# Patient Record
Sex: Male | Born: 1935 | Race: White | Hispanic: No | Smoking: Former smoker
Health system: Southern US, Community
[De-identification: ages and names within clinical notes are randomized; demographics above are authoritative.]

## PROBLEM LIST (undated history)

## (undated) DIAGNOSIS — R011 Cardiac murmur, unspecified: Secondary | ICD-10-CM

## (undated) DIAGNOSIS — I700 Atherosclerosis of aorta: Secondary | ICD-10-CM

## (undated) DIAGNOSIS — C4490 Unspecified malignant neoplasm of skin, unspecified: Secondary | ICD-10-CM

## (undated) DIAGNOSIS — I77810 Thoracic aortic ectasia: Secondary | ICD-10-CM

## (undated) DIAGNOSIS — H3412 Central retinal artery occlusion, left eye: Secondary | ICD-10-CM

## (undated) DIAGNOSIS — I480 Paroxysmal atrial fibrillation: Secondary | ICD-10-CM

## (undated) DIAGNOSIS — I4510 Unspecified right bundle-branch block: Secondary | ICD-10-CM

## (undated) DIAGNOSIS — I509 Heart failure, unspecified: Secondary | ICD-10-CM

## (undated) DIAGNOSIS — U071 COVID-19: Secondary | ICD-10-CM

## (undated) DIAGNOSIS — J690 Pneumonitis due to inhalation of food and vomit: Secondary | ICD-10-CM

## (undated) DIAGNOSIS — I517 Cardiomegaly: Secondary | ICD-10-CM

## (undated) DIAGNOSIS — Z7952 Long term (current) use of systemic steroids: Secondary | ICD-10-CM

## (undated) DIAGNOSIS — I739 Peripheral vascular disease, unspecified: Secondary | ICD-10-CM

## (undated) DIAGNOSIS — N1831 Chronic kidney disease, stage 3a: Secondary | ICD-10-CM

## (undated) DIAGNOSIS — I2510 Atherosclerotic heart disease of native coronary artery without angina pectoris: Secondary | ICD-10-CM

## (undated) DIAGNOSIS — M869 Osteomyelitis, unspecified: Secondary | ICD-10-CM

## (undated) DIAGNOSIS — T8859XA Other complications of anesthesia, initial encounter: Secondary | ICD-10-CM

## (undated) DIAGNOSIS — J449 Chronic obstructive pulmonary disease, unspecified: Secondary | ICD-10-CM

## (undated) DIAGNOSIS — I10 Essential (primary) hypertension: Secondary | ICD-10-CM

## (undated) DIAGNOSIS — E785 Hyperlipidemia, unspecified: Secondary | ICD-10-CM

## (undated) DIAGNOSIS — J1282 Pneumonia due to coronavirus disease 2019: Secondary | ICD-10-CM

## (undated) DIAGNOSIS — Z9981 Dependence on supplemental oxygen: Secondary | ICD-10-CM

## (undated) DIAGNOSIS — I6521 Occlusion and stenosis of right carotid artery: Secondary | ICD-10-CM

## (undated) DIAGNOSIS — A419 Sepsis, unspecified organism: Secondary | ICD-10-CM

## (undated) DIAGNOSIS — Z7901 Long term (current) use of anticoagulants: Secondary | ICD-10-CM

## (undated) DIAGNOSIS — I5030 Unspecified diastolic (congestive) heart failure: Secondary | ICD-10-CM

## (undated) DIAGNOSIS — I4891 Unspecified atrial fibrillation: Secondary | ICD-10-CM

## (undated) HISTORY — PX: OTHER SURGICAL HISTORY: SHX169

## (undated) HISTORY — DX: Heart failure, unspecified: I50.9

## (undated) HISTORY — DX: Chronic obstructive pulmonary disease, unspecified: J44.9

---

## 1998-01-28 HISTORY — PX: OTHER SURGICAL HISTORY: SHX169

## 1999-12-04 ENCOUNTER — Encounter: Payer: Self-pay | Admitting: *Deleted

## 1999-12-05 ENCOUNTER — Encounter: Payer: Self-pay | Admitting: *Deleted

## 1999-12-05 ENCOUNTER — Inpatient Hospital Stay (HOSPITAL_COMMUNITY): Admission: RE | Admit: 1999-12-05 | Discharge: 1999-12-11 | Payer: Self-pay | Admitting: *Deleted

## 1999-12-06 ENCOUNTER — Encounter: Payer: Self-pay | Admitting: *Deleted

## 1999-12-08 ENCOUNTER — Encounter: Payer: Self-pay | Admitting: *Deleted

## 2003-01-29 HISTORY — PX: EYE SURGERY: SHX253

## 2003-01-29 HISTORY — PX: CATARACT EXTRACTION, BILATERAL: SHX1313

## 2004-09-03 ENCOUNTER — Ambulatory Visit: Payer: Self-pay | Admitting: Ophthalmology

## 2004-09-10 ENCOUNTER — Ambulatory Visit: Payer: Self-pay | Admitting: Ophthalmology

## 2005-01-15 ENCOUNTER — Other Ambulatory Visit: Payer: Self-pay

## 2005-01-15 ENCOUNTER — Ambulatory Visit: Payer: Self-pay | Admitting: Ophthalmology

## 2005-01-23 ENCOUNTER — Ambulatory Visit: Payer: Self-pay | Admitting: Ophthalmology

## 2005-01-28 HISTORY — PX: COLONOSCOPY: SHX174

## 2007-09-24 ENCOUNTER — Ambulatory Visit: Payer: Self-pay | Admitting: Unknown Physician Specialty

## 2011-06-25 ENCOUNTER — Emergency Department: Payer: Self-pay | Admitting: Emergency Medicine

## 2011-06-25 LAB — CK TOTAL AND CKMB (NOT AT ARMC)
CK, Total: 138 U/L (ref 35–232)
CK-MB: 2.8 ng/mL (ref 0.5–3.6)

## 2011-06-25 LAB — COMPREHENSIVE METABOLIC PANEL
Albumin: 4.3 g/dL (ref 3.4–5.0)
Alkaline Phosphatase: 80 U/L (ref 50–136)
Anion Gap: 10 (ref 7–16)
BUN: 24 mg/dL — ABNORMAL HIGH (ref 7–18)
Bilirubin,Total: 0.5 mg/dL (ref 0.2–1.0)
Calcium, Total: 9.6 mg/dL (ref 8.5–10.1)
Chloride: 99 mmol/L (ref 98–107)
Co2: 26 mmol/L (ref 21–32)
Creatinine: 1.08 mg/dL (ref 0.60–1.30)
EGFR (African American): >60
EGFR (Non-African Amer.): >60
Glucose: 113 mg/dL — ABNORMAL HIGH (ref 65–99)
Osmolality: 275 (ref 275–301)
Potassium: 4 mmol/L (ref 3.5–5.1)
SGOT(AST): 32 U/L (ref 15–37)
SGPT (ALT): 23 U/L
Sodium: 135 mmol/L — ABNORMAL LOW (ref 136–145)
Total Protein: 7.8 g/dL (ref 6.4–8.2)

## 2011-06-25 LAB — CBC
HCT: 39.7 % — ABNORMAL LOW (ref 40.0–52.0)
HGB: 13.1 g/dL (ref 13.0–18.0)
MCH: 31.3 pg (ref 26.0–34.0)
MCHC: 33.1 g/dL (ref 32.0–36.0)
MCV: 95 fL (ref 80–100)
Platelet: 256 10*3/uL (ref 150–440)
RBC: 4.2 10*6/uL — ABNORMAL LOW (ref 4.40–5.90)
RDW: 13 % (ref 11.5–14.5)
WBC: 15.7 10*3/uL — ABNORMAL HIGH (ref 3.8–10.6)

## 2011-06-25 LAB — TROPONIN I: Troponin-I: <0.02 ng/mL

## 2014-01-29 ENCOUNTER — Inpatient Hospital Stay: Payer: Self-pay

## 2014-01-29 LAB — TROPONIN I
Troponin-I: 0.07 ng/mL — ABNORMAL HIGH
Troponin-I: 0.07 ng/mL — ABNORMAL HIGH
Troponin-I: 0.06 ng/mL — ABNORMAL HIGH

## 2014-01-29 LAB — CBC
HCT: 36 % — ABNORMAL LOW (ref 40.0–52.0)
HGB: 11.9 g/dL — ABNORMAL LOW (ref 13.0–18.0)
MCH: 31.7 pg (ref 26.0–34.0)
MCHC: 33.1 g/dL (ref 32.0–36.0)
MCV: 96 fL (ref 80–100)
Platelet: 218 10*3/uL (ref 150–440)
RBC: 3.76 10*6/uL — ABNORMAL LOW (ref 4.40–5.90)
RDW: 12.9 % (ref 11.5–14.5)
WBC: 13.2 10*3/uL — ABNORMAL HIGH (ref 3.8–10.6)

## 2014-01-29 LAB — COMPREHENSIVE METABOLIC PANEL
ALBUMIN: 3.1 g/dL — AB (ref 3.4–5.0)
ANION GAP: 11 (ref 7–16)
Alkaline Phosphatase: 79 U/L
BILIRUBIN TOTAL: 0.7 mg/dL (ref 0.2–1.0)
BUN: 36 mg/dL — AB (ref 7–18)
CHLORIDE: 102 mmol/L (ref 98–107)
CREATININE: 1.19 mg/dL (ref 0.60–1.30)
Calcium, Total: 8.6 mg/dL (ref 8.5–10.1)
Co2: 25 mmol/L (ref 21–32)
EGFR (Non-African Amer.): >60
Glucose: 149 mg/dL — ABNORMAL HIGH (ref 65–99)
Osmolality: 287 (ref 275–301)
POTASSIUM: 3.6 mmol/L (ref 3.5–5.1)
SGOT(AST): 39 U/L — ABNORMAL HIGH (ref 15–37)
SGPT (ALT): 27 U/L
SODIUM: 138 mmol/L (ref 136–145)
TOTAL PROTEIN: 7.5 g/dL (ref 6.4–8.2)

## 2014-01-29 LAB — CK TOTAL AND CKMB (NOT AT ARMC)
CK, Total: 305 U/L (ref 39–308)
CK-MB: 2.6 ng/mL (ref 0.5–3.6)

## 2014-01-29 LAB — PRO B NATRIURETIC PEPTIDE: B-Type Natriuretic Peptide: 8266 pg/mL — ABNORMAL HIGH (ref 0–450)

## 2014-01-29 LAB — PROTIME-INR
INR: 1.2
Prothrombin Time: 14.7 secs (ref 11.5–14.7)

## 2014-01-29 LAB — MAGNESIUM: MAGNESIUM: 2.1 mg/dL

## 2014-01-29 LAB — TSH: THYROID STIMULATING HORM: 2.67 u[IU]/mL

## 2014-01-29 LAB — APTT: Activated PTT: 30.2 secs (ref 23.6–35.9)

## 2014-01-29 LAB — HEPARIN LEVEL (UNFRACTIONATED): Anti-Xa(Unfractionated): 0.23 IU/mL — ABNORMAL LOW (ref 0.30–0.70)

## 2014-01-30 LAB — CBC WITH DIFFERENTIAL/PLATELET
BASOS ABS: 0 10*3/uL (ref 0.0–0.1)
BASOS PCT: 0.1 %
Eosinophil #: 0 10*3/uL (ref 0.0–0.7)
Eosinophil %: 0 %
HCT: 35.2 % — ABNORMAL LOW (ref 40.0–52.0)
HGB: 11.6 g/dL — ABNORMAL LOW (ref 13.0–18.0)
LYMPHS PCT: 9.1 %
Lymphocyte #: 1 10*3/uL (ref 1.0–3.6)
MCH: 31.6 pg (ref 26.0–34.0)
MCHC: 33.1 g/dL (ref 32.0–36.0)
MCV: 96 fL (ref 80–100)
MONOS PCT: 11.4 %
Monocyte #: 1.2 x10 3/mm — ABNORMAL HIGH (ref 0.2–1.0)
Neutrophil #: 8.3 10*3/uL — ABNORMAL HIGH (ref 1.4–6.5)
Neutrophil %: 79.4 %
Platelet: 242 10*3/uL (ref 150–440)
RBC: 3.68 10*6/uL — AB (ref 4.40–5.90)
RDW: 13 % (ref 11.5–14.5)
WBC: 10.5 10*3/uL (ref 3.8–10.6)

## 2014-01-30 LAB — BASIC METABOLIC PANEL
ANION GAP: 8 (ref 7–16)
BUN: 36 mg/dL — AB (ref 7–18)
CHLORIDE: 102 mmol/L (ref 98–107)
CREATININE: 1.07 mg/dL (ref 0.60–1.30)
Calcium, Total: 8.5 mg/dL (ref 8.5–10.1)
Co2: 28 mmol/L (ref 21–32)
GLUCOSE: 144 mg/dL — AB (ref 65–99)
OSMOLALITY: 287 (ref 275–301)
Potassium: 3.3 mmol/L — ABNORMAL LOW (ref 3.5–5.1)
Sodium: 138 mmol/L (ref 136–145)

## 2014-01-30 LAB — LIPID PANEL
CHOLESTEROL: 115 mg/dL (ref 0–200)
HDL: 29 mg/dL — AB (ref 40–60)
LDL CHOLESTEROL, CALC: 68 mg/dL (ref 0–100)
Triglycerides: 92 mg/dL (ref 0–200)
VLDL CHOLESTEROL, CALC: 18 mg/dL (ref 5–40)

## 2014-01-30 LAB — HEPARIN LEVEL (UNFRACTIONATED): ANTI-XA(UNFRACTIONATED): 0.33 [IU]/mL (ref 0.30–0.70)

## 2014-01-31 LAB — CBC WITH DIFFERENTIAL/PLATELET
BASOS PCT: 0.1 %
Basophil #: 0 10*3/uL (ref 0.0–0.1)
EOS ABS: 0 10*3/uL (ref 0.0–0.7)
Eosinophil %: 0 %
HCT: 34 % — AB (ref 40.0–52.0)
HGB: 11.2 g/dL — ABNORMAL LOW (ref 13.0–18.0)
LYMPHS ABS: 1.6 10*3/uL (ref 1.0–3.6)
LYMPHS PCT: 12.7 %
MCH: 31.1 pg (ref 26.0–34.0)
MCHC: 32.8 g/dL (ref 32.0–36.0)
MCV: 95 fL (ref 80–100)
Monocyte #: 1.5 x10 3/mm — ABNORMAL HIGH (ref 0.2–1.0)
Monocyte %: 12.2 %
NEUTROS ABS: 9.2 10*3/uL — AB (ref 1.4–6.5)
Neutrophil %: 75 %
Platelet: 283 10*3/uL (ref 150–440)
RBC: 3.59 10*6/uL — ABNORMAL LOW (ref 4.40–5.90)
RDW: 13.3 % (ref 11.5–14.5)
WBC: 12.3 10*3/uL — ABNORMAL HIGH (ref 3.8–10.6)

## 2014-01-31 LAB — BASIC METABOLIC PANEL
ANION GAP: 8 (ref 7–16)
BUN: 46 mg/dL — ABNORMAL HIGH (ref 7–18)
CO2: 30 mmol/L (ref 21–32)
CREATININE: 1.05 mg/dL (ref 0.60–1.30)
Calcium, Total: 8.4 mg/dL — ABNORMAL LOW (ref 8.5–10.1)
Chloride: 101 mmol/L (ref 98–107)
EGFR (African American): >60
EGFR (Non-African Amer.): >60
Glucose: 118 mg/dL — ABNORMAL HIGH (ref 65–99)
OSMOLALITY: 291 (ref 275–301)
Potassium: 3 mmol/L — ABNORMAL LOW (ref 3.5–5.1)
Sodium: 139 mmol/L (ref 136–145)

## 2014-01-31 LAB — HEPARIN LEVEL (UNFRACTIONATED): ANTI-XA(UNFRACTIONATED): 0.27 [IU]/mL — AB (ref 0.30–0.70)

## 2014-01-31 LAB — POTASSIUM: Potassium: 3.5 mmol/L (ref 3.5–5.1)

## 2014-01-31 LAB — MAGNESIUM: Magnesium: 2.2 mg/dL

## 2014-02-01 LAB — BASIC METABOLIC PANEL
ANION GAP: 6 — AB (ref 7–16)
BUN: 41 mg/dL — ABNORMAL HIGH (ref 7–18)
CHLORIDE: 102 mmol/L (ref 98–107)
CO2: 32 mmol/L (ref 21–32)
CREATININE: 1.05 mg/dL (ref 0.60–1.30)
Calcium, Total: 8.7 mg/dL (ref 8.5–10.1)
EGFR (Non-African Amer.): >60
Glucose: 108 mg/dL — ABNORMAL HIGH (ref 65–99)
OSMOLALITY: 290 (ref 275–301)
Potassium: 3.5 mmol/L (ref 3.5–5.1)
Sodium: 140 mmol/L (ref 136–145)

## 2014-02-01 LAB — EXPECTORATED SPUTUM ASSESSMENT W REFEX TO RESP CULTURE

## 2014-02-02 LAB — BASIC METABOLIC PANEL
Anion Gap: 7 (ref 7–16)
BUN: 33 mg/dL — ABNORMAL HIGH (ref 7–18)
CREATININE: 0.86 mg/dL (ref 0.60–1.30)
Calcium, Total: 8.7 mg/dL (ref 8.5–10.1)
Chloride: 104 mmol/L (ref 98–107)
Co2: 29 mmol/L (ref 21–32)
EGFR (African American): >60
Glucose: 160 mg/dL — ABNORMAL HIGH (ref 65–99)
Osmolality: 290 (ref 275–301)
Potassium: 3.7 mmol/L (ref 3.5–5.1)
Sodium: 140 mmol/L (ref 136–145)

## 2014-02-03 ENCOUNTER — Encounter: Payer: Self-pay | Admitting: Internal Medicine

## 2014-02-03 LAB — CULTURE, BLOOD (SINGLE)

## 2014-02-28 ENCOUNTER — Encounter: Payer: Self-pay | Admitting: Internal Medicine

## 2014-03-29 ENCOUNTER — Encounter
Admit: 2014-03-29 | Disposition: A | Discharge status: Home / Self Care | Payer: Self-pay | Attending: Internal Medicine | Admitting: Internal Medicine

## 2014-04-29 ENCOUNTER — Encounter
Admit: 2014-04-29 | Disposition: A | Discharge status: Home / Self Care | Payer: Self-pay | Attending: Internal Medicine | Admitting: Internal Medicine

## 2014-05-25 NOTE — H&P (Signed)
PATIENT NAME:  [name], [name] MR#:  [id number] DATE OF BIRTH:  [birth year]  DATE OF ADMISSION:  01/29/2014  PRIMARY CARE PHYSICIAN: John B. Sarina Ser, MD   CHIEF COMPLAINT: Palpitations.   HISTORY OF PRESENT ILLNESS: This is an 81 year old man who has been having a runny nose for a few days, been taking some allergy pills, loratadine. He noticed his heart rate becoming faster. He has been short of breath for lying down and walking for the last 2 days and 2 nights. He had a temperature of 100.3, decreased appetite, not drinking much. He had a slight episode of chest pain a few days ago, but did not last long, only seconds at a time. In the ER, he was found to be rapid atrial fibrillation at 150 beats per minute. He normally takes metoprolol at home but did not take it this morning. Chest x-ray looked like fluid in the lungs and an elevated BNP. Hospitalist services were contacted for further evaluation.   PAST MEDICAL HISTORY: Hypertension, peripheral vascular disease, hyperlipidemia.   PAST SURGICAL HISTORY: A vascular procedure in the abdomen, likely some sort of bypass surgery.   ALLERGIES: No known drug allergies.   MEDICATIONS: Include amlodipine 10 mg daily, aspirin 81 mg daily, metoprolol ER 50 mg daily, simvastatin 40 mg at bedtime.   SOCIAL HISTORY: Quit smoking in 1996, smoked all of his life prior to that. No alcohol. No drug use. Used to work at [employer], and [employer].   FAMILY HISTORY: Both parents died at an elderly age, father in his late 38s or 70s and mother at age 68, unknown cause for both of them. He thinks that they just died of old age.   REVIEW OF SYSTEMS: CONSTITUTIONAL: Positive for fever and sweating last night. No chills. No weight loss. No weight gain. Does have fatigue.  EARS, NOSE, MOUTH AND THROAT: Positive for runny nose. Positive for sore throat.  CARDIOVASCULAR: No chest pain. Positive for palpitations.  RESPIRATORY: Positive for shortness of  breath, coughing, yellow phlegm.  GASTROINTESTINAL: No nausea. No vomiting. No abdominal pain. No diarrhea. No constipation. No bright red blood per rectum. No melena.  GENITOURINARY: No burning on urination. No hematuria.  MUSCULOSKELETAL: No joint pain or muscle pain.  INTEGUMENT: No rashes. Occasional itching.  NEUROLOGIC: No fainting or blackouts.  PSYCHIATRIC: No anxiety or depression.  ENDOCRINE: No thyroid problems.  HEMATOLOGIC AND LYMPHATIC: No anemia. No easy bruising or bleeding.   PHYSICAL EXAMINATION: VITAL SIGNS: On presentation included a temperature of 100.3, pulse 148, respirations 28, blood pressure 116/100, pulse oximetry 83% on room air.  GENERAL: Slight respiratory distress, using accessory muscles.  EYES: Conjunctivae and lids normal. Pupils equal, round, and reactive to light. Extraocular muscles intact. No nystagmus.  EARS, NOSE, MOUTH AND THROAT: Tympanic membranes, no erythema. Nasal mucosa, no erythema. Throat no erythema. No exudate seen. Lips and gums, no lesions.  NECK: No JVD. No bruits. No lymphadenopathy. No thyromegaly. No thyroid nodules palpated.  RESPIRATORY: Decreased breath sounds bilaterally. Positive use of accessory muscles to breathe, poor air entry. Positive rales halfway up lung field.  CARDIOVASCULAR SYSTEM: S1, S2 irregularly irregular, tachycardic. No gallops or rubs heard. A 2/6 systolic ejection murmur. Carotid upstroke 2+ bilaterally. No bruits.  EXTREMITIES: Dorsalis pedis pulses 1+ bilaterally, 2+ edema bilateral lower extremities.  ABDOMEN: Soft, nontender. No organomegaly/splenomegaly. Normoactive bowel sounds. No masses felt.  LYMPHATIC: No lymph nodes in the neck.  MUSCULOSKELETAL: No clubbing, 2+ edema, no cyanosis, on  oxygen.  SKIN: No ulcers or lesions seen.  NEUROLOGICAL: Cranial nerves II through XII grossly intact. Deep tendon reflexes 2+ bilateral lower extremities.  PSYCHIATRIC: The patient is oriented to person, place, and  time.   LABORATORY AND RADIOLOGICAL DATA: Magnesium 2.1, BNP 8266, glucose 149, BUN 36, creatinine 1.19, sodium 138, potassium 3.6, chloride 102, CO2 of 25, calcium 8.6. Liver function tests: AST slightly elevated at 39. White blood cell count 13.2, hemoglobin 11.9 and hematocrit of 36.0, platelet count of 218,000. Troponin borderline at 0.07. Chest x-ray: New coarse bilateral edema versus infiltrates. PH 7.47. EKG: Rapid atrial fibrillation, 150 beats per minute, right bundle branch block, flipped T waves laterally.   ASSESSMENT AND PLAN: 1.  Rapid atrial fibrillation. The patient received two IV doses of Cardizem push and started on Cardizem drip 5 mg per hour. I will also give metoprolol 50 mg stat and increase to b.i.d. We will admit to the Critical Care Unit stepdown for further evaluation of heart rate. The  patient will also be started on heparin drip initially. Cardiology consultation by Dr. Ubaldo Glassing to be obtained and will also obtain an echocardiogram.  2.  Likely congestive heart failure from atrial fibrillation. Echocardiogram to determine structural issues of the heart and ejection fraction. The patient is on metoprolol already. Will give one dose of Lasix at this point since I think this is all heart rate related.  3.  Acute respiratory failure, likely a combination of congestive heart failure, possible infection or pneumonia. We will give oxygen supplementation, pulse oximetry 83% on room air.  4.  Possible pneumonia. We will give Levaquin IV and continue on a daily basis. Get a sputum culture. The patient is in the Intensive Care Unit only for heart rate control not for pneumonia, so I think Levaquin will be perfect coverage.  5.  Hyperlipidemia. Continue simvastatin.  6.  Elevated troponin. I think this is demand ischemia from rapid heart rate.   TIME SPENT ON ADMISSION: 55 minutes.   CODE STATUS: The patient is a full code.   DISPOSITION: The patient will be admitted to the CCU stepdown  secondary to a rapid heart rate.    ____________________________ Tana Conch. Leslye Peer, MD rjw:at D: 01/29/2014 11:49:30 ET T: 01/29/2014 12:01:03 ET JOB#: [id number]  cc: Tana Conch. Leslye Peer, MD, <Dictator> John B. Sarina Ser, MD Marisue Brooklyn MD ELECTRONICALLY SIGNED 02/06/2014 15:13

## 2014-05-29 NOTE — Discharge Summary (Signed)
PATIENT NAME:  [name], [name] MR#:  [id number] DATE OF BIRTH:  09-11-1928  DATE OF ADMISSION:  01/29/2014 DATE OF DISCHARGE:  02/02/2014  PRIMARY CARE PHYSICIAN: John B. Sarina Ser, MD  CONSULTING CARDIOLOGIST: Javier Docker. Ubaldo Glassing, MD  DISCHARGE DIAGNOSES:  1.  Atrial fibrillation with rapid ventricular response, new onset.  2.  Pneumonia.  3.  Hypokalemia.   HISTORY OF PRESENT ILLNESS: Please see initial history and physical for details. Briefly, this is an 81 year old gentleman, with no history of prior atrial fibrillation, who was in his usual state of health when he started to develop a runny nose and cough. He took some allergy pills and then developed a rapid heart rate. He also had a temperature to 100.3. The patient then presented to the ED, where he was found to be in atrial fibrillation with rapid ventricular response with heart rates to 150s. He takes metoprolol as an outpatient.   HOSPITAL COURSE BY ISSUE:  1.  Atrial fibrillation with rapid ventricular response. The patient was admitted to the intensive care unit. He was seen by Dr. Bartholome Bill. He was initially controlled with a diltiazem drip and then started on oral diltiazem 60 mg q. 6 hours, which controlled his heart rate. He remained on his metoprolol 50 mg twice a day. He got 1 dose of diltiazem IV, which helped to stabilize his rate. He will be discharged on oral diltiazem, but add a long-acting form at a dose of 300. He will continue on the metoprolol 50 twice a day. He was started on Eliquis twice a day for anticoagulation.  2.  Possible pneumonia. He had a temperature of 100.3, a white blood count of 13. He did have a sputum culture checked, which was only normal flora. He had a follow-up chest x-ray done on 01/31/2014 which showed airspace opacity in the right midlung concerning for pneumonia. He was treated with levofloxacin and defervesced. His white blood count came to 12.3 on 01/31/2014. He will finish a course of  levofloxacin as an outpatient. He was also placed on a short steroid taper. He does have a history of prior tobacco abuse, but quit several years ago. There may be some underlying COPD as well.   DISCHARGE MEDICATIONS: Please see [facility] physician discharge summary. Briefly, the patient will be in continued on his outpatient metoprolol 50 twice a day. Diltiazem long-acting 300 mg once a day was added for rate control. He is also new on Eliquis 5 mg twice a day. He received a short course of prednisone and levofloxacin. His other medications were unchanged, except we stopped the amlodipine since the diltiazem was added.   DISCHARGE CODE STATUS: Full Code.   DISCHARGE PLAN: The patient will be discharged to a skilled nursing facility. He was seen by rehab, who recommended this. He needs likely about a week of therapy to regain his full function. He does care for his wife at home who has Crohn disease. He is looking into getting her some help. Hopefully he will be able to return home in the next week. He will be discharge on oxygen, 2-3 L. This can be weaned as an outpatient.   HOSPITAL FOLLOWUP: The patient will follow up with Dr. Gilford Rile and Dr. Ubaldo Glassing within 2 weeks.   This discharge took 45 minutes.   ____________________________ Cheral Marker. Ola Spurr, MD dpf:MT D: 02/02/2014 10:13:12 ET T: 02/02/2014 10:29:37 ET JOB#: [id number]  cc: Cheral Marker. Ola Spurr, MD, <Dictator> Halyn Flaugher Ola Spurr MD ELECTRONICALLY SIGNED 02/09/2014 15:40

## 2014-05-29 NOTE — Consult Note (Signed)
Present Illness Patient is an 81 year old male with no prior history of atrial fibrillation who was admitted with irregular rapid heart rate noted to be in atrial fibrillation with rapid ventricular response.  Pre seeding this event, developed an upper respiratory infection for which she tried several over the counter nasal sprays and oral agents.  On arrival emergency room he was noted to be in atrial fibrillation with ventricular response of 140-150.  He was placed on a Cardizem drip with improvement in his rate.  The he is ruled out for myocardial infarction.  He denies chest pain but notes a fullness in his chest when his heart is beating rapidly.  He denies fever chills.  He denies syncope or presyncope.   Physical Exam:  GEN no acute distress   HEENT hearing intact to voice   NECK supple   RESP normal resp effort  no use of accessory muscles  rhonchi   CARD Irregular rate and rhythm  Tachycardic  Murmur   Murmur Systolic   Systolic Murmur axilla   ABD denies tenderness  no Adominal Mass   LYMPH negative neck   EXTR negative cyanosis/clubbing   SKIN normal to palpation   NEURO cranial nerves intact, motor/sensory function intact   PSYCH A+O to time, place, person   Review of Systems:  Subjective/Chief Complaint Shortness of breath with cough and congestion and related fatigue   General: No Complaints  Fatigue   Skin: No Complaints   ENT: No Complaints   Eyes: No Complaints   Neck: No Complaints   Respiratory: Frequent cough  Short of breath  Sputum   Cardiovascular: Tightness  Palpitations   Gastrointestinal: No Complaints   Genitourinary: No Complaints   Vascular: No Complaints   Musculoskeletal: No Complaints   Neurologic: No Complaints   Hematologic: No Complaints   Psychiatric: No Complaints   Review of Systems: All other systems were reviewed and found to be negative   Medications/Allergies Reviewed Medications/Allergies reviewed   Family  & Social History:  Family and Social History:  Family History Non-Contributory   EKG:  Interpretation atrial fibrillation with variable ventricular response    No Known Allergies:    Impression 81 year old male with hypertension but no other cardiovascular abnormalities to was admitted with an upper rest for infection and was noted to be in atrial fibrillation with rapid ventricular response.  He has never had this problem before.  His rate was controlled with IV  Cardizem.  He has ruled out for myocardial infarction.  Chest x-ray reveals no acute process.  Echocardiogram reveals preserved left ventricular function with no high-grade valvular abnormalities.  He etiology of his atrial fibrillation is likely multifactorial to include mild upper respiratory infection.  He is currently stable.  Will convert to oral Cardizem and attempt to transfer to telemetry.  The patient's chads score is 2 for hypertension and age.  Will need to discuss chronic anticoagulation prior to discharge.  Would recommend novel anticoagulants if possible.   Plan 1. Will convert from IV Cardizem to p.o. at 60 mg p.o. q.6 and continue his metoprolol 2. Continue to  treat his upper respiratory symptoms 3. Will add Eliquis at 5 mg twice daily 4. ambulate and discharge if rate remains stable on this regimen. 5. Will follow as outpatient for consideration further treatment of his atrial fibrillation the.   Electronic Signatures: Teodoro Spray (MD)  (Signed 04-Jan-16 06:28)  Authored: General Aspect/Present Illness, History and Physical Exam, Review of System, Family &  Social History, EKG , Allergies, Impression/Plan   Last Updated: 04-Jan-16 06:28 by Teodoro Spray (MD)

## 2014-09-19 ENCOUNTER — Other Ambulatory Visit: Payer: Self-pay

## 2014-09-19 ENCOUNTER — Emergency Department
Admission: EM | Admit: 2014-09-19 | Discharge: 2014-09-19 | Disposition: A | Discharge status: Home / Self Care | Payer: Medicare Other | Attending: Emergency Medicine | Admitting: Emergency Medicine

## 2014-09-19 DIAGNOSIS — Z4800 Encounter for change or removal of nonsurgical wound dressing: Secondary | ICD-10-CM | POA: Diagnosis present

## 2014-09-19 DIAGNOSIS — I83891 Varicose veins of right lower extremities with other complications: Secondary | ICD-10-CM | POA: Insufficient documentation

## 2014-09-19 LAB — CBC
HEMATOCRIT: 33.4 % — AB (ref 40.0–52.0)
HEMOGLOBIN: 10.9 g/dL — AB (ref 13.0–18.0)
MCH: 30.3 pg (ref 26.0–34.0)
MCHC: 32.7 g/dL (ref 32.0–36.0)
MCV: 92.6 fL (ref 80.0–100.0)
Platelets: 222 10*3/uL (ref 150–440)
RBC: 3.6 MIL/uL — ABNORMAL LOW (ref 4.40–5.90)
RDW: 15.5 % — AB (ref 11.5–14.5)
WBC: 12 10*3/uL — ABNORMAL HIGH (ref 3.8–10.6)

## 2014-09-19 LAB — PROTIME-INR
INR: 2.75
Prothrombin Time: 29.2 seconds — ABNORMAL HIGH (ref 11.4–15.0)

## 2014-09-19 MED ORDER — SILVER NITRATE-POT NITRATE 75-25 % EX MISC
CUTANEOUS | Status: AC
  Administered 2014-09-19: 17:00:00
  Filled 2014-09-19: qty 13

## 2014-09-19 NOTE — Discharge Instructions (Signed)
Bleeding Varicose Veins Varicose veins are veins that have become enlarged and twisted. Valves in the veins help return blood from the leg to the heart. If these valves are damaged, blood flows backwards and backs up into the veins in the leg near the skin. This causes the veins to become larger because of increased pressure within. Sometimes these veins bleed. CAUSES  Factors that can lead to bleeding varicose veins include:  Thinning of the skin that covers the veins. This skin is stretched as the veins enlarge.  Weak and thinning walls of the varicose veins. These thin walls are part of the reason why blood is not flowing normally to the heart.  Having high pressure in the veins. This high pressure occurs because the blood is not flowing freely back up to the heart.  Injury. Even a small injury to a varicose vein can cause bleeding.  Open wounds. A sore may develop near a varicose vein and not heal. This makes bleeding more likely.  Taking medicine that thins the blood. These medicines may include aspirin, anti-inflammatory medicine, and other blood thinners. SYMPTOMS  If bleeding is on the outside surface of the skin, blood can be seen. Sometimes, the bleeding stays under the skin. If this happens, the blue or purple area will spread beyond the vein. This discoloration may be visible. DIAGNOSIS  To decide if you have a bleeding varicose vein, your caregiver may:  Ask about your symptoms. This will include when you first saw bleeding.  Ask about how long you have had varicose veins and if they cause you problems.  Ask about your overall health.  Ask about possible causes, like recent cuts or if the area near the varicose veins was bumped or injured.  Examine the skin or leg that concerns you. Your caregiver will probably feel the veins.  Order imaging tests. These create detailed pictures of the veins. TREATMENT  The first goal of treating bleeding varicose veins is to stop the  bleeding. Then, the aim is to keep any bleeding from happening again. Treatment will depend on the cause of the bleeding and how bad it is. Ask your caregiver about what would be best for you. Options include:  Raising (elevating) your leg. Lie down with your leg propped up on a pillow or cushion. Your foot should be above your heart.  Applying pressure to the spot that is bleeding. The bleeding should stop in a short time.  Wearing elastic stocking that "compress" your legs (compression stockings). An elastic bandage may do the same thing.  Applying an antibiotic cream on sores that are not healing.  Surgically removing or closing off the bleeding varicose veins. HOME CARE INSTRUCTIONS   Apply any creams that your caregiver prescribed. Follow the directions carefully.  Wear compression stockings or any special wraps that were prescribed. Make sure you know:  If you should wear them every day.  How long you should wear them.  If veins were removed or closed, a bandage (dressing) will probably cover the area. Make sure you know:  How often the dressing should be changed.  Whether the area can get wet.  When you can leave the skin uncovered.  Check your skin every day. Look for new sores and signs of bleeding.  To prevent future bleeding:  Use extra care in situations where you could cut your legs. Shaving, for example, or working outside in the garden.  Try to keep your legs elevated as much as possible. Lie down  when you can. SEEK MEDICAL CARE IF:   You have any questions about how to wear compression stockings or elastic bandages.  Your veins continue to bleed.  Sores develop near your varicose veins.  You have a sore that does not heal or gets bigger.  Pain increases in your leg.  The area around a varicose vein becomes warm, red, or tender to the touch.  You notice a yellowish fluid that smells bad coming from a spot where there was bleeding.  You develop a  fever of more than 100.5 F (38.1 C). SEEK IMMEDIATE MEDICAL CARE IF:   You develop a fever of more than 102 F (38.9 C). Document Released: 06/02/2008 Document Revised: 04/08/2011 Document Reviewed: 05/18/2013 [facility] Patient Information 2015 [place], [place]. This information is not intended to replace advice given to you by your health care provider. Make sure you discuss any questions you have with your health care provider.

## 2014-09-19 NOTE — ED Provider Notes (Signed)
[facility] Emergency Department Provider Note  Time seen: 5:38 PM  I have reviewed the triage vital signs and the nursing notes.   HISTORY  Chief Complaint Wound Check    HPI [name] is a 81 y.o. male who presents the emergency department with right ankle bleeding. According to the patient he has had a varicose vein on the right ankle for years. Today he noted excessive bleeding from the area, he states it was "squirting" several inches from his skin. Patient is on Coumadin. Denies any lightheadedness, dizziness, chest pain. They applied pressure nor able to moderately controlled the bleeding. Upon arrival it is largely hemostatic with a slight ooze of bleeding on the dressing.     No past medical history on file.  There are no active problems to display for this patient.   No past surgical history on file.  No current outpatient prescriptions on file.  Allergies Review of patient's allergies indicates no known allergies.  No family history on file.  Social History Social History  Substance Use Topics  . Smoking status: Not on file  . Smokeless tobacco: Not on file  . Alcohol Use: Not on file    Review of Systems Constitutional: Negative for fever. Cardiovascular: Negative for chest pain. Respiratory: Negative for shortness of breath. Gastrointestinal: Negative for abdominal pain Skin: Bleeding to right ankle. Neurological: Negative for headache 10-point ROS otherwise negative.  ____________________________________________   PHYSICAL EXAM:  VITAL SIGNS: ED Triage Vitals  Enc Vitals Group     BP 09/19/14 1555 154/51 mmHg     Pulse Rate 09/19/14 1558 52     Resp 09/19/14 1558 14     Temp 09/19/14 1558 97.6 F (36.4 C)     Temp Source 09/19/14 1558 Oral     SpO2 09/19/14 1558 99 %     Weight 09/19/14 1557 163 lb (73.936 kg)     Height 09/19/14 1557 5\' 10"  (1.778 m)     Head Cir --      Peak Flow --      Pain Score --       Pain Loc --      Pain Edu? --      Excl. in [place]? --     Constitutional: Alert and oriented. Well appearing and in no distress. Eyes: Normal exam ENT   Head: Normocephalic and atraumatic Cardiovascular: Normal rate, regular rhythm. Respiratory: Normal respiratory effort without tachypnea nor retractions. Breath sounds are clear  Gastrointestinal: Soft and nontender. No distention.   Musculoskeletal: Nontender with normal range of motion in all extremities. Area consistent with a varicose vein to the medial aspect of the right ankle, currently hemostatic. Dried blood present over right foot. Neurologic:  Normal speech and language. No gross focal neurologic deficits  Skin:  Skin is warm, dry and intact.  Psychiatric: Mood and affect are normal. Speech and behavior are normal.   ____________________________________________    EKG  EKG reviewed and interpreted by myself shows sinus bradycardia at 50 bpm, widened QRS, otherwise normal intervals, nonspecific ST changes are present. No ST elevations noted.   INITIAL IMPRESSION / ASSESSMENT AND PLAN / ED COURSE  Pertinent labs & imaging results that were available during my care of the patient were reviewed by me and considered in my medical decision making (see chart for details).  Patient with varicose vein bleed to right lower extremity. I've applied silver nitrate and Dermabond. Wound remains hemostatic. We have dressed the wound. We  will check a PT/INR as the patient was due for recheck on Wednesday. Patient agreeable to plan.  CBC/INR largely at baseline. We'll discharge patient home with primary follow-up.  ____________________________________________   FINAL CLINICAL IMPRESSION(S) / ED DIAGNOSES  Varicose vein bleed   Harvest Dark, MD 09/19/14 1759

## 2014-09-19 NOTE — ED Notes (Signed)
Has blister on right inner ankle and it started bleeding otday.  Wrapped by ems and bleeding controlled at this time.  On coumadin.  Per ems pt hypotensive at scene.

## 2015-11-14 ENCOUNTER — Other Ambulatory Visit: Payer: Self-pay | Admitting: Internal Medicine

## 2015-11-14 DIAGNOSIS — J189 Pneumonia, unspecified organism: Secondary | ICD-10-CM

## 2015-11-29 ENCOUNTER — Ambulatory Visit
Admission: RE | Admit: 2015-11-29 | Discharge: 2015-11-29 | Disposition: A | Discharge status: Home / Self Care | Payer: Medicare Other | Source: Ambulatory Visit | Attending: Internal Medicine | Admitting: Internal Medicine

## 2015-11-29 DIAGNOSIS — J90 Pleural effusion, not elsewhere classified: Secondary | ICD-10-CM | POA: Diagnosis not present

## 2015-11-29 DIAGNOSIS — J439 Emphysema, unspecified: Secondary | ICD-10-CM | POA: Insufficient documentation

## 2015-11-29 DIAGNOSIS — J189 Pneumonia, unspecified organism: Secondary | ICD-10-CM | POA: Diagnosis present

## 2015-11-29 DIAGNOSIS — I700 Atherosclerosis of aorta: Secondary | ICD-10-CM | POA: Insufficient documentation

## 2015-11-29 DIAGNOSIS — I2510 Atherosclerotic heart disease of native coronary artery without angina pectoris: Secondary | ICD-10-CM | POA: Diagnosis not present

## 2015-12-04 ENCOUNTER — Other Ambulatory Visit: Payer: Self-pay | Admitting: Internal Medicine

## 2015-12-04 DIAGNOSIS — J984 Other disorders of lung: Secondary | ICD-10-CM

## 2017-06-24 ENCOUNTER — Encounter: Payer: Self-pay | Admitting: Emergency Medicine

## 2017-06-24 ENCOUNTER — Emergency Department
Admission: EM | Admit: 2017-06-24 | Discharge: 2017-06-24 | Disposition: A | Discharge status: Home / Self Care | Payer: Medicare Other | Attending: Student in an Organized Health Care Education/Training Program | Admitting: Student in an Organized Health Care Education/Training Program

## 2017-06-24 ENCOUNTER — Other Ambulatory Visit: Payer: Self-pay

## 2017-06-24 DIAGNOSIS — Y939 Activity, unspecified: Secondary | ICD-10-CM | POA: Insufficient documentation

## 2017-06-24 DIAGNOSIS — Y929 Unspecified place or not applicable: Secondary | ICD-10-CM | POA: Diagnosis not present

## 2017-06-24 DIAGNOSIS — I2510 Atherosclerotic heart disease of native coronary artery without angina pectoris: Secondary | ICD-10-CM | POA: Insufficient documentation

## 2017-06-24 DIAGNOSIS — Z79899 Other long term (current) drug therapy: Secondary | ICD-10-CM | POA: Diagnosis not present

## 2017-06-24 DIAGNOSIS — Z87891 Personal history of nicotine dependence: Secondary | ICD-10-CM | POA: Diagnosis not present

## 2017-06-24 DIAGNOSIS — I10 Essential (primary) hypertension: Secondary | ICD-10-CM | POA: Diagnosis not present

## 2017-06-24 DIAGNOSIS — Z7982 Long term (current) use of aspirin: Secondary | ICD-10-CM | POA: Insufficient documentation

## 2017-06-24 DIAGNOSIS — S51812A Laceration without foreign body of left forearm, initial encounter: Secondary | ICD-10-CM | POA: Insufficient documentation

## 2017-06-24 DIAGNOSIS — Z7901 Long term (current) use of anticoagulants: Secondary | ICD-10-CM | POA: Insufficient documentation

## 2017-06-24 DIAGNOSIS — W51XXXA Accidental striking against or bumped into by another person, initial encounter: Secondary | ICD-10-CM | POA: Insufficient documentation

## 2017-06-24 DIAGNOSIS — Y998 Other external cause status: Secondary | ICD-10-CM | POA: Insufficient documentation

## 2017-06-24 HISTORY — DX: Atherosclerotic heart disease of native coronary artery without angina pectoris: I25.10

## 2017-06-24 HISTORY — DX: Essential (primary) hypertension: I10

## 2017-06-24 NOTE — ED Provider Notes (Signed)
[facility] Emergency Department Provider Note   ____________________________________________   First MD Initiated Contact with Patient 06/24/17 1355     (approximate)  I have reviewed the triage vital signs and the nursing notes.   HISTORY  Chief Complaint Laceration    HPI [name] is a 84 y.o. male patient presents skin anterior to left forearm which occurred yesterday.  Patient states wife fell fell into him while using a walker.  Patient had bleeding was controlled with direct pressure.  Patient denies loss of sensation or loss of function of the left upper extremity.  Patient rates the pain as a 4/10.  Patient described the pain is "aching".  Past Medical History:  Diagnosis Date  . Coronary artery disease   . Hypertension     There are no active problems to display for this patient.   History reviewed. No pertinent surgical history.  Prior to Admission medications   Medication Sig Start Date End Date Taking? Authorizing Provider  aspirin EC 81 MG tablet Take 81 mg by mouth daily.   Yes [provider]  atorvastatin (LIPITOR) 20 MG tablet Take 20 mg by mouth daily.   Yes [provider]  diltiazem (CARDIZEM CD) 240 MG 24 hr capsule Take 240 mg by mouth daily.   Yes [provider]  furosemide (LASIX) 40 MG tablet Take 40 mg by mouth.   Yes [provider]  metoprolol tartrate (LOPRESSOR) 50 MG tablet Take 50 mg by mouth 2 (two) times daily.   Yes [provider]  warfarin (COUMADIN) 2 MG tablet Take 2 mg by mouth daily.   Yes [provider]    Allergies Patient has no known allergies.  No family history on file.  Social History Social History   Tobacco Use  . Smoking status: Former Research scientist (life sciences)  . Smokeless tobacco: Never Used  Substance Use Topics  . Alcohol use: Never    Frequency: Never  . Drug use: Never    Review of Systems Constitutional: No fever/chills Eyes:  No visual changes. ENT: No sore throat. Cardiovascular: Denies chest pain. Respiratory: Denies shortness of breath. Gastrointestinal: No abdominal pain.  No nausea, no vomiting.  No diarrhea.  No constipation. Genitourinary: Negative for dysuria. Musculoskeletal: Negative for back pain. Skin: Negative for rash. Neurological: Negative for headaches, focal weakness or numbness. Endocrine:Hypertension    ____________________________________________   PHYSICAL EXAM:  VITAL SIGNS: ED Triage Vitals  Enc Vitals Group     BP 06/24/17 1249 (!) 163/87     Pulse Rate 06/24/17 1249 67     Resp 06/24/17 1249 18     Temp 06/24/17 1249 98.7 F (37.1 C)     Temp Source 06/24/17 1249 Oral     SpO2 06/24/17 1249 97 %     Weight 06/24/17 1252 153 lb (69.4 kg)     Height 06/24/17 1252 5\' 9"  (1.753 m)     Head Circumference --      Peak Flow --      Pain Score 06/24/17 1252 4     Pain Loc --      Pain Edu? --      Excl. in [place]? --     Constitutional: Alert and oriented. Well appearing and in no acute distress. Cardiovascular: Normal rate, regular rhythm. Grossly normal heart sounds.  Good peripheral circulation.  Elevated blood pressure. Respiratory: Normal respiratory effort.  No retractions. Lungs CTAB. Gastrointestinal: Soft and nontender. No distention. No abdominal bruits.  No CVA tenderness. Musculoskeletal: No lower extremity tenderness nor edema.  No joint effusions. Neurologic:  Normal speech and language. No gross focal neurologic deficits are appreciated. No gait instability. Skin: Skin tear left forearm.   Psychiatric: Mood and affect are normal. Speech and behavior are normal.  ____________________________________________   LABS (all labs ordered are listed, but only abnormal results are displayed)  Labs Reviewed - No data to display ____________________________________________  EKG   ____________________________________________  RADIOLOGY  ED MD interpretation:      Official radiology report(s): No results found.  ____________________________________________   PROCEDURES  Procedure(s) performed: None  .Marland KitchenLaceration Repair Date/Time: 06/24/2017 2:18 PM Performed by: Sable Feil, PA-C Authorized by: Sable Feil, PA-C   Consent:    Consent obtained:  Verbal   Consent given by:  Patient   Risks discussed:  Infection, pain and poor cosmetic result Anesthesia (see MAR for exact dosages):    Anesthesia method:  None Laceration details:    Location:  Shoulder/arm   Shoulder/arm location:  L lower arm   Length (cm):  3 Repair type:    Repair type:  Simple Pre-procedure details:    Preparation:  Patient was prepped and draped in usual sterile fashion Exploration:    Hemostasis achieved with:  Direct pressure   Contaminated: no   Treatment:    Area cleansed with:  Betadine and saline   Amount of cleaning:  Standard   Visualized foreign bodies/material removed: no   Skin repair:    Repair method:  Tissue adhesive Approximation:    Approximation:  Close Post-procedure details:    Dressing:  Sterile dressing   Patient tolerance of procedure:  Tolerated well, no immediate complications    Critical Care performed: No  ____________________________________________   INITIAL IMPRESSION / ASSESSMENT AND PLAN / ED COURSE  As part of my medical decision making, I reviewed the following data within the electronic MEDICAL RECORD NUMBER    Skin tear left forearm.  Area was surgical clean and closed with Dermabond.  Patient given discharge care instruction.  Patient advised to return to ED if wound reopens for healing is complete.      ____________________________________________   FINAL CLINICAL IMPRESSION(S) / ED DIAGNOSES  Final diagnoses:  Forearm laceration, left, initial encounter     ED Discharge Orders    None       Note:  This document was prepared using Dragon voice recognition software and may include  unintentional dictation errors.    Sable Feil, PA-C 06/24/17 1420    Merlyn Lot, MD 06/24/17 [phone]

## 2017-06-24 NOTE — ED Triage Notes (Signed)
States his wife fell back onto him  Skin tear to left forearm

## 2017-06-24 NOTE — Discharge Instructions (Signed)
Follow discharge care instruction.  Do not place any ointments or cream on wound.  Do not let adhesive from Band-Aids touch the Dermabond site.

## 2017-06-24 NOTE — ED Notes (Addendum)
FIRST NURSE NOTE:  Pt reports he was helping his wife who was falling and the walker cut his left forearm. Pt has bandaids applied and bleeding controlled at this time.  Pt drove himself here and ambulates independently.

## 2017-06-26 ENCOUNTER — Encounter: Payer: Self-pay | Admitting: Emergency Medicine

## 2017-06-26 ENCOUNTER — Other Ambulatory Visit: Payer: Self-pay

## 2017-06-26 ENCOUNTER — Emergency Department
Admission: EM | Admit: 2017-06-26 | Discharge: 2017-06-26 | Disposition: A | Discharge status: Home / Self Care | Payer: Medicare Other | Attending: Emergency Medicine | Admitting: Emergency Medicine

## 2017-06-26 DIAGNOSIS — Z7901 Long term (current) use of anticoagulants: Secondary | ICD-10-CM | POA: Diagnosis not present

## 2017-06-26 DIAGNOSIS — Z79899 Other long term (current) drug therapy: Secondary | ICD-10-CM | POA: Diagnosis not present

## 2017-06-26 DIAGNOSIS — S41112D Laceration without foreign body of left upper arm, subsequent encounter: Secondary | ICD-10-CM | POA: Insufficient documentation

## 2017-06-26 DIAGNOSIS — X58XXXD Exposure to other specified factors, subsequent encounter: Secondary | ICD-10-CM | POA: Insufficient documentation

## 2017-06-26 DIAGNOSIS — Z87891 Personal history of nicotine dependence: Secondary | ICD-10-CM | POA: Insufficient documentation

## 2017-06-26 DIAGNOSIS — Z7982 Long term (current) use of aspirin: Secondary | ICD-10-CM | POA: Insufficient documentation

## 2017-06-26 DIAGNOSIS — I2510 Atherosclerotic heart disease of native coronary artery without angina pectoris: Secondary | ICD-10-CM | POA: Diagnosis not present

## 2017-06-26 DIAGNOSIS — I10 Essential (primary) hypertension: Secondary | ICD-10-CM | POA: Diagnosis not present

## 2017-06-26 DIAGNOSIS — Z5189 Encounter for other specified aftercare: Secondary | ICD-10-CM

## 2017-06-26 NOTE — ED Triage Notes (Signed)
Pt here yesterday for skin tear to LUE.  Started bleeding today so came for recheck. Is on coumadin. Most recent INR 3.0 mid march per care everywhere

## 2017-06-26 NOTE — Discharge Instructions (Signed)
Advised no adhesive dressings to come in contact with the skin.

## 2017-06-26 NOTE — ED Provider Notes (Signed)
[facility] Emergency Department Provider Note   ____________________________________________   First MD Initiated Contact with Patient 06/26/17 1207     (approximate)  I have reviewed the triage vital signs and the nursing notes.   HISTORY  Chief Complaint Wound Check    HPI [name] is a 84 y.o. male patient here today for follow-up secondary to a skin tear seen in his department yesterday.  Patient was Dermabond prior to departure.  Patient has been placing adhesive bandages on the skin.  Doing dressing changes he is "part of the Dermabond off of the skin.  Mild bleeding with dressing change.  Patient is on a blood thinner.  Patient has lost sensation loss of function of the left upper extremity.  No increased edema or erythema.   Past Medical History:  Diagnosis Date  . Coronary artery disease   . Hypertension     There are no active problems to display for this patient.   History reviewed. No pertinent surgical history.  Prior to Admission medications   Medication Sig Start Date End Date Taking? Authorizing Provider  aspirin EC 81 MG tablet Take 81 mg by mouth daily.    [provider]  atorvastatin (LIPITOR) 20 MG tablet Take 20 mg by mouth daily.    [provider]  diltiazem (CARDIZEM CD) 240 MG 24 hr capsule Take 240 mg by mouth daily.    [provider]  furosemide (LASIX) 40 MG tablet Take 40 mg by mouth.    [provider]  metoprolol tartrate (LOPRESSOR) 50 MG tablet Take 50 mg by mouth 2 (two) times daily.    [provider]  warfarin (COUMADIN) 2 MG tablet Take 2 mg by mouth daily.    [provider]    Allergies Patient has no known allergies.  History reviewed. No pertinent family history.  Social History Social History   Tobacco Use  . Smoking status: Former Research scientist (life sciences)  . Smokeless tobacco: Never Used  Substance Use Topics  . Alcohol use: Never    Frequency:  Never  . Drug use: Never    Review of Systems Constitutional: No fever/chills Eyes: No visual changes. ENT: No sore throat. Cardiovascular: Denies chest pain. Respiratory: Denies shortness of breath. Gastrointestinal: No abdominal pain.  No nausea, no vomiting.  No diarrhea.  No constipation. Genitourinary: Negative for dysuria. Musculoskeletal: Negative for back pain. Skin: Negative for rash. Neurological: Negative for headaches, focal weakness or numbness. Endocrine:Hypertension   ____________________________________________   PHYSICAL EXAM:  VITAL SIGNS: ED Triage Vitals  Enc Vitals Group     BP 06/26/17 1155 (!) 143/77     Pulse Rate 06/26/17 1155 76     Resp 06/26/17 1155 16     Temp 06/26/17 1155 98.3 F (36.8 C)     Temp Source 06/26/17 1155 Oral     SpO2 06/26/17 1155 99 %     Weight 06/26/17 1153 153 lb (69.4 kg)     Height 06/26/17 1153 5\' 9"  (1.753 m)     Head Circumference --      Peak Flow --      Pain Score 06/26/17 1153 5     Pain Loc --      Pain Edu? --      Excl. in [place]? --    Constitutional: Alert and oriented. Well appearing and in no acute distress. Cardiovascular: Normal rate, regular rhythm. Grossly normal heart sounds.  Good peripheral circulation. Respiratory: Normal respiratory effort.  No retractions. Lungs CTAB. Neurologic:  Normal speech and language. No gross focal neurologic deficits are appreciated. No gait instability. Skin: Healing skin tear to the left upper extremity.  Mild bleeding where adhesive bandage has contacted the Dermabond.  Psychiatric: Mood and affect are normal. Speech and behavior are normal.  ____________________________________________   LABS (all labs ordered are listed, but only abnormal results are displayed)  Labs Reviewed - No data to display ____________________________________________  EKG   ____________________________________________  RADIOLOGY  ED MD interpretation:    Official radiology  report(s): No results found.  ____________________________________________   PROCEDURES  Procedure(s) performed: None  Procedures  Critical Care performed: No  ____________________________________________   INITIAL IMPRESSION / ASSESSMENT AND PLAN / ED COURSE  As part of my medical decision making, I reviewed the following data within the electronic MEDICAL RECORD NUMBER    Mild bleeding with dressing change secondary to a skin tear to left upper extremity.  Area was re-clean and re-bandage.  Patient given discharge care instruction and emphasizing not to place any adhesive dressing to the Dermabond area.  Patient advised to follow-up PCP.      ____________________________________________   FINAL CLINICAL IMPRESSION(S) / ED DIAGNOSES  Final diagnoses:  Encounter for wound re-check     ED Discharge Orders    None       Note:  This document was prepared using Dragon voice recognition software and may include unintentional dictation errors.    Christopher Feil, PA-C 06/26/17 1230    Christopher Mew, MD 06/26/17 [phone]

## 2017-06-26 NOTE — ED Notes (Signed)
Wound dressed with telfa and kerlix. Showed pt how to dress wound at home and instructed not to use bandaids on his skin.

## 2018-05-11 ENCOUNTER — Other Ambulatory Visit: Payer: Self-pay | Admitting: Family Medicine

## 2018-05-11 ENCOUNTER — Ambulatory Visit: Payer: Medicare Other

## 2018-05-11 DIAGNOSIS — M79605 Pain in left leg: Secondary | ICD-10-CM

## 2018-05-12 ENCOUNTER — Ambulatory Visit
Admission: RE | Admit: 2018-05-12 | Discharge: 2018-05-12 | Disposition: A | Discharge status: Home / Self Care | Payer: Medicare Other | Source: Ambulatory Visit | Attending: Family Medicine | Admitting: Family Medicine

## 2018-05-12 ENCOUNTER — Other Ambulatory Visit: Payer: Self-pay

## 2018-05-12 DIAGNOSIS — M79605 Pain in left leg: Secondary | ICD-10-CM | POA: Diagnosis present

## 2019-01-04 ENCOUNTER — Emergency Department: Payer: Medicare Other

## 2019-01-04 ENCOUNTER — Encounter: Payer: Self-pay | Admitting: Emergency Medicine

## 2019-01-04 ENCOUNTER — Other Ambulatory Visit: Payer: Self-pay

## 2019-01-04 ENCOUNTER — Inpatient Hospital Stay
Admission: EM | Admit: 2019-01-04 | Discharge: 2019-01-08 | DRG: 177 | Disposition: A | Discharge status: Home / Self Care | Payer: Medicare Other | Attending: Internal Medicine | Admitting: Internal Medicine

## 2019-01-04 DIAGNOSIS — Z79899 Other long term (current) drug therapy: Secondary | ICD-10-CM | POA: Diagnosis not present

## 2019-01-04 DIAGNOSIS — Z7982 Long term (current) use of aspirin: Secondary | ICD-10-CM

## 2019-01-04 DIAGNOSIS — E876 Hypokalemia: Secondary | ICD-10-CM | POA: Diagnosis present

## 2019-01-04 DIAGNOSIS — J9601 Acute respiratory failure with hypoxia: Secondary | ICD-10-CM | POA: Diagnosis present

## 2019-01-04 DIAGNOSIS — I248 Other forms of acute ischemic heart disease: Secondary | ICD-10-CM | POA: Diagnosis present

## 2019-01-04 DIAGNOSIS — R778 Other specified abnormalities of plasma proteins: Secondary | ICD-10-CM | POA: Diagnosis not present

## 2019-01-04 DIAGNOSIS — I4891 Unspecified atrial fibrillation: Secondary | ICD-10-CM | POA: Diagnosis present

## 2019-01-04 DIAGNOSIS — K5904 Chronic idiopathic constipation: Secondary | ICD-10-CM | POA: Diagnosis not present

## 2019-01-04 DIAGNOSIS — Z515 Encounter for palliative care: Secondary | ICD-10-CM | POA: Diagnosis not present

## 2019-01-04 DIAGNOSIS — I10 Essential (primary) hypertension: Secondary | ICD-10-CM | POA: Diagnosis present

## 2019-01-04 DIAGNOSIS — Z7189 Other specified counseling: Secondary | ICD-10-CM | POA: Diagnosis not present

## 2019-01-04 DIAGNOSIS — E785 Hyperlipidemia, unspecified: Secondary | ICD-10-CM | POA: Diagnosis present

## 2019-01-04 DIAGNOSIS — Z87891 Personal history of nicotine dependence: Secondary | ICD-10-CM

## 2019-01-04 DIAGNOSIS — Z7901 Long term (current) use of anticoagulants: Secondary | ICD-10-CM | POA: Diagnosis not present

## 2019-01-04 DIAGNOSIS — I2510 Atherosclerotic heart disease of native coronary artery without angina pectoris: Secondary | ICD-10-CM | POA: Diagnosis present

## 2019-01-04 DIAGNOSIS — U071 COVID-19: Principal | ICD-10-CM

## 2019-01-04 DIAGNOSIS — K5900 Constipation, unspecified: Secondary | ICD-10-CM | POA: Diagnosis present

## 2019-01-04 DIAGNOSIS — R0602 Shortness of breath: Secondary | ICD-10-CM | POA: Diagnosis present

## 2019-01-04 DIAGNOSIS — J449 Chronic obstructive pulmonary disease, unspecified: Secondary | ICD-10-CM | POA: Diagnosis present

## 2019-01-04 DIAGNOSIS — R0902 Hypoxemia: Secondary | ICD-10-CM

## 2019-01-04 DIAGNOSIS — J069 Acute upper respiratory infection, unspecified: Secondary | ICD-10-CM | POA: Diagnosis not present

## 2019-01-04 DIAGNOSIS — Z8616 Personal history of COVID-19: Secondary | ICD-10-CM

## 2019-01-04 HISTORY — DX: Personal history of COVID-19: Z86.16

## 2019-01-04 LAB — CBC
HCT: 36.8 % — ABNORMAL LOW (ref 39.0–52.0)
Hemoglobin: 12.3 g/dL — ABNORMAL LOW (ref 13.0–17.0)
MCH: 30.1 pg (ref 26.0–34.0)
MCHC: 33.4 g/dL (ref 30.0–36.0)
MCV: 90.2 fL (ref 80.0–100.0)
Platelets: 208 10*3/uL (ref 150–400)
RBC: 4.08 MIL/uL — ABNORMAL LOW (ref 4.22–5.81)
RDW: 13.8 % (ref 11.5–15.5)
WBC: 6.5 10*3/uL (ref 4.0–10.5)
nRBC: 0 % (ref 0.0–0.2)

## 2019-01-04 LAB — URINALYSIS, COMPLETE (UACMP) WITH MICROSCOPIC
Bacteria, UA: NONE SEEN
Bilirubin Urine: NEGATIVE
Glucose, UA: NEGATIVE mg/dL
Hgb urine dipstick: NEGATIVE
Ketones, ur: 5 mg/dL — AB
Leukocytes,Ua: NEGATIVE
Nitrite: NEGATIVE
Protein, ur: 100 mg/dL — AB
Specific Gravity, Urine: 1.018 (ref 1.005–1.030)
Squamous Epithelial / LPF: NONE SEEN (ref 0–5)
pH: 6 (ref 5.0–8.0)

## 2019-01-04 LAB — COMPREHENSIVE METABOLIC PANEL
ALT: 17 U/L (ref 0–44)
AST: 33 U/L (ref 15–41)
Albumin: 3.7 g/dL (ref 3.5–5.0)
Alkaline Phosphatase: 78 U/L (ref 38–126)
Anion gap: 10 (ref 5–15)
BUN: 27 mg/dL — ABNORMAL HIGH (ref 8–23)
CO2: 27 mmol/L (ref 22–32)
Calcium: 8.5 mg/dL — ABNORMAL LOW (ref 8.9–10.3)
Chloride: 98 mmol/L (ref 98–111)
Creatinine, Ser: 0.84 mg/dL (ref 0.61–1.24)
GFR calc Af Amer: >60 mL/min (ref >60)
GFR calc non Af Amer: >60 mL/min (ref >60)
Glucose, Bld: 107 mg/dL — ABNORMAL HIGH (ref 70–99)
Potassium: 3.5 mmol/L (ref 3.5–5.1)
Sodium: 135 mmol/L (ref 135–145)
Total Bilirubin: 0.7 mg/dL (ref 0.3–1.2)
Total Protein: 7.5 g/dL (ref 6.5–8.1)

## 2019-01-04 LAB — BRAIN NATRIURETIC PEPTIDE: B Natriuretic Peptide: 1871 pg/mL — ABNORMAL HIGH (ref 0.0–100.0)

## 2019-01-04 LAB — TYPE AND SCREEN
ABO/RH(D): O POS
Antibody Screen: NEGATIVE

## 2019-01-04 LAB — TROPONIN I (HIGH SENSITIVITY)
Troponin I (High Sensitivity): 50 ng/L — ABNORMAL HIGH (ref <18)
Troponin I (High Sensitivity): 46 ng/L — ABNORMAL HIGH (ref <18)
Troponin I (High Sensitivity): 48 ng/L — ABNORMAL HIGH (ref <18)
Troponin I (High Sensitivity): 36 ng/L — ABNORMAL HIGH (ref <18)
Troponin I (High Sensitivity): 36 ng/L — ABNORMAL HIGH (ref <18)

## 2019-01-04 LAB — PROTIME-INR
INR: 1.7 — ABNORMAL HIGH (ref 0.8–1.2)
Prothrombin Time: 19.5 seconds — ABNORMAL HIGH (ref 11.4–15.2)

## 2019-01-04 LAB — FIBRIN DERIVATIVES D-DIMER (ARMC ONLY): Fibrin derivatives D-dimer (ARMC): 2389.32 ng/mL (FEU) — ABNORMAL HIGH (ref 0.00–499.00)

## 2019-01-04 LAB — PROCALCITONIN: Procalcitonin: 0.11 ng/mL

## 2019-01-04 LAB — POC SARS CORONAVIRUS 2 AG: SARS Coronavirus 2 Ag: POSITIVE — AB

## 2019-01-04 LAB — FIBRINOGEN: Fibrinogen: 454 mg/dL (ref 210–475)

## 2019-01-04 LAB — LIPASE, BLOOD: Lipase: 29 U/L (ref 11–51)

## 2019-01-04 LAB — LACTATE DEHYDROGENASE: LDH: 179 U/L (ref 98–192)

## 2019-01-04 LAB — FERRITIN: Ferritin: 159 ng/mL (ref 24–336)

## 2019-01-04 MED ORDER — SODIUM CHLORIDE 0.9% FLUSH
3.0000 mL | Freq: Once | INTRAVENOUS | Status: AC
  Administered 2019-01-06: 3 mL via INTRAVENOUS

## 2019-01-04 MED ORDER — SENNOSIDES-DOCUSATE SODIUM 8.6-50 MG PO TABS
1.0000 | ORAL_TABLET | Freq: Two times a day (BID) | ORAL | Status: DC
  Administered 2019-01-04 – 2019-01-08 (×5): 1 via ORAL
  Filled 2019-01-04 (×8): qty 1

## 2019-01-04 MED ORDER — VITAMIN C 500 MG PO TABS
500.0000 mg | ORAL_TABLET | Freq: Every day | ORAL | Status: DC
  Administered 2019-01-04 – 2019-01-08 (×5): 500 mg via ORAL
  Filled 2019-01-04 (×6): qty 1

## 2019-01-04 MED ORDER — GUAIFENESIN ER 600 MG PO TB12
600.0000 mg | ORAL_TABLET | Freq: Two times a day (BID) | ORAL | Status: DC
  Administered 2019-01-04 – 2019-01-08 (×9): 600 mg via ORAL
  Filled 2019-01-04 (×9): qty 1

## 2019-01-04 MED ORDER — DEXAMETHASONE SODIUM PHOSPHATE 10 MG/ML IJ SOLN
10.0000 mg | Freq: Once | INTRAMUSCULAR | Status: AC
  Administered 2019-01-04: 10 mg via INTRAVENOUS
  Filled 2019-01-04: qty 1

## 2019-01-04 MED ORDER — ALBUTEROL SULFATE HFA 108 (90 BASE) MCG/ACT IN AERS
2.0000 | INHALATION_SPRAY | RESPIRATORY_TRACT | Status: DC | PRN
  Administered 2019-01-04: 2 via RESPIRATORY_TRACT
  Filled 2019-01-04: qty 6.7

## 2019-01-04 MED ORDER — SODIUM CHLORIDE 0.9 % IV SOLN
200.0000 mg | Freq: Once | INTRAVENOUS | Status: AC
  Administered 2019-01-04: 17:00:00 200 mg via INTRAVENOUS
  Filled 2019-01-04: qty 200

## 2019-01-04 MED ORDER — METOPROLOL TARTRATE 50 MG PO TABS
50.0000 mg | ORAL_TABLET | Freq: Two times a day (BID) | ORAL | Status: DC
  Administered 2019-01-04 – 2019-01-08 (×8): 50 mg via ORAL
  Filled 2019-01-04 (×8): qty 1

## 2019-01-04 MED ORDER — WARFARIN - PHARMACIST DOSING INPATIENT
Freq: Every day | Status: DC
  Filled 2019-01-04: qty 1

## 2019-01-04 MED ORDER — ATORVASTATIN CALCIUM 20 MG PO TABS
20.0000 mg | ORAL_TABLET | Freq: Every day | ORAL | Status: DC
  Administered 2019-01-04 – 2019-01-08 (×5): 20 mg via ORAL
  Filled 2019-01-04 (×5): qty 1

## 2019-01-04 MED ORDER — DILTIAZEM HCL ER COATED BEADS 120 MG PO CP24
240.0000 mg | ORAL_CAPSULE | Freq: Every day | ORAL | Status: DC
  Administered 2019-01-04 – 2019-01-08 (×5): 240 mg via ORAL
  Filled 2019-01-04: qty 2
  Filled 2019-01-04: qty 1
  Filled 2019-01-04 (×2): qty 2
  Filled 2019-01-04: qty 1
  Filled 2019-01-04: qty 2

## 2019-01-04 MED ORDER — TRAZODONE HCL 50 MG PO TABS
50.0000 mg | ORAL_TABLET | Freq: Every day | ORAL | Status: DC
  Administered 2019-01-04 – 2019-01-07 (×4): 50 mg via ORAL
  Filled 2019-01-04 (×4): qty 1

## 2019-01-04 MED ORDER — DM-GUAIFENESIN ER 30-600 MG PO TB12: 1.0000 | ORAL_TABLET | Freq: Two times a day (BID) | ORAL | Status: DC

## 2019-01-04 MED ORDER — DEXTROMETHORPHAN POLISTIREX ER 30 MG/5ML PO SUER
30.0000 mg | Freq: Two times a day (BID) | ORAL | Status: DC
  Administered 2019-01-04 – 2019-01-08 (×9): 30 mg via ORAL
  Filled 2019-01-04 (×12): qty 5

## 2019-01-04 MED ORDER — SODIUM CHLORIDE 0.9 % IV SOLN
Freq: Once | INTRAVENOUS | Status: AC
  Administered 2019-01-04: 17:00:00 via INTRAVENOUS

## 2019-01-04 MED ORDER — WARFARIN SODIUM 7.5 MG PO TABS
7.5000 mg | ORAL_TABLET | Freq: Once | ORAL | Status: AC
  Administered 2019-01-04: 7.5 mg via ORAL
  Filled 2019-01-04: qty 1

## 2019-01-04 MED ORDER — SODIUM CHLORIDE 0.9 % IV SOLN
100.0000 mg | Freq: Every day | INTRAVENOUS | Status: AC
  Administered 2019-01-05 – 2019-01-08 (×4): 100 mg via INTRAVENOUS
  Filled 2019-01-04 (×4): qty 100

## 2019-01-04 MED ORDER — IPRATROPIUM BROMIDE HFA 17 MCG/ACT IN AERS
2.0000 | INHALATION_SPRAY | RESPIRATORY_TRACT | Status: DC
  Administered 2019-01-04 – 2019-01-08 (×17): 2 via RESPIRATORY_TRACT
  Filled 2019-01-04 (×2): qty 12.9

## 2019-01-04 NOTE — ED Notes (Signed)
Pt given dinner tray and drink 

## 2019-01-04 NOTE — ED Notes (Signed)
Antibiotic not yet finished running as pt kept bending arm.

## 2019-01-04 NOTE — ED Notes (Signed)
2nd blood culture collected at L upper arm.

## 2019-01-04 NOTE — ED Notes (Signed)
EDP Paduchowski at bedside. Pt history of COPD per pt. Resp: regular and unlabored. Cough.

## 2019-01-04 NOTE — ED Notes (Signed)
Pt given an apple juice and ice cream.

## 2019-01-04 NOTE — ED Notes (Signed)
Pt reports "coughing up chunks of coal." Denies night sweats. States has been "cold." Denies home oxygen; pt currently on 2L at 94%. Pt crrently tachy on heart monitor. RR 19.

## 2019-01-04 NOTE — ED Notes (Addendum)
Attending notified blood cultures collected but pt had already received an antibiotic IV. Verbal from [place] to go ahead and finish collecting 2nd set of cultures.

## 2019-01-04 NOTE — ED Notes (Signed)
Pt resting calmly in bed. Denies any current needs. Bed locked low. Rails up. Call bell within reach.

## 2019-01-04 NOTE — ED Notes (Signed)
Pt requested cup of water. Messaged EDP. Will give to pt if given verbal okay. Pt denies any other needs currently. Urinal remains attached to bed rail; pt agrees to try again for urine sample when possible. Will notify this RN using call bell. Bed locked low.

## 2019-01-04 NOTE — Progress Notes (Signed)
Remdesivir - Pharmacy Brief Note   O:  ALT: 17 CXR: patchy airspace disease in the left chest worrisome for pneumonia SpO2: 95% on 2L   A/P:  Remdesivir 200 mg IVPB once followed by 100 mg IVPB daily x 4 days.   Dorena Bodo, PharmD 01/04/2019 3:07 PM

## 2019-01-04 NOTE — ED Notes (Signed)
Called lab to add on troponin. Levada Dy in lab confirms will run now.

## 2019-01-04 NOTE — Progress Notes (Signed)
[place] for warfarin Indication: atrial fibrillation  No Known Allergies   Vital Signs: Temp: 98.6 F (37 C) (12/07 1105) Temp Source: Oral (12/07 1105) BP: 149/76 (12/07 1630) Pulse Rate: 110 (12/07 1645)  Labs: Recent Labs    01/04/19 1117 01/04/19 1206 01/04/19 1317 01/04/19 1521  HGB 12.3*  --   --   --   HCT 36.8*  --   --   --   PLT 208  --   --   --   LABPROT  --   --   --  19.5*  INR  --   --   --  1.7*  CREATININE 0.84  --   --   --   TROPONINIHS  --  50* 46* 48*    Estimated Creatinine Clearance: 50.7 mL/min (by C-G formula based on SCr of 0.84 mg/dL).   Medical History: Past Medical History:  Diagnosis Date  . Coronary artery disease   . Hypertension     Assessment: 85 year old male with h/o afib on warfarin PTA. Patient also COVID+ being treated with remdesivir. Appears patient is taking warfarin 5 mg daily PTA. Based on records in [facility], patient therapeutic on this regimen for the past several months. Pharmacy consulted to continue warfarin.  Date INR Dose 12/7 1.7  Goal of Therapy:  INR 2-3 Monitor platelets by anticoagulation protocol: Yes   Plan:  INR subtherapeutic. Patient has not taken his medications today. Will give one time dose of warfarin 7.5 mg to boost INR and likely continue home dose tomorrow.  Tawnya Crook, PharmD 01/04/2019,5:39 PM

## 2019-01-04 NOTE — ED Notes (Signed)
Verbal okay from [place] - pt given water.

## 2019-01-04 NOTE — ED Triage Notes (Signed)
Pt presents to ED via POV with c/o increasing SOB and cough. Pt states has noticed red streaks in his mucous and today noticed red chunks in his mucous.   Upon arrival pt is able to speak in complete sentences, noted to be tachypneic, and RA sats 88% on RA. Pt denies use of home O2.  Pt also c/o constipation at this time, pt also c/o abdominal pain that is new. Pt rates pain 4/10. Pt denies N/V/D at this time, however reports that he has been losing weight.

## 2019-01-04 NOTE — ED Notes (Addendum)
Pt denies having taken BP meds today. HOB adjusted for pt.

## 2019-01-04 NOTE — H&P (Signed)
History and Physical    [name] [id number] DOB: [birth year] DOA: 01/04/2019  Referring MD/NP/PA:   PCP: Baxter Hire, MD   Patient coming from:  The patient is coming from home.  At baseline, pt is independent for most of ADL.        Chief Complaint: Shortness of breath and constipation  HPI: [name] is a 85 y.o. male with medical history significant of hypertension, atrial fibrillation on Coumadin, CAD, who presents with shortness of breath and constipation.  Patient states that he has been having shortness breath in the past several days, which is progressively worsening.  He has dry cough, no chest pain, denies fever or chills.  No nausea, vomiting, diarrhea, abdominal pain, symptoms of UTI or unilateral weakness.  Patient states that he has been constipated which is causing some abdominal pain in the past 4 days.  ED Course: pt was found to have COVID-19 Ag positive, WBC 6.5, troponin 50, 64, electrolytes renal function okay, temperature normal, blood pressure 176/86, tachycardia, tachypnea, oxygen saturation 88% on room air which improved to 95% on 2 L nasal cannula oxygen. Chest x-ray with patchy left lung infiltration.  Patient is admitted to [facility] as inpatient.  Review of Systems:   General: no fevers, chills, no body weight gain, has fatigue HEENT: no blurry vision, hearing changes or sore throat Respiratory: has dyspnea, coughing, no wheezing CV: no chest pain, no palpitations GI: no nausea, vomiting,  diarrhea, has constipation and abdominal pain GU: no dysuria, burning on urination, increased urinary frequency, hematuria  Ext: no leg edema Neuro: no unilateral weakness, numbness, or tingling, no vision change or hearing loss Skin: no rash, no skin tear. MSK: No muscle spasm, no deformity, no limitation of range of movement in spin Heme: No easy bruising.  Travel history: No recent long distant travel.  Allergy: No Known Allergies  Past  Medical History:  Diagnosis Date  . Coronary artery disease   . Hypertension     Past Surgical History:  Procedure Laterality Date  . Renal Bypass Surgery      Social History:  reports that he has quit smoking. He has never used smokeless tobacco. He reports that he does not drink alcohol or use drugs.  Family History: No family history on file.  Reviewed with patient, he is not very sure about family history.  Prior to Admission medications   Medication Sig Start Date End Date Taking? Authorizing Provider  aspirin EC 81 MG tablet Take 81 mg by mouth daily.    [provider]  atorvastatin (LIPITOR) 20 MG tablet Take 20 mg by mouth daily.    [provider]  diltiazem (CARDIZEM CD) 240 MG 24 hr capsule Take 240 mg by mouth daily.    [provider]  furosemide (LASIX) 40 MG tablet Take 40 mg by mouth.    [provider]  metoprolol tartrate (LOPRESSOR) 50 MG tablet Take 50 mg by mouth 2 (two) times daily.    [provider]  warfarin (COUMADIN) 2 MG tablet Take 2 mg by mouth daily.    [provider]    Physical Exam: Vitals:   01/04/19 2104 01/04/19 2115 01/04/19 2130 01/04/19 2133  BP:   137/73 137/73  Pulse: (!) 111 (!) 115 (!) 59 (!) 110  Resp:   (!) 27   Temp:      TempSrc:      SpO2: 97% 96% 96%   Weight:  Height:       General: Not in acute distress HEENT:       Eyes: PERRL, EOMI, no scleral icterus.       ENT: No discharge from the ears and nose, no pharynx injection, no tonsillar enlargement.        Neck: No JVD, no bruit, no mass felt. Heme: No neck lymph node enlargement. Cardiac: S1/S2, RRR, No murmurs, No gallops or rubs. Respiratory: No rales, wheezing, rhonchi or rubs. GI: Soft, nondistended, mild tenderness in central abdomen, no rebound pain, no organomegaly, BS present. GU: No hematuria Ext: No pitting leg edema bilaterally. 2+DP/PT pulse bilaterally. Musculoskeletal: No joint deformities, No  joint redness or warmth, no limitation of ROM in spin. Skin: No rashes.  Neuro: Alert, oriented X3, cranial nerves II-XII grossly intact, moves all extremities normally.  Psych: Patient is not psychotic, no suicidal or hemocidal ideation.  Labs on Admission: I have personally reviewed following labs and imaging studies  CBC: Recent Labs  Lab 01/04/19 1117  WBC 6.5  HGB 12.3*  HCT 36.8*  MCV 90.2  PLT 123XX123   Basic Metabolic Panel: Recent Labs  Lab 01/04/19 1117  NA 135  K 3.5  CL 98  CO2 27  GLUCOSE 107*  BUN 27*  CREATININE 0.84  CALCIUM 8.5*   GFR: Estimated Creatinine Clearance: 50.7 mL/min (by C-G formula based on SCr of 0.84 mg/dL). Liver Function Tests: Recent Labs  Lab 01/04/19 1117  AST 33  ALT 17  ALKPHOS 78  BILITOT 0.7  PROT 7.5  ALBUMIN 3.7   Recent Labs  Lab 01/04/19 1117  LIPASE 29   No results for input(s): AMMONIA in the last 168 hours. Coagulation Profile: Recent Labs  Lab 01/04/19 1521  INR 1.7*   Cardiac Enzymes: No results for input(s): CKTOTAL, CKMB, CKMBINDEX, TROPONINI in the last 168 hours. BNP (last 3 results) No results for input(s): PROBNP in the last 8760 hours. HbA1C: No results for input(s): HGBA1C in the last 72 hours. CBG: No results for input(s): GLUCAP in the last 168 hours. Lipid Profile: No results for input(s): CHOL, HDL, LDLCALC, TRIG, CHOLHDL, LDLDIRECT in the last 72 hours. Thyroid Function Tests: No results for input(s): TSH, T4TOTAL, FREET4, T3FREE, THYROIDAB in the last 72 hours. Anemia Panel: Recent Labs    01/04/19 1927  FERRITIN 159   Urine analysis:    Component Value Date/Time   COLORURINE YELLOW (A) 01/04/2019 1206   APPEARANCEUR CLEAR (A) 01/04/2019 1206   LABSPEC 1.018 01/04/2019 1206   PHURINE 6.0 01/04/2019 1206   GLUCOSEU NEGATIVE 01/04/2019 1206   HGBUR NEGATIVE 01/04/2019 1206   BILIRUBINUR NEGATIVE 01/04/2019 1206   KETONESUR 5 (A) 01/04/2019 1206   PROTEINUR 100 (A) 01/04/2019  1206   NITRITE NEGATIVE 01/04/2019 1206   LEUKOCYTESUR NEGATIVE 01/04/2019 1206   Sepsis Labs: @LABRCNTIP (procalcitonin:4,lacticidven:4) )No results found for this or any previous visit (from the past 240 hour(s)).   Radiological Exams on Admission: Dg Chest 2 View  Result Date: 01/04/2019 CLINICAL DATA:  Increasing shortness of breath and cough. EXAM: CHEST - 2 VIEW COMPARISON:  CT chest 11/29/2015.  PA and lateral chest 02/01/2014. FINDINGS: The lungs are markedly emphysematous. There is patchy airspace disease in the left upper and lower lobes. Biapical scar is present. Small bilateral pleural effusions are seen. Heart size is normal. Aortic atherosclerosis. Remote L1 and L2 compression fractures are unchanged since the prior CT. The patient has a T7 compression fracture which is new since the prior CT.  IMPRESSION: Patchy airspace disease in the left chest worrisome for pneumonia. Emphysema. Small bilateral pleural effusions. Atherosclerosis. Remote L1 and L2 compression fractures. T7 compression fracture is new since 2017 and appears remote. Electronically Signed   By: Inge Rise M.D.   On: 01/04/2019 11:47     EKG: Independently reviewed.  Sinus rhythm, QTC 517, RAD, right bundle blockage, early R wave progression   Assessment/Plan Principal Problem:   Acute respiratory disease due to COVID-19 virus Active Problems:   Atrial fibrillation with RVR (HCC)   Hypertension   Coronary artery disease   Elevated troponin   Constipation   HLD (hyperlipidemia)   Acute respiratory disease due to COVID-19 virus: O2 sat 88% on RA -->95% on 2L oxygen  -will admit to tele as inpt -Remdesivir per pharm -Solumedrol 20 mg bid -vitamin C, zinc.  -bronchodilators -PRN Mucinex for cough -f/u Blood culture -Gentle IV fluid -D-dimer, BNP,Trop, LFT (check CMP), CRP, LDH, Procalcitonin, ferritin, fibinogen, TG, Hep B SAg -Daily CRP, Ferritin, D-dimer, -Will ask the patient to maintain an  awake prone position for 16+ hours a day, if possible, with a minimum of 2-3 hours at a time -IF patient deteriorates, will consult PCCM and ID  Atrial fibrillation with RVR ([place]): Hr 100-110s -continue coumadin -metoprolol, cardizem  HTN:  -Continue home medications: metoprolol and cardizem -IV hydralazine prn  Hx of coronary artery disease and elevated troponin: trop minimally elevated. No CP. Likely demand ischemia.  -ASA and metoprolol - lipitpr -check A1c and FLP -repeat EKG in AM  HLD: -lipitgor  Constipation: -miralax and senokot    Inpatient status:  # Patient requires inpatient status due to high intensity of service, high risk for further deterioration and high frequency of surveillance required.  I certify that at the point of admission it is my clinical judgment that the patient will require inpatient hospital care spanning beyond 2 midnights from the point of admission.  . This patient has multiple chronic comorbidities including hypertension, atrial fibrillation on Coumadin, CAD, who presents with shortness of breath and constipation. . Now patient has presenting with Acute respiratory disease due to COVID-19 virus and elevated trop . The initial radiographic and laboratory data are worrisome because of positive Covid19 Ag, patchy infiltration by CXR . Current medical needs: please see my assessment and plan . Predictability of an adverse outcome (risk): pt presents with acute respiratory disease due to COVID-19 virus and elevated trop. Given her his old age, he is at high risk of deteriorating. Will need to be treated in hospital for at least two days. .     DVT ppx: on Coumadin Code Status: Full code Family Communication: None at bed side.       Disposition Plan:  Anticipate discharge back to previous home environment Consults called:  none Admission status: med-surg as inpt  Date of Service 01/04/2019    Ivor Costa [facility]   If 7PM-7AM,  please contact night-coverage [email or web address] Password [facility] 01/04/2019, 10:51 PM

## 2019-01-04 NOTE — ED Provider Notes (Signed)
[facility] Emergency Department Provider Note  Time seen: 11:56 AM  I have reviewed the triage vital signs and the nursing notes.   HISTORY  Chief Complaint Shortness of Breath   HPI [name] is a 85 y.o. male with a past medical history of CAD, hypertension, COPD, presents to the emergency department for shortness of breath.  According to the patient for the past week or 2 is been feeling progressively more short of breath over the past several days has had a cough with occasional blood streaking in his sputum.  Patient denies any fever.  Denies any known sick contacts.  His wife recently passed away and he lives at home with his daughter.   Past Medical History:  Diagnosis Date  . Coronary artery disease   . Hypertension     There are no active problems to display for this patient.   Past Surgical History:  Procedure Laterality Date  . Renal Bypass Surgery      Prior to Admission medications   Medication Sig Start Date End Date Taking? Authorizing Provider  aspirin EC 81 MG tablet Take 81 mg by mouth daily.    [provider]  atorvastatin (LIPITOR) 20 MG tablet Take 20 mg by mouth daily.    [provider]  diltiazem (CARDIZEM CD) 240 MG 24 hr capsule Take 240 mg by mouth daily.    [provider]  furosemide (LASIX) 40 MG tablet Take 40 mg by mouth.    [provider]  metoprolol tartrate (LOPRESSOR) 50 MG tablet Take 50 mg by mouth 2 (two) times daily.    [provider]  warfarin (COUMADIN) 2 MG tablet Take 2 mg by mouth daily.    [provider]    No Known Allergies  No family history on file.  Social History Social History   Tobacco Use  . Smoking status: Former Research scientist (life sciences)  . Smokeless tobacco: Never Used  Substance Use Topics  . Alcohol use: Never    Frequency: Never  . Drug use: Never    Review of Systems Constitutional: Negative for fever. Cardiovascular: Negative  for chest pain. Respiratory: Positive for shortness of breath.  Positive for cough.  Occasional bloody streaked sputum. Gastrointestinal: Negative for abdominal pain Musculoskeletal: Negative for musculoskeletal complaints Neurological: Negative for headache All other ROS negative  ____________________________________________   PHYSICAL EXAM:  VITAL SIGNS: ED Triage Vitals  Enc Vitals Group     BP 01/04/19 1108 (!) 130/103     Pulse Rate 01/04/19 1105 (!) 108     Resp 01/04/19 1105 (!) 26     Temp 01/04/19 1105 98.6 F (37 C)     Temp Source 01/04/19 1105 Oral     SpO2 01/04/19 1105 (!) 88 %     Weight 01/04/19 1111 130 lb (59 kg)     Height 01/04/19 1111 5\' 9"  (1.753 m)     Head Circumference --      Peak Flow --      Pain Score 01/04/19 1110 4     Pain Loc --      Pain Edu? --      Excl. in [place]? --    Constitutional: Alert and oriented. Well appearing and in no distress. Eyes: Normal exam ENT      Head: Normocephalic and atraumatic.      Mouth/Throat: Mucous membranes are moist. Cardiovascular: Normal rate, regular rhythm.  Respiratory: Normal respiratory effort without tachypnea nor retractions. Breath sounds  are clear, without obvious wheeze rales or rhonchi. Gastrointestinal: Soft and nontender. No distention.   Musculoskeletal: Nontender with normal range of motion in all extremities.  Neurologic:  Normal speech and language. No gross focal neurologic deficits  Skin:  Skin is warm, dry and intact.  Psychiatric: Mood and affect are normal.   ____________________________________________    EKG  EKG viewed and interpreted by myself shows atrial fibrillation with rapid ventricular response at 116 bpm with a mildly widened QRS, largely normal axis with nonspecific ST changes  ____________________________________________    RADIOLOGY  Chest x-ray shows patchy airspace disease in the left chest concerning for  pneumonia  ____________________________________________   INITIAL IMPRESSION / ASSESSMENT AND PLAN / ED COURSE  Pertinent labs & imaging results that were available during my care of the patient were reviewed by me and considered in my medical decision making (see chart for details).   Patient presents to the emergency department for shortness of breath cough with blood-streaked sputum.  Patient currently satting well on 2 L of oxygen around 94 to 95%.  No home O2 requirement at baseline.  Patient appears to be in A. fib.  In reviewing the patient's records he was noted to be in A. fib in 2016 and was placed on Eliquis at that time.  Patient is currently taking warfarin for anticoagulation.  Diltiazem for rate control.  We will dose IV fluids and continue to closely monitor.  Heart rate currently around 100 bpm.  Given the patient's apparent pneumonia on his chest x-ray we will cover with antibiotics and send blood cultures.  We will also send a rapid as well as a PCR Covid test to rule out coronavirus infection.  [name] Covid test has resulted positive.  We will dose IV Decadron.  Patient will require admission either locally or to [place] was evaluated in Emergency Department on 01/04/2019 for the symptoms described in the history of present illness. He was evaluated in the context of the global COVID-19 pandemic, which necessitated consideration that the patient might be at risk for infection with the SARS-CoV-2 virus that causes COVID-19. Institutional protocols and algorithms that pertain to the evaluation of patients at risk for COVID-19 are in a state of rapid change based on information released by regulatory bodies including the CDC and federal and state organizations. These policies and algorithms were followed during the patient's care in the ED.  ____________________________________________   FINAL CLINICAL IMPRESSION(S) / ED  DIAGNOSES  COVID-19 Hypoxia   Harvest Dark, MD 01/04/19 1301

## 2019-01-05 ENCOUNTER — Other Ambulatory Visit: Payer: Self-pay

## 2019-01-05 DIAGNOSIS — K5904 Chronic idiopathic constipation: Secondary | ICD-10-CM

## 2019-01-05 DIAGNOSIS — I2510 Atherosclerotic heart disease of native coronary artery without angina pectoris: Secondary | ICD-10-CM

## 2019-01-05 LAB — COMPREHENSIVE METABOLIC PANEL
ALT: 14 U/L (ref 0–44)
AST: 28 U/L (ref 15–41)
Albumin: 3 g/dL — ABNORMAL LOW (ref 3.5–5.0)
Alkaline Phosphatase: 72 U/L (ref 38–126)
Anion gap: 12 (ref 5–15)
BUN: 29 mg/dL — ABNORMAL HIGH (ref 8–23)
CO2: 27 mmol/L (ref 22–32)
Calcium: 8.4 mg/dL — ABNORMAL LOW (ref 8.9–10.3)
Chloride: 99 mmol/L (ref 98–111)
Creatinine, Ser: 0.77 mg/dL (ref 0.61–1.24)
GFR calc Af Amer: >60 mL/min (ref >60)
GFR calc non Af Amer: >60 mL/min (ref >60)
Glucose, Bld: 111 mg/dL — ABNORMAL HIGH (ref 70–99)
Potassium: 3.1 mmol/L — ABNORMAL LOW (ref 3.5–5.1)
Sodium: 138 mmol/L (ref 135–145)
Total Bilirubin: 0.7 mg/dL (ref 0.3–1.2)
Total Protein: 6.7 g/dL (ref 6.5–8.1)

## 2019-01-05 LAB — CBC WITH DIFFERENTIAL/PLATELET
Abs Immature Granulocytes: 0.03 10*3/uL (ref 0.00–0.07)
Basophils Absolute: 0 10*3/uL (ref 0.0–0.1)
Basophils Relative: 0 %
Eosinophils Absolute: 0 10*3/uL (ref 0.0–0.5)
Eosinophils Relative: 0 %
HCT: 38.8 % — ABNORMAL LOW (ref 39.0–52.0)
Hemoglobin: 13.2 g/dL (ref 13.0–17.0)
Immature Granulocytes: 1 %
Lymphocytes Relative: 19 %
Lymphs Abs: 1 10*3/uL (ref 0.7–4.0)
MCH: 29.7 pg (ref 26.0–34.0)
MCHC: 34 g/dL (ref 30.0–36.0)
MCV: 87.2 fL (ref 80.0–100.0)
Monocytes Absolute: 0.8 10*3/uL (ref 0.1–1.0)
Monocytes Relative: 14 %
Neutro Abs: 3.8 10*3/uL (ref 1.7–7.7)
Neutrophils Relative %: 66 %
Platelets: 206 10*3/uL (ref 150–400)
RBC: 4.45 MIL/uL (ref 4.22–5.81)
RDW: 13.8 % (ref 11.5–15.5)
WBC: 5.6 10*3/uL (ref 4.0–10.5)
nRBC: 0 % (ref 0.0–0.2)

## 2019-01-05 LAB — LIPID PANEL
Cholesterol: 134 mg/dL (ref 0–200)
HDL: 36 mg/dL — ABNORMAL LOW (ref >40)
LDL Cholesterol: 86 mg/dL (ref 0–99)
Total CHOL/HDL Ratio: 3.7 RATIO
Triglycerides: 58 mg/dL (ref <150)
VLDL: 12 mg/dL (ref 0–40)

## 2019-01-05 LAB — HEPATITIS B SURFACE ANTIGEN: Hepatitis B Surface Ag: NONREACTIVE

## 2019-01-05 LAB — FIBRIN DERIVATIVES D-DIMER (ARMC ONLY): Fibrin derivatives D-dimer (ARMC): 2727.94 ng/mL (FEU) — ABNORMAL HIGH (ref 0.00–499.00)

## 2019-01-05 LAB — PROTIME-INR
INR: 2.1 — ABNORMAL HIGH (ref 0.8–1.2)
Prothrombin Time: 23.4 seconds — ABNORMAL HIGH (ref 11.4–15.2)

## 2019-01-05 LAB — ABO/RH: ABO/RH(D): O POS

## 2019-01-05 LAB — SARS CORONAVIRUS 2 (TAT 6-24 HRS): SARS Coronavirus 2: POSITIVE — AB

## 2019-01-05 LAB — MAGNESIUM: Magnesium: 2.1 mg/dL (ref 1.7–2.4)

## 2019-01-05 LAB — FERRITIN: Ferritin: 178 ng/mL (ref 24–336)

## 2019-01-05 LAB — C-REACTIVE PROTEIN
CRP: 10.2 mg/dL — ABNORMAL HIGH (ref <1.0)
CRP: 9.8 mg/dL — ABNORMAL HIGH (ref <1.0)

## 2019-01-05 LAB — HEMOGLOBIN A1C
Hgb A1c MFr Bld: 6.5 % — ABNORMAL HIGH (ref 4.8–5.6)
Mean Plasma Glucose: 139.85 mg/dL

## 2019-01-05 MED ORDER — DEXAMETHASONE SODIUM PHOSPHATE 10 MG/ML IJ SOLN
6.0000 mg | INTRAMUSCULAR | Status: DC
  Administered 2019-01-05 – 2019-01-08 (×4): 6 mg via INTRAVENOUS
  Filled 2019-01-05 (×4): qty 0.6

## 2019-01-05 MED ORDER — ADULT MULTIVITAMIN W/MINERALS CH
1.0000 | ORAL_TABLET | Freq: Every day | ORAL | Status: DC
  Administered 2019-01-06 – 2019-01-08 (×3): 1 via ORAL
  Filled 2019-01-05 (×3): qty 1

## 2019-01-05 MED ORDER — ACETAMINOPHEN 325 MG PO TABS: 650.0000 mg | ORAL_TABLET | Freq: Four times a day (QID) | ORAL | Status: DC | PRN

## 2019-01-05 MED ORDER — WARFARIN SODIUM 6 MG PO TABS
6.0000 mg | ORAL_TABLET | Freq: Once | ORAL | Status: AC
  Administered 2019-01-05: 22:00:00 6 mg via ORAL
  Filled 2019-01-05: qty 1

## 2019-01-05 MED ORDER — ENSURE ENLIVE PO LIQD
237.0000 mL | Freq: Three times a day (TID) | ORAL | Status: DC
  Administered 2019-01-05: 237 mL via ORAL

## 2019-01-05 NOTE — ED Notes (Signed)
ED TO INPATIENT HANDOFF REPORT  ED Nurse Name and Phone #: Joelene Millin [phone]  S Name/Age/Gender [name] 85 y.o. male Room/Bed: ED25A/ED25A  Code Status   Code Status: Full Code  Home/SNF/Other Home Patient oriented to: self, place, time and situation Is this baseline? Yes   Triage Complete: Triage complete  Chief Complaint Shortness of Breath  Triage Note Pt presents to ED via POV with c/o increasing SOB and cough. Pt states has noticed red streaks in his mucous and today noticed red chunks in his mucous.   Upon arrival pt is able to speak in complete sentences, noted to be tachypneic, and RA sats 88% on RA. Pt denies use of home O2.  Pt also c/o constipation at this time, pt also c/o abdominal pain that is new. Pt rates pain 4/10. Pt denies N/V/D at this time, however reports that he has been losing weight.    Allergies No Known Allergies  Level of Care/Admitting Diagnosis ED Disposition    ED Disposition Condition [facility] Area: [place] [[id number]]  Level of Care: Med-Surg [16]  Covid Evaluation: Confirmed COVID Positive  Diagnosis: Acute respiratory disease due to COVID-19 virus [id number]  Admitting Physician: Ivor Costa [[id number]]  Attending Physician: Ivor Costa [[id number]]  Estimated length of stay: past midnight tomorrow  Certification:: I certify this patient will need inpatient services for at least 2 midnights  PT Class (Do Not Modify): Inpatient [101]  PT Acc Code (Do Not Modify): Private [1]       B Medical/Surgery History Past Medical History:  Diagnosis Date  . Coronary artery disease   . Hypertension    Past Surgical History:  Procedure Laterality Date  . Renal Bypass Surgery       A IV Location/Drains/Wounds Patient Lines/Drains/Airways Status   Active Line/Drains/Airways    Name:   Placement date:   Placement time:   Site:   Days:   Peripheral IV 01/04/19 Right Forearm   01/04/19    1126     Forearm   1   Peripheral IV 01/04/19 Left Wrist   01/04/19    2036    Wrist   1          Intake/Output Last 24 hours  Intake/Output Summary (Last 24 hours) at 01/05/2019 0139 Last data filed at 01/04/2019 1913 Gross per 24 hour  Intake -  Output 275 ml  Net -275 ml    Labs/Imaging Results for orders placed or performed during the hospital encounter of 01/04/19 (from the past 48 hour(s))  Lipase, blood     Status: None   Collection Time: 01/04/19 11:17 AM  Result Value Ref Range   Lipase 29 11 - 51 U/L    Comment: Performed at [facility], [address] [place], [place] [address]  Comprehensive metabolic panel     Status: Abnormal   Collection Time: 01/04/19 11:17 AM  Result Value Ref Range   Sodium 135 135 - 145 mmol/L   Potassium 3.5 3.5 - 5.1 mmol/L   Chloride 98 98 - 111 mmol/L   CO2 27 22 - 32 mmol/L   Glucose, Bld 107 (H) 70 - 99 mg/dL   BUN 27 (H) 8 - 23 mg/dL   Creatinine, Ser 0.84 0.61 - 1.24 mg/dL   Calcium 8.5 (L) 8.9 - 10.3 mg/dL   Total Protein 7.5 6.5 - 8.1 g/dL   Albumin 3.7 3.5 - 5.0 g/dL   AST 33 15 -  41 U/L   ALT 17 0 - 44 U/L   Alkaline Phosphatase 78 38 - 126 U/L   Total Bilirubin 0.7 0.3 - 1.2 mg/dL   GFR calc non Af Amer >60 >60 mL/min   GFR calc Af Amer >60 >60 mL/min   Anion gap 10 5 - 15    Comment: Performed at [facility], [address] [place], [place] [address]  CBC     Status: Abnormal   Collection Time: 01/04/19 11:17 AM  Result Value Ref Range   WBC 6.5 4.0 - 10.5 K/uL   RBC 4.08 (L) 4.22 - 5.81 MIL/uL   Hemoglobin 12.3 (L) 13.0 - 17.0 g/dL   HCT 36.8 (L) 39.0 - 52.0 %   MCV 90.2 80.0 - 100.0 fL   MCH 30.1 26.0 - 34.0 pg   MCHC 33.4 30.0 - 36.0 g/dL   RDW 13.8 11.5 - 15.5 %   Platelets 208 150 - 400 K/uL   nRBC 0.0 0.0 - 0.2 %    Comment: Performed at [facility], [address] [place], [place] [address]  Urinalysis, Complete w Microscopic     Status: Abnormal   Collection Time: 01/04/19  12:06 PM  Result Value Ref Range   Color, Urine YELLOW (A) YELLOW   APPearance CLEAR (A) CLEAR   Specific Gravity, Urine 1.018 1.005 - 1.030   pH 6.0 5.0 - 8.0   Glucose, UA NEGATIVE NEGATIVE mg/dL   Hgb urine dipstick NEGATIVE NEGATIVE   Bilirubin Urine NEGATIVE NEGATIVE   Ketones, ur 5 (A) NEGATIVE mg/dL   Protein, ur 100 (A) NEGATIVE mg/dL   Nitrite NEGATIVE NEGATIVE   Leukocytes,Ua NEGATIVE NEGATIVE   RBC / HPF 0-5 0 - 5 RBC/hpf   WBC, UA 0-5 0 - 5 WBC/hpf   Bacteria, UA NONE SEEN NONE SEEN   Squamous Epithelial / LPF NONE SEEN 0 - 5   Mucus PRESENT     Comment: Performed at [facility], [address] [place], [place] [address]  Troponin I (High Sensitivity)     Status: Abnormal   Collection Time: 01/04/19 12:06 PM  Result Value Ref Range   Troponin I (High Sensitivity) 50 (H) <18 ng/L    Comment: (NOTE) Elevated high sensitivity troponin I (hsTnI) values and significant  changes across serial measurements may suggest ACS but many other  chronic and acute conditions are known to elevate hsTnI results.  Refer to the "Links" section for chest pain algorithms and additional  guidance. Performed at [facility], [place], [place] [address]   SARS CORONAVIRUS 2 (TAT 6-24 HRS) Nasopharyngeal Nasopharyngeal Swab     Status: Abnormal   Collection Time: 01/04/19 12:06 PM   Specimen: Nasopharyngeal Swab  Result Value Ref Range   SARS Coronavirus 2 POSITIVE (A) NEGATIVE    Comment: RESULT CALLED TO, READ BACK BY AND VERIFIED WITH: Duyen Beckom [email or web address]  ON 12.8.2020 BY TCALDWELL MT. (NOTE) SARS-CoV-2 target nucleic acids are DETECTED. The SARS-CoV-2 RNA is generally detectable in upper and lower respiratory specimens during the acute phase of infection. Positive results are indicative of the presence of SARS-CoV-2 RNA. Clinical correlation with patient history and other diagnostic information is  necessary to determine patient infection  status. Positive results do not rule out bacterial infection or co-infection with other viruses.  The expected result is Negative. Fact Sheet for Patients: SugarRoll.be Fact Sheet for Healthcare Providers: https://www.woods-mathews.com/ This test is not yet approved or cleared by the [place]  States FDA and  has been authorized for detection and/or diagnosis of SARS-CoV-2 by FDA under an Emergency Use Authorization (EUA). This EUA will remain  in effect (meaning this test  can be used) for the duration of the COVID-19 declaration under Section 564(b)(1) of the Act, 21 U.S.C. section 360bbb-3(b)(1), unless the authorization is terminated or revoked sooner. Performed at [facility], [place] [address] [place], [place] [address]   POC SARS Coronavirus 2 Ag     Status: Abnormal   Collection Time: 01/04/19 12:35 PM  Result Value Ref Range   SARS Coronavirus 2 Ag POSITIVE (A) NEGATIVE    Comment: (NOTE) SARS-CoV-2 antigen PRESENT. Positive results indicate the presence of viral antigens, but clinical correlation with patient history and other diagnostic information is necessary to determine patient infection status.  Positive results do not rule out bacterial infection or co-infection  with other viruses. False positive results are rare but can occur, and confirmatory RT-PCR testing may be appropriate in some circumstances. The expected result is Negative. Fact Sheet for Patients: PodPark.tn Fact Sheet for Providers: GiftContent.is  This test is not yet approved or cleared by the [place] FDA and  has been authorized for detection and/or diagnosis of SARS-CoV-2 by FDA under an Emergency Use Authorization (EUA).  This EUA will remain in effect (meaning this test can be used) for the duration of  the COVID-19 declaration under Section 564(b)(1) of the Act, 21 U.S.C. section  360bbb-3(b)(1), unless the a uthorization is terminated or revoked sooner.   Troponin I (High Sensitivity)     Status: Abnormal   Collection Time: 01/04/19  1:17 PM  Result Value Ref Range   Troponin I (High Sensitivity) 46 (H) <18 ng/L    Comment: (NOTE) Elevated high sensitivity troponin I (hsTnI) values and significant  changes across serial measurements may suggest ACS but many other  chronic and acute conditions are known to elevate hsTnI results.  Refer to the "Links" section for chest pain algorithms and additional  guidance. Performed at [facility], [address] [place], [place] [address]   Today - PT/INR     Status: Abnormal   Collection Time: 01/04/19  3:21 PM  Result Value Ref Range   Prothrombin Time 19.5 (H) 11.4 - 15.2 seconds   INR 1.7 (H) 0.8 - 1.2    Comment: (NOTE) INR goal varies based on device and disease states. Performed at [facility], [place], [place] [address]   Troponin I (High Sensitivity)     Status: Abnormal   Collection Time: 01/04/19  3:21 PM  Result Value Ref Range   Troponin I (High Sensitivity) 48 (H) <18 ng/L    Comment: (NOTE) Elevated high sensitivity troponin I (hsTnI) values and significant  changes across serial measurements may suggest ACS but many other  chronic and acute conditions are known to elevate hsTnI results.  Refer to the "Links" section for chest pain algorithms and additional  guidance. Performed at [facility], [place], [place] [address]   Troponin I (High Sensitivity)     Status: Abnormal   Collection Time: 01/04/19  7:23 PM  Result Value Ref Range   Troponin I (High Sensitivity) 36 (H) <18 ng/L    Comment: (NOTE) Elevated high sensitivity troponin I (hsTnI) values and significant  changes across serial measurements may suggest ACS but many other  chronic and acute conditions are known to elevate hsTnI results.  Refer to the "Links"  section for  chest pain algorithms and additional  guidance. Performed at [facility], [address] [place], [place] [address]   Ferritin     Status: None   Collection Time: 01/04/19  7:27 PM  Result Value Ref Range   Ferritin 159 24 - 336 ng/mL    Comment: Performed at [facility], [address] [place], [place] [address]  Lactate dehydrogenase     Status: None   Collection Time: 01/04/19  7:27 PM  Result Value Ref Range   LDH 179 98 - 192 U/L    Comment: Performed at [facility], [address] [place], [place] [address]  Procalcitonin     Status: None   Collection Time: 01/04/19  7:27 PM  Result Value Ref Range   Procalcitonin 0.11 ng/mL    Comment:        Interpretation: PCT (Procalcitonin) <= 0.5 ng/mL: Systemic infection (sepsis) is not likely. Local bacterial infection is possible. (NOTE)       Sepsis PCT Algorithm           Lower Respiratory Tract                                      Infection PCT Algorithm    ----------------------------     ----------------------------         PCT < 0.25 ng/mL                PCT < 0.10 ng/mL         Strongly encourage             Strongly discourage   discontinuation of antibiotics    initiation of antibiotics    ----------------------------     -----------------------------       PCT 0.25 - 0.50 ng/mL            PCT 0.10 - 0.25 ng/mL               OR       >80% decrease in PCT            Discourage initiation of                                            antibiotics      Encourage discontinuation           of antibiotics    ----------------------------     -----------------------------         PCT >= 0.50 ng/mL              PCT 0.26 - 0.50 ng/mL               AND        <80% decrease in PCT             Encourage initiation of                                             antibiotics       Encourage continuation           of antibiotics    ----------------------------     -----------------------------  PCT >= 0.50 ng/mL                  PCT > 0.50 ng/mL               AND         increase in PCT                  Strongly encourage                                      initiation of antibiotics    Strongly encourage escalation           of antibiotics                                     -----------------------------                                           PCT <= 0.25 ng/mL                                                 OR                                        > 80% decrease in PCT                                     Discontinue / Do not initiate                                             antibiotics Performed at [facility], [address] [place], [place] [address]   Type and screen [place]     Status: None   Collection Time: 01/04/19  8:23 PM  Result Value Ref Range   ABO/RH(D) O POS    Antibody Screen NEG    Sample Expiration      01/07/2019,2359 Performed at [facility], [address], [place], [place] [address]   Brain natriuretic peptide     Status: Abnormal   Collection Time: 01/04/19  8:23 PM  Result Value Ref Range   B Natriuretic Peptide 1,871.0 (H) 0.0 - 100.0 pg/mL    Comment: Performed at [facility], [address] [place], [place] [address]  Fibrin derivatives D-Dimer [facility] only)     Status: Abnormal   Collection Time: 01/04/19  8:23 PM  Result Value Ref Range   Fibrin derivatives D-dimer ([facility]) 2,389.32 (H) 0.00 - 499.00 ng/mL (FEU)    Comment: (NOTE) <> Exclusion of Venous Thromboembolism (VTE) - OUTPATIENT ONLY   (Emergency Department or [place])   0-499 ng/ml (FEU): With a low to intermediate pretest probability  for VTE this test result excludes the diagnosis                      of VTE.   >499 ng/ml (FEU) : VTE not excluded; additional work up for VTE is                      required. <> Testing on Inpatients and Evaluation of Disseminated Intravascular   Coagulation  (DIC) Reference Range:   0-499 ng/ml (FEU) Performed at [facility], [address] [place], [place] [address]   Fibrinogen     Status: None   Collection Time: 01/04/19  8:23 PM  Result Value Ref Range   Fibrinogen 454 210 - 475 mg/dL    Comment: Performed at [facility], [place], [place] [address]  Troponin I (High Sensitivity)     Status: Abnormal   Collection Time: 01/04/19  8:57 PM  Result Value Ref Range   Troponin I (High Sensitivity) 36 (H) <18 ng/L    Comment: (NOTE) Elevated high sensitivity troponin I (hsTnI) values and significant  changes across serial measurements may suggest ACS but many other  chronic and acute conditions are known to elevate hsTnI results.  Refer to the "Links" section for chest pain algorithms and additional  guidance. Performed at [facility], [address] [place], [place] [address]    Dg Chest 2 View  Result Date: 01/04/2019 CLINICAL DATA:  Increasing shortness of breath and cough. EXAM: CHEST - 2 VIEW COMPARISON:  CT chest 11/29/2015.  PA and lateral chest 02/01/2014. FINDINGS: The lungs are markedly emphysematous. There is patchy airspace disease in the left upper and lower lobes. Biapical scar is present. Small bilateral pleural effusions are seen. Heart size is normal. Aortic atherosclerosis. Remote L1 and L2 compression fractures are unchanged since the prior CT. The patient has a T7 compression fracture which is new since the prior CT. IMPRESSION: Patchy airspace disease in the left chest worrisome for pneumonia. Emphysema. Small bilateral pleural effusions. Atherosclerosis. Remote L1 and L2 compression fractures. T7 compression fracture is new since 2017 and appears remote. Electronically Signed   By: Inge Rise M.D.   On: 01/04/2019 11:47    Pending Labs Unresulted Labs (From admission, onward)    Start     Ordered   01/05/19 0500  AM - HgA1c  Tomorrow morning,   STAT     01/04/19  1927   01/05/19 0500  AM - FLP  Tomorrow morning,   STAT     01/04/19 1927   01/05/19 0500  CBC with Differential/Platelet  Daily,   STAT     01/04/19 1926   01/05/19 0500  Comprehensive metabolic panel  Daily,   STAT     01/04/19 1926   01/05/19 0500  C-reactive protein  Daily,   STAT     01/04/19 1926   01/05/19 0500  Fibrin derivatives D-Dimer ([place] only)  Daily,   STAT     01/04/19 1926   01/05/19 0500  Ferritin  Daily,   STAT     01/04/19 1926   01/05/19 0500  Magnesium  Daily,   STAT     01/04/19 1926   01/05/19 0500  Protime-INR  Tomorrow morning,   STAT     01/04/19 1742   01/04/19 1927  ABO/Rh  Once,   STAT     01/04/19 1926   01/04/19 1927  C-reactive protein  Once,   STAT     01/04/19 1926   01/04/19 1927  Hepatitis B surface antigen  Once,   STAT     01/04/19 1926   01/04/19 1927  Culture, blood (Routine X 2) w Reflex to ID Panel  BLOOD CULTURE X 2,   STAT     01/04/19 1926   01/04/19 1453  Zinc  Once,   STAT     01/04/19 1453          Vitals/Pain Today's Vitals   01/04/19 2230 01/04/19 2245 01/04/19 2300 01/04/19 2315  BP: 132/90  140/88   Pulse: (!) 107 86 (!) 105 73  Resp:  (!) 21 (!) 23 (!) 27  Temp:      TempSrc:      SpO2: 95% 96% 96% 97%  Weight:      Height:      PainSc:        Isolation Precautions Airborne and Contact precautions  Medications Medications  sodium chloride flush (NS) 0.9 % injection 3 mL (has no administration in time range)  vitamin C (ASCORBIC ACID) tablet 500 mg (500 mg Oral Given 01/04/19 1642)  ipratropium (ATROVENT HFA) inhaler 2 puff (2 puffs Inhalation Given 01/04/19 1915)  albuterol (VENTOLIN HFA) 108 (90 Base) MCG/ACT inhaler 2 puff (2 puffs Inhalation Given 01/04/19 1644)  guaiFENesin (MUCINEX) 12 hr tablet 600 mg (600 mg Oral Given 01/04/19 2133)    And  dextromethorphan (DELSYM) 30 MG/5ML liquid 30 mg (30 mg Oral Given 01/04/19 2134)  senna-docusate (Senokot-S) tablet 1 tablet (1 tablet Oral Given 01/04/19 2133)   remdesivir 200 mg in sodium chloride 0.9% 250 mL IVPB (0 mg Intravenous Stopped 01/04/19 2011)    Followed by  remdesivir 100 mg in sodium chloride 0.9 % 100 mL IVPB (has no administration in time range)  Warfarin - Pharmacist Dosing Inpatient (has no administration in time range)  atorvastatin (LIPITOR) tablet 20 mg (20 mg Oral Given 01/04/19 2133)  diltiazem (CARDIZEM CD) 24 hr capsule 240 mg (240 mg Oral Given 01/04/19 2134)  metoprolol tartrate (LOPRESSOR) tablet 50 mg (50 mg Oral Given 01/04/19 2133)  traZODone (DESYREL) tablet 50 mg (50 mg Oral Given 01/04/19 2133)  dexamethasone (DECADRON) injection 10 mg (10 mg Intravenous Given 01/04/19 1348)  0.9 %  sodium chloride infusion ( Intravenous New Bag/Given 01/04/19 1640)  warfarin (COUMADIN) tablet 7.5 mg (7.5 mg Oral Given 01/04/19 1924)    Mobility walks High fall risk   Focused Assessments Pulmonary Assessment Handoff:  Lung sounds: Bilateral Breath Sounds: Fine crackles(Clear in upper lobes; slight crackles in lower lobes) O2 Device: Nasal Cannula O2 Flow Rate (L/min): 2 L/min      R Recommendations: See Admitting Provider Note  Report given to:   Additional Notes: COVID positive

## 2019-01-05 NOTE — Progress Notes (Signed)
[facility] [place] NAME: [name]    MR#:  [id number]  DATE OF BIRTH:  07/22/1928  SUBJECTIVE:  CHIEF COMPLAINT:   Chief Complaint  Patient presents with  . Shortness of Breath  SOB + REVIEW OF SYSTEMS:  Review of Systems  Constitutional: Negative for diaphoresis, fever, malaise/fatigue and weight loss.  HENT: Negative for ear discharge, ear pain, hearing loss, nosebleeds, sore throat and tinnitus.   Eyes: Negative for blurred vision and pain.  Respiratory: Positive for shortness of breath. Negative for cough, hemoptysis and wheezing.   Cardiovascular: Negative for chest pain, palpitations, orthopnea and leg swelling.  Gastrointestinal: Positive for constipation. Negative for abdominal pain, blood in stool, diarrhea, heartburn, nausea and vomiting.  Genitourinary: Negative for dysuria, frequency and urgency.  Musculoskeletal: Negative for back pain and myalgias.  Skin: Negative for itching and rash.  Neurological: Negative for dizziness, tingling, tremors, focal weakness, seizures, weakness and headaches.  Psychiatric/Behavioral: Negative for depression. The patient is not nervous/anxious.     DRUG ALLERGIES:  No Known Allergies VITALS:  Blood pressure (!) 158/84, pulse 86, temperature 98.4 F (36.9 C), temperature source Oral, resp. rate 19, height 5\' 9"  (1.753 m), weight 59 kg, SpO2 98 %. PHYSICAL EXAMINATION:  Physical Exam HENT:     Head: Normocephalic and atraumatic.  Eyes:     Conjunctiva/sclera: Conjunctivae normal.     Pupils: Pupils are equal, round, and reactive to light.  Neck:     Musculoskeletal: Normal range of motion and neck supple.     Thyroid: No thyromegaly.     Trachea: No tracheal deviation.  Cardiovascular:     Rate and Rhythm: Normal rate and regular rhythm.     Heart sounds: Normal heart sounds.  Pulmonary:     Effort: Pulmonary effort is normal. No respiratory distress.     Breath sounds: Normal breath  sounds. No wheezing.  Chest:     Chest wall: No tenderness.  Abdominal:     General: Bowel sounds are normal. There is no distension.     Palpations: Abdomen is soft.     Tenderness: There is no abdominal tenderness.  Musculoskeletal: Normal range of motion.  Skin:    General: Skin is warm and dry.     Findings: No rash.  Neurological:     Mental Status: He is alert and oriented to person, place, and time.     Cranial Nerves: No cranial nerve deficit.    LABORATORY PANEL:  Male CBC Recent Labs  Lab 01/05/19 0625  WBC 5.6  HGB 13.2  HCT 38.8*  PLT 206   ------------------------------------------------------------------------------------------------------------------ Chemistries  Recent Labs  Lab 01/05/19 0625  NA 138  K 3.1*  CL 99  CO2 27  GLUCOSE 111*  BUN 29*  CREATININE 0.77  CALCIUM 8.4*  MG 2.1  AST 28  ALT 14  ALKPHOS 72  BILITOT 0.7   RADIOLOGY:  No results found. ASSESSMENT AND PLAN:   Acute respiratory disease due to COVID-19 virus: O2 sat 88% on RA -->95% on 2L oxygen  -will admit to[facility] inpt -Remdesivir day 2/5 -Solumedrol20 mg bid -vitamin C, zinc. -bronchodilators -PRN Mucinex for cough -neg Blood culture -Gentle IV fluid -Daily CRP, Ferritin, D-dimer, -Maintain an awake prone position for 16+ hours a day, if possible, with a minimum of 2-3 hours at a time  Atrial fibrillation with RVR ([place]): Hr 100-110s -continue coumadin, therapeutic INR @ 2.1 -metoprolol, cardizem  HTN:  -Continue home medications: metoprolol  and cardizem -IV hydralazine prn  Hx of coronary artery disease and elevated troponin: trop minimally elevated. Due to demand ischemia. No CP.  -ASA and metoprolol - lipitpr -A1c 6.5   HLD: -lipitgor  Constipation: -miralax and senokot      All the records are reviewed and case discussed with Care Management/Social Worker. Management plans discussed with the patient, nursing and they are in  agreement.  CODE STATUS: Full Code  TOTAL TIME TAKING CARE OF THIS PATIENT: 35 minutes.   More than 50% of the time was spent in counseling/coordination of care: YES  POSSIBLE D/C IN 3-4 DAYS, DEPENDING ON CLINICAL CONDITION.   Max Sane M.D on 01/05/2019 at 3:30 PM  Between 7am to 6pm - Pager - [phone]  After 6pm go to [email or web address] - password [email or web address]  [facility]   CC: Primary care physician; Baxter Hire, MD  Note: This dictation was prepared with Dragon dictation along with smaller phrase technology. Any transcriptional errors that result from this process are unintentional.

## 2019-01-05 NOTE — Plan of Care (Signed)

## 2019-01-05 NOTE — Progress Notes (Signed)
Initial Nutrition Assessment  DOCUMENTATION CODES:   Not applicable  INTERVENTION:   Ensure Enlive po TID, each supplement provides 350 kcal and 20 grams of protein  Magic cup TID with meals, each supplement provides 290 kcal and 9 grams of protein  MVI daily   Liberalize diet   Pt likely at refeed risk; recommend monitor K, Mg and P labs daily as oral intake improves.   NUTRITION DIAGNOSIS:   Increased nutrient needs related to acute illness(COVID 19) as evidenced by increased estimated needs.  GOAL:   Patient will meet greater than or equal to 90% of their needs  MONITOR:   PO intake, Supplement acceptance, Labs, Weight trends, Skin, I & O's  REASON FOR ASSESSMENT:   Malnutrition Screening Tool    ASSESSMENT:   85 y.o. male with a past medical history of CAD, hypertension, COPD admitted with COVID 19 and constipation   Pt with poor appetite and oral intake pta r/t COVID 19. Pt continues to have poor appetite and oral intake in hospital. RD will add supplements and MVI to help pt meet his estimated needs. RD will also liberalize pt's diet. Pt likely at refeed risk. Per chart, pt's UBW appears to be ~145-150lbs. Pt appears to have lost 20lbs(13%) since April; RD unsure how recently weight loss occurred.   Pt at high risk for malnutrition but unable to diagnose at this time as NFPE cannot be performed.   Medications reviewed and include: dexamethasone, senokot, vitamin C, wafarin  Labs reviewed: K 3.1(L), BUN 29(H), Mg 2.1 wnl  Unable to complete Nutrition-Focused physical exam at this time as pt with COVID 19.   Diet Order:   Diet Order            Diet Heart Room service appropriate? Yes; Fluid consistency: Thin  Diet effective now             EDUCATION NEEDS:   No education needs have been identified at this time  Skin:  Skin Assessment: Reviewed RN Assessment  Last BM:  12/4  Height:   Ht Readings from Last 1 Encounters:  01/04/19 5\' 9"  (1.753 m)     Weight:   Wt Readings from Last 1 Encounters:  01/04/19 59 kg    Ideal Body Weight:  72.7 kg  BMI:  Body mass index is 19.2 kg/m.  Estimated Nutritional Needs:   Kcal:  1700-2000kcal/day  Protein:  85-100g/day  Fluid:  >1.5L/day  Koleen Distance MS, RD, LDN Pager #- [phone] Office#- [phone] After Hours Pager: [phone]

## 2019-01-05 NOTE — Progress Notes (Addendum)
ANTICOAGULATION CONSULT NOTE  Pharmacy Consult for warfarin Indication: atrial fibrillation  No Known Allergies   Vital Signs: Temp: 97.5 F (36.4 C) (12/08 0237) Temp Source: Oral (12/08 0237) BP: 130/74 (12/08 0237) Pulse Rate: 70 (12/08 0237)  Labs: Recent Labs    01/04/19 1117  01/04/19 1521 01/04/19 1923 01/04/19 2057 01/05/19 0625  HGB 12.3*  --   --   --   --  13.2  HCT 36.8*  --   --   --   --  38.8*  PLT 208  --   --   --   --  206  LABPROT  --   --  19.5*  --   --   --   INR  --   --  1.7*  --   --   --   CREATININE 0.84  --   --   --   --   --   TROPONINIHS  --    < > 48* 36* 36*  --    < > = values in this interval not displayed.    Estimated Creatinine Clearance: 50.7 mL/min (by C-G formula based on SCr of 0.84 mg/dL).   Medical History: Past Medical History:  Diagnosis Date  . Coronary artery disease   . Hypertension     Assessment: 85 year old male with h/o afib on warfarin PTA. Patient also COVID+ being treated with remdesivir. Appears patient is taking warfarin 5 mg daily PTA. Based on records in [facility], patient therapeutic on this regimen for the past several months. Pharmacy consulted to continue warfarin.  Date INR Dose 12/7 1.7 7.5 mg 12/8 2.1   Goal of Therapy:  INR 2-3 Monitor platelets by anticoagulation protocol: Yes   Plan:  INR therapeutic.Will  Order warfarin 6 mg x 1 today and anticipate resume home dose tomorrow.(timed med for 2200 with other meds in COVID pt)  F/u INR in am  Smera Guyette A, PharmD 01/05/2019,7:47 AM

## 2019-01-05 NOTE — ED Notes (Signed)
Chat message sent to [facility] regarding blood type order that lab called about possibly being an incorrect order.

## 2019-01-06 ENCOUNTER — Inpatient Hospital Stay
Admit: 2019-01-06 | Discharge: 2019-01-06 | Disposition: A | Discharge status: Home / Self Care | Payer: Medicare Other | Attending: Internal Medicine | Admitting: Internal Medicine

## 2019-01-06 DIAGNOSIS — E785 Hyperlipidemia, unspecified: Secondary | ICD-10-CM

## 2019-01-06 LAB — PROTIME-INR
INR: 3.2 — ABNORMAL HIGH (ref 0.8–1.2)
Prothrombin Time: 32.3 seconds — ABNORMAL HIGH (ref 11.4–15.2)

## 2019-01-06 LAB — CBC WITH DIFFERENTIAL/PLATELET
Abs Immature Granulocytes: 0.03 10*3/uL (ref 0.00–0.07)
Basophils Absolute: 0 10*3/uL (ref 0.0–0.1)
Basophils Relative: 0 %
Eosinophils Absolute: 0 10*3/uL (ref 0.0–0.5)
Eosinophils Relative: 0 %
HCT: 35.3 % — ABNORMAL LOW (ref 39.0–52.0)
Hemoglobin: 12 g/dL — ABNORMAL LOW (ref 13.0–17.0)
Immature Granulocytes: 1 %
Lymphocytes Relative: 19 %
Lymphs Abs: 0.9 10*3/uL (ref 0.7–4.0)
MCH: 29.4 pg (ref 26.0–34.0)
MCHC: 34 g/dL (ref 30.0–36.0)
MCV: 86.5 fL (ref 80.0–100.0)
Monocytes Absolute: 0.6 10*3/uL (ref 0.1–1.0)
Monocytes Relative: 12 %
Neutro Abs: 3.3 10*3/uL (ref 1.7–7.7)
Neutrophils Relative %: 68 %
Platelets: 183 10*3/uL (ref 150–400)
RBC: 4.08 MIL/uL — ABNORMAL LOW (ref 4.22–5.81)
RDW: 13.8 % (ref 11.5–15.5)
WBC: 4.8 10*3/uL (ref 4.0–10.5)
nRBC: 0 % (ref 0.0–0.2)

## 2019-01-06 LAB — COMPREHENSIVE METABOLIC PANEL
ALT: 16 U/L (ref 0–44)
AST: 26 U/L (ref 15–41)
Albumin: 2.8 g/dL — ABNORMAL LOW (ref 3.5–5.0)
Alkaline Phosphatase: 63 U/L (ref 38–126)
Anion gap: 10 (ref 5–15)
BUN: 39 mg/dL — ABNORMAL HIGH (ref 8–23)
CO2: 29 mmol/L (ref 22–32)
Calcium: 8.1 mg/dL — ABNORMAL LOW (ref 8.9–10.3)
Chloride: 101 mmol/L (ref 98–111)
Creatinine, Ser: 0.74 mg/dL (ref 0.61–1.24)
GFR calc Af Amer: >60 mL/min (ref >60)
GFR calc non Af Amer: >60 mL/min (ref >60)
Glucose, Bld: 124 mg/dL — ABNORMAL HIGH (ref 70–99)
Potassium: 3.3 mmol/L — ABNORMAL LOW (ref 3.5–5.1)
Sodium: 140 mmol/L (ref 135–145)
Total Bilirubin: 0.7 mg/dL (ref 0.3–1.2)
Total Protein: 6.1 g/dL — ABNORMAL LOW (ref 6.5–8.1)

## 2019-01-06 LAB — C-REACTIVE PROTEIN: CRP: 6 mg/dL — ABNORMAL HIGH (ref <1.0)

## 2019-01-06 LAB — FERRITIN: Ferritin: 195 ng/mL (ref 24–336)

## 2019-01-06 LAB — MAGNESIUM: Magnesium: 2.2 mg/dL (ref 1.7–2.4)

## 2019-01-06 LAB — ZINC: Zinc: 53 ug/dL — ABNORMAL LOW (ref 56–134)

## 2019-01-06 LAB — FIBRIN DERIVATIVES D-DIMER (ARMC ONLY): Fibrin derivatives D-dimer (ARMC): 2697.25 ng/mL (FEU) — ABNORMAL HIGH (ref 0.00–499.00)

## 2019-01-06 MED ORDER — POTASSIUM CHLORIDE CRYS ER 20 MEQ PO TBCR
40.0000 meq | EXTENDED_RELEASE_TABLET | Freq: Once | ORAL | Status: AC
  Administered 2019-01-06: 40 meq via ORAL
  Filled 2019-01-06: qty 2

## 2019-01-06 MED ORDER — ZINC SULFATE 220 (50 ZN) MG PO CAPS
220.0000 mg | ORAL_CAPSULE | Freq: Every day | ORAL | Status: DC
  Administered 2019-01-07 – 2019-01-08 (×2): 220 mg via ORAL
  Filled 2019-01-06 (×2): qty 1

## 2019-01-06 NOTE — Progress Notes (Signed)
ANTICOAGULATION CONSULT NOTE  Pharmacy Consult for warfarin Indication: atrial fibrillation  No Known Allergies   Vital Signs: Temp: 98.2 F (36.8 C) (12/09 0514) Temp Source: Oral (12/09 0514) BP: 152/90 (12/09 0514) Pulse Rate: 95 (12/09 0514)  Labs: Recent Labs    01/04/19 1117  01/04/19 1521 01/04/19 1923 01/04/19 2057 01/05/19 0625 01/06/19 0544  HGB 12.3*  --   --   --   --  13.2 12.0*  HCT 36.8*  --   --   --   --  38.8* 35.3*  PLT 208  --   --   --   --  206 183  LABPROT  --   --  19.5*  --   --  23.4* 32.3*  INR  --   --  1.7*  --   --  2.1* 3.2*  CREATININE 0.84  --   --   --   --  0.77 0.74  TROPONINIHS  --    < > 48* 36* 36*  --   --    < > = values in this interval not displayed.    Estimated Creatinine Clearance: 53.3 mL/min (by C-G formula based on SCr of 0.74 mg/dL).   Medical History: Past Medical History:  Diagnosis Date  . Coronary artery disease   . Hypertension     Assessment: 85 year old male with h/o afib on warfarin PTA. Patient also COVID+ being treated with remdesivir. Appears patient is taking warfarin 5 mg daily PTA. Based on records in [facility], patient therapeutic on this regimen for the past several months. No major DDI. Pharmacy consulted to continue warfarin.  Date INR Dose 12/7 1.7 7.5 mg 12/8 2.1       6 mg 12/9     3.2       HOLD   Goal of Therapy:  INR 2-3 Monitor platelets by anticoagulation protocol: Yes   Plan:  INR supratherapeutic.Will hold warfarin today and anticipate resume home dose tomorrow. Daily INR order. Check CBC at least every 3 days.   Oswald Hillock, PharmD, BCPS 01/06/2019,8:29 AM

## 2019-01-06 NOTE — Plan of Care (Signed)
  Problem: Education: Goal: Emotional status will improve Outcome: Progressing Goal: Mental status will improve Outcome: Progressing   

## 2019-01-06 NOTE — Progress Notes (Addendum)
[place] [facility] [place] NAME: [name]    MR#:  [id number]  DATE OF BIRTH:  May 28, 1928  SUBJECTIVE:  CHIEF COMPLAINT:   Chief Complaint  Patient presents with  . Shortness of Breath  SOB improving.  No new complaints REVIEW OF SYSTEMS:  Review of Systems  Constitutional: Negative for diaphoresis, fever, malaise/fatigue and weight loss.  HENT: Negative for ear discharge, ear pain, hearing loss, nosebleeds, sore throat and tinnitus.   Eyes: Negative for blurred vision and pain.  Respiratory: Positive for shortness of breath. Negative for cough, hemoptysis and wheezing.   Cardiovascular: Negative for chest pain, palpitations, orthopnea and leg swelling.  Gastrointestinal: Positive for constipation. Negative for abdominal pain, blood in stool, diarrhea, heartburn, nausea and vomiting.  Genitourinary: Negative for dysuria, frequency and urgency.  Musculoskeletal: Negative for back pain and myalgias.  Skin: Negative for itching and rash.  Neurological: Negative for dizziness, tingling, tremors, focal weakness, seizures, weakness and headaches.  Psychiatric/Behavioral: Negative for depression. The patient is not nervous/anxious.     DRUG ALLERGIES:  No Known Allergies VITALS:  Blood pressure (!) 156/85, pulse 76, temperature 97.7 F (36.5 C), temperature source Oral, resp. rate 19, height 5\' 9"  (1.753 m), weight 59 kg, SpO2 95 %. PHYSICAL EXAMINATION:  Physical Exam HENT:     Head: Normocephalic and atraumatic.  Eyes:     Conjunctiva/sclera: Conjunctivae normal.     Pupils: Pupils are equal, round, and reactive to light.  Neck:     Musculoskeletal: Normal range of motion and neck supple.     Thyroid: No thyromegaly.     Trachea: No tracheal deviation.  Cardiovascular:     Rate and Rhythm: Normal rate and regular rhythm.     Heart sounds: Normal heart sounds.  Pulmonary:     Effort: Pulmonary effort is normal. No respiratory distress.     Breath  sounds: Normal breath sounds. No wheezing.  Chest:     Chest wall: No tenderness.  Abdominal:     General: Bowel sounds are normal. There is no distension.     Palpations: Abdomen is soft.     Tenderness: There is no abdominal tenderness.  Musculoskeletal: Normal range of motion.  Skin:    General: Skin is warm and dry.     Findings: No rash.  Neurological:     Mental Status: He is alert and oriented to person, place, and time.     Cranial Nerves: No cranial nerve deficit.    LABORATORY PANEL:  Male CBC Recent Labs  Lab 01/06/19 0544  WBC 4.8  HGB 12.0*  HCT 35.3*  PLT 183   ------------------------------------------------------------------------------------------------------------------ Chemistries  Recent Labs  Lab 01/06/19 0544  NA 140  K 3.3*  CL 101  CO2 29  GLUCOSE 124*  BUN 39*  CREATININE 0.74  CALCIUM 8.1*  MG 2.2  AST 26  ALT 16  ALKPHOS 63  BILITOT 0.7   RADIOLOGY:  No results found. ASSESSMENT AND PLAN:   Acute hypoxic respiratory failure due to COVID-19 virus: O2 sat 88% on RA -->95% on 2L oxygen -Remdesivir day 3/5 -Solumedrol20 mg bid day 3/10 -vitamin C, zinc. -bronchodilators -PRN Mucinex for cough -neg Blood culture -Gentle IV fluid -Daily CRP, Ferritin, D-dimer, -Maintain an awake prone position for 16+ hours a day, if possible, with a minimum of 2-3 hours at a time -Considering elevated BNP, will check echo as his last echo was in 2016  Atrial fibrillation with RVR ([place]): Hr 70  to 52s -continue coumadin, therapeutic INR  -Continue metoprolol, cardizem  HTN:  -Continue home medications: metoprolol and cardizem -IV hydralazine prn  Hx of coronary artery disease and elevated troponin: trop minimally elevated. Due to demand ischemia. No CP.  -ASA and metoprolol - lipitpr -A1c 6.5   HLD: -On Lipitor  Constipation: -miralax and senokot  Hypokalemia Replete and recheck    Palliative care consultation for  goals of care  All the records are reviewed and case discussed with Care Management/Social Worker. Management plans discussed with the patient, nursing and they are in agreement.  CODE STATUS: Full Code  TOTAL TIME TAKING CARE OF THIS PATIENT: 35 minutes.   More than 50% of the time was spent in counseling/coordination of care: YES  POSSIBLE D/C IN 2 DAYS, DEPENDING ON CLINICAL CONDITION.   Max Sane M.D on 01/06/2019 at 2:51 PM  Between 7am to 6pm - Pager - [phone]  After 6pm go to [email or web address] - password [email or web address]  [facility]   CC: Primary care physician; Baxter Hire, MD  Note: This dictation was prepared with Dragon dictation along with smaller phrase technology. Any transcriptional errors that result from this process are unintentional.

## 2019-01-07 DIAGNOSIS — Z7189 Other specified counseling: Secondary | ICD-10-CM

## 2019-01-07 DIAGNOSIS — Z515 Encounter for palliative care: Secondary | ICD-10-CM

## 2019-01-07 LAB — CBC WITH DIFFERENTIAL/PLATELET
Abs Immature Granulocytes: 0.04 10*3/uL (ref 0.00–0.07)
Basophils Absolute: 0 10*3/uL (ref 0.0–0.1)
Basophils Relative: 0 %
Eosinophils Absolute: 0 10*3/uL (ref 0.0–0.5)
Eosinophils Relative: 0 %
HCT: 37.3 % — ABNORMAL LOW (ref 39.0–52.0)
Hemoglobin: 12.3 g/dL — ABNORMAL LOW (ref 13.0–17.0)
Immature Granulocytes: 1 %
Lymphocytes Relative: 21 %
Lymphs Abs: 1.1 10*3/uL (ref 0.7–4.0)
MCH: 29.6 pg (ref 26.0–34.0)
MCHC: 33 g/dL (ref 30.0–36.0)
MCV: 89.7 fL (ref 80.0–100.0)
Monocytes Absolute: 0.7 10*3/uL (ref 0.1–1.0)
Monocytes Relative: 14 %
Neutro Abs: 3.2 10*3/uL (ref 1.7–7.7)
Neutrophils Relative %: 64 %
Platelets: 191 10*3/uL (ref 150–400)
RBC: 4.16 MIL/uL — ABNORMAL LOW (ref 4.22–5.81)
RDW: 13.8 % (ref 11.5–15.5)
WBC: 5 10*3/uL (ref 4.0–10.5)
nRBC: 0 % (ref 0.0–0.2)

## 2019-01-07 LAB — PROTIME-INR
INR: 3.5 — ABNORMAL HIGH (ref 0.8–1.2)
Prothrombin Time: 35.4 seconds — ABNORMAL HIGH (ref 11.4–15.2)

## 2019-01-07 LAB — COMPREHENSIVE METABOLIC PANEL
ALT: 16 U/L (ref 0–44)
AST: 27 U/L (ref 15–41)
Albumin: 2.7 g/dL — ABNORMAL LOW (ref 3.5–5.0)
Alkaline Phosphatase: 59 U/L (ref 38–126)
Anion gap: 9 (ref 5–15)
BUN: 39 mg/dL — ABNORMAL HIGH (ref 8–23)
CO2: 28 mmol/L (ref 22–32)
Calcium: 8.1 mg/dL — ABNORMAL LOW (ref 8.9–10.3)
Chloride: 102 mmol/L (ref 98–111)
Creatinine, Ser: 0.67 mg/dL (ref 0.61–1.24)
GFR calc Af Amer: >60 mL/min (ref >60)
GFR calc non Af Amer: >60 mL/min (ref >60)
Glucose, Bld: 104 mg/dL — ABNORMAL HIGH (ref 70–99)
Potassium: 3.5 mmol/L (ref 3.5–5.1)
Sodium: 139 mmol/L (ref 135–145)
Total Bilirubin: 0.8 mg/dL (ref 0.3–1.2)
Total Protein: 5.9 g/dL — ABNORMAL LOW (ref 6.5–8.1)

## 2019-01-07 LAB — FERRITIN: Ferritin: 149 ng/mL (ref 24–336)

## 2019-01-07 LAB — C-REACTIVE PROTEIN: CRP: 3.7 mg/dL — ABNORMAL HIGH (ref <1.0)

## 2019-01-07 LAB — MAGNESIUM: Magnesium: 2 mg/dL (ref 1.7–2.4)

## 2019-01-07 LAB — FIBRIN DERIVATIVES D-DIMER (ARMC ONLY): Fibrin derivatives D-dimer (ARMC): 2714.59 ng/mL (FEU) — ABNORMAL HIGH (ref 0.00–499.00)

## 2019-01-07 LAB — ECHOCARDIOGRAM COMPLETE
Height: 69 in
Weight: 2080 oz

## 2019-01-07 MED ORDER — POTASSIUM CHLORIDE CRYS ER 20 MEQ PO TBCR
40.0000 meq | EXTENDED_RELEASE_TABLET | Freq: Once | ORAL | Status: AC
  Administered 2019-01-07: 40 meq via ORAL
  Filled 2019-01-07: qty 2

## 2019-01-07 NOTE — Progress Notes (Signed)
ANTICOAGULATION CONSULT NOTE  Pharmacy Consult for warfarin Indication: atrial fibrillation  No Known Allergies   Vital Signs: Temp: 97.9 F (36.6 C) (12/10 0824) Temp Source: Oral (12/10 0824) BP: 166/75 (12/10 0824) Pulse Rate: 69 (12/10 0824)  Labs: Recent Labs    01/04/19 1521 01/04/19 1923 01/04/19 2057 01/05/19 0625 01/06/19 0544 01/07/19 0716  HGB  --   --   --  13.2 12.0* 12.3*  HCT  --   --   --  38.8* 35.3* 37.3*  PLT  --   --   --  206 183 191  LABPROT 19.5*  --   --  23.4* 32.3* 35.4*  INR 1.7*  --   --  2.1* 3.2* 3.5*  CREATININE  --   --   --  0.77 0.74 0.67  TROPONINIHS 48* 36* 36*  --   --   --     Estimated Creatinine Clearance: 53.3 mL/min (by C-G formula based on SCr of 0.67 mg/dL).   Medical History: Past Medical History:  Diagnosis Date  . Coronary artery disease   . Hypertension     Assessment: 85 year old male with h/o afib on warfarin PTA. Patient also COVID+ being treated with remdesivir. Appears patient is taking warfarin 5 mg daily PTA. Based on records in [facility], patient therapeutic on this regimen for the past several months. No major DDI. Pharmacy consulted to continue warfarin.  Date INR Dose 12/7 1.7 7.5 mg 12/8 2.1       6 mg 12/9     3.2       HOLD 12/10   3.5       HOLD   Goal of Therapy:  INR 2-3 Monitor platelets by anticoagulation protocol: Yes   Plan:  INR supratherapeutic and trending up.Will hold warfarin today and anticipate resume home dose tomorrow. Daily INR order. Check CBC at least every 3 days.   If discharging today: recommend hold warfarin dose for one more day and resume home dose of 5 mg and follow up with clinic/INR check within 3 days.   Oswald Hillock, PharmD, BCPS 01/07/2019,8:44 AM

## 2019-01-07 NOTE — Consult Note (Signed)
Consultation Note Date: 01/07/2019   Patient Name: [name]  DOB: 04-14-1928  MRN: [id number]  Age / Sex: 85 y.o., male  PCP: Baxter Hire, MD Referring Physician: Max Sane, MD  Reason for Consultation: Establishing goals of care  HPI/Patient Profile: [name] is a 85 y.o. male with medical history significant of hypertension, atrial fibrillation on Coumadin, CAD, who presents with shortness of breath and constipation.  Clinical Assessment and Goals of Care: Patient is on covid isolation. Spoke with him via phone. He states he is happy to be going home tomorrow. He states his wife died 2022/12/23 after fighting Chron's Disease for 50 years. His daughter has been staying with him during the day and going home at night.   At times he uses a cane. He is able to do things for himself, but takes sponge baths as he is afraid he will fall in the shower/bathtub. He states his appetite is good.    We discussed his diagnosis, prognosis, and GOC.  A detailed discussion was had today regarding advanced directives.  Concepts specific to code status, artifical feeding and hydration, IV antibiotics and rehospitalization were discussed. Values and goals of care important to patient and family were attempted to be elicited.  He states his daughter knows what to do for him as far as care if he is unable to make decisions. He states he feels like if it is an issue that can be quickly fixed, he would like to treat it. He states he would not want to live on a ventilator, but would want to be placed on one for a few days to see how he does. He would want to try CPR for a few minutes. He would not want a feeding tube.    SUMMARY OF RECOMMENDATIONS   He is amenable to palliative outpatient.  Prognosis:   Unable to determine  Discharge Planning: Home with Palliative Services      Primary  Diagnoses: Present on Admission: . Atrial fibrillation with RVR ([place]) . Hypertension . Coronary artery disease . Elevated troponin . Acute respiratory disease due to COVID-19 virus . Constipation . HLD (hyperlipidemia)   I have reviewed the medical record, interviewed the patient and family, and examined the patient. The following aspects are pertinent.  Past Medical History:  Diagnosis Date  . Coronary artery disease   . Hypertension    Social History   Socioeconomic History  . Marital status: Married    Spouse name: Not on file  . Number of children: Not on file  . Years of education: Not on file  . Highest education level: Not on file  Occupational History  . Not on file  Tobacco Use  . Smoking status: Former Research scientist (life sciences)  . Smokeless tobacco: Never Used  Substance and Sexual Activity  . Alcohol use: Never  . Drug use: Never  . Sexual activity: Not on file  Other Topics Concern  . Not on file  Social History Narrative  . Not on file  Social Determinants of Health   Financial Resource Strain:   . Difficulty of Paying Living Expenses: Not on file  Food Insecurity:   . Worried About Charity fundraiser in the Last Year: Not on file  . Ran Out of Food in the Last Year: Not on file  Transportation Needs:   . Lack of Transportation (Medical): Not on file  . Lack of Transportation (Non-Medical): Not on file  Physical Activity:   . Days of Exercise per Week: Not on file  . Minutes of Exercise per Session: Not on file  Stress:   . Feeling of Stress : Not on file  Social Connections:   . Frequency of Communication with Friends and Family: Not on file  . Frequency of Social Gatherings with Friends and Family: Not on file  . Attends Religious Services: Not on file  . Active Member of Clubs or Organizations: Not on file  . Attends Archivist Meetings: Not on file  . Marital Status: Not on file   No family history on file. Scheduled Meds: . atorvastatin  20  mg Oral Daily  . dexamethasone (DECADRON) injection  6 mg Intravenous Q24H  . guaiFENesin  600 mg Oral BID   And  . dextromethorphan  30 mg Oral BID  . diltiazem  240 mg Oral Daily  . feeding supplement (ENSURE ENLIVE)  237 mL Oral TID BM  . ipratropium  2 puff Inhalation Q4H  . metoprolol tartrate  50 mg Oral BID  . multivitamin with minerals  1 tablet Oral Daily  . senna-docusate  1 tablet Oral BID  . traZODone  50 mg Oral QHS  . vitamin C  500 mg Oral Daily  . Warfarin - Pharmacist Dosing Inpatient   Does not apply q1800  . zinc sulfate  220 mg Oral Daily   Continuous Infusions: . remdesivir 100 mg in NS 100 mL 100 mg (01/07/19 0944)   PRN Meds:.acetaminophen, albuterol Medications Prior to Admission:  Prior to Admission medications   Medication Sig Start Date End Date Taking? Authorizing Provider  atorvastatin (LIPITOR) 20 MG tablet Take 20 mg by mouth daily.   Yes [provider]  diltiazem (CARDIZEM CD) 240 MG 24 hr capsule Take 240 mg by mouth daily.   Yes [provider]  metoprolol tartrate (LOPRESSOR) 50 MG tablet Take 50 mg by mouth 2 (two) times daily.   Yes [provider]  traZODone (DESYREL) 50 MG tablet Take 50 mg by mouth at bedtime. 11/18/18 11/18/19 Yes [provider]  warfarin (COUMADIN) 2 MG tablet Take 2 mg by mouth daily.   Yes [provider]  warfarin (COUMADIN) 3 MG tablet Take 3 mg by mouth daily. 09/01/18  Yes [provider]   No Known Allergies Review of Systems  All other systems reviewed and are negative.    Vital Signs: BP (!) 166/75 (BP Location: Left Arm)   Pulse 69   Temp 97.9 F (36.6 C) (Oral)   Resp 19   Ht 5\' 9"  (1.753 m)   Wt 59 kg   SpO2 95%   BMI 19.20 kg/m  Pain Scale: 0-10 POSS *See Group Information*: 1-Acceptable,Awake and alert Pain Score: 0-No pain   SpO2: SpO2: 95 % O2 Device:SpO2: 95 % O2 Flow Rate: .O2 Flow Rate (L/min): 1 L/min  IO: Intake/output summary:    Intake/Output Summary (Last 24 hours) at 01/07/2019 1539 Last data filed at 01/06/2019 1630 Gross per 24 hour  Intake --  Output 0 ml  Net 0 ml    LBM: Last BM Date: 01/06/19 Baseline Weight: Weight: 59 kg Most recent weight: Weight: 59 kg     Palliative Assessment/Data:50%    COVID-19 DISASTER DECLARATION:    PHYSICAL EXAMINATION WAS NOT POSSIBLE DUE TO TREATMENT OF COVID-19  AND CONSERVATION OF PERSONAL PROTECTIVE EQUIPMENT.   Patient assessed or the symptoms described in the history of present illness.  In the context of the Global COVID-19 pandemic, which necessitated consideration that the patient might be at risk for infection with the SARS-CoV-2 virus that causes COVID-19, Institutional protocols and algorithms that pertain to the evaluation of patients at risk for COVID-19 are in a state of rapid change based on information released by regulatory bodies including the CDC and federal and state organizations. These policies and algorithms were followed during the patient's care while in hospital.   Time In: 3:30 Time Out: 4:08 Time Total: 38 min Greater than 50%  of this time was spent counseling and coordinating care related to the above assessment and plan.  Signed by: Asencion Gowda, NP   Please contact Palliative Medicine Team phone at [phone] for questions and concerns.  For individual provider: See Shea Evans

## 2019-01-07 NOTE — Progress Notes (Signed)
at [place] NAME: Gemari Kendal    MR#:  [id number]  DATE OF BIRTH:  12/28/1928  SUBJECTIVE:  CHIEF COMPLAINT:   Chief Complaint  Patient presents with  . Shortness of Breath  SOB improving.  No new complaints REVIEW OF SYSTEMS:  Review of Systems  Constitutional: Negative for diaphoresis, fever, malaise/fatigue and weight loss.  HENT: Negative for ear discharge, ear pain, hearing loss, nosebleeds, sore throat and tinnitus.   Eyes: Negative for blurred vision and pain.  Respiratory: Positive for shortness of breath. Negative for cough, hemoptysis and wheezing.   Cardiovascular: Negative for chest pain, palpitations, orthopnea and leg swelling.  Gastrointestinal: Positive for constipation. Negative for abdominal pain, blood in stool, diarrhea, heartburn, nausea and vomiting.  Genitourinary: Negative for dysuria, frequency and urgency.  Musculoskeletal: Negative for back pain and myalgias.  Skin: Negative for itching and rash.  Neurological: Negative for dizziness, tingling, tremors, focal weakness, seizures, weakness and headaches.  Psychiatric/Behavioral: Negative for depression. The patient is not nervous/anxious.     DRUG ALLERGIES:  No Known Allergies VITALS:  Blood pressure (!) 166/75, pulse 69, temperature 97.9 F (36.6 C), temperature source Oral, resp. rate 19, height 5\' 9"  (1.753 m), weight 59 kg, SpO2 95 %. PHYSICAL EXAMINATION:  Physical Exam HENT:     Head: Normocephalic and atraumatic.  Eyes:     Conjunctiva/sclera: Conjunctivae normal.     Pupils: Pupils are equal, round, and reactive to light.  Neck:     Thyroid: No thyromegaly.     Trachea: No tracheal deviation.  Cardiovascular:     Rate and Rhythm: Normal rate and regular rhythm.     Heart sounds: Normal heart sounds.  Pulmonary:     Effort: Pulmonary effort is normal. No respiratory distress.     Breath sounds: Normal breath sounds. No wheezing.  Chest:     Chest  wall: No tenderness.  Abdominal:     General: Bowel sounds are normal. There is no distension.     Palpations: Abdomen is soft.     Tenderness: There is no abdominal tenderness.  Musculoskeletal:        General: Normal range of motion.     Cervical back: Normal range of motion and neck supple.  Skin:    General: Skin is warm and dry.     Findings: No rash.  Neurological:     Mental Status: He is alert and oriented to person, place, and time.     Cranial Nerves: No cranial nerve deficit.    LABORATORY PANEL:  Male CBC Recent Labs  Lab 01/07/19 0716  WBC 5.0  HGB 12.3*  HCT 37.3*  PLT 191   ------------------------------------------------------------------------------------------------------------------ Chemistries  Recent Labs  Lab 01/07/19 0716  NA 139  K 3.5  CL 102  CO2 28  GLUCOSE 104*  BUN 39*  CREATININE 0.67  CALCIUM 8.1*  MG 2.0  AST 27  ALT 16  ALKPHOS 59  BILITOT 0.8   RADIOLOGY:  ECHOCARDIOGRAM COMPLETE  Result Date: 01/07/2019   ECHOCARDIOGRAM REPORT   Patient Name:   CORELL ORLOFF Date of Exam: 01/06/2019 Medical Rec #:  [id number]         Height:       69.0 in Accession #:    [id number]        Weight:       130.0 lb Date of Birth:  1928-11-25         BSA:  1.72 m Patient Age:    17 years          BP:           128/74 mmHg Patient Gender: M                 HR:           84 bpm. Exam Location:  [facility] Procedure: 2D Echo, Cardiac Doppler and Color Doppler Indications:     COVID-19                  R06.00 Dyspnea  History:         Patient has no prior history of Echocardiogram examinations.                  Risk Factors:Hypertension. Coronary artery disease.  Sonographer:     Wilford Sports Rodgers-Jones Referring Phys:  [place] Diagnosing Phys: Bartholome Bill MD IMPRESSIONS  1. Left ventricular ejection fraction, by visual estimation, is 55 to 60%. The left ventricle has normal function. Left ventricular septal wall thickness was mildly increased.  Mildly increased left ventricular posterior wall thickness. There is mildly increased left ventricular hypertrophy.  2. Left ventricular diastolic parameters are consistent with Grade I diastolic dysfunction (impaired relaxation).  3. Global right ventricle has normal systolic function.The right ventricular size is mildly enlarged. No increase in right ventricular wall thickness.  4. Left atrial size was mildly dilated.  5. Right atrial size was mildly dilated.  6. The mitral valve is grossly normal. Mild to moderate mitral valve regurgitation.  7. The tricuspid valve is grossly normal. Tricuspid valve regurgitation is trivial.  8. The aortic valve is abnormal. Aortic valve regurgitation is trivial. Mild to moderate aortic valve sclerosis/calcification without any evidence of aortic stenosis.  9. The pulmonic valve was not well visualized. Pulmonic valve regurgitation is trivial. 10. Moderately elevated pulmonary artery systolic pressure. 11. The atrial septum is grossly normal. FINDINGS  Left Ventricle: Left ventricular ejection fraction, by visual estimation, is 55 to 60%. The left ventricle has normal function. The left ventricle is not well visualized. Mildly increased left ventricular posterior wall thickness. There is mildly increased left ventricular hypertrophy. Left ventricular diastolic parameters are consistent with Grade I diastolic dysfunction (impaired relaxation). Right Ventricle: The right ventricular size is mildly enlarged. No increase in right ventricular wall thickness. Global RV systolic function is has normal systolic function. The tricuspid regurgitant velocity is 3.17 m/s, and with an assumed right atrial  pressure of 10 mmHg, the estimated right ventricular systolic pressure is moderately elevated at 50.3 mmHg. Left Atrium: Left atrial size was mildly dilated. Right Atrium: Right atrial size was mildly dilated Pericardium: There is no evidence of pericardial effusion. There is a small  pleural effusion. Mitral Valve: The mitral valve is grossly normal. Mild to moderate mitral valve regurgitation. Tricuspid Valve: The tricuspid valve is grossly normal. Tricuspid valve regurgitation is trivial. Aortic Valve: The aortic valve is abnormal. Aortic valve regurgitation is trivial. Aortic regurgitation PHT measures 333 msec. Mild to moderate aortic valve sclerosis/calcification is present, without any evidence of aortic stenosis. Pulmonic Valve: The pulmonic valve was not well visualized. Pulmonic valve regurgitation is trivial. Pulmonic regurgitation is trivial. Aorta: The aortic root is normal in size and structure. IAS/Shunts: The atrial septum is grossly normal.  LEFT VENTRICLE PLAX 2D LVIDd:         4.29 cm LVIDs:         3.09 cm LV PW:  1.01 cm LV IVS:        0.83 cm LVOT diam:     2.00 cm LV SV:         45 ml LV SV Index:   26.70 LVOT Area:     3.14 cm  RIGHT VENTRICLE RV Basal diam:  4.84 cm RV S prime:     9.37 cm/s TAPSE (M-mode): 1.2 cm LEFT ATRIUM             Index       RIGHT ATRIUM           Index LA diam:        5.50 cm 3.20 cm/m  RA Area:     24.00 cm LA Vol (A2C):   80.7 ml 46.91 ml/m RA Volume:   86.20 ml  50.11 ml/m LA Vol (A4C):   70.7 ml 41.10 ml/m LA Biplane Vol: 76.9 ml 44.70 ml/m  AORTIC VALVE LVOT Vmax:   97.40 cm/s LVOT Vmean:  64.300 cm/s LVOT VTI:    0.184 m AI PHT:      333 msec  AORTA Ao Root diam: 3.30 cm MV E velocity: 101.17 cm/s 103 cm/s TRICUSPID VALVE                                     TR Peak grad:   40.3 mmHg                                     TR Vmax:        354.00 cm/s                                      SHUNTS                                     Systemic VTI:  0.18 m                                     Systemic Diam: 2.00 cm  Bartholome Bill MD Electronically signed by Bartholome Bill MD Signature Date/Time: 01/07/2019/9:32:50 AM    Final    ASSESSMENT AND PLAN:   Acute hypoxic respiratory failure due to COVID-19 virus:on 1L oxygen -Remdesivir day  4/5 -Solumedrol20 mg bid day 4/10 -vitamin C, zinc. -bronchodilators -PRN Mucinex for cough -neg Blood culture -Gentle IV fluid -Daily CRP, Ferritin, D-dimer, -Maintain an awake prone position for 16+ hours a day, if possible, with a minimum of 2-3 hours at a time -Echo wnl  Atrial fibrillation with RVR ([place]): Hr 70 to 80s -Holding coumadin 12/9 & 12/10, INR 3.5 -Continue metoprolol, cardizem  HTN:  -Continue home medications: metoprolol and cardizem -IV hydralazine prn  Hx of coronary artery disease and elevated troponin: trop minimally elevated. Due to demand ischemia. No CP.  -ASA and metoprolol - lipitor -A1c 6.5   HLD: -On Lipitor  Constipation: -miralax and senokot  Hypokalemia Repleted and resolved   Palliative care consultation still pending to go over for goals of care   All the records are reviewed and case discussed with Care Management/Social  Worker. Management plans discussed with the patient, nursing and they are in agreement.  CODE STATUS: Full Code  TOTAL TIME TAKING CARE OF THIS PATIENT: 35 minutes.   More than 50% of the time was spent in counseling/coordination of care: YES  POSSIBLE D/C IN 1 DAYS, DEPENDING ON CLINICAL CONDITION.   Max Sane M.D on 01/07/2019 at 1:57 PM  Between 7am to 6pm - Pager - [phone]  After 6pm go to [email or web address] - password [email or web address]  [facility]   CC: Primary care physician; Baxter Hire, MD  Note: This dictation was prepared with Dragon dictation along with smaller phrase technology. Any transcriptional errors that result from this process are unintentional.

## 2019-01-07 NOTE — Plan of Care (Signed)
  Problem: Clinical Measurements: Goal: Respiratory complications will improve Outcome: Progressing   

## 2019-01-07 NOTE — Plan of Care (Signed)
  Problem: Education: Goal: Knowledge of risk factors and measures for prevention of condition will improve Outcome: Progressing   Problem: Coping: Goal: Psychosocial and spiritual needs will be supported Outcome: Progressing   Problem: Respiratory: Goal: Will maintain a patent airway Outcome: Progressing Goal: Complications related to the disease process, condition or treatment will be avoided or minimized Outcome: Progressing   

## 2019-01-08 LAB — PROTIME-INR
INR: 3.4 — ABNORMAL HIGH (ref 0.8–1.2)
Prothrombin Time: 34.1 seconds — ABNORMAL HIGH (ref 11.4–15.2)

## 2019-01-08 LAB — COMPREHENSIVE METABOLIC PANEL
ALT: 21 U/L (ref 0–44)
AST: 32 U/L (ref 15–41)
Albumin: 2.8 g/dL — ABNORMAL LOW (ref 3.5–5.0)
Alkaline Phosphatase: 64 U/L (ref 38–126)
Anion gap: 8 (ref 5–15)
BUN: 36 mg/dL — ABNORMAL HIGH (ref 8–23)
CO2: 29 mmol/L (ref 22–32)
Calcium: 8.3 mg/dL — ABNORMAL LOW (ref 8.9–10.3)
Chloride: 100 mmol/L (ref 98–111)
Creatinine, Ser: 0.63 mg/dL (ref 0.61–1.24)
GFR calc Af Amer: >60 mL/min (ref >60)
GFR calc non Af Amer: >60 mL/min (ref >60)
Glucose, Bld: 102 mg/dL — ABNORMAL HIGH (ref 70–99)
Potassium: 4 mmol/L (ref 3.5–5.1)
Sodium: 137 mmol/L (ref 135–145)
Total Bilirubin: 0.8 mg/dL (ref 0.3–1.2)
Total Protein: 6.2 g/dL — ABNORMAL LOW (ref 6.5–8.1)

## 2019-01-08 LAB — FERRITIN: Ferritin: 141 ng/mL (ref 24–336)

## 2019-01-08 LAB — CBC WITH DIFFERENTIAL/PLATELET
Abs Immature Granulocytes: 0.05 10*3/uL (ref 0.00–0.07)
Basophils Absolute: 0 10*3/uL (ref 0.0–0.1)
Basophils Relative: 0 %
Eosinophils Absolute: 0 10*3/uL (ref 0.0–0.5)
Eosinophils Relative: 0 %
HCT: 37.3 % — ABNORMAL LOW (ref 39.0–52.0)
Hemoglobin: 12.5 g/dL — ABNORMAL LOW (ref 13.0–17.0)
Immature Granulocytes: 1 %
Lymphocytes Relative: 16 %
Lymphs Abs: 1.1 10*3/uL (ref 0.7–4.0)
MCH: 29.3 pg (ref 26.0–34.0)
MCHC: 33.5 g/dL (ref 30.0–36.0)
MCV: 87.6 fL (ref 80.0–100.0)
Monocytes Absolute: 0.8 10*3/uL (ref 0.1–1.0)
Monocytes Relative: 11 %
Neutro Abs: 5.1 10*3/uL (ref 1.7–7.7)
Neutrophils Relative %: 72 %
Platelets: 243 10*3/uL (ref 150–400)
RBC: 4.26 MIL/uL (ref 4.22–5.81)
RDW: 13.9 % (ref 11.5–15.5)
WBC: 7 10*3/uL (ref 4.0–10.5)
nRBC: 0 % (ref 0.0–0.2)

## 2019-01-08 LAB — MAGNESIUM: Magnesium: 2 mg/dL (ref 1.7–2.4)

## 2019-01-08 LAB — C-REACTIVE PROTEIN: CRP: 2.6 mg/dL — ABNORMAL HIGH (ref <1.0)

## 2019-01-08 LAB — FIBRIN DERIVATIVES D-DIMER (ARMC ONLY): Fibrin derivatives D-dimer (ARMC): 2352.33 ng/mL (FEU) — ABNORMAL HIGH (ref 0.00–499.00)

## 2019-01-08 MED ORDER — PREDNISONE 10 MG (21) PO TBPK: ORAL_TABLET | ORAL | 0 refills | Status: DC

## 2019-01-08 MED ORDER — ASCORBIC ACID 500 MG PO TABS: 500.0000 mg | ORAL_TABLET | Freq: Every day | ORAL | 0 refills | Status: DC

## 2019-01-08 MED ORDER — ZINC SULFATE 220 (50 ZN) MG PO CAPS: 220.0000 mg | ORAL_CAPSULE | Freq: Every day | ORAL | 0 refills | Status: DC

## 2019-01-08 NOTE — Discharge Instructions (Signed)
COVID-19 COVID-19 is a respiratory infection that is caused by a virus called severe acute respiratory syndrome coronavirus 2 (SARS-CoV-2). The disease is also known as coronavirus disease or novel coronavirus. In some people, the virus may not cause any symptoms. In others, it may cause a serious infection. The infection can get worse quickly and can lead to complications, such as:  Pneumonia, or infection of the lungs.  Acute respiratory distress syndrome or ARDS. This is fluid build-up in the lungs.  Acute respiratory failure. This is a condition in which there is not enough oxygen passing from the lungs to the body.  Sepsis or septic shock. This is a serious bodily reaction to an infection.  Blood clotting problems.  Secondary infections due to bacteria or fungus. The virus that causes COVID-19 is contagious. This means that it can spread from person to person through droplets from coughs and sneezes (respiratory secretions). What are the causes? This illness is caused by a virus. You may catch the virus by:  Breathing in droplets from an infected person's cough or sneeze.  Touching something, like a table or a doorknob, that was exposed to the virus (contaminated) and then touching your mouth, nose, or eyes. What increases the risk? Risk for infection You are more likely to be infected with this virus if you:  Live in or travel to an area with a COVID-19 outbreak.  Come in contact with a sick person who recently traveled to an area with a COVID-19 outbreak.  Provide care for or live with a person who is infected with COVID-19. Risk for serious illness You are more likely to become seriously ill from the virus if you:  Are 65 years of age or older.  Have a long-term disease that lowers your body's ability to fight infection (immunocompromised).  Live in a nursing home or long-term care facility.  Have a long-term (chronic) disease such as: ? Chronic lung disease, including  chronic obstructive pulmonary disease or asthma ? Heart disease. ? Diabetes. ? Chronic kidney disease. ? Liver disease.  Are obese. What are the signs or symptoms? Symptoms of this condition can range from mild to severe. Symptoms may appear any time from 2 to 14 days after being exposed to the virus. They include:  A fever.  A cough.  Difficulty breathing.  Chills.  Muscle pains.  A sore throat.  Loss of taste or smell. Some people may also have stomach problems, such as nausea, vomiting, or diarrhea. Other people may not have any symptoms of COVID-19. How is this diagnosed? This condition may be diagnosed based on:  Your signs and symptoms, especially if: ? You live in an area with a COVID-19 outbreak. ? You recently traveled to or from an area where the virus is common. ? You provide care for or live with a person who was diagnosed with COVID-19.  A physical exam.  Lab tests, which may include: ? A nasal swab to take a sample of fluid from your nose. ? A throat swab to take a sample of fluid from your throat. ? A sample of mucus from your lungs (sputum). ? Blood tests.  Imaging tests, which may include, X-rays, CT scan, or ultrasound. How is this treated? At present, there is no medicine to treat COVID-19. Medicines that treat other diseases are being used on a trial basis to see if they are effective against COVID-19. Your health care provider will talk with you about ways to treat your symptoms. For most   people, the infection is mild and can be managed at home with rest, fluids, and over-the-counter medicines. Treatment for a serious infection usually takes places in a hospital intensive care unit (ICU). It may include one or more of the following treatments. These treatments are given until your symptoms improve.  Receiving fluids and medicines through an IV.  Supplemental oxygen. Extra oxygen is given through a tube in the nose, a face mask, or a  hood.  Positioning you to lie on your stomach (prone position). This makes it easier for oxygen to get into the lungs.  Continuous positive airway pressure (CPAP) or bi-level positive airway pressure (BPAP) machine. This treatment uses mild air pressure to keep the airways open. A tube that is connected to a motor delivers oxygen to the body.  Ventilator. This treatment moves air into and out of the lungs by using a tube that is placed in your windpipe.  Tracheostomy. This is a procedure to create a hole in the neck so that a breathing tube can be inserted.  Extracorporeal membrane oxygenation (ECMO). This procedure gives the lungs a chance to recover by taking over the functions of the heart and lungs. It supplies oxygen to the body and removes carbon dioxide. Follow these instructions at home: Lifestyle  If you are sick, stay home except to get medical care. Your health care provider will tell you how long to stay home. Call your health care provider before you go for medical care.  Rest at home as told by your health care provider.  Do not use any products that contain nicotine or tobacco, such as cigarettes, e-cigarettes, and chewing tobacco. If you need help quitting, ask your health care provider.  Return to your normal activities as told by your health care provider. Ask your health care provider what activities are safe for you. General instructions  Take over-the-counter and prescription medicines only as told by your health care provider.  Drink enough fluid to keep your urine pale yellow.  Keep all follow-up visits as told by your health care provider. This is important. How is this prevented?  There is no vaccine to help prevent COVID-19 infection. However, there are steps you can take to protect yourself and others from this virus. To protect yourself:   Do not travel to areas where COVID-19 is a risk. The areas where COVID-19 is reported change often. To identify  high-risk areas and travel restrictions, check the CDC travel website: wwwnc.cdc.gov/travel/notices  If you live in, or must travel to, an area where COVID-19 is a risk, take precautions to avoid infection. ? Stay away from people who are sick. ? Wash your hands often with soap and water for 20 seconds. If soap and water are not available, use an alcohol-based hand sanitizer. ? Avoid touching your mouth, face, eyes, or nose. ? Avoid going out in public, follow guidance from your state and local health authorities. ? If you must go out in public, wear a cloth face covering or face mask. ? Disinfect objects and surfaces that are frequently touched every day. This may include:  Counters and tables.  Doorknobs and light switches.  Sinks and faucets.  Electronics, such as phones, remote controls, keyboards, computers, and tablets. To protect others: If you have symptoms of COVID-19, take steps to prevent the virus from spreading to others.  If you think you have a COVID-19 infection, contact your health care provider right away. Tell your health care team that you think you may   have a COVID-19 infection.  Stay home. Leave your house only to seek medical care. Do not use public transport.  Do not travel while you are sick.  Wash your hands often with soap and water for 20 seconds. If soap and water are not available, use alcohol-based hand sanitizer.  Stay away from other members of your household. Let healthy household members care for children and pets, if possible. If you have to care for children or pets, wash your hands often and wear a mask. If possible, stay in your own room, separate from others. Use a different bathroom.  Make sure that all people in your household wash their hands well and often.  Cough or sneeze into a tissue or your sleeve or elbow. Do not cough or sneeze into your hand or into the air.  Wear a cloth face covering or face mask. Where to find more  information  Centers for Disease Control and Prevention: PurpleGadgets.be  World Health Organization: https://www.castaneda.info/ Contact a health care provider if:  You live in or have traveled to an area where COVID-19 is a risk and you have symptoms of the infection.  You have had contact with someone who has COVID-19 and you have symptoms of the infection. Get help right away if:  You have trouble breathing.  You have pain or pressure in your chest.  You have confusion.  You have bluish lips and fingernails.  You have difficulty waking from sleep.  You have symptoms that get worse. These symptoms may represent a serious problem that is an emergency. Do not wait to see if the symptoms will go away. Get medical help right away. Call your local emergency services (911 in the [place] Do not drive yourself to the hospital. Let the emergency medical personnel know if you think you have COVID-19. Summary  COVID-19 is a respiratory infection that is caused by a virus. It is also known as coronavirus disease or novel coronavirus. It can cause serious infections, such as pneumonia, acute respiratory distress syndrome, acute respiratory failure, or sepsis.  The virus that causes COVID-19 is contagious. This means that it can spread from person to person through droplets from coughs and sneezes.  You are more likely to develop a serious illness if you are 39 years of age or older, have a weak immunity, live in a nursing home, or have chronic disease.  There is no medicine to treat COVID-19. Your health care provider will talk with you about ways to treat your symptoms.  Take steps to protect yourself and others from infection. Wash your hands often and disinfect objects and surfaces that are frequently touched every day. Stay away from people who are sick and wear a mask if you are sick. This information is not intended to replace advice given to you by  your health care provider. Make sure you discuss any questions you have with your health care provider. Document Released: 02/19/2018 Document Revised: 06/11/2018 Document Reviewed: 02/19/2018 Elsevier Patient Education  2020 [place]: How to Protect Yourself and Others Know how it spreads  There is currently no vaccine to prevent coronavirus disease 2019 (COVID-19).  The best way to prevent illness is to avoid being exposed to this virus.  The virus is thought to spread mainly from person-to-person. ? Between people who are in close contact with one another (within about 6 feet). ? Through respiratory droplets produced when an infected person coughs, sneezes or talks. ? These droplets can land  in the mouths or noses of people who are nearby or possibly be inhaled into the lungs. ? Some recent studies have suggested that COVID-19 may be spread by people who are not showing symptoms. Everyone should Clean your hands often  Wash your hands often with soap and water for at least 20 seconds especially after you have been in a public place, or after blowing your nose, coughing, or sneezing.  If soap and water are not readily available, use a hand sanitizer that contains at least 60% alcohol. Cover all surfaces of your hands and rub them together until they feel dry.  Avoid touching your eyes, nose, and mouth with unwashed hands. Avoid close contact  Stay home if you are sick.  Avoid close contact with people who are sick.  Put distance between yourself and other people. ? Remember that some people without symptoms may be able to spread virus. ? This is especially important for people who are at higher risk of getting very GainPain.com.cy Cover your mouth and nose with a cloth face cover when around others  You could spread COVID-19 to others even if you do not feel sick.  Everyone should wear a  cloth face cover when they have to go out in public, for example to the grocery store or to pick up other necessities. ? Cloth face coverings should not be placed on young children under age 89, anyone who has trouble breathing, or is unconscious, incapacitated or otherwise unable to remove the mask without assistance.  The cloth face cover is meant to protect other people in case you are infected.  Do NOT use a facemask meant for a Dietitian.  Continue to keep about 6 feet between yourself and others. The cloth face cover is not a substitute for social distancing. Cover coughs and sneezes  If you are in a private setting and do not have on your cloth face covering, remember to always cover your mouth and nose with a tissue when you cough or sneeze or use the inside of your elbow.  Throw used tissues in the trash.  Immediately wash your hands with soap and water for at least 20 seconds. If soap and water are not readily available, clean your hands with a hand sanitizer that contains at least 60% alcohol. Clean and disinfect  Clean AND disinfect frequently touched surfaces daily. This includes tables, doorknobs, light switches, countertops, handles, desks, phones, keyboards, toilets, faucets, and sinks. [email or web address]  If surfaces are dirty, clean them: Use detergent or soap and water prior to disinfection.  Then, use a household disinfectant. You can see a list of EPA-registered household disinfectants here. [email or web address] 06/02/2018 This information is not intended to replace advice given to you by your health care provider. Make sure you discuss any questions you have with your health care provider. Document Released: 05/12/2018 Document Revised: 06/10/2018 Document Reviewed: 05/12/2018 Elsevier Patient Education  [place] if [place] If you are sick with  COVID-19 or think you might have COVID-19, follow the steps below to help protect other people in your home and community. Stay home except to get medical care.  Stay home. Most people with COVID-19 have mild illness and are able to recover at home without medical care. Do not leave your home, except to get medical care. Do not visit public areas.  Take care of yourself. Get rest and stay hydrated.  Get medical care when needed. Call your doctor  before you go to their office for care. But, if you have trouble breathing or other concerning symptoms, call 911 for immediate help.  Avoid public transportation, ride-sharing, or taxis. Separate yourself from other people and pets in your home.  As much as possible, stay in a specific room and away from other people and pets in your home. Also, you should use a separate bathroom, if available. If you need to be around other people or animals in or outside of the home, wear a cloth face covering. ? See COVID-19 and Animals if you have questions about pets: https://www.thomas.biz/ Monitor your symptoms.  Common symptoms of COVID-19 include fever and cough. Trouble breathing is a more serious symptom that means you should get medical attention.  Follow care instructions from your healthcare provider and local health department. Your local health authorities will give instructions on checking your symptoms and reporting information. If you develop emergency warning signs for COVID-19 get medical attention immediately.  Emergency warning signs include*:  Trouble breathing  Persistent pain or pressure in the chest  New confusion or not able to be woken  Bluish lips or face *This list is not all inclusive. Please consult your medical provider for any other symptoms that are severe or concerning to you. Call 911 if you have a medical emergency. If you have a medical emergency and need to call 911, notify the  operator that you have or think you might have, COVID-19. If possible, put on a facemask before medical help arrives. Call ahead before visiting your doctor.  Call ahead. Many medical visits for routine care are being postponed or done by phone or telemedicine.  If you have a medical appointment that cannot be postponed, call your doctor's office. This will help the office protect themselves and other patients. If you are sick, wear a cloth covering over your nose and mouth.  You should wear a cloth face covering over your nose and mouth if you must be around other people or animals, including pets (even at home).  You don't need to wear the cloth face covering if you are alone. If you can't put on a cloth face covering (because of trouble breathing for example), cover your coughs and sneezes in some other way. Try to stay at least 6 feet away from other people. This will help protect the people around you. Note: During the COVID-19 pandemic, medical grade facemasks are reserved for healthcare workers and some first responders. You may need to make a cloth face covering using a scarf or bandana. Cover your coughs and sneezes.  Cover your mouth and nose with a tissue when you cough or sneeze.  Throw used tissues in a lined trash can.  Immediately wash your hands with soap and water for at least 20 seconds. If soap and water are not available, clean your hands with an alcohol-based hand sanitizer that contains at least 60% alcohol. Clean your hands often.  Wash your hands often with soap and water for at least 20 seconds. This is especially important after blowing your nose, coughing, or sneezing; going to the bathroom; and before eating or preparing food.  Use hand sanitizer if soap and water are not available. Use an alcohol-based hand sanitizer with at least 60% alcohol, covering all surfaces of your hands and rubbing them together until they feel dry.  Soap and water are the best option,  especially if your hands are visibly dirty.  Avoid touching your eyes, nose, and mouth with unwashed  hands. Avoid sharing personal household items.  Do not share dishes, drinking glasses, cups, eating utensils, towels, or bedding with other people in your home.  Wash these items thoroughly after using them with soap and water or put them in the dishwasher. Clean all "high-touch" surfaces everyday.  Clean and disinfect high-touch surfaces in your "sick room" and bathroom. Let someone else clean and disinfect surfaces in common areas, but not your bedroom and bathroom.  If a caregiver or other person needs to clean and disinfect a sick person's bedroom or bathroom, they should do so on an as-needed basis. The caregiver/other person should wear a mask and wait as long as possible after the sick person has used the bathroom. High-touch surfaces include phones, remote controls, counters, tabletops, doorknobs, bathroom fixtures, toilets, keyboards, tablets, and bedside tables.  Clean and disinfect areas that may have blood, stool, or body fluids on them.  Use household cleaners and disinfectants. Clean the area or item with soap and water or another detergent if it is dirty. Then use a household disinfectant. ? Be sure to follow the instructions on the label to ensure safe and effective use of the product. Many products recommend keeping the surface wet for several minutes to ensure germs are killed. Many also recommend precautions such as wearing gloves and making sure you have good ventilation during use of the product. ? Most EPA-registered household disinfectants should be effective. How to discontinue home isolation  People with COVID-19 who have stayed home (home isolated) can stop home isolation under the following conditions: ? If you will not have a test to determine if you are still contagious, you can leave home after these three things have happened:  You have had no fever for at least  72 hours (that is three full days of no fever without the use of medicine that reduces fevers) AND  other symptoms have improved (for example, when your cough or shortness of breath has improved) AND  at least 10 days have passed since your symptoms first appeared. ? If you will be tested to determine if you are still contagious, you can leave home after these three things have happened:  You no longer have a fever (without the use of medicine that reduces fevers) AND  other symptoms have improved (for example, when your cough or shortness of breath has improved) AND  you received two negative tests in a row, 24 hours apart. Your doctor will follow CDC guidelines. In all cases, follow the guidance of your healthcare provider and local health department. The decision to stop home isolation should be made in consultation with your healthcare provider and state and local health departments. Local decisions depend on local circumstances. [email or web address] 05/31/2018 This information is not intended to replace advice given to you by your health care provider. Make sure you discuss any questions you have with your health care provider. Document Released: 05/12/2018 Document Revised: 06/10/2018 Document Reviewed: 05/12/2018 Elsevier Patient Education  2020 [employer].    COVID-19 Frequently Asked Questions COVID-19 (coronavirus disease) is an infection that is caused by a large family of viruses. Some viruses cause illness in people and others cause illness in animals like camels, cats, and bats. In some cases, the viruses that cause illness in animals can spread to humans. Where did the coronavirus come from? In December 2019, [place] told the [facility]) of several cases of lung disease (human respiratory illness). These cases were linked to an open seafood and livestock  market in the city of [place]. The link to the seafood and livestock market suggests that the virus may  have spread from animals to humans. However, since that first outbreak in December, the virus has also been shown to spread from person to person. What is the name of the disease and the virus? Disease name Early on, this disease was called novel coronavirus. This is because scientists determined that the disease was caused by a new (novel) respiratory virus. The World Health Organization [facility]) has now named the disease COVID-19, or coronavirus disease. Virus name The virus that causes the disease is called severe acute respiratory syndrome coronavirus 2 (SARS-CoV-2). More information on disease and virus naming World Health Organization [facility]): www.who.int/emergencies/diseases/novel-coronavirus-2019/technical-guidance/naming-the-coronavirus-disease-(covid-2019)-and-the-virus-that-causes-it Who is at risk for complications from coronavirus disease? Some people may be at higher risk for complications from coronavirus disease. This includes older adults and people who have chronic diseases, such as heart disease, diabetes, and lung disease. If you are at higher risk for complications, take these extra precautions:  Avoid close contact with people who are sick or have a fever or cough. Stay at least 3-6 ft (1-2 m) away from them, if possible.  Wash your hands often with soap and water for at least 20 seconds.  Avoid touching your face, mouth, nose, or eyes.  Keep supplies on hand at home, such as food, medicine, and cleaning supplies.  Stay home as much as possible.  Avoid social gatherings and travel. How does coronavirus disease spread? The virus that causes coronavirus disease spreads easily from person to person (is contagious). There are also cases of community-spread disease. This means the disease has spread to:  People who have no known contact with other infected people.  People who have not traveled to areas where there are known cases. It appears to spread from one person to  another through droplets from coughing or sneezing. Can I get the virus from touching surfaces or objects? There is still a lot that we do not know about the virus that causes coronavirus disease. Scientists are basing a lot of information on what they know about similar viruses, such as:  Viruses cannot generally survive on surfaces for long. They need a human body (host) to survive.  It is more likely that the virus is spread by close contact with people who are sick (direct contact), such as through: ? Shaking hands or hugging. ? Breathing in respiratory droplets that travel through the air. This can happen when an infected person coughs or sneezes on or near other people.  It is less likely that the virus is spread when a person touches a surface or object that has the virus on it (indirect contact). The virus may be able to enter the body if the person touches a surface or object and then touches his or her face, eyes, nose, or mouth. Can a person spread the virus without having symptoms of the disease? It may be possible for the virus to spread before a person has symptoms of the disease, but this is most likely not the main way the virus is spreading. It is more likely for the virus to spread by being in close contact with people who are sick and breathing in the respiratory droplets of a sick person's cough or sneeze. What are the symptoms of coronavirus disease? Symptoms vary from person to person and can range from mild to severe. Symptoms may include:  Fever.  Cough.  Tiredness, weakness, or fatigue.  Fast  breathing or feeling short of breath. These symptoms can appear anywhere from 2 to 14 days after you have been exposed to the virus. If you develop symptoms, call your health care provider. People with severe symptoms may need hospital care. If I am exposed to the virus, how long does it take before symptoms start? Symptoms of coronavirus disease may appear anywhere from 2 to 14  days after a person has been exposed to the virus. If you develop symptoms, call your health care provider. Should I be tested for this virus? Your health care provider will decide whether to test you based on your symptoms, history of exposure, and your risk factors. How does a health care provider test for this virus? Health care providers will collect samples to send for testing. Samples may include:  Taking a swab of fluid from the nose.  Taking fluid from the lungs by having you cough up mucus (sputum) into a sterile cup.  Taking a blood sample.  Taking a stool or urine sample. Is there a treatment or vaccine for this virus? Currently, there is no vaccine to prevent coronavirus disease. Also, there are no medicines like antibiotics or antivirals to treat the virus. A person who becomes sick is given supportive care, which means rest and fluids. A person may also relieve his or her symptoms by using over-the-counter medicines that treat sneezing, coughing, and runny nose. These are the same medicines that a person takes for the common cold. If you develop symptoms, call your health care provider. People with severe symptoms may need hospital care. What can I do to protect myself and my family from this virus?     You can protect yourself and your family by taking the same actions that you would take to prevent the spread of other viruses. Take the following actions:  Wash your hands often with soap and water for at least 20 seconds. If soap and water are not available, use alcohol-based hand sanitizer.  Avoid touching your face, mouth, nose, or eyes.  Cough or sneeze into a tissue, sleeve, or elbow. Do not cough or sneeze into your hand or the air. ? If you cough or sneeze into a tissue, throw it away immediately and wash your hands.  Disinfect objects and surfaces that you frequently touch every day.  Avoid close contact with people who are sick or have a fever or cough. Stay at  least 3-6 ft (1-2 m) away from them, if possible.  Stay home if you are sick, except to get medical care. Call your health care provider before you get medical care.  Make sure your vaccines are up to date. Ask your health care provider what vaccines you need. What should I do if I need to travel? Follow travel recommendations from your local health authority, the CDC, and WHO. Travel information and advice  Centers for Disease Control and Prevention (CDC): BodyEditor.hu  World Health Organization [facility]): ThirdIncome.ca Know the risks and take action to protect your health  You are at higher risk of getting coronavirus disease if you are traveling to areas with an outbreak or if you are exposed to travelers from areas with an outbreak.  Wash your hands often and practice good hygiene to lower the risk of catching or spreading the virus. What should I do if I am sick? General instructions to stop the spread of infection  Wash your hands often with soap and water for at least 20 seconds. If soap and water  are not available, use alcohol-based hand sanitizer.  Cough or sneeze into a tissue, sleeve, or elbow. Do not cough or sneeze into your hand or the air.  If you cough or sneeze into a tissue, throw it away immediately and wash your hands.  Stay home unless you must get medical care. Call your health care provider or local health authority before you get medical care.  Avoid public areas. Do not take public transportation, if possible.  If you can, wear a mask if you must go out of the house or if you are in close contact with someone who is not sick. Keep your home clean  Disinfect objects and surfaces that are frequently touched every day. This may include: ? Counters and tables. ? Doorknobs and light switches. ? Sinks and faucets. ? Electronics such as phones, remote controls,  keyboards, computers, and tablets.  Wash dishes in hot, soapy water or use a dishwasher. Air-dry your dishes.  Wash laundry in hot water. Prevent infecting other household members  Let healthy household members care for children and pets, if possible. If you have to care for children or pets, wash your hands often and wear a mask.  Sleep in a different bedroom or bed, if possible.  Do not share personal items, such as razors, toothbrushes, deodorant, combs, brushes, towels, and washcloths. Where to find more information Centers for Disease Control and Prevention (CDC)  Information and news updates: https://www.butler-gonzalez.com/ World Health Organization [facility])  Information and news updates: MissExecutive.com.ee  Coronavirus health topic: https://www.castaneda.info/  Questions and answers on COVID-19: OpportunityDebt.at  Global tracker: who.sprinklr.com American Academy of Pediatrics (AAP)  Information for families: www.healthychildren.org/English/health-issues/conditions/chest-lungs/Pages/2019-Novel-Coronavirus.aspx The coronavirus situation is changing rapidly. Check your local health authority website or the CDC and [facility] websites for updates and news. When should I contact a health care provider?  Contact your health care provider if you have symptoms of an infection, such as fever or cough, and you: ? Have been near anyone who is known to have coronavirus disease. ? Have come into contact with a person who is suspected to have coronavirus disease. ? Have traveled outside of the country. When should I get emergency medical care?  Get help right away by calling your local emergency services (911 in the [place] if you have: ? Trouble breathing. ? Pain or pressure in your chest. ? Confusion. ? Blue-tinged lips and fingernails. ? Difficulty waking from sleep. ? Symptoms that get worse. Let the  emergency medical personnel know if you think you have coronavirus disease. Summary  A new respiratory virus is spreading from person to person and causing COVID-19 (coronavirus disease).  The virus that causes COVID-19 appears to spread easily. It spreads from one person to another through droplets from coughing or sneezing.  Older adults and those with chronic diseases are at higher risk of disease. If you are at higher risk for complications, take extra precautions.  There is currently no vaccine to prevent coronavirus disease. There are no medicines, such as antibiotics or antivirals, to treat the virus.  You can protect yourself and your family by washing your hands often, avoiding touching your face, and covering your coughs and sneezes. This information is not intended to replace advice given to you by your health care provider. Make sure you discuss any questions you have with your health care provider. Document Released: 05/12/2018 Document Revised: 05/12/2018 Document Reviewed: 05/12/2018 Elsevier Patient Education  [place].

## 2019-01-08 NOTE — Discharge Summary (Signed)
[name] NAME: [name]    MR#:  [id number]  DATE OF BIRTH:  [birth year]  DATE OF ADMISSION:  01/04/2019   ADMITTING PHYSICIAN: Ivor Costa, MD  DATE OF DISCHARGE: 01/08/2019  1:01 PM  PRIMARY CARE PHYSICIAN: Baxter Hire, MD   ADMISSION DIAGNOSIS:  Hypoxia [R09.02] COVID-19 [U07.1] DISCHARGE DIAGNOSIS:  Principal Problem:   COVID-19 Active Problems:   Atrial fibrillation with RVR (HCC)   Hypertension   Coronary artery disease   Elevated troponin   Constipation   HLD (hyperlipidemia)  SECONDARY DIAGNOSIS:   Past Medical History:  Diagnosis Date  . Coronary artery disease   . Hypertension    HOSPITAL COURSE:  [name] is a 85 y.o. male with medical history significant of hypertension, atrial fibrillation on Coumadin, CAD, admitted with progressive worsening shortness of breath and constipation. He has dry cough. He has been constipated which was causing some abdominal pain.  ED Course: pt was found to have COVID-19 Ag positive, WBC 6.5, troponin 50, 64, electrolytes renal function okay, temperature normal, blood pressure 176/86, tachycardia, tachypnea, oxygen saturation 88% on room air which improved to 95% on 2 L nasal cannula oxygen. Chest x-ray with patchy left lung infiltration.   Acute hypoxic respiratory failure due to COVID-19 virus:on 1L oxygen -Completed course of remdesivir, also received IV steroids while in the hospital.  Going home on steroid taper -vitamin C, zinc at discharge. -Echo wnl (this was obtained as patient's BNP was elevated and had symptoms of dyspnea on exertion with no recent echo)  Atrial fibrillation with RVR ([place]):Hr well controlled on metoprolol and Cardizem -Have requested to hold his Coumadin for next 2 days as INR was still 3.4 in spite of holding Coumadin for last 3 days in the hospital.  I have requested patient/his daughter to get INR rechecked with his PCP office  next week to decide Coumadin dosing  HTN:  -Continue metoprolol and cardizem  Hx of coronary artery diseaseand elevated troponin: trop minimally elevated. Due to demand ischemia. No CP.  -ASA and metoprolol - lipitor -A1c 6.5   HLD: -On Lipitor  Constipation: Present on admission and resolved at discharge -Given miralax and senokot while in the hospital  Hypokalemia Repleted and resolved DISCHARGE CONDITIONS:  Stable CONSULTS OBTAINED:   DRUG ALLERGIES:  No Known Allergies DISCHARGE MEDICATIONS:   Allergies as of 01/08/2019   No Known Allergies     Medication List    STOP taking these medications   warfarin 2 MG tablet Commonly known as: COUMADIN   warfarin 3 MG tablet Commonly known as: COUMADIN     TAKE these medications   ascorbic acid 500 MG tablet Commonly known as: VITAMIN C Take 1 tablet (500 mg total) by mouth daily. Start taking on: January 09, 2019 Notes to patient: Tomorrow at 9A.    atorvastatin 20 MG tablet Commonly known as: LIPITOR Take 20 mg by mouth daily. Notes to patient: Tomorrow at 9A.    diltiazem 240 MG 24 hr capsule Commonly known as: CARDIZEM CD Take 240 mg by mouth daily. Notes to patient: Tomorrow at 9A.    metoprolol tartrate 50 MG tablet Commonly known as: LOPRESSOR Take 50 mg by mouth 2 (two) times daily. Notes to patient: Tonight at 9P.    predniSONE 10 MG (21) Tbpk tablet Commonly known as: STERAPRED UNI-PAK 21 TAB Start 60 mg p.o. daily, taper 10 mg daily until  finished Notes to patient: Today when home.    traZODone 50 MG tablet Commonly known as: DESYREL Take 50 mg by mouth at bedtime. Notes to patient: Tonight at bedtime.    zinc sulfate 220 (50 Zn) MG capsule Take 1 capsule (220 mg total) by mouth daily. Start taking on: January 09, 2019 Notes to patient: Tomorrow at [place].       DISCHARGE INSTRUCTIONS:   DIET:  Cardiac diet DISCHARGE CONDITION:  Stable ACTIVITY:  Activity as  tolerated OXYGEN:  Home Oxygen: No.  Oxygen Delivery: room air DISCHARGE LOCATION:  home   If you experience worsening of your admission symptoms, develop shortness of breath, life threatening emergency, suicidal or homicidal thoughts you must seek medical attention immediately by calling 911 or calling your MD immediately  if symptoms less severe.  You Must read complete instructions/literature along with all the possible adverse reactions/side effects for all the Medicines you take and that have been prescribed to you. Take any new Medicines after you have completely understood and accpet all the possible adverse reactions/side effects.   Please note  You were cared for by a hospitalist during your hospital stay. If you have any questions about your discharge medications or the care you received while you were in the hospital after you are discharged, you can call the unit and asked to speak with the hospitalist on call if the hospitalist that took care of you is not available. Once you are discharged, your primary care physician will handle any further medical issues. Please note that NO REFILLS for any discharge medications will be authorized once you are discharged, as it is imperative that you return to your primary care physician (or establish a relationship with a primary care physician if you do not have one) for your aftercare needs so that they can reassess your need for medications and monitor your lab values.    On the day of Discharge:  VITAL SIGNS:  Blood pressure (!) 161/82, pulse 98, temperature 97.7 F (36.5 C), temperature source Oral, resp. rate 19, height 5\' 9"  (1.753 m), weight 56.3 kg, SpO2 90 %. PHYSICAL EXAMINATION:  GENERAL:  85 y.o.-year-old patient lying in the bed with no acute distress.  EYES: Pupils equal, round, reactive to light and accommodation. No scleral icterus. Extraocular muscles intact.  HEENT: Head atraumatic, normocephalic. Oropharynx and nasopharynx  clear.  NECK:  Supple, no jugular venous distention. No thyroid enlargement, no tenderness.  LUNGS: Normal breath sounds bilaterally, no wheezing, rales,rhonchi or crepitation. No use of accessory muscles of respiration.  CARDIOVASCULAR: S1, S2 normal. No murmurs, rubs, or gallops.  ABDOMEN: Soft, non-tender, non-distended. Bowel sounds present. No organomegaly or mass.  EXTREMITIES: No pedal edema, cyanosis, or clubbing.  NEUROLOGIC: Cranial nerves II through XII are intact. Muscle strength 5/5 in all extremities. Sensation intact. Gait not checked.  PSYCHIATRIC: The patient is alert and oriented x 3.  SKIN: No obvious rash, lesion, or ulcer.  DATA REVIEW:   CBC Recent Labs  Lab 01/08/19 0640  WBC 7.0  HGB 12.5*  HCT 37.3*  PLT 243    Chemistries  Recent Labs  Lab 01/08/19 0640  NA 137  K 4.0  CL 100  CO2 29  GLUCOSE 102*  BUN 36*  CREATININE 0.63  CALCIUM 8.3*  MG 2.0  AST 32  ALT 21  ALKPHOS 64  BILITOT 0.8     Follow-up Information    Baxter Hire, MD. Go on 01/12/2019.   Specialty: Internal Medicine  Why: Appointment Time:8:30am Contact information: [place] [address] [phone]            Management plans discussed with the patient, family (discussed with daughter over phone) and they are in agreement.  CODE STATUS: Full Code   TOTAL TIME TAKING CARE OF THIS PATIENT: 45 minutes.    Max Sane M.D on 01/08/2019 at 1:28 PM  Between 7am to 6pm - Pager - [phone]  After 6pm go to [email or web address] - password [email or web address]  [facility]   CC: Primary care physician; Baxter Hire, MD   Note: This dictation was prepared with Dragon dictation along with smaller phrase technology. Any transcriptional errors that result from this process are unintentional.

## 2019-01-08 NOTE — Care Management Important Message (Signed)
Important Message  Patient Details  Name: PATTERSON PLOTT MRN: [id number] Date of Birth: 1928-10-16   Medicare Important Message Given:  No  Patient discharged prior to attempt at reviewing verbally over room phone.  Dannette Barbara 01/08/2019, 2:44 PM

## 2019-01-08 NOTE — Progress Notes (Signed)
ANTICOAGULATION CONSULT NOTE  Pharmacy Consult for warfarin Indication: atrial fibrillation  No Known Allergies   Vital Signs: Temp: 97.7 F (36.5 C) (12/11 0814) Temp Source: Oral (12/11 0814) BP: 161/82 (12/11 0814) Pulse Rate: 98 (12/11 0814)  Labs: Recent Labs    01/06/19 0544 01/07/19 0716 01/08/19 0640  HGB 12.0* 12.3* 12.5*  HCT 35.3* 37.3* 37.3*  PLT 183 191 243  LABPROT 32.3* 35.4* 34.1*  INR 3.2* 3.5* 3.4*  CREATININE 0.74 0.67 0.63    Estimated Creatinine Clearance: 50.8 mL/min (by C-G formula based on SCr of 0.63 mg/dL).   Medical History: Past Medical History:  Diagnosis Date  . Coronary artery disease   . Hypertension     Assessment: 85 year old male with h/o afib on warfarin PTA. Patient also COVID+ being treated with remdesivir. Appears patient is taking warfarin 5 mg daily PTA. Based [facility] in [facility], patient therapeutic on this regimen for the past several months. No major DDI. Pharmacy consulted to continue warfarin.  Date INR Dose 12/7 1.7 7.5 mg 12/8 2.1       6 mg 12/9     3.2       HOLD 12/10   3.5       HOLD 12/11   3.4       HOLD   Goal of Therapy:  INR 2-3 Monitor platelets by anticoagulation protocol: Yes   Plan:  INR supratherapeutic and trending up.Will hold warfarin today and anticipate resume home dose tomorrow. Daily INR order. Check CBC at least every 3 days.   If discharging today: recommend hold warfarin dose for one more day and resume home dose of 5 mg and follow up with clinic/INR check within 3 days.   Oswald Hillock, PharmD, BCPS 01/08/2019,8:51 AM

## 2019-01-08 NOTE — TOC Transition Note (Signed)
Transition of Care [facility]) - CM/SW Discharge Note   Patient Details  Name: [name] MRN: [id number] Date of Birth: Jul 09, 1928  Transition of Care [facility] CM/SW Contact:  Candie Chroman, LCSW Phone Number: 01/08/2019, 9:07 AM   Clinical Narrative:  Patient has orders to discharge home today. He did not have any home health services prior to admission. He has a cane and walker at home to use if needed. He declined outpatient palliative services at this time. [name] daughter will pick him up today. No further concerns. CSW signing off.   Final next level of care: Home/Self Care Barriers to Discharge: Barriers Resolved   Patient Goals and CMS Choice        Discharge Placement                       Discharge Plan and Services                                     Social Determinants of Health (SDOH) Interventions     Readmission Risk Interventions No flowsheet data found.

## 2019-01-09 LAB — CULTURE, BLOOD (ROUTINE X 2)
Culture: NO GROWTH
Culture: NO GROWTH
Special Requests: ADEQUATE
Special Requests: ADEQUATE

## 2019-01-13 ENCOUNTER — Telehealth: Payer: Self-pay | Admitting: Nurse Practitioner

## 2019-01-13 NOTE — Telephone Encounter (Signed)
Called paient's daughter [name] to schedule a Palliative Consult, no answer - left message with reason for call along with my contact information.

## 2019-01-14 ENCOUNTER — Telehealth: Payer: Self-pay | Admitting: Nurse Practitioner

## 2019-01-14 NOTE — Telephone Encounter (Signed)
Rec'd message that daughter returned my call on 01/13/19 @ 7:39 PM.  Called daughter back and after discussing Palliative services with her and all questions were answered she was in agreement with Palliative services.  I have scheduled a Telehealth Zoom Consult for 02/02/19 @ 4 PM.

## 2019-01-18 ENCOUNTER — Other Ambulatory Visit: Payer: Self-pay | Admitting: Internal Medicine

## 2019-01-18 DIAGNOSIS — U071 COVID-19: Secondary | ICD-10-CM

## 2019-01-24 ENCOUNTER — Other Ambulatory Visit: Payer: Self-pay

## 2019-01-24 ENCOUNTER — Inpatient Hospital Stay
Admission: EM | Admit: 2019-01-24 | Discharge: 2019-01-31 | DRG: 190 | Disposition: A | Discharge status: Home Health | Payer: Medicare Other | Attending: Family Medicine | Admitting: Family Medicine

## 2019-01-24 ENCOUNTER — Emergency Department: Payer: Medicare Other

## 2019-01-24 DIAGNOSIS — I482 Chronic atrial fibrillation, unspecified: Secondary | ICD-10-CM | POA: Diagnosis not present

## 2019-01-24 DIAGNOSIS — I10 Essential (primary) hypertension: Secondary | ICD-10-CM | POA: Diagnosis present

## 2019-01-24 DIAGNOSIS — I110 Hypertensive heart disease with heart failure: Secondary | ICD-10-CM | POA: Diagnosis present

## 2019-01-24 DIAGNOSIS — Z7901 Long term (current) use of anticoagulants: Secondary | ICD-10-CM

## 2019-01-24 DIAGNOSIS — I5033 Acute on chronic diastolic (congestive) heart failure: Secondary | ICD-10-CM | POA: Diagnosis present

## 2019-01-24 DIAGNOSIS — U071 COVID-19: Secondary | ICD-10-CM | POA: Diagnosis not present

## 2019-01-24 DIAGNOSIS — E876 Hypokalemia: Secondary | ICD-10-CM | POA: Diagnosis present

## 2019-01-24 DIAGNOSIS — I480 Paroxysmal atrial fibrillation: Secondary | ICD-10-CM | POA: Diagnosis present

## 2019-01-24 DIAGNOSIS — J1289 Other viral pneumonia: Secondary | ICD-10-CM | POA: Diagnosis not present

## 2019-01-24 DIAGNOSIS — R791 Abnormal coagulation profile: Secondary | ICD-10-CM | POA: Diagnosis present

## 2019-01-24 DIAGNOSIS — Z79899 Other long term (current) drug therapy: Secondary | ICD-10-CM

## 2019-01-24 DIAGNOSIS — J9601 Acute respiratory failure with hypoxia: Secondary | ICD-10-CM | POA: Diagnosis present

## 2019-01-24 DIAGNOSIS — J1282 Pneumonia due to coronavirus disease 2019: Secondary | ICD-10-CM

## 2019-01-24 DIAGNOSIS — Z87891 Personal history of nicotine dependence: Secondary | ICD-10-CM | POA: Diagnosis not present

## 2019-01-24 DIAGNOSIS — Z66 Do not resuscitate: Secondary | ICD-10-CM | POA: Diagnosis present

## 2019-01-24 DIAGNOSIS — R0902 Hypoxemia: Secondary | ICD-10-CM

## 2019-01-24 DIAGNOSIS — D649 Anemia, unspecified: Secondary | ICD-10-CM | POA: Diagnosis present

## 2019-01-24 DIAGNOSIS — Z8616 Personal history of COVID-19: Secondary | ICD-10-CM | POA: Diagnosis present

## 2019-01-24 DIAGNOSIS — I2510 Atherosclerotic heart disease of native coronary artery without angina pectoris: Secondary | ICD-10-CM | POA: Diagnosis present

## 2019-01-24 DIAGNOSIS — J441 Chronic obstructive pulmonary disease with (acute) exacerbation: Principal | ICD-10-CM | POA: Diagnosis present

## 2019-01-24 LAB — BASIC METABOLIC PANEL
Anion gap: 10 (ref 5–15)
BUN: 23 mg/dL (ref 8–23)
CO2: 27 mmol/L (ref 22–32)
Calcium: 8.1 mg/dL — ABNORMAL LOW (ref 8.9–10.3)
Chloride: 103 mmol/L (ref 98–111)
Creatinine, Ser: 0.79 mg/dL (ref 0.61–1.24)
GFR calc Af Amer: >60 mL/min (ref >60)
GFR calc non Af Amer: >60 mL/min (ref >60)
Glucose, Bld: 110 mg/dL — ABNORMAL HIGH (ref 70–99)
Potassium: 3.7 mmol/L (ref 3.5–5.1)
Sodium: 140 mmol/L (ref 135–145)

## 2019-01-24 LAB — CBC WITH DIFFERENTIAL/PLATELET
Abs Immature Granulocytes: 0.04 10*3/uL (ref 0.00–0.07)
Basophils Absolute: 0 10*3/uL (ref 0.0–0.1)
Basophils Relative: 1 %
Eosinophils Absolute: 0.1 10*3/uL (ref 0.0–0.5)
Eosinophils Relative: 1 %
HCT: 31.8 % — ABNORMAL LOW (ref 39.0–52.0)
Hemoglobin: 10.2 g/dL — ABNORMAL LOW (ref 13.0–17.0)
Immature Granulocytes: 1 %
Lymphocytes Relative: 15 %
Lymphs Abs: 1.3 10*3/uL (ref 0.7–4.0)
MCH: 29.2 pg (ref 26.0–34.0)
MCHC: 32.1 g/dL (ref 30.0–36.0)
MCV: 91.1 fL (ref 80.0–100.0)
Monocytes Absolute: 0.9 10*3/uL (ref 0.1–1.0)
Monocytes Relative: 10 %
Neutro Abs: 6.3 10*3/uL (ref 1.7–7.7)
Neutrophils Relative %: 72 %
Platelets: 209 10*3/uL (ref 150–400)
RBC: 3.49 MIL/uL — ABNORMAL LOW (ref 4.22–5.81)
RDW: 14.8 % (ref 11.5–15.5)
WBC: 8.7 10*3/uL (ref 4.0–10.5)
nRBC: 0 % (ref 0.0–0.2)

## 2019-01-24 LAB — FIBRIN DERIVATIVES D-DIMER (ARMC ONLY): Fibrin derivatives D-dimer (ARMC): 1836.65 ng/mL (FEU) — ABNORMAL HIGH (ref 0.00–499.00)

## 2019-01-24 LAB — PROTIME-INR
INR: 3.4 — ABNORMAL HIGH (ref 0.8–1.2)
Prothrombin Time: 34 seconds — ABNORMAL HIGH (ref 11.4–15.2)

## 2019-01-24 LAB — PROCALCITONIN: Procalcitonin: <0.1 ng/mL

## 2019-01-24 LAB — TROPONIN I (HIGH SENSITIVITY): Troponin I (High Sensitivity): 9 ng/L (ref <18)

## 2019-01-24 MED ORDER — GUAIFENESIN-DM 100-10 MG/5ML PO SYRP
10.0000 mL | ORAL_SOLUTION | ORAL | Status: DC | PRN
  Filled 2019-01-24: qty 10

## 2019-01-24 MED ORDER — ADULT MULTIVITAMIN W/MINERALS CH
1.0000 | ORAL_TABLET | Freq: Every day | ORAL | Status: DC
  Administered 2019-01-24 – 2019-01-31 (×8): 1 via ORAL
  Filled 2019-01-24 (×10): qty 1

## 2019-01-24 MED ORDER — SODIUM CHLORIDE 0.9 % IV SOLN: 200.0000 mg | Freq: Once | INTRAVENOUS | Status: DC

## 2019-01-24 MED ORDER — SODIUM CHLORIDE 0.9 % IV SOLN
2.0000 g | INTRAVENOUS | Status: AC
  Administered 2019-01-24 – 2019-01-26 (×3): 2 g via INTRAVENOUS
  Filled 2019-01-24 (×3): qty 20

## 2019-01-24 MED ORDER — METHYLPREDNISOLONE SODIUM SUCC 40 MG IJ SOLR
0.2500 mg/kg | Freq: Two times a day (BID) | INTRAMUSCULAR | Status: DC
  Administered 2019-01-24 – 2019-01-26 (×5): 14.4 mg via INTRAVENOUS
  Filled 2019-01-24 (×5): qty 1

## 2019-01-24 MED ORDER — DEXAMETHASONE SODIUM PHOSPHATE 10 MG/ML IJ SOLN
6.0000 mg | Freq: Once | INTRAMUSCULAR | Status: AC
  Administered 2019-01-24: 6 mg via INTRAVENOUS
  Filled 2019-01-24: qty 1

## 2019-01-24 MED ORDER — ASCORBIC ACID 500 MG PO TABS
500.0000 mg | ORAL_TABLET | Freq: Every day | ORAL | Status: DC
  Administered 2019-01-25 – 2019-01-27 (×3): 500 mg via ORAL
  Filled 2019-01-24 (×3): qty 1

## 2019-01-24 MED ORDER — SODIUM CHLORIDE 0.9 % IV SOLN
500.0000 mg | INTRAVENOUS | Status: AC
  Administered 2019-01-25 – 2019-01-26 (×3): 500 mg via INTRAVENOUS
  Filled 2019-01-24 (×4): qty 500

## 2019-01-24 MED ORDER — ZINC SULFATE 220 (50 ZN) MG PO CAPS
220.0000 mg | ORAL_CAPSULE | Freq: Every day | ORAL | Status: DC
  Administered 2019-01-25 – 2019-01-27 (×3): 220 mg via ORAL
  Filled 2019-01-24 (×3): qty 1

## 2019-01-24 MED ORDER — ALBUTEROL SULFATE HFA 108 (90 BASE) MCG/ACT IN AERS
2.0000 | INHALATION_SPRAY | Freq: Four times a day (QID) | RESPIRATORY_TRACT | Status: DC
  Administered 2019-01-24 – 2019-01-28 (×13): 2 via RESPIRATORY_TRACT
  Filled 2019-01-24 (×2): qty 6.7

## 2019-01-24 MED ORDER — SODIUM CHLORIDE 0.9 % IV SOLN: 100.0000 mg | Freq: Every day | INTRAVENOUS | Status: DC

## 2019-01-24 NOTE — H&P (Signed)
History and Physical    [name] [id number] DOB: 24-May-1928 DOA: 01/24/2019  PCP: Baxter Hire, MD  Patient coming from: home. Daughter stays with him  I have personally briefly reviewed patient's old medical records in [place]  Chief Complaint: shortness of breath  HPI: [name] is a 85 y.o. male with medical history significant for hypertension, atrial fibrillation on Coumadin, CAD, hospitalized from 01/04/2019 to 01/08/2019 with COVID-19 pneumonia returns to the emergency room with worsening shortness of breath and cough.  He denies fever or chest pain.  Denies nausea vomiting and diarrhea or abdominal pain.  During his recent hospitalization he was treated with remdesivir and steroids.  Patient had an elevated BNP on last hospitalized and had an echocardiogram that was within normal limits ED Course: On arrival to the emergency room he was afebrile with a temperature of 98.1 blood pressure 145/80 he was tachycardic at 102 and tachypneic at 22 with oxygen saturation of 79% on room air.  On his blood work he had an elevated fibrin derivatives at 1836 but this was down from 2000 352 at his discharge 2 weeks prior.  Procalcitonin was less than 0.1.  INR 3.4.  Troponin was 9 chest x-ray showed multifocal pneumonia versus edema.  Patient required 4 L O2 via nasal cannula to improve sats to the mid 90s  Review of Systems: As per HPI otherwise 10 point review of systems negative.   Past Medical History:  Diagnosis Date  . Coronary artery disease   . Hypertension     Past Surgical History:  Procedure Laterality Date  . Renal Bypass Surgery       reports that he has quit smoking. He has never used smokeless tobacco. He reports that he does not drink alcohol or use drugs.  No Known Allergies  No family history on file.   Prior to Admission medications   Medication Sig Start Date End Date Taking? Authorizing Provider  atorvastatin (LIPITOR) 20 MG tablet  Take 20 mg by mouth daily.    [provider]  diltiazem (CARDIZEM CD) 240 MG 24 hr capsule Take 240 mg by mouth daily.    [provider]  metoprolol tartrate (LOPRESSOR) 50 MG tablet Take 50 mg by mouth 2 (two) times daily.    [provider]  predniSONE (STERAPRED UNI-PAK 21 TAB) 10 MG (21) TBPK tablet Start 60 mg p.o. daily, taper 10 mg daily until finished 01/08/19   Max Sane, MD  traZODone (DESYREL) 50 MG tablet Take 50 mg by mouth at bedtime. 11/18/18 11/18/19  [provider]  vitamin C (VITAMIN C) 500 MG tablet Take 1 tablet (500 mg total) by mouth daily. 01/09/19   Max Sane, MD  zinc sulfate 220 (50 Zn) MG capsule Take 1 capsule (220 mg total) by mouth daily. 01/09/19   Max Sane, MD    Physical Exam: Vitals:   01/24/19 1757 01/24/19 1803 01/24/19 1817 01/24/19 1830  BP: (!) 145/80  (!) 157/75 (!) 158/88  Pulse: (!) 102  94 94  Resp: (!) 22  20 20   Temp: 98.1 F (36.7 C)     TempSrc: Oral     SpO2: (!) 79%  91% 97%  Weight:  58.1 kg    Height:  5\' 10"  (1.778 m)       Vitals:   01/24/19 1757 01/24/19 1803 01/24/19 1817 01/24/19 1830  BP: (!) 145/80  (!) 157/75 (!) 158/88  Pulse: (!) 102  94 94  Resp: (!) 22  20 20   Temp: 98.1 F (36.7 C)     TempSrc: Oral     SpO2: (!) 79%  91% 97%  Weight:  58.1 kg    Height:  5\' 10"  (1.778 m)      Constitutional: NAD, alert and oriented x 3 Eyes: PERRL, lids and conjunctivae normal ENMT: Mucous membranes are moist. No pharyngeal injection Neck: normal, supple, no masses, no thyromegaly Respiratory: increased respiratory effort, wheezes and crackles bilateral lung fields.  Cardiovascular: Regular rate and rhythm, no murmurs / rubs / gallops. No extremity edema. 2+ pedal pulses. No carotid bruits.  Abdomen: no tenderness, no masses palpated. No hepatosplenomegaly. Bowel sounds positive.  Musculoskeletal: no clubbing / cyanosis. No joint deformity upper and lower extremities.  Skin: no  rashes, lesions, ulcers.  Neurologic: No gross focal neurologic deficit. Psychiatric: Normal mood and affect.   Labs on Admission: I have personally reviewed following labs and imaging studies  CBC: Recent Labs  Lab 01/24/19 2027  WBC 8.7  NEUTROABS 6.3  HGB 10.2*  HCT 31.8*  MCV 91.1  PLT [id number]   Basic Metabolic Panel: Recent Labs  Lab 01/24/19 2027  NA 140  K 3.7  CL 103  CO2 27  GLUCOSE 110*  BUN 23  CREATININE 0.79  CALCIUM 8.1*   GFR: Estimated Creatinine Clearance: 52.5 mL/min (by C-G formula based on SCr of 0.79 mg/dL). Liver Function Tests: No results for input(s): AST, ALT, ALKPHOS, BILITOT, PROT, ALBUMIN in the last 168 hours. No results for input(s): LIPASE, AMYLASE in the last 168 hours. No results for input(s): AMMONIA in the last 168 hours. Coagulation Profile: Recent Labs  Lab 01/24/19 2027  INR 3.4*   Cardiac Enzymes: No results for input(s): CKTOTAL, CKMB, CKMBINDEX, TROPONINI in the last 168 hours. BNP (last 3 results) No results for input(s): PROBNP in the last 8760 hours. HbA1C: No results for input(s): HGBA1C in the last 72 hours. CBG: No results for input(s): GLUCAP in the last 168 hours. Lipid Profile: No results for input(s): CHOL, HDL, LDLCALC, TRIG, CHOLHDL, LDLDIRECT in the last 72 hours. Thyroid Function Tests: No results for input(s): TSH, T4TOTAL, FREET4, T3FREE, THYROIDAB in the last 72 hours. Anemia Panel: No results for input(s): VITAMINB12, FOLATE, FERRITIN, TIBC, IRON, RETICCTPCT in the last 72 hours. Urine analysis:    Component Value Date/Time   COLORURINE YELLOW (A) 01/04/2019 1206   APPEARANCEUR CLEAR (A) 01/04/2019 1206   LABSPEC 1.018 01/04/2019 1206   PHURINE 6.0 01/04/2019 1206   GLUCOSEU NEGATIVE 01/04/2019 1206   HGBUR NEGATIVE 01/04/2019 1206   BILIRUBINUR NEGATIVE 01/04/2019 1206   KETONESUR 5 (A) 01/04/2019 1206   PROTEINUR 100 (A) 01/04/2019 1206   NITRITE NEGATIVE 01/04/2019 1206   LEUKOCYTESUR  NEGATIVE 01/04/2019 1206    Radiological Exams on Admission: DG Chest Portable 1 View  Result Date: 01/24/2019 CLINICAL DATA:  Shortness of breath, diagnosed with COVID-19 approximately 2 weeks ago, treated and released from hospital, now very short of breath, hypoxic, tripod Ng in triage with retractions, history coronary artery disease, atrial fibrillation, hypertension, former smoker EXAM: PORTABLE CHEST 1 VIEW COMPARISON:  Portable exam 1818 hours compared to 01/04/2019 FINDINGS: Rotated to the LEFT. Normal heart size and mediastinal contours. Atherosclerotic calcification aorta. Emphysematous changes with significantly increased BILATERAL pulmonary infiltrates greatest at RIGHT base. Bibasilar effusions. Findings could represent multifocal pneumonia or pulmonary edema. No pneumothorax. Bones demineralized. IMPRESSION: Significantly increased BILATERAL pulmonary infiltrates question multifocal pneumonia or pulmonary edema. BILATERAL pleural effusions and  underlying COPD changes. Aortic Atherosclerosis (ICD10-I70.0). Electronically Signed   By: Lavonia Dana M.D.   On: 01/24/2019 18:57    EKG: Independently reviewed.   Assessment/Plan Principal Problem:   Acute respiratory failure with hypoxia (HCC) related to viral pneumonia/COVID-19 --Patient was recently hospitalized with COVID-19 pneumonia but has persistent symptoms and worsening shortness of breath. --Supplemental oxygen to keep sats over 90% --Repeat remdesivir not indicated at this time --Dexamethasone IV and vitamins --Procalcitonin was less than 0.10 but patient will be treated with empiric antibiotic therapy --Rocephin and azithromycin IV  Supratherapeutic INR --Hold Coumadin for now --Daily PT/INR    Atrial fibrillation, chronic (HCC) --Rate controlled on metoprolol and diltiazem --In Coumadin due to INR of 3.4 to resume when appropriate  Essential hypertension --Continue metoprolol and diltiazem    Coronary artery  disease --No complaints of chest pain.  EKG showed A. fib but no acute ST-T wave changes, troponin negative --Continue statin, aspirin and beta-blocker      DVT prophylaxis:on coumadin with INR of 3.4  Code Status: DNR  Family Communication: Daughter Nicasio Yarger [phone] who confirms fathers wishes for DNR  Disposition Plan: Back to previous home environment Consults called: none     Athena Masse MD [facility]     01/24/2019, 10:06 PM

## 2019-01-24 NOTE — ED Provider Notes (Signed)
[facility] Emergency Department Provider Note  ____________________________________________   I have reviewed the triage vital signs and the nursing notes.   HISTORY  Chief Complaint Shortness of Breath   History limited by: Not Limited   HPI [name] is a 85 y.o. male who presents to the emergency department today because of concerns for worsening shortness of breath.  Patient was placed in the hospital with a diagnosis of Covid and discharged roughly 2 weeks ago.  He states that since then he does state some shortness of breath with ambulation although today he noticed that shortness of breath was significantly worse.  He has had a cough.  He denies any chest pain.  Does not have any fevers although feels like he has some chills.  Records reviewed. Per medical record review patient has a history of recent admission for COVID  Past Medical History:  Diagnosis Date  . Coronary artery disease   . Hypertension     Patient Active Problem List   Diagnosis Date Noted  . Atrial fibrillation with RVR ([place]) 01/04/2019  . Constipation 01/04/2019  . HLD (hyperlipidemia) 01/04/2019  . Hypertension   . Coronary artery disease   . Elevated troponin   . COVID-19     Past Surgical History:  Procedure Laterality Date  . Renal Bypass Surgery      Prior to Admission medications   Medication Sig Start Date End Date Taking? Authorizing Provider  atorvastatin (LIPITOR) 20 MG tablet Take 20 mg by mouth daily.    [provider]  diltiazem (CARDIZEM CD) 240 MG 24 hr capsule Take 240 mg by mouth daily.    [provider]  metoprolol tartrate (LOPRESSOR) 50 MG tablet Take 50 mg by mouth 2 (two) times daily.    [provider]  predniSONE (STERAPRED UNI-PAK 21 TAB) 10 MG (21) TBPK tablet Start 60 mg p.o. daily, taper 10 mg daily until finished 01/08/19   Max Sane, MD  traZODone (DESYREL) 50 MG tablet Take 50 mg by mouth at  bedtime. 11/18/18 11/18/19  [provider]  vitamin C (VITAMIN C) 500 MG tablet Take 1 tablet (500 mg total) by mouth daily. 01/09/19   Max Sane, MD  zinc sulfate 220 (50 Zn) MG capsule Take 1 capsule (220 mg total) by mouth daily. 01/09/19   Max Sane, MD    Allergies Patient has no known allergies.  No family history on file.  Social History Social History   Tobacco Use  . Smoking status: Former Research scientist (life sciences)  . Smokeless tobacco: Never Used  Substance Use Topics  . Alcohol use: Never  . Drug use: Never    Review of Systems Constitutional: No fever Eyes: No visual changes. ENT: No sore throat. Cardiovascular: Denies chest pain. Respiratory: Positive for shortness of breath and cough. Gastrointestinal: No abdominal pain.  No nausea, no vomiting.  No diarrhea.   Genitourinary: Negative for dysuria. Musculoskeletal: Negative for back pain. Skin: Negative for rash. Neurological: Negative for headaches, focal weakness or numbness.  ____________________________________________   PHYSICAL EXAM:  VITAL SIGNS: ED Triage Vitals  Enc Vitals Group     BP 01/24/19 1757 (!) 145/80     Pulse Rate 01/24/19 1757 (!) 102     Resp 01/24/19 1757 (!) 22     Temp 01/24/19 1757 98.1 F (36.7 C)     Temp Source 01/24/19 1757 Oral     SpO2 01/24/19 1757 (!) 79 %     Weight 01/24/19 1803  128 lb (58.1 kg)     Height 01/24/19 1803 5\' 10"  (1.778 m)   Constitutional: Alert and oriented.  Eyes: Conjunctivae are normal.  ENT      Head: Normocephalic and atraumatic.      Nose: No congestion/rhinnorhea.      Mouth/Throat: Mucous membranes are moist.      Neck: No stridor. Hematological/Lymphatic/Immunilogical: No cervical lymphadenopathy. Cardiovascular: Tachycardic, irregular rhythm.  No murmurs, rubs, or gallops.  Respiratory: Increased respiratory effort. Diffuse rhonchi.  Gastrointestinal: Soft and non tender. No rebound. No guarding.  Genitourinary:  Deferred Musculoskeletal: Normal range of motion in all extremities. No lower extremity edema. Neurologic:  Normal speech and language. No gross focal neurologic deficits are appreciated.  Skin:  Skin is warm, dry and intact. No rash noted. Psychiatric: Mood and affect are normal. Speech and behavior are normal. Patient exhibits appropriate insight and judgment.  ____________________________________________    LABS (pertinent positives/negatives)  Trop hs 9 CBC wbc 8.7, hgb 10.2, plt 209 BMP wnl except glu 110, ca 8.1  ____________________________________________   EKG  I, Nance Pear, attending physician, personally viewed and interpreted this EKG  EKG Time: 1816 Rate: 101 Rhythm: atrial fibrillation Axis: normal Intervals: qtc 501 QRS: RBBB ST changes: no st elevation Impression: abnormal ekg ____________________________________________    RADIOLOGY  CXR Increased bilateral opacities  ____________________________________________   PROCEDURES  Procedures  ____________________________________________   INITIAL IMPRESSION / ASSESSMENT AND PLAN / ED COURSE  Pertinent labs & imaging results that were available during my care of the patient were reviewed by me and considered in my medical decision making (see chart for details).   Patient presented to the emergency department today because of concerns for shortness of breath in the setting of known Covid.  Patient was hospitalized and discharged roughly 2 weeks ago for Covid.  Initially patient was hypoxic on room air although it did respond well to 4 L nasal cannula.  He stated the oxygen make him feel better.  Chest x-ray does show increased opacities.  Will plan on readmission for hypoxia and Covid. ____________________________________________   FINAL CLINICAL IMPRESSION(S) / ED DIAGNOSES  Final diagnoses:  COVID-19  Hypoxia     Note: This dictation was prepared with Dragon dictation. Any  transcriptional errors that result from this process are unintentional     Nance Pear, MD 01/24/19 2116

## 2019-01-24 NOTE — ED Notes (Signed)
Report given to Dawn, RN.  

## 2019-01-24 NOTE — ED Notes (Signed)
X-ray at bedside

## 2019-01-24 NOTE — ED Triage Notes (Signed)
Patient presents to the ED for shortness of breath.  Patient was diagnosed with covid19 approx. 2 weeks ago, treated and released from the hospital.  Patient is now very short of breath, hypoxic and tripoding in triage with retractions.  Patient states, "I've gotten short of breath again."

## 2019-01-25 LAB — BRAIN NATRIURETIC PEPTIDE: B Natriuretic Peptide: 1362 pg/mL — ABNORMAL HIGH (ref 0.0–100.0)

## 2019-01-25 LAB — COMPREHENSIVE METABOLIC PANEL
ALT: 17 U/L (ref 0–44)
AST: 23 U/L (ref 15–41)
Albumin: 2.6 g/dL — ABNORMAL LOW (ref 3.5–5.0)
Alkaline Phosphatase: 76 U/L (ref 38–126)
Anion gap: 12 (ref 5–15)
BUN: 20 mg/dL (ref 8–23)
CO2: 25 mmol/L (ref 22–32)
Calcium: 7.9 mg/dL — ABNORMAL LOW (ref 8.9–10.3)
Chloride: 102 mmol/L (ref 98–111)
Creatinine, Ser: 0.6 mg/dL — ABNORMAL LOW (ref 0.61–1.24)
GFR calc Af Amer: >60 mL/min (ref >60)
GFR calc non Af Amer: >60 mL/min (ref >60)
Glucose, Bld: 123 mg/dL — ABNORMAL HIGH (ref 70–99)
Potassium: 3.2 mmol/L — ABNORMAL LOW (ref 3.5–5.1)
Sodium: 139 mmol/L (ref 135–145)
Total Bilirubin: 0.7 mg/dL (ref 0.3–1.2)
Total Protein: 6.4 g/dL — ABNORMAL LOW (ref 6.5–8.1)

## 2019-01-25 LAB — CBC WITH DIFFERENTIAL/PLATELET
Abs Immature Granulocytes: 0.05 10*3/uL (ref 0.00–0.07)
Basophils Absolute: 0 10*3/uL (ref 0.0–0.1)
Basophils Relative: 0 %
Eosinophils Absolute: 0 10*3/uL (ref 0.0–0.5)
Eosinophils Relative: 0 %
HCT: 33.4 % — ABNORMAL LOW (ref 39.0–52.0)
Hemoglobin: 11.2 g/dL — ABNORMAL LOW (ref 13.0–17.0)
Immature Granulocytes: 1 %
Lymphocytes Relative: 8 %
Lymphs Abs: 0.7 10*3/uL (ref 0.7–4.0)
MCH: 29 pg (ref 26.0–34.0)
MCHC: 33.5 g/dL (ref 30.0–36.0)
MCV: 86.5 fL (ref 80.0–100.0)
Monocytes Absolute: 0.1 10*3/uL (ref 0.1–1.0)
Monocytes Relative: 2 %
Neutro Abs: 7.1 10*3/uL (ref 1.7–7.7)
Neutrophils Relative %: 89 %
Platelets: 210 10*3/uL (ref 150–400)
RBC: 3.86 MIL/uL — ABNORMAL LOW (ref 4.22–5.81)
RDW: 14.9 % (ref 11.5–15.5)
WBC: 7.9 10*3/uL (ref 4.0–10.5)
nRBC: 0 % (ref 0.0–0.2)

## 2019-01-25 LAB — EXPECTORATED SPUTUM ASSESSMENT W GRAM STAIN, RFLX TO RESP C

## 2019-01-25 LAB — MAGNESIUM: Magnesium: 1.9 mg/dL (ref 1.7–2.4)

## 2019-01-25 LAB — RESPIRATORY PANEL BY PCR

## 2019-01-25 LAB — ABO/RH: ABO/RH(D): O POS

## 2019-01-25 LAB — PROTIME-INR
INR: 2.6 — ABNORMAL HIGH (ref 0.8–1.2)
Prothrombin Time: 28.1 seconds — ABNORMAL HIGH (ref 11.4–15.2)

## 2019-01-25 LAB — TROPONIN I (HIGH SENSITIVITY): Troponin I (High Sensitivity): 8 ng/L (ref <18)

## 2019-01-25 LAB — C-REACTIVE PROTEIN: CRP: 4 mg/dL — ABNORMAL HIGH (ref <1.0)

## 2019-01-25 LAB — FERRITIN: Ferritin: 105 ng/mL (ref 24–336)

## 2019-01-25 LAB — PHOSPHORUS: Phosphorus: 3.3 mg/dL (ref 2.5–4.6)

## 2019-01-25 LAB — STREP PNEUMONIAE URINARY ANTIGEN: Strep Pneumo Urinary Antigen: NEGATIVE

## 2019-01-25 MED ORDER — POTASSIUM CHLORIDE 10 MEQ/100ML IV SOLN
10.0000 meq | INTRAVENOUS | Status: AC
  Administered 2019-01-25: 10 meq via INTRAVENOUS

## 2019-01-25 MED ORDER — WARFARIN SODIUM 1 MG PO TABS
1.5000 mg | ORAL_TABLET | Freq: Once | ORAL | Status: AC
  Administered 2019-01-25: 19:00:00 1.5 mg via ORAL
  Filled 2019-01-25: qty 1

## 2019-01-25 MED ORDER — MAGNESIUM SULFATE 2 GM/50ML IV SOLN
2.0000 g | Freq: Once | INTRAVENOUS | Status: AC
  Administered 2019-01-25: 2 g via INTRAVENOUS
  Filled 2019-01-25 (×2): qty 50

## 2019-01-25 MED ORDER — POTASSIUM CHLORIDE 10 MEQ/100ML IV SOLN
10.0000 meq | INTRAVENOUS | Status: AC
  Administered 2019-01-25 (×3): 10 meq via INTRAVENOUS
  Filled 2019-01-25 (×4): qty 100

## 2019-01-25 MED ORDER — WARFARIN - PHARMACIST DOSING INPATIENT
Freq: Every day | Status: DC
  Filled 2019-01-25: qty 1

## 2019-01-25 NOTE — ED Notes (Signed)
DNR bracelet placed on left wrist. 

## 2019-01-25 NOTE — ED Notes (Signed)
Pt is resting in no acute distress.  Both lines flushed and locked with NS.  Toileting offered and declined.  Bed locked in lowest position, call bell provided.  Pt requested ice water which was provided.  No other needs expressed at this time.

## 2019-01-25 NOTE — Consult Note (Signed)
[place] for Warfarin  Indication: atrial fibrillation  No Known Allergies  Patient Measurements: Height: 5\' 10"  (177.8 cm) Weight: 128 lb (58.1 kg) IBW/kg (Calculated) : 73   Vital Signs: Temp: 97.7 F (36.5 C) (12/28 0756) Temp Source: Oral (12/28 0756) BP: 146/68 (12/28 0502) Pulse Rate: 119 (12/28 0502)  Labs: Recent Labs    01/24/19 2027 01/25/19 0326  HGB 10.2* 11.2*  HCT 31.8* 33.4*  PLT 209 210  LABPROT 34.0* 28.1*  INR 3.4* 2.6*  CREATININE 0.79 0.60*  TROPONINIHS 9 8    Estimated Creatinine Clearance: 52.5 mL/min (A) (by C-G formula based on SCr of 0.6 mg/dL (L)).   Medications:  Per chart review:  Warfarin 2 mg Sat & Sun  Warfarin 3 mg Mon through Fri  Per patient, last warfarin dose was 2 mg on 12/24 or 12/25 as a result of being told to hold warfarin d/t INR being elevated. He was told to resume 2 mg and was scheduled to have his INR rechecked today.   Assessment: Pharmacy has been consulted for Warfarin dosing in patient presenting with COVID-19 who has a PMH significant for atrial fibrillation. Patient was recently discharged on 12/14 with an elevated INR and discharge instructions indicated for the patient to HOLD warfarin for 2 days and f/u with his PCP. Per chart Care Everywhere, on 12/22 INR was 4.8; patient was instructed to HOLD warfarin 12/23 and 12/24 then resume 2 mg daily and f/u INR on 12/28.  Drug interactions: azithromycin - may increase INR  Today's INR: Therapeutic.   Date INR  Dose  12/27 3.4 ----- 12/28 2.6   Goal of Therapy:  INR 2-3 Monitor platelets by anticoagulation protocol: Yes   Plan:  1. Given patient has self-held warfarin, per his report, 2 mg over the weekend and INR is now therapetutic. Additionally, patient was started on azithromycin which could increase his INR. Will resume warfarin 1.5 mg x1 dose this evening (home dose 3 mg). Will check INR daily and CBC every 3 days.     Rowland Lathe 01/25/2019,8:19 AM

## 2019-01-25 NOTE — Progress Notes (Addendum)
Brief Note:  HPI: [name] is a 85 y.o. male with medical history significant for hypertension, atrial fibrillation on Coumadin, CAD, hospitalized from 01/04/2019 to 01/08/2019 with COVID-19 pneumonia returns to the emergency room with worsening shortness of breath and cough.  He denies fever or chest pain.  Denies nausea vomiting and diarrhea or abdominal pain.  During his recent hospitalization he was treated with remdesivir and steroids.  Patient had an elevated BNP on last hospitalized and had an echocardiogram that was within normal limits ED Course: On arrival to the emergency room he was afebrile with a temperature of 98.1 blood pressure 145/80 he was tachycardic at 102 and tachypneic at 22 with oxygen saturation of 79% on room air.  On his blood work he had an elevated fibrin derivatives at 1836 but this was down from 2000 352 at his discharge 2 weeks prior.  Procalcitonin was less than 0.1.  INR 3.4.  Troponin was 9 chest x-ray showed multifocal pneumonia versus edema.  Patient required 4 L O2 via nasal cannula to improve sats to the mid 90s  Subjective: Patient is stable on 2 L of oxygen via nasal cannula.  Denies any fever or worsening shortness of breath.  Objective: Vital signs in last 24 hours: Temp:  [97.7 F (36.5 C)-98.1 F (36.7 C)] 97.7 F (36.5 C) (12/28 0756) Pulse Rate:  [94-119] 95 (12/28 1258) Resp:  [16-22] 16 (12/28 1258) BP: (145-166)/(68-95) 147/95 (12/28 1258) SpO2:  [79 %-97 %] 90 % (12/28 1258) Weight:  [58.1 kg] 58.1 kg (12/27 1803)  Intake/Output from previous day: 12/27 0701 - 12/28 0700 In: 250  Out: 200 [Urine:200] Intake/Output this shift: No intake/output data recorded.  Constitutional: NAD, alert and oriented x 3 Eyes: PERRL, lids and conjunctivae normal ENMT: Mucous membranes are moist. No pharyngeal injection Neck: normal, supple, no masses, no thyromegaly Respiratory: increased respiratory effort, wheezes and crackles bilateral lung  fields.  Cardiovascular: Regular rate and rhythm, no murmurs / rubs / gallops. No extremity edema. 2+ pedal pulses. No carotid bruits.  Abdomen: no tenderness, no masses palpated. No hepatosplenomegaly. Bowel sounds positive.  Musculoskeletal: no clubbing / cyanosis. No joint deformity upper and lower extremities.  Skin: no rashes, lesions, ulcers.  Neurologic: No gross focal neurologic deficit. Psychiatric: Normal mood and affect.  Results for orders placed or performed during the hospital encounter of 01/24/19 (from the past 24 hour(s))  CBC with Differential     Status: Abnormal   Collection Time: 01/24/19  8:27 PM  Result Value Ref Range   WBC 8.7 4.0 - 10.5 K/uL   RBC 3.49 (L) 4.22 - 5.81 MIL/uL   Hemoglobin 10.2 (L) 13.0 - 17.0 g/dL   HCT 31.8 (L) 39.0 - 52.0 %   MCV 91.1 80.0 - 100.0 fL   MCH 29.2 26.0 - 34.0 pg   MCHC 32.1 30.0 - 36.0 g/dL   RDW 14.8 11.5 - 15.5 %   Platelets 209 150 - 400 K/uL   nRBC 0.0 0.0 - 0.2 %   Neutrophils Relative % 72 %   Neutro Abs 6.3 1.7 - 7.7 K/uL   Lymphocytes Relative 15 %   Lymphs Abs 1.3 0.7 - 4.0 K/uL   Monocytes Relative 10 %   Monocytes Absolute 0.9 0.1 - 1.0 K/uL   Eosinophils Relative 1 %   Eosinophils Absolute 0.1 0.0 - 0.5 K/uL   Basophils Relative 1 %   Basophils Absolute 0.0 0.0 - 0.1 K/uL   Immature Granulocytes 1 %  Abs Immature Granulocytes 0.04 0.00 - 0.07 K/uL  Basic metabolic panel     Status: Abnormal   Collection Time: 01/24/19  8:27 PM  Result Value Ref Range   Sodium 140 135 - 145 mmol/L   Potassium 3.7 3.5 - 5.1 mmol/L   Chloride 103 98 - 111 mmol/L   CO2 27 22 - 32 mmol/L   Glucose, Bld 110 (H) 70 - 99 mg/dL   BUN 23 8 - 23 mg/dL   Creatinine, Ser 0.79 0.61 - 1.24 mg/dL   Calcium 8.1 (L) 8.9 - 10.3 mg/dL   GFR calc non Af Amer >60 >60 mL/min   GFR calc Af Amer >60 >60 mL/min   Anion gap 10 5 - 15  Fibrin derivatives D-Dimer ([facility] only)     Status: Abnormal   Collection Time: 01/24/19  8:27 PM  Result  Value Ref Range   Fibrin derivatives D-dimer ([facility]) 1,836.65 (H) 0.00 - 499.00 ng/mL (FEU)  Troponin I (High Sensitivity)     Status: None   Collection Time: 01/24/19  8:27 PM  Result Value Ref Range   Troponin I (High Sensitivity) 9 <18 ng/L  Procalcitonin - Baseline     Status: None   Collection Time: 01/24/19  8:27 PM  Result Value Ref Range   Procalcitonin <0.10 ng/mL  Protime-INR     Status: Abnormal   Collection Time: 01/24/19  8:27 PM  Result Value Ref Range   Prothrombin Time 34.0 (H) 11.4 - 15.2 seconds   INR 3.4 (H) 0.8 - 1.2  ABO/Rh     Status: None   Collection Time: 01/24/19  8:27 PM  Result Value Ref Range   ABO/RH(D)      O POS Performed at [facility], [address] [place], [place] [address]   Culture, blood (Routine X 2) w Reflex to ID Panel     Status: None (Preliminary result)   Collection Time: 01/24/19 10:07 PM   Specimen: BLOOD  Result Value Ref Range   Specimen Description BLOOD RIGHT FOREARM    Special Requests      BOTTLES DRAWN AEROBIC AND ANAEROBIC Blood Culture adequate volume   Culture      NO GROWTH < 12 HOURS Performed at [facility], [address] [place], [place] [address]    Report Status PENDING   Culture, blood (Routine X 2) w Reflex to ID Panel     Status: None (Preliminary result)   Collection Time: 01/24/19 10:07 PM   Specimen: BLOOD  Result Value Ref Range   Specimen Description BLOOD RIGHT ANTECUBITAL    Special Requests      BOTTLES DRAWN AEROBIC AND ANAEROBIC Blood Culture adequate volume   Culture      NO GROWTH < 12 HOURS Performed at [facility], [address], [place], [place] [address]    Report Status PENDING   Protime-INR     Status: Abnormal   Collection Time: 01/25/19  3:26 AM  Result Value Ref Range   Prothrombin Time 28.1 (H) 11.4 - 15.2 seconds   INR 2.6 (H) 0.8 - 1.2  Strep pneumoniae urinary antigen     Status: None   Collection Time: 01/25/19  3:26 AM  Result Value Ref  Range   Strep Pneumo Urinary Antigen NEGATIVE NEGATIVE  Respiratory Panel by PCR     Status: None   Collection Time: 01/25/19  3:26 AM   Specimen: Nasopharyngeal Swab; Respiratory  Result Value Ref Range   Adenovirus  NOT DETECTED NOT DETECTED   Coronavirus 229E NOT DETECTED NOT DETECTED   Coronavirus HKU1 NOT DETECTED NOT DETECTED   Coronavirus NL63 NOT DETECTED NOT DETECTED   Coronavirus OC43 NOT DETECTED NOT DETECTED   Metapneumovirus NOT DETECTED NOT DETECTED   Rhinovirus / Enterovirus NOT DETECTED NOT DETECTED   Influenza A NOT DETECTED NOT DETECTED   Influenza B NOT DETECTED NOT DETECTED   Parainfluenza Virus 1 NOT DETECTED NOT DETECTED   Parainfluenza Virus 2 NOT DETECTED NOT DETECTED   Parainfluenza Virus 3 NOT DETECTED NOT DETECTED   Parainfluenza Virus 4 NOT DETECTED NOT DETECTED   Respiratory Syncytial Virus NOT DETECTED NOT DETECTED   Bordetella pertussis NOT DETECTED NOT DETECTED   Chlamydophila pneumoniae NOT DETECTED NOT DETECTED   Mycoplasma pneumoniae NOT DETECTED NOT DETECTED  Brain natriuretic peptide     Status: Abnormal   Collection Time: 01/25/19  3:26 AM  Result Value Ref Range   B Natriuretic Peptide 1,362.0 (H) 0.0 - 100.0 pg/mL  C-reactive protein     Status: Abnormal   Collection Time: 01/25/19  3:26 AM  Result Value Ref Range   CRP 4.0 (H) <1.0 mg/dL  CBC with Differential/Platelet     Status: Abnormal   Collection Time: 01/25/19  3:26 AM  Result Value Ref Range   WBC 7.9 4.0 - 10.5 K/uL   RBC 3.86 (L) 4.22 - 5.81 MIL/uL   Hemoglobin 11.2 (L) 13.0 - 17.0 g/dL   HCT 33.4 (L) 39.0 - 52.0 %   MCV 86.5 80.0 - 100.0 fL   MCH 29.0 26.0 - 34.0 pg   MCHC 33.5 30.0 - 36.0 g/dL   RDW 14.9 11.5 - 15.5 %   Platelets 210 150 - 400 K/uL   nRBC 0.0 0.0 - 0.2 %   Neutrophils Relative % 89 %   Neutro Abs 7.1 1.7 - 7.7 K/uL   Lymphocytes Relative 8 %   Lymphs Abs 0.7 0.7 - 4.0 K/uL   Monocytes Relative 2 %   Monocytes Absolute 0.1 0.1 - 1.0 K/uL    Eosinophils Relative 0 %   Eosinophils Absolute 0.0 0.0 - 0.5 K/uL   Basophils Relative 0 %   Basophils Absolute 0.0 0.0 - 0.1 K/uL   Immature Granulocytes 1 %   Abs Immature Granulocytes 0.05 0.00 - 0.07 K/uL  Comprehensive metabolic panel     Status: Abnormal   Collection Time: 01/25/19  3:26 AM  Result Value Ref Range   Sodium 139 135 - 145 mmol/L   Potassium 3.2 (L) 3.5 - 5.1 mmol/L   Chloride 102 98 - 111 mmol/L   CO2 25 22 - 32 mmol/L   Glucose, Bld 123 (H) 70 - 99 mg/dL   BUN 20 8 - 23 mg/dL   Creatinine, Ser 0.60 (L) 0.61 - 1.24 mg/dL   Calcium 7.9 (L) 8.9 - 10.3 mg/dL   Total Protein 6.4 (L) 6.5 - 8.1 g/dL   Albumin 2.6 (L) 3.5 - 5.0 g/dL   AST 23 15 - 41 U/L   ALT 17 0 - 44 U/L   Alkaline Phosphatase 76 38 - 126 U/L   Total Bilirubin 0.7 0.3 - 1.2 mg/dL   GFR calc non Af Amer >60 >60 mL/min   GFR calc Af Amer >60 >60 mL/min   Anion gap 12 5 - 15  Magnesium     Status: None   Collection Time: 01/25/19  3:26 AM  Result Value Ref Range   Magnesium 1.9 1.7 - 2.4  mg/dL  Phosphorus     Status: None   Collection Time: 01/25/19  3:26 AM  Result Value Ref Range   Phosphorus 3.3 2.5 - 4.6 mg/dL  Ferritin     Status: None   Collection Time: 01/25/19  3:26 AM  Result Value Ref Range   Ferritin 105 24 - 336 ng/mL  Troponin I (High Sensitivity)     Status: None   Collection Time: 01/25/19  3:26 AM  Result Value Ref Range   Troponin I (High Sensitivity) 8 <18 ng/L    Studies/Results: DG Chest 2 View  Result Date: 01/04/2019 CLINICAL DATA:  Increasing shortness of breath and cough. EXAM: CHEST - 2 VIEW COMPARISON:  CT chest 11/29/2015.  PA and lateral chest 02/01/2014. FINDINGS: The lungs are markedly emphysematous. There is patchy airspace disease in the left upper and lower lobes. Biapical scar is present. Small bilateral pleural effusions are seen. Heart size is normal. Aortic atherosclerosis. Remote L1 and L2 compression fractures are unchanged since the prior CT. The  patient has a T7 compression fracture which is new since the prior CT. IMPRESSION: Patchy airspace disease in the left chest worrisome for pneumonia. Emphysema. Small bilateral pleural effusions. Atherosclerosis. Remote L1 and L2 compression fractures. T7 compression fracture is new since 2017 and appears remote. Electronically Signed   By: Inge Rise M.D.   On: 01/04/2019 11:47   DG Chest Portable 1 View  Result Date: 01/24/2019 CLINICAL DATA:  Shortness of breath, diagnosed with COVID-19 approximately 2 weeks ago, treated and released from hospital, now very short of breath, hypoxic, tripod Ng in triage with retractions, history coronary artery disease, atrial fibrillation, hypertension, former smoker EXAM: PORTABLE CHEST 1 VIEW COMPARISON:  Portable exam 1818 hours compared to 01/04/2019 FINDINGS: Rotated to the LEFT. Normal heart size and mediastinal contours. Atherosclerotic calcification aorta. Emphysematous changes with significantly increased BILATERAL pulmonary infiltrates greatest at RIGHT base. Bibasilar effusions. Findings could represent multifocal pneumonia or pulmonary edema. No pneumothorax. Bones demineralized. IMPRESSION: Significantly increased BILATERAL pulmonary infiltrates question multifocal pneumonia or pulmonary edema. BILATERAL pleural effusions and underlying COPD changes. Aortic Atherosclerosis (ICD10-I70.0). Electronically Signed   By: Lavonia Dana M.D.   On: 01/24/2019 18:57   ECHOCARDIOGRAM COMPLETE  Result Date: 01/07/2019   ECHOCARDIOGRAM REPORT   Patient Name:   [name] Date of Exam: 01/06/2019 Medical Rec #:  [id number]         Height:       69.0 in Accession #:    [id number]        Weight:       130.0 lb Date of Birth:  03-12-1928         BSA:          1.72 m Patient Age:    57 years          BP:           128/74 mmHg Patient Gender: M                 HR:           84 bpm. Exam Location:  [facility] Procedure: 2D Echo, Cardiac Doppler and Color Doppler  Indications:     COVID-19                  R06.00 Dyspnea  History:         Patient has no prior history of Echocardiogram examinations.  Risk Factors:Hypertension. Coronary artery disease.  Sonographer:     Wilford Sports Rodgers-Jones Referring Phys:  [place] Diagnosing Phys: Bartholome Bill MD IMPRESSIONS  1. Left ventricular ejection fraction, by visual estimation, is 55 to 60%. The left ventricle has normal function. Left ventricular septal wall thickness was mildly increased. Mildly increased left ventricular posterior wall thickness. There is mildly increased left ventricular hypertrophy.  2. Left ventricular diastolic parameters are consistent with Grade I diastolic dysfunction (impaired relaxation).  3. Global right ventricle has normal systolic function.The right ventricular size is mildly enlarged. No increase in right ventricular wall thickness.  4. Left atrial size was mildly dilated.  5. Right atrial size was mildly dilated.  6. The mitral valve is grossly normal. Mild to moderate mitral valve regurgitation.  7. The tricuspid valve is grossly normal. Tricuspid valve regurgitation is trivial.  8. The aortic valve is abnormal. Aortic valve regurgitation is trivial. Mild to moderate aortic valve sclerosis/calcification without any evidence of aortic stenosis.  9. The pulmonic valve was not well visualized. Pulmonic valve regurgitation is trivial. 10. Moderately elevated pulmonary artery systolic pressure. 11. The atrial septum is grossly normal. FINDINGS  Left Ventricle: Left ventricular ejection fraction, by visual estimation, is 55 to 60%. The left ventricle has normal function. The left ventricle is not well visualized. Mildly increased left ventricular posterior wall thickness. There is mildly increased left ventricular hypertrophy. Left ventricular diastolic parameters are consistent with Grade I diastolic dysfunction (impaired relaxation). Right Ventricle: The right ventricular size  is mildly enlarged. No increase in right ventricular wall thickness. Global RV systolic function is has normal systolic function. The tricuspid regurgitant velocity is 3.17 m/s, and with an assumed right atrial  pressure of 10 mmHg, the estimated right ventricular systolic pressure is moderately elevated at 50.3 mmHg. Left Atrium: Left atrial size was mildly dilated. Right Atrium: Right atrial size was mildly dilated Pericardium: There is no evidence of pericardial effusion. There is a small pleural effusion. Mitral Valve: The mitral valve is grossly normal. Mild to moderate mitral valve regurgitation. Tricuspid Valve: The tricuspid valve is grossly normal. Tricuspid valve regurgitation is trivial. Aortic Valve: The aortic valve is abnormal. Aortic valve regurgitation is trivial. Aortic regurgitation PHT measures 333 msec. Mild to moderate aortic valve sclerosis/calcification is present, without any evidence of aortic stenosis. Pulmonic Valve: The pulmonic valve was not well visualized. Pulmonic valve regurgitation is trivial. Pulmonic regurgitation is trivial. Aorta: The aortic root is normal in size and structure. IAS/Shunts: The atrial septum is grossly normal.  LEFT VENTRICLE PLAX 2D LVIDd:         4.29 cm LVIDs:         3.09 cm LV PW:         1.01 cm LV IVS:        0.83 cm LVOT diam:     2.00 cm LV SV:         45 ml LV SV Index:   26.70 LVOT Area:     3.14 cm  RIGHT VENTRICLE RV Basal diam:  4.84 cm RV S prime:     9.37 cm/s TAPSE (M-mode): 1.2 cm LEFT ATRIUM             Index       RIGHT ATRIUM           Index LA diam:        5.50 cm 3.20 cm/m  RA Area:     24.00 cm LA Vol (A2C):  80.7 ml 46.91 ml/m RA Volume:   86.20 ml  50.11 ml/m LA Vol (A4C):   70.7 ml 41.10 ml/m LA Biplane Vol: 76.9 ml 44.70 ml/m  AORTIC VALVE LVOT Vmax:   97.40 cm/s LVOT Vmean:  64.300 cm/s LVOT VTI:    0.184 m AI PHT:      333 msec  AORTA Ao Root diam: 3.30 cm MV E velocity: 101.17 cm/s 103 cm/s TRICUSPID VALVE                                      TR Peak grad:   40.3 mmHg                                     TR Vmax:        354.00 cm/s                                      SHUNTS                                     Systemic VTI:  0.18 m                                     Systemic Diam: 2.00 cm  Bartholome Bill MD Electronically signed by Bartholome Bill MD Signature Date/Time: 01/07/2019/9:32:50 AM    Final     Scheduled Meds: . albuterol  2 puff Inhalation Q6H  . vitamin C  500 mg Oral Daily  . methylPREDNISolone (SOLU-MEDROL) injection  0.25 mg/kg Intravenous Q12H  . multivitamin with minerals  1 tablet Oral Daily  . warfarin  1.5 mg Oral ONCE-1800  . Warfarin - Pharmacist Dosing Inpatient   Does not apply q1800  . zinc sulfate  220 mg Oral Daily   Continuous Infusions: . azithromycin Stopped (01/25/19 0110)  . cefTRIAXone (ROCEPHIN)  IV Stopped (01/25/19 0010)  . potassium chloride 10 mEq (01/25/19 1256)   PRN Meds:guaiFENesin-dextromethorphan  Assessment/Plan: Acute respiratory failure with hypoxia (HCC) related to viral pneumonia/COVID-19 -Currently on 2-3 L of supplemental oxygen via nasal cannula --Patient was recently hospitalized with COVID-19 pneumonia but has persistent symptoms and worsening shortness of breath. --Continue supplemental oxygen to keep sats over 90% -Wean down as possible to room air -Bronchodilator --Repeat remdesivir not indicated at this time --Dexamethasone IV x1; now on Solu-Medrol; continue and vitamins --Procalcitonin was less than 0.10 but patient was started on  empiric antibiotic therapy from admission -We will continue Rocephin and azithromycin IV -Monitor inflammatory markers  Supratherapeutic INR --Hold Coumadin for now --Daily PT/INR -Pharmacy consulted for Coumadin management    Atrial fibrillation, chronic (HCC) --Rate controlled on metoprolol and diltiazem --Holding Coumadin due to INR of 3.4 -Consulted pharmacy for management; appreciated  Essential  hypertension: Stable --Continue metoprolol and diltiazem    Coronary artery disease --No complaints of chest pain.  EKG showed A. fib but no acute ST-T wave changes, troponin negative --Continue statin, aspirin and beta-blocker  Electrolyte imbalance: Found to be hypokalemic and hypomagnesemic -Status post replacement -We will check daily   LOS: 1 day  [name]

## 2019-01-25 NOTE — ED Notes (Signed)
Sleeping.  In nad.  Oxygen at 6 liters canula.

## 2019-01-26 LAB — CBC WITH DIFFERENTIAL/PLATELET
Abs Immature Granulocytes: 0.04 10*3/uL (ref 0.00–0.07)
Basophils Absolute: 0 10*3/uL (ref 0.0–0.1)
Basophils Relative: 0 %
Eosinophils Absolute: 0 10*3/uL (ref 0.0–0.5)
Eosinophils Relative: 0 %
HCT: 29.2 % — ABNORMAL LOW (ref 39.0–52.0)
Hemoglobin: 9.8 g/dL — ABNORMAL LOW (ref 13.0–17.0)
Immature Granulocytes: 1 %
Lymphocytes Relative: 11 %
Lymphs Abs: 0.9 10*3/uL (ref 0.7–4.0)
MCH: 29.4 pg (ref 26.0–34.0)
MCHC: 33.6 g/dL (ref 30.0–36.0)
MCV: 87.7 fL (ref 80.0–100.0)
Monocytes Absolute: 0.6 10*3/uL (ref 0.1–1.0)
Monocytes Relative: 8 %
Neutro Abs: 6.4 10*3/uL (ref 1.7–7.7)
Neutrophils Relative %: 80 %
Platelets: 188 10*3/uL (ref 150–400)
RBC: 3.33 MIL/uL — ABNORMAL LOW (ref 4.22–5.81)
RDW: 15.1 % (ref 11.5–15.5)
WBC: 7.9 10*3/uL (ref 4.0–10.5)
nRBC: 0 % (ref 0.0–0.2)

## 2019-01-26 LAB — COMPREHENSIVE METABOLIC PANEL
ALT: 16 U/L (ref 0–44)
AST: 19 U/L (ref 15–41)
Albumin: 2.3 g/dL — ABNORMAL LOW (ref 3.5–5.0)
Alkaline Phosphatase: 70 U/L (ref 38–126)
Anion gap: 8 (ref 5–15)
BUN: 27 mg/dL — ABNORMAL HIGH (ref 8–23)
CO2: 25 mmol/L (ref 22–32)
Calcium: 8 mg/dL — ABNORMAL LOW (ref 8.9–10.3)
Chloride: 106 mmol/L (ref 98–111)
Creatinine, Ser: 0.7 mg/dL (ref 0.61–1.24)
GFR calc Af Amer: >60 mL/min (ref >60)
GFR calc non Af Amer: >60 mL/min (ref >60)
Glucose, Bld: 120 mg/dL — ABNORMAL HIGH (ref 70–99)
Potassium: 4.5 mmol/L (ref 3.5–5.1)
Sodium: 139 mmol/L (ref 135–145)
Total Bilirubin: 0.5 mg/dL (ref 0.3–1.2)
Total Protein: 5.9 g/dL — ABNORMAL LOW (ref 6.5–8.1)

## 2019-01-26 LAB — PROTIME-INR
INR: 4.5 (ref 0.8–1.2)
INR: 4.5 (ref 0.8–1.2)
Prothrombin Time: 43.1 seconds — ABNORMAL HIGH (ref 11.4–15.2)
Prothrombin Time: 42.7 seconds — ABNORMAL HIGH (ref 11.4–15.2)

## 2019-01-26 LAB — PHOSPHORUS: Phosphorus: 2.9 mg/dL (ref 2.5–4.6)

## 2019-01-26 LAB — C-REACTIVE PROTEIN: CRP: 2.1 mg/dL — ABNORMAL HIGH (ref <1.0)

## 2019-01-26 LAB — MAGNESIUM: Magnesium: 2.2 mg/dL (ref 1.7–2.4)

## 2019-01-26 LAB — FERRITIN: Ferritin: 98 ng/mL (ref 24–336)

## 2019-01-26 MED ORDER — TRAZODONE HCL 50 MG PO TABS: 50.0000 mg | ORAL_TABLET | Freq: Every evening | ORAL | Status: DC | PRN

## 2019-01-26 MED ORDER — CEFDINIR 300 MG PO CAPS
300.0000 mg | ORAL_CAPSULE | Freq: Two times a day (BID) | ORAL | Status: DC
  Administered 2019-01-27: 11:00:00 300 mg via ORAL
  Filled 2019-01-26 (×2): qty 1

## 2019-01-26 MED ORDER — DILTIAZEM HCL ER COATED BEADS 300 MG PO CP24
300.0000 mg | ORAL_CAPSULE | Freq: Every day | ORAL | Status: DC
  Administered 2019-01-26 – 2019-01-31 (×6): 300 mg via ORAL
  Filled 2019-01-26 (×7): qty 1

## 2019-01-26 MED ORDER — LORATADINE 10 MG PO TABS
10.0000 mg | ORAL_TABLET | Freq: Every day | ORAL | Status: DC
  Administered 2019-01-26 – 2019-01-31 (×6): 10 mg via ORAL
  Filled 2019-01-26 (×7): qty 1

## 2019-01-26 MED ORDER — METOPROLOL TARTRATE 50 MG PO TABS
50.0000 mg | ORAL_TABLET | Freq: Two times a day (BID) | ORAL | Status: DC
  Administered 2019-01-26 – 2019-01-31 (×11): 50 mg via ORAL
  Filled 2019-01-26 (×11): qty 1

## 2019-01-26 MED ORDER — TIOTROPIUM BROMIDE MONOHYDRATE 18 MCG IN CAPS
18.0000 ug | ORAL_CAPSULE | Freq: Every day | RESPIRATORY_TRACT | Status: DC
  Administered 2019-01-27 – 2019-01-31 (×5): 18 ug via RESPIRATORY_TRACT
  Filled 2019-01-26: qty 5

## 2019-01-26 MED ORDER — ATORVASTATIN CALCIUM 20 MG PO TABS
20.0000 mg | ORAL_TABLET | Freq: Every day | ORAL | Status: DC
  Administered 2019-01-26 – 2019-01-31 (×6): 20 mg via ORAL
  Filled 2019-01-26 (×6): qty 1

## 2019-01-26 MED ORDER — BISACODYL 5 MG PO TBEC
5.0000 mg | DELAYED_RELEASE_TABLET | Freq: Every day | ORAL | Status: DC | PRN
  Filled 2019-01-26: qty 1

## 2019-01-26 NOTE — Consult Note (Addendum)
[name] for Warfarin  Indication: atrial fibrillation  No Known Allergies  Patient Measurements: Height: 5\' 10"  (177.8 cm) Weight: 128 lb (58.1 kg) IBW/kg (Calculated) : 73   Vital Signs: BP: 166/104 (12/29 0635) Pulse Rate: 93 (12/29 0635)  Labs: Recent Labs    01/24/19 2027 01/25/19 0326 01/26/19 0410 01/26/19 0636  HGB 10.2* 11.2* 9.8*  --   HCT 31.8* 33.4* 29.2*  --   PLT 209 210 188  --   LABPROT 34.0* 28.1*  --  43.1*  INR 3.4* 2.6*  --  4.5*  CREATININE 0.79 0.60* 0.70  --   TROPONINIHS 9 8  --   --     Estimated Creatinine Clearance: 52.5 mL/min (by C-G formula based on SCr of 0.7 mg/dL).   Medications:  Per chart review:  Warfarin 2 mg Sat & Sun  Warfarin 3 mg Mon through Fri  Per patient, last warfarin dose was 2 mg on 12/24 or 12/25 as a result of being told to hold warfarin d/t INR being elevated. He was told to resume 2 mg and was scheduled to have his INR rechecked today.   Assessment: Pharmacy has been consulted for Warfarin dosing in patient presenting with COVID-19 who has a PMH significant for atrial fibrillation. Patient was recently discharged on 12/14 with an elevated INR and discharge instructions indicated for the patient to HOLD warfarin for 2 days and f/u with his PCP. Per chart Care Everywhere, on 12/22 INR was 4.8; patient was instructed to HOLD warfarin 12/23 and 12/24 then resume 2 mg daily and f/u INR on 12/28.  Drug interactions: azithromycin - may increase INR  Today's INR: Supratherapeutic. S/p held doses (at home) and reduce dose yesterday. Per ED nurse, there are no bleeding concerns. INR is likely elevated d/t infection and azithromycin.    Hgb 11.2 > 9.8  Date INR  Dose  12/27 3.4 ----- 12/28 2.6 1.5 mg  12/29 4.5    Goal of Therapy:  INR 2-3 Monitor platelets by anticoagulation protocol: Yes   Plan:  1. Will HOLD warfarin for today. Will recheck INR today for confirmatory INR.  MD made aware of INR and plan. Will check INR and CBC with AM labs.   Update @ 1139: repeat INR 4.5  Arlyne Brandes R Deyja Sochacki 01/26/2019,7:30 AM

## 2019-01-26 NOTE — ED Notes (Signed)
Date and time results received: 01/26/19 8:45 AM   Test: INR Critical Value: 4.5  Name of Provider Notified: Thornell Mule

## 2019-01-26 NOTE — Progress Notes (Signed)
Brief Note:  HPI: [name] is a 85 y.o. male with medical history significant for hypertension, atrial fibrillation on Coumadin, CAD, hospitalized from 01/04/2019 to 01/08/2019 with COVID-19 pneumonia returns to the emergency room with worsening shortness of breath and cough.  He denies fever or chest pain.  Denies nausea vomiting and diarrhea or abdominal pain.  During his recent hospitalization he was treated with remdesivir and steroids.  Patient had an elevated BNP on last hospitalized and had an echocardiogram that was within normal limits ED Course: On arrival to the emergency room he was afebrile with a temperature of 98.1 blood pressure 145/80 he was tachycardic at 102 and tachypneic at 22 with oxygen saturation of 79% on room air.  On his blood work he had an elevated fibrin derivatives at 1836 but this was down from 2000 352 at his discharge 2 weeks prior.  Procalcitonin was less than 0.1.  INR 3.4.  Troponin was 9 chest x-ray showed multifocal pneumonia versus edema.  Patient required 4 L O2 via nasal cannula to improve sats to the mid 90s  Subjective: Patient is currently requiring 6 L of oxygen via nasal cannula.  Denies any fever.  Was lying on bed.  Objective: Vital signs in last 24 hours: Temp:  [98.1 F (36.7 C)] 98.1 F (36.7 C) (12/28 1918) Pulse Rate:  [93-130] 115 (12/29 1324) Resp:  [15-22] 19 (12/29 1324) BP: (133-171)/(66-104) 163/97 (12/29 1324) SpO2:  [96 %-100 %] 96 % (12/29 1324)  Intake/Output from previous day: 12/28 0701 - 12/29 0700 In: 250  Out: -  Intake/Output this shift: No intake/output data recorded.  Constitutional: NAD, alert and oriented x 3 Eyes: PERRL, lids and conjunctivae normal ENMT: Mucous membranes are moist. No pharyngeal injection Neck: normal, supple, no masses, no thyromegaly Respiratory: increased respiratory effort, wheezes and crackles bilateral lung fields.  Cardiovascular: Regular rate and rhythm, no murmurs / rubs /  gallops. No extremity edema. 2+ pedal pulses. No carotid bruits.  Abdomen: no tenderness, no masses palpated. No hepatosplenomegaly. Bowel sounds positive.  Musculoskeletal: no clubbing / cyanosis. No joint deformity upper and lower extremities.  Skin: no rashes, lesions, ulcers.  Neurologic: No gross focal neurologic deficit. Psychiatric: Normal mood and affect.  Results for orders placed or performed during the hospital encounter of 01/24/19 (from the past 24 hour(s))  C-reactive protein     Status: Abnormal   Collection Time: 01/26/19  4:10 AM  Result Value Ref Range   CRP 2.1 (H) <1.0 mg/dL  CBC with Differential/Platelet     Status: Abnormal   Collection Time: 01/26/19  4:10 AM  Result Value Ref Range   WBC 7.9 4.0 - 10.5 K/uL   RBC 3.33 (L) 4.22 - 5.81 MIL/uL   Hemoglobin 9.8 (L) 13.0 - 17.0 g/dL   HCT 29.2 (L) 39.0 - 52.0 %   MCV 87.7 80.0 - 100.0 fL   MCH 29.4 26.0 - 34.0 pg   MCHC 33.6 30.0 - 36.0 g/dL   RDW 15.1 11.5 - 15.5 %   Platelets 188 150 - 400 K/uL   nRBC 0.0 0.0 - 0.2 %   Neutrophils Relative % 80 %   Neutro Abs 6.4 1.7 - 7.7 K/uL   Lymphocytes Relative 11 %   Lymphs Abs 0.9 0.7 - 4.0 K/uL   Monocytes Relative 8 %   Monocytes Absolute 0.6 0.1 - 1.0 K/uL   Eosinophils Relative 0 %   Eosinophils Absolute 0.0 0.0 - 0.5 K/uL   Basophils  Relative 0 %   Basophils Absolute 0.0 0.0 - 0.1 K/uL   Immature Granulocytes 1 %   Abs Immature Granulocytes 0.04 0.00 - 0.07 K/uL  Comprehensive metabolic panel     Status: Abnormal   Collection Time: 01/26/19  4:10 AM  Result Value Ref Range   Sodium 139 135 - 145 mmol/L   Potassium 4.5 3.5 - 5.1 mmol/L   Chloride 106 98 - 111 mmol/L   CO2 25 22 - 32 mmol/L   Glucose, Bld 120 (H) 70 - 99 mg/dL   BUN 27 (H) 8 - 23 mg/dL   Creatinine, Ser 0.70 0.61 - 1.24 mg/dL   Calcium 8.0 (L) 8.9 - 10.3 mg/dL   Total Protein 5.9 (L) 6.5 - 8.1 g/dL   Albumin 2.3 (L) 3.5 - 5.0 g/dL   AST 19 15 - 41 U/L   ALT 16 0 - 44 U/L   Alkaline  Phosphatase 70 38 - 126 U/L   Total Bilirubin 0.5 0.3 - 1.2 mg/dL   GFR calc non Af Amer >60 >60 mL/min   GFR calc Af Amer >60 >60 mL/min   Anion gap 8 5 - 15  Magnesium     Status: None   Collection Time: 01/26/19  4:10 AM  Result Value Ref Range   Magnesium 2.2 1.7 - 2.4 mg/dL  Phosphorus     Status: None   Collection Time: 01/26/19  4:10 AM  Result Value Ref Range   Phosphorus 2.9 2.5 - 4.6 mg/dL  Ferritin     Status: None   Collection Time: 01/26/19  4:10 AM  Result Value Ref Range   Ferritin 98 24 - 336 ng/mL  Protime-INR     Status: Abnormal   Collection Time: 01/26/19  6:36 AM  Result Value Ref Range   Prothrombin Time 43.1 (H) 11.4 - 15.2 seconds   INR 4.5 (HH) 0.8 - 1.2  Protime-INR     Status: Abnormal   Collection Time: 01/26/19  9:56 AM  Result Value Ref Range   Prothrombin Time 42.7 (H) 11.4 - 15.2 seconds   INR 4.5 (HH) 0.8 - 1.2    Studies/Results: DG Chest 2 View  Result Date: 01/04/2019 CLINICAL DATA:  Increasing shortness of breath and cough. EXAM: CHEST - 2 VIEW COMPARISON:  CT chest 11/29/2015.  PA and lateral chest 02/01/2014. FINDINGS: The lungs are markedly emphysematous. There is patchy airspace disease in the left upper and lower lobes. Biapical scar is present. Small bilateral pleural effusions are seen. Heart size is normal. Aortic atherosclerosis. Remote L1 and L2 compression fractures are unchanged since the prior CT. The patient has a T7 compression fracture which is new since the prior CT. IMPRESSION: Patchy airspace disease in the left chest worrisome for pneumonia. Emphysema. Small bilateral pleural effusions. Atherosclerosis. Remote L1 and L2 compression fractures. T7 compression fracture is new since 2017 and appears remote. Electronically Signed   By: Inge Rise M.D.   On: 01/04/2019 11:47   DG Chest Portable 1 View  Result Date: 01/24/2019 CLINICAL DATA:  Shortness of breath, diagnosed with COVID-19 approximately 2 weeks ago, treated and  released from hospital, now very short of breath, hypoxic, tripod Ng in triage with retractions, history coronary artery disease, atrial fibrillation, hypertension, former smoker EXAM: PORTABLE CHEST 1 VIEW COMPARISON:  Portable exam 1818 hours compared to 01/04/2019 FINDINGS: Rotated to the LEFT. Normal heart size and mediastinal contours. Atherosclerotic calcification aorta. Emphysematous changes with significantly increased BILATERAL pulmonary infiltrates greatest at RIGHT  base. Bibasilar effusions. Findings could represent multifocal pneumonia or pulmonary edema. No pneumothorax. Bones demineralized. IMPRESSION: Significantly increased BILATERAL pulmonary infiltrates question multifocal pneumonia or pulmonary edema. BILATERAL pleural effusions and underlying COPD changes. Aortic Atherosclerosis (ICD10-I70.0). Electronically Signed   By: Lavonia Dana M.D.   On: 01/24/2019 18:57   ECHOCARDIOGRAM COMPLETE  Result Date: 01/07/2019   ECHOCARDIOGRAM REPORT   Patient Name:   GARNETT GOFF Date of Exam: 01/06/2019 Medical Rec #:  [id number]         Height:       69.0 in Accession #:    [id number]        Weight:       130.0 lb Date of Birth:  1928-08-10         BSA:          1.72 m Patient Age:    11 years          BP:           128/74 mmHg Patient Gender: M                 HR:           84 bpm. Exam Location:  [facility] Procedure: 2D Echo, Cardiac Doppler and Color Doppler Indications:     COVID-19                  R06.00 Dyspnea  History:         Patient has no prior history of Echocardiogram examinations.                  Risk Factors:Hypertension. Coronary artery disease.  Sonographer:     Wilford Sports Rodgers-Jones Referring Phys:  [place] Diagnosing Phys: Bartholome Bill MD IMPRESSIONS  1. Left ventricular ejection fraction, by visual estimation, is 55 to 60%. The left ventricle has normal function. Left ventricular septal wall thickness was mildly increased. Mildly increased left ventricular posterior wall  thickness. There is mildly increased left ventricular hypertrophy.  2. Left ventricular diastolic parameters are consistent with Grade I diastolic dysfunction (impaired relaxation).  3. Global right ventricle has normal systolic function.The right ventricular size is mildly enlarged. No increase in right ventricular wall thickness.  4. Left atrial size was mildly dilated.  5. Right atrial size was mildly dilated.  6. The mitral valve is grossly normal. Mild to moderate mitral valve regurgitation.  7. The tricuspid valve is grossly normal. Tricuspid valve regurgitation is trivial.  8. The aortic valve is abnormal. Aortic valve regurgitation is trivial. Mild to moderate aortic valve sclerosis/calcification without any evidence of aortic stenosis.  9. The pulmonic valve was not well visualized. Pulmonic valve regurgitation is trivial. 10. Moderately elevated pulmonary artery systolic pressure. 11. The atrial septum is grossly normal. FINDINGS  Left Ventricle: Left ventricular ejection fraction, by visual estimation, is 55 to 60%. The left ventricle has normal function. The left ventricle is not well visualized. Mildly increased left ventricular posterior wall thickness. There is mildly increased left ventricular hypertrophy. Left ventricular diastolic parameters are consistent with Grade I diastolic dysfunction (impaired relaxation). Right Ventricle: The right ventricular size is mildly enlarged. No increase in right ventricular wall thickness. Global RV systolic function is has normal systolic function. The tricuspid regurgitant velocity is 3.17 m/s, and with an assumed right atrial  pressure of 10 mmHg, the estimated right ventricular systolic pressure is moderately elevated at 50.3 mmHg. Left Atrium: Left atrial size was mildly dilated. Right Atrium: Right atrial size  was mildly dilated Pericardium: There is no evidence of pericardial effusion. There is a small pleural effusion. Mitral Valve: The mitral valve is  grossly normal. Mild to moderate mitral valve regurgitation. Tricuspid Valve: The tricuspid valve is grossly normal. Tricuspid valve regurgitation is trivial. Aortic Valve: The aortic valve is abnormal. Aortic valve regurgitation is trivial. Aortic regurgitation PHT measures 333 msec. Mild to moderate aortic valve sclerosis/calcification is present, without any evidence of aortic stenosis. Pulmonic Valve: The pulmonic valve was not well visualized. Pulmonic valve regurgitation is trivial. Pulmonic regurgitation is trivial. Aorta: The aortic root is normal in size and structure. IAS/Shunts: The atrial septum is grossly normal.  LEFT VENTRICLE PLAX 2D LVIDd:         4.29 cm LVIDs:         3.09 cm LV PW:         1.01 cm LV IVS:        0.83 cm LVOT diam:     2.00 cm LV SV:         45 ml LV SV Index:   26.70 LVOT Area:     3.14 cm  RIGHT VENTRICLE RV Basal diam:  4.84 cm RV S prime:     9.37 cm/s TAPSE (M-mode): 1.2 cm LEFT ATRIUM             Index       RIGHT ATRIUM           Index LA diam:        5.50 cm 3.20 cm/m  RA Area:     24.00 cm LA Vol (A2C):   80.7 ml 46.91 ml/m RA Volume:   86.20 ml  50.11 ml/m LA Vol (A4C):   70.7 ml 41.10 ml/m LA Biplane Vol: 76.9 ml 44.70 ml/m  AORTIC VALVE LVOT Vmax:   97.40 cm/s LVOT Vmean:  64.300 cm/s LVOT VTI:    0.184 m AI PHT:      333 msec  AORTA Ao Root diam: 3.30 cm MV E velocity: 101.17 cm/s 103 cm/s TRICUSPID VALVE                                     TR Peak grad:   40.3 mmHg                                     TR Vmax:        354.00 cm/s                                      SHUNTS                                     Systemic VTI:  0.18 m                                     Systemic Diam: 2.00 cm  Bartholome Bill MD Electronically signed by Bartholome Bill MD Signature Date/Time: 01/07/2019/9:32:50 AM    Final     Scheduled Meds: . albuterol  2 puff Inhalation Q6H  . vitamin C  500  mg Oral Daily  . atorvastatin  20 mg Oral Daily  . [START ON 01/27/2019] cefdinir  300 mg  Oral Q12H  . diltiazem  300 mg Oral Daily  . loratadine  10 mg Oral Daily  . methylPREDNISolone (SOLU-MEDROL) injection  0.25 mg/kg Intravenous Q12H  . metoprolol tartrate  50 mg Oral BID  . multivitamin with minerals  1 tablet Oral Daily  . Tiotropium Bromide Monohydrate  2 spray Inhalation Daily  . Warfarin - Pharmacist Dosing Inpatient   Does not apply q1800  . zinc sulfate  220 mg Oral Daily   Continuous Infusions: . azithromycin Stopped (01/25/19 2259)  . cefTRIAXone (ROCEPHIN)  IV Stopped (01/25/19 2140)   PRN Meds:bisacodyl, guaiFENesin-dextromethorphan, traZODone  Assessment/Plan: Acute respiratory failure with hypoxia (HCC) related to viral pneumonia/COVID-19: -Currently on 6 L L of supplemental oxygen via nasal cannula --Patient was recently hospitalized with COVID-19 pneumonia but has persistent symptoms and worsening shortness of breath. --Continue supplemental oxygen to keep sats over 90% -Wean down as possible to room air -Bronchodilator --Repeat remdesivir not indicated at this time --Dexamethasone IV x1; now on Solu-Medrol; continue and vitamins --Procalcitonin was less than 0.10 but patient was started on  empiric antibiotic therapy from admission -We will continue Rocephin and azithromycin IV -Monitor inflammatory markers  Supratherapeutic INR --Hold Coumadin for now --Daily PT/INR -Pharmacy consulted for Coumadin management    Atrial fibrillation, chronic ([place]): Currently having RVR in 110s to 120s --Slowly decreasing rate  -Was holding metoprolol and diltiazem admission to low blood pressure; resumed both home dose of metoprolol and diltiazem --Holding Coumadin due to INR of 3.4 -Consulted pharmacy for management; appreciated  Essential hypertension: Stable --Continue metoprolol and diltiazem;     Coronary artery disease --No complaints of chest pain.  EKG showed A. fib but no acute ST-T wave changes, troponin negative --Continue statin, aspirin  and beta-blocker  Electrolyte imbalance: Found to be hypokalemic and hypomagnesemic -Status post replacement -We will check daily   LOS: 2 days   Jackson Fetters Izetta Dakin

## 2019-01-27 ENCOUNTER — Encounter: Payer: Self-pay | Admitting: Family Medicine

## 2019-01-27 DIAGNOSIS — J1289 Other viral pneumonia: Secondary | ICD-10-CM

## 2019-01-27 DIAGNOSIS — U071 COVID-19: Secondary | ICD-10-CM

## 2019-01-27 DIAGNOSIS — I482 Chronic atrial fibrillation, unspecified: Secondary | ICD-10-CM

## 2019-01-27 DIAGNOSIS — I5032 Chronic diastolic (congestive) heart failure: Secondary | ICD-10-CM | POA: Insufficient documentation

## 2019-01-27 DIAGNOSIS — I2510 Atherosclerotic heart disease of native coronary artery without angina pectoris: Secondary | ICD-10-CM

## 2019-01-27 DIAGNOSIS — I5033 Acute on chronic diastolic (congestive) heart failure: Secondary | ICD-10-CM | POA: Diagnosis present

## 2019-01-27 DIAGNOSIS — I10 Essential (primary) hypertension: Secondary | ICD-10-CM

## 2019-01-27 LAB — CBC WITH DIFFERENTIAL/PLATELET
Abs Immature Granulocytes: 0.06 10*3/uL (ref 0.00–0.07)
Basophils Absolute: 0 10*3/uL (ref 0.0–0.1)
Basophils Relative: 0 %
Eosinophils Absolute: 0 10*3/uL (ref 0.0–0.5)
Eosinophils Relative: 0 %
HCT: 31.7 % — ABNORMAL LOW (ref 39.0–52.0)
Hemoglobin: 10.3 g/dL — ABNORMAL LOW (ref 13.0–17.0)
Immature Granulocytes: 1 %
Lymphocytes Relative: 11 %
Lymphs Abs: 1.1 10*3/uL (ref 0.7–4.0)
MCH: 29.3 pg (ref 26.0–34.0)
MCHC: 32.5 g/dL (ref 30.0–36.0)
MCV: 90.3 fL (ref 80.0–100.0)
Monocytes Absolute: 0.7 10*3/uL (ref 0.1–1.0)
Monocytes Relative: 7 %
Neutro Abs: 8.5 10*3/uL — ABNORMAL HIGH (ref 1.7–7.7)
Neutrophils Relative %: 81 %
Platelets: 213 10*3/uL (ref 150–400)
RBC: 3.51 MIL/uL — ABNORMAL LOW (ref 4.22–5.81)
RDW: 15.4 % (ref 11.5–15.5)
WBC: 10.4 10*3/uL (ref 4.0–10.5)
nRBC: 0 % (ref 0.0–0.2)

## 2019-01-27 LAB — COMPREHENSIVE METABOLIC PANEL
ALT: 20 U/L (ref 0–44)
AST: 23 U/L (ref 15–41)
Albumin: 2.4 g/dL — ABNORMAL LOW (ref 3.5–5.0)
Alkaline Phosphatase: 70 U/L (ref 38–126)
Anion gap: 7 (ref 5–15)
BUN: 28 mg/dL — ABNORMAL HIGH (ref 8–23)
CO2: 30 mmol/L (ref 22–32)
Calcium: 8.3 mg/dL — ABNORMAL LOW (ref 8.9–10.3)
Chloride: 102 mmol/L (ref 98–111)
Creatinine, Ser: 0.67 mg/dL (ref 0.61–1.24)
GFR calc Af Amer: >60 mL/min (ref >60)
GFR calc non Af Amer: >60 mL/min (ref >60)
Glucose, Bld: 111 mg/dL — ABNORMAL HIGH (ref 70–99)
Potassium: 4.9 mmol/L (ref 3.5–5.1)
Sodium: 139 mmol/L (ref 135–145)
Total Bilirubin: 0.5 mg/dL (ref 0.3–1.2)
Total Protein: 6.3 g/dL — ABNORMAL LOW (ref 6.5–8.1)

## 2019-01-27 LAB — C-REACTIVE PROTEIN: CRP: 1.1 mg/dL — ABNORMAL HIGH (ref <1.0)

## 2019-01-27 LAB — PHOSPHORUS: Phosphorus: 2.9 mg/dL (ref 2.5–4.6)

## 2019-01-27 LAB — FERRITIN: Ferritin: 93 ng/mL (ref 24–336)

## 2019-01-27 LAB — MAGNESIUM: Magnesium: 2.2 mg/dL (ref 1.7–2.4)

## 2019-01-27 LAB — LEGIONELLA PNEUMOPHILA SEROGP 1 UR AG: L. pneumophila Serogp 1 Ur Ag: NEGATIVE

## 2019-01-27 LAB — PROTIME-INR
INR: 4.8 (ref 0.8–1.2)
Prothrombin Time: 45.1 seconds — ABNORMAL HIGH (ref 11.4–15.2)

## 2019-01-27 MED ORDER — POTASSIUM CHLORIDE CRYS ER 20 MEQ PO TBCR
20.0000 meq | EXTENDED_RELEASE_TABLET | Freq: Two times a day (BID) | ORAL | Status: DC
  Administered 2019-01-27 – 2019-01-28 (×4): 20 meq via ORAL
  Filled 2019-01-27: qty 2
  Filled 2019-01-27 (×3): qty 1

## 2019-01-27 MED ORDER — FUROSEMIDE 10 MG/ML IJ SOLN
40.0000 mg | Freq: Two times a day (BID) | INTRAMUSCULAR | Status: DC
  Administered 2019-01-27 (×2): 40 mg via INTRAVENOUS
  Filled 2019-01-27 (×2): qty 4

## 2019-01-27 NOTE — Progress Notes (Signed)
PROGRESS NOTE    RORKE PAGLIARULO  [id number] DOB: 1928/05/21 DOA: 01/24/2019 PCP: Baxter Hire, MD      Brief Narrative:  Mr. [name] is a 85 y.o. M with CAD, HTN, A. fib on warfarin, and recent COVID-19 admission 12/7-12/11 who presents with shortness of breath and cough for 1 day.  Patient had been home from the hospital, on new supplemental oxygen after COVID-19 hospitalization.  He is been doing fairly well, some shortness of breath with ambulation and cough although managing until the day of admission when he became severely short of breath, tripoding, having difficulty to breathe.  In the ER, patient was tachypneic and tachycardic.  Afebrile, SPO2 75% on room air.  Chest x-ray showed bilateral opacities.  Empiric antibiotics.      Assessment & Plan:  Acute on chronic diastolic CHF -Furosemide 40 mg IV twice a day  -K supplement -Strict I/Os, daily weights, telemetry  -Daily monitoring renal function      Atrial fibrillation, paroxysmal Rate controlled -Continue metoprolol -Restart warfarin when INR improves  Supratherapeutic INR Clinical bleeding -Hold warfarin -Daily INR  Coronary disease, secondary prevention Hypertension Blood pressure elevated -Continue atorvastatin  Pneumonia ruled out Pro calcitonin nondetectable, no leukocytosis. No improvement with antibiotics -Stop antibiotics  Anemia No clinical bleeding   COPD -Continue bronchodilators      MDM and disposition: The below labs and imaging reports were reviewed and summarized above.  Medication management as above.  The patient was admitted with acute hypoxic respiratory failure, he has not responded to antibiotics, and is procalcitonin is low and BNP is very elevated.  He is not particularly fluid overloaded on exam, but I suspect that he has CHF flare.    We will start diuresis, attempt to wean oxygen, monitor his renal function very closely.       Barriers to  discharge: Clinical improvement PT evaluation, skilled nursing placement       DVT prophylaxis: Not applicable, on warfarin Code Status: DNR Family Communication:     Consultants:     Procedures:     Antimicrobials:       Subjective: Patient is still feeling dyspneic, but somewhat improved since Lasix this morning.  No fever, sputum, confusion.  Objective: Vitals:   01/27/19 0751 01/27/19 1253 01/27/19 1628 01/27/19 1638  BP: (!) 142/82 139/77 (!) 96/46 118/69  Pulse: 76 68 84   Resp: 19 18 18    Temp:  98.4 F (36.9 C) (!) 97.3 F (36.3 C)   TempSrc:   Oral   SpO2: 96% 100% 90%   Weight:      Height:        Intake/Output Summary (Last 24 hours) at 01/27/2019 1812 Last data filed at 01/27/2019 1757 Gross per 24 hour  Intake 240 ml  Output 1700 ml  Net -1460 ml   Filed Weights   01/24/19 1803  Weight: 58.1 kg    Examination: General appearance: Elderly adult male, alert and in no acute distress.   HEENT: Anicteric, conjunctiva pink, lids and lashes normal. No nasal deformity, discharge, epistaxis.  Lips moist, dentition normal, no oral lesions, hearing somewhat diminished.   Skin: Warm and dry.  No jaundice.  No suspicious rashes or lesions. Cardiac: RRR, nl S1-S2, no murmurs appreciated.  Capillary refill is brisk.  JVP not visible.  Trace LE edema.  Radial  pulses 2+ and symmetric. Respiratory: Normal respiratory rate and rhythm.  CTAB without rales or wheezes.  Diminished, respirations shallow, kyphosis. Abdomen:  Abdomen soft.  No TTP or guarding. No ascites, distension, hepatosplenomegaly.   MSK: No deformities or effusions. Neuro: Awake and alert.  EOMI, moves all extremities. Speech fluent.    Psych: Sensorium intact and responding to questions, attention normal. Affect normal.  Judgment and insight appear normal.    Data Reviewed: I have personally reviewed following labs and imaging studies:  CBC: Recent Labs  Lab 01/24/19 2027  01/25/19 0326 01/26/19 0410 01/27/19 0600  WBC 8.7 7.9 7.9 10.4  NEUTROABS 6.3 7.1 6.4 8.5*  HGB 10.2* 11.2* 9.8* 10.3*  HCT 31.8* 33.4* 29.2* 31.7*  MCV 91.1 86.5 87.7 90.3  PLT 209 210 188 [id number]   Basic Metabolic Panel: Recent Labs  Lab 01/24/19 2027 01/25/19 0326 01/26/19 0410 01/27/19 0600  NA 140 139 139 139  K 3.7 3.2* 4.5 4.9  CL 103 102 106 102  CO2 27 25 25 30   GLUCOSE 110* 123* 120* 111*  BUN 23 20 27* 28*  CREATININE 0.79 0.60* 0.70 0.67  CALCIUM 8.1* 7.9* 8.0* 8.3*  MG  --  1.9 2.2 2.2  PHOS  --  3.3 2.9 2.9   GFR: Estimated Creatinine Clearance: 52.5 mL/min (by C-G formula based on SCr of 0.67 mg/dL). Liver Function Tests: Recent Labs  Lab 01/25/19 0326 01/26/19 0410 01/27/19 0600  AST 23 19 23   ALT 17 16 20   ALKPHOS 76 70 70  BILITOT 0.7 0.5 0.5  PROT 6.4* 5.9* 6.3*  ALBUMIN 2.6* 2.3* 2.4*   No results for input(s): LIPASE, AMYLASE in the last 168 hours. No results for input(s): AMMONIA in the last 168 hours. Coagulation Profile: Recent Labs  Lab 01/24/19 2027 01/25/19 0326 01/26/19 0636 01/26/19 0956 01/27/19 0600  INR 3.4* 2.6* 4.5* 4.5* 4.8*   Cardiac Enzymes: No results for input(s): CKTOTAL, CKMB, CKMBINDEX, TROPONINI in the last 168 hours. BNP (last 3 results) No results for input(s): PROBNP in the last 8760 hours. HbA1C: No results for input(s): HGBA1C in the last 72 hours. CBG: No results for input(s): GLUCAP in the last 168 hours. Lipid Profile: No results for input(s): CHOL, HDL, LDLCALC, TRIG, CHOLHDL, LDLDIRECT in the last 72 hours. Thyroid Function Tests: No results for input(s): TSH, T4TOTAL, FREET4, T3FREE, THYROIDAB in the last 72 hours. Anemia Panel: Recent Labs    01/26/19 0410 01/27/19 0600  FERRITIN 98 93   Urine analysis:    Component Value Date/Time   COLORURINE YELLOW (A) 01/04/2019 1206   APPEARANCEUR CLEAR (A) 01/04/2019 1206   LABSPEC 1.018 01/04/2019 1206   PHURINE 6.0 01/04/2019 1206   GLUCOSEU  NEGATIVE 01/04/2019 1206   HGBUR NEGATIVE 01/04/2019 1206   BILIRUBINUR NEGATIVE 01/04/2019 1206   KETONESUR 5 (A) 01/04/2019 1206   PROTEINUR 100 (A) 01/04/2019 1206   NITRITE NEGATIVE 01/04/2019 1206   LEUKOCYTESUR NEGATIVE 01/04/2019 1206   Sepsis Labs: @LABRCNTIP (procalcitonin:4,lacticacidven:4)  ) Recent Results (from the past 240 hour(s))  Culture, blood (Routine X 2) w Reflex to ID Panel     Status: None (Preliminary result)   Collection Time: 01/24/19 10:07 PM   Specimen: BLOOD  Result Value Ref Range Status   Specimen Description BLOOD RIGHT FOREARM  Final   Special Requests   Final    BOTTLES DRAWN AEROBIC AND ANAEROBIC Blood Culture adequate volume   Culture   Final    NO GROWTH 2 DAYS Performed at [facility], [address], [place], [place] [address]    Report Status PENDING  Incomplete  Culture, blood (Routine X  2) w Reflex to ID Panel     Status: None (Preliminary result)   Collection Time: 01/24/19 10:07 PM   Specimen: BLOOD  Result Value Ref Range Status   Specimen Description BLOOD RIGHT ANTECUBITAL  Final   Special Requests   Final    BOTTLES DRAWN AEROBIC AND ANAEROBIC Blood Culture adequate volume   Culture   Final    NO GROWTH 2 DAYS Performed at [facility], [address] [place], [place] [address]    Report Status PENDING  Incomplete  Respiratory Panel by PCR     Status: None   Collection Time: 01/25/19  3:26 AM   Specimen: Nasopharyngeal Swab; Respiratory  Result Value Ref Range Status   Adenovirus NOT DETECTED NOT DETECTED Final   Coronavirus 229E NOT DETECTED NOT DETECTED Final    Comment: (NOTE) The Coronavirus on the Respiratory Panel, DOES NOT test for the novel  Coronavirus (2019 nCoV)    Coronavirus HKU1 NOT DETECTED NOT DETECTED Final   Coronavirus NL63 NOT DETECTED NOT DETECTED Final   Coronavirus OC43 NOT DETECTED NOT DETECTED Final   Metapneumovirus NOT DETECTED NOT DETECTED Final   Rhinovirus /  Enterovirus NOT DETECTED NOT DETECTED Final   Influenza A NOT DETECTED NOT DETECTED Final   Influenza B NOT DETECTED NOT DETECTED Final   Parainfluenza Virus 1 NOT DETECTED NOT DETECTED Final   Parainfluenza Virus 2 NOT DETECTED NOT DETECTED Final   Parainfluenza Virus 3 NOT DETECTED NOT DETECTED Final   Parainfluenza Virus 4 NOT DETECTED NOT DETECTED Final   Respiratory Syncytial Virus NOT DETECTED NOT DETECTED Final   Bordetella pertussis NOT DETECTED NOT DETECTED Final   Chlamydophila pneumoniae NOT DETECTED NOT DETECTED Final   Mycoplasma pneumoniae NOT DETECTED NOT DETECTED Final    Comment: Performed at [facility], [place] [address], [place], [place] [address]  Culture, sputum-assessment     Status: None   Collection Time: 01/25/19  9:57 AM   Specimen: Expectorated Sputum  Result Value Ref Range Status   Specimen Description EXPECTORATED SPUTUM  Final   Special Requests NONE  Final   Sputum evaluation   Final    Sputum specimen not acceptable for testing.  Please recollect.   Performed at [facility], [address] [place], [place] [address]    Report Status 01/25/2019 FINAL  Final         Radiology Studies: No results found.      Scheduled Meds: . albuterol  2 puff Inhalation Q6H  . vitamin C  500 mg Oral Daily  . atorvastatin  20 mg Oral Daily  . cefdinir  300 mg Oral Q12H  . diltiazem  300 mg Oral Daily  . furosemide  40 mg Intravenous BID  . loratadine  10 mg Oral Daily  . metoprolol tartrate  50 mg Oral BID  . multivitamin with minerals  1 tablet Oral Daily  . potassium chloride  20 mEq Oral BID  . tiotropium  18 mcg Inhalation Daily  . Warfarin - Pharmacist Dosing Inpatient   Does not apply q1800  . zinc sulfate  220 mg Oral Daily   Continuous Infusions:   LOS: 3 days    Time spent: 25 minutes    Edwin Dada, MD [facility] 01/27/2019, 6:12 PM     Please page though [place] or Epic secure chat:  For [employer], Adult nurse

## 2019-01-27 NOTE — ED Notes (Signed)
Breakfast order placed for pt. No further needs at this time.

## 2019-01-27 NOTE — ED Notes (Signed)
Pt given breakfast tray

## 2019-01-27 NOTE — Plan of Care (Signed)
  Problem: Education: Goal: Knowledge of General Education information will improve Description Including pain rating scale, medication(s)/side effects and non-pharmacologic comfort measures Outcome: Progressing   

## 2019-01-27 NOTE — Consult Note (Signed)
[place] for Warfarin  Indication: atrial fibrillation  No Known Allergies  Patient Measurements: Height: 5\' 10"  (177.8 cm) Weight: 128 lb (58.1 kg) IBW/kg (Calculated) : 73   Vital Signs: BP: 142/82 (12/30 0751) Pulse Rate: 76 (12/30 0751)  Labs: Recent Labs    01/24/19 2027 01/25/19 0326 01/26/19 0410 01/26/19 0636 01/26/19 0956 01/27/19 0600  HGB 10.2* 11.2* 9.8*  --   --  10.3*  HCT 31.8* 33.4* 29.2*  --   --  31.7*  PLT 209 210 188  --   --  213  LABPROT 34.0* 28.1*  --  43.1* 42.7* 45.1*  INR 3.4* 2.6*  --  4.5* 4.5* 4.8*  CREATININE 0.79 0.60* 0.70  --   --  0.67  TROPONINIHS 9 8  --   --   --   --     Estimated Creatinine Clearance: 52.5 mL/min (by C-G formula based on SCr of 0.67 mg/dL).   Medications:  Per chart review:  Warfarin 2 mg Sat & Sun  Warfarin 3 mg Mon through Fri  Per patient, last warfarin dose was 2 mg on 12/24 or 12/25 as a result of being told to hold warfarin d/t INR being elevated. He was told to resume 2 mg and was scheduled to have his INR rechecked today.   Assessment: Pharmacy has been consulted for Warfarin dosing in patient presenting with COVID-19 who has a PMH significant for atrial fibrillation. Patient was recently discharged on 12/14 with an elevated INR and discharge instructions indicated for the patient to HOLD warfarin for 2 days and f/u with his PCP. Per chart Care Everywhere, on 12/22 INR was 4.8; patient was instructed to HOLD warfarin 12/23 and 12/24 then resume 2 mg daily and f/u INR on 12/28.  Drug interactions: azithromycin - may increase INR. Azithromycin stopped 01/26/19 Patient is now on Cefdinir (may increase INR).  Today's INR: Supratherapeutic. Per nurse, there are no bleeding concerns today.   Hgb 11.2 > 9.8  Date INR  Dose  12/27 3.4 ----- 12/28 2.6 1.5 mg  12/29 4.5 HELD   12/30 4.8    Goal of Therapy:  INR 2-3 Monitor platelets by anticoagulation protocol:  Yes   Plan:  1. Will HOLD warfarin for today. Will recheck INR today for confirmatory INR. MD made aware of INR and plan. Will check INR with AM labs.    Rowland Lathe 01/27/2019,10:36 AM

## 2019-01-27 NOTE — Progress Notes (Signed)
Patient has a pending referral with [place] [facility] from last admission. Appointment with the NP is on 1/5. CSW Evette Cristal made aware. Flo Shanks BSN, RN, [place] [phone]

## 2019-01-28 LAB — CBC
HCT: 33.2 % — ABNORMAL LOW (ref 39.0–52.0)
Hemoglobin: 11.1 g/dL — ABNORMAL LOW (ref 13.0–17.0)
MCH: 29.8 pg (ref 26.0–34.0)
MCHC: 33.4 g/dL (ref 30.0–36.0)
MCV: 89.2 fL (ref 80.0–100.0)
Platelets: 238 10*3/uL (ref 150–400)
RBC: 3.72 MIL/uL — ABNORMAL LOW (ref 4.22–5.81)
RDW: 15.1 % (ref 11.5–15.5)
WBC: 10.2 10*3/uL (ref 4.0–10.5)
nRBC: 0 % (ref 0.0–0.2)

## 2019-01-28 LAB — COMPREHENSIVE METABOLIC PANEL
ALT: 24 U/L (ref 0–44)
AST: 25 U/L (ref 15–41)
Albumin: 2.7 g/dL — ABNORMAL LOW (ref 3.5–5.0)
Alkaline Phosphatase: 82 U/L (ref 38–126)
Anion gap: 10 (ref 5–15)
BUN: 29 mg/dL — ABNORMAL HIGH (ref 8–23)
CO2: 33 mmol/L — ABNORMAL HIGH (ref 22–32)
Calcium: 8.6 mg/dL — ABNORMAL LOW (ref 8.9–10.3)
Chloride: 95 mmol/L — ABNORMAL LOW (ref 98–111)
Creatinine, Ser: 0.76 mg/dL (ref 0.61–1.24)
GFR calc Af Amer: >60 mL/min (ref >60)
GFR calc non Af Amer: >60 mL/min (ref >60)
Glucose, Bld: 85 mg/dL (ref 70–99)
Potassium: 4.3 mmol/L (ref 3.5–5.1)
Sodium: 138 mmol/L (ref 135–145)
Total Bilirubin: 0.7 mg/dL (ref 0.3–1.2)
Total Protein: 6.4 g/dL — ABNORMAL LOW (ref 6.5–8.1)

## 2019-01-28 LAB — PROTIME-INR
INR: 3.4 — ABNORMAL HIGH (ref 0.8–1.2)
Prothrombin Time: 34 seconds — ABNORMAL HIGH (ref 11.4–15.2)

## 2019-01-28 MED ORDER — IPRATROPIUM-ALBUTEROL 0.5-2.5 (3) MG/3ML IN SOLN
3.0000 mL | Freq: Four times a day (QID) | RESPIRATORY_TRACT | Status: DC
  Administered 2019-01-28 (×2): 3 mL via RESPIRATORY_TRACT
  Filled 2019-01-28 (×2): qty 3

## 2019-01-28 MED ORDER — WARFARIN 0.5 MG HALF TABLET
0.5000 mg | ORAL_TABLET | Freq: Once | ORAL | Status: AC
  Administered 2019-01-28: 18:00:00 0.5 mg via ORAL
  Filled 2019-01-28: qty 1

## 2019-01-28 MED ORDER — METHYLPREDNISOLONE SODIUM SUCC 125 MG IJ SOLR
60.0000 mg | INTRAMUSCULAR | Status: DC
  Administered 2019-01-28 – 2019-01-31 (×4): 60 mg via INTRAVENOUS
  Filled 2019-01-28 (×4): qty 2

## 2019-01-28 MED ORDER — ALBUTEROL SULFATE (2.5 MG/3ML) 0.083% IN NEBU: 2.5000 mg | INHALATION_SOLUTION | RESPIRATORY_TRACT | Status: DC | PRN

## 2019-01-28 MED ORDER — ALBUTEROL SULFATE (2.5 MG/3ML) 0.083% IN NEBU
2.5000 mg | INHALATION_SOLUTION | Freq: Four times a day (QID) | RESPIRATORY_TRACT | Status: DC
  Administered 2019-01-28: 11:00:00 2.5 mg via RESPIRATORY_TRACT
  Filled 2019-01-28: qty 3

## 2019-01-28 NOTE — Evaluation (Signed)
Physical Therapy Evaluation Patient Details Name: [name] MRN: [id number] DOB: 08-07-1928 Today's Date: 01/28/2019   History of Present Illness  85 y.o. M with CAD, HTN, A. fib on warfarin, and recent COVID-19 admission 12/7-12/11 who presents with shortness of breath and cough for 1 day.  He had improved with prednisone taper and did not need O2 initially but is now returning with SOB and fatigue.   Clinical Impression  Pt did well with PT exam and overall is feeling better than he did on arrival  However, he is still requiring 4L O2 (trail of dropping it down to 2L resulted in quick drop in sats to 80s) and his resting HR was over 100 with increase during activity to as high as 130s though generally staying 110s with ambulation.  He was safe and showed good confidence with ambulation into the hallway with [facility], reports he does not use it all the time but has been recently since recent d/c.  Pt would benefit from HHPT on d/c though he feels that if he improves and is able to ambulate further and with less O2 while here he may not need it.     Follow Up Recommendations Home health PT;Supervision - Intermittent    Equipment Recommendations  None recommended by PT    Recommendations for Other Services       Precautions / Restrictions Precautions Precautions: Fall Restrictions Weight Bearing Restrictions: No      Mobility  Bed Mobility Overal bed mobility: Independent             General bed mobility comments: Pt able to easily get to EOB w/o assist  Transfers Overall transfer level: Modified independent Equipment used: Straight cane             General transfer comment: Pt able to safely and confidently getting to standing with single [facility]  Ambulation/Gait Ambulation/Gait assistance: Supervision Gait Distance (Feet): 100 Feet Assistive device: Straight cane       General Gait Details: Pt did well with ambulation and showed good confidence and strength.   Pt showed good effort and tolerance with O2 staying in the 90s on 4L, HR fluctuating from 110s to as high as briefly in the 130s.   Stairs            Wheelchair Mobility    Modified Rankin (Stroke Patients Only)       Balance Overall balance assessment: Independent                                           Pertinent Vitals/Pain Pain Assessment: No/denies pain    Home Living Family/patient expects to be discharged to:: Private residence Living Arrangements: Alone Available Help at Discharge: Available PRN/intermittently(daughter works from his home, alone during night )   Home Access: [place]: One [place]: Environmental consultant - 2 wheels;Cane - single point      Prior Function Level of Independence: Independent         Comments: Pt reports he uses SPC sometimes, typically nothing in the home.       Hand Dominance        Extremity/Trunk Assessment   Upper Extremity Assessment Upper Extremity Assessment: Overall WFL for tasks assessed    Lower Extremity Assessment Lower Extremity Assessment: Overall WFL for tasks assessed  Communication   Communication: No difficulties  Cognition Arousal/Alertness: Awake/alert Behavior During Therapy: WFL for tasks assessed/performed Overall Cognitive Status: Within Functional Limits for tasks assessed                                        General Comments      Exercises     Assessment/Plan    PT Assessment Patient needs continued PT services  PT Problem List Decreased activity tolerance;Decreased balance;Decreased mobility;Decreased knowledge of use of DME;Decreased safety awareness;Cardiopulmonary status limiting activity       PT Treatment Interventions DME instruction;Stair training;Gait training;Functional mobility training;Therapeutic activities;Therapeutic exercise;Balance training;Cognitive remediation;Patient/family education    PT  Goals (Current goals can be found in the Care Plan section)  Acute Rehab PT Goals Patient Stated Goal: Go home PT Goal Formulation: With patient Time For Goal Achievement: 02/11/19 Potential to Achieve Goals: Good    Frequency Min 2X/week   Barriers to discharge        Co-evaluation               AM-PAC PT "6 Clicks" Mobility  Outcome Measure Help needed turning from your back to your side while in a flat bed without using bedrails?: None Help needed moving from lying on your back to sitting on the side of a flat bed without using bedrails?: None Help needed moving to and from a bed to a chair (including a wheelchair)?: None Help needed standing up from a chair using your arms (e.g., wheelchair or bedside chair)?: None Help needed to walk in hospital room?: None Help needed climbing 3-5 steps with a railing? : A Little 6 Click Score: 23    End of Session Equipment Utilized During Treatment: Gait belt;Oxygen(4L O2, trial dropping down to 2 L with sats dropping to 80s) Activity Tolerance: Patient tolerated treatment well;Patient limited by fatigue Patient left: with call bell/phone within reach;in chair;with nursing/sitter in room Nurse Communication: Mobility status(vitals with ambulation) PT Visit Diagnosis: Muscle weakness (generalized) (M62.81);Difficulty in walking, not elsewhere classified (R26.2)    Time: [id number] PT Time Calculation (min) (ACUTE ONLY): 29 min   Charges:   PT Evaluation $PT Eval Low Complexity: 1 Low PT Treatments $Gait Training: 8-22 mins        Kreg Shropshire, DPT 01/28/2019, 11:44 AM

## 2019-01-28 NOTE — Progress Notes (Signed)
Patient resting in bed.  O2 sat checked on RA at rest and found to be 74%.  Placed back on 4L and oxygen level up to 94%.

## 2019-01-28 NOTE — Consult Note (Addendum)
[place] for Warfarin  Indication: atrial fibrillation  No Known Allergies  Patient Measurements: Height: 5\' 10"  (177.8 cm) Weight: 132 lb (59.9 kg) IBW/kg (Calculated) : 73   Vital Signs: Temp: 97.8 F (36.6 C) (12/31 0545) Temp Source: Oral (12/31 0545) BP: 93/47 (12/31 0547) Pulse Rate: 73 (12/31 0547)  Labs: Recent Labs    01/26/19 0410 01/26/19 0956 01/27/19 0600 01/28/19 0424  HGB 9.8*  --  10.3* 11.1*  HCT 29.2*  --  31.7* 33.2*  PLT 188  --  213 238  LABPROT  --  42.7* 45.1* 34.0*  INR  --  4.5* 4.8* 3.4*  CREATININE 0.70  --  0.67 0.76    Estimated Creatinine Clearance: 54.1 mL/min (by C-G formula based on SCr of 0.76 mg/dL).   Medications:  Per chart review:  Warfarin 2 mg Sat & Sun  Warfarin 3 mg Mon through Fri  Per patient, last warfarin dose was 2 mg on 12/24 or 12/25 as a result of being told to hold warfarin d/t INR being elevated. He was told to resume 2 mg and was scheduled to have his INR rechecked today.   Assessment: Pharmacy has been consulted for Warfarin dosing in patient presenting with COVID-19 who has a PMH significant for atrial fibrillation. Patient was recently discharged on 12/14 with an elevated INR and discharge instructions indicated for the patient to HOLD warfarin for 2 days and f/u with his PCP.   Per chart Care Everywhere, on 12/22 INR was 4.8; patient was instructed to HOLD warfarin 12/23 and 12/24 then resume 2 mg daily and f/u INR on 12/28.  Previous drug interactions:   12/29 - azithromycin (may have increased INR) 12/30 - cefdinir (also may increase INR)  HGB/PLT stable  Today's INR: Supratherapeutic  Date INR  Dose  12/27 3.4 held 12/28 2.6       1.5mg  12/29   4.5       held 12/30   4.8       held 12/31   3.4  Goal of Therapy:  INR 2-3 Monitor platelets by anticoagulation protocol: Yes   Plan:  INR still supra-therapeutic - will dose 0.5mg  this evening and possibly  resume 1mg  qd thereafter while on antibiotics.   Will check INR daily and CBC every 3 days.   Lu Duffel, PharmD, BCPS Clinical Pharmacist 01/28/2019 7:52 AM

## 2019-01-28 NOTE — Progress Notes (Signed)
PROGRESS NOTE    RODDY LAIDIG  [id number] DOB: December 23, 1928 DOA: 01/24/2019 PCP: Baxter Hire, MD      Brief Narrative:  Mr. Schuth is a 85 y.o. M with CAD, HTN, A. fib on warfarin, and recent COVID-19 admission 12/7-12/11 who presents with shortness of breath and cough for 1 day.  Patient was weaned from O2 before discharge, had been home from the hospital, with prednisone taper, SpO2 in 90s for about a week, until finished prednisone taper, been doing fairly well, some shortness of breath with ambulation and cough although managing until a few days prior to admission when O2 sats were lower, then the day of admission when he became severely short of breath, tripoding, having difficulty to breathe.  In the ER, patient was tachypneic and tachycardic.  Afebrile, SPO2 75% on room air.  Chest x-ray showed bilateral opacities. Started on empiric antibiotics.         Assessment & Plan:  Post-COVID acute hypoxic respiratory failure Patient several weeks from initial SARS infection, was weaned to room air in hospital, discharged off O2, but worsened with prednisone taper.  On return here, was started empirically on antibiotics and didn't improve. Given diuresis and didn't wean O2.    Given his improvement with steroids at hospital discharge, I will start Solu-medrol and attempt to wean O2 and maximize pulmonary toilet. -Start Solu-medrol 60 daily -Turn/cough/IS/flutter   Acute on chronic diastolic CHF Mild, treated with Lasix x1 day, now no swelling on exam. -Hold Lasix    Atrial fibrillation, paroxysmal Rate controlled -Continue metoprolol -Restart warfarin    Supratherapeutic INR Without clinical bleeding, INR trending down -Resume warfarin -Daily INR  Coronary disease, secondary prevention Hypertension Blood pressure labile -Continue metoprolol -Continue atorvastatin  Pneumonia ruled out  Pro calcitonin nondetectable, no leukocytosis. No improvement  with antibiotics -Stop antibiotics  Anemia Stable, no clinical bleeding   COPD -Continue bronchodilators      MDM and disposition: The below labs and imaging reports reviewed and summarized above.  Medication management as above.   The patient was admitted with acute hypoxic respiratory failure, he has not responded to antibiotics or diuresis.    He still remains on 4L O2, which is a new development since discharge from the [facility].   Given his prior response to steroids, I will order solumedrol and monitor closely, attempt to wean O2.       Barriers to discharge: Need to see response to steroid Likely to home, will need HH      DVT prophylaxis: Not applicable, on warfarin Code Status: DNR Family Communication: Daughter on the phone    Consultants:     Procedures:     Antimicrobials:   Azithromycin 12/27>>12/29  Ceftriaxone 12/27>> 12/29 and cefdinir 12/30   Subjective: Patient is dyspneic with exertion, but feels comfortable at rest.  Still very weak.  No confusion, vomiting.  No sputum, fever.  Objective: Vitals:   01/27/19 1941 01/28/19 0545 01/28/19 0547 01/28/19 0804  BP: (!) 112/56 (!) 133/100 (!) 93/47 (!) 144/78  Pulse: 71 (!) 107 73 89  Resp: 19 18  19   Temp: 98.3 F (36.8 C) 97.8 F (36.6 C)  (!) 97.5 F (36.4 C)  TempSrc: Oral Oral  Oral  SpO2: 96% 95%  98%  Weight:   59.9 kg   Height:        Intake/Output Summary (Last 24 hours) at 01/28/2019 0846 Last data filed at 01/28/2019 0725 Gross per 24 hour  Intake  240 ml  Output 3150 ml  Net -2910 ml   Filed Weights   01/24/19 1803 01/28/19 0547  Weight: 58.1 kg 59.9 kg    Examination: General appearance: Thin elderly adult male, alert and in no acute distress.   HEENT: Anicteric, conjunctiva pink, lids and lashes normal. No nasal deformity, discharge, epistaxis.  Lips moist, teeth normal. OP tacky dry, no oral lesions.  Hearing normal Skin: Warm and dry.  No  suspicious rashes or lesions. Cardiac: RRR, no murmurs appreciated.  Chronic venous stasis changes, but no LE edema.    Respiratory: Normal respiratory rate and rhythm at rest, appears dyspneic with talking or exertion.  CTAB without rales or wheezes. Abdomen: Abdomen soft.  No tenderness to palpation or guarding. No ascites, distension, hepatosplenomegaly.   MSK: No deformities or effusions of the large joints of the upper or lower extremities bilaterally.  Diffuse loss of subcutaneous muscle mass and fat.  He has had infiltrations of IVs in both ACs, with some erythema on the right.  No warmth. Neuro: Awake and alert. Naming is grossly intact, and the patient's recall, recent and remote, as well as general fund of knowledge seem within normal limits.  Muscle tone-, without fasciculations.  Moves all extremities equally, but severe generalized weakness and with normal coordination.  Marland Kitchen Speech fluent.    Psych: Sensorium intact and responding to questions, attention normal. Affect normal.  Judgment and insight appear normal.      Data Reviewed: I have personally reviewed following labs and imaging studies:  CBC: Recent Labs  Lab 01/24/19 2027 01/25/19 0326 01/26/19 0410 01/27/19 0600 01/28/19 0424  WBC 8.7 7.9 7.9 10.4 10.2  NEUTROABS 6.3 7.1 6.4 8.5*  --   HGB 10.2* 11.2* 9.8* 10.3* 11.1*  HCT 31.8* 33.4* 29.2* 31.7* 33.2*  MCV 91.1 86.5 87.7 90.3 89.2  PLT 209 210 188 213 [id number]   Basic Metabolic Panel: Recent Labs  Lab 01/24/19 2027 01/25/19 0326 01/26/19 0410 01/27/19 0600 01/28/19 0424  NA 140 139 139 139 138  K 3.7 3.2* 4.5 4.9 4.3  CL 103 102 106 102 95*  CO2 27 25 25 30  33*  GLUCOSE 110* 123* 120* 111* 85  BUN 23 20 27* 28* 29*  CREATININE 0.79 0.60* 0.70 0.67 0.76  CALCIUM 8.1* 7.9* 8.0* 8.3* 8.6*  MG  --  1.9 2.2 2.2  --   PHOS  --  3.3 2.9 2.9  --    GFR: Estimated Creatinine Clearance: 54.1 mL/min (by C-G formula based on SCr of 0.76 mg/dL). Liver Function  Tests: Recent Labs  Lab 01/25/19 0326 01/26/19 0410 01/27/19 0600 01/28/19 0424  AST 23 19 23 25   ALT 17 16 20 24   ALKPHOS 76 70 70 82  BILITOT 0.7 0.5 0.5 0.7  PROT 6.4* 5.9* 6.3* 6.4*  ALBUMIN 2.6* 2.3* 2.4* 2.7*   No results for input(s): LIPASE, AMYLASE in the last 168 hours. No results for input(s): AMMONIA in the last 168 hours. Coagulation Profile: Recent Labs  Lab 01/25/19 0326 01/26/19 0636 01/26/19 0956 01/27/19 0600 01/28/19 0424  INR 2.6* 4.5* 4.5* 4.8* 3.4*   Cardiac Enzymes: No results for input(s): CKTOTAL, CKMB, CKMBINDEX, TROPONINI in the last 168 hours. BNP (last 3 results) No results for input(s): PROBNP in the last 8760 hours. HbA1C: No results for input(s): HGBA1C in the last 72 hours. CBG: No results for input(s): GLUCAP in the last 168 hours. Lipid Profile: No results for input(s): CHOL, HDL, LDLCALC, TRIG, CHOLHDL, LDLDIRECT  in the last 72 hours. Thyroid Function Tests: No results for input(s): TSH, T4TOTAL, FREET4, T3FREE, THYROIDAB in the last 72 hours. Anemia Panel: Recent Labs    01/26/19 0410 01/27/19 0600  FERRITIN 98 93   Urine analysis:    Component Value Date/Time   COLORURINE YELLOW (A) 01/04/2019 1206   APPEARANCEUR CLEAR (A) 01/04/2019 1206   LABSPEC 1.018 01/04/2019 1206   PHURINE 6.0 01/04/2019 1206   GLUCOSEU NEGATIVE 01/04/2019 1206   HGBUR NEGATIVE 01/04/2019 1206   BILIRUBINUR NEGATIVE 01/04/2019 1206   KETONESUR 5 (A) 01/04/2019 1206   PROTEINUR 100 (A) 01/04/2019 1206   NITRITE NEGATIVE 01/04/2019 1206   LEUKOCYTESUR NEGATIVE 01/04/2019 1206   Sepsis Labs: @LABRCNTIP (procalcitonin:4,lacticacidven:4)  ) Recent Results (from the past 240 hour(s))  Culture, blood (Routine X 2) w Reflex to ID Panel     Status: None (Preliminary result)   Collection Time: 01/24/19 10:07 PM   Specimen: BLOOD  Result Value Ref Range Status   Specimen Description BLOOD RIGHT FOREARM  Final   Special Requests   Final    BOTTLES  DRAWN AEROBIC AND ANAEROBIC Blood Culture adequate volume   Culture   Final    NO GROWTH 4 DAYS Performed at [facility], [address] [place], [place] [address]    Report Status PENDING  Incomplete  Culture, blood (Routine X 2) w Reflex to ID Panel     Status: None (Preliminary result)   Collection Time: 01/24/19 10:07 PM   Specimen: BLOOD  Result Value Ref Range Status   Specimen Description BLOOD RIGHT ANTECUBITAL  Final   Special Requests   Final    BOTTLES DRAWN AEROBIC AND ANAEROBIC Blood Culture adequate volume   Culture   Final    NO GROWTH 4 DAYS Performed at [facility], [address] [place], [place] [address]    Report Status PENDING  Incomplete  Respiratory Panel by PCR     Status: None   Collection Time: 01/25/19  3:26 AM   Specimen: Nasopharyngeal Swab; Respiratory  Result Value Ref Range Status   Adenovirus NOT DETECTED NOT DETECTED Final   Coronavirus 229E NOT DETECTED NOT DETECTED Final    Comment: (NOTE) The Coronavirus on the Respiratory Panel, DOES NOT test for the novel  Coronavirus (2019 nCoV)    Coronavirus HKU1 NOT DETECTED NOT DETECTED Final   Coronavirus NL63 NOT DETECTED NOT DETECTED Final   Coronavirus OC43 NOT DETECTED NOT DETECTED Final   Metapneumovirus NOT DETECTED NOT DETECTED Final   Rhinovirus / Enterovirus NOT DETECTED NOT DETECTED Final   Influenza A NOT DETECTED NOT DETECTED Final   Influenza B NOT DETECTED NOT DETECTED Final   Parainfluenza Virus 1 NOT DETECTED NOT DETECTED Final   Parainfluenza Virus 2 NOT DETECTED NOT DETECTED Final   Parainfluenza Virus 3 NOT DETECTED NOT DETECTED Final   Parainfluenza Virus 4 NOT DETECTED NOT DETECTED Final   Respiratory Syncytial Virus NOT DETECTED NOT DETECTED Final   Bordetella pertussis NOT DETECTED NOT DETECTED Final   Chlamydophila pneumoniae NOT DETECTED NOT DETECTED Final   Mycoplasma pneumoniae NOT DETECTED NOT DETECTED Final    Comment: Performed at [facility], [address], [place], [place] [address]  Culture, sputum-assessment     Status: None   Collection Time: 01/25/19  9:57 AM   Specimen: Expectorated Sputum  Result Value Ref Range Status   Specimen Description EXPECTORATED SPUTUM  Final   Special Requests NONE  Final   Sputum  evaluation   Final    Sputum specimen not acceptable for testing.  Please recollect.   Performed at [facility], [address], [place], [place] [address]    Report Status 01/25/2019 FINAL  Final         Radiology Studies: No results found.      Scheduled Meds: . albuterol  2.5 mg Nebulization Q6H  . atorvastatin  20 mg Oral Daily  . diltiazem  300 mg Oral Daily  . loratadine  10 mg Oral Daily  . methylPREDNISolone (SOLU-MEDROL) injection  60 mg Intravenous Q24H  . metoprolol tartrate  50 mg Oral BID  . multivitamin with minerals  1 tablet Oral Daily  . potassium chloride  20 mEq Oral BID  . tiotropium  18 mcg Inhalation Daily  . warfarin  0.5 mg Oral ONCE-1800  . Warfarin - Pharmacist Dosing Inpatient   Does not apply q1800   Continuous Infusions:   LOS: 4 days    Time spent: 25 minutes    Edwin Dada, MD [facility] 01/28/2019, 8:46 AM     Please page though [place] or Epic secure chat:  For [employer], Adult nurse

## 2019-01-28 NOTE — Care Management Important Message (Signed)
Important Message  Patient Details  Name: [name] MRN: [id number] Date of Birth: 1928-08-28   Medicare Important Message Given:  Yes     Dannette Barbara 01/28/2019, 11:23 AM

## 2019-01-29 LAB — BASIC METABOLIC PANEL
Anion gap: 6 (ref 5–15)
BUN: 31 mg/dL — ABNORMAL HIGH (ref 8–23)
CO2: 31 mmol/L (ref 22–32)
Calcium: 8.4 mg/dL — ABNORMAL LOW (ref 8.9–10.3)
Chloride: 96 mmol/L — ABNORMAL LOW (ref 98–111)
Creatinine, Ser: 0.86 mg/dL (ref 0.61–1.24)
GFR calc Af Amer: >60 mL/min (ref >60)
GFR calc non Af Amer: >60 mL/min (ref >60)
Glucose, Bld: 123 mg/dL — ABNORMAL HIGH (ref 70–99)
Potassium: 5.1 mmol/L (ref 3.5–5.1)
Sodium: 133 mmol/L — ABNORMAL LOW (ref 135–145)

## 2019-01-29 LAB — CULTURE, BLOOD (ROUTINE X 2)
Culture: NO GROWTH
Culture: NO GROWTH
Special Requests: ADEQUATE
Special Requests: ADEQUATE

## 2019-01-29 LAB — CBC
HCT: 29.8 % — ABNORMAL LOW (ref 39.0–52.0)
Hemoglobin: 10.1 g/dL — ABNORMAL LOW (ref 13.0–17.0)
MCH: 29.5 pg (ref 26.0–34.0)
MCHC: 33.9 g/dL (ref 30.0–36.0)
MCV: 87.1 fL (ref 80.0–100.0)
Platelets: 244 10*3/uL (ref 150–400)
RBC: 3.42 MIL/uL — ABNORMAL LOW (ref 4.22–5.81)
RDW: 15 % (ref 11.5–15.5)
WBC: 7.7 10*3/uL (ref 4.0–10.5)
nRBC: 0 % (ref 0.0–0.2)

## 2019-01-29 LAB — PROTIME-INR
INR: 2.3 — ABNORMAL HIGH (ref 0.8–1.2)
Prothrombin Time: 25.4 seconds — ABNORMAL HIGH (ref 11.4–15.2)

## 2019-01-29 MED ORDER — IPRATROPIUM-ALBUTEROL 0.5-2.5 (3) MG/3ML IN SOLN
3.0000 mL | Freq: Three times a day (TID) | RESPIRATORY_TRACT | Status: DC
  Administered 2019-01-29 – 2019-01-31 (×6): 3 mL via RESPIRATORY_TRACT
  Filled 2019-01-29 (×5): qty 3

## 2019-01-29 MED ORDER — IPRATROPIUM-ALBUTEROL 0.5-2.5 (3) MG/3ML IN SOLN
3.0000 mL | Freq: Four times a day (QID) | RESPIRATORY_TRACT | Status: DC
  Administered 2019-01-29: 3 mL via RESPIRATORY_TRACT
  Filled 2019-01-29: qty 3

## 2019-01-29 MED ORDER — WARFARIN SODIUM 1 MG PO TABS
1.0000 mg | ORAL_TABLET | Freq: Once | ORAL | Status: AC
  Administered 2019-01-29: 1 mg via ORAL
  Filled 2019-01-29: qty 1

## 2019-01-29 NOTE — Plan of Care (Signed)
  Problem: Clinical Measurements: Goal: Ability to maintain clinical measurements within normal limits will improve Outcome: Progressing Goal: Respiratory complications will improve Outcome: Progressing Goal: Cardiovascular complication will be avoided Outcome: Progressing   Problem: Elimination: Goal: Will not experience complications related to urinary retention Outcome: Progressing   Problem: Cardiac: Goal: Ability to achieve and maintain adequate cardiopulmonary perfusion will improve Outcome: Progressing

## 2019-01-29 NOTE — Plan of Care (Signed)
  Problem: Clinical Measurements: Goal: Respiratory complications will improve Outcome: Progressing Goal: Cardiovascular complication will be avoided Outcome: Progressing   Problem: Activity: Goal: Risk for activity intolerance will decrease Outcome: Progressing   Problem: Activity: Goal: Capacity to carry out activities will improve Outcome: Progressing   Problem: Cardiac: Goal: Ability to achieve and maintain adequate cardiopulmonary perfusion will improve Outcome: Progressing   

## 2019-01-29 NOTE — Progress Notes (Signed)
PROGRESS NOTE    ELRIDGE REASNER  [id number] DOB: 10-26-1928 DOA: 01/24/2019 PCP: Baxter Hire, MD      Brief Narrative:  Mr. Lanoux is a 86 y.o. M with CAD, HTN, A. fib on warfarin, and recent COVID-19 admission 12/7-12/11 who presents with shortness of breath and cough for 1 day.  Patient was weaned from O2 before discharge, had been home from the hospital, with prednisone taper, SpO2 in 90s for about a week, until finished prednisone taper, been doing fairly well, some shortness of breath with ambulation and cough although managing until a few days prior to admission when O2 sats were lower, then the day of admission when he became severely short of breath, tripoding, having difficulty to breathe.  In the ER, patient was tachypneic and tachycardic.  Afebrile, SPO2 75% on room air.  Chest x-ray showed bilateral opacities. Started on empiric antibiotics.         Assessment & Plan:  Post-COVID acute hypoxic respiratory failure Patient several weeks from initial SARS infection, was weaned to room air in hospital, discharged off O2, but worsened after prednisone taper was finished.  On return here, was started empirically on antibiotics and didn't improve. Given diuresis and didn't improve.    Given his improvement with steroids previously at his last hospital discharge, Solu-medrol was started 12/31. -Continue Solu-Medrol -Turn/cough/IS/flutter   Acute on chronic diastolic CHF Mild, treated with Lasix x1 day, now no swelling on exam.  No clinical fluid overload -Hold Lasix    Atrial fibrillation, paroxysmal Heart rate controlled -Continue metoprolol -Continue warfarin    Supratherapeutic INR Warfarin held, no clinical bleeding, INR normalized. -Continue warfarin -Daily INR   Coronary disease, secondary prevention Hypertension Blood pressure controlled -Continue metoprolol -Continue atorvastatin  Pneumonia ruled out  Pro calcitonin nondetectable, no  leukocytosis. No improvement with antibiotics -Stop antibiotics  Anemia Hemoglobin stable, no clinical bleeding   COPD No bronchospasm on exam -Continue home bronchodilators      MDM and disposition: The below labs and imaging reports reviewed and summarized above.  Medication management as above.    The patient was admitted with acute hypoxic respiratory failure, he did not respond to antibiotics or diuresis.    Steroids now been started given his prior response, and will attempt to wean oxygen.   However he still on 4 L, which is a new oxygen requirement, and significantly short of breath with ambulating just a short distance.       Barriers to discharge: Need to see response to steroid Likely to home, will need HH      DVT prophylaxis: Not applicable, on warfarin Code Status: DNR Family Communication:      Consultants:     Procedures:     Antimicrobials:   Azithromycin 12/27>>12/29  Ceftriaxone 12/27>> 12/29 and cefdinir 12/30   Subjective: Patient feeling somewhat better.  No fever, sputum, confusion, vomiting, headache.  He still somewhat weak, but improving, good appetite.  Objective: Vitals:   01/28/19 1505 01/28/19 1932 01/29/19 0541 01/29/19 0816  BP: (!) 114/55 131/64 (!) 148/76 (!) 148/80  Pulse: 65 84 92 69  Resp: 16 19 18 14   Temp: 98 F (36.7 C) 97.6 F (36.4 C) 97.8 F (36.6 C) 97.8 F (36.6 C)  TempSrc: Oral Oral Oral Oral  SpO2: 97% 100% 99% 99%  Weight:   60.3 kg   Height:        Intake/Output Summary (Last 24 hours) at 01/29/2019 0947 Last data filed at 01/29/2019  [id number] Gross per 24 hour  Intake -  Output 1225 ml  Net -1225 ml   Filed Weights   01/24/19 1803 01/28/19 0547 01/29/19 0541  Weight: 58.1 kg 59.9 kg 60.3 kg    Examination: General appearance: Thin elderly adult male, alert and in no acute distress.  Sitting on the edge of the bed. HEENT: Anicteric, conjunctiva pink, lids and lashes normal. No nasal  deformity, discharge, epistaxis.  Lips moist, partially edentulous, oropharynx tacky dry, no oral lesions, hearing normal   Skin: Warm and dry.  No suspicious rashes or lesions. Cardiac: RRR, no murmurs appreciated.  Chronic venous stasis changes, but no LE edema.    Respiratory: Tachypneic with minimal exertion, lungs now have faint expiratory wheezes.    Abdomen: Abdomen soft.  Tenderness palpation or guarding. No ascites, distension, hepatosplenomegaly.   MSK: No deformities or effusions of the large joints of the upper or lower extremities bilaterally.  Diffuse loss of subcutaneous muscle mass intact. Neuro: Awake and alert. Naming is grossly intact, and the patient's recall, recent and remote, as well as general fund of knowledge seem within normal limits.  Muscle tone normal, without fasciculations.  Moves all extremities equally but with generalized weakness and with normal coordination.  Marland Kitchen Speech fluent.    Psych: Sensorium intact and responding to questions, attention normal. Affect normal, pleasant.  Judgment and insight appear normal.        Data Reviewed: I have personally reviewed following labs and imaging studies:  CBC: Recent Labs  Lab 01/24/19 2027 01/25/19 0326 01/26/19 0410 01/27/19 0600 01/28/19 0424 01/29/19 0508  WBC 8.7 7.9 7.9 10.4 10.2 7.7  NEUTROABS 6.3 7.1 6.4 8.5*  --   --   HGB 10.2* 11.2* 9.8* 10.3* 11.1* 10.1*  HCT 31.8* 33.4* 29.2* 31.7* 33.2* 29.8*  MCV 91.1 86.5 87.7 90.3 89.2 87.1  PLT 209 210 188 213 238 [id number]   Basic Metabolic Panel: Recent Labs  Lab 01/25/19 0326 01/26/19 0410 01/27/19 0600 01/28/19 0424 01/29/19 0508  NA 139 139 139 138 133*  K 3.2* 4.5 4.9 4.3 5.1  CL 102 106 102 95* 96*  CO2 25 25 30  33* 31  GLUCOSE 123* 120* 111* 85 123*  BUN 20 27* 28* 29* 31*  CREATININE 0.60* 0.70 0.67 0.76 0.86  CALCIUM 7.9* 8.0* 8.3* 8.6* 8.4*  MG 1.9 2.2 2.2  --   --   PHOS 3.3 2.9 2.9  --   --    GFR: Estimated Creatinine Clearance:  50.6 mL/min (by C-G formula based on SCr of 0.86 mg/dL). Liver Function Tests: Recent Labs  Lab 01/25/19 0326 01/26/19 0410 01/27/19 0600 01/28/19 0424  AST 23 19 23 25   ALT 17 16 20 24   ALKPHOS 76 70 70 82  BILITOT 0.7 0.5 0.5 0.7  PROT 6.4* 5.9* 6.3* 6.4*  ALBUMIN 2.6* 2.3* 2.4* 2.7*   No results for input(s): LIPASE, AMYLASE in the last 168 hours. No results for input(s): AMMONIA in the last 168 hours. Coagulation Profile: Recent Labs  Lab 01/26/19 0636 01/26/19 0956 01/27/19 0600 01/28/19 0424 01/29/19 0508  INR 4.5* 4.5* 4.8* 3.4* 2.3*   Cardiac Enzymes: No results for input(s): CKTOTAL, CKMB, CKMBINDEX, TROPONINI in the last 168 hours. BNP (last 3 results) No results for input(s): PROBNP in the last 8760 hours. HbA1C: No results for input(s): HGBA1C in the last 72 hours. CBG: No results for input(s): GLUCAP in the last 168 hours. Lipid Profile: No results for input(s): CHOL, HDL, LDLCALC,  TRIG, CHOLHDL, LDLDIRECT in the last 72 hours. Thyroid Function Tests: No results for input(s): TSH, T4TOTAL, FREET4, T3FREE, THYROIDAB in the last 72 hours. Anemia Panel: Recent Labs    01/27/19 0600  FERRITIN 93   Urine analysis:    Component Value Date/Time   COLORURINE YELLOW (A) 01/04/2019 1206   APPEARANCEUR CLEAR (A) 01/04/2019 1206   LABSPEC 1.018 01/04/2019 1206   PHURINE 6.0 01/04/2019 1206   GLUCOSEU NEGATIVE 01/04/2019 1206   HGBUR NEGATIVE 01/04/2019 1206   BILIRUBINUR NEGATIVE 01/04/2019 1206   KETONESUR 5 (A) 01/04/2019 1206   PROTEINUR 100 (A) 01/04/2019 1206   NITRITE NEGATIVE 01/04/2019 1206   LEUKOCYTESUR NEGATIVE 01/04/2019 1206   Sepsis Labs: @LABRCNTIP (procalcitonin:4,lacticacidven:4)  ) Recent Results (from the past 240 hour(s))  Culture, blood (Routine X 2) w Reflex to ID Panel     Status: None   Collection Time: 01/24/19 10:07 PM   Specimen: BLOOD  Result Value Ref Range Status   Specimen Description BLOOD RIGHT FOREARM  Final    Special Requests   Final    BOTTLES DRAWN AEROBIC AND ANAEROBIC Blood Culture adequate volume   Culture   Final    NO GROWTH 5 DAYS Performed at [facility], [address] [place], [place] [address]    Report Status 01/29/2019 FINAL  Final  Culture, blood (Routine X 2) w Reflex to ID Panel     Status: None   Collection Time: 01/24/19 10:07 PM   Specimen: BLOOD  Result Value Ref Range Status   Specimen Description BLOOD RIGHT ANTECUBITAL  Final   Special Requests   Final    BOTTLES DRAWN AEROBIC AND ANAEROBIC Blood Culture adequate volume   Culture   Final    NO GROWTH 5 DAYS Performed at [facility], [address] [place], [place] [address]    Report Status 01/29/2019 FINAL  Final  Respiratory Panel by PCR     Status: None   Collection Time: 01/25/19  3:26 AM   Specimen: Nasopharyngeal Swab; Respiratory  Result Value Ref Range Status   Adenovirus NOT DETECTED NOT DETECTED Final   Coronavirus 229E NOT DETECTED NOT DETECTED Final    Comment: (NOTE) The Coronavirus on the Respiratory Panel, DOES NOT test for the novel  Coronavirus (2019 nCoV)    Coronavirus HKU1 NOT DETECTED NOT DETECTED Final   Coronavirus NL63 NOT DETECTED NOT DETECTED Final   Coronavirus OC43 NOT DETECTED NOT DETECTED Final   Metapneumovirus NOT DETECTED NOT DETECTED Final   Rhinovirus / Enterovirus NOT DETECTED NOT DETECTED Final   Influenza A NOT DETECTED NOT DETECTED Final   Influenza B NOT DETECTED NOT DETECTED Final   Parainfluenza Virus 1 NOT DETECTED NOT DETECTED Final   Parainfluenza Virus 2 NOT DETECTED NOT DETECTED Final   Parainfluenza Virus 3 NOT DETECTED NOT DETECTED Final   Parainfluenza Virus 4 NOT DETECTED NOT DETECTED Final   Respiratory Syncytial Virus NOT DETECTED NOT DETECTED Final   Bordetella pertussis NOT DETECTED NOT DETECTED Final   Chlamydophila pneumoniae NOT DETECTED NOT DETECTED Final   Mycoplasma pneumoniae NOT DETECTED NOT DETECTED Final    Comment:  Performed at [facility], [address], [place], [place] [address]  Culture, sputum-assessment     Status: None   Collection Time: 01/25/19  9:57 AM   Specimen: Expectorated Sputum  Result Value Ref Range Status   Specimen Description EXPECTORATED SPUTUM  Final   Special Requests NONE  Final   Sputum evaluation  Final    Sputum specimen not acceptable for testing.  Please recollect.   Performed at [facility], [address], [place], [place] [address]    Report Status 01/25/2019 FINAL  Final         Radiology Studies: No results found.      Scheduled Meds: . atorvastatin  20 mg Oral Daily  . diltiazem  300 mg Oral Daily  . ipratropium-albuterol  3 mL Nebulization QID  . loratadine  10 mg Oral Daily  . methylPREDNISolone (SOLU-MEDROL) injection  60 mg Intravenous Q24H  . metoprolol tartrate  50 mg Oral BID  . multivitamin with minerals  1 tablet Oral Daily  . tiotropium  18 mcg Inhalation Daily  . warfarin  1 mg Oral ONCE-1800  . Warfarin - Pharmacist Dosing Inpatient   Does not apply q1800   Continuous Infusions:   LOS: 5 days    Time spent: 25 minutes    Edwin Dada, MD [facility] 01/29/2019, 9:47 AM     Please page though [place] or Epic secure chat:  For [employer], Adult nurse

## 2019-01-29 NOTE — Consult Note (Signed)
[facility] for Warfarin  Indication: atrial fibrillation  No Known Allergies  Patient Measurements: Height: 5\' 10"  (177.8 cm) Weight: 133 lb (60.3 kg) IBW/kg (Calculated) : 73   Vital Signs: Temp: 97.8 F (36.6 C) (01/01 0816) Temp Source: Oral (01/01 0816) BP: 148/80 (01/01 0816) Pulse Rate: 69 (01/01 0816)  Labs: Recent Labs    01/27/19 0600 01/28/19 0424 01/29/19 0508  HGB 10.3* 11.1* 10.1*  HCT 31.7* 33.2* 29.8*  PLT 213 238 244  LABPROT 45.1* 34.0* 25.4*  INR 4.8* 3.4* 2.3*  CREATININE 0.67 0.76 0.86    Estimated Creatinine Clearance: 50.6 mL/min (by C-G formula based on SCr of 0.86 mg/dL).   Medications:  Per chart review:  Warfarin 2 mg Sat & Sun  Warfarin 3 mg Mon through Fri  Per patient, last warfarin dose was 2 mg on 12/24 or 12/25 as a result of being told to hold warfarin d/t INR being elevated. He was told to resume 2 mg and was scheduled to have his INR rechecked today.   Assessment: Pharmacy has been consulted for Warfarin dosing in patient presenting with COVID-19 who has a PMH significant for atrial fibrillation. Patient was recently discharged on 12/14 with an elevated INR and discharge instructions indicated for the patient to HOLD warfarin for 2 days and f/u with his PCP.   Per chart Care Everywhere, on 12/22 INR was 4.8; patient was instructed to HOLD warfarin 12/23 and 12/24 then resume 2 mg daily and f/u INR on 12/28.  Previous drug interactions:   12/29 - azithromycin (may have increased INR) 12/30 - cefdinir (also may increase INR)  HGB/PLT stable  Today's INR: Supratherapeutic  Date INR  Dose  12/27 3.4 held 12/28 2.6       1.5mg  12/29   4.5       held 12/30   4.8       held 12/31   3.4 0.5 mg 1/1 2.3  Goal of Therapy:  INR 2-3 Monitor platelets by anticoagulation protocol: Yes   Plan:  INR therapeutic - will dose Warfarin 1mg  x 1 dose tonight  Will check INR daily and CBC every 3  days.   Noralee Space, PharmD Clinical Pharmacist 01/29/2019 8:45 AM

## 2019-01-30 LAB — PROTIME-INR
INR: 1.7 — ABNORMAL HIGH (ref 0.8–1.2)
Prothrombin Time: 19.5 seconds — ABNORMAL HIGH (ref 11.4–15.2)

## 2019-01-30 MED ORDER — SODIUM CHLORIDE 0.9% FLUSH
10.0000 mL | Freq: Two times a day (BID) | INTRAVENOUS | Status: DC
  Administered 2019-01-30 – 2019-01-31 (×2): 10 mL via INTRAVENOUS

## 2019-01-30 MED ORDER — WARFARIN SODIUM 2 MG PO TABS
2.0000 mg | ORAL_TABLET | Freq: Once | ORAL | Status: AC
  Administered 2019-01-30: 2 mg via ORAL
  Filled 2019-01-30: qty 1

## 2019-01-30 MED ORDER — ALBUTEROL SULFATE (2.5 MG/3ML) 0.083% IN NEBU: 2.5000 mg | INHALATION_SOLUTION | RESPIRATORY_TRACT | Status: DC | PRN

## 2019-01-30 NOTE — Plan of Care (Signed)
  Problem: Clinical Measurements: Goal: Respiratory complications will improve Outcome: Progressing   Problem: Cardiac: Goal: Ability to achieve and maintain adequate cardiopulmonary perfusion will improve Outcome: Progressing   

## 2019-01-30 NOTE — Progress Notes (Signed)
PROGRESS NOTE    [name]  [id number] DOB: 1928/02/10 DOA: 01/24/2019 PCP: Baxter Hire, MD      Brief Narrative:  [name] is a 86 y.o. M with CAD, HTN, A. fib on warfarin, and recent COVID-19 admission 12/7-12/11 who presents with shortness of breath and cough for 1 day.  Patient was weaned from O2 before discharge, had been home from the hospital, with prednisone taper, SpO2 in 90s for about a week, until finished prednisone taper, been doing fairly well, some shortness of breath with ambulation and cough although managing until a few days prior to admission when O2 sats were lower, then the day of admission when he became severely short of breath, tripoding, having difficulty to breathe.  In the ER, patient was tachypneic and tachycardic.  Afebrile, SPO2 75% on room air.  Chest x-ray showed bilateral opacities. Started on empiric antibiotics.            Assessment & Plan:  COPD exacerbation Patient several weeks from initial SARS infection, was weaned to room air in hospital, discharged off O2, but worsened after prednisone taper was finished.  On return here, was started empirically on antibiotics and didn't improve. Given diuresis and didn't improve.    Given his improvement with steroids previously at his last hospital discharge, Solu-medrol was started 12/31.  Subsequently opened up, and wheezes were noted.  I suspect this is all COPD flare.  Now weaned down to 2 L, clinically improving. -Continue Solu-Medrol -Continue scheduled bronchodilators    Acute on chronic diastolic CHF Mild, treated with Lasix x1 day, now no swelling on exam.  Appears euvolemic   Atrial fibrillation, paroxysmal Rate controlled -Continue metoprolol -Continue warfarin    Supratherapeutic INR Warfarin held, no clinical bleeding, INR now subnormal -Continue warfarin -Daily INR   Coronary disease, secondary prevention Hypertension Blood pressure  controlled -Continue metoprolol -Continue atorvastatin  Pneumonia ruled out  Pro calcitonin nondetectable, no leukocytosis. No improvement with antibiotics  Anemia Hemoglobin stable, no clinical bleeding          MDM and disposition: The below labs and imaging reports reviewed and summarized above.  Medication management as above.    The patient was admitted with acute hypoxic respiratory failure, he did not respond to antibiotics or diuresis.    He is now improving quite well on steroids, but still requiring 2 L oxygen and with significant shortness of breath.  We will continue steroids and frequent bronchodilators, hopefully will be ready for discharge within 24 hours         DVT prophylaxis: Not applicable, on warfarin Code Status: DNR Family Communication: Daughter by phone    Consultants:     Procedures:     Antimicrobials:   Azithromycin 12/27>>12/29  Ceftriaxone 12/27>> 12/29 and cefdinir 12/30   Subjective: Patient feeling a little weak this morning, but no new fever, sputum production, confusion, nausea, chest pain, swelling, orthopnea, or aches.         Objective: Vitals:   01/29/19 2051 01/30/19 0442 01/30/19 0651 01/30/19 0739  BP:  (!) 144/75  138/85  Pulse:  (!) 108 89 98  Resp:    16  Temp:  97.6 F (36.4 C)  97.8 F (36.6 C)  TempSrc:  Oral  Oral  SpO2: 96% 97%  99%  Weight:      Height:        Intake/Output Summary (Last 24 hours) at 01/30/2019 0839 Last data filed at 01/30/2019 0742 Gross per 24  hour  Intake -  Output 500 ml  Net -500 ml   Filed Weights   01/24/19 1803 01/28/19 0547 01/29/19 0541  Weight: 58.1 kg 59.9 kg 60.3 kg    Examination: General appearance: Thin elderly adult male, alert and in no acute distress.  Sitting on the edge of the bed, eating breakfast HEENT: Anicteric, conjunctiva pink, lids and lashes normal. No nasal deformity, discharge, epistaxis.  Lips moist, teeth normal. OP moist, no oral  lesions.   Skin: Warm and dry.  No suspicious rashes or lesions.  Chronic venous stasis changes to bilateral legs. Cardiac: RRR, no murmurs appreciated.  No LE edema.    Respiratory: Somewhat labored with exertion.  Out of breath with talking.  Lung sounds diminished bilaterally, worse at the bases, airflow improved in the apices, and wheezing is now much more prominent bilaterally.   Abdomen: Abdomen soft.  No tenderness palpation or guarding. No ascites, distension, hepatosplenomegaly.   MSK: No deformities or effusions of the large joints of the upper or lower extremities bilaterally.  Diffuse loss of subcutaneous muscle mass and fat. Neuro: Awake and alert. Naming is grossly intact, and the patient's recall, recent and remote, as well as general fund of knowledge seem within normal limits.  Muscle tone diminished, without fasciculations.  Moves all extremities equally with generalized weakness and with normal coordination.  Marland Kitchen Speech fluent.    Psych: Sensorium intact and responding to questions, attention normal. Affect normal, pleasant.  Judgment and insight appear normal.          Data Reviewed: I have personally reviewed following labs and imaging studies:  CBC: Recent Labs  Lab 01/24/19 2027 01/25/19 0326 01/26/19 0410 01/27/19 0600 01/28/19 0424 01/29/19 0508  WBC 8.7 7.9 7.9 10.4 10.2 7.7  NEUTROABS 6.3 7.1 6.4 8.5*  --   --   HGB 10.2* 11.2* 9.8* 10.3* 11.1* 10.1*  HCT 31.8* 33.4* 29.2* 31.7* 33.2* 29.8*  MCV 91.1 86.5 87.7 90.3 89.2 87.1  PLT 209 210 188 213 238 [id number]   Basic Metabolic Panel: Recent Labs  Lab 01/25/19 0326 01/26/19 0410 01/27/19 0600 01/28/19 0424 01/29/19 0508  NA 139 139 139 138 133*  K 3.2* 4.5 4.9 4.3 5.1  CL 102 106 102 95* 96*  CO2 25 25 30  33* 31  GLUCOSE 123* 120* 111* 85 123*  BUN 20 27* 28* 29* 31*  CREATININE 0.60* 0.70 0.67 0.76 0.86  CALCIUM 7.9* 8.0* 8.3* 8.6* 8.4*  MG 1.9 2.2 2.2  --   --   PHOS 3.3 2.9 2.9  --   --     GFR: Estimated Creatinine Clearance: 50.6 mL/min (by C-G formula based on SCr of 0.86 mg/dL). Liver Function Tests: Recent Labs  Lab 01/25/19 0326 01/26/19 0410 01/27/19 0600 01/28/19 0424  AST 23 19 23 25   ALT 17 16 20 24   ALKPHOS 76 70 70 82  BILITOT 0.7 0.5 0.5 0.7  PROT 6.4* 5.9* 6.3* 6.4*  ALBUMIN 2.6* 2.3* 2.4* 2.7*   No results for input(s): LIPASE, AMYLASE in the last 168 hours. No results for input(s): AMMONIA in the last 168 hours. Coagulation Profile: Recent Labs  Lab 01/26/19 0956 01/27/19 0600 01/28/19 0424 01/29/19 0508 01/30/19 0404  INR 4.5* 4.8* 3.4* 2.3* 1.7*   Cardiac Enzymes: No results for input(s): CKTOTAL, CKMB, CKMBINDEX, TROPONINI in the last 168 hours. BNP (last 3 results) No results for input(s): PROBNP in the last 8760 hours. HbA1C: No results for input(s): HGBA1C in the last  72 hours. CBG: No results for input(s): GLUCAP in the last 168 hours. Lipid Profile: No results for input(s): CHOL, HDL, LDLCALC, TRIG, CHOLHDL, LDLDIRECT in the last 72 hours. Thyroid Function Tests: No results for input(s): TSH, T4TOTAL, FREET4, T3FREE, THYROIDAB in the last 72 hours. Anemia Panel: No results for input(s): VITAMINB12, FOLATE, FERRITIN, TIBC, IRON, RETICCTPCT in the last 72 hours. Urine analysis:    Component Value Date/Time   COLORURINE YELLOW (A) 01/04/2019 1206   APPEARANCEUR CLEAR (A) 01/04/2019 1206   LABSPEC 1.018 01/04/2019 1206   PHURINE 6.0 01/04/2019 1206   GLUCOSEU NEGATIVE 01/04/2019 1206   HGBUR NEGATIVE 01/04/2019 1206   BILIRUBINUR NEGATIVE 01/04/2019 1206   KETONESUR 5 (A) 01/04/2019 1206   PROTEINUR 100 (A) 01/04/2019 1206   NITRITE NEGATIVE 01/04/2019 1206   LEUKOCYTESUR NEGATIVE 01/04/2019 1206   Sepsis Labs: @LABRCNTIP (procalcitonin:4,lacticacidven:4)  ) Recent Results (from the past 240 hour(s))  Culture, blood (Routine X 2) w Reflex to ID Panel     Status: None   Collection Time: 01/24/19 10:07 PM   Specimen:  BLOOD  Result Value Ref Range Status   Specimen Description BLOOD RIGHT FOREARM  Final   Special Requests   Final    BOTTLES DRAWN AEROBIC AND ANAEROBIC Blood Culture adequate volume   Culture   Final    NO GROWTH 5 DAYS Performed at [facility], [address] [place], [place] [address]    Report Status 01/29/2019 FINAL  Final  Culture, blood (Routine X 2) w Reflex to ID Panel     Status: None   Collection Time: 01/24/19 10:07 PM   Specimen: BLOOD  Result Value Ref Range Status   Specimen Description BLOOD RIGHT ANTECUBITAL  Final   Special Requests   Final    BOTTLES DRAWN AEROBIC AND ANAEROBIC Blood Culture adequate volume   Culture   Final    NO GROWTH 5 DAYS Performed at [facility], [address] [place], [place] [address]    Report Status 01/29/2019 FINAL  Final  Respiratory Panel by PCR     Status: None   Collection Time: 01/25/19  3:26 AM   Specimen: Nasopharyngeal Swab; Respiratory  Result Value Ref Range Status   Adenovirus NOT DETECTED NOT DETECTED Final   Coronavirus 229E NOT DETECTED NOT DETECTED Final    Comment: (NOTE) The Coronavirus on the Respiratory Panel, DOES NOT test for the novel  Coronavirus (2019 nCoV)    Coronavirus HKU1 NOT DETECTED NOT DETECTED Final   Coronavirus NL63 NOT DETECTED NOT DETECTED Final   Coronavirus OC43 NOT DETECTED NOT DETECTED Final   Metapneumovirus NOT DETECTED NOT DETECTED Final   Rhinovirus / Enterovirus NOT DETECTED NOT DETECTED Final   Influenza A NOT DETECTED NOT DETECTED Final   Influenza B NOT DETECTED NOT DETECTED Final   Parainfluenza Virus 1 NOT DETECTED NOT DETECTED Final   Parainfluenza Virus 2 NOT DETECTED NOT DETECTED Final   Parainfluenza Virus 3 NOT DETECTED NOT DETECTED Final   Parainfluenza Virus 4 NOT DETECTED NOT DETECTED Final   Respiratory Syncytial Virus NOT DETECTED NOT DETECTED Final   Bordetella pertussis NOT DETECTED NOT DETECTED Final   Chlamydophila pneumoniae NOT  DETECTED NOT DETECTED Final   Mycoplasma pneumoniae NOT DETECTED NOT DETECTED Final    Comment: Performed at [facility], [address] [place], [place] [address]  Culture, sputum-assessment     Status: None   Collection Time: 01/25/19  9:57 AM   Specimen: Expectorated Sputum  Result Value Ref Range Status   Specimen Description EXPECTORATED SPUTUM  Final   Special Requests NONE  Final   Sputum evaluation   Final    Sputum specimen not acceptable for testing.  Please recollect.   Performed at [facility], [address], [place], [place] [address]    Report Status 01/25/2019 FINAL  Final         Radiology Studies: No results found.      Scheduled Meds: . atorvastatin  20 mg Oral Daily  . diltiazem  300 mg Oral Daily  . ipratropium-albuterol  3 mL Nebulization TID  . loratadine  10 mg Oral Daily  . methylPREDNISolone (SOLU-MEDROL) injection  60 mg Intravenous Q24H  . metoprolol tartrate  50 mg Oral BID  . multivitamin with minerals  1 tablet Oral Daily  . tiotropium  18 mcg Inhalation Daily  . Warfarin - Pharmacist Dosing Inpatient   Does not apply q1800   Continuous Infusions:   LOS: 6 days    Time spent: 25 minutes    Edwin Dada, MD [facility] 01/30/2019, 8:39 AM     Please page though [place] or Epic secure chat:  For [employer], Adult nurse

## 2019-01-30 NOTE — Consult Note (Signed)
[facility] [place] for Warfarin  Indication: atrial fibrillation  No Known Allergies  Patient Measurements: Height: 5\' 10"  (177.8 cm) Weight: 133 lb (60.3 kg) IBW/kg (Calculated) : 73   Vital Signs: Temp: 97.8 F (36.6 C) (01/02 0739) Temp Source: Oral (01/02 0739) BP: 138/85 (01/02 0739) Pulse Rate: 98 (01/02 0739)  Labs: Recent Labs    01/28/19 0424 01/29/19 0508 01/30/19 0404  HGB 11.1* 10.1*  --   HCT 33.2* 29.8*  --   PLT 238 244  --   LABPROT 34.0* 25.4* 19.5*  INR 3.4* 2.3* 1.7*  CREATININE 0.76 0.86  --     Estimated Creatinine Clearance: 50.6 mL/min (by C-G formula based on SCr of 0.86 mg/dL).   Medications:  Per chart review:  Warfarin 2 mg Sat & Sun  Warfarin 3 mg Mon through Fri  Per patient, last warfarin dose was 2 mg on 12/24 or 12/25 as a result of being told to hold warfarin d/t INR being elevated. He was told to resume 2 mg and was scheduled to have his INR rechecked today.   Assessment: Pharmacy has been consulted for Warfarin dosing in patient presenting with COVID-19 who has a PMH significant for atrial fibrillation. Patient was recently discharged on 12/14 with an elevated INR and discharge instructions indicated for the patient to HOLD warfarin for 2 days and f/u with his PCP.   Per chart Care Everywhere, on 12/22 INR was 4.8; patient was instructed to HOLD warfarin 12/23 and 12/24 then resume 2 mg daily and f/u INR on 12/28.  Previous drug interactions:   12/29 - azithromycin (may have increased INR) 12/30 - cefdinir (also may increase INR)  HGB/PLT stable  Today's INR: Supratherapeutic  Date INR  Dose  12/27 3.4 held 12/28 2.6       1.5mg  12/29   4.5       held 12/30   4.8       held 12/31   3.4 0.5 mg 1/1 2.3 1 mg 1/2 1.7   Goal of Therapy:  INR 2-3 Monitor platelets by anticoagulation protocol: Yes   Plan:  INR subtherapeutic - will dose Warfarin 2 mg x 1 dose tonight  Will check INR daily  and CBC every 3 days.   Rocky Morel, PharmD, BCPS Clinical Pharmacist 01/30/2019 11:41 AM

## 2019-01-30 NOTE — TOC Initial Note (Signed)
Transition of Care [facility]) - Initial/Assessment Note    Patient Details  Name: [name] MRN: [id number] Date of Birth: 1928/02/27  Transition of Care [facility]) CM/SW Contact:    Ross Ludwig, LCSW Phone Number: 01/30/2019, 8:09 PM  Clinical Narrative:                  Patient is an 86 year old male who is alert and oriented x4.  Patient had some confusion assessment completed by speaking to patients daughter.  Patient lives by himself, and has not had home health before.  Patient is currently followed by palliative outpatient.  Patient is new oxygen, and will also need new nebulizer.  [name] oxygen machine was set up on Thursday, patient's concentrator will have to be delivered to his home.  [name] daughter is the main contact and would like to be called for arrangements for oxygen delivery.  Expected Discharge Plan: [place] Barriers to Discharge: Continued Medical Work up   Patient Goals and CMS Choice Patient states their goals for this hospitalization and ongoing recovery are:: To return back home with home health. CMS Medicare.gov Compare Post Acute Care list provided to:: Patient Represenative (must comment) Choice offered to / list presented to : Adult Children  Expected Discharge Plan and Services Expected Discharge Plan: [place] Choice: [place] arrangements for the past 2 months: Single Family Home                 DME Arranged: Oxygen, Nebulizer machine DME Agency: [employer] Date DME Agency Contacted: 01/30/19 Time DME Agency Contacted: 24 Representative spoke with at DME Agency: [place]: PT, RN, Nurse's Aide [place] Agency: [facility] Date [place]: 01/30/19 Time HH Agency Contacted: 1500 Representative spoke with at [place] Arrangements/Services Living arrangements for the past 2 months: [place] with::  Self Patient language and need for interpreter reviewed:: Yes Do you feel safe going back to the place where you live?: Yes      Need for Family Participation in Patient Care: Yes (Comment) Care giver support system in place?: Yes (comment)   Criminal Activity/Legal Involvement Pertinent to Current Situation/Hospitalization: No - Comment as needed  Activities of Daily Living Home Assistive Devices/Equipment: Cane (specify quad or straight), Dentures (specify type) ADL Screening (condition at time of admission) [name] cognitive ability adequate to safely complete daily activities?: Yes Is the patient deaf or have difficulty hearing?: No Does the patient have difficulty seeing, even when wearing glasses/contacts?: No Does the patient have difficulty concentrating, remembering, or making decisions?: No Patient able to express need for assistance with ADLs?: Yes Does the patient have difficulty dressing or bathing?: No Independently performs ADLs?: Yes (appropriate for developmental age) Does the patient have difficulty walking or climbing stairs?: No Weakness of Legs: Both Weakness of Arms/Hands: None  Permission Sought/Granted Permission sought to share information with : Family Supports Permission granted to share information with : Yes, Verbal Permission Granted  Share Information with NAME: Dunte, [place] Daughter   [phone]           Emotional Assessment Appearance:: Appears stated age   Affect (typically observed): Appropriate, Accepting Orientation: : Oriented to Self, Oriented to Place, Oriented to  Time, Oriented to Situation Alcohol / Substance Use: Not Applicable Psych Involvement: No (comment)  Admission diagnosis:  Hypoxia [R09.02] Acute on chronic diastolic CHF (congestive  heart failure) ([place]) [I50.33] Pneumonia due to COVID-19 virus [U07.1, J12.82] COVID-19 [U07.1] Patient Active Problem List   Diagnosis Date Noted  . Acute on chronic diastolic CHF  (congestive heart failure) ([place]) 01/27/2019  . Acute respiratory failure with hypoxia ([place]) 01/24/2019  . Pneumonia due to COVID-19 virus 01/24/2019  . Atrial fibrillation, chronic ([place]) 01/24/2019  . Atrial fibrillation with RVR ([place]) 01/04/2019  . Constipation 01/04/2019  . HLD (hyperlipidemia) 01/04/2019  . Hypertension   . Coronary artery disease   . Elevated troponin   . COVID-19    PCP:  Baxter Hire, MD Pharmacy:   [place], [place] - [address] [place] [address] Phone: [phone] Fax: [phone]     Social Determinants of Health (SDOH) Interventions    Readmission Risk Interventions No flowsheet data found.

## 2019-01-31 LAB — PROTIME-INR
INR: 1.3 — ABNORMAL HIGH (ref 0.8–1.2)
Prothrombin Time: 16.4 seconds — ABNORMAL HIGH (ref 11.4–15.2)

## 2019-01-31 MED ORDER — WARFARIN SODIUM 3 MG PO TABS
3.0000 mg | ORAL_TABLET | Freq: Once | ORAL | Status: DC
  Filled 2019-01-31: qty 1

## 2019-01-31 MED ORDER — ALBUTEROL SULFATE (2.5 MG/3ML) 0.083% IN NEBU: 2.5000 mg | INHALATION_SOLUTION | Freq: Four times a day (QID) | RESPIRATORY_TRACT | 0 refills | Status: DC | PRN

## 2019-01-31 MED ORDER — PREDNISONE 10 MG PO TABS: ORAL_TABLET | ORAL | 0 refills | Status: DC

## 2019-01-31 MED ORDER — ALBUTEROL SULFATE (2.5 MG/3ML) 0.083% IN NEBU: 2.5000 mg | INHALATION_SOLUTION | RESPIRATORY_TRACT | 12 refills | Status: DC | PRN

## 2019-01-31 NOTE — Discharge Summary (Signed)
Physician Discharge Summary  [name] [id number] DOB: 02/23/1928 DOA: 01/24/2019  PCP: Christopher Hire, MD  Admit date: 01/24/2019 Discharge date: 01/31/2019  Admitted From: Home  Disposition:  Home   Recommendations for Outpatient Follow-up:  1. Follow up with PCP Dr. Edwina Good in 1-2 weeks 2. Dr. Edwina Good: Please check INR Thursday this week 3. Dr. Edwina Good: Please evaluate patient for need for ongoing Lasix      Home Health: PT/OT due to shortness of breath with exertion  Equipment/Devices: Nebulizer, home O2  Discharge Condition: Fair  CODE STATUS: DO NOT RESUSCITATE Diet recommendation: Cardiac  Brief/Interim Summary: Mr. [name] is a 86 y.o. M with CAD, HTN, A. fib on warfarin, and recent COVID-19 admission 12/7-12/11 who presents with shortness of breath and cough for 1 day.  Patient was weaned from O2 before discharge, had been home from the hospital, with prednisone taper, SpO2 in 90s for about a week, until finished prednisone taper, been doing fairly well, some shortness of breath with ambulation and cough although managing until a few days prior to admission when O2 sats were lower, then the day of admission when he became severely short of breath, tripoding, having difficulty to breathe.  In the ER, patient was tachypneic and tachycardic.  Afebrile, SPO2 75% on room air.  Chest x-ray showed bilateral opacities. Started on empiric antibiotics.       PRINCIPAL HOSPITAL DIAGNOSIS: COPD exacerbation    Discharge Diagnoses:   COPD exacerbation with acute hypoxic respiratory failure Patient presented with severe worsening dyspnea/tachypnea and hypoxia, several weeks from initial SARS infection  At time of COVID discharge, he had been weaned to room air (which is his baseline), discharged off O2, discharged with prednisone taper but then worsened after prednisone taper was finished.  On return here, was started empirically on antibiotics and  didn't improve. Given diuresis and didn't improve.    Given his improvement with steroids previously at his last hospital discharge, Solu-medrol was started 12/31.  Subsequently opened up, and wheezes were noted.  I suspect this is all COPD flare.  Now weaned down to 2 L, clinically improving.  Continue 12 day prednisone taper and frequent bronchodilators and follow up with PCP to wean O2, optimize maintenance inhalers.      Acute on chronic diastolic CHF Mild, treated with Lasix x1 day, no further swelling on exam.  Discharged off Lasix.   Atrial fibrillation, paroxysmal Continue metoprolol and warfarin   Supratherapeutic INR Check INR in 4 days  Coronary disease, secondary prevention Hypertension Continue metoprolol and atorvastatin  Pneumonia ruled out  Pro calcitonin nondetectable, no leukocytosis. No improvement with antibiotics  Anemia Hemoglobin stable, no clinical bleeding          Discharge Instructions  Discharge Instructions    Discharge instructions   Complete by: As directed    From Dr. Loleta Good: You were admitted with trouble breathing.   It turned out this was from a COPD exacerbation.  Continue to use the oxygen at all times. Use the oxygen all the time until you see your primary care doctor. The purpose of the  oxygen is to keep your oxygen level at 88% or above Use a pulse oximeter to measure your oxygen level.  If the home health agency doesn't bring you one of these, you can find them at any drug store.   ALSO: Take prednisone according to the following taper:  Take prednisone once daily in the morning Take prednisone 40 mg (4 tabs) for 3  days, then Take prednisone 30 mg (3 tabs) for 3 days, then Take prednisone 20 mg (2 tabs) for 3 days, then  Take prednisone 10 mg (1 tab) for 3 days, then stop   Use the albuterol in the nebulizer three times daily for the next week You can use it as needed as well If you need it more  than once an hour, go to the ER  Resume taking your Spiriva or tiotropium as well  Go see your primary care doctor as soon as you can  Have them check your INR on Thursday or Friday this week  Do not take Lasix until you see your primary care doctor   For home use only DME Nebulizer machine   Complete by: As directed    Patient needs a nebulizer to treat with the following condition: COPD (chronic obstructive pulmonary disease) ([place])   Length of Need: Lifetime   Increase activity slowly   Complete by: As directed      Allergies as of 01/31/2019   No Known Allergies     Medication List    STOP taking these medications   ascorbic acid 500 MG tablet Commonly known as: VITAMIN C   predniSONE 10 MG (21) Tbpk tablet Commonly known as: STERAPRED UNI-PAK 21 TAB Replaced by: predniSONE 10 MG tablet   zinc sulfate 220 (50 Zn) MG capsule     TAKE these medications   albuterol (2.5 MG/3ML) 0.083% nebulizer solution Commonly known as: PROVENTIL Take 3 mLs (2.5 mg total) by nebulization every 2 (two) hours as needed for wheezing or shortness of breath.   albuterol (2.5 MG/3ML) 0.083% nebulizer solution Commonly known as: PROVENTIL Take 3 mLs (2.5 mg total) by nebulization every 6 (six) hours as needed for wheezing or shortness of breath.   atorvastatin 20 MG tablet Commonly known as: LIPITOR Take 20 mg by mouth daily. Notes to patient: Tomorrow at 9A.    bisacodyl 5 MG EC tablet Commonly known as: DULCOLAX Take 5-15 mg by mouth daily as needed for moderate constipation.   cetirizine 10 MG tablet Commonly known as: ZYRTEC Take 10 mg by mouth daily. Notes to patient: Today when home.    diltiazem 300 MG 24 hr capsule Commonly known as: TIAZAC Take 300 mg by mouth daily. Notes to patient: Tomorrow at 9A.    metoprolol tartrate 50 MG tablet Commonly known as: LOPRESSOR Take 50 mg by mouth 2 (two) times daily. Notes to patient: Tonight at 10P.    mometasone 50 MCG/ACT  nasal spray Commonly known as: NASONEX Place 1 spray into the nose daily. Each nostril Notes to patient: Today when home.    predniSONE 10 MG tablet Commonly known as: DELTASONE Take 40 mg for 3 days then 30 mg for 3 days then 20 mg for 3 days then 10 mg for 3 days then stop Replaces: predniSONE 10 MG (21) Tbpk tablet Notes to patient: Today when home.    Spiriva Respimat 1.25 MCG/ACT Aers Generic drug: Tiotropium Bromide Monohydrate Inhale 2 sprays into the lungs daily. Notes to patient: Tomorrow at 9A.    traZODone 50 MG tablet Commonly known as: DESYREL Take 50 mg by mouth at bedtime as needed for sleep.   warfarin 2 MG tablet Commonly known as: COUMADIN Take 2 mg by mouth daily. Notes to patient: Tonight at [place].             Durable Medical Equipment  (From admission, onward)  Start     Ordered   01/30/19 0000  For home use only DME Nebulizer machine    Question Answer Comment  Patient needs a nebulizer to treat with the following condition COPD (chronic obstructive pulmonary disease) ([place])   Length of Need Lifetime      01/30/19 1334   01/28/19 1430  DME Oxygen  Once    Question Answer Comment  Length of Need 6 Months   Mode or (Route) Nasal cannula   Liters per Minute 4   Frequency Continuous (stationary and portable oxygen unit needed)   Oxygen conserving device Yes   Oxygen delivery system Gas      01/28/19 1430         Follow-up Information    Christopher Hire, MD. Schedule an appointment as soon as possible for a visit in 1 week(s).   Specialty: Internal Medicine Contact information: [place] [address] [phone]          No Known Allergies  Consultations:     Procedures/Studies: DG Chest 2 View  Result Date: 01/04/2019 CLINICAL DATA:  Increasing shortness of breath and cough. EXAM: CHEST - 2 VIEW COMPARISON:  CT chest 11/29/2015.  PA and lateral chest 02/01/2014. FINDINGS: The lungs are markedly  emphysematous. There is patchy airspace disease in the left upper and lower lobes. Biapical scar is present. Small bilateral pleural effusions are seen. Heart size is normal. Aortic atherosclerosis. Remote L1 and L2 compression fractures are unchanged since the prior CT. The patient has a T7 compression fracture which is new since the prior CT. IMPRESSION: Patchy airspace disease in the left chest worrisome for pneumonia. Emphysema. Small bilateral pleural effusions. Atherosclerosis. Remote L1 and L2 compression fractures. T7 compression fracture is new since 2017 and appears remote. Electronically Signed   By: Inge Rise M.D.   On: 01/04/2019 11:47   DG Chest Portable 1 View  Result Date: 01/24/2019 CLINICAL DATA:  Shortness of breath, diagnosed with COVID-19 approximately 2 weeks ago, treated and released from hospital, now very short of breath, hypoxic, tripod Ng in triage with retractions, history coronary artery disease, atrial fibrillation, hypertension, former smoker EXAM: PORTABLE CHEST 1 VIEW COMPARISON:  Portable exam 1818 hours compared to 01/04/2019 FINDINGS: Rotated to the LEFT. Normal heart size and mediastinal contours. Atherosclerotic calcification aorta. Emphysematous changes with significantly increased BILATERAL pulmonary infiltrates greatest at RIGHT base. Bibasilar effusions. Findings could represent multifocal pneumonia or pulmonary edema. No pneumothorax. Bones demineralized. IMPRESSION: Significantly increased BILATERAL pulmonary infiltrates question multifocal pneumonia or pulmonary edema. BILATERAL pleural effusions and underlying COPD changes. Aortic Atherosclerosis (ICD10-I70.0). Electronically Signed   By: Lavonia Dana M.D.   On: 01/24/2019 18:57   ECHOCARDIOGRAM COMPLETE  Result Date: 01/07/2019   ECHOCARDIOGRAM REPORT   Patient Name:   RUDDY PACHON Date of Exam: 01/06/2019 Medical Rec #:  [id number]         Height:       69.0 in Accession #:    [id number]         Weight:       130.0 lb Date of Birth:  May 09, 1928         BSA:          1.72 m Patient Age:    52 years          BP:           128/74 mmHg Patient Gender: M  HR:           84 bpm. Exam Location:  [facility] Procedure: 2D Echo, Cardiac Doppler and Color Doppler Indications:     COVID-19                  R06.00 Dyspnea  History:         Patient has no prior history of Echocardiogram examinations.                  Risk Factors:Hypertension. Coronary artery disease.  Sonographer:     Wilford Sports Rodgers-Jones Referring Phys:  [place] Diagnosing Phys: Bartholome Bill MD IMPRESSIONS  1. Left ventricular ejection fraction, by visual estimation, is 55 to 60%. The left ventricle has normal function. Left ventricular septal wall thickness was mildly increased. Mildly increased left ventricular posterior wall thickness. There is mildly increased left ventricular hypertrophy.  2. Left ventricular diastolic parameters are consistent with Grade I diastolic dysfunction (impaired relaxation).  3. Global right ventricle has normal systolic function.The right ventricular size is mildly enlarged. No increase in right ventricular wall thickness.  4. Left atrial size was mildly dilated.  5. Right atrial size was mildly dilated.  6. The mitral valve is grossly normal. Mild to moderate mitral valve regurgitation.  7. The tricuspid valve is grossly normal. Tricuspid valve regurgitation is trivial.  8. The aortic valve is abnormal. Aortic valve regurgitation is trivial. Mild to moderate aortic valve sclerosis/calcification without any evidence of aortic stenosis.  9. The pulmonic valve was not well visualized. Pulmonic valve regurgitation is trivial. 10. Moderately elevated pulmonary artery systolic pressure. 11. The atrial septum is grossly normal. FINDINGS  Left Ventricle: Left ventricular ejection fraction, by visual estimation, is 55 to 60%. The left ventricle has normal function. The left ventricle is not well visualized.  Mildly increased left ventricular posterior wall thickness. There is mildly increased left ventricular hypertrophy. Left ventricular diastolic parameters are consistent with Grade I diastolic dysfunction (impaired relaxation). Right Ventricle: The right ventricular size is mildly enlarged. No increase in right ventricular wall thickness. Global RV systolic function is has normal systolic function. The tricuspid regurgitant velocity is 3.17 m/s, and with an assumed right atrial  pressure of 10 mmHg, the estimated right ventricular systolic pressure is moderately elevated at 50.3 mmHg. Left Atrium: Left atrial size was mildly dilated. Right Atrium: Right atrial size was mildly dilated Pericardium: There is no evidence of pericardial effusion. There is a small pleural effusion. Mitral Valve: The mitral valve is grossly normal. Mild to moderate mitral valve regurgitation. Tricuspid Valve: The tricuspid valve is grossly normal. Tricuspid valve regurgitation is trivial. Aortic Valve: The aortic valve is abnormal. Aortic valve regurgitation is trivial. Aortic regurgitation PHT measures 333 msec. Mild to moderate aortic valve sclerosis/calcification is present, without any evidence of aortic stenosis. Pulmonic Valve: The pulmonic valve was not well visualized. Pulmonic valve regurgitation is trivial. Pulmonic regurgitation is trivial. Aorta: The aortic root is normal in size and structure. IAS/Shunts: The atrial septum is grossly normal.  LEFT VENTRICLE PLAX 2D LVIDd:         4.29 cm LVIDs:         3.09 cm LV PW:         1.01 cm LV IVS:        0.83 cm LVOT diam:     2.00 cm LV SV:         45 ml LV SV Index:   26.70 LVOT Area:  3.14 cm  RIGHT VENTRICLE RV Basal diam:  4.84 cm RV S prime:     9.37 cm/s TAPSE (M-mode): 1.2 cm LEFT ATRIUM             Index       RIGHT ATRIUM           Index LA diam:        5.50 cm 3.20 cm/m  RA Area:     24.00 cm LA Vol (A2C):   80.7 ml 46.91 ml/m RA Volume:   86.20 ml  50.11 ml/m LA  Vol (A4C):   70.7 ml 41.10 ml/m LA Biplane Vol: 76.9 ml 44.70 ml/m  AORTIC VALVE LVOT Vmax:   97.40 cm/s LVOT Vmean:  64.300 cm/s LVOT VTI:    0.184 m AI PHT:      333 msec  AORTA Ao Root diam: 3.30 cm MV E velocity: 101.17 cm/s 103 cm/s TRICUSPID VALVE                                     TR Peak grad:   40.3 mmHg                                     TR Vmax:        354.00 cm/s                                      SHUNTS                                     Systemic VTI:  0.18 m                                     Systemic Diam: 2.00 cm  Bartholome Bill MD Electronically signed by Bartholome Bill MD Signature Date/Time: 01/07/2019/9:32:50 AM    Final        Subjective: Still somewhat weak, some of dyspnea, but improving.  His energy level is better.  No cough, sputum, hemoptysis, confusion.  Discharge Exam: Vitals:   01/31/19 0343 01/31/19 0736  BP: (!) 125/57 (!) 147/104  Pulse: 65 87  Resp: 17 19  Temp: 98.7 F (37.1 C) 98.1 F (36.7 C)  SpO2: 90% 93%   Vitals:   01/30/19 1545 01/30/19 2035 01/31/19 0343 01/31/19 0736  BP: 115/65 134/65 (!) 125/57 (!) 147/104  Pulse: 73 78 65 87  Resp:  20 17 19   Temp: 98.2 F (36.8 C) (!) 97.4 F (36.3 C) 98.7 F (37.1 C) 98.1 F (36.7 C)  TempSrc: Oral Oral Oral Oral  SpO2: 96% 92% 90% 93%  Weight:      Height:        General: Pt is alert, awake, not in acute distress Cardiovascular: RRR, nl S1-S2, no murmurs appreciated.   No LE edema.   Respiratory: Normal respiratory rate and rhythm.  Distant and insertion, mild wheezing with inspiration and expiration. Abdominal: Abdomen soft and non-tender.  No distension or HSM.   Neuro/Psych: Strength symmetric in upper and lower extremities.  Judgment and insight appear normal.  The results of significant diagnostics from this hospitalization (including imaging, microbiology, ancillary and laboratory) are listed below for reference.     Microbiology: Recent Results (from the past 240 hour(s))   Culture, blood (Routine X 2) w Reflex to ID Panel     Status: None   Collection Time: 01/24/19 10:07 PM   Specimen: BLOOD  Result Value Ref Range Status   Specimen Description BLOOD RIGHT FOREARM  Final   Special Requests   Final    BOTTLES DRAWN AEROBIC AND ANAEROBIC Blood Culture adequate volume   Culture   Final    NO GROWTH 5 DAYS Performed at [facility], [address] [place], [place] [address]    Report Status 01/29/2019 FINAL  Final  Culture, blood (Routine X 2) w Reflex to ID Panel     Status: None   Collection Time: 01/24/19 10:07 PM   Specimen: BLOOD  Result Value Ref Range Status   Specimen Description BLOOD RIGHT ANTECUBITAL  Final   Special Requests   Final    BOTTLES DRAWN AEROBIC AND ANAEROBIC Blood Culture adequate volume   Culture   Final    NO GROWTH 5 DAYS Performed at [facility], [address] [place], [place] [address]    Report Status 01/29/2019 FINAL  Final  Respiratory Panel by PCR     Status: None   Collection Time: 01/25/19  3:26 AM   Specimen: Nasopharyngeal Swab; Respiratory  Result Value Ref Range Status   Adenovirus NOT DETECTED NOT DETECTED Final   Coronavirus 229E NOT DETECTED NOT DETECTED Final    Comment: (NOTE) The Coronavirus on the Respiratory Panel, DOES NOT test for the novel  Coronavirus (2019 nCoV)    Coronavirus HKU1 NOT DETECTED NOT DETECTED Final   Coronavirus NL63 NOT DETECTED NOT DETECTED Final   Coronavirus OC43 NOT DETECTED NOT DETECTED Final   Metapneumovirus NOT DETECTED NOT DETECTED Final   Rhinovirus / Enterovirus NOT DETECTED NOT DETECTED Final   Influenza A NOT DETECTED NOT DETECTED Final   Influenza B NOT DETECTED NOT DETECTED Final   Parainfluenza Virus 1 NOT DETECTED NOT DETECTED Final   Parainfluenza Virus 2 NOT DETECTED NOT DETECTED Final   Parainfluenza Virus 3 NOT DETECTED NOT DETECTED Final   Parainfluenza Virus 4 NOT DETECTED NOT DETECTED Final   Respiratory Syncytial Virus NOT  DETECTED NOT DETECTED Final   Bordetella pertussis NOT DETECTED NOT DETECTED Final   Chlamydophila pneumoniae NOT DETECTED NOT DETECTED Final   Mycoplasma pneumoniae NOT DETECTED NOT DETECTED Final    Comment: Performed at [facility], [place] [address], [place], [place] [address]  Culture, sputum-assessment     Status: None   Collection Time: 01/25/19  9:57 AM   Specimen: Expectorated Sputum  Result Value Ref Range Status   Specimen Description EXPECTORATED SPUTUM  Final   Special Requests NONE  Final   Sputum evaluation   Final    Sputum specimen not acceptable for testing.  Please recollect.   Performed at [facility], [address] [place], [place] [address]    Report Status 01/25/2019 FINAL  Final     Labs: BNP (last 3 results) Recent Labs    01/04/19 2023 01/25/19 0326  BNP 1,871.0* [id number]*   Basic Metabolic Panel: Recent Labs  Lab 01/25/19 0326 01/26/19 0410 01/27/19 0600 01/28/19 0424 01/29/19 0508  NA 139 139 139 138 133*  K 3.2* 4.5 4.9 4.3 5.1  CL 102 106 102 95* 96*  CO2 25 25 30  33* 31  GLUCOSE 123* 120* 111* 85 123*  BUN 20 27* 28* 29* 31*  CREATININE 0.60* 0.70 0.67 0.76 0.86  CALCIUM 7.9* 8.0* 8.3* 8.6* 8.4*  MG 1.9 2.2 2.2  --   --   PHOS 3.3 2.9 2.9  --   --    Liver Function Tests: Recent Labs  Lab 01/25/19 0326 01/26/19 0410 01/27/19 0600 01/28/19 0424  AST 23 19 23 25   ALT 17 16 20 24   ALKPHOS 76 70 70 82  BILITOT 0.7 0.5 0.5 0.7  PROT 6.4* 5.9* 6.3* 6.4*  ALBUMIN 2.6* 2.3* 2.4* 2.7*   No results for input(s): LIPASE, AMYLASE in the last 168 hours. No results for input(s): AMMONIA in the last 168 hours. CBC: Recent Labs  Lab 01/24/19 2027 01/25/19 0326 01/26/19 0410 01/27/19 0600 01/28/19 0424 01/29/19 0508  WBC 8.7 7.9 7.9 10.4 10.2 7.7  NEUTROABS 6.3 7.1 6.4 8.5*  --   --   HGB 10.2* 11.2* 9.8* 10.3* 11.1* 10.1*  HCT 31.8* 33.4* 29.2* 31.7* 33.2* 29.8*  MCV 91.1 86.5 87.7 90.3 89.2 87.1  PLT 209 210  188 213 238 244   Cardiac Enzymes: No results for input(s): CKTOTAL, CKMB, CKMBINDEX, TROPONINI in the last 168 hours. BNP: Invalid input(s): POCBNP CBG: No results for input(s): GLUCAP in the last 168 hours. D-Dimer No results for input(s): DDIMER in the last 72 hours. Hgb A1c No results for input(s): HGBA1C in the last 72 hours. Lipid Profile No results for input(s): CHOL, HDL, LDLCALC, TRIG, CHOLHDL, LDLDIRECT in the last 72 hours. Thyroid function studies No results for input(s): TSH, T4TOTAL, T3FREE, THYROIDAB in the last 72 hours.  Invalid input(s): FREET3 Anemia work up No results for input(s): VITAMINB12, FOLATE, FERRITIN, TIBC, IRON, RETICCTPCT in the last 72 hours. Urinalysis    Component Value Date/Time   COLORURINE YELLOW (A) 01/04/2019 1206   APPEARANCEUR CLEAR (A) 01/04/2019 1206   LABSPEC 1.018 01/04/2019 1206   PHURINE 6.0 01/04/2019 1206   GLUCOSEU NEGATIVE 01/04/2019 1206   HGBUR NEGATIVE 01/04/2019 1206   BILIRUBINUR NEGATIVE 01/04/2019 1206   KETONESUR 5 (A) 01/04/2019 1206   PROTEINUR 100 (A) 01/04/2019 1206   NITRITE NEGATIVE 01/04/2019 1206   LEUKOCYTESUR NEGATIVE 01/04/2019 1206   Sepsis Labs Invalid input(s): PROCALCITONIN,  WBC,  LACTICIDVEN Microbiology Recent Results (from the past 240 hour(s))  Culture, blood (Routine X 2) w Reflex to ID Panel     Status: None   Collection Time: 01/24/19 10:07 PM   Specimen: BLOOD  Result Value Ref Range Status   Specimen Description BLOOD RIGHT FOREARM  Final   Special Requests   Final    BOTTLES DRAWN AEROBIC AND ANAEROBIC Blood Culture adequate volume   Culture   Final    NO GROWTH 5 DAYS Performed at [facility], [address] [place], [place] [address]    Report Status 01/29/2019 FINAL  Final  Culture, blood (Routine X 2) w Reflex to ID Panel     Status: None   Collection Time: 01/24/19 10:07 PM   Specimen: BLOOD  Result Value Ref Range Status   Specimen Description BLOOD RIGHT  ANTECUBITAL  Final   Special Requests   Final    BOTTLES DRAWN AEROBIC AND ANAEROBIC Blood Culture adequate volume   Culture   Final    NO GROWTH 5 DAYS Performed at [facility], [address], [place], [place] [address]    Report Status 01/29/2019 FINAL  Final  Respiratory Panel by PCR     Status: None   Collection Time: 01/25/19  3:26 AM   Specimen: Nasopharyngeal Swab; Respiratory  Result Value Ref Range Status   Adenovirus NOT DETECTED NOT DETECTED Final   Coronavirus 229E NOT DETECTED NOT DETECTED Final    Comment: (NOTE) The Coronavirus on the Respiratory Panel, DOES NOT test for the novel  Coronavirus (2019 nCoV)    Coronavirus HKU1 NOT DETECTED NOT DETECTED Final   Coronavirus NL63 NOT DETECTED NOT DETECTED Final   Coronavirus OC43 NOT DETECTED NOT DETECTED Final   Metapneumovirus NOT DETECTED NOT DETECTED Final   Rhinovirus / Enterovirus NOT DETECTED NOT DETECTED Final   Influenza A NOT DETECTED NOT DETECTED Final   Influenza B NOT DETECTED NOT DETECTED Final   Parainfluenza Virus 1 NOT DETECTED NOT DETECTED Final   Parainfluenza Virus 2 NOT DETECTED NOT DETECTED Final   Parainfluenza Virus 3 NOT DETECTED NOT DETECTED Final   Parainfluenza Virus 4 NOT DETECTED NOT DETECTED Final   Respiratory Syncytial Virus NOT DETECTED NOT DETECTED Final   Bordetella pertussis NOT DETECTED NOT DETECTED Final   Chlamydophila pneumoniae NOT DETECTED NOT DETECTED Final   Mycoplasma pneumoniae NOT DETECTED NOT DETECTED Final    Comment: Performed at [facility], [place]. [address], [place], [place] [address]  Culture, sputum-assessment     Status: None   Collection Time: 01/25/19  9:57 AM   Specimen: Expectorated Sputum  Result Value Ref Range Status   Specimen Description EXPECTORATED SPUTUM  Final   Special Requests NONE  Final   Sputum evaluation   Final    Sputum specimen not acceptable for testing.  Please recollect.   Performed at [facility], [address], [place], [place] [address]    Report Status 01/25/2019 FINAL  Final     Time coordinating discharge: 50 minutes      SIGNED:   Edwin Dada, MD  [facility] 01/31/2019, 5:43 PM

## 2019-01-31 NOTE — TOC Transition Note (Signed)
Transition of Care [facility]) - CM/SW Discharge Note   Patient Details  Name: [name] MRN: [id number] Date of Birth: 12-05-1928  Transition of Care [facility]) CM/SW Contact:  Trecia Rogers, LCSW Phone Number: 01/31/2019, 11:55 AM   Clinical Narrative:     Patient will be discharging home with home health through [facility] and will DME ([employer]). Patient will be picked up by his daughter,[name], who will be taking him home.  No other needs.   Final next level of care: [place] Barriers to Discharge: No Barriers Identified   Patient Goals and CMS Choice Patient states their goals for this hospitalization and ongoing recovery are:: To return back home with home health. CMS Medicare.gov Compare Post Acute Care list provided to:: Patient Represenative (must comment) Choice offered to / list presented to : Adult Children  Discharge Placement                       Discharge Plan and Services     Post Acute Care Choice: Home Health          DME Arranged: Oxygen, Nebulizer machine DME Agency: [facility] Date DME Agency Contacted: 01/30/19 Time DME Agency Contacted: 63 Representative spoke with at DME Agency: [place]: PT, RN, Nurse's Aide [place] Agency: [facility] [place] Date [place]: 01/30/19 Time [place]: 1500 Representative spoke with at [place]: Cassie  Social Determinants of Health ([place]) Interventions     Readmission Risk Interventions Readmission Risk Prevention Plan 01/31/2019  Transportation Screening Complete  Some recent data might be hidden

## 2019-01-31 NOTE — Discharge Instructions (Signed)
Shortness of Breath, Adult Shortness of breath is when a person has trouble breathing enough air or when a person feels like she or he is having trouble breathing in enough air. Shortness of breath could be a sign of a medical problem. Follow these instructions at home:   Pay attention to any changes in your symptoms.  Do not use any products that contain nicotine or tobacco, such as cigarettes, e-cigarettes, and chewing tobacco.  Do not smoke. Smoking is a common cause of shortness of breath. If you need help quitting, ask your health care provider.  Avoid things that can irritate your airways, such as: ? Mold. ? Dust. ? Air pollution. ? Chemical fumes. ? Things that can cause allergy symptoms (allergens), if you have allergies.  Keep your living space clean and free of mold and dust.  Rest as needed. Slowly return to your usual activities.  Take over-the-counter and prescription medicines only as told by your health care provider. This includes oxygen therapy and inhaled medicines.  Keep all follow-up visits as told by your health care provider. This is important. Contact a health care provider if:  Your condition does not improve as soon as expected.  You have a hard time doing your normal activities, even after you rest.  You have new symptoms. Get help right away if:  Your shortness of breath gets worse.  You have shortness of breath when you are resting.  You feel light-headed or you faint.  You have a cough that is not controlled with medicines.  You cough up blood.  You have pain with breathing.  You have pain in your chest, arms, shoulders, or abdomen.  You have a fever.  You cannot walk up stairs or exercise the way that you normally do. These symptoms may represent a serious problem that is an emergency. Do not wait to see if the symptoms will go away. Get medical help right away. Call your local emergency services (911 in the [place] Do not drive yourself  to the hospital. Summary  Shortness of breath is when a person has trouble breathing enough air. It can be a sign of a medical problem.  Avoid things that irritate your lungs, such as smoking, pollution, mold, and dust.  Pay attention to changes in your symptoms and contact your health care provider if you have a hard time completing daily activities because of shortness of breath. This information is not intended to replace advice given to you by your health care provider. Make sure you discuss any questions you have with your health care provider. Document Revised: 06/16/2017 Document Reviewed: 06/16/2017 Elsevier Patient Education  2020 Elsevier Inc.  

## 2019-01-31 NOTE — Consult Note (Signed)
[place] for Warfarin  Indication: atrial fibrillation  No Known Allergies  Patient Measurements: Height: 5\' 10"  (177.8 cm) Weight: 133 lb (60.3 kg) IBW/kg (Calculated) : 73   Vital Signs: Temp: 98.1 F (36.7 C) (01/03 0736) Temp Source: Oral (01/03 0736) BP: 147/104 (01/03 0736) Pulse Rate: 87 (01/03 0736)  Labs: Recent Labs    01/29/19 0508 01/30/19 0404 01/31/19 0633  HGB 10.1*  --   --   HCT 29.8*  --   --   PLT 244  --   --   LABPROT 25.4* 19.5* 16.4*  INR 2.3* 1.7* 1.3*  CREATININE 0.86  --   --     Estimated Creatinine Clearance: 50.6 mL/min (by C-G formula based on SCr of 0.86 mg/dL).   Medications:  Per chart review:  Warfarin 2 mg Sat & Sun  Warfarin 3 mg Mon through Fri  Per patient, last warfarin dose was 2 mg on 12/24 or 12/25 as a result of being told to hold warfarin d/t INR being elevated. He was told to resume 2 mg and was scheduled to have his INR rechecked today.   Assessment: Pharmacy has been consulted for Warfarin dosing in patient presenting with COVID-19 who has a PMH significant for atrial fibrillation. Patient was recently discharged on 12/14 with an elevated INR and discharge instructions indicated for the patient to HOLD warfarin for 2 days and f/u with his PCP.   Per chart Care Everywhere, on 12/22 INR was 4.8; patient was instructed to HOLD warfarin 12/23 and 12/24 then resume 2 mg daily and f/u INR on 12/28.  Previous drug interactions:   12/29 - azithromycin (may have increased INR) 12/30 - cefdinir (also may increase INR)  Date INR  Dose  12/27 3.4 held 12/28 2.6       1.5mg  12/29   4.5       held 12/30   4.8       held 12/31   3.4 0.5 mg 1/1 2.3 1 mg 1/2 1.7 2 mg 1/2       1.3       3 mg  Goal of Therapy:  INR 2-3 Monitor platelets by anticoagulation protocol: Yes   Plan:  INR subtherapeutic - will dose Warfarin 3 mg x 1 dose tonight. CBC stable.   Will check INR daily and CBC  every 3 days.   Oswald Hillock, PharmD, BCPS Clinical Pharmacist 01/31/2019 8:50 AM

## 2019-02-02 ENCOUNTER — Other Ambulatory Visit: Payer: Self-pay

## 2019-02-02 ENCOUNTER — Other Ambulatory Visit: Payer: Medicare Other | Admitting: Nurse Practitioner

## 2019-02-02 ENCOUNTER — Encounter: Payer: Self-pay | Admitting: Nurse Practitioner

## 2019-02-02 DIAGNOSIS — J449 Chronic obstructive pulmonary disease, unspecified: Secondary | ICD-10-CM

## 2019-02-02 DIAGNOSIS — Z515 Encounter for palliative care: Secondary | ICD-10-CM

## 2019-02-02 NOTE — Progress Notes (Signed)
[place] Consult Note Telephone: [phone]  Fax: [phone]  PATIENT NAME: [name] DOB: 09-21-1928 MRN: [id number]  PRIMARY CARE PROVIDER:   Baxter Hire, MD  REFERRING PROVIDER:  Baxter Hire, MD [place],  [place] [address]   Due to the COVID-19 crisis, this visit was done via telemedicine from my office and it was initiated and consent by this patient and or family.  I was asked by Dr Edwina Barth to see [name] for Palliative care consult for goals of care  RESPONSIBLE PARTY:   Self; Daughter, [name] [phone] or [phone]   RECOMMENDATIONS and PLAN:  1. ACP: wishes for DNR; goldenrod completed, placed in Vynca/Epic and mail with blank MOST form and Hard Choice book for review and will revisit next PC visit  2. Weakness, improving, has scheduling in process for home PT/OT. Encourage to rest, sleep hygiene  3.  Dyspnea, continue to monitor respiratory status; O2, nebulizers, pulmonary toileting  4. Palliative care encounter Palliative medicine team will continue to support patient, patient's family, and medical team. Visit consisted of counseling and education dealing with the complex and emotionally intense issues of symptom management and palliative care in the setting of serious and potentially life-threatening illness  I spent 65 minutes providing this consultation,  from 3:45 pm to 5:50pm. More than 50% of the time in this consultation was spent coordinating communication.   HISTORY OF PRESENT ILLNESS:  COLLIE BAMBRICK is a 86 y.o. 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 Hospitalize 12 / 7 / 2020 to 12 / 11 / 2020 Covid positive with acute hypoxic respiratory failure, atrial  fibrillation. He did require oxygen and steroids during hospitalization. Echo was within normal limits. He was controlled with his heart rate on metoprolol and cardizem. He was saying by palliative care during hospitalization. His wife died 12-04-22 after fighting Crohn's disease for 50 years. His daughter does stay with him during the day. He does use a cane. He does remain a full code at this visit but would not want a feeding tube. He was rehospitalized 12 / 58 / 2020 to 1/3 / 2021 for shortness of breath and cough for one day. Workout significant for COPD exacerbation with acute hypoxic respiratory failure with worsening dyspnea, tachypnea several weeks from initial SARS infection, Covid. It appears that he was discharged on home oxygen and prednisone taper, frequent bronchodilators. Previous visit he was a full code but this discharge summary reports do not resuscitate. He was discharged home. I called [name] daughter [name] first schedule telemedicine initial palliative care visit. Video not available with lack of connectivity with internet difficulty. [name] in agreement. [name] and [name] were on speaker. We talked about the last time he was independent which was prior to hospitalization. We talked about past medical history in the setting of chronic disease of COPD. We talked about recent hospitalization, rehospitalization. We talked about [name] now being on oxygen. We talked about symptoms of shortness of breath which he does experience with exertion. [name] endorses the shortness of breath improves with rest. [name] endorse is physical therapy and occupational has not started yet though they are in the process of scheduling the initial appointments. We talked about his appetite which is improving. He has not had any difficulty swallowing or  weight loss. We talked about covid-19. We talked about medical goals of care including aggressive versus conservative versus  comfort care. We talked about MOST form and [name] and agreements have blank most forms sent and hard Choice book for review. We talked about do not resuscitate for which he remains a DNR although they do not have the Goldenrod form at home. [name] in agreement to complete DNR form, play something Cussler epic and mail with blank most form, her choice book. We talked about Life review. We talked about role of palliative care and plan of care. Discuss that will follow up in 4 weeks if needed or Center should he declined. [name] and [name] both in agreement. Appointment scheduled. Therapeutic listening and emotional support provided. Questions answered the satisfaction. Contact information provided. Palliative Care was asked to help address goals of care.   CODE STATUS: DNR  PPS: 50% HOSPICE ELIGIBILITY/DIAGNOSIS: TBD  PAST MEDICAL HISTORY:  Past Medical History:  Diagnosis Date  . Coronary artery disease   . Hypertension     SOCIAL HX:  Social History   Tobacco Use  . Smoking status: Former Research scientist (life sciences)  . Smokeless tobacco: Never Used  Substance Use Topics  . Alcohol use: Never    ALLERGIES: No Known Allergies   PERTINENT MEDICATIONS:  Outpatient Encounter Medications as of 02/02/2019  Medication Sig  . albuterol (PROVENTIL) (2.5 MG/3ML) 0.083% nebulizer solution Take 3 mLs (2.5 mg total) by nebulization every 2 (two) hours as needed for wheezing or shortness of breath.  Marland Kitchen albuterol (PROVENTIL) (2.5 MG/3ML) 0.083% nebulizer solution Take 3 mLs (2.5 mg total) by nebulization every 6 (six) hours as needed for wheezing or shortness of breath.  Marland Kitchen atorvastatin (LIPITOR) 20 MG tablet Take 20 mg by mouth daily.  . bisacodyl (DULCOLAX) 5 MG EC tablet Take 5-15 mg by mouth daily as needed for moderate constipation.  . cetirizine (ZYRTEC) 10 MG tablet Take 10 mg by mouth daily.  Marland Kitchen diltiazem (TIAZAC) 300 MG 24 hr capsule Take 300 mg by mouth daily.  . metoprolol tartrate (LOPRESSOR) 50 MG tablet  Take 50 mg by mouth 2 (two) times daily.  . mometasone (NASONEX) 50 MCG/ACT nasal spray Place 1 spray into the nose daily. Each nostril  . predniSONE (DELTASONE) 10 MG tablet Take 40 mg for 3 days then 30 mg for 3 days then 20 mg for 3 days then 10 mg for 3 days then stop  . Tiotropium Bromide Monohydrate (SPIRIVA RESPIMAT) 1.25 MCG/ACT AERS Inhale 2 sprays into the lungs daily.   . traZODone (DESYREL) 50 MG tablet Take 50 mg by mouth at bedtime as needed for sleep.   Marland Kitchen warfarin (COUMADIN) 2 MG tablet Take 2 mg by mouth daily.    No facility-administered encounter medications on file as of 02/02/2019.    PHYSICAL EXAM:   Deferred  Derrion Tritz Z Ayrton Mcvay, NP

## 2019-02-03 ENCOUNTER — Telehealth: Payer: Self-pay | Admitting: Nurse Practitioner

## 2019-02-03 NOTE — Telephone Encounter (Signed)
I received a message from Freddi Che, Mr. Mcclurg daughter. [name] endorses she and her father further discuss code status. [name] endorses they would like to revisit the code status and get more information about full code vs. DNR. I called Freddi Che and Mr. Shean. We talked on the conference call. We review the palliative care visit, medical goals of care. We talked about code status. We talked about the difference between full code and DNR. We talked about different scenarios. We talked about aggressive interventions versus Comfort Care. Mr Axelson endorses he wishes to remain a do not resuscitate. Will proceed with mailing the Goldenrod form. Therapeutic listening and emotional support provided. Questions answer to satisfaction. Contact information  Total time 30 minutes  Documentation 10 minutes  Phone discussion 20 minutes

## 2019-02-04 ENCOUNTER — Other Ambulatory Visit: Payer: Self-pay | Admitting: Internal Medicine

## 2019-02-08 DIAGNOSIS — D692 Other nonthrombocytopenic purpura: Secondary | ICD-10-CM | POA: Insufficient documentation

## 2019-02-09 ENCOUNTER — Telehealth: Payer: Self-pay | Admitting: Nurse Practitioner

## 2019-02-09 NOTE — Telephone Encounter (Signed)
I called [name], Mr. Marquez daughter to return call. [name] talked about Mr. Woodman with Dr Edwina Barth. We talked about how Mr. Arcos is feeling, slowly improving daily. Mr. Lycett continues to have home health, therapy. We talked about medical goals of care. We talked about option of pulmonary referral for intermit dyspnea. Also talked about intermit edema to BLE, upper extremity. [name] endorses the physical therapist to talk about positioning with body in recliner/bed. We talked about PC. Will keep same f/u PC visit. Therapeutic listening and emotional support provided. Questions answered to satisfaction. Contact information.

## 2019-02-17 ENCOUNTER — Telehealth: Payer: Self-pay | Admitting: Nurse Practitioner

## 2019-02-17 NOTE — Telephone Encounter (Signed)
[name], [name] daughter called for update. [name] endorses [name] has been more tired lately, appears more week. [name] does not feel like getting up in doing anything. He had a recent ball where he hit the back of his head and was checked out by EMT then elected not to seek treatment. [name] endorses that she has had to increase his oxygen as well as nebulizers. The physical therapist will be coming this afternoon and recommended to see how he does with O2 saturation with activity. Discuss with [name] at length best plan to notify Dr Durenda Age office of changes with possible need for a repeat X-ray and CBC. [name] asked about the ability to do mobile chest x-ray, deferred to Dr Edwina Barth. [name] and I talked about endurance, quality of life and importance of early intervention prayers than waiting until later and [name] become very sick. [name] popped about her concern about bringing him into an office and possible covid exposures. Recommended telemedicine visit with Dr. Edwina Barth. We talked about called Dr Tillman Sers office with update Marcello Moores supposed to actually his nurse. [name] an agreement. Therapeutic listening and emotional support provided. Contact information provided. Questions answered to satisfaction.  Total time spent 45 minutes Documentation 10 minutes Phone discussion 35 minutes

## 2019-03-01 ENCOUNTER — Other Ambulatory Visit: Payer: Medicare Other | Admitting: Nurse Practitioner

## 2019-03-01 ENCOUNTER — Encounter: Payer: Self-pay | Admitting: Nurse Practitioner

## 2019-03-01 ENCOUNTER — Other Ambulatory Visit: Payer: Self-pay

## 2019-03-01 DIAGNOSIS — Z515 Encounter for palliative care: Secondary | ICD-10-CM

## 2019-03-01 DIAGNOSIS — J449 Chronic obstructive pulmonary disease, unspecified: Secondary | ICD-10-CM

## 2019-03-01 DIAGNOSIS — I509 Heart failure, unspecified: Secondary | ICD-10-CM

## 2019-03-01 NOTE — Progress Notes (Signed)
[place] Consult Note Telephone: [phone]  Fax: [phone]  PATIENT NAME: [name] DOB: Mar 06, 1928 MRN: [id number]  PRIMARY CARE PROVIDER:   Baxter Hire, MD  REFERRING PROVIDER:  Baxter Hire, MD [place],  [place] [address]   Due to the COVID-19 crisis, this visit was done via telemedicine from my office and it was initiated and consent by this patient and or family.  RESPONSIBLE PARTY:   Self; Daughter, [name] [phone] or [id number]   RECOMMENDATIONS and PLAN:  1. ACP: wishes for DNR; goldenrod completed, placed in Vynca/Epic and mail with blank MOST form and Hard Choice book for review and will revisit next PC visit  2.  Dyspnea, continue to monitor respiratory status; O2, nebulizers, pulmonary toileting  4. Palliative care encounter Palliative medicine team will continue to support patient, patient's family, and medical team. Visit consisted of counseling and education dealing with the complex and emotionally intense issues of symptom management and palliative care in the setting of serious and potentially life-threatening illness  I spent 45 minutes providing this consultation,  from 3:00pm to 3:45pm. More than 50% of the time in this consultation was spent coordinating communication.   HISTORY OF PRESENT ILLNESS:  RYO GARR is a 86 y.o. 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 Hospitalize 12 / 7 / 2020 to 12 / 11 / 2020 Covid positive with acute hypoxic respiratory failure, atrial fibrillation. He did require oxygen and steroids during hospitalization. Echo was within normal limits. He was controlled with his heart rate on metoprolol and cardizem. He was saying by palliative care  during hospitalization. His wife died 12/06/2022 after fighting Crohn's disease for 50 years. His daughter does stay with him during the day. He does use a cane. He does remain a full code at this visit but would not want a feeding tube. He was rehospitalized 12 / 60 / 2020 to 1/3 / 2021 for shortness of breath and cough for one day. Workout significant for COPD exacerbation with acute hypoxic respiratory failure with worsening dyspnea, tachypnea several weeks from initial SARS infection, Covid. It appears that he was discharged on home oxygen and prednisone taper, frequent bronchodilators. Previous visit he was a full code but this discharge summary reports do not resuscitate. He was discharged home. I called [name], Mr. Bage daughter and Mr. Guterrez for palliative care telemedicine visit is video not available. We talked about purpose of palliative care visit and [name] in agreement. We talked about how Mr Bonomi has been feeling. We talked about Mr Calahan visit with Dr. Edwina Barth. We talked about the mobile chest x-ray and chest x-ray in the office. We talked about results. We talked about the recommended plan of increasing Lasix for 7 days. We talked about chronic disease progression. We talked about past medical history with recent events of covid-19, rehospitalization and ongoing symptoms of dyspnea, edema lower extremities. We talked about management. We talked about oxygen. We talked about medications. We talked about Advanced age with progression. We talked about his appetite. We talked about the fluid balance, weights, sodium restriction and rehydration, fluids. We talked about medical goals of care. We talked about role of palliative care and plan of care. We talked about the community Paramedic program. [name] was open to having his chart review to see if  that would be a service he would be eligible for. Discuss with [name] and Mr. Kienitz will have chart reviewed and recontact tomorrow with  further plans and discussion for goals of care. [name] in agreement. Therapeutic listening and emotional support provided. Questions answered to satisfaction. Contact information  Palliative Care was asked to help to continue to address goals of care.   CODE STATUS: DNR  PPS: 50% HOSPICE ELIGIBILITY/DIAGNOSIS: TBD  PAST MEDICAL HISTORY:  Past Medical History:  Diagnosis Date  . Coronary artery disease   . Hypertension     SOCIAL HX:  Social History   Tobacco Use  . Smoking status: Former Research scientist (life sciences)  . Smokeless tobacco: Never Used  Substance Use Topics  . Alcohol use: Never    ALLERGIES: No Known Allergies   PERTINENT MEDICATIONS:  Outpatient Encounter Medications as of 03/01/2019  Medication Sig  . albuterol (PROVENTIL) (2.5 MG/3ML) 0.083% nebulizer solution Take 3 mLs (2.5 mg total) by nebulization every 2 (two) hours as needed for wheezing or shortness of breath.  Marland Kitchen albuterol (PROVENTIL) (2.5 MG/3ML) 0.083% nebulizer solution Take 3 mLs (2.5 mg total) by nebulization every 6 (six) hours as needed for wheezing or shortness of breath.  Marland Kitchen atorvastatin (LIPITOR) 20 MG tablet Take 20 mg by mouth daily.  . bisacodyl (DULCOLAX) 5 MG EC tablet Take 5-15 mg by mouth daily as needed for moderate constipation.  . cetirizine (ZYRTEC) 10 MG tablet Take 10 mg by mouth daily.  Marland Kitchen diltiazem (TIAZAC) 300 MG 24 hr capsule Take 300 mg by mouth daily.  . metoprolol tartrate (LOPRESSOR) 50 MG tablet Take 50 mg by mouth 2 (two) times daily.  . mometasone (NASONEX) 50 MCG/ACT nasal spray Place 1 spray into the nose daily. Each nostril  . predniSONE (DELTASONE) 10 MG tablet Take 40 mg for 3 days then 30 mg for 3 days then 20 mg for 3 days then 10 mg for 3 days then stop  . Tiotropium Bromide Monohydrate (SPIRIVA RESPIMAT) 1.25 MCG/ACT AERS Inhale 2 sprays into the lungs daily.   . traZODone (DESYREL) 50 MG tablet Take 50 mg by mouth at bedtime as needed for sleep.   Marland Kitchen warfarin (COUMADIN) 2 MG tablet Take  2 mg by mouth daily.    No facility-administered encounter medications on file as of 03/01/2019.    PHYSICAL EXAM:   Deferred  Elorah Dewing Z Tamila Gaulin, NP

## 2019-03-02 ENCOUNTER — Telehealth: Payer: Self-pay | Admitting: Family

## 2019-03-02 NOTE — Telephone Encounter (Signed)
Called patient to schedule a New Patient CHF clinic appt after we received referral from pallative care but was unable to reach patient or leave message.      Alyse Low, [place]

## 2019-03-04 NOTE — Progress Notes (Signed)
Patient ID: [name], male    DOB: 12-Dec-1928, 86 y.o.   MRN: [id number]  HPI  [name] is a 86 y/o male with a history of CAD, HTN, previous tobacco use and chronic heart failure.   Echo report from 01/06/2019 reviewed and showed an EF of 55-60% along with mild/ moderate Christopher and moderately elevated PA pressure.   Admitted 01/24/2019 due to COPD exacerbation along with acute on chronic HF. Given solumedrol along with bronchodilators with oral prednisone at discharge. Gave IV lasix for 1 day.  Discharged after 7 days.   He presents today for his initial visit with a chief complaint of minimal shortness of breath upon moderate exertion. He describes this as chronic in nature having been present for several years. He has associated fatigue, cough and pedal edema along with this. He denies any difficulty sleeping, abdominal distention, palpitations, chest pain, wheezing, light-headedness or weight gain.   Past Medical History:  Diagnosis Date  . CHF (congestive heart failure) ([place])   . COPD (chronic obstructive pulmonary disease) ([place])   . Coronary artery disease   . Hypertension    Past Surgical History:  Procedure Laterality Date  . Renal Bypass Surgery     History reviewed. No pertinent family history. Social History   Tobacco Use  . Smoking status: Former Research scientist (life sciences)  . Smokeless tobacco: Never Used  Substance Use Topics  . Alcohol use: Never   No Known Allergies Prior to Admission medications   Medication Sig Start Date End Date Taking? Authorizing Provider  albuterol (PROVENTIL) (2.5 MG/3ML) 0.083% nebulizer solution Take 3 mLs (2.5 mg total) by nebulization every 2 (two) hours as needed for wheezing or shortness of breath. 01/31/19  Yes Danford, Suann Larry, MD  albuterol (PROVENTIL) (2.5 MG/3ML) 0.083% nebulizer solution Take 3 mLs (2.5 mg total) by nebulization every 6 (six) hours as needed for wheezing or shortness of breath. 01/31/19  Yes Danford, Suann Larry, MD   atorvastatin (LIPITOR) 20 MG tablet Take 20 mg by mouth daily.   Yes [provider]  bisacodyl (DULCOLAX) 5 MG EC tablet Take 5-15 mg by mouth daily as needed for moderate constipation.   Yes [provider]  cetirizine (ZYRTEC) 10 MG tablet Take 10 mg by mouth daily. 01/19/19 01/19/20 Yes [provider]  diltiazem (TIAZAC) 300 MG 24 hr capsule Take 300 mg by mouth daily. 09/01/18  Yes [provider]  furosemide (LASIX) 20 MG tablet Take 20 mg by mouth daily.   Yes [provider]  metoprolol tartrate (LOPRESSOR) 50 MG tablet Take 50 mg by mouth 2 (two) times daily.   Yes [provider]  mometasone (NASONEX) 50 MCG/ACT nasal spray Place 1 spray into the nose daily. Each nostril 01/19/19 01/19/20 Yes [provider]  predniSONE (DELTASONE) 10 MG tablet Take 40 mg for 3 days then 30 mg for 3 days then 20 mg for 3 days then 10 mg for 3 days then stop 01/31/19  Yes Danford, Suann Larry, MD  Tiotropium Bromide Monohydrate (SPIRIVA RESPIMAT) 1.25 MCG/ACT AERS Inhale 2 sprays into the lungs daily.  01/14/19  Yes [provider]  traZODone (DESYREL) 50 MG tablet Take 50 mg by mouth at bedtime as needed for sleep.  11/18/18 11/18/19 Yes [provider]  warfarin (COUMADIN) 2 MG tablet Take 2 mg by mouth daily.    Yes [provider]    Review of Systems  Constitutional: Positive for fatigue. Negative for appetite change.  HENT:  Negative for congestion, postnasal drip and sore throat.   Eyes: Negative.   Respiratory: Positive for cough (after using nebulizer) and shortness of breath. Negative for wheezing.   Cardiovascular: Positive for leg swelling. Negative for chest pain.  Gastrointestinal: Negative for abdominal distention and abdominal pain.  Endocrine: Negative.   Genitourinary: Negative.   Musculoskeletal: Negative for back pain and neck pain.  Skin: Negative.   Allergic/Immunologic: Negative.    Neurological: Negative for dizziness and light-headedness.  Hematological: Negative for adenopathy. Does not bruise/bleed easily.  Psychiatric/Behavioral: Negative for dysphoric mood and sleep disturbance. The patient is not nervous/anxious.    Vitals:   03/08/19 1204  BP: (!) 145/78  Pulse: 68  Resp: 16  SpO2: 91%  Weight: 142 lb 6.4 oz (64.6 kg)  Height: 5\' 9"  (1.753 m)   Wt Readings from Last 3 Encounters:  03/08/19 142 lb 6.4 oz (64.6 kg)  01/29/19 133 lb (60.3 kg)  01/08/19 124 lb 3.2 oz (56.3 kg)   Lab Results  Component Value Date   CREATININE 0.86 01/29/2019   CREATININE 0.76 01/28/2019   CREATININE 0.67 01/27/2019    Physical Exam Vitals and nursing note reviewed.  Constitutional:      Appearance: He is well-developed.  HENT:     Head: Normocephalic and atraumatic.  Neck:     Vascular: No JVD.  Cardiovascular:     Rate and Rhythm: Normal rate and regular rhythm.  Pulmonary:     Effort: Pulmonary effort is normal. No respiratory distress.     Breath sounds: No wheezing or rales.  Abdominal:     Palpations: Abdomen is soft.     Tenderness: There is no abdominal tenderness.  Musculoskeletal:     Cervical back: Normal range of motion and neck supple.     Right lower leg: No tenderness. Edema (1+ pitting) present.     Left lower leg: No tenderness. Edema (1+ pitting) present.  Skin:    General: Skin is warm and dry.  Neurological:     General: No focal deficit present.     Mental Status: He is alert and oriented to person, place, and time.  Psychiatric:        Mood and Affect: Mood normal.        Behavior: Behavior normal.     Assessment & Plan:  1: Chronic heart failure with preserved ejection fraction- - NYHA class II - euvolemic today - weighing daily and he says that his weight has been stable; instructed to call for an overnight weight gain of >2 pounds or a weekly weight gain of >5 pounds - not adding salt but does like jr bacon cheeseburger  from [employer] or curly fries from [employer]; reviewed the importance of looking at food labels so that he can keep daily sodium intake to 2000mg  / day; written dietary information and a low sodium cookbook were given to the patient - has PT coming for a couple of weeks - supposed to see cardiology Ubaldo Glassing) April 2021 - palliative care consult done 03/01/19 - BNP 01/25/2019 was 1362.0 - received flu vaccine for this season  2: HTN- - BP looks good today - saw PCP Edwina Barth) 02/25/19 - BMP 01/29/19 reviewed and showed sodium 133, potassium 5.1, creatinine 0.86 and GFR >60  3: COPD- - wearing oxygen at 4L around the clock; does remove to go to the bathroom  4: Lymphedema- - stage 2 - does elevate his legs but admits that he needs to keep them elevated more  often - limited in his ability to exercise due to recent fall - doesn't have compression socks and he was encouraged to get a pair and put them on first thing in the morning with removal at bedtime - consider lymphapress compression boots if edema persists  Patient did not bring his medications nor a list. Each medication was verbally reviewed with the patient and he was encouraged to bring the bottles to every visit to confirm accuracy of list.  Return in 1 month or sooner for any questions/problems before then.

## 2019-03-08 ENCOUNTER — Ambulatory Visit: Payer: Medicare Other | Attending: Family | Admitting: Family

## 2019-03-08 ENCOUNTER — Encounter: Payer: Self-pay | Admitting: Family

## 2019-03-08 ENCOUNTER — Other Ambulatory Visit: Payer: Self-pay

## 2019-03-08 VITALS — BP 145/78 | HR 68 | Resp 16 | Ht 69.0 in | Wt 142.4 lb

## 2019-03-08 DIAGNOSIS — I2510 Atherosclerotic heart disease of native coronary artery without angina pectoris: Secondary | ICD-10-CM | POA: Diagnosis not present

## 2019-03-08 DIAGNOSIS — I10 Essential (primary) hypertension: Secondary | ICD-10-CM

## 2019-03-08 DIAGNOSIS — I5032 Chronic diastolic (congestive) heart failure: Secondary | ICD-10-CM | POA: Diagnosis not present

## 2019-03-08 DIAGNOSIS — R5383 Other fatigue: Secondary | ICD-10-CM | POA: Diagnosis not present

## 2019-03-08 DIAGNOSIS — Z7901 Long term (current) use of anticoagulants: Secondary | ICD-10-CM | POA: Insufficient documentation

## 2019-03-08 DIAGNOSIS — Z87891 Personal history of nicotine dependence: Secondary | ICD-10-CM | POA: Diagnosis not present

## 2019-03-08 DIAGNOSIS — I110 Hypertensive heart disease with heart failure: Secondary | ICD-10-CM | POA: Insufficient documentation

## 2019-03-08 DIAGNOSIS — R0602 Shortness of breath: Secondary | ICD-10-CM | POA: Diagnosis present

## 2019-03-08 DIAGNOSIS — R05 Cough: Secondary | ICD-10-CM | POA: Insufficient documentation

## 2019-03-08 DIAGNOSIS — J42 Unspecified chronic bronchitis: Secondary | ICD-10-CM

## 2019-03-08 DIAGNOSIS — J441 Chronic obstructive pulmonary disease with (acute) exacerbation: Secondary | ICD-10-CM | POA: Diagnosis not present

## 2019-03-08 DIAGNOSIS — Z9981 Dependence on supplemental oxygen: Secondary | ICD-10-CM | POA: Insufficient documentation

## 2019-03-08 DIAGNOSIS — I890 Lymphedema, not elsewhere classified: Secondary | ICD-10-CM

## 2019-03-08 DIAGNOSIS — Z79899 Other long term (current) drug therapy: Secondary | ICD-10-CM | POA: Insufficient documentation

## 2019-03-08 NOTE — Patient Instructions (Addendum)
Continue weighing daily and call for an overnight weight gain of > 2 pounds or a weekly weight gain of >5 pounds.    Begin wearing compression socks daily with removal at bedtime.  

## 2019-03-09 ENCOUNTER — Telehealth (HOSPITAL_COMMUNITY): Payer: Self-pay

## 2019-03-09 NOTE — Telephone Encounter (Signed)
Contacted [place] daughter and discussed the [employer] program with her.  Set up home visit for next week.  Will visit for heart Failure.   [place] [phone]

## 2019-03-16 ENCOUNTER — Other Ambulatory Visit: Payer: Self-pay | Admitting: Family

## 2019-03-16 ENCOUNTER — Other Ambulatory Visit (HOSPITAL_COMMUNITY): Payer: Self-pay

## 2019-03-16 ENCOUNTER — Encounter (HOSPITAL_COMMUNITY): Payer: Self-pay

## 2019-03-16 MED ORDER — FUROSEMIDE 20 MG PO TABS: 20.0000 mg | ORAL_TABLET | Freq: Every day | ORAL | 5 refills | Status: DC
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
## 2019-04-05 ENCOUNTER — Ambulatory Visit: Payer: Medicare Other | Admitting: Family

## 2019-04-05 ENCOUNTER — Encounter: Payer: Self-pay | Admitting: Family

## 2019-04-05 ENCOUNTER — Ambulatory Visit
Admission: RE | Admit: 2019-04-05 | Discharge: 2019-04-05 | Disposition: A | Discharge status: Home / Self Care | Payer: Medicare Other | Source: Ambulatory Visit | Attending: Family | Admitting: Family

## 2019-04-05 ENCOUNTER — Other Ambulatory Visit: Payer: Self-pay

## 2019-04-05 VITALS — BP 139/66 | HR 69 | Resp 16 | Ht 69.0 in | Wt 145.6 lb

## 2019-04-05 DIAGNOSIS — I5032 Chronic diastolic (congestive) heart failure: Secondary | ICD-10-CM | POA: Insufficient documentation

## 2019-04-05 DIAGNOSIS — J1282 Pneumonia due to coronavirus disease 2019: Secondary | ICD-10-CM

## 2019-04-05 DIAGNOSIS — J42 Unspecified chronic bronchitis: Secondary | ICD-10-CM

## 2019-04-05 DIAGNOSIS — I890 Lymphedema, not elsewhere classified: Secondary | ICD-10-CM

## 2019-04-05 DIAGNOSIS — I10 Essential (primary) hypertension: Secondary | ICD-10-CM

## 2019-04-05 LAB — BASIC METABOLIC PANEL
Anion gap: 7 (ref 5–15)
BUN: 26 mg/dL — ABNORMAL HIGH (ref 8–23)
CO2: 32 mmol/L (ref 22–32)
Calcium: 8.8 mg/dL — ABNORMAL LOW (ref 8.9–10.3)
Chloride: 99 mmol/L (ref 98–111)
Creatinine, Ser: 0.95 mg/dL (ref 0.61–1.24)
GFR calc Af Amer: >60 mL/min (ref >60)
GFR calc non Af Amer: >60 mL/min (ref >60)
Glucose, Bld: 107 mg/dL — ABNORMAL HIGH (ref 70–99)
Potassium: 3.8 mmol/L (ref 3.5–5.1)
Sodium: 138 mmol/L (ref 135–145)

## 2019-04-05 NOTE — Patient Instructions (Signed)
Continue weighing daily and call for an overnight weight gain of > 2 pounds or a weekly weight gain of >5 pounds. 

## 2019-04-05 NOTE — Progress Notes (Signed)
Patient ID: [name], male    DOB: 10/17/1928, 86 y.o.   MRN: [id number]  HPI  [name] is a 86 y/o male with a history of CAD, HTN, previous tobacco use and chronic heart failure.   Echo report from 01/06/2019 reviewed and showed an EF of 55-60% along with mild/ moderate Christopher and moderately elevated PA pressure.   Admitted 01/24/2019 due to COPD exacerbation along with acute on chronic HF. Given solumedrol along with bronchodilators with oral prednisone at discharge. Gave IV lasix for 1 day.  Discharged after 7 days.   He presents today for a follow-up visit with a chief complaint of moderate shortness of breath upon minimal exertion. He describes this as chronic in nature having been present for several years. He has associated fatigue, cough and pedal edema along with this. He denies any difficulty sleeping, abdominal distention, palpitations, chest pain, wheezing, dizziness or weight gain.   He says that he "hates" carrying the oxygen tank around but gets anxious if he doesn't have it on. Says that he's wondering if he needs to increase his oxygen because of his shortness of breath.   Past Medical History:  Diagnosis Date  . CHF (congestive heart failure) ([place])   . COPD (chronic obstructive pulmonary disease) ([place])   . Coronary artery disease   . Hypertension    Past Surgical History:  Procedure Laterality Date  . Renal Bypass Surgery     No family history on file. Social History   Tobacco Use  . Smoking status: Former Research scientist (life sciences)  . Smokeless tobacco: Never Used  Substance Use Topics  . Alcohol use: Never   No Known Allergies  Prior to Admission medications   Medication Sig Start Date End Date Taking? Authorizing Provider  albuterol (PROVENTIL) (2.5 MG/3ML) 0.083% nebulizer solution Take 3 mLs (2.5 mg total) by nebulization every 2 (two) hours as needed for wheezing or shortness of breath. 01/31/19  Yes Danford, Suann Larry, MD  albuterol (PROVENTIL) (2.5 MG/3ML)  0.083% nebulizer solution Take 3 mLs (2.5 mg total) by nebulization every 6 (six) hours as needed for wheezing or shortness of breath. 01/31/19  Yes Danford, Suann Larry, MD  atorvastatin (LIPITOR) 20 MG tablet Take 20 mg by mouth daily.   Yes [provider]  bisacodyl (DULCOLAX) 5 MG EC tablet Take 5-15 mg by mouth daily as needed for moderate constipation.   Yes [provider]  cetirizine (ZYRTEC) 10 MG tablet Take 10 mg by mouth daily. 01/19/19 01/19/20 Yes [provider]  diltiazem (TIAZAC) 300 MG 24 hr capsule Take 300 mg by mouth daily. 09/01/18  Yes [provider]  furosemide (LASIX) 20 MG tablet Take 1 tablet (20 mg total) by mouth daily. 03/16/19  Yes [name] A, FNP  metoprolol tartrate (LOPRESSOR) 50 MG tablet Take 50 mg by mouth 2 (two) times daily.   Yes [provider]  mometasone (NASONEX) 50 MCG/ACT nasal spray Place 1 spray into the nose daily. Each nostril 01/19/19 01/19/20 Yes [provider]  potassium chloride (KLOR-CON) 10 MEQ tablet Take 10 mEq by mouth daily.   Yes [provider]  Tiotropium Bromide Monohydrate (SPIRIVA RESPIMAT) 1.25 MCG/ACT AERS Inhale 2 sprays into the lungs daily.  01/14/19  Yes [provider]  traZODone (DESYREL) 50 MG tablet Take 50 mg by mouth at bedtime as needed for sleep.  11/18/18 11/18/19 Yes [provider]  warfarin (COUMADIN) 2 MG tablet Take 2 mg by mouth daily.  Yes [provider]    Review of Systems  Constitutional: Positive for fatigue. Negative for appetite change.  HENT: Negative for congestion, postnasal drip and sore throat.   Eyes: Negative.   Respiratory: Positive for cough (after using nebulizer) and shortness of breath (easily). Negative for wheezing.   Cardiovascular: Positive for leg swelling. Negative for chest pain.  Gastrointestinal: Negative for abdominal distention and abdominal pain.  Endocrine: Negative.    Genitourinary: Negative.   Musculoskeletal: Negative for back pain and neck pain.  Skin: Negative.   Allergic/Immunologic: Negative.   Neurological: Negative for dizziness and light-headedness.  Hematological: Negative for adenopathy. Does not bruise/bleed easily.  Psychiatric/Behavioral: Negative for dysphoric mood and sleep disturbance. The patient is not nervous/anxious.    Vitals:   04/05/19 1229  BP: 139/66  Pulse: 69  Resp: 16  SpO2: 100%  Weight: 145 lb 9.6 oz (66 kg)  Height: 5\' 9"  (1.753 m)   Wt Readings from Last 3 Encounters:  04/05/19 145 lb 9.6 oz (66 kg)  03/16/19 140 lb (63.5 kg)  03/08/19 142 lb 6.4 oz (64.6 kg)     Lab Results  Component Value Date   CREATININE 0.86 01/29/2019   CREATININE 0.76 01/28/2019   CREATININE 0.67 01/27/2019     Physical Exam Vitals and nursing note reviewed.  Constitutional:      Appearance: He is well-developed.  HENT:     Head: Normocephalic and atraumatic.  Neck:     Vascular: No JVD.  Cardiovascular:     Rate and Rhythm: Normal rate and regular rhythm.  Pulmonary:     Effort: Pulmonary effort is normal. No respiratory distress.     Breath sounds: No wheezing or rales.  Abdominal:     Palpations: Abdomen is soft.     Tenderness: There is no abdominal tenderness.  Musculoskeletal:     Cervical back: Normal range of motion and neck supple.     Right lower leg: No tenderness. Edema (1+ pitting) present.     Left lower leg: No tenderness. Edema (1+ pitting) present.  Skin:    General: Skin is warm and dry.  Neurological:     General: No focal deficit present.     Mental Status: He is alert and oriented to person, place, and time.  Psychiatric:        Mood and Affect: Mood normal.        Behavior: Behavior normal.     Assessment & Plan:  1: Chronic heart failure with preserved ejection fraction- - NYHA class III - euvolemic today - weighing daily and he says that his weight has been stable; reminded to  call for an overnight weight gain of >2 pounds or a weekly weight gain of >5 pounds - weight up 3 pounds from last visit from 1 month ago - not adding salt but does like jr bacon cheeseburger from [employer] or curly fries from [employer]; reviewed the importance of looking at food labels so that he can keep daily sodium intake to 2000mg  / day - supposed to see cardiology Ubaldo Glassing) April 2021 - palliative care consult done 03/01/19 - participating in paramedicine program - BNP 01/25/2019 was 1362.0 - received flu vaccine for this season  2: HTN- - BP looks good today - saw PCP Edwina Barth) 03/15/19 - BMP 03/15/19 reviewed and showed sodium 138, potassium 3.4, creatinine 0.8 and GFR 91 - will recheck BMP today as he's recently taken antibiotics and prednisone  3: COPD- - wearing oxygen at 4L  around the clock; does remove to go to the bathroom - will make pulmonology referral; patient prefers [facility] since his PCP and cardiologist are both there; referral to [name] was made for 04/07/19  4: Lymphedema- - stage 2 - does elevate his legs but admits that he needs to keep them elevated more often - limited in his ability to exercise due to recent fall - has compression socks but says that they are very difficult to get them on; daughter that is present with him says that she can put them on when she gets there but it may be late morming - consider lymphapress compression boots if edema persists  5: Pneumonia related to [place]- - had CXR done 03/15/19 @ PCP visit which showed persistent bilateral infiltrates and PCP wanted a repeat done in 2 weeks - CXR done today; if infiltrates persist, may benefit from going to [place] [facility]  Patient did not bring his medications nor a list. Each medication was verbally reviewed with the patient and he was encouraged to bring the bottles to every visit to confirm accuracy of list.  Return in 2 months or sooner for any questions/problems before then.

## 2019-04-06 ENCOUNTER — Other Ambulatory Visit: Payer: Medicare Other | Admitting: Nurse Practitioner

## 2019-04-06 ENCOUNTER — Encounter: Payer: Self-pay | Admitting: Nurse Practitioner

## 2019-04-06 ENCOUNTER — Telehealth (HOSPITAL_COMMUNITY): Payer: Self-pay

## 2019-04-06 ENCOUNTER — Other Ambulatory Visit: Payer: Self-pay

## 2019-04-06 DIAGNOSIS — J449 Chronic obstructive pulmonary disease, unspecified: Secondary | ICD-10-CM

## 2019-04-06 DIAGNOSIS — Z515 Encounter for palliative care: Secondary | ICD-10-CM

## 2019-04-06 NOTE — Progress Notes (Signed)
[facility] Consult Note Telephone: [phone]  Fax: [phone]  PATIENT NAME: [name] DOB: 13-Sep-1928 MRN: [id number]  PRIMARY CARE PROVIDER:   Baxter Hire, MD  REFERRING PROVIDER:  Baxter Hire, MD [place],  [place] [address]   Due to the COVID-19 crisis, this visit was done via telemedicine from my office and it was initiated and consent by this patient and or family.  RESPONSIBLE PARTY:Self; [name] [phone] or [id number]  RECOMMENDATIONS and PLAN: 1.ACP: wishes for DNR; goldenrod completed, placed in Vynca/Epic and mail with blank MOST form and Hard Choice book for review received, will attempt to revisit next PC visit  2. Dyspnea, continue to monitor respiratory status; O2, nebulizers, pulmonary toileting  4.Palliative care encounter Palliative medicine team will continue to support patient, patient's family, and medical team. Visit consisted of counseling and education dealing with the complex and emotionally intense issues of symptom management and palliative care in the setting of serious and potentially life-threatening illness  I spent 45 minutes providing this consultation,  from 1:00pm to 1:45pm. More than 50% of the time in this consultation was spent coordinating communication.   HISTORY OF PRESENT ILLNESS:  [name] is a 86 y.o. 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 Hospitalize 12 / 7 / 2020 to 12 / 11 / 2020 Covid positive with acute hypoxic respiratory failure, atrial fibrillation. He did require oxygen and steroids during hospitalization. Echo was within normal limits. He was controlled with his heart rate on metoprolol and cardizem. He was saying by  palliative care during hospitalization. His wife died December 07, 2022 after fighting Crohn's disease for 50 years. His daughter does stay with him during the day. He does use a cane. He does remain a full code at this visit but would not want a feeding tube. He was rehospitalized 12 / 98 / 2020 to 1/3 / 2021 for shortness of breath and cough for one day. Workout significant for COPD exacerbation with acute hypoxic respiratory failure with worsening dyspnea, tachypnea several weeks from initial SARS infection, Covid. It appears that he was discharged on home oxygen and prednisone taper, frequent bronchodilators. I called Freddi Che, Mr Cotte daughter first scheduled telemedicine is video not available telephonic follow-up palliative care visit. We talked about Mr Cyphert has been doing. [name] endorses Mr Malotte has been very tired, get short of breath easily. Mr. Raga does wake up in the morning but it does take him time to get his breath. Mr. Spiegel continues to be ambulatory. Mr. Forshey had a recent fall. Home Health continues to follow an extended for another 6 weeks due to the following wounds. We talked about Clinical biochemist program in congestive heart failure Clinic. [name] was asking how often Community paramedic does visit. Will have Nile Dear the community paramedic call to review the schedule. Talked about upcoming appointment with pulmonologist Dr Raul Del tomorrow. We talked about questions to ask that [name] has been wondering about. We talked about pending chest x-ray and labs. We talked about covid, hospitalization and lingering symptoms of shortness of breath as well as continuing O2 at 4L. We talked about challenges that prior to hospitalization Mr. Tousant did not require oxygen and now he does. [name] endorses she continues to have difficulty with the company that is managing his oxygen for tank  replacement. [name] endorses Home Health has arranged for case management to  make a visit on Thursday. We talked about purpose of visit and [name] wishes to have some in home help for bathing okay. We talked about option of palliative care social worker. [name] discuss that they've been overwhelmed with all the medical involvement, therapies coming to the home including nursing. We talked about waiting to stay until after the case managers appointment, all her questions may be answered and resources received. We talked about medical goals of care. Talked about expectations. [name] and I talked about making a list of questions for Dr Vella Kohler for tomorrow. We talked about role of palliative care and plan of care. Palliative care to follow up in 1 week after lab, chest x-ray resulted and visit with case management, [name], Heart Failure Clinic appointments completed. Then at that time can regroup medical goals of care with more information received. Mr. Wadlington and [name] both in agreement. Therapeutic listening and emotional support provided. Contact information provided. Questions answered to satisfaction.  Palliative Care was asked to help to continue to address goals of care.   CODE STATUS: DNR PPS: 40% HOSPICE ELIGIBILITY/DIAGNOSIS: TBD  PAST MEDICAL HISTORY:  Past Medical History:  Diagnosis Date  . CHF (congestive heart failure) ([facility])   . COPD (chronic obstructive pulmonary disease) ([facility])   . Coronary artery disease   . Hypertension     SOCIAL HX:  Social History   Tobacco Use  . Smoking status: Former Research scientist (life sciences)  . Smokeless tobacco: Never Used  Substance Use Topics  . Alcohol use: Never    ALLERGIES: No Known Allergies   PERTINENT MEDICATIONS:  Outpatient Encounter Medications as of 04/06/2019  Medication Sig  . albuterol (PROVENTIL) (2.5 MG/3ML) 0.083% nebulizer solution Take 3 mLs (2.5 mg total) by nebulization every 2 (two) hours as needed for wheezing or shortness of breath.  Marland Kitchen albuterol (PROVENTIL) (2.5 MG/3ML) 0.083% nebulizer solution Take 3 mLs (2.5  mg total) by nebulization every 6 (six) hours as needed for wheezing or shortness of breath.  Marland Kitchen atorvastatin (LIPITOR) 20 MG tablet Take 20 mg by mouth daily.  . bisacodyl (DULCOLAX) 5 MG EC tablet Take 5-15 mg by mouth daily as needed for moderate constipation.  . cetirizine (ZYRTEC) 10 MG tablet Take 10 mg by mouth daily.  Marland Kitchen diltiazem (TIAZAC) 300 MG 24 hr capsule Take 300 mg by mouth daily.  . furosemide (LASIX) 20 MG tablet Take 1 tablet (20 mg total) by mouth daily.  . metoprolol tartrate (LOPRESSOR) 50 MG tablet Take 50 mg by mouth 2 (two) times daily.  . mometasone (NASONEX) 50 MCG/ACT nasal spray Place 1 spray into the nose daily. Each nostril  . potassium chloride (KLOR-CON) 10 MEQ tablet Take 10 mEq by mouth daily.  . Tiotropium Bromide Monohydrate (SPIRIVA RESPIMAT) 1.25 MCG/ACT AERS Inhale 2 sprays into the lungs daily.   . traZODone (DESYREL) 50 MG tablet Take 50 mg by mouth at bedtime as needed for sleep.   Marland Kitchen warfarin (COUMADIN) 2 MG tablet Take 2 mg by mouth daily.    No facility-administered encounter medications on file as of 04/06/2019.    PHYSICAL EXAM:   Deferred  Natale Thoma Z [place]ola Holian, NP

## 2019-04-07 NOTE — Telephone Encounter (Signed)
Spoke with [name] today and discussed her Dad [name].  He has been doing ok for standpoint of HF.  She is concerned with his lungs and [name] with HF clinic has referred him to a pulmonologist.  He is still on 4 lpm.  She is still having with oxygen distribution from her current supplier, discussed her switching to [place] where they may furnish her a portable concentrator for going out to make it easier.  They ran out of oxygen with portable bottle going to appt, they used 2 bottles.  Ran out as they were pulling in to the driveway.  He becomes very anxious when oxygen is off.  She states he has not had any chest pain, or fluid problems.  He has been eating ok.  He lives alone and she visits frequently.  She wishes to get more help at his home for him.  Palliative care has been talking to her about possible Hospice if his lungs are not getting any better.  Palliative care also mentioned to her about a covid clinic, we discussed that and gave her the information on location, she was worried about if was at [facility] of the walk they may have to do.  He has all his medications and able to take them as directed.  She does his medication in boxes for him.  Will continue to visit for heart failure.    [place] [phone]

## 2019-04-12 ENCOUNTER — Encounter: Payer: Self-pay | Admitting: Nurse Practitioner

## 2019-04-12 ENCOUNTER — Other Ambulatory Visit: Payer: Self-pay

## 2019-04-12 ENCOUNTER — Other Ambulatory Visit: Payer: Medicare Other | Admitting: Nurse Practitioner

## 2019-04-12 DIAGNOSIS — J449 Chronic obstructive pulmonary disease, unspecified: Secondary | ICD-10-CM

## 2019-04-12 DIAGNOSIS — Z515 Encounter for palliative care: Secondary | ICD-10-CM

## 2019-04-12 NOTE — Progress Notes (Signed)
[facility] Consult Note Telephone: [phone]  Fax: [phone]  PATIENT NAME: [name] DOB: 09-Jun-1928 MRN: [id number]  PRIMARY CARE PROVIDER:   Baxter Hire, MD  REFERRING PROVIDER:  Baxter Hire, MD [place],  [place] [address] Due to the COVID-19 crisis, this visit was done via telemedicine from my office and it was initiated and consent by this patient and or family.  RESPONSIBLE PARTY:Self; Recardo Evangelist [phone] or [id number]  RECOMMENDATIONS and PLAN: 1.ACP: wishes for DNR; goldenrod completed, placed in Vynca/Epic and mail with blank MOST form and Hard Choice book for review and will revisit next PC visit  2. Dyspnea, continue to monitor respiratory status; O2, nebulizers, pulmonary toileting  4.Palliative care encounter Palliative medicine team will continue to support patient, patient's family, and medical team. Visit consisted of counseling and education dealing with the complex and emotionally intense issues of symptom management and palliative care in the setting of serious and potentially life-threatening illness  I spent 45 minutes providing this consultation,  from 12:00pm to 12:45pm. More than 50% of the time in this consultation was spent coordinating communication.   HISTORY OF PRESENT ILLNESS:  [name] is a 86 y.o. 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 Hospitalize 12 / 7 / 2020 to 12 / 11 / 2020 Covid positive with acute hypoxic respiratory failure, atrial fibrillation. He did require oxygen and steroids during hospitalization. Echo was within normal limits. He was controlled with his heart rate on metoprolol and cardizem. He was saying by palliative care  during hospitalization. His wife died 17-Dec-2022 after fighting Crohn's disease for 50 years. His daughter does stay with him during the day. He does use a cane. He does remain a full code at this visit but would not want a feeding tube. He was rehospitalized 12 / 15 / 2020 to 1/3 / 2021 for shortness of breath and cough. Workout significant for COPD exacerbation with acute hypoxic respiratory failure with worsening dyspnea, tachypnea several weeks from initial SARS infection, Covid. I called [name] and his daughter [name] for schedule palliative care follow-up visit. We talked about purpose of visit and [name] in agreement. We talked about how [name] has been feeling. [name] endorses he is feeling some better. [name] had an appointment with [name] the initial pulmonary visit. Dr Raul Del started him on prednisone 10 milligram daily and Trilogy inhaler. We talked about chronic disease progression of COPD with  lung function test resulting in severe COPD. We talked about O2 dependence. [name] endorses he feels like this has helped him. [name] endorse is that he continues to be ambulatory slow with walking, does become short of breath. Shortness of breath does improve with rest. [name] talks about his appetite being good. We talked about medical goals of care. We talked about Clinical biochemist program and Congestive Heart Failure Clinic continues to follow Christopher. Good. We talked about continuing to take one day at a time. [name] talked about her difficulties with [employer] for his oxygen though Dr Raul Del was able to straighten that out. We talked about follow up palliative care visit in 4 weeks if needed or sooner if decline. [name] and [name] in agreement. Appointment scheduled. Questions answered to satisfaction. Therapeutic listening and emotional support provided. Palliative Care  was asked to help to continue to address goals of care.   CODE  STATUS: DNR  PPS: 40% HOSPICE ELIGIBILITY/DIAGNOSIS: TBD  PAST MEDICAL HISTORY:  Past Medical History:  Diagnosis Date  . CHF (congestive heart failure) ([employer])   . COPD (chronic obstructive pulmonary disease) ([facility])   . Coronary artery disease   . Hypertension     SOCIAL HX:  Social History   Tobacco Use  . Smoking status: Former Research scientist (life sciences)  . Smokeless tobacco: Never Used  Substance Use Topics  . Alcohol use: Never    ALLERGIES: No Known Allergies   PERTINENT MEDICATIONS:  Outpatient Encounter Medications as of 04/12/2019  Medication Sig  . albuterol (PROVENTIL) (2.5 MG/3ML) 0.083% nebulizer solution Take 3 mLs (2.5 mg total) by nebulization every 2 (two) hours as needed for wheezing or shortness of breath.  Marland Kitchen albuterol (PROVENTIL) (2.5 MG/3ML) 0.083% nebulizer solution Take 3 mLs (2.5 mg total) by nebulization every 6 (six) hours as needed for wheezing or shortness of breath.  Marland Kitchen atorvastatin (LIPITOR) 20 MG tablet Take 20 mg by mouth daily.  . bisacodyl (DULCOLAX) 5 MG EC tablet Take 5-15 mg by mouth daily as needed for moderate constipation.  . cetirizine (ZYRTEC) 10 MG tablet Take 10 mg by mouth daily.  Marland Kitchen diltiazem (TIAZAC) 300 MG 24 hr capsule Take 300 mg by mouth daily.  . furosemide (LASIX) 20 MG tablet Take 1 tablet (20 mg total) by mouth daily.  . metoprolol tartrate (LOPRESSOR) 50 MG tablet Take 50 mg by mouth 2 (two) times daily.  . mometasone (NASONEX) 50 MCG/ACT nasal spray Place 1 spray into the nose daily. Each nostril  . potassium chloride (KLOR-CON) 10 MEQ tablet Take 10 mEq by mouth daily.  . Tiotropium Bromide Monohydrate (SPIRIVA RESPIMAT) 1.25 MCG/ACT AERS Inhale 2 sprays into the lungs daily.   . traZODone (DESYREL) 50 MG tablet Take 50 mg by mouth at bedtime as needed for sleep.   Marland Kitchen warfarin (COUMADIN) 2 MG tablet Take 2 mg by mouth daily.    No facility-administered encounter medications on file as of 04/12/2019.    PHYSICAL EXAM:   Deferred  Ainsleigh Kakos Z  Safir Michalec, NP

## 2019-04-13 ENCOUNTER — Other Ambulatory Visit (HOSPITAL_COMMUNITY): Payer: Self-pay

## 2019-04-13 ENCOUNTER — Encounter (HOSPITAL_COMMUNITY): Payer: Self-pay

## 2019-04-13 NOTE — Progress Notes (Signed)
Had a home visit with Edric today.  He appears to be better than last visit.  He states able to cough more freely.  Still on oxygen and doctor advised him to bump it up when moving around.  He had a good visit with pulmonology Pt now taking trelegy and appears to be having improvement with it.  He has all his medications and daughter takes care of them for him.  He is aware of how to take them once she places them in a med box.  He lives alone with his daughter checking on him often.  He weighs daily.  His weight has been staying the same.  He has some edema in lower legs and abdomen is soft.  He denies adding salt to anything, he watches fluids he drinks during the day.  He has everything for everyday living.  His daughter has someone coming in to give him baths every week.  His oxygen company has provided him with a portable oxygen now.  He was having trouble with nebulizer holding it so brought him a mask for it and advised him how to use it.  Will continue to visit for heart failure.   [place] [phone]

## 2019-05-06 ENCOUNTER — Other Ambulatory Visit: Payer: Medicare Other | Admitting: Nurse Practitioner

## 2019-05-06 ENCOUNTER — Other Ambulatory Visit: Payer: Self-pay

## 2019-05-06 ENCOUNTER — Encounter: Payer: Self-pay | Admitting: Nurse Practitioner

## 2019-05-06 DIAGNOSIS — J449 Chronic obstructive pulmonary disease, unspecified: Secondary | ICD-10-CM

## 2019-05-06 DIAGNOSIS — Z515 Encounter for palliative care: Secondary | ICD-10-CM

## 2019-05-06 NOTE — Progress Notes (Signed)
[facility] Consult Note Telephone: [phone]  Fax: [phone]  PATIENT NAME: [name] DOB: 07/29/1928 MRN: [id number]  PRIMARY CARE PROVIDER:   Baxter Hire, MD  REFERRING PROVIDER:  Baxter Hire, MD [place],  [place] [address]  Due to the COVID-19 crisis, this visit was done via telemedicine from my office and it was initiated and consent by this patient and or family.  RESPONSIBLE PARTY:Self; [name] [phone] or [id number]  RECOMMENDATIONS and PLAN: 1.ACP: wishes for DNR; goldenrod completed, placed in Vynca/Epic and mail with blank MOST form and Hard Choice book for review and will revisit next PC visit  2. Dyspnea, continue to monitor respiratory status; O2, nebulizers, pulmonary toileting  3.Palliative care encounter Palliative medicine team will continue to support patient, patient's family, and medical team. Visit consisted of counseling and education dealing with the complex and emotionally intense issues of symptom management and palliative care in the setting of serious and potentially life-threatening illness  I spent 55 minutes providing this consultation,  from 12:00pm to 12:55pm. More than 50% of the time in this consultation was spent coordinating communication.   HISTORY OF PRESENT ILLNESS:  [name] is a 86 y.o. 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 Hospitalize 12 / 7 / 2020 to 12 / 11 / 2020 Covid positive with acute hypoxic respiratory failure, atrial fibrillation. He did require oxygen and steroids during hospitalization. Echo was within normal limits. He was controlled with his heart rate on metoprolol and cardizem. He was saying by palliative care  during hospitalization. His wife died Dec 15, 2022 after fighting Crohn's disease for 50 years. His daughter does stay with him during the day. He does use a cane. He does remain a full code at this visit but would not want a feeding tube. He was rehospitalized 12 / 22 / 2020 to 1/3 / 2021 for shortness of breath and cough. Workout significant for COPD exacerbation with acute hypoxic respiratory failure with worsening dyspnea, tachypnea several weeks from initial SARS infection, Covid. I called [name], [name] daughter and [name] on speaker for follow-up palliative care telemedicine is video not available visit. We talked about purpose of palliative care visit. We talked about how [name] has been feeling. [name] endorsed [name] has been ambulating within the home short distances and remaining on 3 liters of oxygen. Mr. Roblyer endorses he is doing okay and breathing with Trelogy inhaler. We talked about upcoming appointment with Dr Raul Del Pulmonologist tomorrow. [name] talked at length about the challenges with the portable oxygen and working with the [place]. [name] endorses she will follow up with Dr Raul Del tomorrow about oxygen tanks. We talked Mr. Chausse functional ability. Mr. Purdum endorses he does not go out of the house he stays at home. We talked about shortness of breath what she does experience but is improved. We talked about his appetite which is good. [name] endorses that he did lose two pounds though more so edema as his appetite has been better. We talked about recent visit with [employer]. We talked about medical goals of care. We talked about role of palliative care and plan of care. Discuss follow-up palliative care visit in 6 weeks if needed or sooner should [name] look fine. [name] and [name] both in  agreement. Therapeutic listening and emotional support provided. Contact information. Questions answered to  satisfaction.   Palliative Care was asked to help to continue to address goals of care.   CODE STATUS: DNR  PPS: 40% HOSPICE ELIGIBILITY/DIAGNOSIS: TBD  PAST MEDICAL HISTORY:  Past Medical History:  Diagnosis Date  . CHF (congestive heart failure) ([facility])   . COPD (chronic obstructive pulmonary disease) ([facility])   . Coronary artery disease   . Hypertension     SOCIAL HX:  Social History   Tobacco Use  . Smoking status: Former Research scientist (life sciences)  . Smokeless tobacco: Never Used  Substance Use Topics  . Alcohol use: Never    ALLERGIES: No Known Allergies   PERTINENT MEDICATIONS:  Outpatient Encounter Medications as of 05/06/2019  Medication Sig  . albuterol (PROVENTIL) (2.5 MG/3ML) 0.083% nebulizer solution Take 3 mLs (2.5 mg total) by nebulization every 2 (two) hours as needed for wheezing or shortness of breath.  Marland Kitchen albuterol (PROVENTIL) (2.5 MG/3ML) 0.083% nebulizer solution Take 3 mLs (2.5 mg total) by nebulization every 6 (six) hours as needed for wheezing or shortness of breath.  Marland Kitchen atorvastatin (LIPITOR) 20 MG tablet Take 20 mg by mouth daily.  . bisacodyl (DULCOLAX) 5 MG EC tablet Take 5-15 mg by mouth daily as needed for moderate constipation.  . cetirizine (ZYRTEC) 10 MG tablet Take 10 mg by mouth daily.  Marland Kitchen diltiazem (TIAZAC) 300 MG 24 hr capsule Take 300 mg by mouth daily.  . furosemide (LASIX) 20 MG tablet Take 1 tablet (20 mg total) by mouth daily.  . metoprolol tartrate (LOPRESSOR) 50 MG tablet Take 50 mg by mouth 2 (two) times daily.  . mometasone (NASONEX) 50 MCG/ACT nasal spray Place 1 spray into the nose daily. Each nostril  . potassium chloride (KLOR-CON) 10 MEQ tablet Take 10 mEq by mouth daily.  . Tiotropium Bromide Monohydrate (SPIRIVA RESPIMAT) 1.25 MCG/ACT AERS Inhale 2 sprays into the lungs daily.   . traZODone (DESYREL) 50 MG tablet Take 50 mg by mouth at bedtime as needed for sleep.   Marland Kitchen warfarin (COUMADIN) 2 MG tablet Take 2 mg by mouth daily.    No  facility-administered encounter medications on file as of 05/06/2019.    PHYSICAL EXAM:  deferred Tejasvi Brissett Ihor Gully, NP

## 2019-05-12 ENCOUNTER — Other Ambulatory Visit (HOSPITAL_COMMUNITY): Payer: Self-pay

## 2019-05-12 ENCOUNTER — Encounter (HOSPITAL_COMMUNITY): Payer: Self-pay

## 2019-05-12 NOTE — Progress Notes (Signed)
Today had a home visit with Von.  He states he is getting better.  His oxygen was decreased to 2 lpm now all the time.  Sats are 97%, he states has been up to 100%.  He walked around without his oxygen and sats dropped to 91%.  Advised him to keep using oxygen at this time.  He has all his medications and he is taking Trelegy, prednisone 5 mg.  After 30 days he is suppose to stop the prednisone.  He still has some pneumonia and taking antibiotic that will finish in 2 days.  Lungs sounds clear.  He states after neb treatments he will cough up globs of junk.  He states was hurting at his ribs on his left side during the night, he states strained trying to cough before bed last night.  He put some muscle cream on it this morning and now is better.  Advised him he could have pulled a muscle straining, non tender to palpation and ribs feel intact and non tender.  He has no swelling in lower extremities. Abdomen does not feel tight. He states does not sit more than 2 hours at a time, he stays busy in the house.  He still having trouble sleeping.  He states the sleeping pill does not work so he does not take it.  He watches what he eats and how much fluid he drinks.  Very stable walking around the house without walker.  He weighs daily.  He mostly stays at home right now.  He would like to get back driving.  Daughter checks on him daily.  Home health has 2 appts left and he is with [facility].  Will continue to visit for Heart Failure.  [place] [phone]

## 2019-05-26 NOTE — Progress Notes (Signed)
Patient ID: DOHN WEIDERT, male    DOB: 09-Sep-1928, 86 y.o.   MRN: [id number]  HPI  Mr Wiles is a 86 y/o male with a history of CAD, HTN, previous tobacco use and chronic heart failure.   Echo report from 01/06/2019 reviewed and showed an EF of 55-60% along with mild/ moderate MR and moderately elevated PA pressure.   Admitted 01/24/2019 due to COPD exacerbation along with acute on chronic HF. Given solumedrol along with bronchodilators with oral prednisone at discharge. Gave IV lasix for 1 day.  Discharged after 7 days.   He presents today for a follow-up visit with a chief complaint of minimal shortness of breath upon moderate exertion. He describes this as chronic in nature having been present for several years. He has associated fatigue, productive cough and weight loss (due to recent GI illness) along with this. He denies any difficulty sleeping, dizziness, abdominal distention, palpitations, pedal edema, chest pain, wheezing or weight gain.   Oxygen is being weaned and is now down to 2L . He does take it off and walk around the house for a short period of time and says that it'll drop down to upper 80's but then quickly rises once he puts the oxygen back on.   Past Medical History:  Diagnosis Date  . CHF (congestive heart failure) ([place])   . COPD (chronic obstructive pulmonary disease) ([place])   . Coronary artery disease   . Hypertension    Past Surgical History:  Procedure Laterality Date  . Renal Bypass Surgery     No family history on file. Social History   Tobacco Use  . Smoking status: Former Research scientist (life sciences)  . Smokeless tobacco: Never Used  Substance Use Topics  . Alcohol use: Never   No Known Allergies  Prior to Admission medications   Medication Sig Start Date End Date Taking? Authorizing Provider  albuterol (PROVENTIL) (2.5 MG/3ML) 0.083% nebulizer solution Take 3 mLs (2.5 mg total) by nebulization every 2 (two) hours as needed for wheezing or shortness of breath.  01/31/19  Yes Danford, Suann Larry, MD  albuterol (PROVENTIL) (2.5 MG/3ML) 0.083% nebulizer solution Take 3 mLs (2.5 mg total) by nebulization every 6 (six) hours as needed for wheezing or shortness of breath. 01/31/19  Yes Danford, Suann Larry, MD  atorvastatin (LIPITOR) 20 MG tablet Take 20 mg by mouth daily.   Yes [provider]  bisacodyl (DULCOLAX) 5 MG EC tablet Take 5-15 mg by mouth daily as needed for moderate constipation.   Yes [provider]  cetirizine (ZYRTEC) 10 MG tablet Take 10 mg by mouth daily. 01/19/19 01/19/20 Yes [provider]  diltiazem (TIAZAC) 300 MG 24 hr capsule Take 300 mg by mouth daily. 09/01/18  Yes [provider]  Fluticasone-Umeclidin-Vilant (TRELEGY ELLIPTA) 100-62.5-25 MCG/INH AEPB Inhale 1 puff into the lungs daily.    Yes [provider]  furosemide (LASIX) 20 MG tablet Take 1 tablet (20 mg total) by mouth daily. 03/16/19  Yes [name] A, FNP  metoprolol tartrate (LOPRESSOR) 50 MG tablet Take 50 mg by mouth 2 (two) times daily.   Yes [provider]  mometasone (NASONEX) 50 MCG/ACT nasal spray Place 1 spray into the nose daily. Each nostril 01/19/19 01/19/20 Yes [provider]  potassium chloride (KLOR-CON) 10 MEQ tablet Take 10 mEq by mouth daily.   Yes [provider]  predniSONE (DELTASONE) 10 MG tablet Take 5 mg by mouth daily with breakfast.   Yes [provider]  Tiotropium Bromide Monohydrate (SPIRIVA RESPIMAT) 1.25 MCG/ACT AERS Inhale 2 sprays into the lungs as needed.  01/14/19  Yes [provider]  traZODone (DESYREL) 50 MG tablet Take 50 mg by mouth at bedtime as needed for sleep.  11/18/18 11/18/19 Yes [provider]  warfarin (COUMADIN) 2 MG tablet Take 2 mg by mouth daily.    Yes [provider]     Review of Systems  Constitutional: Positive for fatigue. Negative for appetite change.  HENT: Negative for congestion, postnasal drip  and sore throat.   Eyes: Negative.   Respiratory: Positive for cough (after using nebulizer) and shortness of breath (with moderate exertion). Negative for wheezing.   Cardiovascular: Negative for chest pain, palpitations and leg swelling.  Gastrointestinal: Negative for abdominal distention and abdominal pain.  Endocrine: Negative.   Genitourinary: Negative.   Musculoskeletal: Negative for back pain and neck pain.  Skin: Negative.   Allergic/Immunologic: Negative.   Neurological: Negative for dizziness and light-headedness.  Hematological: Negative for adenopathy. Does not bruise/bleed easily.  Psychiatric/Behavioral: Negative for dysphoric mood and sleep disturbance (sleeping on 2 pillows). The patient is not nervous/anxious.    Vitals:   05/31/19 1255  BP: (!) 126/52  Pulse: (!) 48  Resp: 16  SpO2: 100%  Weight: 136 lb 2 oz (61.7 kg)  Height: 5\' 10"  (1.778 m)   Wt Readings from Last 3 Encounters:  05/31/19 136 lb 2 oz (61.7 kg)  05/12/19 143 lb (64.9 kg)  04/13/19 145 lb (65.8 kg)   Lab Results  Component Value Date   CREATININE 0.95 04/05/2019   CREATININE 0.86 01/29/2019   CREATININE 0.76 01/28/2019    Physical Exam Vitals and nursing note reviewed.  Constitutional:      Appearance: He is well-developed.  HENT:     Head: Normocephalic and atraumatic.  Neck:     Vascular: No JVD.  Cardiovascular:     Rate and Rhythm: Normal rate and regular rhythm.  Pulmonary:     Effort: Pulmonary effort is normal. No respiratory distress.     Breath sounds: No wheezing or rales.  Abdominal:     Palpations: Abdomen is soft.     Tenderness: There is no abdominal tenderness.  Musculoskeletal:     Cervical back: Normal range of motion and neck supple.     Right lower leg: No tenderness. No edema.     Left lower leg: No tenderness. No edema.  Skin:    General: Skin is warm and dry.  Neurological:     General: No focal deficit present.     Mental Status: He is alert and  oriented to person, place, and time.  Psychiatric:        Mood and Affect: Mood normal.        Behavior: Behavior normal.     Assessment & Plan:  1: Chronic heart failure with preserved ejection fraction- - NYHA class II - euvolemic today - weighing daily; reminded to call for an overnight weight gain of >2 pounds or a weekly weight gain of >5 pounds - weight down 9 pounds from last visit here 2 months ago (had recent GI illness/ possible food poisoning) - not adding salt but does like jr bacon cheeseburger from [employer] or curly fries from [employer]; reviewed the importance of looking at food labels so that he can keep daily sodium intake to 2000mg  / day - saw cardiology Minette Brine) 05/04/19 - saw palliative care 05/06/19 - participating in paramedicine program - BNP 01/25/2019 was  1362.0  2: HTN- - BP looks good today - saw PCP Edwina Barth) 03/15/19 - BMP 04/05/19 reviewed and showed sodium 138, potassium 3.8, creatinine 0.95 and GFR >60  3: COPD- - wearing oxygen at 2L around the clock; does remove to go to the bathroom - saw pulmonology Raul Del) 05/07/19  4: Lymphedema- - stage 2 - resolved   Medication bottles reviewed.  Return in 4 months or sooner for any questions/problems before then.

## 2019-05-31 ENCOUNTER — Encounter: Payer: Self-pay | Admitting: Family

## 2019-05-31 ENCOUNTER — Other Ambulatory Visit: Payer: Self-pay

## 2019-05-31 ENCOUNTER — Ambulatory Visit: Payer: Medicare Other | Attending: Family | Admitting: Family

## 2019-05-31 VITALS — BP 126/52 | HR 48 | Resp 16 | Ht 70.0 in | Wt 136.1 lb

## 2019-05-31 DIAGNOSIS — J449 Chronic obstructive pulmonary disease, unspecified: Secondary | ICD-10-CM | POA: Diagnosis not present

## 2019-05-31 DIAGNOSIS — Z87891 Personal history of nicotine dependence: Secondary | ICD-10-CM | POA: Diagnosis not present

## 2019-05-31 DIAGNOSIS — I2510 Atherosclerotic heart disease of native coronary artery without angina pectoris: Secondary | ICD-10-CM | POA: Diagnosis not present

## 2019-05-31 DIAGNOSIS — Z7901 Long term (current) use of anticoagulants: Secondary | ICD-10-CM | POA: Insufficient documentation

## 2019-05-31 DIAGNOSIS — Z7952 Long term (current) use of systemic steroids: Secondary | ICD-10-CM | POA: Diagnosis not present

## 2019-05-31 DIAGNOSIS — Z7951 Long term (current) use of inhaled steroids: Secondary | ICD-10-CM | POA: Insufficient documentation

## 2019-05-31 DIAGNOSIS — I890 Lymphedema, not elsewhere classified: Secondary | ICD-10-CM

## 2019-05-31 DIAGNOSIS — I5032 Chronic diastolic (congestive) heart failure: Secondary | ICD-10-CM | POA: Diagnosis present

## 2019-05-31 DIAGNOSIS — I10 Essential (primary) hypertension: Secondary | ICD-10-CM

## 2019-05-31 DIAGNOSIS — J42 Unspecified chronic bronchitis: Secondary | ICD-10-CM

## 2019-05-31 DIAGNOSIS — I110 Hypertensive heart disease with heart failure: Secondary | ICD-10-CM | POA: Diagnosis not present

## 2019-05-31 DIAGNOSIS — Z79899 Other long term (current) drug therapy: Secondary | ICD-10-CM | POA: Diagnosis not present

## 2019-05-31 NOTE — Patient Instructions (Signed)
Continue weighing daily and call for an overnight weight gain of > 2 pounds or a weekly weight gain of >5 pounds. 

## 2019-06-10 ENCOUNTER — Telehealth (HOSPITAL_COMMUNITY): Payer: Self-pay

## 2019-06-10 ENCOUNTER — Other Ambulatory Visit: Payer: Medicare Other | Admitting: Nurse Practitioner

## 2019-06-10 ENCOUNTER — Encounter: Payer: Self-pay | Admitting: Nurse Practitioner

## 2019-06-10 ENCOUNTER — Other Ambulatory Visit: Payer: Self-pay

## 2019-06-10 DIAGNOSIS — Z515 Encounter for palliative care: Secondary | ICD-10-CM

## 2019-06-10 DIAGNOSIS — J449 Chronic obstructive pulmonary disease, unspecified: Secondary | ICD-10-CM

## 2019-06-10 NOTE — Telephone Encounter (Signed)
Today had a telephone appt.  Spoke with his daughter and she states he has been doing good.  Been working with PT and they have started walking outside without oxygen.  His breathing has been improving, he still wears his oxygen but takes it off when he goes walking.  He stays active in the house, he makes laps around the house inside.  She places his meds in his box for him.  He is aware of how to take his medications.  He lives alone but his daughter visits almost everyday.  He is part of Palliative Care and has home visit today.  He has everything for everyday living.  He watches what he eats and drinks.  Weighs daily and weight has been staying same since last visit.  He is wanting to come off his oxygen, he was started on it when he had covid.  They are both aware of up coming appts.  Will make a home visit next appt. Will visit for heart failure.   [place] [phone]

## 2019-06-10 NOTE — Progress Notes (Signed)
[facility] Consult Note Telephone: [phone]  Fax: [phone]  PATIENT NAME: [name] DOB: 06-22-1928 MRN: [id number]  PRIMARY CARE PROVIDER:   Baxter Hire, MD  REFERRING PROVIDER:  Baxter Hire, MD [place],  [place] [id number]  RESPONSIBLE PARTY:Self; Daughter, [name] [phone] or [phone]  RECOMMENDATIONS and PLAN: 1.ACP: wishes for DNR; in vynca; will revisit MOST form next visit  2. Dyspnea, continue to monitor respiratory status; O2, nebulizers, pulmonary toileting  3.Palliative care encounter Palliative medicine team will continue to support patient, patient's family, and medical team. Visit consisted of counseling and education dealing with the complex and emotionally intense issues of symptom management and palliative care in the setting of serious and potentially life-threatening illness  I spent 90 minutes providing this consultation,  from 3:30pm to 5:00pm. More than 50% of the time in this consultation was spent coordinating communication.   HISTORY OF PRESENT ILLNESS:  [name] is a 86 y.o. 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 Hospitalize 12 / 7 / 2020 to 12 / 11 / 2020 Covid positive with acute hypoxic respiratory failure, atrial fibrillation. He did require oxygen and steroids during hospitalization. Echo was within normal limits. He was controlled with his heart rate on metoprolol and cardizem. He was saying by palliative care during hospitalization. His wife died 12/15/22 after fighting Crohn's disease for 50 years. His daughter does stay with him during the day. He does use a cane. He does remain a full code at this visit but would not want a feeding tube.  He was rehospitalized 12 / 48 / 2020 to 1/3 / 2021 for shortness of breath and cough. Workout significant for COPD exacerbation with acute hypoxic respiratory failure with worsening dyspnea, tachypnea several weeks from initial SARS infection, Covid.In-person palliative care follow-up visit with [name] and daughter [name]. We talked about palliative care visit. [name] and [name] and agreement. We talked about how [name] has been doing. [name] has been feeling much better. We talked about symptoms of shortness of breath has a remains on 2 liters of oxygen. We talked about importance of rest. We talked about napping. We talked about appetite. We talked about weight. We talked about Trelogy as he has ran out three days ago. We talked about the importance of relaying information when he's getting ready to run out of medication. Stressed the importance of his Trilogy. [name] endorse is that he does not like to take the Trelogy because it leaves a bad taste in his mouth. Oatmeal cookie does help get that taste out of his mouth. We talked about importance of Trelogy, the significance of improvement that it did for him. We talked about upcoming appointment with [name], pulmonology. We talked about the trouble that they had with continuous oxygen portable tank. We talked about medical goals of care. [name] is a DNR. We talked about his functional ability walking with a walker, no recent Falls. [name] has been working with therapy and he has few more sessions of therapy before being discharged. We talked about importance of strengthening, continuing to walk exercise, endurance. We talked about using the incentive spirometer and flutter valve, exercising his lungs. We talked about chronic disease progression in the setting of advanced aging of 86 years old. We talked about  realistic expectations. We talked about taking one day at a time. We talked about role of palliative care  and plan of care. Schedule follow-up palliative care visit and 2 months if needed or sooner should he declined. [name] and Christopher. Thimm in agreement. Therapeutic listening and emotional support provided. Questions answer to satisfaction. Contact information. [name] and I talked separately from [name]. We talked about concern for chronic disease progression, [name] being 86 years old and preparing for goals of care, which is how they want the end part of his life to go. We talked about quality of life versus quantity of days. We talked about having a conversation with [name] about having to return to the hospital if he needs to versus home with Hospice. [name] talked at length about [name] wife her mother and her chronic illness. [name] and daughter says her mother was chronically ill since the 1970s, ended up in [facility] and discharged home with hospice only to live a few days [name] talked at length about [name] wife her mother and her chronic illness. [name] and daughter says her mother was chronically ill since the 1970s, ended up in [facility] and discharged home with hospice only to live a few days. [name] endorses that was this past October and then short after [name] had his back-to-back hospitalizations, covid and continuous oxygen, slow progression. We talked about stressors that were involved in the passing of [name] wife. We talked about events happening one right after the other within this last year. We talked about ongoing discussions of medical goals of care. We talked about Christopher. Good living independently and What that would look like should he decline in his disease processes. [name] and I talked about ongoing discussions with Christopher. Good about said he did fine how he wants that to look like. [name] in agreement and will continue discussions with Christopher. Limmie Good. We talked about role of palliative care. Questions answered.  Palliative Care  was asked to help to continue to address goals of care.   CODE STATUS: DNR  PPS: 40% HOSPICE ELIGIBILITY/DIAGNOSIS: TBD  PAST MEDICAL HISTORY:  Past Medical History:  Diagnosis Date  . CHF (congestive heart failure) ([place])   . COPD (chronic obstructive pulmonary disease) ([place])   . Coronary artery disease   . Hypertension     SOCIAL HX:  Social History   Tobacco Use  . Smoking status: Former Research scientist (life sciences)  . Smokeless tobacco: Never Used  Substance Use Topics  . Alcohol use: Never    ALLERGIES: No Known Allergies   PERTINENT MEDICATIONS:  Outpatient Encounter Medications as of 06/10/2019  Medication Sig  . albuterol (PROVENTIL) (2.5 MG/3ML) 0.083% nebulizer solution Take 3 mLs (2.5 mg total) by nebulization every 2 (two) hours as needed for wheezing or shortness of breath.  Marland Kitchen albuterol (PROVENTIL) (2.5 MG/3ML) 0.083% nebulizer solution Take 3 mLs (2.5 mg total) by nebulization every 6 (six) hours as needed for wheezing or shortness of breath.  Marland Kitchen atorvastatin (LIPITOR) 20 MG tablet Take 20 mg by mouth daily.  . bisacodyl (DULCOLAX) 5 MG EC tablet Take 5-15 mg by mouth daily as needed for moderate constipation.  . cetirizine (ZYRTEC) 10 MG tablet Take 10 mg by mouth daily.  Marland Kitchen diltiazem (TIAZAC) 300 MG 24 hr capsule Take 300 mg by mouth daily.  . Fluticasone-Umeclidin-Vilant (TRELEGY ELLIPTA) 100-62.5-25 MCG/INH AEPB Inhale 1 puff into the lungs daily.   . furosemide (LASIX) 20 MG tablet Take 1 tablet (20  mg total) by mouth daily.  . metoprolol tartrate (LOPRESSOR) 50 MG tablet Take 50 mg by mouth 2 (two) times daily.  . mometasone (NASONEX) 50 MCG/ACT nasal spray Place 1 spray into the nose daily. Each nostril  . potassium chloride (KLOR-CON) 10 MEQ tablet Take 10 mEq by mouth daily.  . predniSONE (DELTASONE) 10 MG tablet Take 5 mg by mouth daily with breakfast.  . Tiotropium Bromide Monohydrate (SPIRIVA RESPIMAT) 1.25 MCG/ACT AERS Inhale 2 sprays into the lungs as needed.   . traZODone  (DESYREL) 50 MG tablet Take 50 mg by mouth at bedtime as needed for sleep.   Marland Kitchen warfarin (COUMADIN) 2 MG tablet Take 2 mg by mouth daily.    No facility-administered encounter medications on file as of 06/10/2019.    PHYSICAL EXAM:   General: NAD, frail appearing, thin Cardiovascular: regular rate and rhythm Pulmonary: wheezing with decreased left base Neurological: Walks with walker  Hakan Nudelman Ihor Gully, NP

## 2019-06-14 ENCOUNTER — Telehealth (HOSPITAL_COMMUNITY): Payer: Self-pay

## 2019-06-14 NOTE — Telephone Encounter (Signed)
Contacted [name] daughter to make sure he did get his Trelegy.  She said yes she refilled it and gave it to him.  He has been taking it since.  She states he did not say he was out and will watch it more closely.  Will continue to visit for heart failure.   [place] [phone]

## 2019-06-24 ENCOUNTER — Encounter (HOSPITAL_COMMUNITY): Payer: Self-pay

## 2019-06-24 ENCOUNTER — Other Ambulatory Visit (HOSPITAL_COMMUNITY): Payer: Self-pay

## 2019-06-24 NOTE — Progress Notes (Signed)
Today had a home visit with [name].  He is doing so much better.  Lungs are clear.  On 2 lpm oxygen he is 98%.  He denies any problems today.  Has no edema.  Weighs daily.  Has all his medications and aware of how to take them.  His daughter has cut back from visiting everyday and coming a few times a week.  He able to fix his food for meals.  PT is almost to stop, he went walking to the mail box and back with PT yesterday with no oxygen and sats stayed at 93%.  He states has been going without it for a couple of hours a couple times a day and sats have stayed above 93%.  His goal is to stop his oxygen and start driving when he wants to go somewhere.  He worries about making things hard on his daughter.  Appetite is good.  He eats small meals.  He is sleeping better at night.  Will continue to visit for Heart Failure.   [name] [phone]

## 2019-07-22 ENCOUNTER — Telehealth (HOSPITAL_COMMUNITY): Payer: Self-pay

## 2019-07-22 NOTE — Telephone Encounter (Signed)
Today had a phone appt with Muhsin's daughter.  She states he is getting better every week.  He is taking his oxygen off for several hours during the day.  He is walking back and forth to mail box without his oxygen and not getting short of breath.  His weight has been stable at 135 lbs.  He has been feeling good lately.  Appetite is good.  He is wanting to get off oxygen for good so he can start driving short distances.  They are aware of up coming appts.  He has all his medications and aware of how to take them.  His daughter places his meds in a box for him.  He uses his nebulizer daily and uses his inhaler.  Cardiology is drawing blood watching his INR and changing his warfarin medications.  They are both aware of the change.  He has no cough, shortness of breath, headaches or dizziness.  He watches what he eats.  His sleep is getting better at night.  Will continue to visit for heart failure.   [place] [phone]

## 2019-07-26 ENCOUNTER — Other Ambulatory Visit: Payer: Medicare Other | Admitting: Nurse Practitioner

## 2019-07-26 ENCOUNTER — Other Ambulatory Visit: Payer: Self-pay

## 2019-07-26 ENCOUNTER — Encounter: Payer: Self-pay | Admitting: Nurse Practitioner

## 2019-07-26 DIAGNOSIS — J449 Chronic obstructive pulmonary disease, unspecified: Secondary | ICD-10-CM

## 2019-07-26 DIAGNOSIS — Z515 Encounter for palliative care: Secondary | ICD-10-CM

## 2019-07-26 NOTE — Progress Notes (Signed)
[place] [facility] Consult Note Telephone: [phone]  Fax: [phone]  PATIENT NAME: [name] DOB: October 16, 1928 MRN: [id number]  PRIMARY CARE PROVIDER:   Baxter Hire, MD  REFERRING PROVIDER:  Baxter Hire, MD [place],  [place] [address]  RESPONSIBLE PARTY:Self; Daughter, [name] [phone] or [phone]  RECOMMENDATIONS and PLAN: 1.ACP: wishes for DNR; in vynca; will revisit MOST form next visit  2. Dyspnea, continue to monitor respiratory status; O2, nebulizers, pulmonary toileting  3.Palliative care encounter Palliative medicine team will continue to support patient, patient's family, and medical team. Visit consisted of counseling and education dealing with the complex and emotionally intense issues of symptom management and palliative care in the setting of serious and potentially life-threatening illness  I spent 60 minutes providing this consultation,  from 2:30pm to 3:30pm. More than 50% of the time in this consultation was spent coordinating communication.   HISTORY OF PRESENT ILLNESS:  [name] is a 86 y.o. 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 Follow up visit palliative care in person for [name]. I met in person with [name]. We talked about purpose of palliative care visit. Mr Maybury in agreement. We talked about how he was feeling. Mr Nodarse endorses that he is doing well. [name] does continue to have a cough where he is coughing up white productive thick speedom. He continues to do is nebulizers, trelegy inhaler, incentive spirometer. We talked about symptoms of shortness of breath. We talked about his oxygen. Mr Chittum endorse is when he is sitting  down he will decrease his oxygen and then increase that with exertion. We talked about his last appointment with [name], Pulmonology. Chest x-ray was improved. We talked about chronic disease progression. We talked about quality of life with chronic disease. We talked about last hospitalization and covid positive with pneumonia. [name] talked at length about been taking some time for him to heal. We talked about overall exhaustion after catastrophic events meaning hospitalizations and progression of chronic disease, the impact that it has one's body. We talked about weakness which has improved considerably. Mr Lemmerman does continue to be ambulatory, unable to do is adl's though it does take him some time. Mr Cwynar endorses that has been improving with endurance. We talked about symptoms of pain which he denies. We talked about shortness of breath. We talked about his appetite. [name] has appeared to have gained a little bit of weight. We talked about diet in food he likes to eat. We talked about nutrition. We talked about medical goals of care. We talked about when his wife was living and the last days of her life where she passed at home with hospice. We talked about her debilitating disease progression of Crohn's, was hospitalized, placed in short-term rehab and then discharged home. Mr. Weekes endorses she was ready to pass when she returned home. We talked about quality of life. We talked about the end part of one's life and what that would look like for [name]. Mr. Kops endorses he has talked to [name] his daughter health care power-of-attorney about what his wishes are for wanting to stay at home if possible. We talked about role of palliative care and plan of care. We talked about follow-up visit as she keeps up with the appointments. Therapeutic listening and  emotional support provided. Mr Hutsell was grateful for palliative care visit. Questions answered to  satisfaction.  Palliative Care was asked to help to continue to address goals of care.   CODE STATUS: DNR  PPS: 50% HOSPICE ELIGIBILITY/DIAGNOSIS: TBD  PAST MEDICAL HISTORY:  Past Medical History:  Diagnosis Date  . CHF (congestive heart failure) ([place])   . COPD (chronic obstructive pulmonary disease) ([place])   . Coronary artery disease   . Hypertension     SOCIAL HX:  Social History   Tobacco Use  . Smoking status: Former Research scientist (life sciences)  . Smokeless tobacco: Never Used  Substance Use Topics  . Alcohol use: Never    ALLERGIES: No Known Allergies   PERTINENT MEDICATIONS:  Outpatient Encounter Medications as of 07/26/2019  Medication Sig  . albuterol (PROVENTIL) (2.5 MG/3ML) 0.083% nebulizer solution Take 3 mLs (2.5 mg total) by nebulization every 2 (two) hours as needed for wheezing or shortness of breath.  Marland Kitchen albuterol (PROVENTIL) (2.5 MG/3ML) 0.083% nebulizer solution Take 3 mLs (2.5 mg total) by nebulization every 6 (six) hours as needed for wheezing or shortness of breath.  Marland Kitchen atorvastatin (LIPITOR) 20 MG tablet Take 20 mg by mouth daily.  . bisacodyl (DULCOLAX) 5 MG EC tablet Take 5-15 mg by mouth daily as needed for moderate constipation.  . cetirizine (ZYRTEC) 10 MG tablet Take 10 mg by mouth daily.  Marland Kitchen diltiazem (TIAZAC) 300 MG 24 hr capsule Take 300 mg by mouth daily.  . Fluticasone-Umeclidin-Vilant (TRELEGY ELLIPTA) 100-62.5-25 MCG/INH AEPB Inhale 1 puff into the lungs daily.   . furosemide (LASIX) 20 MG tablet Take 1 tablet (20 mg total) by mouth daily.  . metoprolol tartrate (LOPRESSOR) 50 MG tablet Take 50 mg by mouth 2 (two) times daily.  . mometasone (NASONEX) 50 MCG/ACT nasal spray Place 1 spray into the nose daily. Each nostril  . potassium chloride (KLOR-CON) 10 MEQ tablet Take 10 mEq by mouth daily.  . predniSONE (DELTASONE) 10 MG tablet Take 5 mg by mouth daily with breakfast.  . Tiotropium Bromide Monohydrate (SPIRIVA RESPIMAT) 1.25 MCG/ACT AERS Inhale 2 sprays into  the lungs as needed.   . traZODone (DESYREL) 50 MG tablet Take 50 mg by mouth at bedtime as needed for sleep.   Marland Kitchen warfarin (COUMADIN) 2 MG tablet Take 2 mg by mouth daily.    No facility-administered encounter medications on file as of 07/26/2019.    PHYSICAL EXAM:   General: NAD, frail appearing, thin, pleasant male Cardiovascular: regular rate and rhythm Pulmonary: clear ant fields; decrease bases, fine crackles right base Neurological: generalized weakness  Fortunato Nordin Ihor Gully, NP

## 2019-08-03 ENCOUNTER — Ambulatory Visit: Payer: Medicare Other | Admitting: Family

## 2019-08-10 ENCOUNTER — Encounter (HOSPITAL_COMMUNITY): Payer: Self-pay

## 2019-08-10 ENCOUNTER — Other Ambulatory Visit (HOSPITAL_COMMUNITY): Payer: Self-pay

## 2019-08-10 NOTE — Progress Notes (Signed)
Today had a home visit with Naasir.  He states doing good.  He states his legs gets more tired than they were when walking through the house and outside.  Asked if he was still doing exercises that PT had showed him, he states some of them.  He sates when his legs are weak he puts back on his oxygen and sits down. He states when walking his breathing does not get short, stays same.   He has oxygen set at 2 lpm and takes off in house a few hours a day, always sleep with it.  He wants to come off of it.  He has all his medications and aware of how to take it.  He does neb treatments 4 times a day.  He denies any chest pain, shortness of breath, headaches or dizziness.  He watches what he eats, low sodium choices.  He does not eat a lot at meals but enough.  Weight staying same.  His daughter helps him with food, shopping and doctor appts.  He lives alone but she visits mostly every day.  He has everything for everyday living.  Will continue to visit for heart failure.   [place] [phone]

## 2019-09-15 ENCOUNTER — Other Ambulatory Visit: Payer: Self-pay | Admitting: Family

## 2019-09-15 ENCOUNTER — Telehealth (HOSPITAL_COMMUNITY): Payer: Self-pay

## 2019-09-16 ENCOUNTER — Encounter: Payer: Self-pay | Admitting: Nurse Practitioner

## 2019-09-16 ENCOUNTER — Other Ambulatory Visit: Payer: Self-pay | Admitting: Family

## 2019-09-16 ENCOUNTER — Other Ambulatory Visit: Payer: Self-pay

## 2019-09-16 ENCOUNTER — Other Ambulatory Visit: Payer: Medicare Other | Admitting: Nurse Practitioner

## 2019-09-16 DIAGNOSIS — J449 Chronic obstructive pulmonary disease, unspecified: Secondary | ICD-10-CM

## 2019-09-16 DIAGNOSIS — Z515 Encounter for palliative care: Secondary | ICD-10-CM

## 2019-09-16 NOTE — Progress Notes (Signed)
[facility] Consult Note Telephone: [phone]  Fax: [phone]  PATIENT NAME: [name] DOB: 08-30-1928 MRN: [id number]  PRIMARY CARE PROVIDER:   Baxter Hire, MD  REFERRING PROVIDER:  Baxter Hire, MD [place],  [place] [address]  RESPONSIBLE PARTY:Self; Daughter, [name] [phone] or [phone]  Due to the COVID-19 crisis, this visit was done via telemedicine from my office and it was initiated and consent by this patient and or family.  RECOMMENDATIONS and PLAN: 1.ACP: wishes for DNR;in vynca;   2. Dyspnea, continue to monitor respiratory status; O2, nebulizers, pulmonary toileting  3.Palliative care encounter Palliative medicine team will continue to support patient, patient's family, and medical team. Visit consisted of counseling and education dealing with the complex and emotionally intense issues of symptom management and palliative care in the setting of serious and potentially life-threatening illness  4. F/u 4 weeks due to slow decline, chronically ill, elderly, symptomatic fatigue with intermit dyspnea, ongoing goc  I spent 50 minutes providing this consultation,  from 12:00pm to 12:50pm. More than 50% of the time in this consultation was spent coordinating communication.   HISTORY OF PRESENT ILLNESS:  [name] is a 86 y.o. 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 Follow up palliative care in person visiting to telemedicine telephonic is video not available for [facility] Mr. Smola daughter's request due to increasing Rising numbers supposed and wants to limit exposure. I called [name], spoke on speaker with Mr. Rann and [name] at the same time. We talked about  palliative care visit. We talked about how Mr Termini has been feeling. Mr. Hodkinson continues to have good days where he can keep his oxygen off while he is sitting but with any type of exertion he has to put it back on. We talked about last pulmonology visit with [name]. [name] endorses Mr. Mergenthaler pulmonary is doing better but cardiac wise is worse. Asked [name] to be more specific and she showed that the fatigue and tiredness is more frequent. Asked Mr. Lennox if he has noticed when he does wear his oxygen if that does help with the fatigue. Mr Tsuchiya endorses that it does. We talked about the option of using continuous oxygen rather than taking a break but recommended further discussion with [name] NP at [facility] for further discussion. Mr. Deutschman and [name] both in agreement. [name] asked if there was a way to get a 50 foot oxygen tubing as the one they have has become woren. [name] endorses Dr [employer] office has sent in an order but they have never received it. Discussed with [name] and Mr. Micale will talk with clinical navigator for palliative care to see if there is any other options. Oxygen company used is [employer]. We talked about importance of rest and energy recovery with worsening symptoms of fatigue. We talked about sleep hygiene. Mr Luczak endorses that typically he does get up during the night to go to the bathroom but his able to go right back to sleep. Mr. Detloff endorses there is some nights where he is unable to sleep. Mr Trejos endorses that typically he tries not to nap. Mr. Hargadon endorses he does try to walk within his home for exercise and on occasion when the weather is cooler he is able to walk to his mailbox and  back. We talked about his appetite which has continue to improve. we talked about his functional level as he remains ambulatory with no recent falls, hospitalizations, infections, wounds. Medical goals of care reviewed. We  talked about role of palliative care and plan of care. We talked about follow up palliative care visit in 4 weeks as with symptoms of fatigue, shortness of breath with slow decline in the setting of natural aging and chronic disease progression. Mr Teichert and [name] both in agreement but wishes for telemedicine visit rather than in person. Appointment scheduled. Discussed with [name] will contact tomorrow once able to gain more information about how to obtain a 50-foot oxygen tubing. [name] and agreement. Therapeutic listening and emotional support provided. Contact information. Questions answered to satisfaction.  Palliative Care was asked to help to continue to address goals of care.   CODE STATUS: DNR  PPS: 50% HOSPICE ELIGIBILITY/DIAGNOSIS: TBD  PAST MEDICAL HISTORY:  Past Medical History:  Diagnosis Date  . CHF (congestive heart failure) ([place])   . COPD (chronic obstructive pulmonary disease) ([place])   . Coronary artery disease   . Hypertension     SOCIAL HX:  Social History   Tobacco Use  . Smoking status: Former Research scientist (life sciences)  . Smokeless tobacco: Never Used  Substance Use Topics  . Alcohol use: Never    ALLERGIES: No Known Allergies   PERTINENT MEDICATIONS:  Outpatient Encounter Medications as of 09/16/2019  Medication Sig  . albuterol (PROVENTIL) (2.5 MG/3ML) 0.083% nebulizer solution Take 3 mLs (2.5 mg total) by nebulization every 2 (two) hours as needed for wheezing or shortness of breath.  Marland Kitchen albuterol (PROVENTIL) (2.5 MG/3ML) 0.083% nebulizer solution Take 3 mLs (2.5 mg total) by nebulization every 6 (six) hours as needed for wheezing or shortness of breath.  Marland Kitchen atorvastatin (LIPITOR) 20 MG tablet Take 20 mg by mouth daily.  . bisacodyl (DULCOLAX) 5 MG EC tablet Take 5-15 mg by mouth daily as needed for moderate constipation.  . cetirizine (ZYRTEC) 10 MG tablet Take 10 mg by mouth daily.  Marland Kitchen diltiazem (TIAZAC) 300 MG 24 hr capsule Take 300 mg by mouth daily.  .  Fluticasone-Umeclidin-Vilant (TRELEGY ELLIPTA) 100-62.5-25 MCG/INH AEPB Inhale 1 puff into the lungs daily.   . furosemide (LASIX) 20 MG tablet TAKE 1 TABLET BY MOUTH DAILY  . metoprolol tartrate (LOPRESSOR) 50 MG tablet Take 50 mg by mouth 2 (two) times daily.  . mometasone (NASONEX) 50 MCG/ACT nasal spray Place 1 spray into the nose daily. Each nostril  . potassium chloride (KLOR-CON) 10 MEQ tablet Take 10 mEq by mouth daily.  . predniSONE (DELTASONE) 10 MG tablet Take 5 mg by mouth daily with breakfast.  . Tiotropium Bromide Monohydrate (SPIRIVA RESPIMAT) 1.25 MCG/ACT AERS Inhale 2 sprays into the lungs as needed.   . traZODone (DESYREL) 50 MG tablet Take 50 mg by mouth at bedtime as needed for sleep.   Marland Kitchen warfarin (COUMADIN) 2 MG tablet Take 2 mg by mouth daily.    No facility-administered encounter medications on file as of 09/16/2019.    PHYSICAL EXAM:   deferred  Jordin Vicencio Ihor Gully, NP

## 2019-09-28 NOTE — Telephone Encounter (Signed)
Today had a phone visit with Benn Moulder daughter.  She states he is doing well.  He wants off his oxygen but Dr Raul Del says he is not ready yet.  She has caught him going out to his truck and moving it some in the driveway.  He is wanting to go driving.  Discussed with both of them for him to go with someone not by his self first time.  He is not wanting to go with his oxygen, he is wanting to go places by his self and maybe start eating at his restaurant he use to go to.  He has no weight gain, they watch high sodium foods.  He watches how much fluids he drinks.  Denies any chest pain, headaches, dizziness or more shortness of breath.  He goes without his oxygen a lot at home inside.  He can walk his driveway to mail box and back without it some days.  When extremely hot he is staying inside.  They are concerned about people coming in right now with spike in Covid, chose to do a telephone visit vs home visit.  They both has not had vaccine shot.  He has all his medications and aware of how to take them.  Will continue to visit for heart failure.   [place] [phone]

## 2019-10-04 NOTE — Progress Notes (Signed)
Patient ID: [name], male    DOB: 12-30-1928, 87 y.o.   MRN: [id number]  HPI  [name] is a 86 y/o male with a history of CAD, HTN, previous tobacco use and chronic heart failure.   Echo report from 01/06/2019 reviewed and showed an EF of 55-60% along with mild/ moderate Christopher and moderately elevated PA pressure.   Has not been admitted or been in the ED in the last 6 months.   He presents today for a follow-up visit with a chief complaint of minimal shortness of breath upon moderate exertion. He describes this as chronic in nature having been present for several years. He has associated fatigue and cough along with this. He denies any difficulty sleeping, dizziness, abdominal distention, palpitations, pedal edema, chest pain, wheezing or weight gain.   Does say that he feels a little more short of breath today than yesterday because he feels like he pulled a muscle in his chest. Says that he bent over to pull a straight back chair towards him and when he did, he felt a pulling sensation in his chest wall. Unable to reproduce the pain when touching it and he says that he's been using his incentive spirometry and can still get it as high as previously.   Past Medical History:  Diagnosis Date  . CHF (congestive heart failure) ([employer])   . COPD (chronic obstructive pulmonary disease) ([place])   . Coronary artery disease   . Hypertension    Past Surgical History:  Procedure Laterality Date  . Renal Bypass Surgery     No family history on file. Social History   Tobacco Use  . Smoking status: Former Research scientist (life sciences)  . Smokeless tobacco: Never Used  Substance Use Topics  . Alcohol use: Never   No Known Allergies  Prior to Admission medications   Medication Sig Start Date End Date Taking? Authorizing Provider  albuterol (PROVENTIL) (2.5 MG/3ML) 0.083% nebulizer solution Take 3 mLs (2.5 mg total) by nebulization every 2 (two) hours as needed for wheezing or shortness of breath. 01/31/19  Yes  Danford, Suann Larry, MD  albuterol (PROVENTIL) (2.5 MG/3ML) 0.083% nebulizer solution Take 3 mLs (2.5 mg total) by nebulization every 6 (six) hours as needed for wheezing or shortness of breath. 01/31/19  Yes Danford, Suann Larry, MD  atorvastatin (LIPITOR) 20 MG tablet Take 20 mg by mouth daily.   Yes [provider]  bisacodyl (DULCOLAX) 5 MG EC tablet Take 5-15 mg by mouth daily as needed for moderate constipation.   Yes [provider]  cetirizine (ZYRTEC) 10 MG tablet Take 10 mg by mouth daily. 01/19/19 01/19/20 Yes [provider]  diltiazem (TIAZAC) 300 MG 24 hr capsule Take 300 mg by mouth daily. 09/01/18  Yes [provider]  Fluticasone-Umeclidin-Vilant (TRELEGY ELLIPTA) 100-62.5-25 MCG/INH AEPB Inhale 1 puff into the lungs daily.    Yes [provider]  furosemide (LASIX) 20 MG tablet TAKE 1 TABLET BY MOUTH DAILY 09/16/19  Yes [name] A, FNP  metoprolol tartrate (LOPRESSOR) 50 MG tablet Take 50 mg by mouth 2 (two) times daily.   Yes [provider]  mometasone (NASONEX) 50 MCG/ACT nasal spray Place 1 spray into the nose daily. Each nostril 01/19/19 01/19/20 Yes [provider]  potassium chloride (KLOR-CON) 10 MEQ tablet Take 10 mEq by mouth daily.   Yes [provider]  predniSONE (DELTASONE) 10 MG tablet Take 5 mg by mouth daily with breakfast.   Yes [provider]  Tiotropium Bromide Monohydrate (SPIRIVA RESPIMAT) 1.25 MCG/ACT AERS Inhale 2 sprays into the lungs as needed.  01/14/19  Yes [provider]  traZODone (DESYREL) 50 MG tablet Take 50 mg by mouth at bedtime as needed for sleep.  11/18/18 11/18/19 Yes [provider]  warfarin (COUMADIN) 2 MG tablet Take 2 mg by mouth daily.    Yes [provider]    Review of Systems  Constitutional: Positive for fatigue. Negative for appetite change.  HENT: Negative for congestion, postnasal drip and sore throat.   Eyes:  Negative.   Respiratory: Positive for cough (after using nebulizer) and shortness of breath (with moderate exertion). Negative for wheezing.   Cardiovascular: Negative for chest pain, palpitations and leg swelling.  Gastrointestinal: Negative for abdominal distention and abdominal pain.  Endocrine: Negative.   Genitourinary: Negative.   Musculoskeletal: Negative for back pain and neck pain.  Skin: Negative.   Allergic/Immunologic: Negative.   Neurological: Negative for dizziness and light-headedness.  Hematological: Negative for adenopathy. Does not bruise/bleed easily.  Psychiatric/Behavioral: Negative for dysphoric mood and sleep disturbance (sleeping on 2 pillows). The patient is not nervous/anxious.    Vitals:   10/05/19 1224  BP: (!) 143/67  Pulse: 83  Resp: 16  SpO2: 94%  Weight: 146 lb 4 oz (66.3 kg)  Height: 5\' 10"  (1.778 m)   Wt Readings from Last 3 Encounters:  10/05/19 146 lb 4 oz (66.3 kg)  08/10/19 137 lb (62.1 kg)  06/24/19 135 lb (61.2 kg)   Lab Results  Component Value Date   CREATININE 0.95 04/05/2019   CREATININE 0.86 01/29/2019   CREATININE 0.76 01/28/2019    Physical Exam Vitals and nursing note reviewed.  Constitutional:      Appearance: He is well-developed.  HENT:     Head: Normocephalic and atraumatic.  Neck:     Vascular: No JVD.  Cardiovascular:     Rate and Rhythm: Normal rate and regular rhythm.  Pulmonary:     Effort: Pulmonary effort is normal. No respiratory distress.     Breath sounds: No wheezing or rales.  Abdominal:     Palpations: Abdomen is soft.     Tenderness: There is no abdominal tenderness.  Musculoskeletal:     Cervical back: Normal range of motion and neck supple.     Right lower leg: No tenderness. Edema (trace around ankle) present.     Left lower leg: No tenderness. Edema (trace around ankle) present.     Comments: Unable to palpate any tenderness  Skin:    General: Skin is warm and dry.  Neurological:      General: No focal deficit present.     Mental Status: He is alert and oriented to person, place, and time.  Psychiatric:        Mood and Affect: Mood normal.        Behavior: Behavior normal.    Assessment & Plan:  1: Chronic heart failure with preserved ejection fraction- - NYHA class II - euvolemic today - weighing daily; reminded to call for an overnight weight gain of >2 pounds or a weekly weight gain of >5 pounds - weight up 10 pounds from last visit here 3 months ago; says his appetite is "great" - not adding salt but does like jr bacon cheeseburger from [employer] or curly fries from [employer]; reviewed the importance of looking at food labels so that he can keep daily sodium intake to 2000mg  / day - saw cardiology Minette Brine) 05/04/19 & returns 11/16/19 -  saw palliative care 09/16/19 - participating in paramedicine program - BNP 01/25/2019 was 1362.0  2: HTN- - BP looks good today - saw PCP Edwina Barth) 09/20/19 - BMP 09/20/19 reviewed and showed sodium 136, potassium 4.5, creatinine 1.3 and GFR 52  3: COPD- - wearing oxygen at 1.5-2:L upon exertion and at bedtime; does remove it when sitting for long periods of time - saw pulmonology Raul Del) 09/03/19 & returns 12/31/19 - chest muscle should continue to improve; if shortness of breath worsens or he finds that he can't use the incentive spirometry as well he needs to call Dr. Gust Brooms office   Patient did not bring his medications nor a list. Each medication was verbally reviewed with the patient and he was encouraged to bring the bottles to every visit to confirm accuracy of list.  Return in 3 months or sooner for any questions/problems before then.

## 2019-10-05 ENCOUNTER — Ambulatory Visit: Payer: Medicare Other | Admitting: Family

## 2019-10-05 ENCOUNTER — Other Ambulatory Visit: Payer: Self-pay

## 2019-10-05 ENCOUNTER — Encounter: Payer: Self-pay | Admitting: Family

## 2019-10-05 VITALS — BP 143/67 | HR 83 | Resp 16 | Ht 70.0 in | Wt 146.2 lb

## 2019-10-05 DIAGNOSIS — I110 Hypertensive heart disease with heart failure: Secondary | ICD-10-CM | POA: Insufficient documentation

## 2019-10-05 DIAGNOSIS — Z7901 Long term (current) use of anticoagulants: Secondary | ICD-10-CM | POA: Insufficient documentation

## 2019-10-05 DIAGNOSIS — J449 Chronic obstructive pulmonary disease, unspecified: Secondary | ICD-10-CM | POA: Insufficient documentation

## 2019-10-05 DIAGNOSIS — I5032 Chronic diastolic (congestive) heart failure: Secondary | ICD-10-CM | POA: Insufficient documentation

## 2019-10-05 DIAGNOSIS — I2510 Atherosclerotic heart disease of native coronary artery without angina pectoris: Secondary | ICD-10-CM | POA: Insufficient documentation

## 2019-10-05 DIAGNOSIS — A419 Sepsis, unspecified organism: Secondary | ICD-10-CM | POA: Diagnosis not present

## 2019-10-05 DIAGNOSIS — J42 Unspecified chronic bronchitis: Secondary | ICD-10-CM

## 2019-10-05 DIAGNOSIS — I10 Essential (primary) hypertension: Secondary | ICD-10-CM

## 2019-10-05 DIAGNOSIS — R0602 Shortness of breath: Secondary | ICD-10-CM | POA: Insufficient documentation

## 2019-10-05 DIAGNOSIS — Z79899 Other long term (current) drug therapy: Secondary | ICD-10-CM | POA: Insufficient documentation

## 2019-10-05 DIAGNOSIS — Z87891 Personal history of nicotine dependence: Secondary | ICD-10-CM | POA: Insufficient documentation

## 2019-10-05 NOTE — Patient Instructions (Signed)
Continue weighing daily and call for an overnight weight gain of > 2 pounds or a weekly weight gain of >5 pounds. 

## 2019-10-08 ENCOUNTER — Inpatient Hospital Stay
Admission: EM | Admit: 2019-10-08 | Discharge: 2019-10-15 | DRG: 871 | Disposition: A | Discharge status: Home Health | Payer: Medicare Other | Attending: Obstetrics and Gynecology | Admitting: Obstetrics and Gynecology

## 2019-10-08 ENCOUNTER — Emergency Department: Payer: Medicare Other

## 2019-10-08 ENCOUNTER — Other Ambulatory Visit: Payer: Self-pay

## 2019-10-08 DIAGNOSIS — N179 Acute kidney failure, unspecified: Secondary | ICD-10-CM | POA: Diagnosis not present

## 2019-10-08 DIAGNOSIS — I5033 Acute on chronic diastolic (congestive) heart failure: Secondary | ICD-10-CM | POA: Diagnosis present

## 2019-10-08 DIAGNOSIS — Z66 Do not resuscitate: Secondary | ICD-10-CM | POA: Diagnosis present

## 2019-10-08 DIAGNOSIS — Z7951 Long term (current) use of inhaled steroids: Secondary | ICD-10-CM

## 2019-10-08 DIAGNOSIS — Z7901 Long term (current) use of anticoagulants: Secondary | ICD-10-CM | POA: Diagnosis not present

## 2019-10-08 DIAGNOSIS — R6520 Severe sepsis without septic shock: Secondary | ICD-10-CM | POA: Diagnosis present

## 2019-10-08 DIAGNOSIS — Z9981 Dependence on supplemental oxygen: Secondary | ICD-10-CM

## 2019-10-08 DIAGNOSIS — M8088XA Other osteoporosis with current pathological fracture, vertebra(e), initial encounter for fracture: Secondary | ICD-10-CM | POA: Diagnosis present

## 2019-10-08 DIAGNOSIS — I2510 Atherosclerotic heart disease of native coronary artery without angina pectoris: Secondary | ICD-10-CM | POA: Diagnosis present

## 2019-10-08 DIAGNOSIS — J432 Centrilobular emphysema: Secondary | ICD-10-CM | POA: Diagnosis present

## 2019-10-08 DIAGNOSIS — T502X5A Adverse effect of carbonic-anhydrase inhibitors, benzothiadiazides and other diuretics, initial encounter: Secondary | ICD-10-CM | POA: Diagnosis not present

## 2019-10-08 DIAGNOSIS — Z87891 Personal history of nicotine dependence: Secondary | ICD-10-CM

## 2019-10-08 DIAGNOSIS — I083 Combined rheumatic disorders of mitral, aortic and tricuspid valves: Secondary | ICD-10-CM | POA: Diagnosis present

## 2019-10-08 DIAGNOSIS — Z20822 Contact with and (suspected) exposure to covid-19: Secondary | ICD-10-CM | POA: Diagnosis present

## 2019-10-08 DIAGNOSIS — R791 Abnormal coagulation profile: Secondary | ICD-10-CM | POA: Diagnosis present

## 2019-10-08 DIAGNOSIS — S22000A Wedge compression fracture of unspecified thoracic vertebra, initial encounter for closed fracture: Secondary | ICD-10-CM | POA: Diagnosis not present

## 2019-10-08 DIAGNOSIS — R0902 Hypoxemia: Secondary | ICD-10-CM

## 2019-10-08 DIAGNOSIS — R0602 Shortness of breath: Secondary | ICD-10-CM | POA: Diagnosis present

## 2019-10-08 DIAGNOSIS — M800AXA Age-related osteoporosis with current pathological fracture, other site, initial encounter for fracture: Secondary | ICD-10-CM | POA: Diagnosis present

## 2019-10-08 DIAGNOSIS — J690 Pneumonitis due to inhalation of food and vomit: Secondary | ICD-10-CM | POA: Diagnosis present

## 2019-10-08 DIAGNOSIS — M4856XA Collapsed vertebra, not elsewhere classified, lumbar region, initial encounter for fracture: Secondary | ICD-10-CM

## 2019-10-08 DIAGNOSIS — I482 Chronic atrial fibrillation, unspecified: Secondary | ICD-10-CM | POA: Diagnosis present

## 2019-10-08 DIAGNOSIS — E785 Hyperlipidemia, unspecified: Secondary | ICD-10-CM | POA: Diagnosis present

## 2019-10-08 DIAGNOSIS — J9621 Acute and chronic respiratory failure with hypoxia: Secondary | ICD-10-CM | POA: Diagnosis present

## 2019-10-08 DIAGNOSIS — I5032 Chronic diastolic (congestive) heart failure: Secondary | ICD-10-CM | POA: Diagnosis present

## 2019-10-08 DIAGNOSIS — I110 Hypertensive heart disease with heart failure: Secondary | ICD-10-CM | POA: Diagnosis present

## 2019-10-08 DIAGNOSIS — I10 Essential (primary) hypertension: Secondary | ICD-10-CM

## 2019-10-08 DIAGNOSIS — S2220XA Unspecified fracture of sternum, initial encounter for closed fracture: Secondary | ICD-10-CM

## 2019-10-08 DIAGNOSIS — A419 Sepsis, unspecified organism: Secondary | ICD-10-CM | POA: Diagnosis present

## 2019-10-08 DIAGNOSIS — J438 Other emphysema: Secondary | ICD-10-CM | POA: Diagnosis present

## 2019-10-08 DIAGNOSIS — R0600 Dyspnea, unspecified: Secondary | ICD-10-CM

## 2019-10-08 DIAGNOSIS — J441 Chronic obstructive pulmonary disease with (acute) exacerbation: Secondary | ICD-10-CM | POA: Diagnosis not present

## 2019-10-08 DIAGNOSIS — Z7952 Long term (current) use of systemic steroids: Secondary | ICD-10-CM | POA: Diagnosis not present

## 2019-10-08 DIAGNOSIS — J449 Chronic obstructive pulmonary disease, unspecified: Secondary | ICD-10-CM | POA: Diagnosis not present

## 2019-10-08 DIAGNOSIS — R7989 Other specified abnormal findings of blood chemistry: Secondary | ICD-10-CM

## 2019-10-08 DIAGNOSIS — Z79899 Other long term (current) drug therapy: Secondary | ICD-10-CM

## 2019-10-08 LAB — COMPREHENSIVE METABOLIC PANEL
ALT: 14 U/L (ref 0–44)
AST: 19 U/L (ref 15–41)
Albumin: 4 g/dL (ref 3.5–5.0)
Alkaline Phosphatase: 66 U/L (ref 38–126)
Anion gap: 8 (ref 5–15)
BUN: 31 mg/dL — ABNORMAL HIGH (ref 8–23)
CO2: 28 mmol/L (ref 22–32)
Calcium: 9.1 mg/dL (ref 8.9–10.3)
Chloride: 99 mmol/L (ref 98–111)
Creatinine, Ser: 1.04 mg/dL (ref 0.61–1.24)
GFR calc Af Amer: >60 mL/min (ref >60)
GFR calc non Af Amer: >60 mL/min (ref >60)
Glucose, Bld: 108 mg/dL — ABNORMAL HIGH (ref 70–99)
Potassium: 4.4 mmol/L (ref 3.5–5.1)
Sodium: 135 mmol/L (ref 135–145)
Total Bilirubin: 1.1 mg/dL (ref 0.3–1.2)
Total Protein: 7.6 g/dL (ref 6.5–8.1)

## 2019-10-08 LAB — CBC WITH DIFFERENTIAL/PLATELET
Abs Immature Granulocytes: 0.07 10*3/uL (ref 0.00–0.07)
Basophils Absolute: 0.1 10*3/uL (ref 0.0–0.1)
Basophils Relative: 1 %
Eosinophils Absolute: 0.2 10*3/uL (ref 0.0–0.5)
Eosinophils Relative: 1 %
HCT: 35 % — ABNORMAL LOW (ref 39.0–52.0)
Hemoglobin: 12 g/dL — ABNORMAL LOW (ref 13.0–17.0)
Immature Granulocytes: 1 %
Lymphocytes Relative: 18 %
Lymphs Abs: 2.3 10*3/uL (ref 0.7–4.0)
MCH: 29.5 pg (ref 26.0–34.0)
MCHC: 34.3 g/dL (ref 30.0–36.0)
MCV: 86 fL (ref 80.0–100.0)
Monocytes Absolute: 1.6 10*3/uL — ABNORMAL HIGH (ref 0.1–1.0)
Monocytes Relative: 12 %
Neutro Abs: 8.6 10*3/uL — ABNORMAL HIGH (ref 1.7–7.7)
Neutrophils Relative %: 67 %
Platelets: 209 10*3/uL (ref 150–400)
RBC: 4.07 MIL/uL — ABNORMAL LOW (ref 4.22–5.81)
RDW: 15.3 % (ref 11.5–15.5)
WBC: 12.8 10*3/uL — ABNORMAL HIGH (ref 4.0–10.5)
nRBC: 0 % (ref 0.0–0.2)

## 2019-10-08 LAB — PROTIME-INR
INR: 2.4 — ABNORMAL HIGH (ref 0.8–1.2)
Prothrombin Time: 25.4 seconds — ABNORMAL HIGH (ref 11.4–15.2)

## 2019-10-08 LAB — TROPONIN I (HIGH SENSITIVITY)
Troponin I (High Sensitivity): 12 ng/L (ref <18)
Troponin I (High Sensitivity): 16 ng/L (ref <18)

## 2019-10-08 LAB — LACTIC ACID, PLASMA
Lactic Acid, Venous: 1.1 mmol/L (ref 0.5–1.9)
Lactic Acid, Venous: 0.9 mmol/L (ref 0.5–1.9)

## 2019-10-08 LAB — PROCALCITONIN: Procalcitonin: <0.1 ng/mL

## 2019-10-08 LAB — SARS CORONAVIRUS 2 BY RT PCR (HOSPITAL ORDER, PERFORMED IN ~~LOC~~ HOSPITAL LAB): SARS Coronavirus 2: NEGATIVE

## 2019-10-08 LAB — BRAIN NATRIURETIC PEPTIDE: B Natriuretic Peptide: 1004.8 pg/mL — ABNORMAL HIGH (ref 0.0–100.0)

## 2019-10-08 MED ORDER — ATORVASTATIN CALCIUM 20 MG PO TABS
20.0000 mg | ORAL_TABLET | Freq: Every day | ORAL | Status: DC
  Administered 2019-10-08 – 2019-10-14 (×7): 20 mg via ORAL
  Filled 2019-10-08 (×7): qty 1

## 2019-10-08 MED ORDER — DILTIAZEM HCL ER COATED BEADS 180 MG PO CP24
300.0000 mg | ORAL_CAPSULE | Freq: Every day | ORAL | Status: DC
  Administered 2019-10-08 – 2019-10-14 (×7): 300 mg via ORAL
  Filled 2019-10-08 (×7): qty 1

## 2019-10-08 MED ORDER — METOPROLOL TARTRATE 50 MG PO TABS
50.0000 mg | ORAL_TABLET | Freq: Two times a day (BID) | ORAL | Status: DC
  Administered 2019-10-08 – 2019-10-14 (×13): 50 mg via ORAL
  Filled 2019-10-08 (×13): qty 1

## 2019-10-08 MED ORDER — CLINDAMYCIN PHOSPHATE 600 MG/50ML IV SOLN
600.0000 mg | Freq: Three times a day (TID) | INTRAVENOUS | Status: DC
  Filled 2019-10-08 (×2): qty 50

## 2019-10-08 MED ORDER — SODIUM CHLORIDE 0.9 % IV SOLN
3.0000 g | Freq: Four times a day (QID) | INTRAVENOUS | Status: DC
  Administered 2019-10-08 – 2019-10-13 (×20): 3 g via INTRAVENOUS
  Filled 2019-10-08 (×2): qty 8
  Filled 2019-10-08: qty 3
  Filled 2019-10-08 (×3): qty 8
  Filled 2019-10-08: qty 3
  Filled 2019-10-08 (×3): qty 8
  Filled 2019-10-08: qty 3
  Filled 2019-10-08: qty 8
  Filled 2019-10-08 (×7): qty 3
  Filled 2019-10-08 (×3): qty 8

## 2019-10-08 MED ORDER — METOPROLOL TARTRATE 50 MG PO TABS
50.0000 mg | ORAL_TABLET | Freq: Once | ORAL | Status: AC
  Administered 2019-10-08: 50 mg via ORAL
  Filled 2019-10-08: qty 1

## 2019-10-08 MED ORDER — LISINOPRIL 5 MG PO TABS
2.5000 mg | ORAL_TABLET | Freq: Every day | ORAL | Status: DC
  Administered 2019-10-08 – 2019-10-14 (×7): 2.5 mg via ORAL
  Filled 2019-10-08 (×7): qty 1

## 2019-10-08 MED ORDER — SODIUM CHLORIDE 0.9 % IV SOLN
250.0000 mL | INTRAVENOUS | Status: DC | PRN
  Administered 2019-10-08: 250 mL via INTRAVENOUS

## 2019-10-08 MED ORDER — METHYLPREDNISOLONE SODIUM SUCC 40 MG IJ SOLR
40.0000 mg | Freq: Two times a day (BID) | INTRAMUSCULAR | Status: AC
  Administered 2019-10-08 – 2019-10-10 (×5): 40 mg via INTRAVENOUS
  Filled 2019-10-08 (×5): qty 1

## 2019-10-08 MED ORDER — ORAL CARE MOUTH RINSE
15.0000 mL | Freq: Two times a day (BID) | OROMUCOSAL | Status: DC
  Administered 2019-10-08 – 2019-10-14 (×11): 15 mL via OROMUCOSAL

## 2019-10-08 MED ORDER — ACETAMINOPHEN 325 MG PO TABS: 650.0000 mg | ORAL_TABLET | Freq: Four times a day (QID) | ORAL | Status: DC | PRN

## 2019-10-08 MED ORDER — ALBUTEROL SULFATE (2.5 MG/3ML) 0.083% IN NEBU: 2.5000 mg | INHALATION_SOLUTION | RESPIRATORY_TRACT | Status: DC | PRN

## 2019-10-08 MED ORDER — FUROSEMIDE 10 MG/ML IJ SOLN
20.0000 mg | Freq: Two times a day (BID) | INTRAMUSCULAR | Status: DC
  Administered 2019-10-08 – 2019-10-09 (×2): 20 mg via INTRAVENOUS
  Filled 2019-10-08 (×2): qty 2

## 2019-10-08 MED ORDER — WARFARIN - PHARMACIST DOSING INPATIENT: Freq: Every day | Status: DC

## 2019-10-08 MED ORDER — ONDANSETRON HCL 4 MG/2ML IJ SOLN: 4.0000 mg | Freq: Three times a day (TID) | INTRAMUSCULAR | Status: DC | PRN

## 2019-10-08 MED ORDER — SODIUM CHLORIDE 0.9% FLUSH
3.0000 mL | Freq: Two times a day (BID) | INTRAVENOUS | Status: DC
  Administered 2019-10-08 – 2019-10-14 (×14): 3 mL via INTRAVENOUS

## 2019-10-08 MED ORDER — WARFARIN SODIUM 2 MG PO TABS
2.0000 mg | ORAL_TABLET | Freq: Once | ORAL | Status: AC
  Administered 2019-10-08: 2 mg via ORAL
  Filled 2019-10-08: qty 1

## 2019-10-08 MED ORDER — DM-GUAIFENESIN ER 30-600 MG PO TB12
1.0000 | ORAL_TABLET | Freq: Two times a day (BID) | ORAL | Status: DC | PRN
  Administered 2019-10-08: 1 via ORAL
  Filled 2019-10-08: qty 1

## 2019-10-08 MED ORDER — LORATADINE 10 MG PO TABS
10.0000 mg | ORAL_TABLET | Freq: Every day | ORAL | Status: DC
  Administered 2019-10-09 – 2019-10-14 (×6): 10 mg via ORAL
  Filled 2019-10-08 (×6): qty 1

## 2019-10-08 MED ORDER — HYDRALAZINE HCL 20 MG/ML IJ SOLN: 5.0000 mg | INTRAMUSCULAR | Status: DC | PRN

## 2019-10-08 MED ORDER — IPRATROPIUM-ALBUTEROL 0.5-2.5 (3) MG/3ML IN SOLN
3.0000 mL | RESPIRATORY_TRACT | Status: DC
  Administered 2019-10-08 – 2019-10-09 (×4): 3 mL via RESPIRATORY_TRACT
  Filled 2019-10-08 (×4): qty 3

## 2019-10-08 MED ORDER — SODIUM CHLORIDE 0.9% FLUSH: 3.0000 mL | INTRAVENOUS | Status: DC | PRN

## 2019-10-08 MED ORDER — OXYCODONE-ACETAMINOPHEN 5-325 MG PO TABS
1.0000 | ORAL_TABLET | ORAL | Status: DC | PRN
  Administered 2019-10-08 – 2019-10-13 (×6): 1 via ORAL
  Filled 2019-10-08 (×6): qty 1

## 2019-10-08 MED ORDER — TRAZODONE HCL 50 MG PO TABS
50.0000 mg | ORAL_TABLET | Freq: Every evening | ORAL | Status: DC | PRN
  Administered 2019-10-11: 50 mg via ORAL
  Filled 2019-10-08 (×2): qty 1

## 2019-10-08 NOTE — ED Provider Notes (Signed)
[facility] Emergency Department Provider Note   ____________________________________________   First MD Initiated Contact with Patient 10/08/19 1144     (approximate)  I have reviewed the triage vital signs and the nursing notes.   HISTORY  Chief Complaint Shortness of Breath   HPI [name] is a 86 y.o. male who reports he was moving something in his house about a week ago and felt something pull in his tummy up to his chest.  Now he is having a deep wet cough and increased shortness of breath and pain in his chest that runs from the open abdominal protruded chest.  This is worse with movement or deep breathing or coughing.  He is not coughing up any phlegm now.  He is not running a fever.  EMS reports he was in the 90 range on 2 L and they put him up to 3 L he went up to 94.  [name] blood pressures up but he has not had his medication today.      Past Medical History:  Diagnosis Date  . CHF (congestive heart failure) ([place])   . COPD (chronic obstructive pulmonary disease) ([place])   . Coronary artery disease   . Hypertension     Patient Active Problem List   Diagnosis Date Noted  . Acute on chronic diastolic CHF (congestive heart failure) ([place]) 01/27/2019  . Acute respiratory failure with hypoxia ([place]) 01/24/2019  . Pneumonia due to COVID-19 virus 01/24/2019  . Atrial fibrillation, chronic ([place]) 01/24/2019  . Atrial fibrillation with RVR ([place]) 01/04/2019  . Constipation 01/04/2019  . HLD (hyperlipidemia) 01/04/2019  . Hypertension   . Coronary artery disease   . Elevated troponin   . COVID-19     Past Surgical History:  Procedure Laterality Date  . Renal Bypass Surgery      Prior to Admission medications   Medication Sig Start Date End Date Taking? Authorizing Provider  albuterol (PROVENTIL) (2.5 MG/3ML) 0.083% nebulizer solution Take 3 mLs (2.5 mg total) by nebulization every 2 (two) hours as needed for wheezing or shortness of  breath. 01/31/19   Danford, Suann Larry, MD  albuterol (PROVENTIL) (2.5 MG/3ML) 0.083% nebulizer solution Take 3 mLs (2.5 mg total) by nebulization every 6 (six) hours as needed for wheezing or shortness of breath. 01/31/19   Danford, Suann Larry, MD  atorvastatin (LIPITOR) 20 MG tablet Take 20 mg by mouth daily.    [provider]  bisacodyl (DULCOLAX) 5 MG EC tablet Take 5-15 mg by mouth daily as needed for moderate constipation.    [provider]  CARTIA XT 300 MG 24 hr capsule Take 300 mg by mouth daily. 09/16/19   [provider]  cetirizine (ZYRTEC) 10 MG tablet Take 10 mg by mouth daily. 01/19/19 01/19/20  [provider]  diltiazem (TIAZAC) 300 MG 24 hr capsule Take 300 mg by mouth daily. 09/01/18   [provider]  Fluticasone-Umeclidin-Vilant (TRELEGY ELLIPTA) 100-62.5-25 MCG/INH AEPB Inhale 1 puff into the lungs daily.     [provider]  furosemide (LASIX) 20 MG tablet TAKE 1 TABLET BY MOUTH DAILY 09/16/19   [name] A, FNP  metoprolol tartrate (LOPRESSOR) 50 MG tablet Take 50 mg by mouth 2 (two) times daily.    [provider]  mometasone (NASONEX) 50 MCG/ACT nasal spray Place 1 spray into the nose daily. Each nostril 01/19/19 01/19/20  [provider]  potassium chloride (KLOR-CON) 10 MEQ tablet Take 10 mEq by mouth daily.  [provider]  predniSONE (DELTASONE) 10 MG tablet Take 5 mg by mouth daily with breakfast.    [provider]  Tiotropium Bromide Monohydrate (SPIRIVA RESPIMAT) 1.25 MCG/ACT AERS Inhale 2 sprays into the lungs as needed.  01/14/19   [provider]  traZODone (DESYREL) 50 MG tablet Take 50 mg by mouth at bedtime as needed for sleep.  11/18/18 11/18/19  [provider]  warfarin (COUMADIN) 2 MG tablet Take 2 mg by mouth daily.     [provider]    Allergies Patient has no known allergies.  No family history on file.  Social  History Social History   Tobacco Use  . Smoking status: Former Research scientist (life sciences)  . Smokeless tobacco: Never Used  Vaping Use  . Vaping Use: Never used  Substance Use Topics  . Alcohol use: Never  . Drug use: Never    Review of Systems  Constitutional: No fever/chills Eyes: No visual changes. ENT: No sore throat. Cardiovascular: chest pain. Respiratory: Denies shortness of breath. Gastrointestinal: No abdominal pain.  No nausea, no vomiting.  No diarrhea.  No constipation. Genitourinary: Negative for dysuria. Musculoskeletal: Negative for back pain. Skin: Negative for rash. Neurological: Negative for headaches, focal weakness   ____________________________________________   PHYSICAL EXAM:  VITAL SIGNS: ED Triage Vitals  Enc Vitals Group     BP 10/08/19 1149 (!) 202/66     Pulse Rate 10/08/19 1149 95     Resp 10/08/19 1149 (!) 28     Temp 10/08/19 1149 98.4 F (36.9 C)     Temp Source 10/08/19 1149 Oral     SpO2 10/08/19 1149 99 %     Weight 10/08/19 1150 142 lb (64.4 kg)     Height 10/08/19 1150 5\' 10"  (1.778 m)     Head Circumference --      Peak Flow --      Pain Score 10/08/19 1150 4     Pain Loc --      Pain Edu? --      Excl. in [place]? --     Constitutional: Alert and oriented. Well appearing and in no acute distress. Eyes: Conjunctivae are normal.  Head: Atraumatic. Nose: No congestion/rhinnorhea. Mouth/Throat: Mucous membranes are moist.  Oropharynx non-erythematous. Neck: No stridor.  Cardiovascular: Normal rate, regular rhythm. Grossly normal heart sounds.  Good peripheral circulation. Respiratory: Normal respiratory effort.  No retractions. Lungs CTAB.  Patient has had a very deep wet cough Gastrointestinal: Soft and nontender. No distention. No abdominal bruits. Musculoskeletal: No lower extremity tenderness trace bilateral edema.   Neurologic:  Normal speech and language. No gross focal neurologic deficits are appreciated. No gait instability. Skin:  Skin  is warm, dry and intact. No rash noted.  ____________________________________________   LABS (all labs ordered are listed, but only abnormal results are displayed)  Labs Reviewed  COMPREHENSIVE METABOLIC PANEL - Abnormal; Notable for the following components:      Result Value   Glucose, Bld 108 (*)    BUN 31 (*)    All other components within normal limits  BRAIN NATRIURETIC PEPTIDE - Abnormal; Notable for the following components:   B Natriuretic Peptide 1,004.8 (*)    All other components within normal limits  CBC WITH DIFFERENTIAL/PLATELET - Abnormal; Notable for the following components:   WBC 12.8 (*)    RBC 4.07 (*)    Hemoglobin 12.0 (*)    HCT 35.0 (*)    Neutro Abs 8.6 (*)    Monocytes Absolute  1.6 (*)    All other components within normal limits  SARS CORONAVIRUS 2 BY RT PCR (HOSPITAL ORDER, [facility])  LACTIC ACID, PLASMA  LACTIC ACID, PLASMA  TROPONIN I (HIGH SENSITIVITY)  TROPONIN I (HIGH SENSITIVITY)   ____________________________________________  EKG  EKG read interpreted by me shows A. fib at 91 normal axis occasional PVCs right bundle branch block no acute ST-T wave changes EKG looks similar to 1 from December 2020 ____________________________________________  RADIOLOGY  ED MD interpretation:    Official radiology report(s): CT Chest Wo Contrast  Result Date: 10/08/2019 CLINICAL DATA:  Abnormal chest radiograph, possible left upper lobe mass EXAM: CT CHEST WITHOUT CONTRAST TECHNIQUE: Multidetector CT imaging of the chest was performed following the standard protocol without IV contrast. COMPARISON:  Same day chest radiograph, CT chest, 11/29/2015 FINDINGS: Cardiovascular: Aortic atherosclerosis. Aortic valve calcifications. Enlargement of the proximal descending thoracic aorta, measuring up to 3.6 x 3.3 cm, slightly enlarged compared to prior examination at which time it measured 3.2 x 3.2 cm. Normal heart size. Extensive 3  vessel coronary artery calcifications and/or stents. No pericardial effusion. Mediastinum/Nodes: No enlarged mediastinal, hilar, or axillary lymph nodes. Thyroid gland, trachea, and esophagus demonstrate no significant findings. Lungs/Pleura: Moderate to severe centrilobular and paraseptal emphysema. There is redemonstrated partially calcified biapical pleuroparenchymal scarring, which is not significantly changed compared to prior examination dated 11/29/2015. There is bronchial plugging in the dependent left lower lobe (series 3, image 78). There is minimal dependent bibasilar atelectasis and/or consolidation and trace pleural effusions. Upper Abdomen: No acute abnormality. Musculoskeletal: Osteopenia. There is a buckled fracture deformity of the mid sternal body with overlying soft tissue edema (series 6, image 88). There are multiple vertebral body wedge and endplate deformities, several which are new compared to prior examination dated 2017, particularly a high-grade wedge deformity of the T7 vertebral body, a more subtle superior endplate deformity of the T5 vertebral body, and an increased, now high-grade wedge deformity of the L2 vertebral body. IMPRESSION: 1. There is redemonstrated partially calcified biapical pleuroparenchymal scarring, which is not significantly changed compared to prior examination dated 2017 and accounts for radiographic abnormality. No evidence of mass. 2. Bronchial plugging in the dependent left lower lobe. Minimal dependent bibasilar atelectasis and/or consolidation and trace pleural effusions. Findings suggest aspiration. 3. There is a buckled fracture deformity of the mid sternal body with overlying soft tissue edema, new compared to prior examination. Correlate for recent injury an acute point tenderness. 4. There are multiple vertebral body wedge and endplate deformities, several which are new compared to prior examination dated 2017, particularly a high-grade wedge deformity of  the T7 vertebral body, a more subtle superior endplate deformity of the T5 vertebral body, and an increased, now high-grade wedge deformity of the L2 vertebral body. These are of uncertain acuity. Correlate for acute point tenderness. MRI may be used to assess for marrow edema and fracture acuity if desired. 5. Moderate to severe emphysema. Emphysema (ICD10-J43.9). 6. Coronary artery disease. 7. Aortic Atherosclerosis (ICD10-I70.0). Aortic valve calcifications. Enlargement of the proximal descending thoracic aorta, measuring up to 3.6 x 3.3 cm, slightly enlarged compared to prior examination at which time it measured 3.2 x 3.2 cm. Recommend annual imaging followup by CTA or MRA if clinically appropriate. This recommendation follows 2010 ACCF/AHA/AATS/ACR/ASA/SCA/SCAI/SIR/STS/SVM Guidelines for the Diagnosis and Management of Patients with Thoracic Aortic Disease. Circulation.2010; 121: X726-O035. Aortic aneurysm NOS (ICD10-I71.9) Electronically Signed   By: Eddie Candle M.D.   On: 10/08/2019 13:20  DG Chest Portable 1 View  Result Date: 10/08/2019 CLINICAL DATA:  Increasing shortness of breath and cough with left-sided chest pain EXAM: PORTABLE CHEST 1 VIEW COMPARISON:  04/05/2019 FINDINGS: Cardiac shadow is stable. Aortic calcifications are again seen. Biapical pleuroparenchymal scarring is noted. Some increasing density is noted in the left apex likely related to increasing scarring although the possibility of evolving mass or infiltrate could not be totally excluded. No sizable effusion is noted. No acute bony abnormality is seen. IMPRESSION: Increasing rounded density in the left apex when compared with the prior exam. The possibility of evolving infiltrate/mass could not be totally excluded. CT of the chest is recommended for further evaluation. Electronically Signed   By: Inez Catalina M.D.   On: 10/08/2019 12:17    ____________________________________________   PROCEDURES  Procedure(s) performed  (including Critical Care):  Procedures   ____________________________________________   INITIAL IMPRESSION / ASSESSMENT AND PLAN / ED COURSE  1 patient was moving any furniture in his living area seems like he sustained these fractures to his spine and sternum.  These are undoubtedly interfering with his ability to get enough oxygen.  Additionally he has some mucus plugging which appears to be consistent with aspiration on CT.  His white count is elevated.  He is requiring more oxygen than usual to maintain his saturations.  He is in a good deal of pain as well.  We will get him in the hospital and have some antibiotics going for him.  We will see what we can do to control his pain as well.            ____________________________________________   FINAL CLINICAL IMPRESSION(S) / ED DIAGNOSES  Final diagnoses:  Hypoxia  Dyspnea, unspecified type  Closed fracture of sternum, unspecified portion of sternum, initial encounter  Compression fracture of thoracic vertebra, initial encounter, unspecified thoracic vertebral level (HCC)  Compression fracture of lumbar vertebrae, non-traumatic, initial encounter (HCC)  Aspiration pneumonia, unspecified aspiration pneumonia type, unspecified laterality, unspecified part of lung (HCC)  Elevated brain natriuretic peptide (BNP) level     ED Discharge Orders    None      *Please note:  [name] was evaluated in Emergency Department on 10/08/2019 for the symptoms described in the history of present illness. He was evaluated in the context of the global COVID-19 pandemic, which necessitated consideration that the patient might be at risk for infection with the SARS-CoV-2 virus that causes COVID-19. Institutional protocols and algorithms that pertain to the evaluation of patients at risk for COVID-19 are in a state of rapid change based on information released by regulatory bodies including the CDC and federal and state organizations.  These policies and algorithms were followed during the patient's care in the ED.  Some ED evaluations and interventions may be delayed as a result of limited staffing during and the pandemic.*   Note:  This document was prepared using Dragon voice recognition software and may include unintentional dictation errors.    Nena Polio, MD 10/08/19 [phone]

## 2019-10-08 NOTE — H&P (Signed)
History and Physical    XADRIAN Good FUX:[id number] DOB: 1928/08/22 DOA: 10/08/2019  Referring MD/NP/PA:   PCP: Baxter Hire, MD   Patient coming from:  The patient is coming from home.  At baseline, pt is partially dependent for most of ADL.        Chief Complaint: SOB  HPI: [name] is a 86 y.o. male with medical history significant of hypertension, hyperlipidemia, COPD, on 2 L nasal cannula oxygen at home, CHF, CAD, atrial fibrillation on Coumadin, COVID-19 infection, who presents with shortness of breath.  Patient states that he has been having shortness breath and dry cough in the past several days, which has been progressively worsening.  Patient has some mild lower chest wall pain.  No fever or chills.  Patient denies nausea, vomiting, diarrhea, abdominal pain, symptoms of UTI or unilateral weakness.  No leg numbness.  Denies back pain. Patient has oxygen desaturation to 82% on home 2 L oxygen, which improved to 96% on 3 L oxygen in ED.    ED Course: pt was found to have BNP 1004, troponin 12, lactic acid 1.1, 0.9, WBC 12.8, negative Covid PCR, electrolytes renal function okay, temperature normal, blood pressure 202/66, 176/84, heart rate 99, RR 28. Pt is admitted to progressive bed as inpatient.  CXR: Increasing rounded density in the left apex when compared with the prior exam. The possibility of evolving infiltrate/mass could not be totally excluded.   CT-chest: 1. There is redemonstrated partially calcified biapical pleuroparenchymal scarring, which is not significantly changed compared to prior examination dated 2017 and accounts for radiographic abnormality. No evidence of mass.  2. Bronchial plugging in the dependent left lower lobe. Minimal dependent bibasilar atelectasis and/or consolidation and trace pleural effusions. Findings suggest aspiration.  3. There is a buckled fracture deformity of the mid sternal body with overlying soft tissue edema,  new compared to prior examination. Correlate for recent injury an acute point tenderness.  4. There are multiple vertebral body wedge and endplate deformities, several which are new compared to prior examination dated 2017, particularly a high-grade wedge deformity of the T7 vertebral body, a more subtle superior endplate deformity of the T5 vertebral body, and an increased, now high-grade wedge deformity of the L2 vertebral body. These are of uncertain acuity. Correlate for acute point tenderness. MRI may be used to assess for marrow edema and fracture acuity if desired. 5. Moderate to severe emphysema. Emphysema (ICD10-J43.9). 6. Coronary artery disease. 7. Aortic Atherosclerosis (ICD10-I70.0). Aortic valve calcifications. Enlargement of the proximal descending thoracic aorta, measuring up to 3.6 x 3.3 cm, slightly enlarged compared to prior examination at which time it measured 3.2 x 3.2 cm. Recommend annual imaging followup by CTA or MRA if clinically appropriate. This recommendation follows 2010      Review of Systems:   General: no fevers, chills, no body weight gain, has fatigue HEENT: no blurry vision, hearing changes or sore throat Respiratory: has dyspnea, coughing, wheezing CV: has lower chest wall pain, no palpitations GI: no nausea, vomiting, abdominal pain, diarrhea, constipation GU: no dysuria, burning on urination, increased urinary frequency, hematuria  Ext: has leg edema Neuro: no unilateral weakness, numbness, or tingling, no vision change or hearing loss Skin: no rash, no skin tear. MSK: No muscle spasm, no deformity, no limitation of range of movement in spin Heme: No easy bruising.  Travel history: No recent long distant travel.  Allergy: No Known Allergies  Past Medical History:  Diagnosis Date  . CHF (  congestive heart failure) ([place])   . COPD (chronic obstructive pulmonary disease) ([place])   . Coronary artery disease   . Hypertension     Past  Surgical History:  Procedure Laterality Date  . Renal Bypass Surgery      Social History:  reports that he has quit smoking. He has never used smokeless tobacco. He reports that he does not drink alcohol and does not use drugs.  Family History: No family history on file.   Prior to Admission medications   Medication Sig Start Date End Date Taking? Authorizing Provider  albuterol (PROVENTIL) (2.5 MG/3ML) 0.083% nebulizer solution Take 3 mLs (2.5 mg total) by nebulization every 6 (six) hours as needed for wheezing or shortness of breath. 01/31/19  Yes Danford, Suann Larry, MD  albuterol (VENTOLIN HFA) 108 (90 Base) MCG/ACT inhaler Inhale into the lungs every 6 (six) hours as needed for wheezing or shortness of breath.   Yes [provider]  atorvastatin (LIPITOR) 20 MG tablet Take 20 mg by mouth daily.   Yes [provider]  bisacodyl (DULCOLAX) 5 MG EC tablet Take 5-15 mg by mouth daily as needed for moderate constipation.   Yes [provider]  CARTIA XT 300 MG 24 hr capsule Take 300 mg by mouth daily. 09/16/19  Yes [provider]  fexofenadine (ALLEGRA) 180 MG tablet Take 180 mg by mouth daily.   Yes [provider]  Fluticasone-Umeclidin-Vilant (TRELEGY ELLIPTA) 100-62.5-25 MCG/INH AEPB Inhale 1 puff into the lungs daily.    Yes [provider]  furosemide (LASIX) 20 MG tablet TAKE 1 TABLET BY MOUTH DAILY 09/16/19  Yes [name] A, FNP  metoprolol tartrate (LOPRESSOR) 50 MG tablet Take 50 mg by mouth 2 (two) times daily.   Yes [provider]  mometasone (NASONEX) 50 MCG/ACT nasal spray Place 1 spray into the nose daily. Each nostril 01/19/19 01/19/20 Yes [provider]  potassium chloride (KLOR-CON) 10 MEQ tablet Take 10 mEq by mouth daily.   Yes [provider]  predniSONE (DELTASONE) 10 MG tablet Take 5 mg by mouth daily with breakfast.   Yes [provider]  traZODone (DESYREL) 50 MG tablet Take  50 mg by mouth at bedtime as needed for sleep.  11/18/18 11/18/19 Yes [provider]  warfarin (COUMADIN) 1 MG tablet Take 1 mg by mouth daily.   Yes [provider]  warfarin (COUMADIN) 2 MG tablet Take 2 mg by mouth daily.    Yes [provider]    Physical Exam: Vitals:   10/08/19 1545 10/08/19 1643 10/08/19 1654 10/08/19 1657  BP: 104/77 (!) 157/93    Pulse: 96 84    Resp: 17 (!) 21    Temp:  98.5 F (36.9 C)    TempSrc:  Oral    SpO2: 91% 91% 92%   Weight:    65.6 kg  Height:    5\' 10"  (1.778 m)   General: Not in acute distress HEENT:       Eyes: PERRL, EOMI, no scleral icterus.       ENT: No discharge from the ears and nose, no pharynx injection, no tonsillar enlargement.        Neck: postive JVD, no bruit, no mass felt. Heme: No neck lymph node enlargement. Cardiac: S1/S2, RRR, No murmurs, No gallops or rubs. Respiratory: Has mild wheezing on the left side GI: Soft, nondistended, nontender, no rebound pain, no organomegaly, BS present. GU: No hematuria Ext: has 1+ pitting leg edema  bilaterally. 1+DP/PT pulse bilaterally. Musculoskeletal: No joint deformities, No joint redness or warmth, no limitation of ROM in spin. Skin: No rashes.  Neuro: Alert, oriented X3, cranial nerves II-XII grossly intact, moves all extremities normally. Psych: Patient is not psychotic, no suicidal or hemocidal ideation.  Labs on Admission: I have personally reviewed following labs and imaging studies  CBC: Recent Labs  Lab 10/08/19 1150  WBC 12.8*  NEUTROABS 8.6*  HGB 12.0*  HCT 35.0*  MCV 86.0  PLT 301   Basic Metabolic Panel: Recent Labs  Lab 10/08/19 1150  NA 135  K 4.4  CL 99  CO2 28  GLUCOSE 108*  BUN 31*  CREATININE 1.04  CALCIUM 9.1   GFR: Estimated Creatinine Clearance: 44.7 mL/min (by C-G formula based on SCr of 1.04 mg/dL). Liver Function Tests: Recent Labs  Lab 10/08/19 1150  AST 19  ALT 14  ALKPHOS 66  BILITOT 1.1  PROT 7.6   ALBUMIN 4.0   No results for input(s): LIPASE, AMYLASE in the last 168 hours. No results for input(s): AMMONIA in the last 168 hours. Coagulation Profile: No results for input(s): INR, PROTIME in the last 168 hours. Cardiac Enzymes: No results for input(s): CKTOTAL, CKMB, CKMBINDEX, TROPONINI in the last 168 hours. BNP (last 3 results) No results for input(s): PROBNP in the last 8760 hours. HbA1C: No results for input(s): HGBA1C in the last 72 hours. CBG: No results for input(s): GLUCAP in the last 168 hours. Lipid Profile: No results for input(s): CHOL, HDL, LDLCALC, TRIG, CHOLHDL, LDLDIRECT in the last 72 hours. Thyroid Function Tests: No results for input(s): TSH, T4TOTAL, FREET4, T3FREE, THYROIDAB in the last 72 hours. Anemia Panel: No results for input(s): VITAMINB12, FOLATE, FERRITIN, TIBC, IRON, RETICCTPCT in the last 72 hours. Urine analysis:    Component Value Date/Time   COLORURINE YELLOW (A) 01/04/2019 1206   APPEARANCEUR CLEAR (A) 01/04/2019 1206   LABSPEC 1.018 01/04/2019 1206   PHURINE 6.0 01/04/2019 1206   GLUCOSEU NEGATIVE 01/04/2019 1206   HGBUR NEGATIVE 01/04/2019 1206   BILIRUBINUR NEGATIVE 01/04/2019 1206   KETONESUR 5 (A) 01/04/2019 1206   PROTEINUR 100 (A) 01/04/2019 1206   NITRITE NEGATIVE 01/04/2019 1206   LEUKOCYTESUR NEGATIVE 01/04/2019 1206   Sepsis Labs: @LABRCNTIP (procalcitonin:4,lacticidven:4) ) Recent Results (from the past 240 hour(s))  SARS Coronavirus 2 by RT PCR (hospital order, performed in [facility] hospital lab) Nasopharyngeal Nasopharyngeal Swab     Status: None   Collection Time: 10/08/19 11:50 AM   Specimen: Nasopharyngeal Swab  Result Value Ref Range Status   SARS Coronavirus 2 NEGATIVE NEGATIVE Final    Comment: (NOTE) SARS-CoV-2 target nucleic acids are NOT DETECTED.  The SARS-CoV-2 RNA is generally detectable in upper and lower respiratory specimens during the acute phase of infection. The lowest concentration of  SARS-CoV-2 viral copies this assay can detect is 250 copies / mL. A negative result does not preclude SARS-CoV-2 infection and should not be used as the sole basis for treatment or other patient management decisions.  A negative result may occur with improper specimen collection / handling, submission of specimen other than nasopharyngeal swab, presence of viral mutation(s) within the areas targeted by this assay, and inadequate number of viral copies (<250 copies / mL). A negative result must be combined with clinical observations, patient history, and epidemiological information.  Fact Sheet for Patients:   StrictlyIdeas.no  Fact Sheet for Healthcare Providers: BankingDealers.co.za  This test is not yet approved or  cleared by the [place] FDA  and has been authorized for detection and/or diagnosis of SARS-CoV-2 by FDA under an Emergency Use Authorization (EUA).  This EUA will remain in effect (meaning this test can be used) for the duration of the COVID-19 declaration under Section 564(b)(1) of the Act, 21 U.S.C. section 360bbb-3(b)(1), unless the authorization is terminated or revoked sooner.  Performed at [facility], [address] [place], [place] [address]      Radiological Exams on Admission: CT Chest Wo Contrast  Result Date: 10/08/2019 CLINICAL DATA:  Abnormal chest radiograph, possible left upper lobe mass EXAM: CT CHEST WITHOUT CONTRAST TECHNIQUE: Multidetector CT imaging of the chest was performed following the standard protocol without IV contrast. COMPARISON:  Same day chest radiograph, CT chest, 11/29/2015 FINDINGS: Cardiovascular: Aortic atherosclerosis. Aortic valve calcifications. Enlargement of the proximal descending thoracic aorta, measuring up to 3.6 x 3.3 cm, slightly enlarged compared to prior examination at which time it measured 3.2 x 3.2 cm. Normal heart size. Extensive 3 vessel coronary artery  calcifications and/or stents. No pericardial effusion. Mediastinum/Nodes: No enlarged mediastinal, hilar, or axillary lymph nodes. Thyroid gland, trachea, and esophagus demonstrate no significant findings. Lungs/Pleura: Moderate to severe centrilobular and paraseptal emphysema. There is redemonstrated partially calcified biapical pleuroparenchymal scarring, which is not significantly changed compared to prior examination dated 11/29/2015. There is bronchial plugging in the dependent left lower lobe (series 3, image 78). There is minimal dependent bibasilar atelectasis and/or consolidation and trace pleural effusions. Upper Abdomen: No acute abnormality. Musculoskeletal: Osteopenia. There is a buckled fracture deformity of the mid sternal body with overlying soft tissue edema (series 6, image 88). There are multiple vertebral body wedge and endplate deformities, several which are new compared to prior examination dated 2017, particularly a high-grade wedge deformity of the T7 vertebral body, a more subtle superior endplate deformity of the T5 vertebral body, and an increased, now high-grade wedge deformity of the L2 vertebral body. IMPRESSION: 1. There is redemonstrated partially calcified biapical pleuroparenchymal scarring, which is not significantly changed compared to prior examination dated 2017 and accounts for radiographic abnormality. No evidence of mass. 2. Bronchial plugging in the dependent left lower lobe. Minimal dependent bibasilar atelectasis and/or consolidation and trace pleural effusions. Findings suggest aspiration. 3. There is a buckled fracture deformity of the mid sternal body with overlying soft tissue edema, new compared to prior examination. Correlate for recent injury an acute point tenderness. 4. There are multiple vertebral body wedge and endplate deformities, several which are new compared to prior examination dated 2017, particularly a high-grade wedge deformity of the T7 vertebral body,  a more subtle superior endplate deformity of the T5 vertebral body, and an increased, now high-grade wedge deformity of the L2 vertebral body. These are of uncertain acuity. Correlate for acute point tenderness. MRI may be used to assess for marrow edema and fracture acuity if desired. 5. Moderate to severe emphysema. Emphysema (ICD10-J43.9). 6. Coronary artery disease. 7. Aortic Atherosclerosis (ICD10-I70.0). Aortic valve calcifications. Enlargement of the proximal descending thoracic aorta, measuring up to 3.6 x 3.3 cm, slightly enlarged compared to prior examination at which time it measured 3.2 x 3.2 cm. Recommend annual imaging followup by CTA or MRA if clinically appropriate. This recommendation follows 2010 ACCF/AHA/AATS/ACR/ASA/SCA/SCAI/SIR/STS/SVM Guidelines for the Diagnosis and Management of Patients with Thoracic Aortic Disease. Circulation.2010; 121: F027-X412. Aortic aneurysm NOS (ICD10-I71.9) Electronically Signed   By: Eddie Candle M.D.   On: 10/08/2019 13:20   DG Chest Portable 1 View  Result Date: 10/08/2019 CLINICAL DATA:  Increasing shortness  of breath and cough with left-sided chest pain EXAM: PORTABLE CHEST 1 VIEW COMPARISON:  04/05/2019 FINDINGS: Cardiac shadow is stable. Aortic calcifications are again seen. Biapical pleuroparenchymal scarring is noted. Some increasing density is noted in the left apex likely related to increasing scarring although the possibility of evolving mass or infiltrate could not be totally excluded. No sizable effusion is noted. No acute bony abnormality is seen. IMPRESSION: Increasing rounded density in the left apex when compared with the prior exam. The possibility of evolving infiltrate/mass could not be totally excluded. CT of the chest is recommended for further evaluation. Electronically Signed   By: Inez Catalina M.D.   On: 10/08/2019 12:17     EKG: Independently reviewed.  Atrial fibrillation, PVC, right bundle blockade, early R wave  progression  Assessment/Plan Principal Problem:   Acute on chronic respiratory failure with hypoxia (HCC) Active Problems:   Hypertension   Coronary artery disease   HLD (hyperlipidemia)   Atrial fibrillation, chronic (HCC)   Acute on chronic diastolic CHF (congestive heart failure) (HCC)   COPD exacerbation (HCC)   Aspiration pneumonia (HCC)   Sepsis (HCC)   Acute on chronic diastolic (congestive) heart failure (HCC)   Acute on chronic respiratory failure with hypoxia ([place]): Likely due to multifactorial etiology, including acute on chronic dCHF exacerbation, possible aspiration pneumonia and a COPD exacerbation.  Patient has leg edema, positive JVD, elevated BNP 1004, consistent with CHF exacerbation.  Patient has mild wheezing indicating COPD exacerbation.  CT scan showed possible aspiration pneumonia.  -Admitted to progressive bed as inpatient -Bronchodilators, -Unasyn for aspiration pneumonia -IV Lasix for CHF exacerbation -Nasal cannula oxygen to maintain oxygen saturation above 93%  Aspiration pneumonia and sepsis [facility]): Patient admits critical for sepsis with WBC 12.8, heart rate 99, RR 26.  Lactic acid are normal.  Currently hemodynamically stable. -Unasyn -Follow-up sputum culture, blood culture.  COPD exacerbation: -Bronchodilators -As needed Mucinex for cough -Patient is on Unasyn -Solu-Medrol 40 mg twice daily  Acute on chronic diastolic CHF: 2D echo on 22/0/2542 showed EF of 55-60% -IV Lasix 20 mg twice daily -Low-salt -Fluid restriction -2D echo  Hypertension -Patient is on Lasix -IV hydralazine as needed -Cardia -Metoprolol -Newly added lisinopril 2.5 mg daily for CHF  Coronary artery disease -Continue Lipitor  HLD (hyperlipidemia) -Lipitor  Atrial fibrillation, chronic (HCC) -Metoprolol -Coumadin per pharm  Buckled fracture deformity of the mid sternal body: as shown by CT-chest -prn percocet  Deformity of T- and L-vertebral: CT of chest  showed multiple vertebral body wedge and endplate deformities, several which are new compared to prior examination dated 2017, particularly a high-grade wedge deformity of the T7 vertebral body, a more subtle superior endplate deformity of the T5 vertebral body, and an increased, now high-grade wedge deformity of the L2 vertebral body.  Patient denies back pain to me. -will get MRI of T- and L spin for further evaluation.      DVT ppx: on coumadin Code Status: DNR per patient and her daughter Family Communication: es, patient's  daughter by phone  Disposition Plan:  Anticipate discharge back to previous environment Consults called:  none Admission status: progressive unit as inpt       Status is: Inpatient  Remains inpatient appropriate because:Inpatient level of care appropriate due to severity of illness.  Patient has multiple comorbidities, now presents with acute on chronic respiratory failure due to CHF exacerbation, possible aspiration pneumonia and COPD exacerbation.  Patient also meets criteria for sepsis.  His presentation is highly  complicated.  Patient is at high risk of deteriorating given his old age.  Will need to be treated in hospital for at least 2 days.   Dispo: The patient is from: Home              Anticipated d/c is to: Home              Anticipated d/c date is: 2 days              Patient currently is not medically stable to d/c.           Date of Service 10/08/2019    Ivor Costa [facility]   If 7PM-7AM, please contact night-coverage [email or web address] 10/08/2019, 6:02 PM

## 2019-10-08 NOTE — Consult Note (Signed)
ANTICOAGULATION CONSULT NOTE - Initial Consult  Pharmacy Consult for warfarin Indication: atrial fibrillation  No Known Allergies  Patient Measurements: Height: 5\' 10"  (177.8 cm) Weight: 65.6 kg (144 lb 11.2 oz) IBW/kg (Calculated) : 73  Vital Signs: Temp: 98.5 F (36.9 C) (09/10 1643) Temp Source: Oral (09/10 1643) BP: 157/93 (09/10 1643) Pulse Rate: 84 (09/10 1643)  Labs: Recent Labs    10/08/19 1150 10/08/19 1416 10/08/19 1831  HGB 12.0*  --   --   HCT 35.0*  --   --   PLT 209  --   --   LABPROT  --   --  25.4*  INR  --   --  2.4*  CREATININE 1.04  --   --   TROPONINIHS 12 16  --     Estimated Creatinine Clearance: 44.7 mL/min (by C-G formula based on SCr of 1.04 mg/dL).   Medical History: Past Medical History:  Diagnosis Date  . CHF (congestive heart failure) ([place])   . COPD (chronic obstructive pulmonary disease) ([place])   . Coronary artery disease   . Hypertension     Medications:  Medications Prior to Admission  Medication Sig Dispense Refill Last Dose  . albuterol (PROVENTIL) (2.5 MG/3ML) 0.083% nebulizer solution Take 3 mLs (2.5 mg total) by nebulization every 6 (six) hours as needed for wheezing or shortness of breath. 75 mL 0 PRN at PRN  . albuterol (VENTOLIN HFA) 108 (90 Base) MCG/ACT inhaler Inhale into the lungs every 6 (six) hours as needed for wheezing or shortness of breath.   PRN at PRN  . atorvastatin (LIPITOR) 20 MG tablet Take 20 mg by mouth daily.   10/07/2019 at Unknown time  . CARTIA XT 300 MG 24 hr capsule Take 300 mg by mouth daily.   10/07/2019 at Unknown time  . fexofenadine (ALLEGRA) 180 MG tablet Take 180 mg by mouth daily.   PRN at PRN  . Fluticasone-Umeclidin-Vilant (TRELEGY ELLIPTA) 100-62.5-25 MCG/INH AEPB Inhale 1 puff into the lungs daily.    10/07/2019 at Unknown time  . furosemide (LASIX) 20 MG tablet TAKE 1 TABLET BY MOUTH DAILY 30 tablet 5 10/07/2019 at Unknown time  . metoprolol tartrate (LOPRESSOR) 50 MG tablet Take 50 mg by mouth  2 (two) times daily.   10/07/2019 at Unknown time  . mometasone (NASONEX) 50 MCG/ACT nasal spray Place 1 spray into the nose daily. Each nostril   Past Week at PRN  . potassium chloride (KLOR-CON) 10 MEQ tablet Take 10 mEq by mouth daily.   10/07/2019 at Unknown time  . predniSONE (DELTASONE) 10 MG tablet Take 5 mg by mouth daily with breakfast.   10/07/2019 at Unknown time  . traZODone (DESYREL) 50 MG tablet Take 50 mg by mouth at bedtime as needed for sleep.    Past Month at PRN  . warfarin (COUMADIN) 2 MG tablet Take 2 mg by mouth daily.    10/07/2019 at 1800  . warfarin (COUMADIN) 1 MG tablet Take 1 mg by mouth daily.   10/04/2019    Assessment: 86 year old male presenting with cough, chest pain, and shortness of breath. Pt admitted for acute on chronic respiratory failure. CT showing possible aspiration pneumonia. PMH includes atrial fibrillation, CHF, COPD, CAD, and HTN. Pharmacy has been consulted to restart home warfarin. Pt was taking 2 mg daily prior to admission.   Goal of Therapy:  INR 2-3 Monitor platelets by anticoagulation protocol: Yes   Date INR Dose  9/10 2.4 2 mg  Plan:  Warfarin 2 mg  Monitor for drug interactions with warfarin Re-check INR and CBC daily with AM labs  Benn Moulder, PharmD Pharmacy Resident  10/08/2019 7:16 PM

## 2019-10-08 NOTE — Progress Notes (Signed)
Pharmacy Antibiotic Note  IVEN EARNHART is a 86 y.o. male admitted on 10/08/2019. Pharmacy has been consulted for Unasyn dosing for aspiration pneumonia.  Plan: Unasyn 3 g IV q6h  Height: 5\' 10"  (177.8 cm) Weight: 64.4 kg (142 lb) IBW/kg (Calculated) : 73  Temp (24hrs), Avg:98.4 F (36.9 C), Min:98.4 F (36.9 C), Max:98.4 F (36.9 C)  Recent Labs  Lab 10/08/19 1150 10/08/19 1416  WBC 12.8*  --   CREATININE 1.04  --   LATICACIDVEN 1.1 0.9    Estimated Creatinine Clearance: 43.9 mL/min (by C-G formula based on SCr of 1.04 mg/dL).    No Known Allergies  Antimicrobials this admission: Unasyn 9/10 >>   Microbiology results: 9/10 BCx: pending   Thank you for allowing pharmacy to be a part of this patient's care.  Tawnya Crook, PharmD 10/08/2019 3:00 PM

## 2019-10-08 NOTE — ED Triage Notes (Signed)
Pt here via [facility] from home.  Per EMS, pt has been experiencing increasing shortness of breath with a productive cough x 6 days. Pt also has left sided chest pressure that his cardiologist believes is a muscle injury. EKG shows afib w/ pvcs.  RA sats 82%. Pt usually wears 2L chronic [place], ems places pt on 3L with sats of 94%. RR 20-26, CO2 29, HR 90, BP 176/76.

## 2019-10-08 NOTE — Progress Notes (Signed)
Update given to daughter [name].

## 2019-10-09 ENCOUNTER — Inpatient Hospital Stay: Payer: Medicare Other

## 2019-10-09 ENCOUNTER — Inpatient Hospital Stay: Admit: 2019-10-09 | Payer: Medicare Other

## 2019-10-09 LAB — BLOOD CULTURE ID PANEL (REFLEXED) - BCID2

## 2019-10-09 LAB — BASIC METABOLIC PANEL
Anion gap: 10 (ref 5–15)
BUN: 33 mg/dL — ABNORMAL HIGH (ref 8–23)
CO2: 26 mmol/L (ref 22–32)
Calcium: 8.8 mg/dL — ABNORMAL LOW (ref 8.9–10.3)
Chloride: 98 mmol/L (ref 98–111)
Creatinine, Ser: 1.37 mg/dL — ABNORMAL HIGH (ref 0.61–1.24)
GFR calc Af Amer: 53 mL/min — ABNORMAL LOW (ref >60)
GFR calc non Af Amer: 45 mL/min — ABNORMAL LOW (ref >60)
Glucose, Bld: 152 mg/dL — ABNORMAL HIGH (ref 70–99)
Potassium: 4.6 mmol/L (ref 3.5–5.1)
Sodium: 134 mmol/L — ABNORMAL LOW (ref 135–145)

## 2019-10-09 LAB — CBC
HCT: 34.7 % — ABNORMAL LOW (ref 39.0–52.0)
Hemoglobin: 11.6 g/dL — ABNORMAL LOW (ref 13.0–17.0)
MCH: 29.6 pg (ref 26.0–34.0)
MCHC: 33.4 g/dL (ref 30.0–36.0)
MCV: 88.5 fL (ref 80.0–100.0)
Platelets: 195 10*3/uL (ref 150–400)
RBC: 3.92 MIL/uL — ABNORMAL LOW (ref 4.22–5.81)
RDW: 15.1 % (ref 11.5–15.5)
WBC: 13 10*3/uL — ABNORMAL HIGH (ref 4.0–10.5)
nRBC: 0 % (ref 0.0–0.2)

## 2019-10-09 LAB — PROTIME-INR
INR: 2.5 — ABNORMAL HIGH (ref 0.8–1.2)
Prothrombin Time: 26.5 seconds — ABNORMAL HIGH (ref 11.4–15.2)

## 2019-10-09 LAB — MAGNESIUM: Magnesium: 2.2 mg/dL (ref 1.7–2.4)

## 2019-10-09 MED ORDER — IPRATROPIUM-ALBUTEROL 0.5-2.5 (3) MG/3ML IN SOLN
3.0000 mL | Freq: Four times a day (QID) | RESPIRATORY_TRACT | Status: DC
  Administered 2019-10-09 (×2): 3 mL via RESPIRATORY_TRACT
  Filled 2019-10-09 (×2): qty 3

## 2019-10-09 MED ORDER — LIDOCAINE 5 % EX PTCH
1.0000 | MEDICATED_PATCH | CUTANEOUS | Status: DC
  Administered 2019-10-09 – 2019-10-14 (×6): 1 via TRANSDERMAL
  Filled 2019-10-09 (×8): qty 1

## 2019-10-09 MED ORDER — WARFARIN SODIUM 2 MG PO TABS
2.0000 mg | ORAL_TABLET | Freq: Once | ORAL | Status: AC
  Administered 2019-10-09: 2 mg via ORAL
  Filled 2019-10-09: qty 1

## 2019-10-09 MED ORDER — DM-GUAIFENESIN ER 30-600 MG PO TB12
1.0000 | ORAL_TABLET | Freq: Two times a day (BID) | ORAL | Status: DC
  Administered 2019-10-09 – 2019-10-14 (×11): 1 via ORAL
  Filled 2019-10-09 (×11): qty 1

## 2019-10-09 NOTE — Progress Notes (Addendum)
PHARMACY - PHYSICIAN COMMUNICATION CRITICAL VALUE ALERT - BLOOD CULTURE IDENTIFICATION (BCID)  [name] is an 86 y.o. male who presented to [facility] on 10/08/2019 with a chief complaint of SOB  Assessment: 4/4 bottles staph species (methicillin resistance testing pending)  Name of physician (or Provider) Contacted: Dr. Arbutus Ped  Current antibiotics: Unasyn  Changes to prescribed antibiotics recommended: recommend vanc, patient not ill-appearing at this time so monitor on current therapy   Results for orders placed or performed during the hospital encounter of 10/08/19  Blood Culture ID Panel (Reflexed) (Collected: 10/08/2019  3:45 PM)  Result Value Ref Range   Enterococcus faecalis NOT DETECTED NOT DETECTED   Enterococcus Faecium NOT DETECTED NOT DETECTED   Listeria monocytogenes NOT DETECTED NOT DETECTED   Staphylococcus species DETECTED (A) NOT DETECTED   Staphylococcus aureus (BCID) NOT DETECTED NOT DETECTED   Staphylococcus epidermidis NOT DETECTED NOT DETECTED   Staphylococcus lugdunensis NOT DETECTED NOT DETECTED   Streptococcus species NOT DETECTED NOT DETECTED   Streptococcus agalactiae NOT DETECTED NOT DETECTED   Streptococcus pneumoniae NOT DETECTED NOT DETECTED   Streptococcus pyogenes NOT DETECTED NOT DETECTED   A.calcoaceticus-baumannii NOT DETECTED NOT DETECTED   Bacteroides fragilis NOT DETECTED NOT DETECTED   Enterobacterales NOT DETECTED NOT DETECTED   Enterobacter cloacae complex NOT DETECTED NOT DETECTED   Escherichia coli NOT DETECTED NOT DETECTED   Klebsiella aerogenes NOT DETECTED NOT DETECTED   Klebsiella oxytoca NOT DETECTED NOT DETECTED   Klebsiella pneumoniae NOT DETECTED NOT DETECTED   Proteus species NOT DETECTED NOT DETECTED   Salmonella species NOT DETECTED NOT DETECTED   Serratia marcescens NOT DETECTED NOT DETECTED   Haemophilus influenzae NOT DETECTED NOT DETECTED   Neisseria meningitidis NOT DETECTED NOT DETECTED   Pseudomonas  aeruginosa NOT DETECTED NOT DETECTED   Stenotrophomonas maltophilia NOT DETECTED NOT DETECTED   Candida albicans NOT DETECTED NOT DETECTED   Candida auris NOT DETECTED NOT DETECTED   Candida glabrata NOT DETECTED NOT DETECTED   Candida krusei NOT DETECTED NOT DETECTED   Candida parapsilosis NOT DETECTED NOT DETECTED   Candida tropicalis NOT DETECTED NOT DETECTED   Cryptococcus neoformans/gattii NOT DETECTED NOT [place], PharmD 10/09/2019  10:46 AM

## 2019-10-09 NOTE — Hospital Course (Addendum)
[name] is a 86 y.o. male with medical history of hypertension, hyperlipidemia, COPD, on 2 L/min oxygen at home, CHF, CAD, atrial fibrillation on Coumadin, COVID-19 infection, who presented to ED on 10/08/19 with progressive shortness of breath, wet sounding cough but without sputum production, mid-anterior chest wall pain, and O2 desats to low 80's on his usual 2 L/min.    No fever or chills.  Onset of his pain occurred about a week ago, acutely when he got up from seated to pull a chair towards himself to put his legs up, as he always does.  He reported feeling "something pulling from my tummy up to my chest".  Evaluation in the ED - BNP elevated 1004, troponin and lactate normal, leukocytosis WBC 12.8k, negative Covid-19 PCR.  BP uncontrolled 202/66 >> 176/84, afebrile, HR 99, tachypneic RR 28.   Imaging -  CXR Increasing rounded density in the left apex when compared with the prior exam, possibly evolving infiltrate/mass.  CT Chest -  1. ...partially calcified biapical pleuroparenchymal scarring, which is not significantly changed compared to prior examination dated 2017 and accounts for radiographic abnormality. No evidence of mass. 2. Bronchial plugging in the dependent left lower lobe. Minimal dependent bibasilar atelectasis and/or consolidation and trace pleural effusions. Findings suggest aspiration. 3. There is a buckled fracture deformity of the mid sternal body with overlying soft tissue edema, new compared to prior examination.  Correlate for recent injury an acute point tenderness.  4. There are multiple vertebral body wedge and endplate deformities, several which are new compared to prior examination dated 2017, particularly a high-grade wedge deformity of the T7 vertebral body, a more subtle superior endplate deformity of the T5 vertebral body, and an increased, now high-grade wedge deformity of the L2 vertebral body. These are of uncertain acuity. Correlate for acute  point tenderness. MRI may be used to assess for marrow edema and fracture acuity if desired.  5. Moderate to severe emphysema. 6. Coronary artery disease. 7. Aortic Atherosclerosis (ICD10-I70.0). Aortic valve calcifications. Enlargement of the proximal descending thoracic aorta, measuring up to 3.6 x 3.3 cm, slightly enlarged compared to prior examination at which time it measured 3.2 x 3.2 cm. Recommend annual imaging followup by CTA or MRA if clinically appropriate.

## 2019-10-09 NOTE — TOC Initial Note (Signed)
Transition of Care [facility]) - Initial/Assessment Note    Patient Details  Name: [name] MRN: [id number] Date of Birth: 1929/01/13  Transition of Care [facility]) CM/SW Contact:    Christopher Masson, RN Phone Number: [phone] 10/09/2019, 4:48 PM  Clinical Narrative:                 Pt evaluate by PT/OT with recommendations for [facility] to service this pt. RN spoke with daughter and pt concerning agency of choice. Chosen initially was [facility] however agency unable to accomodate pt's home health needs. Discussed other agencies as daughter okay with any agency at this time. Other agency called [facility]), [name] ([name]) left voice messages. Also spoke with [name] at [facility] who will follow up on tomorrow with final arrangement if agency is able to accommodate pt's care. Will confirm agency and alert team with update once pending [facility]Health is set up.   TOC RN also at bedside and initiated Heart Failure screening and education as requested. Will request bedside RN  to include HF information in discharge packet.    Expected Discharge Plan: [place] Barriers to Discharge: Continued Medical Work up   Patient Goals and CMS Choice     Choice offered to / list presented to : Patient, Sibling  Expected Discharge Plan and Services Expected Discharge Plan: [place] Acute Care Choice: [place] arrangements for the past 2 months: Single Family Home                           HH Arranged: OT, PT Central Agency: [facility] Date [place]: 10/09/19 Time [place]: [place]    Prior Living Arrangements/Services Living arrangements for the past 2 months: [place] Lives with:: Self Patient language and need for interpreter reviewed:: Yes Do you feel safe going back to the place where you live?: Yes      Need for Family Participation in Patient Care: Yes (Comment) Care giver  support system in place?: Yes (comment)   Criminal Activity/Legal Involvement Pertinent to Current Situation/Hospitalization: No - Comment as needed  Activities of Daily Living Home Assistive Devices/Equipment: Cane (specify quad or straight) ADL Screening (condition at time of admission) [name] cognitive ability adequate to safely complete daily activities?: Yes Is the patient deaf or have difficulty hearing?: No Does the patient have difficulty seeing, even when wearing glasses/contacts?: No Does the patient have difficulty concentrating, remembering, or making decisions?: No Patient able to express need for assistance with ADLs?: Yes Does the patient have difficulty dressing or bathing?: No Independently performs ADLs?: Yes (appropriate for developmental age) Does the patient have difficulty walking or climbing stairs?: No Weakness of Legs: None Weakness of Arms/Hands: None  Permission Sought/Granted Permission sought to share information with : Facility Art therapist granted to share information with : Yes, Verbal Permission Granted  Share Information with NAME: [name] (Daughter at bedside)           Emotional Assessment Appearance:: Appears stated age Attitude/Demeanor/Rapport: Engaged Affect (typically observed): Accepting Orientation: : Oriented to Self, Oriented to Place, Oriented to Situation Alcohol / Substance Use: Not Applicable Psych Involvement: No (comment)  Admission diagnosis:  Hypoxia [R09.02] Elevated brain natriuretic peptide (BNP) level [R79.89] Compression fracture of lumbar vertebrae, non-traumatic, initial encounter (HCC) [M48.56XA] Acute on chronic respiratory failure with hypoxia (HCC) [J96.21] Acute on chronic diastolic (  congestive) heart failure (HCC) [I50.33] Dyspnea, unspecified type [R06.00] Closed fracture of sternum, unspecified portion of sternum, initial encounter [S22.20XA] Aspiration pneumonia, unspecified  aspiration pneumonia type, unspecified laterality, unspecified part of lung ([place]) [J69.0] Compression fracture of thoracic vertebra, initial encounter, unspecified thoracic vertebral level ([place]) [S22.000A] Patient Active Problem List   Diagnosis Date Noted  . Acute on chronic respiratory failure with hypoxia ([place]) 10/08/2019  . Acute on chronic diastolic (congestive) heart failure ([place]) 10/08/2019  . COPD exacerbation ([place])   . Aspiration pneumonia ([place])   . Sepsis ([place])   . Closed fracture of sternum   . Compression fracture of thoracic vertebra (HCC)   . Acute on chronic diastolic CHF (congestive heart failure) ([place]) 01/27/2019  . Acute respiratory failure with hypoxia ([place]) 01/24/2019  . Pneumonia due to COVID-19 virus 01/24/2019  . Atrial fibrillation, chronic ([place]) 01/24/2019  . Atrial fibrillation with RVR ([place]) 01/04/2019  . Constipation 01/04/2019  . HLD (hyperlipidemia) 01/04/2019  . Hypertension   . Coronary artery disease   . Elevated troponin   . COVID-19    PCP:  Baxter Hire, MD Pharmacy:   [facility] [address] [place] [address] Phone: [phone] Fax: [phone]  [id number] - [place], [address] [place] [address] Phone: [phone] Fax: [phone]     Social Determinants of Health (SDOH) Interventions    Readmission Risk Interventions Readmission Risk Prevention Plan 01/31/2019  Transportation Screening Complete  Some recent data might be hidden

## 2019-10-09 NOTE — Consult Note (Signed)
ANTICOAGULATION CONSULT NOTE - Initial Consult  Pharmacy Consult for warfarin Indication: atrial fibrillation  No Known Allergies  Patient Measurements: Height: 5\' 10"  (177.8 cm) Weight: 62.5 kg (137 lb 12.6 oz) IBW/kg (Calculated) : 73  Vital Signs: Temp: 98.7 F (37.1 C) (09/11 0732) Temp Source: Oral (09/11 0732) BP: 133/77 (09/11 0732) Pulse Rate: 76 (09/11 0732)  Labs: Recent Labs    10/08/19 1150 10/08/19 1416 10/08/19 1831 10/09/19 0358  HGB 12.0*  --   --  11.6*  HCT 35.0*  --   --  34.7*  PLT 209  --   --  195  LABPROT  --   --  25.4* 26.5*  INR  --   --  2.4* 2.5*  CREATININE 1.04  --   --  1.37*  TROPONINIHS 12 16  --   --     Estimated Creatinine Clearance: 32.3 mL/min (A) (by C-G formula based on SCr of 1.37 mg/dL (H)).   Medical History: Past Medical History:  Diagnosis Date  . CHF (congestive heart failure) ([place])   . COPD (chronic obstructive pulmonary disease) ([place])   . Coronary artery disease   . Hypertension     Medications:  Medications Prior to Admission  Medication Sig Dispense Refill Last Dose  . albuterol (PROVENTIL) (2.5 MG/3ML) 0.083% nebulizer solution Take 3 mLs (2.5 mg total) by nebulization every 6 (six) hours as needed for wheezing or shortness of breath. 75 mL 0 PRN at PRN  . albuterol (VENTOLIN HFA) 108 (90 Base) MCG/ACT inhaler Inhale into the lungs every 6 (six) hours as needed for wheezing or shortness of breath.   PRN at PRN  . atorvastatin (LIPITOR) 20 MG tablet Take 20 mg by mouth daily.   10/07/2019 at Unknown time  . CARTIA XT 300 MG 24 hr capsule Take 300 mg by mouth daily.   10/07/2019 at Unknown time  . fexofenadine (ALLEGRA) 180 MG tablet Take 180 mg by mouth daily.   PRN at PRN  . Fluticasone-Umeclidin-Vilant (TRELEGY ELLIPTA) 100-62.5-25 MCG/INH AEPB Inhale 1 puff into the lungs daily.    10/07/2019 at Unknown time  . furosemide (LASIX) 20 MG tablet TAKE 1 TABLET BY MOUTH DAILY 30 tablet 5 10/07/2019 at Unknown time  .  metoprolol tartrate (LOPRESSOR) 50 MG tablet Take 50 mg by mouth 2 (two) times daily.   10/07/2019 at Unknown time  . mometasone (NASONEX) 50 MCG/ACT nasal spray Place 1 spray into the nose daily. Each nostril   Past Week at PRN  . potassium chloride (KLOR-CON) 10 MEQ tablet Take 10 mEq by mouth daily.   10/07/2019 at Unknown time  . predniSONE (DELTASONE) 10 MG tablet Take 5 mg by mouth daily with breakfast.   10/07/2019 at Unknown time  . traZODone (DESYREL) 50 MG tablet Take 50 mg by mouth at bedtime as needed for sleep.    Past Month at PRN  . warfarin (COUMADIN) 2 MG tablet Take 2 mg by mouth daily.    10/07/2019 at 1800  . warfarin (COUMADIN) 1 MG tablet Take 1 mg by mouth daily.   10/04/2019    Assessment: 86 year old male presenting with cough, chest pain, and shortness of breath. Pt admitted for acute on chronic respiratory failure. CT showing possible aspiration pneumonia. PMH includes atrial fibrillation, CHF, COPD, CAD, and HTN. Pharmacy has been consulted to restart home warfarin. Pt was taking 2 mg daily prior to admission.   Goal of Therapy:  INR 2-3 Monitor platelets by anticoagulation  protocol: Yes   Date INR Dose  9/10 2.4 2 mg  9/11 2.5 2 mg                   Plan:  Warfarin 2 mg  Monitor for drug interactions with warfarin Re-check INR and CBC daily with AM labs  Lu Duffel, PharmD, BCPS Clinical Pharmacist 10/09/2019 10:49 AM

## 2019-10-09 NOTE — Evaluation (Signed)
Physical Therapy Evaluation Patient Details Name: [name] MRN: [id number] DOB: 09/30/1928 Today's Date: 10/09/2019   History of Present Illness  [name] is an 86 y/o male who was admitted for acute on chronic respiratory failure with hypoxia. Pt arrived via EMS with complaints of progressive SOB and dry cough. PMH includes HTN, HLD, COPD, on 2L O2 at baseline at home, CHF, CAD, A-Fib on coumadin, and Covid-19 infection.  Clinical Impression  Pt lying in bed upon arrival to room and agreeable to participate in PT evaluation. Pt performed bed mobility with mod I for increased time and heavy UE use of bedrails to come into sitting. Pt stood to RW with CGA for steadying and boosting as pt able to initiate however required some assistance to come to full upright stance. Pt ambulated 90 feet using RW with CGA for steadying. Pt then attempted ambulation without AD x 10 feet and required min A for balance with pt reaching for objects to steady. Pt on 3L O2 in room and denies reports of SOB of difficulty breathing throughout session. Pt performed therex with verbal cues with good carryover. Pt presents with deficits in strength, balance, functional mobility, and endurance. Pt would benefit from skilled PT during acute stay to address aforementioned deficits. Recommend HHPT at discharge to optimize return to PLOF and maximize functional mobility.       Follow Up Recommendations Home health PT    Equipment Recommendations  None recommended by PT;Other (comment) (pt has equipment already)    Recommendations for Other Services       Precautions / Restrictions Precautions Precautions: Fall Restrictions Weight Bearing Restrictions: No      Mobility  Bed Mobility Overal bed mobility: Modified Independent             General bed mobility comments: Required increased time and heavy UE use on bedrail to come into sitting edge of bed  Transfers Overall transfer level: Needs  assistance Equipment used: Rolling walker (2 wheeled) Transfers: Sit to/from Stand Sit to Stand: Min guard         General transfer comment: CGA for sit <> stand for steadying and slight boosting to come into full upright standing  Ambulation/Gait Ambulation/Gait assistance: Min guard Gait Distance (Feet): 100 Feet Assistive device: Rolling walker (2 wheeled) Gait Pattern/deviations: Step-through pattern Gait velocity: 10' in 11"   General Gait Details: Pt with steady gait speed throughout ambulation distance and with equal step lengths bilaterally; CGA for steadying and safety; attempted 10 feet of ambulation without AD and pt noted to reach for objects for balance and needed min A for balance  Stairs            Wheelchair Mobility    Modified Rankin (Stroke Patients Only)       Balance Overall balance assessment: Needs assistance Sitting-balance support: Feet supported Sitting balance-Leahy Scale: Good Sitting balance - Comments: pt with good static balance while reaching within BOS   Standing balance support: Bilateral upper extremity supported Standing balance-Leahy Scale: Fair Standing balance comment: BUE support on RW with good balance; without AD pt with fair balance                             Pertinent Vitals/Pain Pain Assessment: No/denies pain    Home Living Family/patient expects to be discharged to:: Private residence Living Arrangements: Alone Available Help at Discharge: Family;Available PRN/intermittently Type of Home: House Home Access: Ramped entrance  Home Layout: Multi-level;Other (Comment) (has basement that he has not utililized recently) Home Equipment: Walker - 2 wheels;Cane - single point;Grab bars - toilet Additional Comments: Pt states that his wife had a lift chair that he may use from time to time.    Prior Function Level of Independence: Independent with assistive device(s)         Comments: Pt reports  ambulating with use of SPC for most of the time and if not, he keeps it close for usage if needed. Pt denies 6 month fall history. States that he utilizes his UE and SPC when standing from a low chair.     Hand Dominance        Extremity/Trunk Assessment   Upper Extremity Assessment Upper Extremity Assessment: Generalized weakness (grossly 3+ to 4-/5 bilaterally)    Lower Extremity Assessment Lower Extremity Assessment: Generalized weakness (grossly 3+ to 4-/5 bilaterally)       Communication   Communication: No difficulties  Cognition Arousal/Alertness: Awake/alert Behavior During Therapy: WFL for tasks assessed/performed Overall Cognitive Status: Within Functional Limits for tasks assessed                                 General Comments: Pt A&O x 4.      General Comments      Exercises Other Exercises Other Exercises: semi-supine: heel slides, hip ab/add, and APs x 10 bilat.; in sitting: alternating marches x 10 bilat.   Assessment/Plan    PT Assessment Patient needs continued PT services  PT Problem List Decreased strength;Decreased activity tolerance;Decreased balance;Decreased mobility;Cardiopulmonary status limiting activity       PT Treatment Interventions DME instruction;Gait training;Functional mobility training;Therapeutic activities;Therapeutic exercise;Balance training;Patient/family education    PT Goals (Current goals can be found in the Care Plan section)  Acute Rehab PT Goals Patient Stated Goal: to go home PT Goal Formulation: With patient Time For Goal Achievement: 10/23/19 Potential to Achieve Goals: Good    Frequency Min 2X/week   Barriers to discharge        Co-evaluation               AM-PAC PT "6 Clicks" Mobility  Outcome Measure Help needed turning from your back to your side while in a flat bed without using bedrails?: None Help needed moving from lying on your back to sitting on the side of a flat bed without  using bedrails?: A Little Help needed moving to and from a bed to a chair (including a wheelchair)?: A Little Help needed standing up from a chair using your arms (e.g., wheelchair or bedside chair)?: A Little Help needed to walk in hospital room?: A Little Help needed climbing 3-5 steps with a railing? : A Little 6 Click Score: 19    End of Session Equipment Utilized During Treatment: Gait belt;Oxygen;Other (comment) (3L O2 via nasal cannula) Activity Tolerance: Patient limited by fatigue;Patient tolerated treatment well Patient left: in chair;with call bell/phone within reach;with chair alarm set Nurse Communication: Mobility status PT Visit Diagnosis: Unsteadiness on feet (R26.81);Muscle weakness (generalized) (M62.81)    Time: 2979-8921 PT Time Calculation (min) (ACUTE ONLY): 42 min   Charges:             Vale Haven, SPT  Vale Haven 10/09/2019, 12:46 PM

## 2019-10-09 NOTE — Progress Notes (Signed)
Mobility Specialist - Progress Note   10/09/19 1459  Mobility  Activity Refused mobility  Mobility performed by Mobility specialist    Pt refused mobility session this afternoon. Pt states he is feeling "worked out" at the moment. Will re-attempt session at a later date/time when appropriate.    Mansour Balboa Mobility Specialist  10/09/19, 3:00 PM

## 2019-10-09 NOTE — Evaluation (Signed)
Occupational Therapy Evaluation Patient Details Name: [name] MRN: [id number] DOB: Jun 04, 1928 Today's Date: 10/09/2019    History of Present Illness [name] is an 86 y/o male who was admitted for acute on chronic respiratory failure with hypoxia. Pt arrived via EMS with complaints of progressive SOB and dry cough. PMH includes HTN, HLD, COPD, on 2L O2 at baseline at home, CHF, CAD, A-Fib on coumadin, and Covid-19 infection.   Clinical Impression   [name] was seen for OT evaluation this date. Prior to hospital admission, pt was MOD I for mobility and required assist c bathing. Pt lives alone c daughter available PRN. Pt presents to acute OT demonstrating impaired ADL performance and functional mobility 2/2 decreased activity tolerance and functional strength/balance deficts. Pt currently requires SBA standing grooming tasks reaching inside BOS. MOD I seated ADLs. SBA for ADL t/f. Pt would benefit from skilled OT to address noted impairments and functional limitations (see below for any additional details) in order to maximize safety and independence while minimizing falls risk and caregiver burden. Upon hospital discharge, recommend HHOT to maximize pt safety and return to functional independence during meaningful occupations of daily life.     Follow Up Recommendations  Home health OT    Equipment Recommendations  None recommended by OT    Recommendations for Other Services       Precautions / Restrictions Precautions Precautions: Fall Restrictions Weight Bearing Restrictions: No      Mobility Bed Mobility    General bed mobility comments: Pt received and left seated EOB   Transfers Overall transfer level: Needs assistance Equipment used: None Transfers: Sit to/from Stand Sit to Stand: Min guard         General transfer comment: SBA sit<>stand at EOB     Balance Overall balance assessment: Needs assistance Sitting-balance support: Feet  supported Sitting balance-[name] Scale: Good   Standing balance support: No upper extremity supported;During functional activity Standing balance-[name] Scale: Fair Standing balance comment: Fair balance reaching inside BOS          ADL either performed or assessed with clinical judgement   ADL Overall ADL's : Needs assistance/impaired    General ADL Comments: SBA standing grooming tasks reaching inside BOS. MOD I seated ADLs. SBA for ADL t/f                   Pertinent Vitals/Pain Pain Assessment: No/denies pain     Hand Dominance     Extremity/Trunk Assessment Upper Extremity Assessment Upper Extremity Assessment: Generalized weakness   Lower Extremity Assessment Lower Extremity Assessment: Generalized weakness       Communication Communication Communication: No difficulties   Cognition Arousal/Alertness: Awake/alert Behavior During Therapy: WFL for tasks assessed/performed Overall Cognitive Status: Within Functional Limits for tasks assessed     General Comments: Pt A&O x 4.   General Comments       Exercises Exercises: Other exercises Other Exercises Other Exercises: Pt educated re: OT role, d/c recs, falls prvention, ECS Other Exercises: Sit<>stand, sitting/standing balance/tolerance, functional reach   Shoulder Instructions      Home Living Family/patient expects to be discharged to:: Private residence Living Arrangements: Alone Available Help at Discharge: Family;Available PRN/intermittently Type of Home: House Home Access: Ramped entrance     Home Layout: Multi-level;Other (Comment) (has basement that he has not utililized recently)     Bathroom Shower/Tub: Occupational psychologist: Handicapped height     Home Equipment: Environmental consultant - 2 wheels;Cane - single point;Grab  bars - toilet   Additional Comments: Lift chair.       Prior Functioning/Environment Level of Independence: Independent with assistive device(s)         Comments: Pt reports ambulating with use of SPC for most of the time. DTR calls him 2x/day and can work from his house a few hours per day. Reports a friend sponge bathes him 1x/week        OT Problem List: Decreased strength;Decreased activity tolerance;Impaired balance (sitting and/or standing)      OT Treatment/Interventions: Self-care/ADL training;Therapeutic exercise;Energy conservation;DME and/or AE instruction;Therapeutic activities;Patient/family education;Balance training    OT Goals(Current goals can be found in the care plan section) Acute Rehab OT Goals Patient Stated Goal: to go home OT Goal Formulation: With patient Time For Goal Achievement: 10/23/19 Potential to Achieve Goals: Good ADL Goals Pt Will Perform Grooming: with modified independence;standing (c LRAD PRN >10 mins) Pt Will Perform Lower Body Dressing: with modified independence;sit to/from stand (c LRAD PRN) Pt Will Transfer to Toilet: with modified independence;ambulating;regular height toilet (c LRAD PRN)  OT Frequency: Min 1X/week   Barriers to D/C: Decreased caregiver support             AM-PAC OT "6 Clicks" Daily Activity     Outcome Measure Help from another person eating meals?: None Help from another person taking care of personal grooming?: A Little Help from another person toileting, which includes using toliet, bedpan, or urinal?: A Little Help from another person bathing (including washing, rinsing, drying)?: A Little Help from another person to put on and taking off regular upper body clothing?: None Help from another person to put on and taking off regular lower body clothing?: A Little 6 Click Score: 20   End of Session Equipment Utilized During Treatment: Oxygen  Activity Tolerance: Patient tolerated treatment well Patient left: in bed;with call bell/phone within reach  OT Visit Diagnosis: Other abnormalities of gait and mobility (R26.89)                Time: 1001-1010 OT Time  Calculation (min): 9 min Charges:  OT General Charges $OT Visit: 1 Visit OT Evaluation $OT Eval Low Complexity: 1 Low  Dessie Coma, M.S. OTR/L  10/09/19, 1:28 PM  ascom [phone]

## 2019-10-09 NOTE — Progress Notes (Signed)
PROGRESS NOTE    [name]   KYH:[id number]  DOB: 1928-02-09  PCP: Baxter Hire, MD    DOA: 10/08/2019 LOS: 1   Brief Narrative   [name] is a 86 y.o. male with medical history of hypertension, hyperlipidemia, COPD, on 2 L/min oxygen at home, CHF, CAD, atrial fibrillation on Coumadin, COVID-19 infection, who presented to ED on 10/08/19 with progressive shortness of breath, wet sounding cough but without sputum production, mid-anterior chest wall pain, and O2 desats to low 80's on his usual 2 L/min.    No fever or chills.  Onset of his pain occurred about a week ago, acutely when he got up from seated to pull a chair towards himself to put his legs up, as he always does.  He reported feeling "something pulling from my tummy up to my chest".  Evaluation in the ED - BNP elevated 1004, troponin and lactate normal, leukocytosis WBC 12.8k, negative Covid-19 PCR.  BP uncontrolled 202/66 >> 176/84, afebrile, HR 99, tachypneic RR 28.   Imaging -  CXR Increasing rounded density in the left apex when compared with the prior exam, possibly evolving infiltrate/mass.  CT Chest -  1. ...partially calcified biapical pleuroparenchymal scarring, which is not significantly changed compared to prior examination dated 2017 and accounts for radiographic abnormality. No evidence of mass. 2. Bronchial plugging in the dependent left lower lobe. Minimal dependent bibasilar atelectasis and/or consolidation and trace pleural effusions. Findings suggest aspiration. 3. There is a buckled fracture deformity of the mid sternal body with overlying soft tissue edema, new compared to prior examination.  Correlate for recent injury an acute point tenderness.  4. There are multiple vertebral body wedge and endplate deformities, several which are new compared to prior examination dated 2017, particularly a high-grade wedge deformity of the T7 vertebral body, a more subtle superior endplate deformity of  the T5 vertebral body, and an increased, now high-grade wedge deformity of the L2 vertebral body. These are of uncertain acuity. Correlate for acute point tenderness. MRI may be used to assess for marrow edema and fracture acuity if desired.  5. Moderate to severe emphysema. 6. Coronary artery disease. 7. Aortic Atherosclerosis (ICD10-I70.0). Aortic valve calcifications. Enlargement of the proximal descending thoracic aorta, measuring up to 3.6 x 3.3 cm, slightly enlarged compared to prior examination at which time it measured 3.2 x 3.2 cm. Recommend annual imaging followup by CTA or MRA if clinically appropriate.      Assessment & Plan   Principal Problem:   Acute on chronic respiratory failure with hypoxia (HCC) Active Problems:   Hypertension   Coronary artery disease   HLD (hyperlipidemia)   Atrial fibrillation, chronic (HCC)   Acute on chronic diastolic CHF (congestive heart failure) (HCC)   COPD exacerbation (HCC)   Aspiration pneumonia (HCC)   Sepsis (HCC)   Acute on chronic diastolic (congestive) heart failure (HCC)   Acute on chronic respiratory failure with hypoxia - POA with progressive shortness of breath and increased oxygen requirement of 3-4 l/min from baseline 2 l/min.   Multifactorial likely aspiration pneumonia/pneumonitis, and some elements of both dCHF (edema, positive JVD, BNP 1004) and mild COPD exacerbation with wheezing. --supplemental O2, maintain O2 sat > 90% --further mgmt as below  Severe sepsis secondary to Aspiration pneumonia - sepsis present on admission as evidenced by tachycardia, tachypnea, leukocytosis, worsened hypoxia reflective of organ dysfunction.  Lactic acid normal.   --Continue unasyn --Follow blood & sputum cultures --Prelim blood culture growing staph  spp.  Will await methicillin resistance result as patient clinically improving with current management.  Start Vancomycin if MRSA positive or clinical deterioration.  Acute on chronic  diastolic CHF -  Echo in 75/6433 showed EF of 29-51%, grade 1 diastolic dysfunction, mild LVH.  Presented with worsened hypoxia, elevated BNP, JVD, edema, pleural effusions, shortness of breath, all consistent with CHF.   Net IO Since Admission: 43.76 mL [10/09/19 2055] --Hold further Lasix for now, Cr rising --strict I/O's, daily weights --monitor renal function and electrolytes --Low sodium diet, fluid restriction  Acute Kidney Injury - on day 2 Cr bumped 1.04>>1.37, likely due to diuresis.     COPD with mild acute exacerbation - presented with wheezing, worsened hypoxia, improved with bronchodilators, all clinically consistent with COPD. --Scheduled Duonebs q6h, PRN albuterol nebs --Schedule Mucinex --antibiotics as above --Continue Solu-medrol at 40 mg BID, likely reduce tomorrow   Sternal body fracture - as shown by CT-chest on admission.  Given history provided of acute onset pain in chest/abdomen, tenderness on palpation, and several compression fractures, suspect fragility fracture. --Tylenol, Percocet PRN --Lidocaine patch  Multiple Thoracic and Lumbar Compression Fractures - see imaging reports for details.  Involving T5, T7 (high grade), L2 (high grade).  Patient denies back pain and has no palpable midline tenderness along the entirety of his back.   MRI of T and L spine - T5 and T7 are acute to subacute, T3 and T4 subacute to chronic and more mild, L1 through L5 are chronic. --check vitamin D, calcium, PTH --likely fragility fractures, which would be diagnostic of osteoporosis --daughter did report pt had a bad fall directly onto his mid back, striking his back on a goose statue, several months ago earlier this year.    Hypertension - chonric, uncontrolled on admission.  Continue Cardia, metoprolol, metoprolol, recently started on low dose lisinopril for CHF.    Coronary artery disease Hyperlipidemia --Continue Lipitor   Atrial fibrillation, chronic - currently  rate controlled.  Continue metoprolol.  Warfarin per pharmacy.  Monitor INR.      Patient BMI: Body mass index is 19.77 kg/m.   DVT prophylaxis: on warfarin   Diet:  Diet Orders (From admission, onward)    Start     Ordered   10/08/19 1529  Diet Heart Room service appropriate? Yes; Fluid consistency: Thin; Fluid restriction: 1500 mL Fluid  Diet effective now       Question Answer Comment  Room service appropriate? Yes   Fluid consistency: Thin   Fluid restriction: 1500 mL Fluid      10/08/19 1530            Code Status: DNR    Subjective 10/09/19    Patient seen up in chair after working with therapy.  Reports feeling somewhat better.  His main complaint is the pain right in the middle of his chest.  He describes pulling his chair he always uses to put his feet up, and sudden pain.  Chair not very heavy, he does this all the time.  No other injury or trauma.  Daughter on the phone provided history that patient had a very hard fall onto his back earlier this year several months ago, but struck his mid/upper back hard on a goose figure/statue that was behind him.  No other recent trauma or injuries.  He denies any back pain at home or here.     Disposition Plan & Communication   Status is: Inpatient  Inpatient status remains appropriate because of  severity of illness, requiring IV therapies and further evaluation as above.  Dispo: The patient is from: Home              Anticipated d/c is to: Home with [facility]              Anticipated d/c date is: 2 days              Patient currently is not medically stable for d/c.     Family Communication: spoke with daughter by phone later this afternoon   Consults, Procedures, Significant Events   Consultants:   None  Procedures:   None  Antimicrobials:   Unasyn    Objective   Vitals:   10/09/19 1147 10/09/19 1337 10/09/19 1620 10/09/19 1952  BP: 104/67  (!) 109/59 (!) 99/55  Pulse: 83  63 87  Resp:   19 19  Temp:  97.8 F (36.6 C)  97.8 F (36.6 C) 98 F (36.7 C)  TempSrc: Oral  Oral Oral  SpO2: 97% 94% 100% 99%  Weight:      Height:        Intake/Output Summary (Last 24 hours) at 10/09/2019 2038 Last data filed at 10/09/2019 1854 Gross per 24 hour  Intake 1320 ml  Output 1375 ml  Net -55 ml   Filed Weights   10/08/19 1150 10/08/19 1657 10/09/19 0454  Weight: 64.4 kg 65.6 kg 62.5 kg    Physical Exam:  General exam: awake, alert, no acute distress, frail, pleasant Respiratory system: CTAB with dimished bases left>right, no wheezes, normal respiratory effort. Cardiovascular system: normal S1/S2, RRR, trace pedal edema.   Gastrointestinal system: soft, NT, ND, no HSM felt, +bowel sounds. Central nervous system: A&O x3. no gross focal neurologic deficits, normal speech Extremities / Musculoskeletal: moves all extremities, no cyanosis, normal tone, anterior chest wall tenderness on palpation over mid/lower sternum Psychiatry: normal mood, congruent affect, judgement and insight appear normal  Labs   Data Reviewed: I have personally reviewed following labs and imaging studies  CBC: Recent Labs  Lab 10/08/19 1150 10/09/19 0358  WBC 12.8* 13.0*  NEUTROABS 8.6*  --   HGB 12.0* 11.6*  HCT 35.0* 34.7*  MCV 86.0 88.5  PLT 209 417   Basic Metabolic Panel: Recent Labs  Lab 10/08/19 1150 10/09/19 0358  NA 135 134*  K 4.4 4.6  CL 99 98  CO2 28 26  GLUCOSE 108* 152*  BUN 31* 33*  CREATININE 1.04 1.37*  CALCIUM 9.1 8.8*  MG  --  2.2   GFR: Estimated Creatinine Clearance: 32.3 mL/min (A) (by C-G formula based on SCr of 1.37 mg/dL (H)). Liver Function Tests: Recent Labs  Lab 10/08/19 1150  AST 19  ALT 14  ALKPHOS 66  BILITOT 1.1  PROT 7.6  ALBUMIN 4.0   No results for input(s): LIPASE, AMYLASE in the last 168 hours. No results for input(s): AMMONIA in the last 168 hours. Coagulation Profile: Recent Labs  Lab 10/08/19 1831 10/09/19 0358  INR 2.4* 2.5*   Cardiac  Enzymes: No results for input(s): CKTOTAL, CKMB, CKMBINDEX, TROPONINI in the last 168 hours. BNP (last 3 results) No results for input(s): PROBNP in the last 8760 hours. HbA1C: No results for input(s): HGBA1C in the last 72 hours. CBG: No results for input(s): GLUCAP in the last 168 hours. Lipid Profile: No results for input(s): CHOL, HDL, LDLCALC, TRIG, CHOLHDL, LDLDIRECT in the last 72 hours. Thyroid Function Tests: No results for input(s): TSH, T4TOTAL, FREET4, T3FREE, THYROIDAB  in the last 72 hours. Anemia Panel: No results for input(s): VITAMINB12, FOLATE, FERRITIN, TIBC, IRON, RETICCTPCT in the last 72 hours. Sepsis Labs: Recent Labs  Lab 10/08/19 1150 10/08/19 1416  PROCALCITON  --  <0.10  LATICACIDVEN 1.1 0.9    Recent Results (from the past 240 hour(s))  SARS Coronavirus 2 by RT PCR (hospital order, performed in [facility] hospital lab) Nasopharyngeal Nasopharyngeal Swab     Status: None   Collection Time: 10/08/19 11:50 AM   Specimen: Nasopharyngeal Swab  Result Value Ref Range Status   SARS Coronavirus 2 NEGATIVE NEGATIVE Final    Comment: (NOTE) SARS-CoV-2 target nucleic acids are NOT DETECTED.  The SARS-CoV-2 RNA is generally detectable in upper and lower respiratory specimens during the acute phase of infection. The lowest concentration of SARS-CoV-2 viral copies this assay can detect is 250 copies / mL. A negative result does not preclude SARS-CoV-2 infection and should not be used as the sole basis for treatment or other patient management decisions.  A negative result may occur with improper specimen collection / handling, submission of specimen other than nasopharyngeal swab, presence of viral mutation(s) within the areas targeted by this assay, and inadequate number of viral copies (<250 copies / mL). A negative result must be combined with clinical observations, patient history, and epidemiological information.  Fact Sheet for Patients:    StrictlyIdeas.no  Fact Sheet for Healthcare Providers: BankingDealers.co.za  This test is not yet approved or  cleared by the [place] FDA and has been authorized for detection and/or diagnosis of SARS-CoV-2 by FDA under an Emergency Use Authorization (EUA).  This EUA will remain in effect (meaning this test can be used) for the duration of the COVID-19 declaration under Section 564(b)(1) of the Act, 21 U.S.C. section 360bbb-3(b)(1), unless the authorization is terminated or revoked sooner.  Performed at [facility], [address] [place], [place] [address]   CULTURE, BLOOD (ROUTINE X 2) w Reflex to ID Panel     Status: None (Preliminary result)   Collection Time: 10/08/19  3:45 PM   Specimen: BLOOD  Result Value Ref Range Status   Specimen Description BLOOD RIGHT ANTECUBITAL  Final   Special Requests   Final    BOTTLES DRAWN AEROBIC AND ANAEROBIC Blood Culture adequate volume   Culture  Setup Time   Final    Organism ID to follow GRAM POSITIVE COCCI IN BOTH AEROBIC AND ANAEROBIC BOTTLES CRITICAL RESULT CALLED TO, READ BACK BY AND VERIFIED WITH: Dorena Bodo AT 7106 10/09/19 [place] Performed at [facility], [address], [place], [place] [address]    Culture GRAM POSITIVE COCCI  Final   Report Status PENDING  Incomplete  CULTURE, BLOOD (ROUTINE X 2) w Reflex to ID Panel     Status: None (Preliminary result)   Collection Time: 10/08/19  3:45 PM   Specimen: BLOOD  Result Value Ref Range Status   Specimen Description BLOOD LEFT ANTECUBITAL  Final   Special Requests   Final    BOTTLES DRAWN AEROBIC AND ANAEROBIC Blood Culture adequate volume   Culture  Setup Time   Final    GRAM POSITIVE COCCI IN BOTH AEROBIC AND ANAEROBIC BOTTLES CRITICAL VALUE NOTED.  VALUE IS CONSISTENT WITH PREVIOUSLY REPORTED AND CALLED VALUE. Performed at [facility], [address] [place], [place] [address]     Culture [facility] POSITIVE COCCI  Final   Report Status PENDING  Incomplete  Blood Culture ID Panel (Reflexed)  Status: Abnormal   Collection Time: 10/08/19  3:45 PM  Result Value Ref Range Status   Enterococcus faecalis NOT DETECTED NOT DETECTED Final   Enterococcus Faecium NOT DETECTED NOT DETECTED Final   Listeria monocytogenes NOT DETECTED NOT DETECTED Final   Staphylococcus species DETECTED (A) NOT DETECTED Final    Comment: CRITICAL RESULT CALLED TO, READ BACK BY AND VERIFIED WITH:  ABBY ELLINGTON AT 0907 10/09/19 SDR    Staphylococcus aureus (BCID) NOT DETECTED NOT DETECTED Final   Staphylococcus epidermidis NOT DETECTED NOT DETECTED Final   Staphylococcus lugdunensis NOT DETECTED NOT DETECTED Final   Streptococcus species NOT DETECTED NOT DETECTED Final   Streptococcus agalactiae NOT DETECTED NOT DETECTED Final   Streptococcus pneumoniae NOT DETECTED NOT DETECTED Final   Streptococcus pyogenes NOT DETECTED NOT DETECTED Final   A.calcoaceticus-baumannii NOT DETECTED NOT DETECTED Final   Bacteroides fragilis NOT DETECTED NOT DETECTED Final   Enterobacterales NOT DETECTED NOT DETECTED Final   Enterobacter cloacae complex NOT DETECTED NOT DETECTED Final   Escherichia coli NOT DETECTED NOT DETECTED Final   Klebsiella aerogenes NOT DETECTED NOT DETECTED Final   Klebsiella oxytoca NOT DETECTED NOT DETECTED Final   Klebsiella pneumoniae NOT DETECTED NOT DETECTED Final   Proteus species NOT DETECTED NOT DETECTED Final   Salmonella species NOT DETECTED NOT DETECTED Final   Serratia marcescens NOT DETECTED NOT DETECTED Final   Haemophilus influenzae NOT DETECTED NOT DETECTED Final   Neisseria meningitidis NOT DETECTED NOT DETECTED Final   Pseudomonas aeruginosa NOT DETECTED NOT DETECTED Final   Stenotrophomonas maltophilia NOT DETECTED NOT DETECTED Final   Candida albicans NOT DETECTED NOT DETECTED Final   Candida auris NOT DETECTED NOT DETECTED Final   Candida glabrata NOT DETECTED NOT  DETECTED Final   Candida krusei NOT DETECTED NOT DETECTED Final   Candida parapsilosis NOT DETECTED NOT DETECTED Final   Candida tropicalis NOT DETECTED NOT DETECTED Final   Cryptococcus neoformans/gattii NOT DETECTED NOT DETECTED Final    Comment: Performed at [facility], [address] [place], [place] [address]      Imaging Studies   CT Chest Wo Contrast  Result Date: 10/08/2019 CLINICAL DATA:  Abnormal chest radiograph, possible left upper lobe mass EXAM: CT CHEST WITHOUT CONTRAST TECHNIQUE: Multidetector CT imaging of the chest was performed following the standard protocol without IV contrast. COMPARISON:  Same day chest radiograph, CT chest, 11/29/2015 FINDINGS: Cardiovascular: Aortic atherosclerosis. Aortic valve calcifications. Enlargement of the proximal descending thoracic aorta, measuring up to 3.6 x 3.3 cm, slightly enlarged compared to prior examination at which time it measured 3.2 x 3.2 cm. Normal heart size. Extensive 3 vessel coronary artery calcifications and/or stents. No pericardial effusion. Mediastinum/Nodes: No enlarged mediastinal, hilar, or axillary lymph nodes. Thyroid gland, trachea, and esophagus demonstrate no significant findings. Lungs/Pleura: Moderate to severe centrilobular and paraseptal emphysema. There is redemonstrated partially calcified biapical pleuroparenchymal scarring, which is not significantly changed compared to prior examination dated 11/29/2015. There is bronchial plugging in the dependent left lower lobe (series 3, image 78). There is minimal dependent bibasilar atelectasis and/or consolidation and trace pleural effusions. Upper Abdomen: No acute abnormality. Musculoskeletal: Osteopenia. There is a buckled fracture deformity of the mid sternal body with overlying soft tissue edema (series 6, image 88). There are multiple vertebral body wedge and endplate deformities, several which are new compared to prior examination dated 2017, particularly  a high-grade wedge deformity of the T7 vertebral body, a more subtle superior endplate deformity of the T5 vertebral body, and  an increased, now high-grade wedge deformity of the L2 vertebral body. IMPRESSION: 1. There is redemonstrated partially calcified biapical pleuroparenchymal scarring, which is not significantly changed compared to prior examination dated 2017 and accounts for radiographic abnormality. No evidence of mass. 2. Bronchial plugging in the dependent left lower lobe. Minimal dependent bibasilar atelectasis and/or consolidation and trace pleural effusions. Findings suggest aspiration. 3. There is a buckled fracture deformity of the mid sternal body with overlying soft tissue edema, new compared to prior examination. Correlate for recent injury an acute point tenderness. 4. There are multiple vertebral body wedge and endplate deformities, several which are new compared to prior examination dated 2017, particularly a high-grade wedge deformity of the T7 vertebral body, a more subtle superior endplate deformity of the T5 vertebral body, and an increased, now high-grade wedge deformity of the L2 vertebral body. These are of uncertain acuity. Correlate for acute point tenderness. MRI may be used to assess for marrow edema and fracture acuity if desired. 5. Moderate to severe emphysema. Emphysema (ICD10-J43.9). 6. Coronary artery disease. 7. Aortic Atherosclerosis (ICD10-I70.0). Aortic valve calcifications. Enlargement of the proximal descending thoracic aorta, measuring up to 3.6 x 3.3 cm, slightly enlarged compared to prior examination at which time it measured 3.2 x 3.2 cm. Recommend annual imaging followup by CTA or MRA if clinically appropriate. This recommendation follows 2010 ACCF/AHA/AATS/ACR/ASA/SCA/SCAI/SIR/STS/SVM Guidelines for the Diagnosis and Management of Patients with Thoracic Aortic Disease. Circulation.2010; 121: I696-E952. Aortic aneurysm NOS (ICD10-I71.9) Electronically Signed   By:  Eddie Candle M.D.   On: 10/08/2019 13:20   MR THORACIC SPINE WO CONTRAST  Result Date: 10/09/2019 CLINICAL DATA:  Initial evaluation for compression fractures. EXAM: MRI THORACIC AND LUMBAR SPINE WITHOUT CONTRAST TECHNIQUE: Multiplanar and multiecho pulse sequences of the thoracic and lumbar spine were obtained without intravenous contrast. COMPARISON:  Prior CT from 10/08/2019. FINDINGS: MRI THORACIC SPINE FINDINGS Alignment: Exaggeration of the normal thoracic kyphosis with focal kyphotic angulation about a T7 compression fracture. Underlying trace scoliosis. No listhesis. Vertebrae: Compression deformities about the superior endplates of T3 and T4 with mild 20% height loss without significant bony retropulsion. Trace residual marrow edema seen about these fractures, suggesting that these are late subacute to chronic in nature. Additional compression fracture of T5 with up to 35% height loss without significant bony retropulsion. Residual linear marrow edema consistent with an acute to subacute fracture (series 21, image 12). Severe compression deformity with near complete anterior height loss of T7 with associated 3 mm bony retropulsion also demonstrates some residual marrow edema, suggesting an acute to subacute component. Otherwise, vertebral body height fairly well maintained, with no other acute or chronic compression fracture. Underlying bone marrow signal intensity within normal limits. No worrisome osseous lesions. No other abnormal marrow edema. Cord:  Normal signal and morphology. Paraspinal and other soft tissues: Paraspinous soft tissues demonstrate no acute finding. Layering bilateral pleural effusions. Irregular emphysematous changes partially visualize, better evaluated on prior chest CT. Few scattered simple cysts noted about the visualized kidneys. Aortic atherosclerosis noted. Disc levels: T7-8: 3 mm bony retropulsion related to the T7 compression fracture. Secondary flattening of the ventral  thecal sac with mild flattening of the ventral spinal cord, but no more than mild spinal stenosis. Otherwise, normal expected for age degenerative disc disease elsewhere within the thoracic spine. Mild scattered multilevel facet hypertrophy. No other significant canal or foraminal stenosis. MRI LUMBAR SPINE FINDINGS Segmentation:  Standard. Alignment: Trace anterolisthesis of L1 on L2, with trace retrolisthesis of L2 on L3. Trace  anterolisthesis of L4 on L5. Findings chronic and facet mediated. Vertebrae: Compression fracture of L1 with up to 60% height loss and 3 mm bony retropulsion. Compression fracture of L2 with severe 80% height loss and 3 mm bony retropulsion. Biconcave compression fracture of L3 with mild 25% central height loss without bony retropulsion. Compression fracture of the superior endplate of L4 with mild 35% height loss without bony retropulsion. Compression fracture of the superior endplate of L5 with associated 35% height loss with trace 2 mm bony retropulsion. These fractures are chronic in appearance, with no residual marrow edema. Underlying bone marrow signal intensity within normal limits. No worrisome osseous lesions. Conus medullaris and cauda equina: Conus extends to the L1 level. Conus and cauda equina appear normal. Paraspinal and other soft tissues: Paraspinous soft tissues demonstrate no acute finding. Few scattered cysts noted about the kidneys bilaterally. Aortic atherosclerosis. Disc levels: L1-2: Diffuse disc bulge with disc desiccation, with superimposed 3 mm bony retropulsion related to the L2 compression fracture. Mild facet hypertrophy. No significant spinal stenosis. Mild bilateral L1 foraminal narrowing. No impingement. L2-3: Mild diffuse disc bulge with disc desiccation. Mild facet hypertrophy. No significant spinal stenosis. Mild right L2 foraminal narrowing. No significant left foraminal encroachment. No impingement. L3-4: Mild diffuse disc bulge with disc desiccation  and intervertebral disc space narrowing. Mild facet hypertrophy. No significant spinal stenosis. Foramina remain patent. L4-5: Mild diffuse disc bulge with disc desiccation and intervertebral disc space narrowing. Superimposed broad-based central to left foraminal disc protrusion flattens the left ventral thecal sac (series 4, image 25). Mild facet hypertrophy. Resultant mild to moderate left lateral recess stenosis. Central canal remains patent. Mild left L4 foraminal narrowing. No significant right foraminal stenosis. L5-S1: Disc desiccation with minimal disc bulge. Mild facet hypertrophy. No significant canal or foraminal stenosis. IMPRESSION: MR THORACIC SPINE IMPRESSION 1. Severe compression deformity of T7 with associated 3 mm bony retropulsion and mild spinal stenosis. Associated residual marrow edema suggests an acute to subacute component. 2. Acute to subacute compression fracture of T5 with up to 35% height loss without bony retropulsion. 3. Mild compression fractures of T3 and T4 with up to 20% height loss. No more than trace residual marrow edema, suggesting at these are late subacute to chronic. 4. Layering bilateral pleural effusions, increased from prior CT. MR LUMBAR SPINE IMPRESSION 1. Multiple compression fractures involving the L1 through L5 vertebral bodies as above, all of which are chronic in nature without residual marrow edema. No acute fracture within the lumbar spine. 2. Underlying multilevel lumbar spondylosis, most pronounced at L4-5 were there is resultant mild to moderate left lateral recess stenosis, descending left L5 nerve root level. No other significant stenosis or neural impingement. Electronically Signed   By: Jeannine Boga M.D.   On: 10/09/2019 03:08   MR LUMBAR SPINE WO CONTRAST  Result Date: 10/09/2019 CLINICAL DATA:  Initial evaluation for compression fractures. EXAM: MRI THORACIC AND LUMBAR SPINE WITHOUT CONTRAST TECHNIQUE: Multiplanar and multiecho pulse  sequences of the thoracic and lumbar spine were obtained without intravenous contrast. COMPARISON:  Prior CT from 10/08/2019. FINDINGS: MRI THORACIC SPINE FINDINGS Alignment: Exaggeration of the normal thoracic kyphosis with focal kyphotic angulation about a T7 compression fracture. Underlying trace scoliosis. No listhesis. Vertebrae: Compression deformities about the superior endplates of T3 and T4 with mild 20% height loss without significant bony retropulsion. Trace residual marrow edema seen about these fractures, suggesting that these are late subacute to chronic in nature. Additional compression fracture of T5 with up to  35% height loss without significant bony retropulsion. Residual linear marrow edema consistent with an acute to subacute fracture (series 21, image 12). Severe compression deformity with near complete anterior height loss of T7 with associated 3 mm bony retropulsion also demonstrates some residual marrow edema, suggesting an acute to subacute component. Otherwise, vertebral body height fairly well maintained, with no other acute or chronic compression fracture. Underlying bone marrow signal intensity within normal limits. No worrisome osseous lesions. No other abnormal marrow edema. Cord:  Normal signal and morphology. Paraspinal and other soft tissues: Paraspinous soft tissues demonstrate no acute finding. Layering bilateral pleural effusions. Irregular emphysematous changes partially visualize, better evaluated on prior chest CT. Few scattered simple cysts noted about the visualized kidneys. Aortic atherosclerosis noted. Disc levels: T7-8: 3 mm bony retropulsion related to the T7 compression fracture. Secondary flattening of the ventral thecal sac with mild flattening of the ventral spinal cord, but no more than mild spinal stenosis. Otherwise, normal expected for age degenerative disc disease elsewhere within the thoracic spine. Mild scattered multilevel facet hypertrophy. No other  significant canal or foraminal stenosis. MRI LUMBAR SPINE FINDINGS Segmentation:  Standard. Alignment: Trace anterolisthesis of L1 on L2, with trace retrolisthesis of L2 on L3. Trace anterolisthesis of L4 on L5. Findings chronic and facet mediated. Vertebrae: Compression fracture of L1 with up to 60% height loss and 3 mm bony retropulsion. Compression fracture of L2 with severe 80% height loss and 3 mm bony retropulsion. Biconcave compression fracture of L3 with mild 25% central height loss without bony retropulsion. Compression fracture of the superior endplate of L4 with mild 35% height loss without bony retropulsion. Compression fracture of the superior endplate of L5 with associated 35% height loss with trace 2 mm bony retropulsion. These fractures are chronic in appearance, with no residual marrow edema. Underlying bone marrow signal intensity within normal limits. No worrisome osseous lesions. Conus medullaris and cauda equina: Conus extends to the L1 level. Conus and cauda equina appear normal. Paraspinal and other soft tissues: Paraspinous soft tissues demonstrate no acute finding. Few scattered cysts noted about the kidneys bilaterally. Aortic atherosclerosis. Disc levels: L1-2: Diffuse disc bulge with disc desiccation, with superimposed 3 mm bony retropulsion related to the L2 compression fracture. Mild facet hypertrophy. No significant spinal stenosis. Mild bilateral L1 foraminal narrowing. No impingement. L2-3: Mild diffuse disc bulge with disc desiccation. Mild facet hypertrophy. No significant spinal stenosis. Mild right L2 foraminal narrowing. No significant left foraminal encroachment. No impingement. L3-4: Mild diffuse disc bulge with disc desiccation and intervertebral disc space narrowing. Mild facet hypertrophy. No significant spinal stenosis. Foramina remain patent. L4-5: Mild diffuse disc bulge with disc desiccation and intervertebral disc space narrowing. Superimposed broad-based central to  left foraminal disc protrusion flattens the left ventral thecal sac (series 4, image 25). Mild facet hypertrophy. Resultant mild to moderate left lateral recess stenosis. Central canal remains patent. Mild left L4 foraminal narrowing. No significant right foraminal stenosis. L5-S1: Disc desiccation with minimal disc bulge. Mild facet hypertrophy. No significant canal or foraminal stenosis. IMPRESSION: MR THORACIC SPINE IMPRESSION 1. Severe compression deformity of T7 with associated 3 mm bony retropulsion and mild spinal stenosis. Associated residual marrow edema suggests an acute to subacute component. 2. Acute to subacute compression fracture of T5 with up to 35% height loss without bony retropulsion. 3. Mild compression fractures of T3 and T4 with up to 20% height loss. No more than trace residual marrow edema, suggesting at these are late subacute to chronic. 4. Layering bilateral  pleural effusions, increased from prior CT. MR LUMBAR SPINE IMPRESSION 1. Multiple compression fractures involving the L1 through L5 vertebral bodies as above, all of which are chronic in nature without residual marrow edema. No acute fracture within the lumbar spine. 2. Underlying multilevel lumbar spondylosis, most pronounced at L4-5 were there is resultant mild to moderate left lateral recess stenosis, descending left L5 nerve root level. No other significant stenosis or neural impingement. Electronically Signed   By: Jeannine Boga M.D.   On: 10/09/2019 03:08   DG Chest Portable 1 View  Result Date: 10/08/2019 CLINICAL DATA:  Increasing shortness of breath and cough with left-sided chest pain EXAM: PORTABLE CHEST 1 VIEW COMPARISON:  04/05/2019 FINDINGS: Cardiac shadow is stable. Aortic calcifications are again seen. Biapical pleuroparenchymal scarring is noted. Some increasing density is noted in the left apex likely related to increasing scarring although the possibility of evolving mass or infiltrate could not be  totally excluded. No sizable effusion is noted. No acute bony abnormality is seen. IMPRESSION: Increasing rounded density in the left apex when compared with the prior exam. The possibility of evolving infiltrate/mass could not be totally excluded. CT of the chest is recommended for further evaluation. Electronically Signed   By: Inez Catalina M.D.   On: 10/08/2019 12:17     Medications   Scheduled Meds: . atorvastatin  20 mg Oral Daily  . diltiazem  300 mg Oral Daily  . ipratropium-albuterol  3 mL Nebulization Q6H  . lisinopril  2.5 mg Oral Daily  . loratadine  10 mg Oral Daily  . mouth rinse  15 mL Mouth Rinse BID  . methylPREDNISolone (SOLU-MEDROL) injection  40 mg Intravenous Q12H  . metoprolol tartrate  50 mg Oral BID  . sodium chloride flush  3 mL Intravenous Q12H  . Warfarin - Pharmacist Dosing Inpatient   Does not apply q1600   Continuous Infusions: . sodium chloride 250 mL (10/08/19 2237)  . ampicillin-sulbactam (UNASYN) IV 3 g (10/09/19 1517)       LOS: 1 day    Time spent: 50 minutes with > 50% spent in coordinating care and direct patient contact    Ezekiel Slocumb, DO [facility]  10/09/2019, 8:38 PM    If 7PM-7AM, please contact night-coverage. How to contact the [facility] Attending or Consulting provider [place] or covering provider during after hours [place], for this patient?    1. Check the care team in [facility] and look for a) attending/consulting [facility] provider listed and b) the [facility] team listed 2. Log into [email or web address] and use [place]'s universal password to access. If you do not have the password, please contact the hospital operator. 3. Locate the [facility] provider you are looking for under [facility] and page to a number that you can be directly reached. 4. If you still have difficulty reaching the provider, please page the [facility] (Director on Call) for the Hospitalists listed on amion for assistance.

## 2019-10-09 NOTE — Plan of Care (Signed)
  Problem: Activity: ?Goal: Ability to tolerate increased activity will improve ?Outcome: Progressing ?  ?Problem: Clinical Measurements: ?Goal: Ability to maintain a body temperature in the normal range will improve ?Outcome: Progressing ?  ?Problem: Respiratory: ?Goal: Ability to maintain adequate ventilation will improve ?Outcome: Progressing ?Goal: Ability to maintain a clear airway will improve ?Outcome: Progressing ?  ?Problem: Education: ?Goal: Knowledge of General Education information will improve ?Description: Including pain rating scale, medication(s)/side effects and non-pharmacologic comfort measures ?Outcome: Progressing ?  ?Problem: Health Behavior/Discharge Planning: ?Goal: Ability to manage health-related needs will improve ?Outcome: Progressing ?  ?Problem: Clinical Measurements: ?Goal: Ability to maintain clinical measurements within normal limits will improve ?Outcome: Progressing ?Goal: Will remain free from infection ?Outcome: Progressing ?Goal: Diagnostic test results will improve ?Outcome: Progressing ?Goal: Respiratory complications will improve ?Outcome: Progressing ?Goal: Cardiovascular complication will be avoided ?Outcome: Progressing ?  ?Problem: Activity: ?Goal: Risk for activity intolerance will decrease ?Outcome: Progressing ?  ?Problem: Nutrition: ?Goal: Adequate nutrition will be maintained ?Outcome: Progressing ?  ?Problem: Coping: ?Goal: Level of anxiety will decrease ?Outcome: Progressing ?  ?Problem: Elimination: ?Goal: Will not experience complications related to bowel motility ?Outcome: Progressing ?Goal: Will not experience complications related to urinary retention ?Outcome: Progressing ?  ?Problem: Pain Managment: ?Goal: General experience of comfort will improve ?Outcome: Progressing ?  ?Problem: Safety: ?Goal: Ability to remain free from injury will improve ?Outcome: Progressing ?  ?Problem: Skin Integrity: ?Goal: Risk for impaired skin integrity will decrease ?Outcome:  Progressing ?  ?Problem: Education: ?Goal: Ability to demonstrate management of disease process will improve ?Outcome: Progressing ?Goal: Ability to verbalize understanding of medication therapies will improve ?Outcome: Progressing ?Goal: Individualized Educational Video(s) ?Outcome: Progressing ?  ?Problem: Activity: ?Goal: Capacity to carry out activities will improve ?Outcome: Progressing ?  ?Problem: Cardiac: ?Goal: Ability to achieve and maintain adequate cardiopulmonary perfusion will improve ?Outcome: Progressing ?  ?

## 2019-10-10 ENCOUNTER — Inpatient Hospital Stay
Admit: 2019-10-10 | Discharge: 2019-10-10 | Disposition: A | Discharge status: Home / Self Care | Payer: Medicare Other | Attending: Internal Medicine | Admitting: Internal Medicine

## 2019-10-10 DIAGNOSIS — J441 Chronic obstructive pulmonary disease with (acute) exacerbation: Secondary | ICD-10-CM

## 2019-10-10 LAB — ECHOCARDIOGRAM COMPLETE
AR max vel: 1.26 cm2
AV Area VTI: 1.44 cm2
AV Area mean vel: 1.25 cm2
AV Mean grad: 7 mmHg
AV Peak grad: 15.5 mmHg
Ao pk vel: 1.97 m/s
Area-P 1/2: 4.68 cm2
Height: 70 in
P 1/2 time: 540 msec
S' Lateral: 3.34 cm
Weight: 2232.82 oz

## 2019-10-10 LAB — VITAMIN D 25 HYDROXY (VIT D DEFICIENCY, FRACTURES): Vit D, 25-Hydroxy: 16.8 ng/mL — ABNORMAL LOW (ref 30–100)

## 2019-10-10 LAB — CBC
HCT: 33.4 % — ABNORMAL LOW (ref 39.0–52.0)
Hemoglobin: 10.9 g/dL — ABNORMAL LOW (ref 13.0–17.0)
MCH: 28.8 pg (ref 26.0–34.0)
MCHC: 32.6 g/dL (ref 30.0–36.0)
MCV: 88.4 fL (ref 80.0–100.0)
Platelets: 188 10*3/uL (ref 150–400)
RBC: 3.78 MIL/uL — ABNORMAL LOW (ref 4.22–5.81)
RDW: 15.1 % (ref 11.5–15.5)
WBC: 16.2 10*3/uL — ABNORMAL HIGH (ref 4.0–10.5)
nRBC: 0 % (ref 0.0–0.2)

## 2019-10-10 LAB — BASIC METABOLIC PANEL
Anion gap: 13 (ref 5–15)
BUN: 52 mg/dL — ABNORMAL HIGH (ref 8–23)
CO2: 29 mmol/L (ref 22–32)
Calcium: 8.8 mg/dL — ABNORMAL LOW (ref 8.9–10.3)
Chloride: 97 mmol/L — ABNORMAL LOW (ref 98–111)
Creatinine, Ser: 1.25 mg/dL — ABNORMAL HIGH (ref 0.61–1.24)
GFR calc Af Amer: 59 mL/min — ABNORMAL LOW (ref >60)
GFR calc non Af Amer: 51 mL/min — ABNORMAL LOW (ref >60)
Glucose, Bld: 198 mg/dL — ABNORMAL HIGH (ref 70–99)
Potassium: 3.9 mmol/L (ref 3.5–5.1)
Sodium: 139 mmol/L (ref 135–145)

## 2019-10-10 LAB — PROTIME-INR
INR: 3.5 — ABNORMAL HIGH (ref 0.8–1.2)
Prothrombin Time: 33.8 seconds — ABNORMAL HIGH (ref 11.4–15.2)

## 2019-10-10 MED ORDER — VITAMIN D (ERGOCALCIFEROL) 1.25 MG (50000 UNIT) PO CAPS
50000.0000 [IU] | ORAL_CAPSULE | ORAL | Status: DC
  Administered 2019-10-10: 50000 [IU] via ORAL
  Filled 2019-10-10: qty 1

## 2019-10-10 MED ORDER — PREDNISONE 20 MG PO TABS
40.0000 mg | ORAL_TABLET | Freq: Every day | ORAL | Status: DC
  Administered 2019-10-11 – 2019-10-12 (×2): 40 mg via ORAL
  Filled 2019-10-10 (×2): qty 2

## 2019-10-10 MED ORDER — VANCOMYCIN HCL 1250 MG/250ML IV SOLN
1250.0000 mg | Freq: Once | INTRAVENOUS | Status: AC
  Administered 2019-10-10: 1250 mg via INTRAVENOUS
  Filled 2019-10-10: qty 250

## 2019-10-10 MED ORDER — IPRATROPIUM-ALBUTEROL 0.5-2.5 (3) MG/3ML IN SOLN
3.0000 mL | Freq: Three times a day (TID) | RESPIRATORY_TRACT | Status: DC
  Administered 2019-10-10 – 2019-10-15 (×15): 3 mL via RESPIRATORY_TRACT
  Filled 2019-10-10 (×15): qty 3

## 2019-10-10 MED ORDER — VANCOMYCIN HCL IN DEXTROSE 1-5 GM/200ML-% IV SOLN
1000.0000 mg | INTRAVENOUS | Status: DC
  Administered 2019-10-11: 1000 mg via INTRAVENOUS
  Filled 2019-10-10 (×2): qty 200

## 2019-10-10 NOTE — Progress Notes (Signed)
PROGRESS NOTE    [name]   MWU:[id number]  DOB: 1928-06-10  PCP: Baxter Hire, MD    DOA: 10/08/2019 LOS: 2   Brief Narrative   [name] is a 86 y.o. male with medical history of hypertension, hyperlipidemia, COPD, on 2 L/min oxygen at home, CHF, CAD, atrial fibrillation on Coumadin, COVID-19 infection, who presented to ED on 10/08/19 with progressive shortness of breath, wet sounding cough but without sputum production, mid-anterior chest wall pain, and O2 desats to low 80's on his usual 2 L/min.    No fever or chills.  Onset of his pain occurred about a week ago, acutely when he got up from seated to pull a chair towards himself to put his legs up, as he always does.  He reported feeling "something pulling from my tummy up to my chest".  Evaluation in the ED - BNP elevated 1004, troponin and lactate normal, leukocytosis WBC 12.8k, negative Covid-19 PCR.  BP uncontrolled 202/66 >> 176/84, afebrile, HR 99, tachypneic RR 28.   Imaging -  CXR Increasing rounded density in the left apex when compared with the prior exam, possibly evolving infiltrate/mass.  CT Chest -  1. ...partially calcified biapical pleuroparenchymal scarring, which is not significantly changed compared to prior examination dated 2017 and accounts for radiographic abnormality. No evidence of mass. 2. Bronchial plugging in the dependent left lower lobe. Minimal dependent bibasilar atelectasis and/or consolidation and trace pleural effusions. Findings suggest aspiration. 3. There is a buckled fracture deformity of the mid sternal body with overlying soft tissue edema, new compared to prior examination.  Correlate for recent injury an acute point tenderness.  4. There are multiple vertebral body wedge and endplate deformities, several which are new compared to prior examination dated 2017, particularly a high-grade wedge deformity of the T7 vertebral body, a more subtle superior endplate deformity of  the T5 vertebral body, and an increased, now high-grade wedge deformity of the L2 vertebral body. These are of uncertain acuity. Correlate for acute point tenderness. MRI may be used to assess for marrow edema and fracture acuity if desired.  5. Moderate to severe emphysema. 6. Coronary artery disease. 7. Aortic Atherosclerosis (ICD10-I70.0). Aortic valve calcifications. Enlargement of the proximal descending thoracic aorta, measuring up to 3.6 x 3.3 cm, slightly enlarged compared to prior examination at which time it measured 3.2 x 3.2 cm. Recommend annual imaging followup by CTA or MRA if clinically appropriate.      Assessment & Plan   Principal Problem:   Acute on chronic respiratory failure with hypoxia (HCC) Active Problems:   Hypertension   Coronary artery disease   HLD (hyperlipidemia)   Atrial fibrillation, chronic (HCC)   Acute on chronic diastolic CHF (congestive heart failure) (HCC)   COPD exacerbation (HCC)   Aspiration pneumonia (HCC)   Sepsis (HCC)   Acute on chronic diastolic (congestive) heart failure (HCC)   Acute on chronic respiratory failure with hypoxia - POA with progressive shortness of breath and increased oxygen requirement of 3-4 l/min from baseline 2 l/min.   Multifactorial likely aspiration pneumonia/pneumonitis, and some elements of both dCHF (edema, positive JVD, BNP 1004) and mild COPD exacerbation with wheezing. --supplemental O2, maintain O2 sat > 90% --further mgmt as below  Severe sepsis secondary to Aspiration pneumonia - sepsis present on admission as evidenced by tachycardia, tachypnea, leukocytosis, worsened hypoxia reflective of organ dysfunction.  Lactic acid normal.   --Continue Unasyn --Follow blood & sputum cultures --Prelim blood culture growing staph  spp.  Will await methicillin resistance result as patient clinically improving with current management.  Follow. --Started Vancomycin today due to rising WBC   Acute on chronic  diastolic CHF / Valvular heart disease -  Presented with worsened hypoxia, elevated BNP, JVD, edema, pleural effusions, shortness of breath, all consistent with CHF.   Echo in 12/2018 showed EF of 06-30%, grade 1 diastolic dysfunction, mild LVH.   Echo on 9/12: EF 55-60%, grade 1 dd, signs of RV volume overload, moderate PASP elevation, severely dilated RA, mod-severe TR, mild MR. Net IO Since Admission: -408.24 mL [10/10/19 1657] --Hold further Lasix for now, Cr rising --strict I/O's, daily weights --monitor renal function and electrolytes --Low sodium diet, fluid restriction  Acute Kidney Injury - on day 2 Cr bumped 1.04>>1.37>>1.25, likely due to diuresis.   Hold Lasix as above.  Monitor BMP daily.   COPD with mild acute exacerbation - presented with wheezing, worsened hypoxia, improved with bronchodilators, all clinically consistent with COPD.  Clinically improving. --Scheduled Duonebs q6h, PRN albuterol nebs --Schedule Mucinex --antibiotics as above --Continue Solu-medrol at 40 mg BID today --Start Prednisone 40 mg PO tomorrow AM   Sternal body fracture - as shown by CT-chest on admission.  Given history provided of acute onset pain in chest/abdomen, tenderness on palpation, and several compression fractures, suspect fragility fracture. --Tylenol, Percocet PRN --Lidocaine patch  Multiple Thoracic and Lumbar Compression Fractures - see imaging reports for details.  Involving T5, T7 (high grade), L2 (high grade).  Patient denies back pain and has no palpable midline tenderness along the entirety of his back.   MRI of T and L spine - T5 and T7 are acute to subacute, T3 and T4 subacute to chronic and more mild, L1 through L5 are chronic. --check vitamin D, calcium, PTH --likely fragility fractures, which would be diagnostic of osteoporosis --daughter did report pt had a bad fall directly onto his mid back, striking his back on a goose statue, several months ago earlier this year.     Vitamin D Deficiency - vitamin D level 16.8 on 9/12.   --Start vitamin D 50k units weekly.  Follow up outpatient.   Hypertension - chonric, uncontrolled on admission.  Continue Cardia, metoprolol, metoprolol, recently started on low dose lisinopril for CHF.    Coronary artery disease Hyperlipidemia --Continue Lipitor   Atrial fibrillation, chronic - currently rate controlled.  Continue metoprolol.  Warfarin per pharmacy.  Monitor INR.      Patient BMI: Body mass index is 20.02 kg/m.   DVT prophylaxis: on warfarin   Diet:  Diet Orders (From admission, onward)    Start     Ordered   10/08/19 1529  Diet Heart Room service appropriate? Yes; Fluid consistency: Thin; Fluid restriction: 1500 mL Fluid  Diet effective now       Question Answer Comment  Room service appropriate? Yes   Fluid consistency: Thin   Fluid restriction: 1500 mL Fluid      10/08/19 1530            Code Status: DNR    Subjective 10/10/19    Patient seen at bedside this AM.  He is in good spirits, says feeling a little better.  Is coughing up phlegm at times, but feels like plenty not coming up yet.  No fever/chills or other acute complaints.  Lidocaine patch helping sternum pain.   Disposition Plan & Communication   Status is: Inpatient  Inpatient status remains appropriate because of severity of illness, requiring  IV therapies and further evaluation as above.  Dispo: The patient is from: Home              Anticipated d/c is to: Home with [facility]              Anticipated d/c date is: 1-2 days              Patient currently is not medically stable for d/c.     Family Communication: spoke with daughter by phone later this afternoon   Consults, Procedures, Significant Events   Consultants:   None  Procedures:   None  Antimicrobials:   Unasyn   Vanco 9/12 >>   Objective   Vitals:   10/10/19 0800 10/10/19 0844 10/10/19 1208 10/10/19 1406  BP:  112/80 (!) 114/55   Pulse:   86 67   Resp: (!) 21 16 16    Temp:  97.9 F (36.6 C) 98.9 F (37.2 C)   TempSrc:  Oral Oral   SpO2:  97% 97% 96%  Weight:      Height:        Intake/Output Summary (Last 24 hours) at 10/10/2019 1657 Last data filed at 10/10/2019 1435 Gross per 24 hour  Intake 838 ml  Output 1050 ml  Net -212 ml   Filed Weights   10/08/19 1657 10/09/19 0454 10/10/19 0529  Weight: 65.6 kg 62.5 kg 63.3 kg    Physical Exam:  General exam: awake, alert, no acute distress, frail, pleasant Respiratory system: coarse rhonchi, no wheezes, normal respiratory effort, on 4 L/min oxygen. Cardiovascular system: normal S1/S2, RRR, no peripheral edema.   Gastrointestinal system: soft, NT, ND Central nervous system: A&O x3. no gross focal neurologic deficits, normal speech    Labs   Data Reviewed: I have personally reviewed following labs and imaging studies  CBC: Recent Labs  Lab 10/08/19 1150 10/09/19 0358 10/10/19 0305  WBC 12.8* 13.0* 16.2*  NEUTROABS 8.6*  --   --   HGB 12.0* 11.6* 10.9*  HCT 35.0* 34.7* 33.4*  MCV 86.0 88.5 88.4  PLT 209 195 175   Basic Metabolic Panel: Recent Labs  Lab 10/08/19 1150 10/09/19 0358 10/10/19 1028  NA 135 134* 139  K 4.4 4.6 3.9  CL 99 98 97*  CO2 28 26 29   GLUCOSE 108* 152* 198*  BUN 31* 33* 52*  CREATININE 1.04 1.37* 1.25*  CALCIUM 9.1 8.8* 8.8*  MG  --  2.2  --    GFR: Estimated Creatinine Clearance: 35.9 mL/min (A) (by C-G formula based on SCr of 1.25 mg/dL (H)). Liver Function Tests: Recent Labs  Lab 10/08/19 1150  AST 19  ALT 14  ALKPHOS 66  BILITOT 1.1  PROT 7.6  ALBUMIN 4.0   No results for input(s): LIPASE, AMYLASE in the last 168 hours. No results for input(s): AMMONIA in the last 168 hours. Coagulation Profile: Recent Labs  Lab 10/08/19 1831 10/09/19 0358 10/10/19 0305  INR 2.4* 2.5* 3.5*   Cardiac Enzymes: No results for input(s): CKTOTAL, CKMB, CKMBINDEX, TROPONINI in the last 168 hours. BNP (last 3 results) No  results for input(s): PROBNP in the last 8760 hours. HbA1C: No results for input(s): HGBA1C in the last 72 hours. CBG: No results for input(s): GLUCAP in the last 168 hours. Lipid Profile: No results for input(s): CHOL, HDL, LDLCALC, TRIG, CHOLHDL, LDLDIRECT in the last 72 hours. Thyroid Function Tests: No results for input(s): TSH, T4TOTAL, FREET4, T3FREE, THYROIDAB in the last 72 hours. Anemia Panel: No  results for input(s): VITAMINB12, FOLATE, FERRITIN, TIBC, IRON, RETICCTPCT in the last 72 hours. Sepsis Labs: Recent Labs  Lab 10/08/19 1150 10/08/19 1416  PROCALCITON  --  <0.10  LATICACIDVEN 1.1 0.9    Recent Results (from the past 240 hour(s))  SARS Coronavirus 2 by RT PCR (hospital order, performed in [facility] hospital lab) Nasopharyngeal Nasopharyngeal Swab     Status: None   Collection Time: 10/08/19 11:50 AM   Specimen: Nasopharyngeal Swab  Result Value Ref Range Status   SARS Coronavirus 2 NEGATIVE NEGATIVE Final    Comment: (NOTE) SARS-CoV-2 target nucleic acids are NOT DETECTED.  The SARS-CoV-2 RNA is generally detectable in upper and lower respiratory specimens during the acute phase of infection. The lowest concentration of SARS-CoV-2 viral copies this assay can detect is 250 copies / mL. A negative result does not preclude SARS-CoV-2 infection and should not be used as the sole basis for treatment or other patient management decisions.  A negative result may occur with improper specimen collection / handling, submission of specimen other than nasopharyngeal swab, presence of viral mutation(s) within the areas targeted by this assay, and inadequate number of viral copies (<250 copies / mL). A negative result must be combined with clinical observations, patient history, and epidemiological information.  Fact Sheet for Patients:   StrictlyIdeas.no  Fact Sheet for Healthcare Providers: BankingDealers.co.za  This  test is not yet approved or  cleared by the [place] FDA and has been authorized for detection and/or diagnosis of SARS-CoV-2 by FDA under an Emergency Use Authorization (EUA).  This EUA will remain in effect (meaning this test can be used) for the duration of the COVID-19 declaration under Section 564(b)(1) of the Act, 21 U.S.C. section 360bbb-3(b)(1), unless the authorization is terminated or revoked sooner.  Performed at [facility], [address] [place], [place] [address]   CULTURE, BLOOD (ROUTINE X 2) w Reflex to ID Panel     Status: Abnormal (Preliminary result)   Collection Time: 10/08/19  3:45 PM   Specimen: BLOOD  Result Value Ref Range Status   Specimen Description   Final    BLOOD RIGHT ANTECUBITAL Performed at [facility], [address] [place], [place] [address]    Special Requests   Final    BOTTLES DRAWN AEROBIC AND ANAEROBIC Blood Culture adequate volume Performed at [facility], [address] [place], [place] [address]    Culture  Setup Time   Final    Organism ID to follow [place] AND ANAEROBIC BOTTLES CRITICAL RESULT CALLED TO, READ BACK BY AND VERIFIED WITH: Dorena Bodo AT 6269 10/09/19 SDR Performed at [facility], [address] [place], [place] [address]    Culture (A)  Final    STAPHYLOCOCCUS CAPITIS THE SIGNIFICANCE OF ISOLATING THIS ORGANISM FROM A SINGLE SET OF BLOOD CULTURES WHEN MULTIPLE SETS ARE DRAWN IS UNCERTAIN. PLEASE NOTIFY THE MICROBIOLOGY DEPARTMENT WITHIN ONE WEEK IF SPECIATION AND SENSITIVITIES ARE REQUIRED. Performed at [facility], [place] [address], [place], [place] [address]    Report Status PENDING  Incomplete  CULTURE, BLOOD (ROUTINE X 2) w Reflex to ID Panel     Status: Abnormal (Preliminary result)   Collection Time: 10/08/19  3:45 PM   Specimen: BLOOD  Result Value Ref Range Status   Specimen Description   Final    BLOOD LEFT  ANTECUBITAL Performed at [facility], [address], [place], [place] [address]  Special Requests   Final    BOTTLES DRAWN AEROBIC AND ANAEROBIC Blood Culture adequate volume Performed at [facility], [address] [place], [place] [address]    Culture  Setup Time   Final    GRAM POSITIVE COCCI IN BOTH AEROBIC AND ANAEROBIC BOTTLES CRITICAL VALUE NOTED.  VALUE IS CONSISTENT WITH PREVIOUSLY REPORTED AND CALLED VALUE. Performed at [facility], [address] [place], [place] [address]    Culture (A)  Final    STAPHYLOCOCCUS CAPITIS THE SIGNIFICANCE OF ISOLATING THIS ORGANISM FROM A SINGLE SET OF BLOOD CULTURES WHEN MULTIPLE SETS ARE DRAWN IS UNCERTAIN. PLEASE NOTIFY THE MICROBIOLOGY DEPARTMENT WITHIN ONE WEEK IF SPECIATION AND SENSITIVITIES ARE REQUIRED. Performed at [facility], [place] [address], [place], [place] [address]    Report Status PENDING  Incomplete  Blood Culture ID Panel (Reflexed)     Status: Abnormal   Collection Time: 10/08/19  3:45 PM  Result Value Ref Range Status   Enterococcus faecalis NOT DETECTED NOT DETECTED Final   Enterococcus Faecium NOT DETECTED NOT DETECTED Final   Listeria monocytogenes NOT DETECTED NOT DETECTED Final   Staphylococcus species DETECTED (A) NOT DETECTED Final    Comment: CRITICAL RESULT CALLED TO, READ BACK BY AND VERIFIED WITH:  ABBY ELLINGTON AT 0907 10/09/19 SDR    Staphylococcus aureus (BCID) NOT DETECTED NOT DETECTED Final   Staphylococcus epidermidis NOT DETECTED NOT DETECTED Final   Staphylococcus lugdunensis NOT DETECTED NOT DETECTED Final   Streptococcus species NOT DETECTED NOT DETECTED Final   Streptococcus agalactiae NOT DETECTED NOT DETECTED Final   Streptococcus pneumoniae NOT DETECTED NOT DETECTED Final   Streptococcus pyogenes NOT DETECTED NOT DETECTED Final   A.calcoaceticus-baumannii NOT DETECTED NOT DETECTED Final   Bacteroides fragilis NOT DETECTED NOT DETECTED Final    Enterobacterales NOT DETECTED NOT DETECTED Final   Enterobacter cloacae complex NOT DETECTED NOT DETECTED Final   Escherichia coli NOT DETECTED NOT DETECTED Final   Klebsiella aerogenes NOT DETECTED NOT DETECTED Final   Klebsiella oxytoca NOT DETECTED NOT DETECTED Final   Klebsiella pneumoniae NOT DETECTED NOT DETECTED Final   Proteus species NOT DETECTED NOT DETECTED Final   Salmonella species NOT DETECTED NOT DETECTED Final   Serratia marcescens NOT DETECTED NOT DETECTED Final   Haemophilus influenzae NOT DETECTED NOT DETECTED Final   Neisseria meningitidis NOT DETECTED NOT DETECTED Final   Pseudomonas aeruginosa NOT DETECTED NOT DETECTED Final   Stenotrophomonas maltophilia NOT DETECTED NOT DETECTED Final   Candida albicans NOT DETECTED NOT DETECTED Final   Candida auris NOT DETECTED NOT DETECTED Final   Candida glabrata NOT DETECTED NOT DETECTED Final   Candida krusei NOT DETECTED NOT DETECTED Final   Candida parapsilosis NOT DETECTED NOT DETECTED Final   Candida tropicalis NOT DETECTED NOT DETECTED Final   Cryptococcus neoformans/gattii NOT DETECTED NOT DETECTED Final    Comment: Performed at [facility], [address], [place], [place] [address]      Imaging Studies   MR THORACIC SPINE WO CONTRAST  Result Date: 10/09/2019 CLINICAL DATA:  Initial evaluation for compression fractures. EXAM: MRI THORACIC AND LUMBAR SPINE WITHOUT CONTRAST TECHNIQUE: Multiplanar and multiecho pulse sequences of the thoracic and lumbar spine were obtained without intravenous contrast. COMPARISON:  Prior CT from 10/08/2019. FINDINGS: MRI THORACIC SPINE FINDINGS Alignment: Exaggeration of the normal thoracic kyphosis with focal kyphotic angulation about a T7 compression fracture. Underlying trace scoliosis. No listhesis. Vertebrae: Compression deformities about the superior endplates of T3 and T4 with  mild 20% height loss without significant bony retropulsion. Trace residual marrow edema seen  about these fractures, suggesting that these are late subacute to chronic in nature. Additional compression fracture of T5 with up to 35% height loss without significant bony retropulsion. Residual linear marrow edema consistent with an acute to subacute fracture (series 21, image 12). Severe compression deformity with near complete anterior height loss of T7 with associated 3 mm bony retropulsion also demonstrates some residual marrow edema, suggesting an acute to subacute component. Otherwise, vertebral body height fairly well maintained, with no other acute or chronic compression fracture. Underlying bone marrow signal intensity within normal limits. No worrisome osseous lesions. No other abnormal marrow edema. Cord:  Normal signal and morphology. Paraspinal and other soft tissues: Paraspinous soft tissues demonstrate no acute finding. Layering bilateral pleural effusions. Irregular emphysematous changes partially visualize, better evaluated on prior chest CT. Few scattered simple cysts noted about the visualized kidneys. Aortic atherosclerosis noted. Disc levels: T7-8: 3 mm bony retropulsion related to the T7 compression fracture. Secondary flattening of the ventral thecal sac with mild flattening of the ventral spinal cord, but no more than mild spinal stenosis. Otherwise, normal expected for age degenerative disc disease elsewhere within the thoracic spine. Mild scattered multilevel facet hypertrophy. No other significant canal or foraminal stenosis. MRI LUMBAR SPINE FINDINGS Segmentation:  Standard. Alignment: Trace anterolisthesis of L1 on L2, with trace retrolisthesis of L2 on L3. Trace anterolisthesis of L4 on L5. Findings chronic and facet mediated. Vertebrae: Compression fracture of L1 with up to 60% height loss and 3 mm bony retropulsion. Compression fracture of L2 with severe 80% height loss and 3 mm bony retropulsion. Biconcave compression fracture of L3 with mild 25% central height loss without bony  retropulsion. Compression fracture of the superior endplate of L4 with mild 35% height loss without bony retropulsion. Compression fracture of the superior endplate of L5 with associated 35% height loss with trace 2 mm bony retropulsion. These fractures are chronic in appearance, with no residual marrow edema. Underlying bone marrow signal intensity within normal limits. No worrisome osseous lesions. Conus medullaris and cauda equina: Conus extends to the L1 level. Conus and cauda equina appear normal. Paraspinal and other soft tissues: Paraspinous soft tissues demonstrate no acute finding. Few scattered cysts noted about the kidneys bilaterally. Aortic atherosclerosis. Disc levels: L1-2: Diffuse disc bulge with disc desiccation, with superimposed 3 mm bony retropulsion related to the L2 compression fracture. Mild facet hypertrophy. No significant spinal stenosis. Mild bilateral L1 foraminal narrowing. No impingement. L2-3: Mild diffuse disc bulge with disc desiccation. Mild facet hypertrophy. No significant spinal stenosis. Mild right L2 foraminal narrowing. No significant left foraminal encroachment. No impingement. L3-4: Mild diffuse disc bulge with disc desiccation and intervertebral disc space narrowing. Mild facet hypertrophy. No significant spinal stenosis. Foramina remain patent. L4-5: Mild diffuse disc bulge with disc desiccation and intervertebral disc space narrowing. Superimposed broad-based central to left foraminal disc protrusion flattens the left ventral thecal sac (series 4, image 25). Mild facet hypertrophy. Resultant mild to moderate left lateral recess stenosis. Central canal remains patent. Mild left L4 foraminal narrowing. No significant right foraminal stenosis. L5-S1: Disc desiccation with minimal disc bulge. Mild facet hypertrophy. No significant canal or foraminal stenosis. IMPRESSION: MR THORACIC SPINE IMPRESSION 1. Severe compression deformity of T7 with associated 3 mm bony retropulsion  and mild spinal stenosis. Associated residual marrow edema suggests an acute to subacute component. 2. Acute to subacute compression fracture of T5 with up to 35% height loss  without bony retropulsion. 3. Mild compression fractures of T3 and T4 with up to 20% height loss. No more than trace residual marrow edema, suggesting at these are late subacute to chronic. 4. Layering bilateral pleural effusions, increased from prior CT. MR LUMBAR SPINE IMPRESSION 1. Multiple compression fractures involving the L1 through L5 vertebral bodies as above, all of which are chronic in nature without residual marrow edema. No acute fracture within the lumbar spine. 2. Underlying multilevel lumbar spondylosis, most pronounced at L4-5 were there is resultant mild to moderate left lateral recess stenosis, descending left L5 nerve root level. No other significant stenosis or neural impingement. Electronically Signed   By: Jeannine Boga M.D.   On: 10/09/2019 03:08   MR LUMBAR SPINE WO CONTRAST  Result Date: 10/09/2019 CLINICAL DATA:  Initial evaluation for compression fractures. EXAM: MRI THORACIC AND LUMBAR SPINE WITHOUT CONTRAST TECHNIQUE: Multiplanar and multiecho pulse sequences of the thoracic and lumbar spine were obtained without intravenous contrast. COMPARISON:  Prior CT from 10/08/2019. FINDINGS: MRI THORACIC SPINE FINDINGS Alignment: Exaggeration of the normal thoracic kyphosis with focal kyphotic angulation about a T7 compression fracture. Underlying trace scoliosis. No listhesis. Vertebrae: Compression deformities about the superior endplates of T3 and T4 with mild 20% height loss without significant bony retropulsion. Trace residual marrow edema seen about these fractures, suggesting that these are late subacute to chronic in nature. Additional compression fracture of T5 with up to 35% height loss without significant bony retropulsion. Residual linear marrow edema consistent with an acute to subacute fracture  (series 21, image 12). Severe compression deformity with near complete anterior height loss of T7 with associated 3 mm bony retropulsion also demonstrates some residual marrow edema, suggesting an acute to subacute component. Otherwise, vertebral body height fairly well maintained, with no other acute or chronic compression fracture. Underlying bone marrow signal intensity within normal limits. No worrisome osseous lesions. No other abnormal marrow edema. Cord:  Normal signal and morphology. Paraspinal and other soft tissues: Paraspinous soft tissues demonstrate no acute finding. Layering bilateral pleural effusions. Irregular emphysematous changes partially visualize, better evaluated on prior chest CT. Few scattered simple cysts noted about the visualized kidneys. Aortic atherosclerosis noted. Disc levels: T7-8: 3 mm bony retropulsion related to the T7 compression fracture. Secondary flattening of the ventral thecal sac with mild flattening of the ventral spinal cord, but no more than mild spinal stenosis. Otherwise, normal expected for age degenerative disc disease elsewhere within the thoracic spine. Mild scattered multilevel facet hypertrophy. No other significant canal or foraminal stenosis. MRI LUMBAR SPINE FINDINGS Segmentation:  Standard. Alignment: Trace anterolisthesis of L1 on L2, with trace retrolisthesis of L2 on L3. Trace anterolisthesis of L4 on L5. Findings chronic and facet mediated. Vertebrae: Compression fracture of L1 with up to 60% height loss and 3 mm bony retropulsion. Compression fracture of L2 with severe 80% height loss and 3 mm bony retropulsion. Biconcave compression fracture of L3 with mild 25% central height loss without bony retropulsion. Compression fracture of the superior endplate of L4 with mild 35% height loss without bony retropulsion. Compression fracture of the superior endplate of L5 with associated 35% height loss with trace 2 mm bony retropulsion. These fractures are  chronic in appearance, with no residual marrow edema. Underlying bone marrow signal intensity within normal limits. No worrisome osseous lesions. Conus medullaris and cauda equina: Conus extends to the L1 level. Conus and cauda equina appear normal. Paraspinal and other soft tissues: Paraspinous soft tissues demonstrate no acute finding. Few scattered  cysts noted about the kidneys bilaterally. Aortic atherosclerosis. Disc levels: L1-2: Diffuse disc bulge with disc desiccation, with superimposed 3 mm bony retropulsion related to the L2 compression fracture. Mild facet hypertrophy. No significant spinal stenosis. Mild bilateral L1 foraminal narrowing. No impingement. L2-3: Mild diffuse disc bulge with disc desiccation. Mild facet hypertrophy. No significant spinal stenosis. Mild right L2 foraminal narrowing. No significant left foraminal encroachment. No impingement. L3-4: Mild diffuse disc bulge with disc desiccation and intervertebral disc space narrowing. Mild facet hypertrophy. No significant spinal stenosis. Foramina remain patent. L4-5: Mild diffuse disc bulge with disc desiccation and intervertebral disc space narrowing. Superimposed broad-based central to left foraminal disc protrusion flattens the left ventral thecal sac (series 4, image 25). Mild facet hypertrophy. Resultant mild to moderate left lateral recess stenosis. Central canal remains patent. Mild left L4 foraminal narrowing. No significant right foraminal stenosis. L5-S1: Disc desiccation with minimal disc bulge. Mild facet hypertrophy. No significant canal or foraminal stenosis. IMPRESSION: MR THORACIC SPINE IMPRESSION 1. Severe compression deformity of T7 with associated 3 mm bony retropulsion and mild spinal stenosis. Associated residual marrow edema suggests an acute to subacute component. 2. Acute to subacute compression fracture of T5 with up to 35% height loss without bony retropulsion. 3. Mild compression fractures of T3 and T4 with up to  20% height loss. No more than trace residual marrow edema, suggesting at these are late subacute to chronic. 4. Layering bilateral pleural effusions, increased from prior CT. MR LUMBAR SPINE IMPRESSION 1. Multiple compression fractures involving the L1 through L5 vertebral bodies as above, all of which are chronic in nature without residual marrow edema. No acute fracture within the lumbar spine. 2. Underlying multilevel lumbar spondylosis, most pronounced at L4-5 were there is resultant mild to moderate left lateral recess stenosis, descending left L5 nerve root level. No other significant stenosis or neural impingement. Electronically Signed   By: Jeannine Boga M.D.   On: 10/09/2019 03:08   ECHOCARDIOGRAM COMPLETE  Result Date: 10/10/2019    ECHOCARDIOGRAM REPORT   Patient Name:   [name] Date of Exam: 10/10/2019 Medical Rec #:  [id number]         Height:       70.0 in Accession #:    [id number]        Weight:       139.6 lb Date of Birth:  December 14, 1928         BSA:          1.791 m Patient Age:    70 years          BP:           126/57 mmHg Patient Gender: M                 HR:           74 bpm. Exam Location:  [facility] Procedure: 2D Echo Indications:     CHF I50.31  History:         Patient has prior history of Echocardiogram examinations, most                  recent 01/06/2019.  Sonographer:     Arville Go RDCS Referring Phys:  Unknown Foley NIU Diagnosing Phys: Yolonda Kida MD IMPRESSIONS  1. Left ventricular ejection fraction, by estimation, is 55 to 60%. The left ventricle has normal function. The left ventricle has no regional wall motion abnormalities. Left ventricular diastolic parameters are consistent with Grade I diastolic  dysfunction (impaired relaxation). There is the interventricular septum is flattened in diastole ('D' shaped left ventricle), consistent with right ventricular volume overload.  2. Right ventricular systolic function is moderately reduced. The right ventricular  size is moderately enlarged. There is moderately elevated pulmonary artery systolic pressure.  3. Left atrial size was mildly dilated.  4. Right atrial size was severely dilated.  5. The mitral valve is abnormal. Mild mitral valve regurgitation.  6. Tricuspid valve regurgitation is moderate to severe.  7. The aortic valve is calcified. Aortic valve regurgitation is mild. Mild to moderate aortic valve sclerosis/calcification is present, without any evidence of aortic stenosis. FINDINGS  Left Ventricle: Left ventricular ejection fraction, by estimation, is 55 to 60%. The left ventricle has normal function. The left ventricle has no regional wall motion abnormalities. The left ventricular internal cavity size was normal in size. There is  no left ventricular hypertrophy. The interventricular septum is flattened in diastole ('D' shaped left ventricle), consistent with right ventricular volume overload. Left ventricular diastolic parameters are consistent with Grade I diastolic dysfunction  (impaired relaxation). Right Ventricle: The right ventricular size is moderately enlarged. No increase in right ventricular wall thickness. Right ventricular systolic function is moderately reduced. There is moderately elevated pulmonary artery systolic pressure. Left Atrium: Left atrial size was mildly dilated. Right Atrium: Right atrial size was severely dilated. Pericardium: There is no evidence of pericardial effusion. Mitral Valve: The mitral valve is abnormal. There is mild thickening of the anterior and posterior mitral valve leaflet(s). There is moderate calcification of the anterior and posterior mitral valve leaflet(s). Mild mitral annular calcification. Mild mitral valve regurgitation. MV peak gradient, 7.6 mmHg. The mean mitral valve gradient is 3.0 mmHg. Tricuspid Valve: The tricuspid valve is normal in structure. Tricuspid valve regurgitation is moderate to severe. Aortic Valve: The aortic valve is calcified. Aortic  valve regurgitation is mild. Aortic regurgitation PHT measures 540 msec. Mild to moderate aortic valve sclerosis/calcification is present, without any evidence of aortic stenosis. Aortic valve mean gradient measures 7.0 mmHg. Aortic valve peak gradient measures 15.5 mmHg. Aortic valve area, by VTI measures 1.44 cm. Pulmonic Valve: The pulmonic valve was normal in structure. Pulmonic valve regurgitation is not visualized. Aorta: The aortic root was not well visualized. IAS/Shunts: No atrial level shunt detected by color flow Doppler.  LEFT VENTRICLE PLAX 2D LVIDd:         4.82 cm  Diastology LVIDs:         3.34 cm  LV e' medial:    6.53 cm/s LV PW:         1.13 cm  LV E/e' medial:  13.2 LV IVS:        1.15 cm  LV e' lateral:   11.10 cm/s LVOT diam:     1.90 cm  LV E/e' lateral: 7.8 LV SV:         54 LV SV Index:   30 LVOT Area:     2.84 cm  RIGHT VENTRICLE RV Basal diam:  3.81 cm RV S prime:     18.00 cm/s TAPSE (M-mode): 2.4 cm LEFT ATRIUM             Index       RIGHT ATRIUM           Index LA diam:        4.50 cm 2.51 cm/m  RA Area:     28.70 cm LA Vol (A2C):   99.2 ml 55.38 ml/m RA Volume:  110.00 ml 61.41 ml/m LA Vol (A4C):   59.5 ml 33.22 ml/m LA Biplane Vol: 81.0 ml 45.22 ml/m  AORTIC VALVE                    PULMONIC VALVE AV Area (Vmax):    1.26 cm     PV Vmax:       0.80 m/s AV Area (Vmean):   1.25 cm     PV Peak grad:  2.6 mmHg AV Area (VTI):     1.44 cm AV Vmax:           197.00 cm/s AV Vmean:          125.000 cm/s AV VTI:            0.379 m AV Peak Grad:      15.5 mmHg AV Mean Grad:      7.0 mmHg LVOT Vmax:         87.50 cm/s LVOT Vmean:        55.200 cm/s LVOT VTI:          0.192 m LVOT/AV VTI ratio: 0.51 AI PHT:            540 msec  AORTA Ao Root diam: 3.00 cm Ao Asc diam:  3.20 cm MITRAL VALVE               TRICUSPID VALVE MV Area (PHT): 4.68 cm    TV Peak grad:   46.6 mmHg MV Peak grad:  7.6 mmHg    TV Vmax:        3.42 m/s MV Mean grad:  3.0 mmHg MV Vmax:       1.38 m/s    SHUNTS MV  Vmean:      73.1 cm/s   Systemic VTI:  0.19 m MV Decel Time: 162 msec    Systemic Diam: 1.90 cm MV E velocity: 86.50 cm/s Dwayne Prince Rome MD Electronically signed by Yolonda Kida MD Signature Date/Time: 10/10/2019/12:05:53 PM    Final      Medications   Scheduled Meds: . atorvastatin  20 mg Oral Daily  . dextromethorphan-guaiFENesin  1 tablet Oral BID  . diltiazem  300 mg Oral Daily  . ipratropium-albuterol  3 mL Nebulization TID  . lidocaine  1 patch Transdermal Q24H  . lisinopril  2.5 mg Oral Daily  . loratadine  10 mg Oral Daily  . mouth rinse  15 mL Mouth Rinse BID  . methylPREDNISolone (SOLU-MEDROL) injection  40 mg Intravenous Q12H  . metoprolol tartrate  50 mg Oral BID  . sodium chloride flush  3 mL Intravenous Q12H  . Warfarin - Pharmacist Dosing Inpatient   Does not apply q1600   Continuous Infusions: . sodium chloride 250 mL (10/08/19 2237)  . ampicillin-sulbactam (UNASYN) IV 3 g (10/10/19 0846)  . [START ON 10/11/2019] vancomycin         LOS: 2 days    Time spent: 25 minutes with > 50% spent in coordinating care and direct patient contact    Ezekiel Slocumb, DO [facility]  10/10/2019, 4:57 PM    If 7PM-7AM, please contact night-coverage. How to contact the [facility] Attending or Consulting provider Home or covering provider during after hours [place], for this patient?    1. Check the care team in [facility] and look for a) attending/consulting [facility] provider listed and b) the [facility] team listed 2. Log into [email or web address] and use [place]'s universal password to access.  If you do not have the password, please contact the hospital operator. 3. Locate the [facility] provider you are looking for under [facility] and page to a number that you can be directly reached. 4. If you still have difficulty reaching the provider, please page the [facility] (Director on Call) for the Hospitalists listed on amion for assistance.

## 2019-10-10 NOTE — Consult Note (Signed)
ANTICOAGULATION CONSULT NOTE - Initial Consult  Pharmacy Consult for warfarin Indication: atrial fibrillation  No Known Allergies  Patient Measurements: Height: 5\' 10"  (177.8 cm) Weight: 63.3 kg (139 lb 8.8 oz) IBW/kg (Calculated) : 73  Vital Signs: Temp: 97.9 F (36.6 C) (09/12 0844) Temp Source: Oral (09/12 0844) BP: 112/80 (09/12 0844) Pulse Rate: 86 (09/12 0844)  Labs: Recent Labs    10/08/19 1150 10/08/19 1150 10/08/19 1416 10/08/19 1831 10/09/19 0358 10/10/19 0305  HGB 12.0*   < >  --   --  11.6* 10.9*  HCT 35.0*  --   --   --  34.7* 33.4*  PLT 209  --   --   --  195 188  LABPROT  --   --   --  25.4* 26.5* 33.8*  INR  --   --   --  2.4* 2.5* 3.5*  CREATININE 1.04  --   --   --  1.37*  --   TROPONINIHS 12  --  16  --   --   --    < > = values in this interval not displayed.    Estimated Creatinine Clearance: 32.7 mL/min (A) (by C-G formula based on SCr of 1.37 mg/dL (H)).   Medical History: Past Medical History:  Diagnosis Date  . CHF (congestive heart failure) ([place])   . COPD (chronic obstructive pulmonary disease) ([place])   . Coronary artery disease   . Hypertension     Medications:  Medications Prior to Admission  Medication Sig Dispense Refill Last Dose  . albuterol (PROVENTIL) (2.5 MG/3ML) 0.083% nebulizer solution Take 3 mLs (2.5 mg total) by nebulization every 6 (six) hours as needed for wheezing or shortness of breath. 75 mL 0 PRN at PRN  . albuterol (VENTOLIN HFA) 108 (90 Base) MCG/ACT inhaler Inhale into the lungs every 6 (six) hours as needed for wheezing or shortness of breath.   PRN at PRN  . atorvastatin (LIPITOR) 20 MG tablet Take 20 mg by mouth daily.   10/07/2019 at Unknown time  . CARTIA XT 300 MG 24 hr capsule Take 300 mg by mouth daily.   10/07/2019 at Unknown time  . fexofenadine (ALLEGRA) 180 MG tablet Take 180 mg by mouth daily.   PRN at PRN  . Fluticasone-Umeclidin-Vilant (TRELEGY ELLIPTA) 100-62.5-25 MCG/INH AEPB Inhale 1 puff into the  lungs daily.    10/07/2019 at Unknown time  . furosemide (LASIX) 20 MG tablet TAKE 1 TABLET BY MOUTH DAILY 30 tablet 5 10/07/2019 at Unknown time  . metoprolol tartrate (LOPRESSOR) 50 MG tablet Take 50 mg by mouth 2 (two) times daily.   10/07/2019 at Unknown time  . mometasone (NASONEX) 50 MCG/ACT nasal spray Place 1 spray into the nose daily. Each nostril   Past Week at PRN  . potassium chloride (KLOR-CON) 10 MEQ tablet Take 10 mEq by mouth daily.   10/07/2019 at Unknown time  . predniSONE (DELTASONE) 10 MG tablet Take 5 mg by mouth daily with breakfast.   10/07/2019 at Unknown time  . traZODone (DESYREL) 50 MG tablet Take 50 mg by mouth at bedtime as needed for sleep.    Past Month at PRN  . warfarin (COUMADIN) 2 MG tablet Take 2 mg by mouth daily.    10/07/2019 at 1800  . warfarin (COUMADIN) 1 MG tablet Take 1 mg by mouth daily.   10/04/2019    Assessment: 86 year old male presenting with cough, chest pain, and shortness of breath. Pt admitted for  acute on chronic respiratory failure. CT showing possible aspiration pneumonia. PMH includes atrial fibrillation, CHF, COPD, CAD, and HTN. Pharmacy has been consulted to restart home warfarin. Pt was taking 2 mg daily prior to admission.   Goal of Therapy:  INR 2-3 Monitor platelets by anticoagulation protocol: Yes   Date INR Dose  9/10 2.4 2 mg  9/11 2.5 2 mg  9/12 3.5                Plan:  INR 3.5 supratherapeutic - likely elevated per interaction with Unasyn  Will hold warfarin dose today  Will continue to monitor for drug interactions with warfarin Re-check INR and CBC daily with AM labs  Lu Duffel, PharmD, BCPS Clinical Pharmacist 10/10/2019 10:35 AM

## 2019-10-10 NOTE — Progress Notes (Signed)
*  PRELIMINARY RESULTS* Echocardiogram 2D Echocardiogram has been performed.  [name] [name] 10/10/2019, 9:29 AM

## 2019-10-10 NOTE — Progress Notes (Signed)
Pharmacy Antibiotic Note  [name] is a 86 y.o. male admitted on 10/08/2019. Pharmacy consulted for Unasyn dosing for aspiration pneumonia. Blood cultures with 4/4 bottles staph SPECIES (methicillin resistance testing pending). Patient initially without signs of infection, so patient monitored on Unasyn. Today patient with increasing WBC. Consult for vancomycin.  Plan: Continue Unasyn 3 g IV q6h  Vancomycin 1250 mg IV x 1 followed by 1000 mg IV q24h. Renal function improved overnight. Will continue to follow closely.  Height: 5\' 10"  (177.8 cm) Weight: 63.3 kg (139 lb 8.8 oz) IBW/kg (Calculated) : 73  Temp (24hrs), Avg:97.9 F (36.6 C), Min:97.8 F (36.6 C), Max:98.1 F (36.7 C)  Recent Labs  Lab 10/08/19 1150 10/08/19 1416 10/09/19 0358 10/10/19 0305 10/10/19 1028  WBC 12.8*  --  13.0* 16.2*  --   CREATININE 1.04  --  1.37*  --  1.25*  LATICACIDVEN 1.1 0.9  --   --   --     Estimated Creatinine Clearance: 35.9 mL/min (A) (by C-G formula based on SCr of 1.25 mg/dL (H)).    No Known Allergies  Antimicrobials this admission: Unasyn 9/10 >> Vancomycin 9/12 >>   Microbiology results: 9/10 BCx: 4/4 staph SPECIES   Thank you for allowing pharmacy to be a part of this patient's care.  Tawnya Crook, PharmD 10/10/2019 11:24 AM

## 2019-10-11 LAB — BASIC METABOLIC PANEL
Anion gap: 8 (ref 5–15)
BUN: 55 mg/dL — ABNORMAL HIGH (ref 8–23)
CO2: 30 mmol/L (ref 22–32)
Calcium: 8.5 mg/dL — ABNORMAL LOW (ref 8.9–10.3)
Chloride: 100 mmol/L (ref 98–111)
Creatinine, Ser: 1.12 mg/dL (ref 0.61–1.24)
GFR calc Af Amer: >60 mL/min (ref >60)
GFR calc non Af Amer: 58 mL/min — ABNORMAL LOW (ref >60)
Glucose, Bld: 156 mg/dL — ABNORMAL HIGH (ref 70–99)
Potassium: 3.8 mmol/L (ref 3.5–5.1)
Sodium: 138 mmol/L (ref 135–145)

## 2019-10-11 LAB — PROTIME-INR
INR: 4 — ABNORMAL HIGH (ref 0.8–1.2)
Prothrombin Time: 38.1 seconds — ABNORMAL HIGH (ref 11.4–15.2)

## 2019-10-11 LAB — CBC
HCT: 31.9 % — ABNORMAL LOW (ref 39.0–52.0)
Hemoglobin: 10.7 g/dL — ABNORMAL LOW (ref 13.0–17.0)
MCH: 29.6 pg (ref 26.0–34.0)
MCHC: 33.5 g/dL (ref 30.0–36.0)
MCV: 88.1 fL (ref 80.0–100.0)
Platelets: 173 10*3/uL (ref 150–400)
RBC: 3.62 MIL/uL — ABNORMAL LOW (ref 4.22–5.81)
RDW: 15.3 % (ref 11.5–15.5)
WBC: 12.8 10*3/uL — ABNORMAL HIGH (ref 4.0–10.5)
nRBC: 0 % (ref 0.0–0.2)

## 2019-10-11 MED ORDER — AZITHROMYCIN 250 MG PO TABS
250.0000 mg | ORAL_TABLET | Freq: Every day | ORAL | Status: DC
  Administered 2019-10-12 – 2019-10-14 (×3): 250 mg via ORAL
  Filled 2019-10-11 (×3): qty 1

## 2019-10-11 MED ORDER — ACETAMINOPHEN 325 MG PO TABS: 650.0000 mg | ORAL_TABLET | Freq: Four times a day (QID) | ORAL | Status: DC | PRN

## 2019-10-11 MED ORDER — POLYETHYLENE GLYCOL 3350 17 G PO PACK
17.0000 g | PACK | Freq: Every day | ORAL | Status: DC | PRN
  Administered 2019-10-13: 17 g via ORAL
  Filled 2019-10-11: qty 1

## 2019-10-11 MED ORDER — FUROSEMIDE 20 MG PO TABS
20.0000 mg | ORAL_TABLET | Freq: Every day | ORAL | Status: DC
  Administered 2019-10-11 – 2019-10-14 (×4): 20 mg via ORAL
  Filled 2019-10-11 (×4): qty 1

## 2019-10-11 MED ORDER — POLYETHYLENE GLYCOL 3350 17 G PO PACK
17.0000 g | PACK | Freq: Once | ORAL | Status: AC
  Administered 2019-10-11: 17 g via ORAL
  Filled 2019-10-11: qty 1

## 2019-10-11 MED ORDER — AZITHROMYCIN 250 MG PO TABS
500.0000 mg | ORAL_TABLET | Freq: Every day | ORAL | Status: AC
  Administered 2019-10-11: 500 mg via ORAL
  Filled 2019-10-11: qty 2

## 2019-10-11 NOTE — Consult Note (Signed)
ANTICOAGULATION CONSULT NOTE - Initial Consult  Pharmacy Consult for warfarin Indication: atrial fibrillation  No Known Allergies  Patient Measurements: Height: 5\' 10"  (177.8 cm) Weight: 63.3 kg (139 lb 8.8 oz) IBW/kg (Calculated) : 73  Vital Signs: Temp: 98.4 F (36.9 C) (09/13 0235) Temp Source: Oral (09/12 2326) BP: 137/70 (09/13 0235) Pulse Rate: 62 (09/13 0235)  Labs: Recent Labs     0000 10/08/19 1150 10/08/19 1416 10/08/19 1831 10/09/19 0358 10/09/19 0358 10/10/19 0305 10/10/19 1028 10/11/19 0411  HGB   < > 12.0*  --   --  11.6*   < > 10.9*  --  10.7*  HCT   < > 35.0*  --   --  34.7*  --  33.4*  --  31.9*  PLT   < > 209  --   --  195  --  188  --  173  LABPROT  --   --   --    < > 26.5*  --  33.8*  --  38.1*  INR  --   --   --    < > 2.5*  --  3.5*  --  4.0*  CREATININE   < > 1.04  --   --  1.37*  --   --  1.25* 1.12  TROPONINIHS  --  12 16  --   --   --   --   --   --    < > = values in this interval not displayed.    Estimated Creatinine Clearance: 40 mL/min (by C-G formula based on SCr of 1.12 mg/dL).   Medical History: Past Medical History:  Diagnosis Date  . CHF (congestive heart failure) ([place])   . COPD (chronic obstructive pulmonary disease) ([place])   . Coronary artery disease   . Hypertension     Medications:  Medications Prior to Admission  Medication Sig Dispense Refill Last Dose  . albuterol (PROVENTIL) (2.5 MG/3ML) 0.083% nebulizer solution Take 3 mLs (2.5 mg total) by nebulization every 6 (six) hours as needed for wheezing or shortness of breath. 75 mL 0 PRN at PRN  . albuterol (VENTOLIN HFA) 108 (90 Base) MCG/ACT inhaler Inhale into the lungs every 6 (six) hours as needed for wheezing or shortness of breath.   PRN at PRN  . atorvastatin (LIPITOR) 20 MG tablet Take 20 mg by mouth daily.   10/07/2019 at Unknown time  . CARTIA XT 300 MG 24 hr capsule Take 300 mg by mouth daily.   10/07/2019 at Unknown time  . fexofenadine (ALLEGRA) 180 MG tablet  Take 180 mg by mouth daily.   PRN at PRN  . Fluticasone-Umeclidin-Vilant (TRELEGY ELLIPTA) 100-62.5-25 MCG/INH AEPB Inhale 1 puff into the lungs daily.    10/07/2019 at Unknown time  . furosemide (LASIX) 20 MG tablet TAKE 1 TABLET BY MOUTH DAILY 30 tablet 5 10/07/2019 at Unknown time  . metoprolol tartrate (LOPRESSOR) 50 MG tablet Take 50 mg by mouth 2 (two) times daily.   10/07/2019 at Unknown time  . mometasone (NASONEX) 50 MCG/ACT nasal spray Place 1 spray into the nose daily. Each nostril   Past Week at PRN  . potassium chloride (KLOR-CON) 10 MEQ tablet Take 10 mEq by mouth daily.   10/07/2019 at Unknown time  . predniSONE (DELTASONE) 10 MG tablet Take 5 mg by mouth daily with breakfast.   10/07/2019 at Unknown time  . traZODone (DESYREL) 50 MG tablet Take 50 mg by mouth at bedtime as needed for  sleep.    Past Month at PRN  . warfarin (COUMADIN) 2 MG tablet Take 2 mg by mouth daily.    10/07/2019 at 1800  . warfarin (COUMADIN) 1 MG tablet Take 1 mg by mouth daily.   10/04/2019    Assessment: 86 year old male presenting with cough, chest pain, and shortness of breath. Pt admitted for acute on chronic respiratory failure. CT showing possible aspiration pneumonia. PMH includes atrial fibrillation, CHF, COPD, CAD, and HTN. Pharmacy has been consulted to restart home warfarin. Pt was taking warfarin 2 mg daily prior to admission.   Hgb stable between 10-11 Plt wnl, however trending down  Goal of Therapy:  INR 2-3 Monitor platelets by anticoagulation protocol: Yes   Date INR Dose  9/10 2.4 2 mg  9/11 2.5 2 mg  9/12 3.5 Held  9/13 4.0            Plan:  INR 4.0 supratherapeutic - likely elevated d/t interaction with Unasyn (started 9/10)  Will hold warfarin dose again tonight  Continue to monitor for drug interactions with warfarin Re-check INR and CBC daily with AM labs  Sherilyn Banker, PharmD Pharmacy Resident  10/11/2019 7:46 AM

## 2019-10-11 NOTE — Progress Notes (Signed)
Mobility Specialist - Progress Note   10/11/19 1536  Mobility  Activity Ambulated in hall  Level of Assistance Modified independent, requires aide device or extra time  Assistive Device Front wheel walker  Distance Ambulated (ft) 305 ft  Mobility Response Tolerated well  Mobility performed by Mobility specialist  $Mobility charge 1 Mobility    Pre-mobility: 77 HR, 141/70 BP, 100% SpO2 During mobility: 94 HR, 86% SpO2 Post-mobility: 79 HR, 152/69 BP, 100% SpO2   Pt was lying in bed upon arrival utilizing 3L [place] O2. Pt agreed to session. Pt was SBA getting to EOB and modI while ambulating with RW. Pt c/o pain in shoulder/neck/chest area, but stated that "it is something he's been dealing with since his injury and that it goes away when he sits down." Pt ambulated a total of 305' with no LOB. Heavy breathing was noted, O2 desat to 86%. With 1 standing rest break, oxygen levels increased quickly. Mobility wanted to attempt ambulation without AD, but d/t pt c/o feeling weak in LE during ambulation, attempt was postponed. Overall, pt tolerated session well. Pt was left in recliner with all needs in reach, and no further c/o of pain. Nurse was notified.    Kathee [name] Mobility Specialist 10/11/19, 3:45 PM

## 2019-10-11 NOTE — Plan of Care (Signed)
  Problem: Activity: ?Goal: Ability to tolerate increased activity will improve ?Outcome: Progressing ?  ?Problem: Clinical Measurements: ?Goal: Ability to maintain a body temperature in the normal range will improve ?Outcome: Progressing ?  ?Problem: Respiratory: ?Goal: Ability to maintain adequate ventilation will improve ?Outcome: Progressing ?Goal: Ability to maintain a clear airway will improve ?Outcome: Progressing ?  ?Problem: Education: ?Goal: Knowledge of General Education information will improve ?Description: Including pain rating scale, medication(s)/side effects and non-pharmacologic comfort measures ?Outcome: Progressing ?  ?Problem: Health Behavior/Discharge Planning: ?Goal: Ability to manage health-related needs will improve ?Outcome: Progressing ?  ?Problem: Clinical Measurements: ?Goal: Ability to maintain clinical measurements within normal limits will improve ?Outcome: Progressing ?Goal: Will remain free from infection ?Outcome: Progressing ?Goal: Diagnostic test results will improve ?Outcome: Progressing ?Goal: Respiratory complications will improve ?Outcome: Progressing ?Goal: Cardiovascular complication will be avoided ?Outcome: Progressing ?  ?Problem: Activity: ?Goal: Risk for activity intolerance will decrease ?Outcome: Progressing ?  ?Problem: Nutrition: ?Goal: Adequate nutrition will be maintained ?Outcome: Progressing ?  ?Problem: Coping: ?Goal: Level of anxiety will decrease ?Outcome: Progressing ?  ?Problem: Elimination: ?Goal: Will not experience complications related to bowel motility ?Outcome: Progressing ?Goal: Will not experience complications related to urinary retention ?Outcome: Progressing ?  ?Problem: Pain Managment: ?Goal: General experience of comfort will improve ?Outcome: Progressing ?  ?Problem: Safety: ?Goal: Ability to remain free from injury will improve ?Outcome: Progressing ?  ?Problem: Skin Integrity: ?Goal: Risk for impaired skin integrity will decrease ?Outcome:  Progressing ?  ?Problem: Education: ?Goal: Ability to demonstrate management of disease process will improve ?Outcome: Progressing ?Goal: Ability to verbalize understanding of medication therapies will improve ?Outcome: Progressing ?Goal: Individualized Educational Video(s) ?Outcome: Progressing ?  ?Problem: Activity: ?Goal: Capacity to carry out activities will improve ?Outcome: Progressing ?  ?Problem: Cardiac: ?Goal: Ability to achieve and maintain adequate cardiopulmonary perfusion will improve ?Outcome: Progressing ?  ?

## 2019-10-11 NOTE — Care Management Important Message (Signed)
Important Message  Patient Details  Name: [name] MRN: [id number] Date of Birth: [birth year]   Medicare Important Message Given:  Yes     Dannette Barbara 10/11/2019, 2:25 PM

## 2019-10-11 NOTE — Progress Notes (Addendum)
Pharmacy Antibiotic Note  [name] is a 86 y.o. male admitted on 10/08/2019. Pharmacy consulted for Unasyn dosing for aspiration pneumonia. Blood cultures with 4/4 bottles staph capitis (methicillin resistance testing pending). Patient initially without signs of infection, so patient monitored on Unasyn. Today patient with increasing WBC. Pharmacy has been consulted for vancomycin dosing.  9/10 CT chest: Bronchial plugging in the dependent left lower lobe. Minimal dependent bibasilar atelectasis and/or consolidation and trace pleural effusions. Findings suggest aspiration. Day 4 total antibiotics, WBC trending down, afebrile. Scr   Plan: Continue Unasyn 3 g IV q6h  Vancomycin 1250 mg IV x 1 followed by 1000 mg IV q24h. Renal function improved overnight. Will order vanc levels prior to 4th or 5th dose --Pt Crcl on border for dose change; continue to monitor, may warrant dose change tomorrow  Height: 5\' 10"  (177.8 cm) Weight: 63.3 kg (139 lb 8.8 oz) IBW/kg (Calculated) : 73  Temp (24hrs), Avg:98.4 F (36.9 C), Min:97.9 F (36.6 C), Max:98.9 F (37.2 C)  Recent Labs  Lab 10/08/19 1150 10/08/19 1416 10/09/19 0358 10/10/19 0305 10/10/19 1028 10/11/19 0411  WBC 12.8*  --  13.0* 16.2*  --  12.8*  CREATININE 1.04  --  1.37*  --  1.25* 1.12  LATICACIDVEN 1.1 0.9  --   --   --   --     Estimated Creatinine Clearance: 40 mL/min (by C-G formula based on SCr of 1.12 mg/dL).    No Known Allergies  Antimicrobials this admission: Unasyn 9/10 >> Vancomycin 9/12 >>   Microbiology results: 9/10 BCID staph species 9/10 BCx: 4/4 staph SPECIES; staph capitis  9/10 Sputum: sent  Thank you for allowing pharmacy to be a part of this patients care.  Sherilyn Banker, PharmD Pharmacy Resident  10/11/2019 7:57 AM

## 2019-10-11 NOTE — Progress Notes (Signed)
   Heart Failure Nurse Navigator Note:  HFpEF- 55-60 %, Grade I Diastolic Dysfunction  Initial visit   He presented to the hospital with worsening SOB, lower extremity edema Found  to have JVD, bilateral pleural  effusions and BNP 1004.  Comorbidities:  Persistent A fib Coronary artery disease Hyperlipidemia Hypertension COPD  Medications:  Lipitor 20 mg daily Diltiazem 300 mg daily Lisinopril 2.5 mg daily  Metoprolol tartrate 50 mg BID   Currently holding lasix.    Labs:  Sodium 138, potassium 3.8,BUN 55, creatinine 1.12, albumin 4.0.   Weight:  63.3 kg  Intake-881 ml Output-1200 +   Spoke with patient today, by using teach back method asked the patient how he is going to take care of his heart failure, he repeated back by weighing myself daily and if I gain 2 or 3 pounds over night or 5 pounds in a week I am going to call my doctor. Also he states he does not use salt at the table or cook with it  He states he lives alone and he and his daughter do the cooking.  He rarely eats at [employer].  He was given heart .failure booklet and magnet. Will follow up with him tomorrow and review booklet.       Pricilla Riffle RN, CHFN

## 2019-10-11 NOTE — Progress Notes (Signed)
Christopher NOTE    GREYSEN DEVINO   OIT:[id number]  DOB: 09-Nov-1928  PCP: Baxter Hire, MD    DOA: 10/08/2019 LOS: 3   Brief Narrative   SADIEL Good is a 86 y.o. male with medical history of hypertension, hyperlipidemia, COPD, on 2 L/min oxygen at home, CHF, CAD, atrial fibrillation on Coumadin, COVID-19 infection, who presented to ED on 10/08/19 with progressive shortness of breath, wet sounding cough but without sputum production, mid-anterior chest wall pain, and O2 desats to low 80's on his usual 2 L/min.    No fever or chills.  Onset of his pain occurred about a week ago, acutely when he got up from seated to pull a chair towards himself to put his legs up, as he always does.  He reported feeling "something pulling from my tummy up to my chest".  Evaluation in the ED - BNP elevated 1004, troponin and lactate normal, leukocytosis WBC 12.8k, negative Covid-19 PCR.  BP uncontrolled 202/66 >> 176/84, afebrile, HR 99, tachypneic RR 28.   Imaging -  CXR Increasing rounded density in the left apex when compared with the prior exam, possibly evolving infiltrate/mass.  CT Chest -  1. ...partially calcified biapical pleuroparenchymal scarring, which is not significantly changed compared to prior examination dated 2017 and accounts for radiographic abnormality. No evidence of mass. 2. Bronchial plugging in the dependent left lower lobe. Minimal dependent bibasilar atelectasis and/or consolidation and trace pleural effusions. Findings suggest aspiration. 3. There is a buckled fracture deformity of the mid sternal body with overlying soft tissue edema, new compared to prior examination.  Correlate for recent injury an acute point tenderness.  4. There are multiple vertebral body wedge and endplate deformities, several which are new compared to prior examination dated 2017, particularly a high-grade wedge deformity of the T7 vertebral body, a more subtle superior endplate deformity of  the T5 vertebral body, and an increased, now high-grade wedge deformity of the L2 vertebral body. These are of uncertain acuity. Correlate for acute point tenderness. MRI may be used to assess for marrow edema and fracture acuity if desired.  5. Moderate to severe emphysema. 6. Coronary artery disease. 7. Aortic Atherosclerosis (ICD10-I70.0). Aortic valve calcifications. Enlargement of the proximal descending thoracic aorta, measuring up to 3.6 x 3.3 cm, slightly enlarged compared to prior examination at which time it measured 3.2 x 3.2 cm. Recommend annual imaging followup by CTA or MRA if clinically appropriate.      Assessment & Plan   Principal Problem:   Acute on chronic respiratory failure with hypoxia (HCC) Active Problems:   Hypertension   Coronary artery disease   HLD (hyperlipidemia)   Atrial fibrillation, chronic (HCC)   Acute on chronic diastolic CHF (congestive heart failure) (HCC)   COPD exacerbation (HCC)   Aspiration pneumonia (HCC)   Sepsis (HCC)   Acute on chronic diastolic (congestive) heart failure (HCC)   Acute on chronic respiratory failure with hypoxia - POA with progressive shortness of breath and increased oxygen requirement of 3-4 l/min from baseline 2 l/min.   Multifactorial likely aspiration pneumonia/pneumonitis, and some elements of both dCHF (edema, positive JVD, BNP 1004) and mild COPD exacerbation with wheezing. --supplemental O2, maintain O2 sat > 90% --further mgmt as below   Severe sepsis secondary to Aspiration pneumonia - sepsis present on admission as evidenced by tachycardia, tachypnea, leukocytosis, worsened hypoxia reflective of organ dysfunction.  Lactic acid normal.   --Continue Unasyn --Follow blood & sputum cultures --Prelim blood culture growing  staph capitis sp.  Follow for susceptibilities.  Possibly contaminate but grew in both sets. --Repeat blood cultures ordered  --Continue Vancomycin for now also   Acute on chronic  diastolic CHF / Valvular heart disease -  Presented with worsened hypoxia, elevated BNP, JVD, edema, pleural effusions, shortness of breath, all consistent with CHF.   Echo in 12/2018 showed EF of 54-65%, grade 1 diastolic dysfunction, mild LVH.   Echo on 9/12: EF 55-60%, grade 1 dd, signs of RV volume overload, moderate PASP elevation, severely dilated RA, mod-severe TR, mild MR. Net IO Since Admission: -252.18 mL [10/11/19 1548] --AKI resolved, will resume his home dose Lasix 20 mg daily  --strict I/O's, daily weights --monitor renal function and electrolytes --Low sodium diet, fluid restriction --repeat CXR in AM   Acute Kidney Injury - Not present on admission, has resolved.  On day 2, Cr bumped 1.04>>1.37>>1.25, likely due to diuresis.   Hold Lasix as above.  He appears euvolemic at this point.  Monitor BMP daily.   COPD with mild acute exacerbation - presented with wheezing, worsened hypoxia, improved with bronchodilators, all clinically consistent with COPD.  Clinically improving. --Scheduled Duonebs q6h, PRN albuterol nebs --Schedule Mucinex --antibiotics as above, add Z-pack --Continue Solu-medrol at 40 mg BID, continues wheezing today --Continue Prednisone 40 mg daily   Sternal body fracture - as shown by CT-chest on admission.  Given history provided of acute onset pain in chest/abdomen, tenderness on palpation, and several compression fractures, suspect fragility fracture. --Tylenol, Percocet PRN --Lidocaine patch   Multiple Thoracic and Lumbar Compression Fractures - see imaging reports for details.  Involving T5, T7 (high grade), L2 (high grade).  Patient denies back pain and has no palpable midline tenderness along the entirety of his back.   MRI of T and L spine - T5 and T7 are acute to subacute, T3 and T4 subacute to chronic and more mild, L1 through L5 are chronic. --check vitamin D, calcium, PTH --likely fragility fractures, which would be diagnostic of  osteoporosis --daughter did report pt had a bad fall directly onto his mid back, striking his back on a goose statue, several months ago earlier this year.    Osteoporosis - as evidenced by several compression fractures, sternal fracture without trauma.  Will need outpatient follow up.  Started on vit D as below.  Vitamin D Deficiency - vitamin D level 16.8 on 9/12.   --Start vitamin D 50k units weekly.  Follow up outpatient.   Hypertension - chonric, uncontrolled on admission.  Continue Cardia, metoprolol, metoprolol, recently started on low dose lisinopril for CHF.    Coronary artery disease Hyperlipidemia --Continue Lipitor   Atrial fibrillation, chronic - currently rate controlled.  Continue metoprolol.  Warfarin per pharmacy.  Monitor INR.    Patient BMI: Body mass index is 20.02 kg/m.   DVT prophylaxis: on warfarin   Diet:  Diet Orders (From admission, onward)    Start     Ordered   10/08/19 1529  Diet Heart Room service appropriate? Yes; Fluid consistency: Thin; Fluid restriction: 1500 mL Fluid  Diet effective now       Question Answer Comment  Room service appropriate? Yes   Fluid consistency: Thin   Fluid restriction: 1500 mL Fluid      10/08/19 1530            Code Status: DNR    Subjective 10/11/19    Patient seen at bedside this AM.  Says he feels 75% better than at  time of admission.  Pain med and lidocaine patch have really helped his chest/sternum pain and he asks for these to be prescribed at d/c.  Denies fever or chills.  Does feel a bit wheezy.  No other complaints.    Disposition Plan & Communication   Status is: Inpatient  Inpatient status remains appropriate because of severity of illness, requiring IV therapies and further evaluation as above.  Awaiting culture sensitivities and repeat cultures preliminary results.  Dispo: The patient is from: Home              Anticipated d/c is to: Home with [facility]              Anticipated d/c date is:  1-2 days              Patient currently is not medically stable for d/c.     Family Communication: none at bedside, will attempt to call daughter   Consults, Procedures, Significant Events   Consultants:   None  Procedures:   None  Antimicrobials:   Unasyn   Vanco 9/12 >>  Z-pack 9/13 >> (5 days)   Objective   Vitals:   10/11/19 0810 10/11/19 1007 10/11/19 1133 10/11/19 1406  BP: (!) 170/91  (!) 169/63   Pulse: 76  74   Resp: 18  19   Temp: 98.1 F (36.7 C)  97.7 F (36.5 C)   TempSrc:   Oral   SpO2: 98% 96% 100% 98%  Weight:      Height:        Intake/Output Summary (Last 24 hours) at 10/11/2019 1548 Last data filed at 10/11/2019 1345 Gross per 24 hour  Intake 881.06 ml  Output 725 ml  Net 156.06 ml   Filed Weights   10/08/19 1657 10/09/19 0454 10/10/19 0529  Weight: 65.6 kg 62.5 kg 63.3 kg    Physical Exam:  General exam: awake, alert, no acute distress, frail, pleasant Respiratory system: decreased aeration today with scattered expiratory wheezes, rhonchi left base, normal respiratory effort, on 4 L/min oxygen. Cardiovascular system: normal S1/S2, RRR, no peripheral edema.   Gastrointestinal system: soft, NT, ND, +bowel sounds Central nervous system: A&O x3. no gross focal neurologic deficits, normal speech    Labs   Data Reviewed: I have personally reviewed following labs and imaging studies  CBC: Recent Labs  Lab 10/08/19 1150 10/09/19 0358 10/10/19 0305 10/11/19 0411  WBC 12.8* 13.0* 16.2* 12.8*  NEUTROABS 8.6*  --   --   --   HGB 12.0* 11.6* 10.9* 10.7*  HCT 35.0* 34.7* 33.4* 31.9*  MCV 86.0 88.5 88.4 88.1  PLT 209 195 188 536   Basic Metabolic Panel: Recent Labs  Lab 10/08/19 1150 10/09/19 0358 10/10/19 1028 10/11/19 0411  NA 135 134* 139 138  K 4.4 4.6 3.9 3.8  CL 99 98 97* 100  CO2 28 26 29 30   GLUCOSE 108* 152* 198* 156*  BUN 31* 33* 52* 55*  CREATININE 1.04 1.37* 1.25* 1.12  CALCIUM 9.1 8.8* 8.8* 8.5*  MG  --   2.2  --   --    GFR: Estimated Creatinine Clearance: 40 mL/min (by C-G formula based on SCr of 1.12 mg/dL). Liver Function Tests: Recent Labs  Lab 10/08/19 1150  AST 19  ALT 14  ALKPHOS 66  BILITOT 1.1  PROT 7.6  ALBUMIN 4.0   No results for input(s): LIPASE, AMYLASE in the last 168 hours. No results for input(s): AMMONIA in the last 168 hours.  Coagulation Profile: Recent Labs  Lab 10/08/19 1831 10/09/19 0358 10/10/19 0305 10/11/19 0411  INR 2.4* 2.5* 3.5* 4.0*   Cardiac Enzymes: No results for input(s): CKTOTAL, CKMB, CKMBINDEX, TROPONINI in the last 168 hours. BNP (last 3 results) No results for input(s): PROBNP in the last 8760 hours. HbA1C: No results for input(s): HGBA1C in the last 72 hours. CBG: No results for input(s): GLUCAP in the last 168 hours. Lipid Profile: No results for input(s): CHOL, HDL, LDLCALC, TRIG, CHOLHDL, LDLDIRECT in the last 72 hours. Thyroid Function Tests: No results for input(s): TSH, T4TOTAL, FREET4, T3FREE, THYROIDAB in the last 72 hours. Anemia Panel: No results for input(s): VITAMINB12, FOLATE, FERRITIN, TIBC, IRON, RETICCTPCT in the last 72 hours. Sepsis Labs: Recent Labs  Lab 10/08/19 1150 10/08/19 1416  PROCALCITON  --  <0.10  LATICACIDVEN 1.1 0.9    Recent Results (from the past 240 hour(s))  SARS Coronavirus 2 by RT PCR (hospital order, performed in [facility] hospital lab) Nasopharyngeal Nasopharyngeal Swab     Status: None   Collection Time: 10/08/19 11:50 AM   Specimen: Nasopharyngeal Swab  Result Value Ref Range Status   SARS Coronavirus 2 NEGATIVE NEGATIVE Final    Comment: (NOTE) SARS-CoV-2 target nucleic acids are NOT DETECTED.  The SARS-CoV-2 RNA is generally detectable in upper and lower respiratory specimens during the acute phase of infection. The lowest concentration of SARS-CoV-2 viral copies this assay can detect is 250 copies / mL. A negative result does not preclude SARS-CoV-2 infection and should  not be used as the sole basis for treatment or other patient management decisions.  A negative result may occur with improper specimen collection / handling, submission of specimen other than nasopharyngeal swab, presence of viral mutation(s) within the areas targeted by this assay, and inadequate number of viral copies (<250 copies / mL). A negative result must be combined with clinical observations, patient history, and epidemiological information.  Fact Sheet for Patients:   StrictlyIdeas.no  Fact Sheet for Healthcare Providers: BankingDealers.co.za  This test is not yet approved or  cleared by the [place] FDA and has been authorized for detection and/or diagnosis of SARS-CoV-2 by FDA under an Emergency Use Authorization (EUA).  This EUA will remain in effect (meaning this test can be used) for the duration of the COVID-19 declaration under Section 564(b)(1) of the Act, 21 U.S.C. section 360bbb-3(b)(1), unless the authorization is terminated or revoked sooner.  Performed at [facility], [address] [place], [place] [address]   CULTURE, BLOOD (ROUTINE X 2) w Reflex to ID Panel     Status: Abnormal (Preliminary result)   Collection Time: 10/08/19  3:45 PM   Specimen: BLOOD  Result Value Ref Range Status   Specimen Description   Final    BLOOD RIGHT ANTECUBITAL Performed at [facility], [address], [place], [place] [address]    Special Requests   Final    BOTTLES DRAWN AEROBIC AND ANAEROBIC Blood Culture adequate volume Performed at [facility], [address] [place], [place] [address]    Culture  Setup Time   Final    GRAM POSITIVE COCCI IN BOTH AEROBIC AND ANAEROBIC BOTTLES CRITICAL RESULT CALLED TO, READ BACK BY AND VERIFIED WITH: ABBY ELLINGTON AT 8338 10/09/19 SDR    Culture (A)  Final    STAPHYLOCOCCUS CAPITIS SUSCEPTIBILITIES TO FOLLOW Performed at [place], [place] [address] [place], [place] [address]    Report Status PENDING  Incomplete  CULTURE, BLOOD (ROUTINE X 2) w Reflex to ID Panel     Status: Abnormal (Preliminary result)   Collection Time: 10/08/19  3:45 PM   Specimen: BLOOD  Result Value Ref Range Status   Specimen Description   Final    BLOOD LEFT ANTECUBITAL Performed at [facility], [address], [place], [place] [address]    Special Requests   Final    BOTTLES DRAWN AEROBIC AND ANAEROBIC Blood Culture adequate volume Performed at [facility], [address], [place], [place] [address]    Culture  Setup Time   Final    GRAM POSITIVE COCCI IN BOTH AEROBIC AND ANAEROBIC BOTTLES CRITICAL VALUE NOTED.  VALUE IS CONSISTENT WITH PREVIOUSLY REPORTED AND CALLED VALUE. Performed at [facility], [address] [place], [place] [address]    Culture (A)  Final    STAPHYLOCOCCUS CAPITIS SUSCEPTIBILITIES TO FOLLOW Performed at [facility], [place] [address] [place], [place] [address]    Report Status PENDING  Incomplete  Blood Culture ID Panel (Reflexed)     Status: Abnormal   Collection Time: 10/08/19  3:45 PM  Result Value Ref Range Status   Enterococcus faecalis NOT DETECTED NOT DETECTED Final   Enterococcus Faecium NOT DETECTED NOT DETECTED Final   Listeria monocytogenes NOT DETECTED NOT DETECTED Final   Staphylococcus species DETECTED (A) NOT DETECTED Final    Comment: CRITICAL RESULT CALLED TO, READ BACK BY AND VERIFIED WITH:  ABBY ELLINGTON AT 0907 10/09/19 SDR    Staphylococcus aureus (BCID) NOT DETECTED NOT DETECTED Final   Staphylococcus epidermidis NOT DETECTED NOT DETECTED Final   Staphylococcus lugdunensis NOT DETECTED NOT DETECTED Final   Streptococcus species NOT DETECTED NOT DETECTED Final   Streptococcus agalactiae NOT DETECTED NOT DETECTED Final   Streptococcus pneumoniae NOT DETECTED NOT DETECTED Final   Streptococcus pyogenes NOT DETECTED NOT DETECTED Final    A.calcoaceticus-baumannii NOT DETECTED NOT DETECTED Final   Bacteroides fragilis NOT DETECTED NOT DETECTED Final   Enterobacterales NOT DETECTED NOT DETECTED Final   Enterobacter cloacae complex NOT DETECTED NOT DETECTED Final   Escherichia coli NOT DETECTED NOT DETECTED Final   Klebsiella aerogenes NOT DETECTED NOT DETECTED Final   Klebsiella oxytoca NOT DETECTED NOT DETECTED Final   Klebsiella pneumoniae NOT DETECTED NOT DETECTED Final   Proteus species NOT DETECTED NOT DETECTED Final   Salmonella species NOT DETECTED NOT DETECTED Final   Serratia marcescens NOT DETECTED NOT DETECTED Final   Haemophilus influenzae NOT DETECTED NOT DETECTED Final   Neisseria meningitidis NOT DETECTED NOT DETECTED Final   Pseudomonas aeruginosa NOT DETECTED NOT DETECTED Final   Stenotrophomonas maltophilia NOT DETECTED NOT DETECTED Final   Candida albicans NOT DETECTED NOT DETECTED Final   Candida auris NOT DETECTED NOT DETECTED Final   Candida glabrata NOT DETECTED NOT DETECTED Final   Candida krusei NOT DETECTED NOT DETECTED Final   Candida parapsilosis NOT DETECTED NOT DETECTED Final   Candida tropicalis NOT DETECTED NOT DETECTED Final   Cryptococcus neoformans/gattii NOT DETECTED NOT DETECTED Final    Comment: Performed at [facility], [address] [place], [place] [address]      Imaging Studies   ECHOCARDIOGRAM COMPLETE  Result Date: 10/10/2019    ECHOCARDIOGRAM REPORT   Patient Name:   FEDRICK CEFALU Date of Exam: 10/10/2019 Medical Rec #:  [id number]         Height:       70.0 in Accession #:    [id number]  Weight:       139.6 lb Date of Birth:  08-01-1928         BSA:          1.791 m Patient Age:    89 years          BP:           126/57 mmHg Patient Gender: M                 HR:           74 bpm. Exam Location:  [facility] Procedure: 2D Echo Indications:     CHF I50.31  History:         Patient has prior history of Echocardiogram examinations, most                  recent  01/06/2019.  Sonographer:     Arville Go RDCS Referring Phys:  Unknown Foley NIU Diagnosing Phys: Yolonda Kida MD IMPRESSIONS  1. Left ventricular ejection fraction, by estimation, is 55 to 60%. The left ventricle has normal function. The left ventricle has no regional wall motion abnormalities. Left ventricular diastolic parameters are consistent with Grade I diastolic dysfunction (impaired relaxation). There is the interventricular septum is flattened in diastole ('D' shaped left ventricle), consistent with right ventricular volume overload.  2. Right ventricular systolic function is moderately reduced. The right ventricular size is moderately enlarged. There is moderately elevated pulmonary artery systolic pressure.  3. Left atrial size was mildly dilated.  4. Right atrial size was severely dilated.  5. The mitral valve is abnormal. Mild mitral valve regurgitation.  6. Tricuspid valve regurgitation is moderate to severe.  7. The aortic valve is calcified. Aortic valve regurgitation is mild. Mild to moderate aortic valve sclerosis/calcification is present, without any evidence of aortic stenosis. FINDINGS  Left Ventricle: Left ventricular ejection fraction, by estimation, is 55 to 60%. The left ventricle has normal function. The left ventricle has no regional wall motion abnormalities. The left ventricular internal cavity size was normal in size. There is  no left ventricular hypertrophy. The interventricular septum is flattened in diastole ('D' shaped left ventricle), consistent with right ventricular volume overload. Left ventricular diastolic parameters are consistent with Grade I diastolic dysfunction  (impaired relaxation). Right Ventricle: The right ventricular size is moderately enlarged. No increase in right ventricular wall thickness. Right ventricular systolic function is moderately reduced. There is moderately elevated pulmonary artery systolic pressure. Left Atrium: Left atrial size was mildly  dilated. Right Atrium: Right atrial size was severely dilated. Pericardium: There is no evidence of pericardial effusion. Mitral Valve: The mitral valve is abnormal. There is mild thickening of the anterior and posterior mitral valve leaflet(s). There is moderate calcification of the anterior and posterior mitral valve leaflet(s). Mild mitral annular calcification. Mild mitral valve regurgitation. MV peak gradient, 7.6 mmHg. The mean mitral valve gradient is 3.0 mmHg. Tricuspid Valve: The tricuspid valve is normal in structure. Tricuspid valve regurgitation is moderate to severe. Aortic Valve: The aortic valve is calcified. Aortic valve regurgitation is mild. Aortic regurgitation PHT measures 540 msec. Mild to moderate aortic valve sclerosis/calcification is present, without any evidence of aortic stenosis. Aortic valve mean gradient measures 7.0 mmHg. Aortic valve peak gradient measures 15.5 mmHg. Aortic valve area, by VTI measures 1.44 cm. Pulmonic Valve: The pulmonic valve was normal in structure. Pulmonic valve regurgitation is not visualized. Aorta: The aortic root was not well visualized. IAS/Shunts: No atrial level shunt detected by color  flow Doppler.  LEFT VENTRICLE PLAX 2D LVIDd:         4.82 cm  Diastology LVIDs:         3.34 cm  LV e' medial:    6.53 cm/s LV PW:         1.13 cm  LV E/e' medial:  13.2 LV IVS:        1.15 cm  LV e' lateral:   11.10 cm/s LVOT diam:     1.90 cm  LV E/e' lateral: 7.8 LV SV:         54 LV SV Index:   30 LVOT Area:     2.84 cm  RIGHT VENTRICLE RV Basal diam:  3.81 cm RV S prime:     18.00 cm/s TAPSE (M-mode): 2.4 cm LEFT ATRIUM             Index       RIGHT ATRIUM           Index LA diam:        4.50 cm 2.51 cm/m  RA Area:     28.70 cm LA Vol (A2C):   99.2 ml 55.38 ml/m RA Volume:   110.00 ml 61.41 ml/m LA Vol (A4C):   59.5 ml 33.22 ml/m LA Biplane Vol: 81.0 ml 45.22 ml/m  AORTIC VALVE                    PULMONIC VALVE AV Area (Vmax):    1.26 cm     PV Vmax:       0.80  m/s AV Area (Vmean):   1.25 cm     PV Peak grad:  2.6 mmHg AV Area (VTI):     1.44 cm AV Vmax:           197.00 cm/s AV Vmean:          125.000 cm/s AV VTI:            0.379 m AV Peak Grad:      15.5 mmHg AV Mean Grad:      7.0 mmHg LVOT Vmax:         87.50 cm/s LVOT Vmean:        55.200 cm/s LVOT VTI:          0.192 m LVOT/AV VTI ratio: 0.51 AI PHT:            540 msec  AORTA Ao Root diam: 3.00 cm Ao Asc diam:  3.20 cm MITRAL VALVE               TRICUSPID VALVE MV Area (PHT): 4.68 cm    TV Peak grad:   46.6 mmHg MV Peak grad:  7.6 mmHg    TV Vmax:        3.42 m/s MV Mean grad:  3.0 mmHg MV Vmax:       1.38 m/s    SHUNTS MV Vmean:      73.1 cm/s   Systemic VTI:  0.19 m MV Decel Time: 162 msec    Systemic Diam: 1.90 cm MV E velocity: 86.50 cm/s Dwayne Prince Rome MD Electronically signed by Yolonda Kida MD Signature Date/Time: 10/10/2019/12:05:53 PM    Final      Medications   Scheduled Meds: . atorvastatin  20 mg Oral Daily  . dextromethorphan-guaiFENesin  1 tablet Oral BID  . diltiazem  300 mg Oral Daily  . ipratropium-albuterol  3 mL Nebulization TID  . lidocaine  1  patch Transdermal Q24H  . lisinopril  2.5 mg Oral Daily  . loratadine  10 mg Oral Daily  . mouth rinse  15 mL Mouth Rinse BID  . metoprolol tartrate  50 mg Oral BID  . predniSONE  40 mg Oral Q breakfast  . sodium chloride flush  3 mL Intravenous Q12H  . Vitamin D (Ergocalciferol)  50,000 Units Oral Q7 days  . Warfarin - Pharmacist Dosing Inpatient   Does not apply q1600   Continuous Infusions: . sodium chloride Stopped (10/10/19 1043)  . ampicillin-sulbactam (UNASYN) IV 3 g (10/11/19 1001)  . vancomycin 1,000 mg (10/11/19 1345)       LOS: 3 days    Time spent: 25 minutes with > 50% spent in coordinating care and direct patient contact    Ezekiel Slocumb, DO [facility]  10/11/2019, 3:48 PM    If 7PM-7AM, please contact night-coverage. How to contact the [facility] Attending or Consulting provider [place]  or covering provider during after hours [place], for this patient?    1. Check the care team in [facility] and look for a) attending/consulting [facility] provider listed and b) the [facility] team listed 2. Log into [email or web address] and use [place]'s universal password to access. If you do not have the password, please contact the hospital operator. 3. Locate the [facility] provider you are looking for under [facility] and page to a number that you can be directly reached. 4. If you still have difficulty reaching the provider, please page the [facility] (Director on Call) for the Hospitalists listed on amion for assistance.

## 2019-10-12 ENCOUNTER — Inpatient Hospital Stay: Payer: Medicare Other

## 2019-10-12 ENCOUNTER — Other Ambulatory Visit: Payer: Self-pay

## 2019-10-12 LAB — BASIC METABOLIC PANEL
Anion gap: 9 (ref 5–15)
BUN: 54 mg/dL — ABNORMAL HIGH (ref 8–23)
CO2: 30 mmol/L (ref 22–32)
Calcium: 8.9 mg/dL (ref 8.9–10.3)
Chloride: 102 mmol/L (ref 98–111)
Creatinine, Ser: 1.16 mg/dL (ref 0.61–1.24)
GFR calc Af Amer: >60 mL/min (ref >60)
GFR calc non Af Amer: 56 mL/min — ABNORMAL LOW (ref >60)
Glucose, Bld: 148 mg/dL — ABNORMAL HIGH (ref 70–99)
Potassium: 3.8 mmol/L (ref 3.5–5.1)
Sodium: 141 mmol/L (ref 135–145)

## 2019-10-12 LAB — CBC
HCT: 34.7 % — ABNORMAL LOW (ref 39.0–52.0)
Hemoglobin: 11.5 g/dL — ABNORMAL LOW (ref 13.0–17.0)
MCH: 29.1 pg (ref 26.0–34.0)
MCHC: 33.1 g/dL (ref 30.0–36.0)
MCV: 87.8 fL (ref 80.0–100.0)
Platelets: 192 10*3/uL (ref 150–400)
RBC: 3.95 MIL/uL — ABNORMAL LOW (ref 4.22–5.81)
RDW: 15.4 % (ref 11.5–15.5)
WBC: 11.2 10*3/uL — ABNORMAL HIGH (ref 4.0–10.5)
nRBC: 0 % (ref 0.0–0.2)

## 2019-10-12 LAB — PTH, INTACT AND CALCIUM
Calcium, Total (PTH): 8.6 mg/dL (ref 8.6–10.2)
PTH: 30 pg/mL (ref 15–65)

## 2019-10-12 LAB — CULTURE, BLOOD (ROUTINE X 2)
Special Requests: ADEQUATE
Special Requests: ADEQUATE

## 2019-10-12 LAB — PROTIME-INR
INR: 2.8 — ABNORMAL HIGH (ref 0.8–1.2)
Prothrombin Time: 28.5 seconds — ABNORMAL HIGH (ref 11.4–15.2)

## 2019-10-12 MED ORDER — CALCIUM CARBONATE 1250 (500 CA) MG PO TABS
1.0000 | ORAL_TABLET | Freq: Two times a day (BID) | ORAL | Status: DC
  Administered 2019-10-12 – 2019-10-14 (×5): 500 mg via ORAL
  Filled 2019-10-12 (×8): qty 1

## 2019-10-12 MED ORDER — PREDNISONE 20 MG PO TABS
30.0000 mg | ORAL_TABLET | Freq: Every day | ORAL | Status: DC
  Administered 2019-10-13: 30 mg via ORAL
  Filled 2019-10-12: qty 1

## 2019-10-12 MED ORDER — WARFARIN SODIUM 1 MG PO TABS
1.0000 mg | ORAL_TABLET | Freq: Once | ORAL | Status: AC
  Administered 2019-10-12: 1 mg via ORAL
  Filled 2019-10-12: qty 1

## 2019-10-12 NOTE — Progress Notes (Signed)
PROGRESS NOTE    [name]   QMG:[id number]  DOB: 1928-02-14  PCP: Baxter Hire, MD    DOA: 10/08/2019 LOS: 4   Brief Narrative   TION TSE is a 86 y.o. male with medical history of hypertension, hyperlipidemia, COPD, on 2 L/min oxygen at home, CHF, CAD, atrial fibrillation on Coumadin, COVID-19 infection, who presented to ED on 10/08/19 with progressive shortness of breath, wet sounding cough but without sputum production, mid-anterior chest wall pain, and O2 desats to low 80's on his usual 2 L/min.    No fever or chills.  Onset of his pain occurred about a week ago, acutely when he got up from seated to pull a chair towards himself to put his legs up, as he always does.  He reported feeling "something pulling from my tummy up to my chest".  Evaluation in the ED - BNP elevated 1004, troponin and lactate normal, leukocytosis WBC 12.8k, negative Covid-19 PCR.  BP uncontrolled 202/66 >> 176/84, afebrile, HR 99, tachypneic RR 28.   Imaging -  CXR Increasing rounded density in the left apex when compared with the prior exam, possibly evolving infiltrate/mass.  CT Chest -  1. ...partially calcified biapical pleuroparenchymal scarring, which is not significantly changed compared to prior examination dated 2017 and accounts for radiographic abnormality. No evidence of mass. 2. Bronchial plugging in the dependent left lower lobe. Minimal dependent bibasilar atelectasis and/or consolidation and trace pleural effusions. Findings suggest aspiration. 3. There is a buckled fracture deformity of the mid sternal body with overlying soft tissue edema, new compared to prior examination.  Correlate for recent injury an acute point tenderness.  4. There are multiple vertebral body wedge and endplate deformities, several which are new compared to prior examination dated 2017, particularly a high-grade wedge deformity of the T7 vertebral body, a more subtle superior endplate deformity of  the T5 vertebral body, and an increased, now high-grade wedge deformity of the L2 vertebral body. These are of uncertain acuity. Correlate for acute point tenderness. MRI may be used to assess for marrow edema and fracture acuity if desired.  5. Moderate to severe emphysema. 6. Coronary artery disease. 7. Aortic Atherosclerosis (ICD10-I70.0). Aortic valve calcifications. Enlargement of the proximal descending thoracic aorta, measuring up to 3.6 x 3.3 cm, slightly enlarged compared to prior examination at which time it measured 3.2 x 3.2 cm. Recommend annual imaging followup by CTA or MRA if clinically appropriate.      Assessment & Plan   Principal Problem:   Acute on chronic respiratory failure with hypoxia (HCC) Active Problems:   Hypertension   Coronary artery disease   HLD (hyperlipidemia)   Atrial fibrillation, chronic (HCC)   Acute on chronic diastolic CHF (congestive heart failure) (HCC)   COPD exacerbation (HCC)   Aspiration pneumonia (HCC)   Sepsis (HCC)   Acute on chronic diastolic (congestive) heart failure (HCC)   Acute on chronic respiratory failure with hypoxia - POA with progressive shortness of breath and increased oxygen requirement of 3-4 l/min from baseline 2 l/min.   Multifactorial likely aspiration pneumonia/pneumonitis, and some elements of both dCHF (edema, positive JVD, BNP 1004) and mild COPD exacerbation with wheezing. --supplemental O2, maintain O2 sat > 90% --further mgmt as below   Severe sepsis secondary to Aspiration pneumonia - sepsis present on admission as evidenced by tachycardia, tachypnea, leukocytosis, worsened hypoxia reflective of organ dysfunction.  Lactic acid normal.   --Continue Unasyn --D/C Vanc --Follow blood & sputum cultures --Prelim blood  culture growing staph capitis, methicillin sensitive.  Possibly contaminate but grew in both sets. --Repeat blood cultures ordered 9/13 - negative so far.   Patient denies having any implants or  hardware in his body.   --consider ID consult if concern for true bacteremia, but seems likely was contaminate   Acute on chronic diastolic CHF / Valvular heart disease -  Presented with worsened hypoxia, elevated BNP, JVD, edema, pleural effusions, shortness of breath, all consistent with CHF.   Echo in 12/2018 showed EF of 69-62%, grade 1 diastolic dysfunction, mild LVH.   Echo on 9/12: EF 55-60%, grade 1 dd, signs of RV volume overload, moderate PASP elevation, severely dilated RA, mod-severe TR, mild MR. Net IO Since Admission: -471.19 mL [10/12/19 1717] --Developed AKI with IV diuresis, has resolved --resumed home Lasix 20 mg daily  --strict I/O's, daily weights --monitor renal function and electrolytes --Low sodium diet, fluid restriction   Acute Kidney Injury - Not present on admission, has resolved.  On day 2, Cr bumped 1.04>>1.37, likely due to diuresis.   Monitor BMP daily.   COPD with mild acute exacerbation - presented with wheezing, worsened hypoxia, improved with bronchodilators, all clinically consistent with COPD.  Clinically improving. --Scheduled Duonebs q6h, PRN albuterol nebs --Schedule Mucinex --antibiotics as above, added Z-pack --Reduce Prednisone to 30 mg & taper off   Sternal body fracture - as shown by CT-chest on admission.  Given history provided of acute onset pain in chest/abdomen, tenderness on palpation, and several compression fractures, suspect fragility fracture. --Tylenol, Percocet PRN --Lidocaine patch --Monitor   Multiple Thoracic and Lumbar Compression Fractures - see imaging reports for details.  Involving T5, T7 (high grade), L2 (high grade).  Patient denies back pain and has no palpable midline tenderness along the entirety of his back.   MRI of T and L spine - T5 and T7 are acute to subacute, T3 and T4 subacute to chronic and more mild, L1 through L5 are chronic. --check vitamin D, calcium, PTH --likely fragility fractures, which is  diagnostic of osteoporosis --daughter did report pt had a bad fall directly onto his mid back, striking his back on a goose statue, several months ago earlier this year.    Osteoporosis - as evidenced by several compression fractures, sternal fracture without trauma.   Will need outpatient follow up for ongoing management.  Started on calcium supplement, vit D as below.  Vitamin D Deficiency - vitamin D level 16.8 on 9/12.   --Start vitamin D 50k units weekly.  Follow up outpatient.   Hypertension - chonric, uncontrolled on admission.  Continue Cardia, metoprolol, metoprolol, recently started on low dose lisinopril for CHF.    Coronary artery disease Hyperlipidemia --Continue Lipitor   Atrial fibrillation, chronic - currently rate controlled.  Continue metoprolol.  Warfarin per pharmacy.  Monitor INR.    Patient BMI: Body mass index is 20.02 kg/m.   DVT prophylaxis: on warfarin   Diet:  Diet Orders (From admission, onward)    Start     Ordered   10/08/19 1529  Diet Heart Room service appropriate? Yes; Fluid consistency: Thin; Fluid restriction: 1500 mL Fluid  Diet effective now       Question Answer Comment  Room service appropriate? Yes   Fluid consistency: Thin   Fluid restriction: 1500 mL Fluid      10/08/19 1530            Code Status: DNR    Subjective 10/12/19    Patient seen at  bedside this AM.  Reports he feels well.  Denies having any hardware or implants in his body.  Says sternal/chest pain is better with medication and patch.  No fever or chills.  Cough improving.   Disposition Plan & Communication   Status is: Inpatient  Inpatient status remains appropriate because of severity of illness, requiring IV therapies and further evaluation as above.  Awaiting culture sensitivities and repeat cultures neg x 48 hours.  Dispo: The patient is from: Home              Anticipated d/c is to: Home with [facility]              Anticipated d/c date is: 1 day               Patient currently is not medically stable for d/c.     Family Communication: none at bedside, will attempt to call daughter   Consults, Procedures, Significant Events   Consultants:   Pharmacy  Procedures:   None  Antimicrobials:   Unasyn   Vanco 9/12 >>  Z-pack 9/13 >> (5 days)   Objective   Vitals:   10/12/19 1104 10/12/19 1141 10/12/19 1317 10/12/19 1543  BP:  117/71  112/62  Pulse:  63  69  Resp:  17  16  Temp:  (!) 97.5 F (36.4 C)  98.1 F (36.7 C)  TempSrc:  Oral  Oral  SpO2: 90% 96% 98% 95%  Weight:      Height:        Intake/Output Summary (Last 24 hours) at 10/12/2019 1717 Last data filed at 10/12/2019 1330 Gross per 24 hour  Intake 1095.99 ml  Output 750 ml  Net 345.99 ml   Filed Weights   10/08/19 1657 10/09/19 0454 10/10/19 0529  Weight: 65.6 kg 62.5 kg 63.3 kg    Physical Exam:  General exam: awake, alert, no acute distress, frail, pleasant Respiratory system: poor aeration, scattered coarse wheezes, normal respiratory effort, on 2 L/min oxygen. Cardiovascular system: normal S1/S2, RRR, no peripheral edema.   Gastrointestinal system: soft, NT, ND, +bowel sounds Central nervous system: A&O x3. no gross focal neurologic deficits, normal speech    Labs   Data Reviewed: I have personally reviewed following labs and imaging studies  CBC: Recent Labs  Lab 10/08/19 1150 10/09/19 0358 10/10/19 0305 10/11/19 0411 10/12/19 0533  WBC 12.8* 13.0* 16.2* 12.8* 11.2*  NEUTROABS 8.6*  --   --   --   --   HGB 12.0* 11.6* 10.9* 10.7* 11.5*  HCT 35.0* 34.7* 33.4* 31.9* 34.7*  MCV 86.0 88.5 88.4 88.1 87.8  PLT 209 195 188 173 026   Basic Metabolic Panel: Recent Labs  Lab 10/08/19 1150 10/08/19 1150 10/09/19 0358 10/10/19 0305 10/10/19 1028 10/11/19 0411 10/12/19 0533  NA 135  --  134*  --  139 138 141  K 4.4  --  4.6  --  3.9 3.8 3.8  CL 99  --  98  --  97* 100 102  CO2 28  --  26  --  29 30 30   GLUCOSE 108*  --  152*  --   198* 156* 148*  BUN 31*  --  33*  --  52* 55* 54*  CREATININE 1.04  --  1.37*  --  1.25* 1.12 1.16  CALCIUM 9.1   < > 8.8* 8.6 8.8* 8.5* 8.9  MG  --   --  2.2  --   --   --   --    < > =  values in this interval not displayed.   GFR: Estimated Creatinine Clearance: 38.7 mL/min (by C-G formula based on SCr of 1.16 mg/dL). Liver Function Tests: Recent Labs  Lab 10/08/19 1150  AST 19  ALT 14  ALKPHOS 66  BILITOT 1.1  PROT 7.6  ALBUMIN 4.0   No results for input(s): LIPASE, AMYLASE in the last 168 hours. No results for input(s): AMMONIA in the last 168 hours. Coagulation Profile: Recent Labs  Lab 10/08/19 1831 10/09/19 0358 10/10/19 0305 10/11/19 0411 10/12/19 0533  INR 2.4* 2.5* 3.5* 4.0* 2.8*   Cardiac Enzymes: No results for input(s): CKTOTAL, CKMB, CKMBINDEX, TROPONINI in the last 168 hours. BNP (last 3 results) No results for input(s): PROBNP in the last 8760 hours. HbA1C: No results for input(s): HGBA1C in the last 72 hours. CBG: No results for input(s): GLUCAP in the last 168 hours. Lipid Profile: No results for input(s): CHOL, HDL, LDLCALC, TRIG, CHOLHDL, LDLDIRECT in the last 72 hours. Thyroid Function Tests: No results for input(s): TSH, T4TOTAL, FREET4, T3FREE, THYROIDAB in the last 72 hours. Anemia Panel: No results for input(s): VITAMINB12, FOLATE, FERRITIN, TIBC, IRON, RETICCTPCT in the last 72 hours. Sepsis Labs: Recent Labs  Lab 10/08/19 1150 10/08/19 1416  PROCALCITON  --  <0.10  LATICACIDVEN 1.1 0.9    Recent Results (from the past 240 hour(s))  SARS Coronavirus 2 by RT PCR (hospital order, performed in [facility] hospital lab) Nasopharyngeal Nasopharyngeal Swab     Status: None   Collection Time: 10/08/19 11:50 AM   Specimen: Nasopharyngeal Swab  Result Value Ref Range Status   SARS Coronavirus 2 NEGATIVE NEGATIVE Final    Comment: (NOTE) SARS-CoV-2 target nucleic acids are NOT DETECTED.  The SARS-CoV-2 RNA is generally detectable in  upper and lower respiratory specimens during the acute phase of infection. The lowest concentration of SARS-CoV-2 viral copies this assay can detect is 250 copies / mL. A negative result does not preclude SARS-CoV-2 infection and should not be used as the sole basis for treatment or other patient management decisions.  A negative result may occur with improper specimen collection / handling, submission of specimen other than nasopharyngeal swab, presence of viral mutation(s) within the areas targeted by this assay, and inadequate number of viral copies (<250 copies / mL). A negative result must be combined with clinical observations, patient history, and epidemiological information.  Fact Sheet for Patients:   StrictlyIdeas.no  Fact Sheet for Healthcare Providers: BankingDealers.co.za  This test is not yet approved or  cleared by the [place] FDA and has been authorized for detection and/or diagnosis of SARS-CoV-2 by FDA under an Emergency Use Authorization (EUA).  This EUA will remain in effect (meaning this test can be used) for the duration of the COVID-19 declaration under Section 564(b)(1) of the Act, 21 U.S.C. section 360bbb-3(b)(1), unless the authorization is terminated or revoked sooner.  Performed at [facility], [address] [place], [place] [address]   CULTURE, BLOOD (ROUTINE X 2) w Reflex to ID Panel     Status: Abnormal   Collection Time: 10/08/19  3:45 PM   Specimen: BLOOD  Result Value Ref Range Status   Specimen Description   Final    BLOOD RIGHT ANTECUBITAL Performed at [facility], [address] [place], [place] [address]    Special Requests   Final    BOTTLES DRAWN AEROBIC AND ANAEROBIC Blood Culture adequate volume Performed at [facility], [address], [place],  [address]  Culture  Setup Time   Final    GRAM POSITIVE COCCI IN BOTH AEROBIC AND ANAEROBIC  BOTTLES CRITICAL RESULT CALLED TO, READ BACK BY AND VERIFIED WITH: Dorena Bodo AT 9211 10/09/19 SDR Performed at [facility], [place] [address], [place], [place] [address]    Culture STAPHYLOCOCCUS CAPITIS (A)  Final   Report Status 10/12/2019 FINAL  Final   Organism ID, Bacteria STAPHYLOCOCCUS CAPITIS  Final      Susceptibility   Staphylococcus capitis - MIC*    CIPROFLOXACIN 4 RESISTANT Resistant     ERYTHROMYCIN <=0.25 SENSITIVE Sensitive     GENTAMICIN <=0.5 SENSITIVE Sensitive     OXACILLIN <=0.25 SENSITIVE Sensitive     TETRACYCLINE <=1 SENSITIVE Sensitive     VANCOMYCIN 1 SENSITIVE Sensitive     TRIMETH/SULFA <=10 SENSITIVE Sensitive     CLINDAMYCIN <=0.25 SENSITIVE Sensitive     RIFAMPIN <=0.5 SENSITIVE Sensitive     Inducible Clindamycin NEGATIVE Sensitive     * STAPHYLOCOCCUS CAPITIS  CULTURE, BLOOD (ROUTINE X 2) w Reflex to ID Panel     Status: Abnormal   Collection Time: 10/08/19  3:45 PM   Specimen: BLOOD  Result Value Ref Range Status   Specimen Description   Final    BLOOD LEFT ANTECUBITAL Performed at [facility], [address] [place], [place] [address]    Special Requests   Final    BOTTLES DRAWN AEROBIC AND ANAEROBIC Blood Culture adequate volume Performed at [facility], [address] [place], [place] [address]    Culture  Setup Time   Final    GRAM POSITIVE COCCI IN BOTH AEROBIC AND ANAEROBIC BOTTLES CRITICAL VALUE NOTED.  VALUE IS CONSISTENT WITH PREVIOUSLY REPORTED AND CALLED VALUE. Performed at [facility] [address] [place], [place] [address]    Culture (A)  Final    STAPHYLOCOCCUS CAPITIS SUSCEPTIBILITIES PERFORMED ON PREVIOUS CULTURE WITHIN THE LAST 5 DAYS. Performed at [facility], [place] [address] [place], [place] [address]    Report Status 10/12/2019 FINAL  Final  Blood Culture ID Panel (Reflexed)     Status: Abnormal   Collection Time: 10/08/19  3:45 PM  Result Value Ref Range Status    Enterococcus faecalis NOT DETECTED NOT DETECTED Final   Enterococcus Faecium NOT DETECTED NOT DETECTED Final   Listeria monocytogenes NOT DETECTED NOT DETECTED Final   Staphylococcus species DETECTED (A) NOT DETECTED Final    Comment: CRITICAL RESULT CALLED TO, READ BACK BY AND VERIFIED WITH:  ABBY ELLINGTON AT 0907 10/09/19 SDR    Staphylococcus aureus (BCID) NOT DETECTED NOT DETECTED Final   Staphylococcus epidermidis NOT DETECTED NOT DETECTED Final   Staphylococcus lugdunensis NOT DETECTED NOT DETECTED Final   Streptococcus species NOT DETECTED NOT DETECTED Final   Streptococcus agalactiae NOT DETECTED NOT DETECTED Final   Streptococcus pneumoniae NOT DETECTED NOT DETECTED Final   Streptococcus pyogenes NOT DETECTED NOT DETECTED Final   A.calcoaceticus-baumannii NOT DETECTED NOT DETECTED Final   Bacteroides fragilis NOT DETECTED NOT DETECTED Final   Enterobacterales NOT DETECTED NOT DETECTED Final   Enterobacter cloacae complex NOT DETECTED NOT DETECTED Final   Escherichia coli NOT DETECTED NOT DETECTED Final   Klebsiella aerogenes NOT DETECTED NOT DETECTED Final   Klebsiella oxytoca NOT DETECTED NOT DETECTED Final   Klebsiella pneumoniae NOT DETECTED NOT DETECTED Final   Proteus species NOT DETECTED NOT DETECTED Final   Salmonella species NOT DETECTED NOT DETECTED Final   Serratia marcescens NOT DETECTED  NOT DETECTED Final   Haemophilus influenzae NOT DETECTED NOT DETECTED Final   Neisseria meningitidis NOT DETECTED NOT DETECTED Final   Pseudomonas aeruginosa NOT DETECTED NOT DETECTED Final   Stenotrophomonas maltophilia NOT DETECTED NOT DETECTED Final   Candida albicans NOT DETECTED NOT DETECTED Final   Candida auris NOT DETECTED NOT DETECTED Final   Candida glabrata NOT DETECTED NOT DETECTED Final   Candida krusei NOT DETECTED NOT DETECTED Final   Candida parapsilosis NOT DETECTED NOT DETECTED Final   Candida tropicalis NOT DETECTED NOT DETECTED Final   Cryptococcus  neoformans/gattii NOT DETECTED NOT DETECTED Final    Comment: Performed at [facility], [address] [place], [place] [address]  CULTURE, BLOOD (ROUTINE X 2) w Reflex to ID Panel     Status: None (Preliminary result)   Collection Time: 10/11/19  2:23 PM   Specimen: BLOOD  Result Value Ref Range Status   Specimen Description BLOOD BLOOD RIGHT FOREARM  Final   Special Requests   Final    BOTTLES DRAWN AEROBIC AND ANAEROBIC Blood Culture adequate volume   Culture   Final    NO GROWTH < 24 HOURS Performed at [facility], [address] [place], [place] [address]    Report Status PENDING  Incomplete  CULTURE, BLOOD (ROUTINE X 2) w Reflex to ID Panel     Status: None (Preliminary result)   Collection Time: 10/11/19  2:29 PM   Specimen: BLOOD  Result Value Ref Range Status   Specimen Description BLOOD BLOOD RIGHT HAND  Final   Special Requests   Final    BOTTLES DRAWN AEROBIC AND ANAEROBIC Blood Culture adequate volume   Culture   Final    NO GROWTH < 24 HOURS Performed at [facility], [address], [place], [place] [address]    Report Status PENDING  Incomplete      Imaging Studies   DG Chest Port 1 View  Result Date: 10/12/2019 CLINICAL DATA:  Acute on chronic respiratory failure EXAM: PORTABLE CHEST 1 VIEW COMPARISON:  10/08/2019, 01/24/2019, 02/01/2014 CT 10/08/2019 FINDINGS: Hyperinflation with emphysematous disease. Biapical pleural and parenchymal scarring. Cardiomegaly with enlarged central pulmonary vessels. Suspected trace left pleural effusion. Streaky atelectasis at the left base. Aortic atherosclerosis. No pneumothorax. IMPRESSION: 1. Cardiomegaly with small left pleural effusion and streaky atelectasis at the left base. 2. Emphysematous disease. 3. Enlarged appearing central pulmonary vessels suggesting arterial hypertension. Electronically Signed   By: Donavan Foil M.D.   On: 10/12/2019 02:17     Medications   Scheduled Meds: .  atorvastatin  20 mg Oral Daily  . azithromycin  250 mg Oral Daily  . dextromethorphan-guaiFENesin  1 tablet Oral BID  . diltiazem  300 mg Oral Daily  . furosemide  20 mg Oral Daily  . ipratropium-albuterol  3 mL Nebulization TID  . lidocaine  1 patch Transdermal Q24H  . lisinopril  2.5 mg Oral Daily  . loratadine  10 mg Oral Daily  . mouth rinse  15 mL Mouth Rinse BID  . metoprolol tartrate  50 mg Oral BID  . predniSONE  40 mg Oral Q breakfast  . sodium chloride flush  3 mL Intravenous Q12H  . Vitamin D (Ergocalciferol)  50,000 Units Oral Q7 days  . Warfarin - Pharmacist Dosing Inpatient   Does not apply q1600   Continuous Infusions: . sodium chloride Stopped (10/10/19 1043)  . ampicillin-sulbactam (UNASYN) IV 3 g (10/12/19 1505)       LOS: 4 days  Time spent: 25 minutes with > 50% spent in coordinating care and direct patient contact    Ezekiel Slocumb, DO [facility]  10/12/2019, 5:17 PM    If 7PM-7AM, please contact night-coverage. How to contact the [facility] Attending or Consulting provider [place] or covering provider during after hours [place], for this patient?    1. Check the care team in [facility] and look for a) attending/consulting [facility] provider listed and b) the [facility] team listed 2. Log into [email or web address] and use [place]'s universal password to access. If you do not have the password, please contact the hospital operator. 3. Locate the [facility] provider you are looking for under [facility] and page to a number that you can be directly reached. 4. If you still have difficulty reaching the provider, please page the [facility] (Director on Call) for the Hospitalists listed on amion for assistance.

## 2019-10-12 NOTE — Progress Notes (Signed)
Physical Therapy Treatment Patient Details Name: [name] MRN: [id number] DOB: [birth year] Today's Date: 10/12/2019    History of Present Illness [name] is an 86 y/o male who was admitted for acute on chronic respiratory failure with hypoxia. Pt arrived via EMS with complaints of progressive SOB and dry cough. PMH includes HTN, HLD, COPD, on 2L O2 at baseline at home, CHF, CAD, A-Fib on coumadin, and Covid-19 infection.    PT Comments    Pt up to chair at entry, NAD, 2L/min maintained via [place]. Pt agreeable to session. Supervision for STS transfers c RW, high level effort. Pt AMB >442ft slowly (but appropriate for limited community distance AMB) on 2L/min without SOB, lowest SpO2 assessed 90%. Pt in chair at end of session.      Follow Up Recommendations  Home health PT     Equipment Recommendations  None recommended by PT    Recommendations for Other Services       Precautions / Restrictions Precautions Precautions: Fall Restrictions Weight Bearing Restrictions: No    Mobility  Bed Mobility               General bed mobility comments: up to chair at entry  Transfers Overall transfer level: Needs assistance Equipment used: Rolling walker (2 wheeled) Transfers: Sit to/from Stand Sit to Stand: Supervision            Ambulation/Gait Ambulation/Gait assistance: Min guard Gait Distance (Feet): 450 Feet Assistive device: Rolling walker (2 wheeled) Gait Pattern/deviations: WFL(Within Functional Limits);Step-through pattern Gait velocity: 0.41m/s; 2L/min maintained, constant pacing without frank dyspnea.   General Gait Details: no cues needed to RW management   Stairs             Wheelchair Mobility    Modified Rankin (Stroke Patients Only)       Balance                                            Cognition Arousal/Alertness: Awake/alert Behavior During Therapy: WFL for tasks assessed/performed Overall  Cognitive Status: Within Functional Limits for tasks assessed                                 General Comments: Pt A&O x 4.      Exercises      General Comments        Pertinent Vitals/Pain Pain Assessment: No/denies pain    Home Living                      Prior Function            PT Goals (current goals can now be found in the care plan section) Acute Rehab PT Goals Patient Stated Goal: to go home PT Goal Formulation: With patient Time For Goal Achievement: 10/23/19 Progress towards PT goals: Progressing toward goals    Frequency    Min 2X/week      PT Plan Current plan remains appropriate    Co-evaluation              AM-PAC PT "6 Clicks" Mobility   Outcome Measure  Help needed turning from your back to your side while in a flat bed without using bedrails?: None Help needed moving from lying on your back to sitting on the side of  a flat bed without using bedrails?: A Little Help needed moving to and from a bed to a chair (including a wheelchair)?: A Little Help needed standing up from a chair using your arms (e.g., wheelchair or bedside chair)?: A Little Help needed to walk in hospital room?: A Little Help needed climbing 3-5 steps with a railing? : A Little 6 Click Score: 19    End of Session Equipment Utilized During Treatment: Gait belt;Oxygen Activity Tolerance: Patient limited by fatigue;Patient tolerated treatment well Patient left: in chair;with call bell/phone within reach;with chair alarm set Nurse Communication: Mobility status PT Visit Diagnosis: Unsteadiness on feet (R26.81);Muscle weakness (generalized) (M62.81)     Time: 6381-7711 PT Time Calculation (min) (ACUTE ONLY): 23 min  Charges:  $Therapeutic Exercise: 23-37 mins                     4:25 PM, 10/12/19 Etta Grandchild, PT, DPT Physical Therapist - [facility]  [phone] ([place])    [place]  C 10/12/2019, 4:24 PM

## 2019-10-12 NOTE — Consult Note (Signed)
ANTICOAGULATION CONSULT NOTE - Initial Consult  Pharmacy Consult for warfarin Indication: atrial fibrillation  No Known Allergies  Patient Measurements: Height: 5\' 10"  (177.8 cm) Weight: 63.3 kg (139 lb 8.8 oz) IBW/kg (Calculated) : 73  Vital Signs: Temp: 97.5 F (36.4 C) (09/14 0724) Temp Source: Oral (09/14 0724) BP: 167/70 (09/14 0724) Pulse Rate: 63 (09/14 0724)  Labs: Recent Labs    10/10/19 0305 10/10/19 0305 10/10/19 1028 10/11/19 0411 10/12/19 0533  HGB 10.9*   < >  --  10.7* 11.5*  HCT 33.4*  --   --  31.9* 34.7*  PLT 188  --   --  173 192  LABPROT 33.8*  --   --  38.1* 28.5*  INR 3.5*  --   --  4.0* 2.8*  CREATININE  --   --  1.25* 1.12 1.16   < > = values in this interval not displayed.    Estimated Creatinine Clearance: 38.7 mL/min (by C-G formula based on SCr of 1.16 mg/dL).   Medical History: Past Medical History:  Diagnosis Date  . CHF (congestive heart failure) ([place])   . COPD (chronic obstructive pulmonary disease) ([place])   . Coronary artery disease   . Hypertension     Medications:  Medications Prior to Admission  Medication Sig Dispense Refill Last Dose  . albuterol (PROVENTIL) (2.5 MG/3ML) 0.083% nebulizer solution Take 3 mLs (2.5 mg total) by nebulization every 6 (six) hours as needed for wheezing or shortness of breath. 75 mL 0 PRN at PRN  . albuterol (VENTOLIN HFA) 108 (90 Base) MCG/ACT inhaler Inhale into the lungs every 6 (six) hours as needed for wheezing or shortness of breath.   PRN at PRN  . atorvastatin (LIPITOR) 20 MG tablet Take 20 mg by mouth daily.   10/07/2019 at Unknown time  . CARTIA XT 300 MG 24 hr capsule Take 300 mg by mouth daily.   10/07/2019 at Unknown time  . fexofenadine (ALLEGRA) 180 MG tablet Take 180 mg by mouth daily.   PRN at PRN  . Fluticasone-Umeclidin-Vilant (TRELEGY ELLIPTA) 100-62.5-25 MCG/INH AEPB Inhale 1 puff into the lungs daily.    10/07/2019 at Unknown time  . furosemide (LASIX) 20 MG tablet TAKE 1 TABLET BY  MOUTH DAILY 30 tablet 5 10/07/2019 at Unknown time  . metoprolol tartrate (LOPRESSOR) 50 MG tablet Take 50 mg by mouth 2 (two) times daily.   10/07/2019 at Unknown time  . mometasone (NASONEX) 50 MCG/ACT nasal spray Place 1 spray into the nose daily. Each nostril   Past Week at PRN  . potassium chloride (KLOR-CON) 10 MEQ tablet Take 10 mEq by mouth daily.   10/07/2019 at Unknown time  . predniSONE (DELTASONE) 10 MG tablet Take 5 mg by mouth daily with breakfast.   10/07/2019 at Unknown time  . traZODone (DESYREL) 50 MG tablet Take 50 mg by mouth at bedtime as needed for sleep.    Past Month at PRN  . warfarin (COUMADIN) 2 MG tablet Take 2 mg by mouth daily.    10/07/2019 at 1800  . warfarin (COUMADIN) 1 MG tablet Take 1 mg by mouth daily.   10/04/2019    Assessment: 86 year old male presenting with cough, chest pain, and shortness of breath. Pt admitted for acute on chronic respiratory failure. CT showing possible aspiration pneumonia. PMH includes atrial fibrillation, CHF, COPD, CAD, and HTN. Pharmacy has been consulted to restart home warfarin. Pt was taking warfarin 2 mg daily prior to admission.   Hgb  stable between 10-11 Plt wnl, however trending down  Goal of Therapy:  INR 2-3 Monitor platelets by anticoagulation protocol: Yes   Date INR Dose  9/10 2.4 2 mg  9/11 2.5 2 mg  9/12 3.5 Held  9/13 4.0 Held  9/14 2.8        Elevated INR likely d/t interaction with Unasyn (started 9/10) INR appropriately trending down after holding warfarin x2 days. INR 2.8 therapeutic today (9/14).   Plan:  Will give warfarin 1mg  tonight since INR is at the higher end of INR goal  Continue to monitor for drug interactions with warfarin Re-check INR and CBC daily with AM labs  Sherilyn Banker, PharmD Pharmacy Resident  10/12/2019 7:33 AM

## 2019-10-12 NOTE — Progress Notes (Addendum)
   Heart Failure Nurse Navigator Note   HFpEF 88-75%, Grade I Diastolic Dysfunction  He had presented to the hospital with worsening SOB,lower extremity edema.  On exam in the ED found to have JVD, CXR revealed bilateral pleural effusions.  Comorbidities:  Persistent A fib Coronary artery disease Hyperlipidemia Hypertension COPD  Medications:  Lipitor 20 mg daily Cardizem CD 300 mg daily Lisinopril 2.5 mg daily metoprolol tartrate  50 mg daily  Restarted lasix 20 mg by mouth daily   Labs:  Sodium 141, potassium 3.8,BUN 54, creatinine 1.16 Inr 2.8.  Patient had not been weighed for 2 days.  Intake- 1096 ml Output-1085 ml  He denies any SOB, chest pain, lower extremity edema or cough.  He is back to his baseline of home 02 use at 2 liters/nasal cannula.  Today discussed signs and symptoms to report to doctor or [name] NP in heart failure clinic.  By teach back method patient was able explain what he would report.  He weighs himself daily and records in a notebook that he has in his bathroom.  He states he does not feel 100% like himself but when questioned what is keeping him from being 100%, he states it because he is not home. States that if his blood work levels improve  (INR)will get to home tomorrow.   Pricilla Riffle RN, CHFN

## 2019-10-12 NOTE — Progress Notes (Signed)
Occupational Therapy Treatment Patient Details Name: [name] MRN: [id number] DOB: Jan 05, 1929 Today's Date: 10/12/2019    History of present illness [name] is an 86 y/o male who was admitted for acute on chronic respiratory failure with hypoxia. Pt arrived via EMS with complaints of progressive SOB and dry cough. PMH includes HTN, HLD, COPD, on 2L O2 at baseline at home, CHF, CAD, A-Fib on coumadin, and Covid-19 infection.   OT comments  Pt lying in bed, agreeable to OT treatment. Pt A&O x 4, reports 03/31/08 pain in his chest/abdomen. States that nurse had recently removed his "pain patch" -- prior to that, his pain was 0. Pt came to EOB sit independently, with mild UE use of bedrails to come into sitting. Pt reported that his O2 level had been 100% this AM, and that nurse had therefore turned his [place] O2 down to 2L. Upon sitting, pt became mildly SOB, and O2 recorded at 85%. Pt performed grooming and UB bathing tasks from bedside, with SUPVN, following set up. Pt able to reach arms overhead (for hair care mgmt) and reach outside BOS w/o LOB. Pt moved from sit to stand with SUPVN and RW, able to achieve full upright stance quickly and without assistance. Pt engaged in therex with VCs, using RW, no LOW, CGA. Pt instructed in EC strategies, fall prevention, importance of daily mobility, home exercise. Pt verbalized understanding. Pt left seated in recliner with call bell within reach and chair alarm activated. Nurse notified re: declining O2 sats. Pt making good progress toward goals, can continue to benefit from skilled OT services to assist pt's return to PLOF. Pt has lived alone since 01-11-2019, when his wife passed away. HHOT could be useful to maximize safety and mobility at home: discharge recommendation remains appropriate.    Follow Up Recommendations  Home health OT    Equipment Recommendations  None recommended by OT    Recommendations for Other Services      Precautions /  Restrictions Precautions Precautions: Fall Restrictions Weight Bearing Restrictions: No       Mobility Bed Mobility Overal bed mobility: Independent                Transfers   Equipment used: Rolling walker (2 wheeled) Transfers: Sit to/from Stand Sit to Stand: Min guard         General transfer comment: SBA EOB<>recliner    Balance Overall balance assessment: Needs assistance Sitting-balance support: Feet unsupported Sitting balance-Leahy Scale: Good Sitting balance - Comments: good seated balance while reaching outside of BOS; raising arms overhead   Standing balance support: No upper extremity supported;During functional activity Standing balance-Leahy Scale: Good                             ADL either performed or assessed with clinical judgement   ADL Overall ADL's : Needs assistance/impaired     Grooming: Supervision/safety;Set up Grooming Details (indicate cue type and reason): Pt engaged in grooming activities seated EOB, with Supervision Upper Body Bathing: Supervision/ safety;Set up Upper Body Bathing Details (indicate cue type and reason): Pt engaged in UB bathing activities seated EOB, with Supervision                         Functional mobility during ADLs: Supervision/safety General ADL Comments: Peformed grooming/bathing tasks seated EOB (as opposed to standing) given slight SOB.     Vision Baseline Vision/History:  No visual deficits Patient Visual Report: No change from baseline     Perception     Praxis      Cognition Arousal/Alertness: Awake/alert Behavior During Therapy: WFL for tasks assessed/performed Overall Cognitive Status: Within Functional Limits for tasks assessed                                 General Comments: Pt A&O x 4.        Exercises Other Exercises Other Exercises: Pt educated re: OT role, d/c recs, falls prevention, energy conservation, importance of daily movement    Shoulder Instructions       General Comments Skin extremeley dry    Pertinent Vitals/ Pain       Pain Assessment: 0-10 Pain Score: 4  Pain Location: in chest/abdomen Pain Intervention(s): Monitored during session;Repositioned  Home Living Family/patient expects to be discharged to:: Private residence Living Arrangements: Alone Available Help at Discharge: Family;Available PRN/intermittently Type of Home: House Home Access: Ramped entrance     Home Layout: Multi-level     Bathroom Shower/Tub: Walk-in shower   Bathroom Toilet: Handicapped height     Home Equipment: Environmental consultant - 2 wheels;Cane - single point;Grab bars - toilet   Additional Comments: Uses SPC at home      Prior Functioning/Environment Level of Independence: Independent with assistive device(s)        Comments: Pt reports ambulating with use of SPC for most of the time. DTR calls him 2x/day and can work from his house a few hours per day. Reports a friend sponge bathes him 1x/week. Prepares his own meals.   Frequency  Min 1X/week        Progress Toward Goals  OT Goals(current goals can now be found in the care plan section)     Acute Rehab OT Goals Patient Stated Goal: to go home Time For Goal Achievement: 10/23/19 Potential to Achieve Goals: Good  Plan      Co-evaluation                 AM-PAC OT "6 Clicks" Daily Activity     Outcome Measure   Help from another person eating meals?: None Help from another person taking care of personal grooming?: A Little Help from another person toileting, which includes using toliet, bedpan, or urinal?: A Little Help from another person bathing (including washing, rinsing, drying)?: A Little Help from another person to put on and taking off regular upper body clothing?: None Help from another person to put on and taking off regular lower body clothing?: A Little 6 Click Score: 20    End of Session Equipment Utilized During Treatment:  Oxygen;Rolling walker  OT Visit Diagnosis: Other abnormalities of gait and mobility (R26.89)   Activity Tolerance Patient tolerated treatment well   Patient Left in chair;with call bell/phone within reach;with chair alarm set   Nurse Communication Other (comment) (drop in O2 sats)        Time: 1020-1101 OT Time Calculation (min): 41 min  Charges: OT General Charges $OT Visit: 1 Visit OT Treatments $Self Care/Home Management : 8-22 mins $Therapeutic Activity: 8-22 mins $Therapeutic Exercise: 8-22 mins  Josiah Lobo, PhD, MS, OTR/L ascom [phone] 10/12/19, 11:18 AM

## 2019-10-13 LAB — PROTIME-INR
INR: 2.5 — ABNORMAL HIGH (ref 0.8–1.2)
Prothrombin Time: 25.9 seconds — ABNORMAL HIGH (ref 11.4–15.2)

## 2019-10-13 LAB — CBC
HCT: 35.8 % — ABNORMAL LOW (ref 39.0–52.0)
Hemoglobin: 11.7 g/dL — ABNORMAL LOW (ref 13.0–17.0)
MCH: 29.4 pg (ref 26.0–34.0)
MCHC: 32.7 g/dL (ref 30.0–36.0)
MCV: 89.9 fL (ref 80.0–100.0)
Platelets: 208 10*3/uL (ref 150–400)
RBC: 3.98 MIL/uL — ABNORMAL LOW (ref 4.22–5.81)
RDW: 15.3 % (ref 11.5–15.5)
WBC: 12.5 10*3/uL — ABNORMAL HIGH (ref 4.0–10.5)
nRBC: 0 % (ref 0.0–0.2)

## 2019-10-13 LAB — BASIC METABOLIC PANEL
Anion gap: 9 (ref 5–15)
BUN: 46 mg/dL — ABNORMAL HIGH (ref 8–23)
CO2: 32 mmol/L (ref 22–32)
Calcium: 9.2 mg/dL (ref 8.9–10.3)
Chloride: 99 mmol/L (ref 98–111)
Creatinine, Ser: 0.98 mg/dL (ref 0.61–1.24)
GFR calc Af Amer: >60 mL/min (ref >60)
GFR calc non Af Amer: >60 mL/min (ref >60)
Glucose, Bld: 98 mg/dL (ref 70–99)
Potassium: 3.9 mmol/L (ref 3.5–5.1)
Sodium: 140 mmol/L (ref 135–145)

## 2019-10-13 LAB — MAGNESIUM: Magnesium: 2.3 mg/dL (ref 1.7–2.4)

## 2019-10-13 MED ORDER — PREDNISONE 20 MG PO TABS
20.0000 mg | ORAL_TABLET | Freq: Every day | ORAL | Status: AC
  Administered 2019-10-14: 20 mg via ORAL
  Filled 2019-10-13: qty 1

## 2019-10-13 MED ORDER — WARFARIN SODIUM 1 MG PO TABS
1.0000 mg | ORAL_TABLET | Freq: Once | ORAL | Status: AC
  Administered 2019-10-13: 1 mg via ORAL
  Filled 2019-10-13: qty 1

## 2019-10-13 MED ORDER — AMOXICILLIN-POT CLAVULANATE 875-125 MG PO TABS
1.0000 | ORAL_TABLET | Freq: Two times a day (BID) | ORAL | Status: DC
  Administered 2019-10-13 – 2019-10-14 (×3): 1 via ORAL
  Filled 2019-10-13 (×3): qty 1

## 2019-10-13 MED ORDER — PREDNISONE 10 MG PO TABS: 10.0000 mg | ORAL_TABLET | Freq: Every day | ORAL | Status: DC

## 2019-10-13 NOTE — Consult Note (Signed)
ANTICOAGULATION CONSULT NOTE - Initial Consult  Pharmacy Consult for warfarin Indication: atrial fibrillation  No Known Allergies  Patient Measurements: Height: 5\' 10"  (177.8 cm) Weight: 67 kg (147 lb 12.8 oz) IBW/kg (Calculated) : 73  Vital Signs: Temp: 99.4 F (37.4 C) (09/15 0505) Temp Source: Oral (09/15 0505) BP: 158/75 (09/15 0505) Pulse Rate: 98 (09/15 0505)  Labs: Recent Labs    10/11/19 0411 10/11/19 0411 10/12/19 0533 10/13/19 0526  HGB 10.7*   < > 11.5* 11.7*  HCT 31.9*  --  34.7* 35.8*  PLT 173  --  192 208  LABPROT 38.1*  --  28.5* 25.9*  INR 4.0*  --  2.8* 2.5*  CREATININE 1.12  --  1.16 0.98   < > = values in this interval not displayed.    Estimated Creatinine Clearance: 48.4 mL/min (by C-G formula based on SCr of 0.98 mg/dL).   Medical History: Past Medical History:  Diagnosis Date  . CHF (congestive heart failure) ([place])   . COPD (chronic obstructive pulmonary disease) ([place])   . Coronary artery disease   . Hypertension     Medications:  Medications Prior to Admission  Medication Sig Dispense Refill Last Dose  . albuterol (PROVENTIL) (2.5 MG/3ML) 0.083% nebulizer solution Take 3 mLs (2.5 mg total) by nebulization every 6 (six) hours as needed for wheezing or shortness of breath. 75 mL 0 PRN at PRN  . albuterol (VENTOLIN HFA) 108 (90 Base) MCG/ACT inhaler Inhale into the lungs every 6 (six) hours as needed for wheezing or shortness of breath.   PRN at PRN  . atorvastatin (LIPITOR) 20 MG tablet Take 20 mg by mouth daily.   10/07/2019 at Unknown time  . CARTIA XT 300 MG 24 hr capsule Take 300 mg by mouth daily.   10/07/2019 at Unknown time  . fexofenadine (ALLEGRA) 180 MG tablet Take 180 mg by mouth daily.   PRN at PRN  . Fluticasone-Umeclidin-Vilant (TRELEGY ELLIPTA) 100-62.5-25 MCG/INH AEPB Inhale 1 puff into the lungs daily.    10/07/2019 at Unknown time  . furosemide (LASIX) 20 MG tablet TAKE 1 TABLET BY MOUTH DAILY 30 tablet 5 10/07/2019 at Unknown  time  . metoprolol tartrate (LOPRESSOR) 50 MG tablet Take 50 mg by mouth 2 (two) times daily.   10/07/2019 at Unknown time  . mometasone (NASONEX) 50 MCG/ACT nasal spray Place 1 spray into the nose daily. Each nostril   Past Week at PRN  . potassium chloride (KLOR-CON) 10 MEQ tablet Take 10 mEq by mouth daily.   10/07/2019 at Unknown time  . predniSONE (DELTASONE) 10 MG tablet Take 5 mg by mouth daily with breakfast.   10/07/2019 at Unknown time  . traZODone (DESYREL) 50 MG tablet Take 50 mg by mouth at bedtime as needed for sleep.    Past Month at PRN  . warfarin (COUMADIN) 2 MG tablet Take 2 mg by mouth daily.    10/07/2019 at 1800  . warfarin (COUMADIN) 1 MG tablet Take 1 mg by mouth daily.   10/04/2019    Assessment: 86 year old male presenting with cough, chest pain, and shortness of breath. Pt admitted for acute on chronic respiratory failure. CT showing possible aspiration pneumonia. PMH includes atrial fibrillation, CHF, COPD, CAD, and HTN. Pharmacy has been consulted to restart home warfarin. Pt was taking warfarin 2 mg daily prior to admission.   Hgb stable between 10-11 Plt WNL  Goal of Therapy:  INR 2-3 Monitor platelets by anticoagulation protocol: Yes  Date INR Dose  9/10 2.4 2 mg  9/11 2.5 2 mg  9/12 3.5 Held  9/13 4.0 Held  9/14 2.8 1 mg  9/15 2.5    Elevated INR likely d/t interaction with Unasyn (started 9/10). Pt is also taking azithromycin (9/14-9/18) and prednisone which may also effect INR. INR appropriately trending down after holding warfarin x2 days. INR 2.5 therapeutic today (9/15).   Plan:  Will give warfarin 1mg  tonight  Continue to monitor for drug interactions with warfarin Re-check INR and CBC daily with AM labs  Sherilyn Banker, PharmD Pharmacy Resident  10/13/2019 7:17 AM

## 2019-10-13 NOTE — Progress Notes (Signed)
Occupational Therapy Treatment Patient Details Name: [name] MRN: [id number] DOB: 1928/11/07 Today's Date: 10/13/2019    History of present illness Moussa Wiegand is an 86 y/o male who was admitted for acute on chronic respiratory failure with hypoxia. Pt arrived via EMS with complaints of progressive SOB and dry cough. PMH includes HTN, HLD, COPD, on 2L O2 at baseline at home, CHF, CAD, A-Fib on coumadin, and Covid-19 infection.   OT comments  Pt received in recliner. Reports he has felt tired today, did not sleep well last night and declined PT this AM, although willing to engage in OT this afternoon. Pt transferred recliner to stand with CGA but required several tries to lift self from chair. Walked to sink with RW, gait belt, and CGA. Stated he did not know if he had the energy to perform grooming at sink, instead may need to do EOB. OT suggested starting in standing at sink, with option to move to EOB if necessary. Pt then able to complete grooming tasks in standing, for ~ 10 minutes, no LOB. Returned to recliner with CGA, pt able to lower himself into chair in a controlled manner. O2 sats at 94%, with pt on 4 L [place] O2. Chair alarm set, call bell within reach, and nurse in room at end of session. Pt continues to benefit from skilled OT services. Discharge plan for [facility] remains appropriate.   Follow Up Recommendations  Home Health OT   Equipment Recommendations       Recommendations for Other Services      Precautions / Restrictions Precautions Precautions: Fall Restrictions Weight Bearing Restrictions: No       Mobility Bed Mobility Overal bed mobility: Independent                Transfers Overall transfer level: Needs assistance Equipment used: Rolling walker (2 wheeled) Transfers: Sit to/from Stand Sit to Stand: Min guard              Balance Overall balance assessment: Needs assistance   Sitting balance-Leahy Scale: Good     Standing balance  support: No upper extremity supported;During functional activity Standing balance-Leahy Scale: Good                             ADL either performed or assessed with clinical judgement   ADL Overall ADL's : Needs assistance/impaired     Grooming: Set up;Min guard;Standing;Oral care;Applying deodorant;Wash/dry face;Wash/dry hands Grooming Details (indicate cue type and reason): Pt engaged in grooming activities standing at sink for ~ 10 minutes, CGA                                     Vision       Perception     Praxis      Cognition Arousal/Alertness: Awake/alert Behavior During Therapy: WFL for tasks assessed/performed Overall Cognitive Status: Within Functional Limits for tasks assessed                                 General Comments: Pt A&O x 4.        Exercises Other Exercises Other Exercises: standing balance, functional reach, activity tolerance   Shoulder Instructions       General Comments      Pertinent Vitals/ Pain  Pain Assessment: 0-10 Pain Score: 4  Pain Location: in chest/abdomen Pain Intervention(s): Monitored during session;Limited activity within patient's tolerance  Home Living Family/patient expects to be discharged to:: Private residence Living Arrangements: Alone Available Help at Discharge: Family;Available PRN/intermittently Type of Home: House Home Access: Ramped entrance     Home Layout: Multi-level;Able to live on main level with bedroom/bathroom     Bathroom Shower/Tub: Walk-in shower   Bathroom Toilet: Handicapped height     Home Equipment: Environmental consultant - 2 wheels;Cane - single point;Grab bars - toilet   Additional Comments: Uses SPC at home      Prior Functioning/Environment              Frequency           Progress Toward Goals  OT Goals(current goals can now be found in the care plan section)     Acute Rehab OT Goals Patient Stated Goal: to go home OT Goal  Formulation: With patient Time For Goal Achievement: 10/23/19 Potential to Achieve Goals: Good  Plan      Co-evaluation                 AM-PAC OT "6 Clicks" Daily Activity     Outcome Measure                    End of Session Equipment Utilized During Treatment: Oxygen;Rolling walker;Gait belt  OT Visit Diagnosis: Other abnormalities of gait and mobility (R26.89)   Activity Tolerance Patient tolerated treatment well   Patient Left in chair;with call bell/phone within reach;with chair alarm set;with nursing/sitter in room   Nurse Communication          Time: 6244-6950 OT Time Calculation (min): 24 min  Charges: OT General Charges $OT Visit: 1 Visit OT Treatments $Self Care/Home Management : 23-37 mins  Josiah Lobo, PhD, [place], OTR/L ascom [phone] 10/13/19, 3:55 PM

## 2019-10-13 NOTE — Progress Notes (Addendum)
Pharmacy Antibiotic Note  [name] is a 86 y.o. male admitted on 10/08/2019. PMH significant for HTN, HLD, COPD (on 2L [place] at baseline), CHF, CAD, and Afib. Blood cultures with 4/4 bottles staph capitis only resistant to cipro. Patient initially without signs of infection, so patient monitored on Unasyn.  Patient also started on Zpak for COPD exacerbation on 9/13. Pharmacy has been consulted for Unasyn dosing for aspiration pneumonia.  9/10 CT chest: Bronchial plugging in the dependent left lower lobe. Minimal dependent bibasilar atelectasis and/or consolidation and trace pleural effusions. Findings suggest aspiration. Day 6 total antibiotics, WBC was trending down and incr this AM (9/15), afebrile. Scr 1.37>1.25>1.12>0.98  Repeat Bcx NGTD. ID might be consulted if concern for true bacteremia.   Plan: Continue Unasyn 3 g IV q6h  Monitor renal function and adjust dose if needed  Height: 5\' 10"  (177.8 cm) Weight: 67 kg (147 lb 12.8 oz) IBW/kg (Calculated) : 73  Temp (24hrs), Avg:98.4 F (36.9 C), Min:97.5 F (36.4 C), Max:99.4 F (37.4 C)  Recent Labs  Lab 10/08/19 1150 10/08/19 1150 10/08/19 1416 10/09/19 0358 10/10/19 0305 10/10/19 1028 10/11/19 0411 10/12/19 0533 10/13/19 0526  WBC 12.8*   < >  --  13.0* 16.2*  --  12.8* 11.2* 12.5*  CREATININE 1.04   < >  --  1.37*  --  1.25* 1.12 1.16 0.98  LATICACIDVEN 1.1  --  0.9  --   --   --   --   --   --    < > = values in this interval not displayed.    Estimated Creatinine Clearance: 48.4 mL/min (by C-G formula based on SCr of 0.98 mg/dL).    No Known Allergies  Antimicrobials this admission: Unasyn 9/10 >> Vancomycin 9/12 >> Azithromycin 9/13>>9/18   Microbiology results: 9/10 BCID staph species 9/10 BCx: 4/4 staph SPECIES; staph capitis (Methicillin-sensitive Staphylococcus) resistant to cipro 9/10 Sputum: sent 9/13 Repeat Bcx: NGTD  Thank you for allowing pharmacy to be a part of this patient's  care.  Sherilyn Banker, PharmD Pharmacy Resident  10/13/2019 7:31 AM

## 2019-10-13 NOTE — Progress Notes (Addendum)
PROGRESS NOTE    [name]   ZOX:[id number]  DOB: 17-Nov-1928  PCP: Baxter Hire, MD    DOA: 10/08/2019 LOS: 5   Brief Narrative   [name] is a 86 y.o. male with medical history of hypertension, hyperlipidemia, COPD, on 2 L/min oxygen at home, CHF, CAD, atrial fibrillation on Coumadin, COVID-19 infection, who presented to ED on 10/08/19 with progressive shortness of breath, wet sounding cough but without sputum production, mid-anterior chest wall pain, and O2 desats to low 80's on his usual 2 L/min.    No fever or chills.  Onset of his pain occurred about a week ago, acutely when he got up from seated to pull a chair towards himself to put his legs up, as he always does.  He reported feeling "something pulling from my tummy up to my chest".  Evaluation in the ED - BNP elevated 1004, troponin and lactate normal, leukocytosis WBC 12.8k, negative Covid-19 PCR.  BP uncontrolled 202/66 >> 176/84, afebrile, HR 99, tachypneic RR 28.   Imaging -  CXR Increasing rounded density in the left apex when compared with the prior exam, possibly evolving infiltrate/mass.  CT Chest -  1. ...partially calcified biapical pleuroparenchymal scarring, which is not significantly changed compared to prior examination dated 2017 and accounts for radiographic abnormality. No evidence of mass. 2. Bronchial plugging in the dependent left lower lobe. Minimal dependent bibasilar atelectasis and/or consolidation and trace pleural effusions. Findings suggest aspiration. 3. There is a buckled fracture deformity of the mid sternal body with overlying soft tissue edema, new compared to prior examination.  Correlate for recent injury an acute point tenderness.  4. There are multiple vertebral body wedge and endplate deformities, several which are new compared to prior examination dated 2017, particularly a high-grade wedge deformity of the T7 vertebral body, a more subtle superior endplate deformity of  the T5 vertebral body, and an increased, now high-grade wedge deformity of the L2 vertebral body. These are of uncertain acuity. Correlate for acute point tenderness. MRI may be used to assess for marrow edema and fracture acuity if desired.  5. Moderate to severe emphysema. 6. Coronary artery disease. 7. Aortic Atherosclerosis (ICD10-I70.0). Aortic valve calcifications. Enlargement of the proximal descending thoracic aorta, measuring up to 3.6 x 3.3 cm, slightly enlarged compared to prior examination at which time it measured 3.2 x 3.2 cm. Recommend annual imaging followup by CTA or MRA if clinically appropriate.      Assessment & Plan   Principal Problem:   Acute on chronic respiratory failure with hypoxia (HCC) Active Problems:   Hypertension   Coronary artery disease   HLD (hyperlipidemia)   Atrial fibrillation, chronic (HCC)   Acute on chronic diastolic CHF (congestive heart failure) (HCC)   COPD exacerbation (HCC)   Aspiration pneumonia (HCC)   Sepsis (HCC)   Acute on chronic diastolic (congestive) heart failure (HCC)   Acute on chronic respiratory failure with hypoxia - POA with progressive shortness of breath and increased oxygen requirement of 3-5 l/min from baseline 2 l/min.   Multifactorial likely aspiration pneumonia/pneumonitis, and some elements of both dCHF (edema, positive JVD, BNP 1004) and mild COPD exacerbation with wheezing. --supplemental O2, maintain O2 sat > 90% --further mgmt as below   Severe sepsis secondary to Aspiration pneumonia - sepsis present on admission as evidenced by tachycardia, tachypnea, leukocytosis, worsened hypoxia reflective of organ dysfunction.  Lactic acid normal. Improving  --stop unasyn (9/10-9/15), start augmentin. --D/C Vanc --Follow blood & sputum  cultures --Prelim blood culture growing staph capitis, methicillin sensitive.  Possibly contaminate but grew in both sets. Per previous provider discussion w/ ID, if second set of  cultures negative can consider contaminant --Repeat blood cultures ordered 9/13 - negative so far.   Patient denies having any implants or hardware in his body.     Acute on chronic diastolic CHF / Valvular heart disease -  Presented with worsened hypoxia, elevated BNP, JVD, edema, pleural effusions, shortness of breath, all consistent with CHF.   Echo in 12/2018 showed EF of 54-65%, grade 1 diastolic dysfunction, mild LVH.   Echo on 9/12: EF 55-60%, grade 1 dd, signs of RV volume overload, moderate PASP elevation, severely dilated RA, mod-severe TR, mild MR. - net negative 900 ml --Developed AKI with IV diuresis, has resolved --resumed home Lasix 20 mg daily  --strict I/O's, daily weights --monitor renal function and electrolytes --Low sodium diet, fluid restriction   Acute Kidney Injury - Not present on admission, has resolved.  On day 2, Cr bumped 1.04>>1.37, likely due to diuresis.   Monitor BMP daily.   COPD with mild acute exacerbation - presented with wheezing, worsened hypoxia, improved with bronchodilators, all clinically consistent with COPD.  Clinically improving, o2 requirement 4-5 L still above baseline of 2 --Scheduled Duonebs q6h, PRN albuterol nebs --Schedule Mucinex -- abx as above --Reduce Prednisone to 30 mg & taper off, steroids started 9/10   Sternal body fracture - as shown by CT-chest on admission.  Given history provided of acute onset pain in chest/abdomen, tenderness on palpation, and several compression fractures, suspect fragility fracture. --Tylenol, Percocet PRN --Lidocaine patch --Monitor   Multiple Thoracic and Lumbar Compression Fractures - see imaging reports for details.  Involving T5, T7 (high grade), L2 (high grade).  Patient denies back pain and has no palpable midline tenderness along the entirety of his back.   MRI of T and L spine - T5 and T7 are acute to subacute, T3 and T4 subacute to chronic and more mild, L1 through L5 are  chronic. --likely fragility fractures, which is diagnostic of osteoporosis --daughter did report pt had a bad fall directly onto his mid back, striking his back on a goose statue, several months ago earlier this year.    Osteoporosis - as evidenced by several compression fractures, sternal fracture without trauma.   Will need outpatient follow up for ongoing management.  Started on calcium supplement, vit D as below.  Vitamin D Deficiency - vitamin D level 16.8 on 9/12.   --Start vitamin D 50k units weekly.  Follow up outpatient.   Hypertension - chonric, uncontrolled on admission.  Continue Cardia, metoprolol, metoprolol, recently started on low dose lisinopril for CHF.    Coronary artery disease Hyperlipidemia --Continue Lipitor   Atrial fibrillation, chronic - currently rate controlled.  Continue metoprolol.  Warfarin per pharmacy.  Monitor INR. Therapeutic today.    Patient BMI: Body mass index is 21.21 kg/m.   DVT prophylaxis: on warfarin   Diet:  Diet Orders (From admission, onward)    Start     Ordered   10/08/19 1529  Diet Heart Room service appropriate? Yes; Fluid consistency: Thin; Fluid restriction: 1500 mL Fluid  Diet effective now       Question Answer Comment  Room service appropriate? Yes   Fluid consistency: Thin   Fluid restriction: 1500 mL Fluid      10/08/19 1530            Code Status: DNR  Subjective 10/13/19    Patient seen at bedside this AM.  Reports he feels tired but overall OK. Sitting up side of bed. No significant shortness of breath. No chest pain. Has an appetite.   Disposition Plan & Communication   Status is: Inpatient  Inpatient status remains appropriate because of severity of illness, requiring IV therapies and further evaluation as above.  Awaiting culture sensitivities and repeat cultures neg  Dispo: The patient is from: Home              Anticipated d/c is to: Home with [facility]              Anticipated d/c date is: 1  day              Patient currently is not medically stable for d/c.     Family Communication: daughter updated telephonically, she will work on arranging outpt f/u appointments    Consults, Procedures, Significant Events   Consultants:   Pharmacy  Procedures:   None  Antimicrobials:   Unasyn 9/10 >  Vanco 9/12, discontinued  Z-pack 9/13 >> (5 days)   Objective   Vitals:   10/12/19 2055 10/13/19 0505 10/13/19 0903 10/13/19 0914  BP:  (!) 158/75 (!) 164/59   Pulse:  98 (!) 126   Resp:  19 17   Temp:  99.4 F (37.4 C) 98.1 F (36.7 C)   TempSrc:  Oral Oral   SpO2: 94% 91% (!) 85% 91%  Weight:  67 kg    Height:        Intake/Output Summary (Last 24 hours) at 10/13/2019 0952 Last data filed at 10/13/2019 0857 Gross per 24 hour  Intake 823 ml  Output 1025 ml  Net -202 ml   Filed Weights   10/09/19 0454 10/10/19 0529 10/13/19 0505  Weight: 62.5 kg 63.3 kg 67 kg    Physical Exam:  General exam: awake, alert, no acute distress, frail, pleasant Respiratory system: poor aeration, scattered mildwheezes, normal respiratory effort, on 4 L/min oxygen. Cardiovascular system: normal S1/S2, RRR, no peripheral edema.   Gastrointestinal system: soft, NT, ND, +bowel sounds Central nervous system: A&O x3. no gross focal neurologic deficits, normal speech    Labs   Data Reviewed: I have personally reviewed following labs and imaging studies  CBC: Recent Labs  Lab 10/08/19 1150 10/08/19 1150 10/09/19 0358 10/10/19 0305 10/11/19 0411 10/12/19 0533 10/13/19 0526  WBC 12.8*   < > 13.0* 16.2* 12.8* 11.2* 12.5*  NEUTROABS 8.6*  --   --   --   --   --   --   HGB 12.0*   < > 11.6* 10.9* 10.7* 11.5* 11.7*  HCT 35.0*   < > 34.7* 33.4* 31.9* 34.7* 35.8*  MCV 86.0   < > 88.5 88.4 88.1 87.8 89.9  PLT 209   < > 195 188 173 192 208   < > = values in this interval not displayed.   Basic Metabolic Panel: Recent Labs  Lab 10/09/19 0358 10/10/19 0305 10/10/19 1028  10/11/19 0411 10/12/19 0533 10/13/19 0526  NA 134*  --  139 138 141 140  K 4.6  --  3.9 3.8 3.8 3.9  CL 98  --  97* 100 102 99  CO2 26  --  29 30 30  32  GLUCOSE 152*  --  198* 156* 148* 98  BUN 33*  --  52* 55* 54* 46*  CREATININE 1.37*  --  1.25* 1.12 1.16 0.98  CALCIUM  8.8* 8.6 8.8* 8.5* 8.9 9.2  MG 2.2  --   --   --   --  2.3   GFR: Estimated Creatinine Clearance: 48.4 mL/min (by C-G formula based on SCr of 0.98 mg/dL). Liver Function Tests: Recent Labs  Lab 10/08/19 1150  AST 19  ALT 14  ALKPHOS 66  BILITOT 1.1  PROT 7.6  ALBUMIN 4.0   No results for input(s): LIPASE, AMYLASE in the last 168 hours. No results for input(s): AMMONIA in the last 168 hours. Coagulation Profile: Recent Labs  Lab 10/09/19 0358 10/10/19 0305 10/11/19 0411 10/12/19 0533 10/13/19 0526  INR 2.5* 3.5* 4.0* 2.8* 2.5*   Cardiac Enzymes: No results for input(s): CKTOTAL, CKMB, CKMBINDEX, TROPONINI in the last 168 hours. BNP (last 3 results) No results for input(s): PROBNP in the last 8760 hours. HbA1C: No results for input(s): HGBA1C in the last 72 hours. CBG: No results for input(s): GLUCAP in the last 168 hours. Lipid Profile: No results for input(s): CHOL, HDL, LDLCALC, TRIG, CHOLHDL, LDLDIRECT in the last 72 hours. Thyroid Function Tests: No results for input(s): TSH, T4TOTAL, FREET4, T3FREE, THYROIDAB in the last 72 hours. Anemia Panel: No results for input(s): VITAMINB12, FOLATE, FERRITIN, TIBC, IRON, RETICCTPCT in the last 72 hours. Sepsis Labs: Recent Labs  Lab 10/08/19 1150 10/08/19 1416  PROCALCITON  --  <0.10  LATICACIDVEN 1.1 0.9    Recent Results (from the past 240 hour(s))  SARS Coronavirus 2 by RT PCR (hospital order, performed in [facility] hospital lab) Nasopharyngeal Nasopharyngeal Swab     Status: None   Collection Time: 10/08/19 11:50 AM   Specimen: Nasopharyngeal Swab  Result Value Ref Range Status   SARS Coronavirus 2 NEGATIVE NEGATIVE Final    Comment:  (NOTE) SARS-CoV-2 target nucleic acids are NOT DETECTED.  The SARS-CoV-2 RNA is generally detectable in upper and lower respiratory specimens during the acute phase of infection. The lowest concentration of SARS-CoV-2 viral copies this assay can detect is 250 copies / mL. A negative result does not preclude SARS-CoV-2 infection and should not be used as the sole basis for treatment or other patient management decisions.  A negative result may occur with improper specimen collection / handling, submission of specimen other than nasopharyngeal swab, presence of viral mutation(s) within the areas targeted by this assay, and inadequate number of viral copies (<250 copies / mL). A negative result must be combined with clinical observations, patient history, and epidemiological information.  Fact Sheet for Patients:   StrictlyIdeas.no  Fact Sheet for Healthcare Providers: BankingDealers.co.za  This test is not yet approved or  cleared by the [place] FDA and has been authorized for detection and/or diagnosis of SARS-CoV-2 by FDA under an Emergency Use Authorization (EUA).  This EUA will remain in effect (meaning this test can be used) for the duration of the COVID-19 declaration under Section 564(b)(1) of the Act, 21 U.S.C. section 360bbb-3(b)(1), unless the authorization is terminated or revoked sooner.  Performed at [facility], [address] [place], [place] [address]   CULTURE, BLOOD (ROUTINE X 2) w Reflex to ID Panel     Status: Abnormal   Collection Time: 10/08/19  3:45 PM   Specimen: BLOOD  Result Value Ref Range Status   Specimen Description   Final    BLOOD RIGHT ANTECUBITAL Performed at [facility], [address] [place], [place] [address]    Special Requests   Final    BOTTLES DRAWN AEROBIC AND ANAEROBIC Blood Culture  adequate volume Performed at [facility], [address]  [place], [place] [address]    Culture  Setup Time   Final    GRAM POSITIVE COCCI IN BOTH AEROBIC AND ANAEROBIC BOTTLES CRITICAL RESULT CALLED TO, READ BACK BY AND VERIFIED WITH: Dorena Bodo AT 3235 10/09/19 SDR Performed at [facility], [place] [address] [place], [place] [address]    Culture STAPHYLOCOCCUS CAPITIS (A)  Final   Report Status 10/12/2019 FINAL  Final   Organism ID, Bacteria STAPHYLOCOCCUS CAPITIS  Final      Susceptibility   Staphylococcus capitis - MIC*    CIPROFLOXACIN 4 RESISTANT Resistant     ERYTHROMYCIN <=0.25 SENSITIVE Sensitive     GENTAMICIN <=0.5 SENSITIVE Sensitive     OXACILLIN <=0.25 SENSITIVE Sensitive     TETRACYCLINE <=1 SENSITIVE Sensitive     VANCOMYCIN 1 SENSITIVE Sensitive     TRIMETH/SULFA <=10 SENSITIVE Sensitive     CLINDAMYCIN <=0.25 SENSITIVE Sensitive     RIFAMPIN <=0.5 SENSITIVE Sensitive     Inducible Clindamycin NEGATIVE Sensitive     * STAPHYLOCOCCUS CAPITIS  CULTURE, BLOOD (ROUTINE X 2) w Reflex to ID Panel     Status: Abnormal   Collection Time: 10/08/19  3:45 PM   Specimen: BLOOD  Result Value Ref Range Status   Specimen Description   Final    BLOOD LEFT ANTECUBITAL Performed at [facility], [address] [place], [place] [address]    Special Requests   Final    BOTTLES DRAWN AEROBIC AND ANAEROBIC Blood Culture adequate volume Performed at [facility], [address] [place], [place] [address]    Culture  Setup Time   Final    GRAM POSITIVE COCCI IN BOTH AEROBIC AND ANAEROBIC BOTTLES CRITICAL VALUE NOTED.  VALUE IS CONSISTENT WITH PREVIOUSLY REPORTED AND CALLED VALUE. Performed at [facility], [address] [place], [place] [address]    Culture (A)  Final    STAPHYLOCOCCUS CAPITIS SUSCEPTIBILITIES PERFORMED ON PREVIOUS CULTURE WITHIN THE LAST 5 DAYS. Performed at [facility], [place] [address] [place],  [address]    Report Status 10/12/2019 FINAL  Final  Blood Culture ID  Panel (Reflexed)     Status: Abnormal   Collection Time: 10/08/19  3:45 PM  Result Value Ref Range Status   Enterococcus faecalis NOT DETECTED NOT DETECTED Final   Enterococcus Faecium NOT DETECTED NOT DETECTED Final   Listeria monocytogenes NOT DETECTED NOT DETECTED Final   Staphylococcus species DETECTED (A) NOT DETECTED Final    Comment: CRITICAL RESULT CALLED TO, READ BACK BY AND VERIFIED WITH:  ABBY ELLINGTON AT 0907 10/09/19 SDR    Staphylococcus aureus (BCID) NOT DETECTED NOT DETECTED Final   Staphylococcus epidermidis NOT DETECTED NOT DETECTED Final   Staphylococcus lugdunensis NOT DETECTED NOT DETECTED Final   Streptococcus species NOT DETECTED NOT DETECTED Final   Streptococcus agalactiae NOT DETECTED NOT DETECTED Final   Streptococcus pneumoniae NOT DETECTED NOT DETECTED Final   Streptococcus pyogenes NOT DETECTED NOT DETECTED Final   A.calcoaceticus-baumannii NOT DETECTED NOT DETECTED Final   Bacteroides fragilis NOT DETECTED NOT DETECTED Final   Enterobacterales NOT DETECTED NOT DETECTED Final   Enterobacter cloacae complex NOT DETECTED NOT DETECTED Final   Escherichia coli NOT DETECTED NOT DETECTED Final   Klebsiella aerogenes NOT DETECTED NOT DETECTED Final   Klebsiella oxytoca NOT DETECTED NOT DETECTED Final   Klebsiella pneumoniae NOT DETECTED NOT DETECTED Final   Proteus species NOT DETECTED NOT  DETECTED Final   Salmonella species NOT DETECTED NOT DETECTED Final   Serratia marcescens NOT DETECTED NOT DETECTED Final   Haemophilus influenzae NOT DETECTED NOT DETECTED Final   Neisseria meningitidis NOT DETECTED NOT DETECTED Final   Pseudomonas aeruginosa NOT DETECTED NOT DETECTED Final   Stenotrophomonas maltophilia NOT DETECTED NOT DETECTED Final   Candida albicans NOT DETECTED NOT DETECTED Final   Candida auris NOT DETECTED NOT DETECTED Final   Candida glabrata NOT DETECTED NOT DETECTED Final   Candida krusei NOT DETECTED NOT DETECTED Final   Candida parapsilosis  NOT DETECTED NOT DETECTED Final   Candida tropicalis NOT DETECTED NOT DETECTED Final   Cryptococcus neoformans/gattii NOT DETECTED NOT DETECTED Final    Comment: Performed at [facility], [address] [place], [place] [address]  CULTURE, BLOOD (ROUTINE X 2) w Reflex to ID Panel     Status: None (Preliminary result)   Collection Time: 10/11/19  2:23 PM   Specimen: BLOOD  Result Value Ref Range Status   Specimen Description BLOOD BLOOD RIGHT FOREARM  Final   Special Requests   Final    BOTTLES DRAWN AEROBIC AND ANAEROBIC Blood Culture adequate volume   Culture   Final    NO GROWTH 2 DAYS Performed at [facility], [address] [place], [place] [address]    Report Status PENDING  Incomplete  CULTURE, BLOOD (ROUTINE X 2) w Reflex to ID Panel     Status: None (Preliminary result)   Collection Time: 10/11/19  2:29 PM   Specimen: BLOOD  Result Value Ref Range Status   Specimen Description BLOOD BLOOD RIGHT HAND  Final   Special Requests   Final    BOTTLES DRAWN AEROBIC AND ANAEROBIC Blood Culture adequate volume   Culture   Final    NO GROWTH 2 DAYS Performed at [facility], [address], [place],  [address]    Report Status PENDING  Incomplete      Imaging Studies   DG Chest Port 1 View  Result Date: 10/12/2019 CLINICAL DATA:  Acute on chronic respiratory failure EXAM: PORTABLE CHEST 1 VIEW COMPARISON:  10/08/2019, 01/24/2019, 02/01/2014 CT 10/08/2019 FINDINGS: Hyperinflation with emphysematous disease. Biapical pleural and parenchymal scarring. Cardiomegaly with enlarged central pulmonary vessels. Suspected trace left pleural effusion. Streaky atelectasis at the left base. Aortic atherosclerosis. No pneumothorax. IMPRESSION: 1. Cardiomegaly with small left pleural effusion and streaky atelectasis at the left base. 2. Emphysematous disease. 3. Enlarged appearing central pulmonary vessels suggesting arterial hypertension. Electronically  Signed   By: Donavan Foil M.D.   On: 10/12/2019 02:17     Medications   Scheduled Meds: . atorvastatin  20 mg Oral Daily  . azithromycin  250 mg Oral Daily  . calcium carbonate  1 tablet Oral BID WC  . dextromethorphan-guaiFENesin  1 tablet Oral BID  . diltiazem  300 mg Oral Daily  . furosemide  20 mg Oral Daily  . ipratropium-albuterol  3 mL Nebulization TID  . lidocaine  1 patch Transdermal Q24H  . lisinopril  2.5 mg Oral Daily  . loratadine  10 mg Oral Daily  . mouth rinse  15 mL Mouth Rinse BID  . metoprolol tartrate  50 mg Oral BID  . predniSONE  30 mg Oral Q breakfast  . sodium chloride flush  3 mL Intravenous Q12H  . Vitamin D (Ergocalciferol)  50,000 Units Oral Q7 days  . warfarin  1 mg Oral ONCE-1600  . Warfarin - Pharmacist Dosing Inpatient  Does not apply q1600   Continuous Infusions: . sodium chloride Stopped (10/10/19 1043)  . ampicillin-sulbactam (UNASYN) IV 3 g (10/13/19 0857)       LOS: 5 days    Time spent: 30 minutes with > 50% spent in coordinating care and direct patient contact    Desma Maxim, MD [facility]  10/13/2019, 9:52 AM    If 7PM-7AM, please contact night-coverage. How to contact the [facility] Attending or Consulting provider [place] or covering provider during after hours [place], for this patient?    1. Check the care team in [facility] and look for a) attending/consulting [facility] provider listed and b) the [facility] team listed 2. Log into [email or web address] and use [place]'s universal password to access. If you do not have the password, please contact the hospital operator. 3. Locate the [facility] provider you are looking for under [facility] and page to a number that you can be directly reached. 4. If you still have difficulty reaching the provider, please page the [facility] (Director on Call) for the Hospitalists listed on amion for assistance.

## 2019-10-14 LAB — CBC
HCT: 31.2 % — ABNORMAL LOW (ref 39.0–52.0)
HCT: 34.1 % — ABNORMAL LOW (ref 39.0–52.0)
Hemoglobin: 9.9 g/dL — ABNORMAL LOW (ref 13.0–17.0)
Hemoglobin: 11.2 g/dL — ABNORMAL LOW (ref 13.0–17.0)
MCH: 29 pg (ref 26.0–34.0)
MCH: 29.4 pg (ref 26.0–34.0)
MCHC: 31.7 g/dL (ref 30.0–36.0)
MCHC: 32.8 g/dL (ref 30.0–36.0)
MCV: 91.5 fL (ref 80.0–100.0)
MCV: 89.5 fL (ref 80.0–100.0)
Platelets: 194 10*3/uL (ref 150–400)
Platelets: 215 10*3/uL (ref 150–400)
RBC: 3.41 MIL/uL — ABNORMAL LOW (ref 4.22–5.81)
RBC: 3.81 MIL/uL — ABNORMAL LOW (ref 4.22–5.81)
RDW: 15.5 % (ref 11.5–15.5)
RDW: 15.3 % (ref 11.5–15.5)
WBC: 12.9 10*3/uL — ABNORMAL HIGH (ref 4.0–10.5)
WBC: 14.5 10*3/uL — ABNORMAL HIGH (ref 4.0–10.5)
nRBC: 0 % (ref 0.0–0.2)
nRBC: 0 % (ref 0.0–0.2)

## 2019-10-14 LAB — BASIC METABOLIC PANEL
Anion gap: 9 (ref 5–15)
BUN: 44 mg/dL — ABNORMAL HIGH (ref 8–23)
CO2: 32 mmol/L (ref 22–32)
Calcium: 8.9 mg/dL (ref 8.9–10.3)
Chloride: 99 mmol/L (ref 98–111)
Creatinine, Ser: 0.91 mg/dL (ref 0.61–1.24)
GFR calc Af Amer: >60 mL/min (ref >60)
GFR calc non Af Amer: >60 mL/min (ref >60)
Glucose, Bld: 98 mg/dL (ref 70–99)
Potassium: 4 mmol/L (ref 3.5–5.1)
Sodium: 140 mmol/L (ref 135–145)

## 2019-10-14 LAB — PROTIME-INR
INR: 1.7 — ABNORMAL HIGH (ref 0.8–1.2)
Prothrombin Time: 19 seconds — ABNORMAL HIGH (ref 11.4–15.2)

## 2019-10-14 MED ORDER — INFLUENZA VAC A&B SA ADJ QUAD 0.5 ML IM PRSY
0.5000 mL | PREFILLED_SYRINGE | Freq: Once | INTRAMUSCULAR | Status: DC
  Filled 2019-10-14: qty 0.5

## 2019-10-14 MED ORDER — WARFARIN SODIUM 2 MG PO TABS
2.0000 mg | ORAL_TABLET | Freq: Once | ORAL | Status: AC
  Administered 2019-10-14: 2 mg via ORAL
  Filled 2019-10-14: qty 1

## 2019-10-14 NOTE — Consult Note (Addendum)
ANTICOAGULATION CONSULT NOTE - Initial Consult  Pharmacy Consult for warfarin Indication: atrial fibrillation  No Known Allergies  Patient Measurements: Height: 5\' 10"  (177.8 cm) Weight: 67 kg (147 lb 12.8 oz) IBW/kg (Calculated) : 73  Vital Signs: Temp: 98.6 F (37 C) (09/16 0358) Temp Source: Oral (09/16 0358) BP: 134/80 (09/16 0358) Pulse Rate: 87 (09/16 0358)  Labs: Recent Labs    10/12/19 0533 10/13/19 0526  HGB 11.5* 11.7*  HCT 34.7* 35.8*  PLT 192 208  LABPROT 28.5* 25.9*  INR 2.8* 2.5*  CREATININE 1.16 0.98    Estimated Creatinine Clearance: 48.4 mL/min (by C-G formula based on SCr of 0.98 mg/dL).   Medical History: Past Medical History:  Diagnosis Date  . CHF (congestive heart failure) ([place])   . COPD (chronic obstructive pulmonary disease) ([place])   . Coronary artery disease   . Hypertension     Medications:  Medications Prior to Admission  Medication Sig Dispense Refill Last Dose  . albuterol (PROVENTIL) (2.5 MG/3ML) 0.083% nebulizer solution Take 3 mLs (2.5 mg total) by nebulization every 6 (six) hours as needed for wheezing or shortness of breath. 75 mL 0 PRN at PRN  . albuterol (VENTOLIN HFA) 108 (90 Base) MCG/ACT inhaler Inhale into the lungs every 6 (six) hours as needed for wheezing or shortness of breath.   PRN at PRN  . atorvastatin (LIPITOR) 20 MG tablet Take 20 mg by mouth daily.   10/07/2019 at Unknown time  . CARTIA XT 300 MG 24 hr capsule Take 300 mg by mouth daily.   10/07/2019 at Unknown time  . fexofenadine (ALLEGRA) 180 MG tablet Take 180 mg by mouth daily.   PRN at PRN  . Fluticasone-Umeclidin-Vilant (TRELEGY ELLIPTA) 100-62.5-25 MCG/INH AEPB Inhale 1 puff into the lungs daily.    10/07/2019 at Unknown time  . furosemide (LASIX) 20 MG tablet TAKE 1 TABLET BY MOUTH DAILY 30 tablet 5 10/07/2019 at Unknown time  . metoprolol tartrate (LOPRESSOR) 50 MG tablet Take 50 mg by mouth 2 (two) times daily.   10/07/2019 at Unknown time  . mometasone  (NASONEX) 50 MCG/ACT nasal spray Place 1 spray into the nose daily. Each nostril   Past Week at PRN  . potassium chloride (KLOR-CON) 10 MEQ tablet Take 10 mEq by mouth daily.   10/07/2019 at Unknown time  . predniSONE (DELTASONE) 10 MG tablet Take 5 mg by mouth daily with breakfast.   10/07/2019 at Unknown time  . traZODone (DESYREL) 50 MG tablet Take 50 mg by mouth at bedtime as needed for sleep.    Past Month at PRN  . warfarin (COUMADIN) 2 MG tablet Take 2 mg by mouth daily.    10/07/2019 at 1800  . warfarin (COUMADIN) 1 MG tablet Take 1 mg by mouth daily.   10/04/2019    Assessment: 86 year old male presenting with cough, chest pain, and shortness of breath. Pt admitted for acute on chronic respiratory failure. CT showing possible aspiration pneumonia. PMH includes atrial fibrillation, CHF, COPD, CAD, and HTN. Pharmacy has been consulted to restart home warfarin. Pt was taking warfarin 2 mg daily prior to admission.   Hgb was stable between 10-11 and has dropped 11.7>9.9 (physician aware) Plt WNL  Goal of Therapy:  INR 2-3 Monitor platelets by anticoagulation protocol: Yes   Date INR Dose  9/10 2.4 2 mg  9/11 2.5 2 mg  9/12 3.5 Held  9/13 4.0 Held  9/14 2.8 1 mg  9/15 2.5 1 mg  9/16  1.7    Elevated INR likely d/t interaction with Unasyn (started 9/10). Pt is also taking azithromycin (9/14-9/18) and prednisone which may also effect INR. INR appropriately trending down after holding warfarin x2 days. INR 1.7 subtherapeutic today (9/16) possibly d/t change in appetite/PO intake  Plan:  Will give warfarin 2mg  tonight  Continue to monitor for drug interactions with warfarin Re-check INR and CBC daily with AM labs  Sherilyn Banker, PharmD Pharmacy Resident  10/14/2019 7:16 AM

## 2019-10-14 NOTE — Progress Notes (Signed)
Physical Therapy Treatment Patient Details Name: [name] MRN: [id number] DOB: 06-09-1928 Today's Date: 10/14/2019    History of Present Illness [name] is an 86 y/o male who was admitted for acute on chronic respiratory failure with hypoxia. Pt arrived via EMS with complaints of progressive SOB and dry cough. PMH includes HTN, HLD, COPD, on 2L O2 at baseline at home, CHF, CAD, A-Fib on coumadin, and Covid-19 infection.    PT Comments    Patient received in bed in fowler's position eating breakfast. Pt agreeable to work with PT and finish eating in the recliner chair. Pt able to sit EOB with use of bedrails for minimal support. He stood to RW without any support but requires VCs for proper hand placement when standing up. He ambulated about 260 feet with RW and no reports of SOB. At rest, his O2 sat level was around 90% on 3L of O2, but after ambulation his O2 sat level dropped to 84% on 3L. Patient had no reports of dizziness or SOB. His O2 sat level increased to 92%. Patient left in recliner chair with all needs to finish up breakfast. Pt continues to benefit from skilled PT services. Discharge plan for HHPT remains appropriate.   Follow Up Recommendations  Home health PT     Equipment Recommendations  None recommended by PT    Recommendations for Other Services       Precautions / Restrictions Precautions Precautions: Fall Restrictions Weight Bearing Restrictions: No    Mobility  Bed Mobility Overal bed mobility: Independent             General bed mobility comments: Pt able to sit EOB with no increased time and without use of bedrails  Transfers Overall transfer level: Needs assistance Equipment used: Rolling walker (2 wheeled) Transfers: Sit to/from Stand Sit to Stand: Min guard         General transfer comment: CGA for sit <>stand and VCs used for proper positioning to sit in recliner chair  Ambulation/Gait Ambulation/Gait assistance: Min  guard Gait Distance (Feet): 260 Feet Assistive device: Rolling walker (2 wheeled) Gait Pattern/deviations: WFL(Within Functional Limits);Step-through pattern     General Gait Details: Pt has steady gait speed with equal step lengths bilaterally; No cues needed for RW management; CGA assist for safety   Stairs             Wheelchair Mobility    Modified Rankin (Stroke Patients Only)       Balance Overall balance assessment: Needs assistance Sitting-balance support: Feet supported Sitting balance-Leahy Scale: Good Sitting balance - Comments: Patient able to maintain good seating balance EOB without UE support while reaching within BOS Postural control: Other (comment) (Good postural control, no lean) Standing balance support: Bilateral upper extremity supported;During functional activity Standing balance-Leahy Scale: Good Standing balance comment: Patient is steady UE on RW                            Cognition Arousal/Alertness: Awake/alert Behavior During Therapy: WFL for tasks assessed/performed Overall Cognitive Status: Within Functional Limits for tasks assessed                                 General Comments: Pt A&O x 4.      Exercises Other Exercises Other Exercises: sitting balance, sit<>stand, standing balance    General Comments General comments (skin integrity, edema, etc.): Monitored  O2 sat levels throughout session      Pertinent Vitals/Pain Pain Assessment: No/denies pain    Home Living                      Prior Function            PT Goals (current goals can now be found in the care plan section) Acute Rehab PT Goals Patient Stated Goal: to go home PT Goal Formulation: With patient Time For Goal Achievement: 10/23/19 Potential to Achieve Goals: Good Progress towards PT goals: Progressing toward goals    Frequency    Min 2X/week      PT Plan Current plan remains appropriate    Co-evaluation               AM-PAC PT "6 Clicks" Mobility   Outcome Measure  Help needed turning from your back to your side while in a flat bed without using bedrails?: None Help needed moving from lying on your back to sitting on the side of a flat bed without using bedrails?: None Help needed moving to and from a bed to a chair (including a wheelchair)?: A Little Help needed standing up from a chair using your arms (e.g., wheelchair or bedside chair)?: A Little Help needed to walk in hospital room?: A Little Help needed climbing 3-5 steps with a railing? : A Little 6 Click Score: 20    End of Session Equipment Utilized During Treatment: Gait belt;Oxygen Activity Tolerance: Patient tolerated treatment well Patient left: in chair;with chair alarm set;with call bell/phone within reach Nurse Communication: Mobility status PT Visit Diagnosis: Unsteadiness on feet (R26.81);Muscle weakness (generalized) (M62.81)     Time: 6962-9528 PT Time Calculation (min) (ACUTE ONLY): 26 min  Charges:                         Noemi Chapel, SPT Bernita Raisin 10/14/2019, 11:12 AM

## 2019-10-14 NOTE — Progress Notes (Signed)
Mobility Specialist - Progress Note   10/14/19 1110  Mobility  Activity Ambulated in room  Level of Assistance Contact guard assist, steadying assist  Assistive Device Front wheel walker  Distance Ambulated (ft) 20 ft  Mobility Response Tolerated well  Mobility performed by Mobility specialist  $Mobility charge 1 Mobility    Pre-mobility: 118 HR, 118/69 BP, 89% SpO2 Post-mobility: 114 HR, 124/66 BP, 94% SpO2  Pt was sitting on recliner, on 2L O2 [place] upon arrival. Pt agreed to session. Pt's O2 sitting at 89% on 2L O2 [place] at rest. Mobility specialist increased O2 to 3L. O2 sat up to 93% immediately. Pt ambulated from recliner to room door. Pt needed CGA. O2 measured at room door and had desat to 82% while ambulating on 3L O2 [place]. Pt was asymptomatic, had no complaints during session. O2 increased to 4L, pt's O2 increased up to 84% immediately. Further ambulation limited d/t low O2. Pt cued to ambulate back to recliner. After sitting on the recliner for ~ 5 minutes, O2 increased up to 94% on 4L O2 [place]. Overall, pt tolerated session well. Pt requested to drink some water. Mobility specialist notified nurse and gave pt a cup of water. Pt left sitting on recliner, utlizing 4L O2 [place], w/ alarm set. Call bell and phone placed in reach.     Naliya Gish Mobility Specialist  10/14/19, 11:20 AM

## 2019-10-14 NOTE — Care Management Important Message (Signed)
Important Message  Patient Details  Name: [name] MRN: [id number] Date of Birth: 08-20-1928   Medicare Important Message Given:  Yes     Dannette Barbara 10/14/2019, 1:14 PM

## 2019-10-14 NOTE — Progress Notes (Signed)
PROGRESS NOTE    [name]   CHY:[id number]  DOB: 04-03-1928  PCP: Baxter Hire, MD    DOA: 10/08/2019 LOS: 6   Brief Narrative   [name] is a 86 y.o. male with medical history of hypertension, hyperlipidemia, COPD, on 2 L/min oxygen at home, CHF, CAD, atrial fibrillation on Coumadin, COVID-19 infection, who presented to ED on 10/08/19 with progressive shortness of breath, wet sounding cough but without sputum production, mid-anterior chest wall pain, and O2 desats to low 80's on his usual 2 L/min.    No fever or chills.  Onset of his pain occurred about a week ago, acutely when he got up from seated to pull a chair towards himself to put his legs up, as he always does.  He reported feeling "something pulling from my tummy up to my chest".  Evaluation in the ED - BNP elevated 1004, troponin and lactate normal, leukocytosis WBC 12.8k, negative Covid-19 PCR.  BP uncontrolled 202/66 >> 176/84, afebrile, HR 99, tachypneic RR 28.   Imaging -  CXR Increasing rounded density in the left apex when compared with the prior exam, possibly evolving infiltrate/mass.  CT Chest -  1. ...partially calcified biapical pleuroparenchymal scarring, which is not significantly changed compared to prior examination dated 2017 and accounts for radiographic abnormality. No evidence of mass. 2. Bronchial plugging in the dependent left lower lobe. Minimal dependent bibasilar atelectasis and/or consolidation and trace pleural effusions. Findings suggest aspiration. 3. There is a buckled fracture deformity of the mid sternal body with overlying soft tissue edema, new compared to prior examination.  Correlate for recent injury an acute point tenderness.  4. There are multiple vertebral body wedge and endplate deformities, several which are new compared to prior examination dated 2017, particularly a high-grade wedge deformity of the T7 vertebral body, a more subtle superior endplate deformity of  the T5 vertebral body, and an increased, now high-grade wedge deformity of the L2 vertebral body. These are of uncertain acuity. Correlate for acute point tenderness. MRI may be used to assess for marrow edema and fracture acuity if desired.  5. Moderate to severe emphysema. 6. Coronary artery disease. 7. Aortic Atherosclerosis (ICD10-I70.0). Aortic valve calcifications. Enlargement of the proximal descending thoracic aorta, measuring up to 3.6 x 3.3 cm, slightly enlarged compared to prior examination at which time it measured 3.2 x 3.2 cm. Recommend annual imaging followup by CTA or MRA if clinically appropriate.      Assessment & Plan   Principal Problem:   Acute on chronic respiratory failure with hypoxia (HCC) Active Problems:   Hypertension   Coronary artery disease   HLD (hyperlipidemia)   Atrial fibrillation, chronic (HCC)   Acute on chronic diastolic CHF (congestive heart failure) (HCC)   COPD exacerbation (HCC)   Aspiration pneumonia (HCC)   Sepsis (HCC)   Acute on chronic diastolic (congestive) heart failure (HCC)   Acute on chronic respiratory failure with hypoxia - POA with progressive shortness of breath and increased oxygen requirement of 3-5 l/min from baseline 2 l/min.   Multifactorial likely aspiration pneumonia/pneumonitis, and some elements of both dCHF (edema, positive JVD, BNP 1004) and mild COPD exacerbation with wheezing. --supplemental O2, maintain O2 sat > 90%. Now weaned to 3 L --further mgmt as below   Severe sepsis secondary to Aspiration pneumonia - sepsis present on admission as evidenced by tachycardia, tachypnea, leukocytosis, worsened hypoxia reflective of organ dysfunction.  Lactic acid normal. Improving  --stop unasyn (9/10-9/15), started augmentin 9/15,  can d/c abx at discharge, plan for d/c tomorrow. --D/C Vanc --Follow blood & sputum cultures --Prelim blood culture growing staph capitis, methicillin sensitive.  Possibly contaminate but grew  in both sets. Per previous provider discussion w/ ID, if second set of cultures negative can consider contaminant --Repeat blood cultures ordered 9/13 - negative so far so do think contaminant Patient denies having any implants or hardware in his body.     Acute on chronic diastolic CHF / Valvular heart disease -  Presented with worsened hypoxia, elevated BNP, JVD, edema, pleural effusions, shortness of breath, all consistent with CHF.   Echo in 12/2018 showed EF of 16-10%, grade 1 diastolic dysfunction, mild LVH.   Echo on 9/12: EF 55-60%, grade 1 dd, signs of RV volume overload, moderate PASP elevation, severely dilated RA, mod-severe TR, mild MR. - net negative 500 today --Developed AKI with IV diuresis, has resolved --resumed home Lasix 20 mg daily, tolerating --strict I/O's, daily weights --monitor renal function and electrolytes --Low sodium diet, fluid restriction   Acute Kidney Injury - Not present on admission, has resolved.  On day 2, Cr bumped 1.04>>1.37, likely due to diuresis.   Monitor BMP daily.   COPD with mild acute exacerbation - presented with wheezing, worsened hypoxia, improved with bronchodilators, all clinically consistent with COPD.  Clinically improving, o2 requirement 4-5 L still above baseline of 2 --Scheduled Duonebs q6h, PRN albuterol nebs --Schedule Mucinex -- abx as above --Reduce Prednisone to 30 mg & taper off, steroids started 9/10   Sternal body fracture - as shown by CT-chest on admission.  Given history provided of acute onset pain in chest/abdomen, tenderness on palpation, and several compression fractures, suspect fragility fracture. --Tylenol, Percocet PRN --Lidocaine patch --Monitor   Multiple Thoracic and Lumbar Compression Fractures - see imaging reports for details.  Involving T5, T7 (high grade), L2 (high grade).  Patient denies back pain and has no palpable midline tenderness along the entirety of his back.   MRI of T and L spine - T5  and T7 are acute to subacute, T3 and T4 subacute to chronic and more mild, L1 through L5 are chronic. --likely fragility fractures, which is diagnostic of osteoporosis --daughter did report pt had a bad fall directly onto his mid back, striking his back on a goose statue, several months ago earlier this year.    Osteoporosis - as evidenced by several compression fractures, sternal fracture without trauma.   Will need outpatient follow up for ongoing management.  Started on calcium supplement, vit D as below.  Vitamin D Deficiency - vitamin D level 16.8 on 9/12.   --Start vitamin D 50k units weekly.  Follow up outpatient.   Hypertension - chonric, uncontrolled on admission.  Continue Cardia, metoprolol, metoprolol, recently started on low dose lisinopril for CHF.    Coronary artery disease Hyperlipidemia --Continue Lipitor   Atrial fibrillation, chronic - currently rate controlled.  Continue metoprolol.  Warfarin per pharmacy.  Monitor INR. Therapeutic today.    Patient BMI: Body mass index is 21.21 kg/m.   DVT prophylaxis: on warfarin   Diet:  Diet Orders (From admission, onward)    Start     Ordered   10/08/19 1529  Diet Heart Room service appropriate? Yes; Fluid consistency: Thin; Fluid restriction: 1500 mL Fluid  Diet effective now       Question Answer Comment  Room service appropriate? Yes   Fluid consistency: Thin   Fluid restriction: 1500 mL Fluid  10/08/19 1530            Code Status: DNR    Subjective 10/14/19    Patient seen at bedside this AM.  Reports steady improvement, particularly in terms of breathing. Energy better as well. Normal appetite. No chest pain. No vomiting or diarrhea.   Disposition Plan & Communication   Status is: Inpatient  Inpatient status remains appropriate because of severity of illness, requiring IV therapies and further evaluation as above.  Awaiting culture sensitivities and repeat cultures neg  Dispo: The patient is  from: Home              Anticipated d/c is to: Home with [facility]              Anticipated d/c date is: 1 day              Patient currently is not medically stable for d/c.     Family Communication: daughter updated telephonically 9/15, she will work on arranging outpt f/u appointments    Consults, Procedures, Significant Events   Consultants:   Pharmacy  Procedures:   None  Antimicrobials:   Unasyn 9/10 > 9/15  Vanco 9/12, discontinued  Z-pack 9/13 >> (5 days)  Augmentin 9/15 >   Objective   Vitals:   10/13/19 2036 10/14/19 0358 10/14/19 0854 10/14/19 1218  BP:  134/80 (!) 153/79 130/78  Pulse:  87 98 84  Resp:   18 18  Temp:  98.6 F (37 C) 98.7 F (37.1 C) 99.1 F (37.3 C)  TempSrc:  Oral Oral Oral  SpO2: 96% 92% 97% 97%  Weight:      Height:        Intake/Output Summary (Last 24 hours) at 10/14/2019 1610 Last data filed at 10/14/2019 1145 Gross per 24 hour  Intake 243 ml  Output 125 ml  Net 118 ml   Filed Weights   10/09/19 0454 10/10/19 0529 10/13/19 0505  Weight: 62.5 kg 63.3 kg 67 kg    Physical Exam:  General exam: awake, alert, no acute distress, frail, pleasant Respiratory system: normal respiratory effort, on 4 L/min oxygen, clear Cardiovascular system: normal S1/S2, RRR, no peripheral edema.   Gastrointestinal system: soft, NT, ND, +bowel sounds Central nervous system: A&O x3. no gross focal neurologic deficits, normal speech    Labs   Data Reviewed: I have personally reviewed following labs and imaging studies  CBC: Recent Labs  Lab 10/08/19 1150 10/09/19 0358 10/11/19 0411 10/12/19 0533 10/13/19 0526 10/14/19 0529 10/14/19 1204  WBC 12.8*   < > 12.8* 11.2* 12.5* 12.9* 14.5*  NEUTROABS 8.6*  --   --   --   --   --   --   HGB 12.0*   < > 10.7* 11.5* 11.7* 9.9* 11.2*  HCT 35.0*   < > 31.9* 34.7* 35.8* 31.2* 34.1*  MCV 86.0   < > 88.1 87.8 89.9 91.5 89.5  PLT 209   < > 173 192 208 194 215   < > = values in this interval not  displayed.   Basic Metabolic Panel: Recent Labs  Lab 10/09/19 0358 10/10/19 0305 10/10/19 1028 10/11/19 0411 10/12/19 0533 10/13/19 0526 10/14/19 0529  NA 134*  --  139 138 141 140 140  K 4.6  --  3.9 3.8 3.8 3.9 4.0  CL 98  --  97* 100 102 99 99  CO2 26  --  29 30 30  32 32  GLUCOSE 152*  --  198*  156* 148* 98 98  BUN 33*  --  52* 55* 54* 46* 44*  CREATININE 1.37*  --  1.25* 1.12 1.16 0.98 0.91  CALCIUM 8.8*   < > 8.8* 8.5* 8.9 9.2 8.9  MG 2.2  --   --   --   --  2.3  --    < > = values in this interval not displayed.   GFR: Estimated Creatinine Clearance: 52.2 mL/min (by C-G formula based on SCr of 0.91 mg/dL). Liver Function Tests: Recent Labs  Lab 10/08/19 1150  AST 19  ALT 14  ALKPHOS 66  BILITOT 1.1  PROT 7.6  ALBUMIN 4.0   No results for input(s): LIPASE, AMYLASE in the last 168 hours. No results for input(s): AMMONIA in the last 168 hours. Coagulation Profile: Recent Labs  Lab 10/10/19 0305 10/11/19 0411 10/12/19 0533 10/13/19 0526 10/14/19 0529  INR 3.5* 4.0* 2.8* 2.5* 1.7*   Cardiac Enzymes: No results for input(s): CKTOTAL, CKMB, CKMBINDEX, TROPONINI in the last 168 hours. BNP (last 3 results) No results for input(s): PROBNP in the last 8760 hours. HbA1C: No results for input(s): HGBA1C in the last 72 hours. CBG: No results for input(s): GLUCAP in the last 168 hours. Lipid Profile: No results for input(s): CHOL, HDL, LDLCALC, TRIG, CHOLHDL, LDLDIRECT in the last 72 hours. Thyroid Function Tests: No results for input(s): TSH, T4TOTAL, FREET4, T3FREE, THYROIDAB in the last 72 hours. Anemia Panel: No results for input(s): VITAMINB12, FOLATE, FERRITIN, TIBC, IRON, RETICCTPCT in the last 72 hours. Sepsis Labs: Recent Labs  Lab 10/08/19 1150 10/08/19 1416  PROCALCITON  --  <0.10  LATICACIDVEN 1.1 0.9    Recent Results (from the past 240 hour(s))  SARS Coronavirus 2 by RT PCR (hospital order, performed in [facility] hospital lab)  Nasopharyngeal Nasopharyngeal Swab     Status: None   Collection Time: 10/08/19 11:50 AM   Specimen: Nasopharyngeal Swab  Result Value Ref Range Status   SARS Coronavirus 2 NEGATIVE NEGATIVE Final    Comment: (NOTE) SARS-CoV-2 target nucleic acids are NOT DETECTED.  The SARS-CoV-2 RNA is generally detectable in upper and lower respiratory specimens during the acute phase of infection. The lowest concentration of SARS-CoV-2 viral copies this assay can detect is 250 copies / mL. A negative result does not preclude SARS-CoV-2 infection and should not be used as the sole basis for treatment or other patient management decisions.  A negative result may occur with improper specimen collection / handling, submission of specimen other than nasopharyngeal swab, presence of viral mutation(s) within the areas targeted by this assay, and inadequate number of viral copies (<250 copies / mL). A negative result must be combined with clinical observations, patient history, and epidemiological information.  Fact Sheet for Patients:   StrictlyIdeas.no  Fact Sheet for Healthcare Providers: BankingDealers.co.za  This test is not yet approved or  cleared by the [place] FDA and has been authorized for detection and/or diagnosis of SARS-CoV-2 by FDA under an Emergency Use Authorization (EUA).  This EUA will remain in effect (meaning this test can be used) for the duration of the COVID-19 declaration under Section 564(b)(1) of the Act, 21 U.S.C. section 360bbb-3(b)(1), unless the authorization is terminated or revoked sooner.  Performed at [facility], [address] [place], [place] [address]   CULTURE, BLOOD (ROUTINE X 2) w Reflex to ID Panel     Status: Abnormal   Collection Time: 10/08/19  3:45 PM   Specimen: BLOOD  Result Value  Ref Range Status   Specimen Description   Final    BLOOD RIGHT ANTECUBITAL Performed at [facility], [address] [place], [place] [address]    Special Requests   Final    BOTTLES DRAWN AEROBIC AND ANAEROBIC Blood Culture adequate volume Performed at [facility], [address] [place], [place] [address]    Culture  Setup Time   Final    GRAM POSITIVE COCCI IN BOTH AEROBIC AND ANAEROBIC BOTTLES CRITICAL RESULT CALLED TO, READ BACK BY AND VERIFIED WITH: Dorena Bodo AT 4627 10/09/19 SDR Performed at [facility], [address] [place], [place] [address]    Culture STAPHYLOCOCCUS CAPITIS (A)  Final   Report Status 10/12/2019 FINAL  Final   Organism ID, Bacteria STAPHYLOCOCCUS CAPITIS  Final      Susceptibility   Staphylococcus capitis - MIC*    CIPROFLOXACIN 4 RESISTANT Resistant     ERYTHROMYCIN <=0.25 SENSITIVE Sensitive     GENTAMICIN <=0.5 SENSITIVE Sensitive     OXACILLIN <=0.25 SENSITIVE Sensitive     TETRACYCLINE <=1 SENSITIVE Sensitive     VANCOMYCIN 1 SENSITIVE Sensitive     TRIMETH/SULFA <=10 SENSITIVE Sensitive     CLINDAMYCIN <=0.25 SENSITIVE Sensitive     RIFAMPIN <=0.5 SENSITIVE Sensitive     Inducible Clindamycin NEGATIVE Sensitive     * STAPHYLOCOCCUS CAPITIS  CULTURE, BLOOD (ROUTINE X 2) w Reflex to ID Panel     Status: Abnormal   Collection Time: 10/08/19  3:45 PM   Specimen: BLOOD  Result Value Ref Range Status   Specimen Description   Final    BLOOD LEFT ANTECUBITAL Performed at [facility], [address] [place], [place] [address]    Special Requests   Final    BOTTLES DRAWN AEROBIC AND ANAEROBIC Blood Culture adequate volume Performed at [facility], [address] [place], [place] [address]    Culture  Setup Time   Final    GRAM POSITIVE COCCI IN BOTH AEROBIC AND ANAEROBIC BOTTLES CRITICAL VALUE NOTED.  VALUE IS CONSISTENT WITH PREVIOUSLY REPORTED AND CALLED VALUE. Performed at [facility], [address] [place], [place] [address]    Culture (A)  Final    STAPHYLOCOCCUS  CAPITIS SUSCEPTIBILITIES PERFORMED ON PREVIOUS CULTURE WITHIN THE LAST 5 DAYS. Performed at [facility], [place] [address], [place],  [address]    Report Status 10/12/2019 FINAL  Final  Blood Culture ID Panel (Reflexed)     Status: Abnormal   Collection Time: 10/08/19  3:45 PM  Result Value Ref Range Status   Enterococcus faecalis NOT DETECTED NOT DETECTED Final   Enterococcus Faecium NOT DETECTED NOT DETECTED Final   Listeria monocytogenes NOT DETECTED NOT DETECTED Final   Staphylococcus species DETECTED (A) NOT DETECTED Final    Comment: CRITICAL RESULT CALLED TO, READ BACK BY AND VERIFIED WITH:  ABBY ELLINGTON AT 0907 10/09/19 SDR    Staphylococcus aureus (BCID) NOT DETECTED NOT DETECTED Final   Staphylococcus epidermidis NOT DETECTED NOT DETECTED Final   Staphylococcus lugdunensis NOT DETECTED NOT DETECTED Final   Streptococcus species NOT DETECTED NOT DETECTED Final   Streptococcus agalactiae NOT DETECTED NOT DETECTED Final   Streptococcus pneumoniae NOT DETECTED NOT DETECTED Final   Streptococcus pyogenes NOT DETECTED NOT DETECTED Final   A.calcoaceticus-baumannii NOT DETECTED NOT DETECTED Final   Bacteroides fragilis NOT DETECTED NOT DETECTED Final   Enterobacterales NOT DETECTED NOT DETECTED Final   Enterobacter cloacae complex NOT DETECTED  NOT DETECTED Final   Escherichia coli NOT DETECTED NOT DETECTED Final   Klebsiella aerogenes NOT DETECTED NOT DETECTED Final   Klebsiella oxytoca NOT DETECTED NOT DETECTED Final   Klebsiella pneumoniae NOT DETECTED NOT DETECTED Final   Proteus species NOT DETECTED NOT DETECTED Final   Salmonella species NOT DETECTED NOT DETECTED Final   Serratia marcescens NOT DETECTED NOT DETECTED Final   Haemophilus influenzae NOT DETECTED NOT DETECTED Final   Neisseria meningitidis NOT DETECTED NOT DETECTED Final   Pseudomonas aeruginosa NOT DETECTED NOT DETECTED Final   Stenotrophomonas maltophilia NOT DETECTED NOT DETECTED Final    Candida albicans NOT DETECTED NOT DETECTED Final   Candida auris NOT DETECTED NOT DETECTED Final   Candida glabrata NOT DETECTED NOT DETECTED Final   Candida krusei NOT DETECTED NOT DETECTED Final   Candida parapsilosis NOT DETECTED NOT DETECTED Final   Candida tropicalis NOT DETECTED NOT DETECTED Final   Cryptococcus neoformans/gattii NOT DETECTED NOT DETECTED Final    Comment: Performed at [facility], [address] [place], [place] [address]  CULTURE, BLOOD (ROUTINE X 2) w Reflex to ID Panel     Status: None (Preliminary result)   Collection Time: 10/11/19  2:23 PM   Specimen: BLOOD  Result Value Ref Range Status   Specimen Description BLOOD BLOOD RIGHT FOREARM  Final   Special Requests   Final    BOTTLES DRAWN AEROBIC AND ANAEROBIC Blood Culture adequate volume   Culture   Final    NO GROWTH 3 DAYS Performed at [facility], [address] [place], [place] [address]    Report Status PENDING  Incomplete  CULTURE, BLOOD (ROUTINE X 2) w Reflex to ID Panel     Status: None (Preliminary result)   Collection Time: 10/11/19  2:29 PM   Specimen: BLOOD  Result Value Ref Range Status   Specimen Description BLOOD BLOOD RIGHT HAND  Final   Special Requests   Final    BOTTLES DRAWN AEROBIC AND ANAEROBIC Blood Culture adequate volume   Culture   Final    NO GROWTH 3 DAYS Performed at [facility], [address], [place], [place] [address]    Report Status PENDING  Incomplete      Imaging Studies   No results found.   Medications   Scheduled Meds: . amoxicillin-clavulanate  1 tablet Oral Q12H  . atorvastatin  20 mg Oral Daily  . azithromycin  250 mg Oral Daily  . calcium carbonate  1 tablet Oral BID WC  . dextromethorphan-guaiFENesin  1 tablet Oral BID  . diltiazem  300 mg Oral Daily  . furosemide  20 mg Oral Daily  . influenza vaccine adjuvanted  0.5 mL Intramuscular Once  . ipratropium-albuterol  3 mL Nebulization TID  . lidocaine  1 patch  Transdermal Q24H  . lisinopril  2.5 mg Oral Daily  . loratadine  10 mg Oral Daily  . mouth rinse  15 mL Mouth Rinse BID  . metoprolol tartrate  50 mg Oral BID  . [START ON 10/15/2019] predniSONE  10 mg Oral Q breakfast  . sodium chloride flush  3 mL Intravenous Q12H  . Vitamin D (Ergocalciferol)  50,000 Units Oral Q7 days  . warfarin  2 mg Oral ONCE-1600  . Warfarin - Pharmacist Dosing Inpatient   Does not apply q1600   Continuous Infusions: . sodium chloride Stopped (10/10/19 1043)       LOS: 6 days    Time spent: 30 minutes with >  50% spent in coordinating care and direct patient contact    Desma Maxim, MD [facility]  10/14/2019, 4:10 PM    If 7PM-7AM, please contact night-coverage. How to contact the [facility] Attending or Consulting provider [place] or covering provider during after hours [place], for this patient?    1. Check the care team in [facility] and look for a) attending/consulting [facility] provider listed and b) the [facility] team listed 2. Log into [email or web address] and use [place]'s universal password to access. If you do not have the password, please contact the hospital operator. 3. Locate the [facility] provider you are looking for under [facility] and page to a number that you can be directly reached. 4. If you still have difficulty reaching the provider, please page the [facility] (Director on Call) for the Hospitalists listed on amion for assistance.

## 2019-10-15 ENCOUNTER — Telehealth (HOSPITAL_COMMUNITY): Payer: Self-pay | Admitting: Licensed Clinical Social Worker

## 2019-10-15 LAB — BASIC METABOLIC PANEL
Anion gap: 9 (ref 5–15)
BUN: 39 mg/dL — ABNORMAL HIGH (ref 8–23)
CO2: 32 mmol/L (ref 22–32)
Calcium: 9.2 mg/dL (ref 8.9–10.3)
Chloride: 101 mmol/L (ref 98–111)
Creatinine, Ser: 1.05 mg/dL (ref 0.61–1.24)
GFR calc Af Amer: >60 mL/min (ref >60)
GFR calc non Af Amer: >60 mL/min (ref >60)
Glucose, Bld: 99 mg/dL (ref 70–99)
Potassium: 3.8 mmol/L (ref 3.5–5.1)
Sodium: 142 mmol/L (ref 135–145)

## 2019-10-15 LAB — CBC
HCT: 32.6 % — ABNORMAL LOW (ref 39.0–52.0)
Hemoglobin: 10.5 g/dL — ABNORMAL LOW (ref 13.0–17.0)
MCH: 29.4 pg (ref 26.0–34.0)
MCHC: 32.2 g/dL (ref 30.0–36.0)
MCV: 91.3 fL (ref 80.0–100.0)
Platelets: 216 10*3/uL (ref 150–400)
RBC: 3.57 MIL/uL — ABNORMAL LOW (ref 4.22–5.81)
RDW: 15.1 % (ref 11.5–15.5)
WBC: 11.7 10*3/uL — ABNORMAL HIGH (ref 4.0–10.5)
nRBC: 0 % (ref 0.0–0.2)

## 2019-10-15 LAB — PROTIME-INR
INR: 1.4 — ABNORMAL HIGH (ref 0.8–1.2)
Prothrombin Time: 16.9 seconds — ABNORMAL HIGH (ref 11.4–15.2)

## 2019-10-15 MED ORDER — LIDOCAINE 5 % EX PTCH: 1.0000 | MEDICATED_PATCH | CUTANEOUS | 0 refills | Status: DC

## 2019-10-15 MED ORDER — VITAMIN D (ERGOCALCIFEROL) 1.25 MG (50000 UNIT) PO CAPS: 50000.0000 [IU] | ORAL_CAPSULE | ORAL | 0 refills | Status: DC

## 2019-10-15 NOTE — Progress Notes (Signed)
OT Cancellation Note  Patient Details Name: [name] MRN: [id number] DOB: [birth year]   Cancelled Treatment:    Reason Eval/Treat Not Completed: Other. Arrived at pt's room for OT session, but NT was in room, ready to transport pt downstairs for D/C home.   Josiah Lobo, PhD, [place], OTR/L ascom [phone] 10/15/19, 10:49 AM

## 2019-10-15 NOTE — Progress Notes (Addendum)
   10/15/19 0749  Vitals  Temp 99.2 F (37.3 C)  Temp Source Oral  BP (!) 157/79  MAP (mmHg) 99  BP Location Left Arm  BP Method Automatic  Patient Position (if appropriate) Lying  Pulse Rate (!) 108  Resp 17  MEWS COLOR  MEWS Score Color Green  Oxygen Therapy  SpO2 95 %  O2 Device Nasal Cannula  O2 Flow Rate (L/min) 3 L/min  MEWS Score  MEWS Temp 0  MEWS Systolic 0  MEWS Pulse 1  MEWS RR 0  MEWS LOC 0  MEWS Score 1  Patient received discharge instructions from this RN this morning. He is dressed with belongings at bedside. Daughter will be here to pick up at 1030.

## 2019-10-15 NOTE — Discharge Summary (Signed)
[name] PPJ:[id number] DOB: 1928/03/07 DOA: 10/08/2019  PCP: Baxter Hire, MD  Admit date: 10/08/2019 Discharge date: 10/15/2019  Time spent: 35 minutes  Recommendations for Outpatient Follow-up:  1. PCP f/u within 1 week (will need INR check) 2. Pulmonary follow-up within 2 weeks  3. CHF clinic f/u (scheduled)   Discharge Diagnoses:  Principal Problem:   Acute on chronic respiratory failure with hypoxia (HCC) Active Problems:   Hypertension   Coronary artery disease   HLD (hyperlipidemia)   Atrial fibrillation, chronic (HCC)   Acute on chronic diastolic CHF (congestive heart failure) (HCC)   COPD exacerbation (HCC)   Aspiration pneumonia (HCC)   Sepsis ([place])   Acute on chronic diastolic (congestive) heart failure ([place])   Discharge Condition: fair  Diet recommendation: heart healthy  Filed Weights   10/09/19 0454 10/10/19 0529 10/13/19 0505  Weight: 62.5 kg 63.3 kg 67 kg    History of present illness:  Christopher L Stanfieldis a 86 y.o.malewith medical history ofhypertension, hyperlipidemia, COPD, on 2 L/min oxygen at home, CHF, CAD, atrial fibrillation on Coumadin, COVID-19 infection, who presented to ED on 10/08/19 with progressive shortness of breath, wet sounding cough but without sputum production, mid-anterior chest wall pain, and O2 desats to low 80's on his usual 2 L/min.    No fever or chills. Onset of his pain occurred about a week ago, acutely when he got up from seated to pull a chair towards himself to put his legs up, as he always does.  He reported feeling "something pulling from my tummy up to my chest".  Evaluation in the ED - BNP elevated 1004, troponin and lactate normal, leukocytosis WBC 12.8k, negative Covid-19 PCR.  BP uncontrolled 202/66 >> 176/84, afebrile, HR 99, tachypneic RR 28.  Hospital Course:  Acute on chronic respiratory failure with hypoxia - POA with progressive shortness of breath and increased oxygen requirement of 3-5 l/min  from baseline 2 l/min.   Multifactorial likely aspiration pneumonia/pneumonitis, and some elements of both dCHF (edema, positive JVD, BNP 1004) and mild COPD exacerbation with wheezing. --supplemental O2, maintained O2 sat > 90%. Now weaned to 3 L   Severe sepsis secondary to Aspiration pneumonia- sepsis present on admission as evidenced by tachycardia, tachypnea, leukocytosis, worsened hypoxia reflective of organ dysfunction.  Lactic acid normal. Improving  --stop unasyn (9/10-9/15), started augmentin 9/15, discontinued 9/17 day of discharge --Prelim blood culture growing staph capitis, methicillin sensitive.  Possibly contaminate but grew in both sets. Per previous provider discussion w/ ID, if second set of cultures negative can consider contaminant, that second set negative x2 so do think contaminant Patient denies having any implants or hardware in his body.    Acute on chronic diastolic CHF / Valvular heart disease -  Presented with worsened hypoxia, elevated BNP, JVD, edema, pleural effusions, shortness of breath, all consistent with CHF.   Echo in 12/2018 showed EF of 58-09%, grade 1 diastolic dysfunction, mild LVH.   Echo on 9/12: EF 55-60%, grade 1 dd, signs of RV volume overload, moderate PASP elevation, severely dilated RA, mod-severe TR, mild MR. --Developed AKI with IV diuresis, has resolved --resumed home Lasix 20 mg daily, tolerating --Low sodium diet, fluid restriction -- heart failure clinic f/u scheduled  Acute Kidney Injury - Not present on admission, has resolved.  On day 2, Cr bumped 1.04>>1.37, likely due to diuresis.    COPD with mild acute exacerbation - presented with wheezing, worsened hypoxia, improved with bronchodilators, all clinically consistent with COPD.  Clinically improving, o2 requirement 4-5 L still above baseline of 2 --Scheduled Duonebs q6h, PRN albuterol nebs -- abx as above --Reduce Prednisone to 30 mg & tapered down, will resume home low dose  prednisone, close Pulmonary f/u  Sternal body fracture - as shown by CT-chest on admission.  Given history provided of acute onset pain in chest/abdomen, tenderness on palpation, and several compression fractures, suspect fragility fracture. --Tylenol, Percocet PRN --Lidocaine patch, pt thinked helped a lot, will rx for home use  Multiple Thoracic and Lumbar Compression Fractures - see imaging reports for details.  Involving T5, T7 (high grade), L2 (high grade).  Patient denies back pain and has no palpable midline tenderness along the entirety of his back.   MRI of T and L spine - T5 and T7 are acute to subacute, T3 and T4 subacute to chronic and more mild, L1 through L5 are chronic. --likely fragility fractures, which is diagnostic of osteoporosis  Osteoporosis - as evidenced by several compression fractures, sternal fracture without trauma.   Will need outpatient follow up for ongoing management.  Started on calcium supplement, vit D as below.  Vitamin D Deficiency - vitamin D level 16.8 on 9/12.   --Start vitamin D 50k units weekly.  Follow up outpatient.  Hypertension - chonric, uncontrolled on admission.  Continue Cardia, metoprolol, metoprolol, recently started on low dose lisinopril for CHF.    Coronary artery disease Hyperlipidemia --Continue Lipitor  Atrial fibrillation, chronic - currently rate controlled.  Continue metoprolol.  Warfarin per pharmacy.  Monitor INR. INR subtherapeutic 1.4 on DOD - resume home warfarin 2 mg po qd, advised pcp f/u within 1 week for repeat  Procedures:  Echocardiogram 1. Left ventricular ejection fraction, by estimation, is 55 to 60%. The  left ventricle has normal function. The left ventricle has no regional  wall motion abnormalities. Left ventricular diastolic parameters are  consistent with Grade I diastolic  dysfunction (impaired relaxation). There is the interventricular septum is  flattened in diastole ('D' shaped left  ventricle), consistent with right  ventricular volume overload.  2. Right ventricular systolic function is moderately reduced. The right  ventricular size is moderately enlarged. There is moderately elevated  pulmonary artery systolic pressure.  3. Left atrial size was mildly dilated.  4. Right atrial size was severely dilated.  5. The mitral valve is abnormal. Mild mitral valve regurgitation.  6. Tricuspid valve regurgitation is moderate to severe.  7. The aortic valve is calcified. Aortic valve regurgitation is mild.  Mild to moderate aortic valve sclerosis/calcification is present, without  any evidence of aortic stenosis.   Consultations:  none  Discharge Exam: Vitals:   10/14/19 2025 10/15/19 0415  BP:  (!) 159/84  Pulse:  77  Resp:  20  Temp:  97.8 F (36.6 C)  SpO2: 94% 96%    General exam: awake, alert, no acute distress, frail, pleasant Respiratory system: normal respiratory effort, on 3 L/min oxygen, faint rales at bases, otherwise clear Cardiovascular system: normal S1/S2, RRR, no peripheral edema.   Gastrointestinal system: soft, NT, ND, +bowel sounds Central nervous system: A&O x3. no gross focal neurologic deficits, normal speech  Discharge Instructions   Discharge Instructions    Call MD for:  difficulty breathing, headache or visual disturbances   Complete by: As directed    Call MD for:  extreme fatigue   Complete by: As directed    Call MD for:  persistant dizziness or light-headedness   Complete by: As directed  Call MD for:  persistant nausea and vomiting   Complete by: As directed    Call MD for:  redness, tenderness, or signs of infection (pain, swelling, redness, odor or green/yellow discharge around incision site)   Complete by: As directed    Call MD for:  temperature >100.4   Complete by: As directed    Diet - low sodium heart healthy   Complete by: As directed    Discharge instructions   Complete by: As directed    [place] - WITH YOUR PULMONOLOGIST (LUNG SPECIALIST) WITHIN THE NEXT 2 WEEKS - WITH YOUR PRIMARY CARE PROVIDER WITHIN ONE WEEK (YOU WILL NEED YOUR INR CHECKED)   Increase activity slowly   Complete by: As directed      Allergies as of 10/15/2019   No Known Allergies     Medication List    TAKE these medications   albuterol 108 (90 Base) MCG/ACT inhaler Commonly known as: VENTOLIN HFA Inhale into the lungs every 6 (six) hours as needed for wheezing or shortness of breath.   albuterol (2.5 MG/3ML) 0.083% nebulizer solution Commonly known as: PROVENTIL Take 3 mLs (2.5 mg total) by nebulization every 6 (six) hours as needed for wheezing or shortness of breath.   atorvastatin 20 MG tablet Commonly known as: LIPITOR Take 20 mg by mouth daily.   Cartia XT 300 MG 24 hr capsule Generic drug: diltiazem Take 300 mg by mouth daily.   fexofenadine 180 MG tablet Commonly known as: ALLEGRA Take 180 mg by mouth daily.   furosemide 20 MG tablet Commonly known as: LASIX TAKE 1 TABLET BY MOUTH DAILY   lidocaine 5 % Commonly known as: LIDODERM Place 1 patch onto the skin daily. Remove & Discard patch within 12 hours or as directed by MD   metoprolol tartrate 50 MG tablet Commonly known as: LOPRESSOR Take 50 mg by mouth 2 (two) times daily.   mometasone 50 MCG/ACT nasal spray Commonly known as: NASONEX Place 1 spray into the nose daily. Each nostril   potassium chloride 10 MEQ tablet Commonly known as: KLOR-CON Take 10 mEq by mouth daily.   predniSONE 10 MG tablet Commonly known as: DELTASONE Take 5 mg by mouth daily with breakfast.   traZODone 50 MG tablet Commonly known as: DESYREL Take 50 mg by mouth at bedtime as needed for sleep.   Trelegy Ellipta 100-62.5-25 MCG/INH Aepb Generic drug: Fluticasone-Umeclidin-Vilant Inhale 1 puff into the lungs daily.   warfarin 2 MG tablet Commonly known as: COUMADIN Take 2 mg by mouth daily. What  changed: Another medication with the same name was removed. Continue taking this medication, and follow the directions you see here.      No Known Allergies  Follow-up Information    [facility] Follow up on 10/27/2019.   Specialty: Cardiology Why: at 11:00am. Enter through the [place] entrance Contact information: [place] [address] [place] [phone]       Your primary care provider Follow up.   Why: within 1 week for check of your INR       Your pulmonologist (lung doctor) Follow up.   Why: within 2 weeks if possible               The results of significant diagnostics from this hospitalization (including imaging, microbiology, ancillary and laboratory) are listed below for reference.    Significant Diagnostic Studies: CT Chest Wo  Contrast  Result Date: 10/08/2019 CLINICAL DATA:  Abnormal chest radiograph, possible left upper lobe mass EXAM: CT CHEST WITHOUT CONTRAST TECHNIQUE: Multidetector CT imaging of the chest was performed following the standard protocol without IV contrast. COMPARISON:  Same day chest radiograph, CT chest, 11/29/2015 FINDINGS: Cardiovascular: Aortic atherosclerosis. Aortic valve calcifications. Enlargement of the proximal descending thoracic aorta, measuring up to 3.6 x 3.3 cm, slightly enlarged compared to prior examination at which time it measured 3.2 x 3.2 cm. Normal heart size. Extensive 3 vessel coronary artery calcifications and/or stents. No pericardial effusion. Mediastinum/Nodes: No enlarged mediastinal, hilar, or axillary lymph nodes. Thyroid gland, trachea, and esophagus demonstrate no significant findings. Lungs/Pleura: Moderate to severe centrilobular and paraseptal emphysema. There is redemonstrated partially calcified biapical pleuroparenchymal scarring, which is not significantly changed compared to prior examination dated 11/29/2015. There is bronchial  plugging in the dependent left lower lobe (series 3, image 78). There is minimal dependent bibasilar atelectasis and/or consolidation and trace pleural effusions. Upper Abdomen: No acute abnormality. Musculoskeletal: Osteopenia. There is a buckled fracture deformity of the mid sternal body with overlying soft tissue edema (series 6, image 88). There are multiple vertebral body wedge and endplate deformities, several which are new compared to prior examination dated 2017, particularly a high-grade wedge deformity of the T7 vertebral body, a more subtle superior endplate deformity of the T5 vertebral body, and an increased, now high-grade wedge deformity of the L2 vertebral body. IMPRESSION: 1. There is redemonstrated partially calcified biapical pleuroparenchymal scarring, which is not significantly changed compared to prior examination dated 2017 and accounts for radiographic abnormality. No evidence of mass. 2. Bronchial plugging in the dependent left lower lobe. Minimal dependent bibasilar atelectasis and/or consolidation and trace pleural effusions. Findings suggest aspiration. 3. There is a buckled fracture deformity of the mid sternal body with overlying soft tissue edema, new compared to prior examination. Correlate for recent injury an acute point tenderness. 4. There are multiple vertebral body wedge and endplate deformities, several which are new compared to prior examination dated 2017, particularly a high-grade wedge deformity of the T7 vertebral body, a more subtle superior endplate deformity of the T5 vertebral body, and an increased, now high-grade wedge deformity of the L2 vertebral body. These are of uncertain acuity. Correlate for acute point tenderness. MRI may be used to assess for marrow edema and fracture acuity if desired. 5. Moderate to severe emphysema. Emphysema (ICD10-J43.9). 6. Coronary artery disease. 7. Aortic Atherosclerosis (ICD10-I70.0). Aortic valve calcifications. Enlargement of the  proximal descending thoracic aorta, measuring up to 3.6 x 3.3 cm, slightly enlarged compared to prior examination at which time it measured 3.2 x 3.2 cm. Recommend annual imaging followup by CTA or MRA if clinically appropriate. This recommendation follows 2010 ACCF/AHA/AATS/ACR/ASA/SCA/SCAI/SIR/STS/SVM Guidelines for the Diagnosis and Management of Patients with Thoracic Aortic Disease. Circulation.2010; 121: J696-V893. Aortic aneurysm NOS (ICD10-I71.9) Electronically Signed   By: Eddie Candle M.D.   On: 10/08/2019 13:20   MR THORACIC SPINE WO CONTRAST  Result Date: 10/09/2019 CLINICAL DATA:  Initial evaluation for compression fractures. EXAM: MRI THORACIC AND LUMBAR SPINE WITHOUT CONTRAST TECHNIQUE: Multiplanar and multiecho pulse sequences of the thoracic and lumbar spine were obtained without intravenous contrast. COMPARISON:  Prior CT from 10/08/2019. FINDINGS: MRI THORACIC SPINE FINDINGS Alignment: Exaggeration of the normal thoracic kyphosis with focal kyphotic angulation about a T7 compression fracture. Underlying trace scoliosis. No listhesis. Vertebrae: Compression deformities about the superior endplates of T3 and T4 with mild 20% height loss without significant bony retropulsion. Trace  residual marrow edema seen about these fractures, suggesting that these are late subacute to chronic in nature. Additional compression fracture of T5 with up to 35% height loss without significant bony retropulsion. Residual linear marrow edema consistent with an acute to subacute fracture (series 21, image 12). Severe compression deformity with near complete anterior height loss of T7 with associated 3 mm bony retropulsion also demonstrates some residual marrow edema, suggesting an acute to subacute component. Otherwise, vertebral body height fairly well maintained, with no other acute or chronic compression fracture. Underlying bone marrow signal intensity within normal limits. No worrisome osseous lesions. No other  abnormal marrow edema. Cord:  Normal signal and morphology. Paraspinal and other soft tissues: Paraspinous soft tissues demonstrate no acute finding. Layering bilateral pleural effusions. Irregular emphysematous changes partially visualize, better evaluated on prior chest CT. Few scattered simple cysts noted about the visualized kidneys. Aortic atherosclerosis noted. Disc levels: T7-8: 3 mm bony retropulsion related to the T7 compression fracture. Secondary flattening of the ventral thecal sac with mild flattening of the ventral spinal cord, but no more than mild spinal stenosis. Otherwise, normal expected for age degenerative disc disease elsewhere within the thoracic spine. Mild scattered multilevel facet hypertrophy. No other significant canal or foraminal stenosis. MRI LUMBAR SPINE FINDINGS Segmentation:  Standard. Alignment: Trace anterolisthesis of L1 on L2, with trace retrolisthesis of L2 on L3. Trace anterolisthesis of L4 on L5. Findings chronic and facet mediated. Vertebrae: Compression fracture of L1 with up to 60% height loss and 3 mm bony retropulsion. Compression fracture of L2 with severe 80% height loss and 3 mm bony retropulsion. Biconcave compression fracture of L3 with mild 25% central height loss without bony retropulsion. Compression fracture of the superior endplate of L4 with mild 35% height loss without bony retropulsion. Compression fracture of the superior endplate of L5 with associated 35% height loss with trace 2 mm bony retropulsion. These fractures are chronic in appearance, with no residual marrow edema. Underlying bone marrow signal intensity within normal limits. No worrisome osseous lesions. Conus medullaris and cauda equina: Conus extends to the L1 level. Conus and cauda equina appear normal. Paraspinal and other soft tissues: Paraspinous soft tissues demonstrate no acute finding. Few scattered cysts noted about the kidneys bilaterally. Aortic atherosclerosis. Disc levels: L1-2:  Diffuse disc bulge with disc desiccation, with superimposed 3 mm bony retropulsion related to the L2 compression fracture. Mild facet hypertrophy. No significant spinal stenosis. Mild bilateral L1 foraminal narrowing. No impingement. L2-3: Mild diffuse disc bulge with disc desiccation. Mild facet hypertrophy. No significant spinal stenosis. Mild right L2 foraminal narrowing. No significant left foraminal encroachment. No impingement. L3-4: Mild diffuse disc bulge with disc desiccation and intervertebral disc space narrowing. Mild facet hypertrophy. No significant spinal stenosis. Foramina remain patent. L4-5: Mild diffuse disc bulge with disc desiccation and intervertebral disc space narrowing. Superimposed broad-based central to left foraminal disc protrusion flattens the left ventral thecal sac (series 4, image 25). Mild facet hypertrophy. Resultant mild to moderate left lateral recess stenosis. Central canal remains patent. Mild left L4 foraminal narrowing. No significant right foraminal stenosis. L5-S1: Disc desiccation with minimal disc bulge. Mild facet hypertrophy. No significant canal or foraminal stenosis. IMPRESSION: MR THORACIC SPINE IMPRESSION 1. Severe compression deformity of T7 with associated 3 mm bony retropulsion and mild spinal stenosis. Associated residual marrow edema suggests an acute to subacute component. 2. Acute to subacute compression fracture of T5 with up to 35% height loss without bony retropulsion. 3. Mild compression fractures of T3 and  T4 with up to 20% height loss. No more than trace residual marrow edema, suggesting at these are late subacute to chronic. 4. Layering bilateral pleural effusions, increased from prior CT. MR LUMBAR SPINE IMPRESSION 1. Multiple compression fractures involving the L1 through L5 vertebral bodies as above, all of which are chronic in nature without residual marrow edema. No acute fracture within the lumbar spine. 2. Underlying multilevel lumbar  spondylosis, most pronounced at L4-5 were there is resultant mild to moderate left lateral recess stenosis, descending left L5 nerve root level. No other significant stenosis or neural impingement. Electronically Signed   By: Jeannine Boga M.D.   On: 10/09/2019 03:08   MR LUMBAR SPINE WO CONTRAST  Result Date: 10/09/2019 CLINICAL DATA:  Initial evaluation for compression fractures. EXAM: MRI THORACIC AND LUMBAR SPINE WITHOUT CONTRAST TECHNIQUE: Multiplanar and multiecho pulse sequences of the thoracic and lumbar spine were obtained without intravenous contrast. COMPARISON:  Prior CT from 10/08/2019. FINDINGS: MRI THORACIC SPINE FINDINGS Alignment: Exaggeration of the normal thoracic kyphosis with focal kyphotic angulation about a T7 compression fracture. Underlying trace scoliosis. No listhesis. Vertebrae: Compression deformities about the superior endplates of T3 and T4 with mild 20% height loss without significant bony retropulsion. Trace residual marrow edema seen about these fractures, suggesting that these are late subacute to chronic in nature. Additional compression fracture of T5 with up to 35% height loss without significant bony retropulsion. Residual linear marrow edema consistent with an acute to subacute fracture (series 21, image 12). Severe compression deformity with near complete anterior height loss of T7 with associated 3 mm bony retropulsion also demonstrates some residual marrow edema, suggesting an acute to subacute component. Otherwise, vertebral body height fairly well maintained, with no other acute or chronic compression fracture. Underlying bone marrow signal intensity within normal limits. No worrisome osseous lesions. No other abnormal marrow edema. Cord:  Normal signal and morphology. Paraspinal and other soft tissues: Paraspinous soft tissues demonstrate no acute finding. Layering bilateral pleural effusions. Irregular emphysematous changes partially visualize, better  evaluated on prior chest CT. Few scattered simple cysts noted about the visualized kidneys. Aortic atherosclerosis noted. Disc levels: T7-8: 3 mm bony retropulsion related to the T7 compression fracture. Secondary flattening of the ventral thecal sac with mild flattening of the ventral spinal cord, but no more than mild spinal stenosis. Otherwise, normal expected for age degenerative disc disease elsewhere within the thoracic spine. Mild scattered multilevel facet hypertrophy. No other significant canal or foraminal stenosis. MRI LUMBAR SPINE FINDINGS Segmentation:  Standard. Alignment: Trace anterolisthesis of L1 on L2, with trace retrolisthesis of L2 on L3. Trace anterolisthesis of L4 on L5. Findings chronic and facet mediated. Vertebrae: Compression fracture of L1 with up to 60% height loss and 3 mm bony retropulsion. Compression fracture of L2 with severe 80% height loss and 3 mm bony retropulsion. Biconcave compression fracture of L3 with mild 25% central height loss without bony retropulsion. Compression fracture of the superior endplate of L4 with mild 35% height loss without bony retropulsion. Compression fracture of the superior endplate of L5 with associated 35% height loss with trace 2 mm bony retropulsion. These fractures are chronic in appearance, with no residual marrow edema. Underlying bone marrow signal intensity within normal limits. No worrisome osseous lesions. Conus medullaris and cauda equina: Conus extends to the L1 level. Conus and cauda equina appear normal. Paraspinal and other soft tissues: Paraspinous soft tissues demonstrate no acute finding. Few scattered cysts noted about the kidneys bilaterally. Aortic atherosclerosis. Disc levels:  L1-2: Diffuse disc bulge with disc desiccation, with superimposed 3 mm bony retropulsion related to the L2 compression fracture. Mild facet hypertrophy. No significant spinal stenosis. Mild bilateral L1 foraminal narrowing. No impingement. L2-3: Mild  diffuse disc bulge with disc desiccation. Mild facet hypertrophy. No significant spinal stenosis. Mild right L2 foraminal narrowing. No significant left foraminal encroachment. No impingement. L3-4: Mild diffuse disc bulge with disc desiccation and intervertebral disc space narrowing. Mild facet hypertrophy. No significant spinal stenosis. Foramina remain patent. L4-5: Mild diffuse disc bulge with disc desiccation and intervertebral disc space narrowing. Superimposed broad-based central to left foraminal disc protrusion flattens the left ventral thecal sac (series 4, image 25). Mild facet hypertrophy. Resultant mild to moderate left lateral recess stenosis. Central canal remains patent. Mild left L4 foraminal narrowing. No significant right foraminal stenosis. L5-S1: Disc desiccation with minimal disc bulge. Mild facet hypertrophy. No significant canal or foraminal stenosis. IMPRESSION: MR THORACIC SPINE IMPRESSION 1. Severe compression deformity of T7 with associated 3 mm bony retropulsion and mild spinal stenosis. Associated residual marrow edema suggests an acute to subacute component. 2. Acute to subacute compression fracture of T5 with up to 35% height loss without bony retropulsion. 3. Mild compression fractures of T3 and T4 with up to 20% height loss. No more than trace residual marrow edema, suggesting at these are late subacute to chronic. 4. Layering bilateral pleural effusions, increased from prior CT. MR LUMBAR SPINE IMPRESSION 1. Multiple compression fractures involving the L1 through L5 vertebral bodies as above, all of which are chronic in nature without residual marrow edema. No acute fracture within the lumbar spine. 2. Underlying multilevel lumbar spondylosis, most pronounced at L4-5 were there is resultant mild to moderate left lateral recess stenosis, descending left L5 nerve root level. No other significant stenosis or neural impingement. Electronically Signed   By: Jeannine Boga M.D.    On: 10/09/2019 03:08   DG Chest Port 1 View  Result Date: 10/12/2019 CLINICAL DATA:  Acute on chronic respiratory failure EXAM: PORTABLE CHEST 1 VIEW COMPARISON:  10/08/2019, 01/24/2019, 02/01/2014 CT 10/08/2019 FINDINGS: Hyperinflation with emphysematous disease. Biapical pleural and parenchymal scarring. Cardiomegaly with enlarged central pulmonary vessels. Suspected trace left pleural effusion. Streaky atelectasis at the left base. Aortic atherosclerosis. No pneumothorax. IMPRESSION: 1. Cardiomegaly with small left pleural effusion and streaky atelectasis at the left base. 2. Emphysematous disease. 3. Enlarged appearing central pulmonary vessels suggesting arterial hypertension. Electronically Signed   By: Donavan Foil M.D.   On: 10/12/2019 02:17   DG Chest Portable 1 View  Result Date: 10/08/2019 CLINICAL DATA:  Increasing shortness of breath and cough with left-sided chest pain EXAM: PORTABLE CHEST 1 VIEW COMPARISON:  04/05/2019 FINDINGS: Cardiac shadow is stable. Aortic calcifications are again seen. Biapical pleuroparenchymal scarring is noted. Some increasing density is noted in the left apex likely related to increasing scarring although the possibility of evolving mass or infiltrate could not be totally excluded. No sizable effusion is noted. No acute bony abnormality is seen. IMPRESSION: Increasing rounded density in the left apex when compared with the prior exam. The possibility of evolving infiltrate/mass could not be totally excluded. CT of the chest is recommended for further evaluation. Electronically Signed   By: Inez Catalina M.D.   On: 10/08/2019 12:17   ECHOCARDIOGRAM COMPLETE  Result Date: 10/10/2019    ECHOCARDIOGRAM REPORT   Patient Name:   [name] Date of Exam: 10/10/2019 Medical Rec #:  [id number]         Height:  70.0 in Accession #:    [id number]        Weight:       139.6 lb Date of Birth:  Aug 08, 1928         BSA:          1.791 m Patient Age:    53 years           BP:           126/57 mmHg Patient Gender: M                 HR:           74 bpm. Exam Location:  [facility] Procedure: 2D Echo Indications:     CHF I50.31  History:         Patient has prior history of Echocardiogram examinations, most                  recent 01/06/2019.  Sonographer:     Arville Go RDCS Referring Phys:  Unknown Foley NIU Diagnosing Phys: Yolonda Kida MD IMPRESSIONS  1. Left ventricular ejection fraction, by estimation, is 55 to 60%. The left ventricle has normal function. The left ventricle has no regional wall motion abnormalities. Left ventricular diastolic parameters are consistent with Grade I diastolic dysfunction (impaired relaxation). There is the interventricular septum is flattened in diastole ('D' shaped left ventricle), consistent with right ventricular volume overload.  2. Right ventricular systolic function is moderately reduced. The right ventricular size is moderately enlarged. There is moderately elevated pulmonary artery systolic pressure.  3. Left atrial size was mildly dilated.  4. Right atrial size was severely dilated.  5. The mitral valve is abnormal. Mild mitral valve regurgitation.  6. Tricuspid valve regurgitation is moderate to severe.  7. The aortic valve is calcified. Aortic valve regurgitation is mild. Mild to moderate aortic valve sclerosis/calcification is present, without any evidence of aortic stenosis. FINDINGS  Left Ventricle: Left ventricular ejection fraction, by estimation, is 55 to 60%. The left ventricle has normal function. The left ventricle has no regional wall motion abnormalities. The left ventricular internal cavity size was normal in size. There is  no left ventricular hypertrophy. The interventricular septum is flattened in diastole ('D' shaped left ventricle), consistent with right ventricular volume overload. Left ventricular diastolic parameters are consistent with Grade I diastolic dysfunction  (impaired relaxation). Right Ventricle: The right  ventricular size is moderately enlarged. No increase in right ventricular wall thickness. Right ventricular systolic function is moderately reduced. There is moderately elevated pulmonary artery systolic pressure. Left Atrium: Left atrial size was mildly dilated. Right Atrium: Right atrial size was severely dilated. Pericardium: There is no evidence of pericardial effusion. Mitral Valve: The mitral valve is abnormal. There is mild thickening of the anterior and posterior mitral valve leaflet(s). There is moderate calcification of the anterior and posterior mitral valve leaflet(s). Mild mitral annular calcification. Mild mitral valve regurgitation. MV peak gradient, 7.6 mmHg. The mean mitral valve gradient is 3.0 mmHg. Tricuspid Valve: The tricuspid valve is normal in structure. Tricuspid valve regurgitation is moderate to severe. Aortic Valve: The aortic valve is calcified. Aortic valve regurgitation is mild. Aortic regurgitation PHT measures 540 msec. Mild to moderate aortic valve sclerosis/calcification is present, without any evidence of aortic stenosis. Aortic valve mean gradient measures 7.0 mmHg. Aortic valve peak gradient measures 15.5 mmHg. Aortic valve area, by VTI measures 1.44 cm. Pulmonic Valve: The pulmonic valve was normal in structure. Pulmonic valve regurgitation is not visualized. Aorta:  The aortic root was not well visualized. IAS/Shunts: No atrial level shunt detected by color flow Doppler.  LEFT VENTRICLE PLAX 2D LVIDd:         4.82 cm  Diastology LVIDs:         3.34 cm  LV e' medial:    6.53 cm/s LV PW:         1.13 cm  LV E/e' medial:  13.2 LV IVS:        1.15 cm  LV e' lateral:   11.10 cm/s LVOT diam:     1.90 cm  LV E/e' lateral: 7.8 LV SV:         54 LV SV Index:   30 LVOT Area:     2.84 cm  RIGHT VENTRICLE RV Basal diam:  3.81 cm RV S prime:     18.00 cm/s TAPSE (M-mode): 2.4 cm LEFT ATRIUM             Index       RIGHT ATRIUM           Index LA diam:        4.50 cm 2.51 cm/m  RA Area:      28.70 cm LA Vol (A2C):   99.2 ml 55.38 ml/m RA Volume:   110.00 ml 61.41 ml/m LA Vol (A4C):   59.5 ml 33.22 ml/m LA Biplane Vol: 81.0 ml 45.22 ml/m  AORTIC VALVE                    PULMONIC VALVE AV Area (Vmax):    1.26 cm     PV Vmax:       0.80 m/s AV Area (Vmean):   1.25 cm     PV Peak grad:  2.6 mmHg AV Area (VTI):     1.44 cm AV Vmax:           197.00 cm/s AV Vmean:          125.000 cm/s AV VTI:            0.379 m AV Peak Grad:      15.5 mmHg AV Mean Grad:      7.0 mmHg LVOT Vmax:         87.50 cm/s LVOT Vmean:        55.200 cm/s LVOT VTI:          0.192 m LVOT/AV VTI ratio: 0.51 AI PHT:            540 msec  AORTA Ao Root diam: 3.00 cm Ao Asc diam:  3.20 cm MITRAL VALVE               TRICUSPID VALVE MV Area (PHT): 4.68 cm    TV Peak grad:   46.6 mmHg MV Peak grad:  7.6 mmHg    TV Vmax:        3.42 m/s MV Mean grad:  3.0 mmHg MV Vmax:       1.38 m/s    SHUNTS MV Vmean:      73.1 cm/s   Systemic VTI:  0.19 m MV Decel Time: 162 msec    Systemic Diam: 1.90 cm MV E velocity: 86.50 cm/s Dwayne Prince Rome MD Electronically signed by Yolonda Kida MD Signature Date/Time: 10/10/2019/12:05:53 PM    Final     Microbiology: Recent Results (from the past 240 hour(s))  SARS Coronavirus 2 by RT PCR (hospital order, performed in [facility] hospital lab) Nasopharyngeal Nasopharyngeal Swab  Status: None   Collection Time: 10/08/19 11:50 AM   Specimen: Nasopharyngeal Swab  Result Value Ref Range Status   SARS Coronavirus 2 NEGATIVE NEGATIVE Final    Comment: (NOTE) SARS-CoV-2 target nucleic acids are NOT DETECTED.  The SARS-CoV-2 RNA is generally detectable in upper and lower respiratory specimens during the acute phase of infection. The lowest concentration of SARS-CoV-2 viral copies this assay can detect is 250 copies / mL. A negative result does not preclude SARS-CoV-2 infection and should not be used as the sole basis for treatment or other patient management decisions.  A negative result  may occur with improper specimen collection / handling, submission of specimen other than nasopharyngeal swab, presence of viral mutation(s) within the areas targeted by this assay, and inadequate number of viral copies (<250 copies / mL). A negative result must be combined with clinical observations, patient history, and epidemiological information.  Fact Sheet for Patients:   StrictlyIdeas.no  Fact Sheet for Healthcare Providers: BankingDealers.co.za  This test is not yet approved or  cleared by the [place] FDA and has been authorized for detection and/or diagnosis of SARS-CoV-2 by FDA under an Emergency Use Authorization (EUA).  This EUA will remain in effect (meaning this test can be used) for the duration of the COVID-19 declaration under Section 564(b)(1) of the Act, 21 U.S.C. section 360bbb-3(b)(1), unless the authorization is terminated or revoked sooner.  Performed at [facility], [address] [place], [place] [address]   CULTURE, BLOOD (ROUTINE X 2) w Reflex to ID Panel     Status: Abnormal   Collection Time: 10/08/19  3:45 PM   Specimen: BLOOD  Result Value Ref Range Status   Specimen Description   Final    BLOOD RIGHT ANTECUBITAL Performed at [facility], [address] [place], [place] [address]    Special Requests   Final    BOTTLES DRAWN AEROBIC AND ANAEROBIC Blood Culture adequate volume Performed at [facility], [address] [place], [place] [address]    Culture  Setup Time   Final    GRAM POSITIVE COCCI IN BOTH AEROBIC AND ANAEROBIC BOTTLES CRITICAL RESULT CALLED TO, READ BACK BY AND VERIFIED WITH: Dorena Bodo AT 0347 10/09/19 SDR Performed at [facility], [place] [address], [place], [place] [address]    Culture STAPHYLOCOCCUS CAPITIS (A)  Final   Report Status 10/12/2019 FINAL  Final   Organism ID, Bacteria STAPHYLOCOCCUS CAPITIS  Final      Susceptibility    Staphylococcus capitis - MIC*    CIPROFLOXACIN 4 RESISTANT Resistant     ERYTHROMYCIN <=0.25 SENSITIVE Sensitive     GENTAMICIN <=0.5 SENSITIVE Sensitive     OXACILLIN <=0.25 SENSITIVE Sensitive     TETRACYCLINE <=1 SENSITIVE Sensitive     VANCOMYCIN 1 SENSITIVE Sensitive     TRIMETH/SULFA <=10 SENSITIVE Sensitive     CLINDAMYCIN <=0.25 SENSITIVE Sensitive     RIFAMPIN <=0.5 SENSITIVE Sensitive     Inducible Clindamycin NEGATIVE Sensitive     * STAPHYLOCOCCUS CAPITIS  CULTURE, BLOOD (ROUTINE X 2) w Reflex to ID Panel     Status: Abnormal   Collection Time: 10/08/19  3:45 PM   Specimen: BLOOD  Result Value Ref Range Status   Specimen Description   Final    BLOOD LEFT ANTECUBITAL Performed at [facility], [address] [place],  [address]    Special Requests   Final    BOTTLES DRAWN AEROBIC AND ANAEROBIC Blood Culture  adequate volume Performed at [facility], [address] [place], [place] [address]    Culture  Setup Time   Final    GRAM POSITIVE COCCI IN BOTH AEROBIC AND ANAEROBIC BOTTLES CRITICAL VALUE NOTED.  VALUE IS CONSISTENT WITH PREVIOUSLY REPORTED AND CALLED VALUE. Performed at [facility], [address] [place], [place] [address]    Culture (A)  Final    STAPHYLOCOCCUS CAPITIS SUSCEPTIBILITIES PERFORMED ON PREVIOUS CULTURE WITHIN THE LAST 5 DAYS. Performed at [facility], [place] [address], [place], [place] [address]    Report Status 10/12/2019 FINAL  Final  Blood Culture ID Panel (Reflexed)     Status: Abnormal   Collection Time: 10/08/19  3:45 PM  Result Value Ref Range Status   Enterococcus faecalis NOT DETECTED NOT DETECTED Final   Enterococcus Faecium NOT DETECTED NOT DETECTED Final   Listeria monocytogenes NOT DETECTED NOT DETECTED Final   Staphylococcus species DETECTED (A) NOT DETECTED Final    Comment: CRITICAL RESULT CALLED TO, READ BACK BY AND VERIFIED WITH:  ABBY ELLINGTON AT 0907 10/09/19 SDR     Staphylococcus aureus (BCID) NOT DETECTED NOT DETECTED Final   Staphylococcus epidermidis NOT DETECTED NOT DETECTED Final   Staphylococcus lugdunensis NOT DETECTED NOT DETECTED Final   Streptococcus species NOT DETECTED NOT DETECTED Final   Streptococcus agalactiae NOT DETECTED NOT DETECTED Final   Streptococcus pneumoniae NOT DETECTED NOT DETECTED Final   Streptococcus pyogenes NOT DETECTED NOT DETECTED Final   A.calcoaceticus-baumannii NOT DETECTED NOT DETECTED Final   Bacteroides fragilis NOT DETECTED NOT DETECTED Final   Enterobacterales NOT DETECTED NOT DETECTED Final   Enterobacter cloacae complex NOT DETECTED NOT DETECTED Final   Escherichia coli NOT DETECTED NOT DETECTED Final   Klebsiella aerogenes NOT DETECTED NOT DETECTED Final   Klebsiella oxytoca NOT DETECTED NOT DETECTED Final   Klebsiella pneumoniae NOT DETECTED NOT DETECTED Final   Proteus species NOT DETECTED NOT DETECTED Final   Salmonella species NOT DETECTED NOT DETECTED Final   Serratia marcescens NOT DETECTED NOT DETECTED Final   Haemophilus influenzae NOT DETECTED NOT DETECTED Final   Neisseria meningitidis NOT DETECTED NOT DETECTED Final   Pseudomonas aeruginosa NOT DETECTED NOT DETECTED Final   Stenotrophomonas maltophilia NOT DETECTED NOT DETECTED Final   Candida albicans NOT DETECTED NOT DETECTED Final   Candida auris NOT DETECTED NOT DETECTED Final   Candida glabrata NOT DETECTED NOT DETECTED Final   Candida krusei NOT DETECTED NOT DETECTED Final   Candida parapsilosis NOT DETECTED NOT DETECTED Final   Candida tropicalis NOT DETECTED NOT DETECTED Final   Cryptococcus neoformans/gattii NOT DETECTED NOT DETECTED Final    Comment: Performed at [facility], [address] [place], [place] [address]  CULTURE, BLOOD (ROUTINE X 2) w Reflex to ID Panel     Status: None (Preliminary result)   Collection Time: 10/11/19  2:23 PM   Specimen: BLOOD  Result Value Ref Range Status   Specimen Description  BLOOD BLOOD RIGHT FOREARM  Final   Special Requests   Final    BOTTLES DRAWN AEROBIC AND ANAEROBIC Blood Culture adequate volume   Culture   Final    NO GROWTH 4 DAYS Performed at [facility], [address] [place], [place] [address]    Report Status PENDING  Incomplete  CULTURE, BLOOD (ROUTINE X 2) w Reflex to ID Panel     Status: None (Preliminary result)   Collection Time: 10/11/19  2:29 PM   Specimen: BLOOD  Result  Value Ref Range Status   Specimen Description BLOOD BLOOD RIGHT HAND  Final   Special Requests   Final    BOTTLES DRAWN AEROBIC AND ANAEROBIC Blood Culture adequate volume   Culture   Final    NO GROWTH 4 DAYS Performed at [facility], [address] [place], [place] [address]    Report Status PENDING  Incomplete     Labs: Basic Metabolic Panel: Recent Labs  Lab 10/09/19 0358 10/10/19 0305 10/11/19 0411 10/12/19 0533 10/13/19 0526 10/14/19 0529 10/15/19 0634  NA 134*   < > 138 141 140 140 142  K 4.6   < > 3.8 3.8 3.9 4.0 3.8  CL 98   < > 100 102 99 99 101  CO2 26   < > 30 30 32 32 32  GLUCOSE 152*   < > 156* 148* 98 98 99  BUN 33*   < > 55* 54* 46* 44* 39*  CREATININE 1.37*   < > 1.12 1.16 0.98 0.91 1.05  CALCIUM 8.8*   < > 8.5* 8.9 9.2 8.9 9.2  MG 2.2  --   --   --  2.3  --   --    < > = values in this interval not displayed.   Liver Function Tests: Recent Labs  Lab 10/08/19 1150  AST 19  ALT 14  ALKPHOS 66  BILITOT 1.1  PROT 7.6  ALBUMIN 4.0   No results for input(s): LIPASE, AMYLASE in the last 168 hours. No results for input(s): AMMONIA in the last 168 hours. CBC: Recent Labs  Lab 10/08/19 1150 10/09/19 0358 10/12/19 0533 10/13/19 0526 10/14/19 0529 10/14/19 1204 10/15/19 0634  WBC 12.8*   < > 11.2* 12.5* 12.9* 14.5* 11.7*  NEUTROABS 8.6*  --   --   --   --   --   --   HGB 12.0*   < > 11.5* 11.7* 9.9* 11.2* 10.5*  HCT 35.0*   < > 34.7* 35.8* 31.2* 34.1* 32.6*  MCV 86.0   < > 87.8 89.9 91.5 89.5 91.3   PLT 209   < > 192 208 194 215 216   < > = values in this interval not displayed.   Cardiac Enzymes: No results for input(s): CKTOTAL, CKMB, CKMBINDEX, TROPONINI in the last 168 hours. BNP: BNP (last 3 results) Recent Labs    01/04/19 2023 01/25/19 0326 10/08/19 1150  BNP 1,871.0* 1,362.0* 1,004.8*    ProBNP (last 3 results) No results for input(s): PROBNP in the last 8760 hours.  CBG: No results for input(s): GLUCAP in the last 168 hours.     Signed:  Desma Maxim MD.  [facility] 10/15/2019, 7:37 AM

## 2019-10-15 NOTE — TOC Transition Note (Signed)
Transition of Care [facility]) - CM/SW Discharge Note   Patient Details  Name: RAYMOUND KATICH MRN: [id number] Date of Birth: [birth year]  Transition [facility] CM/SW Contact:  Victorino Dike, RN Phone Number: 10/15/2019, 8:56 AM       Final next level of care: [place] Barriers to Discharge: Continued Medical Work up   Patient Goals and CMS Choice     Choice offered to / list presented to : Patient, Sibling  Discharge Placement                       Discharge Plan and Services     Post Acute Care Choice: Home Health                    HH Arranged: RN, PT, OT [facility] Agency: [facility] Date [place]: 10/09/19 Time [place]: [place]    Social Determinants of Health (SDOH) Interventions     Readmission Risk Interventions Readmission Risk Prevention Plan 10/10/2019 01/31/2019  Transportation Screening Complete Complete  PCP or Specialist Appt within 3-5 Days Complete -  HRI or Home Care Consult Complete -  Social Work Consult for [place] Planning/Counseling Complete -  Palliative Care Screening Complete -  Medication Review [employer]) Complete -  Some recent data might be hidden

## 2019-10-15 NOTE — Telephone Encounter (Signed)
Patient identified as a candidate to receive 7 heart healthy meals per week for 4 weeks through [facility].  Completed referral sent in for review.  Anticipate patient will receive first shipment of food in 1-3 business days.  Christopher Diven H. Zebediah Beezley, LCSW Clinical Social Worker [facility] Desk#: [phone] Cell#: [phone]  

## 2019-10-16 LAB — CULTURE, BLOOD (ROUTINE X 2)
Culture: NO GROWTH
Culture: NO GROWTH
Special Requests: ADEQUATE
Special Requests: ADEQUATE

## 2019-10-18 ENCOUNTER — Other Ambulatory Visit (HOSPITAL_COMMUNITY): Payer: Self-pay

## 2019-10-18 ENCOUNTER — Encounter (HOSPITAL_COMMUNITY): Payer: Self-pay

## 2019-10-18 NOTE — Progress Notes (Signed)
Today had a home visit with [name] and his daughter.  He appears to be doing ok.  He states the pain is still there but a little better than last week.  His appetite is good, watches high sodium foods.  Moms meals will be delivered this week for trial of 4 weeks.  He is not weighing but will try tomorrow.  His daughter has scheduled his follow up appts.  He has all his medications and his daughter helps him with those.  Verified them.  He is able to ambulate with walker.  He sits with feet propped up higher than his heart to keep swelling down.  He has no edema in lower extremities.  Lungs has some crackles in lower lobes r and left, upper is clear.  He is doing his spirometer and flutter often.  He states can cough up some stuff now but hurts, he takes a pillow and hold over his chest to help with the pain. He has slept a couple of nights and some nights has not slept.  He is urinating normally he states.  He is on 4 lpm oxygen by nasal.  He has had 1 visit from nurse with [facility] and PT is suppose to start this week.  He is alert and comprehend what happened.  Will continue to visit for heart failure.   [place] [phone]

## 2019-10-26 ENCOUNTER — Telehealth: Payer: Self-pay | Admitting: Nurse Practitioner

## 2019-10-26 ENCOUNTER — Other Ambulatory Visit: Payer: Medicare Other | Admitting: Nurse Practitioner

## 2019-10-26 ENCOUNTER — Other Ambulatory Visit: Payer: Self-pay

## 2019-10-26 NOTE — Telephone Encounter (Signed)
[name], Mr. Werts daughter called to reschedule PC f/u visit as Mr. Buchler just returned home from hospital, has home health coming. [name] endorses a lot of people and wishes to wait for PC. Appointment was reschedule for 2 weeks to see how he will do with therapy. Contact information

## 2019-10-27 ENCOUNTER — Ambulatory Visit: Payer: Medicare Other | Admitting: Family

## 2019-11-09 ENCOUNTER — Other Ambulatory Visit: Payer: Medicare Other | Admitting: Nurse Practitioner

## 2019-11-09 ENCOUNTER — Other Ambulatory Visit: Payer: Self-pay

## 2019-11-09 ENCOUNTER — Encounter: Payer: Self-pay | Admitting: Nurse Practitioner

## 2019-11-09 DIAGNOSIS — Z515 Encounter for palliative care: Secondary | ICD-10-CM

## 2019-11-09 DIAGNOSIS — I509 Heart failure, unspecified: Secondary | ICD-10-CM

## 2019-11-09 NOTE — Progress Notes (Signed)
[facility] Consult Note Telephone: [phone]  Fax: [phone]  PATIENT NAME: [name] DOB: [birth year] MRN: [id number]  PRIMARY CARE PROVIDER:   Baxter Hire, MD  REFERRING PROVIDER:  Baxter Hire, MD [place],  [place] [address]  RESPONSIBLE PARTY:Self; [place], [place] [phone] or [phone]  RECOMMENDATIONS and PLAN: 1.ACP: wishes for DNR;in vynca;   2. Dyspnea, continue to monitor respiratory status; O2, nebulizers, pulmonary toileting  3.Palliative care encounter Palliative medicine team will continue to support patient, patient's family, and medical team. Visit consisted of counseling and education dealing with the complex and emotionally intense issues of symptom management and palliative care in the setting of serious and potentially life-threatening illness  4. F/u 4 weeks due to slow decline, chronically ill, elderly, symptomatic fatigue with intermit dyspnea, ongoing goc I spent 60 minutes providing this consultation,  from 11:30am to 12:30pm. More than 50% of the time in this consultation was spent coordinating communication.   HISTORY OF PRESENT ILLNESS:  [name] is a 86 y.o. 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 Hospitalization 10/08/2019 to 10/15/2019 for acute on chronic respiratory failure with hypoxia, progressive shortness of breath, multifactorial likely aspiration pneumonia, pneumonitis with CHF (edema, positive JVD, BNP 1004), COPD exacerbation with wheezing. Severe sepsis secondary to aspiration pna requiring unasyn then changed to augmentin. Acute on chronic diastolic CHF with valvular heart disease worsening hypoxia requiring diuresis with AKI which  resolved. Severe dilated RA, moderate- to severe TR, mild MR. Sternal fracture upon CT chest, several compression fractures thoracic and lumbar. Involving T5, T7 (high grade), L2 (high grade). Patient denies back pain and has no palpable midline tenderness along the entirety of his back.  MRI of T and L spine - T5 and T7 are acute to subacute, T3 and T4 subacute to chronic and more mild, L1 through L5 are chronic. Osteoporosis started fosamax, calcium, vitamin D. Discharge home with home health, PT/OT, continuous O2.  In-person follow-up palliative care visit with [name] and his daughter [name]. How did that purpose of palliative visit. [name] in agreement. We talked about how he has been feeling. We talked about recent hospitalization for fractured sternum. We talked about symptoms of pain which is improved and he is now taking Tylenol. [name] expressed he have difficulty with constipation with the pain pills but he no longer requires them. [name] endorses his bowels have straightened back out. We talked about his appetite which is good. [name] I just about any knowledge of services for catering for in-home meals. We talked about a couple resources that appeared and will check with volunteer services to see if any suggestions. We talked about [facility] for resources. We talked about symptoms of shortness of breath which he does exhibit with exertion. [name] remains on 2 liters of oxygen continuous. [name] is hoping that eventually he will be able to get back to where he was having to wear his oxygen when ambulating but he can take it off when resting. [name] endorses a new tank was delivered. We talked about physical therapy and occupational therapy who continue to come out and work with [name]. [name] endorses she asked that the plan of care be changed where he would receive physical therapy and occupational therapy for a longer amount of weeks rather  than multiple  visits in one week. [name] endorses [name] does get very tired with therapy and all the tasks that he has to do throughout the day such as nebulizers, his exercises. [name]'s expressed her thought process was that less amount of work each week but to extend it longer so it creates more of an endurance and gives him time to rest and heal. We talked about importance of ongoing communication with therapists. We talked about advocating for [name]. Praised Engineer, maintenance (IT) for doing that effectively. We talked about blood pressure. We talked about medical goals of care. We talked about realistic expectations making sure we are asking [name] to do what he is body is capable of doing. [name] endorses [name] has been verbal about when things are too much and wears him out. Medical goals reviewed.  [name] endorses he is sleeping at night well. We talked about role of palliative care and plan of care. It discussed will follow up in two months if needed or sooner if declined. [name] in agreement, appointments scheduled. Questions answered to satisfaction. Therapeutic listening and emotional support provided.  Palliative Care was asked to help to continue to address goals of care.   CODE STATUS: DNR  PPS: 50% HOSPICE ELIGIBILITY/DIAGNOSIS: TBD  PAST MEDICAL HISTORY:  Past Medical History:  Diagnosis Date  . CHF (congestive heart failure) ([place])   . COPD (chronic obstructive pulmonary disease) ([place])   . Coronary artery disease   . Hypertension     SOCIAL HX:  Social History   Tobacco Use  . Smoking status: Former Research scientist (life sciences)  . Smokeless tobacco: Never Used  Substance Use Topics  . Alcohol use: Never    ALLERGIES: No Known Allergies   PERTINENT MEDICATIONS:  Outpatient Encounter Medications as of 11/09/2019  Medication Sig  . albuterol (PROVENTIL) (2.5 MG/3ML) 0.083% nebulizer solution Take 3 mLs (2.5 mg total) by nebulization every 6 (six) hours as needed for wheezing or  shortness of breath.  Marland Kitchen albuterol (VENTOLIN HFA) 108 (90 Base) MCG/ACT inhaler Inhale into the lungs every 6 (six) hours as needed for wheezing or shortness of breath.  Marland Kitchen atorvastatin (LIPITOR) 20 MG tablet Take 20 mg by mouth daily.  Marland Kitchen CARTIA XT 300 MG 24 hr capsule Take 300 mg by mouth daily.  . fexofenadine (ALLEGRA) 180 MG tablet Take 180 mg by mouth daily.  . Fluticasone-Umeclidin-Vilant (TRELEGY ELLIPTA) 100-62.5-25 MCG/INH AEPB Inhale 1 puff into the lungs daily.   . furosemide (LASIX) 20 MG tablet TAKE 1 TABLET BY MOUTH DAILY  . lidocaine (LIDODERM) 5 % Place 1 patch onto the skin daily. Remove & Discard patch within 12 hours or as directed by MD  . metoprolol tartrate (LOPRESSOR) 50 MG tablet Take 50 mg by mouth 2 (two) times daily.  . mometasone (NASONEX) 50 MCG/ACT nasal spray Place 1 spray into the nose daily. Each nostril (Patient not taking: Reported on 10/18/2019)  . potassium chloride (KLOR-CON) 10 MEQ tablet Take 10 mEq by mouth daily.  . predniSONE (DELTASONE) 10 MG tablet Take 5 mg by mouth daily with breakfast.  . traZODone (DESYREL) 50 MG tablet Take 50 mg by mouth at bedtime as needed for sleep.  (Patient not taking: Reported on 10/18/2019)  . Vitamin D, Ergocalciferol, (DRISDOL) 1.25 MG (50000 UNIT) CAPS capsule Take 1 capsule (50,000 Units total) by mouth every 7 (seven) days.  Marland Kitchen warfarin (COUMADIN) 2 MG tablet Take 2 mg by mouth daily.    No facility-administered encounter medications  on file as of 11/09/2019.    PHYSICAL EXAM:   General: NAD, frail appearing, thin, pale pleasant male Cardiovascular: regular rate and rhythm Pulmonary: clear ant fields; decrease bases Neurological: generalized weakness  Jasmynn Pfalzgraf Ihor Gully, NP

## 2019-11-23 ENCOUNTER — Telehealth (HOSPITAL_COMMUNITY): Payer: Self-pay

## 2019-11-23 NOTE — Telephone Encounter (Signed)
Spoke with [name] today, she states her Dad is doing good.  Not as good as before the hospital stay but is improving.  He still wearing his oxygen, able to get around on his own.  He has all his medications and aware of his up coming appts.  They try to keep to their self to stay healthy, neither has had covid vaccine.  He mostly stays home and stays active in the home.  Sleeping well at night.  He is aware of when to take his medications and how.  [name] places it out for him.  He is able to cook simple foods for his self.  Appetite is good.  Moms meals were not a big hit for him, he ate some of them but did not like some.  They are deciding if want to continue them.  His weight has stayed the same and weighing daily now.  He has physical therapy and nursing still coming out.  They want to limit people coming out to limit germs and exertion on him.  She has no concerns today.  Will continue to visit for heart failure.  They are both aware to call if any problems.   [place] [phone]

## 2019-12-27 ENCOUNTER — Telehealth (HOSPITAL_COMMUNITY): Payer: Self-pay

## 2019-12-27 NOTE — Telephone Encounter (Signed)
Had a telephone visit with [name] and [name] his daughter.  He has finished PT at home.  He is aware of his up coming appts.  He is still on his oxygen.  He is able to get around at home and do some simple things.  [name] puts out his medications for him and he takes with food.  He is drinking water and watching how much.  He watches high sodium foods.  He has all his medications.  [name] states weight is staying same.  He is ambulating around the house daily for exercise, too cold outside.  He is doing his breathing treatments.  They are very cautious about the public with COVID because both has not had the vaccine.  He has not had any falls recently, pain is much better from his last fall.  Will continue to visit for heart failure.   [place] [phone]

## 2020-01-05 ENCOUNTER — Other Ambulatory Visit: Payer: Self-pay

## 2020-01-05 ENCOUNTER — Ambulatory Visit: Payer: Medicare Other | Attending: Family | Admitting: Family

## 2020-01-05 ENCOUNTER — Encounter: Payer: Self-pay | Admitting: Family

## 2020-01-05 VITALS — BP 128/70 | HR 77 | Resp 18 | Ht 70.0 in | Wt 148.2 lb

## 2020-01-05 DIAGNOSIS — I509 Heart failure, unspecified: Secondary | ICD-10-CM | POA: Diagnosis not present

## 2020-01-05 DIAGNOSIS — Z79899 Other long term (current) drug therapy: Secondary | ICD-10-CM | POA: Diagnosis not present

## 2020-01-05 DIAGNOSIS — J449 Chronic obstructive pulmonary disease, unspecified: Secondary | ICD-10-CM | POA: Insufficient documentation

## 2020-01-05 DIAGNOSIS — Z7901 Long term (current) use of anticoagulants: Secondary | ICD-10-CM | POA: Insufficient documentation

## 2020-01-05 DIAGNOSIS — I5032 Chronic diastolic (congestive) heart failure: Secondary | ICD-10-CM

## 2020-01-05 DIAGNOSIS — I110 Hypertensive heart disease with heart failure: Secondary | ICD-10-CM | POA: Insufficient documentation

## 2020-01-05 DIAGNOSIS — I2721 Secondary pulmonary arterial hypertension: Secondary | ICD-10-CM | POA: Insufficient documentation

## 2020-01-05 DIAGNOSIS — Z7952 Long term (current) use of systemic steroids: Secondary | ICD-10-CM | POA: Diagnosis not present

## 2020-01-05 DIAGNOSIS — Z87891 Personal history of nicotine dependence: Secondary | ICD-10-CM | POA: Diagnosis not present

## 2020-01-05 DIAGNOSIS — J42 Unspecified chronic bronchitis: Secondary | ICD-10-CM

## 2020-01-05 DIAGNOSIS — I2510 Atherosclerotic heart disease of native coronary artery without angina pectoris: Secondary | ICD-10-CM | POA: Diagnosis not present

## 2020-01-05 DIAGNOSIS — I10 Essential (primary) hypertension: Secondary | ICD-10-CM

## 2020-01-05 NOTE — Patient Instructions (Signed)
Continue weighing daily and call for an overnight weight gain of > 2 pounds or a weekly weight gain of >5 pounds. 

## 2020-01-05 NOTE — Progress Notes (Signed)
Patient ID: [name], male    DOB: 05-19-1928, 86 y.o.   MRN: [id number]  HPI  [name] is a 86 y/o male with a history of CAD, HTN, previous tobacco use and chronic heart failure.   Echo report from 10/08/19 reviewed and showed an EF of 55-60% along with moderately elevated PA pressure, severe LAE/ RAE and mild Christopher. Echo report from 01/06/2019 reviewed and showed an EF of 55-60% along with mild/ moderate Christopher and moderately elevated PA pressure.   Admitted 10/08/19 due to shortness of breath related to possible aspiration pneumonia/ pneumonitis. Needed increased oxygen requirements but then able to be weaned down to 3L. Antibiotics given for pneumonia. Initially given IV lasix and then developed AKI which then resolved. Sternal body fracture noted by chest CT. Discharged after 7 days.   He presents today for a follow-up visit with a chief complaint of moderate shortness of breath upon moderate exertion. He describes this as chronic in nature having been present for several years although he does feel like it has worsened over the last several days or so. He has associated fatigue, cough and fluctuating weight along with this. He denies any difficulty sleeping, abdominal distention, palpitations, pedal edema, chest pain, wheezing or dizziness.    Says that he feels like he's gained weight although doesn't think his scale is working properly and needs to get another one.         Past Medical History:  Diagnosis Date  . CHF (congestive heart failure) ([place])   . COPD (chronic obstructive pulmonary disease) ([place])   . Coronary artery disease   . Hypertension    Past Surgical History:  Procedure Laterality Date  . Renal Bypass Surgery     No family history on file. Social History   Tobacco Use  . Smoking status: Former Research scientist (life sciences)  . Smokeless tobacco: Never Used  Substance Use Topics  . Alcohol use: Never   No Known Allergies  Prior to Admission medications   Medication Sig Start  Date End Date Taking? Authorizing Provider  albuterol (PROVENTIL) (2.5 MG/3ML) 0.083% nebulizer solution Take 3 mLs (2.5 mg total) by nebulization every 6 (six) hours as needed for wheezing or shortness of breath. 01/31/19  Yes Danford, Suann Larry, MD  albuterol (VENTOLIN HFA) 108 (90 Base) MCG/ACT inhaler Inhale into the lungs every 6 (six) hours as needed for wheezing or shortness of breath.   Yes [provider]  apixaban (ELIQUIS) 2.5 MG TABS tablet Take 2.5 mg by mouth 2 (two) times daily.   Yes [provider]  atorvastatin (LIPITOR) 20 MG tablet Take 20 mg by mouth daily.   Yes [provider]  CARTIA XT 300 MG 24 hr capsule Take 300 mg by mouth daily. 09/16/19  Yes [provider]  fexofenadine (ALLEGRA) 180 MG tablet Take 180 mg by mouth daily.   Yes [provider]  Fluticasone-Umeclidin-Vilant (TRELEGY ELLIPTA) 100-62.5-25 MCG/INH AEPB Inhale 1 puff into the lungs daily.    Yes [provider]  furosemide (LASIX) 20 MG tablet TAKE 1 TABLET BY MOUTH DAILY 09/16/19  Yes [name] A, FNP  metoprolol tartrate (LOPRESSOR) 50 MG tablet Take 50 mg by mouth 2 (two) times daily.   Yes [provider]  potassium chloride (KLOR-CON) 10 MEQ tablet Take 10 mEq by mouth daily.   Yes [provider]  predniSONE (DELTASONE) 10 MG tablet Take 5 mg by mouth daily with breakfast.   Yes [provider]  Vitamin D, Ergocalciferol, (DRISDOL) 1.25 MG (50000 UNIT) CAPS capsule Take 1 capsule (50,000 Units total) by mouth every 7 (seven) days. 10/15/19  Yes Wouk, Ailene Rud, MD  traZODone (DESYREL) 50 MG tablet Take 50 mg by mouth at bedtime as needed for sleep.  Patient not taking: Reported on 10/18/2019 11/18/18 11/18/19  [provider]     Review of Systems  Constitutional: Positive for fatigue. Negative for appetite change.  HENT: Negative for congestion, postnasal drip and sore throat.   Eyes: Negative.    Respiratory: Positive for cough (after using nebulizer) and shortness of breath (with moderate exertion). Negative for wheezing.   Cardiovascular: Negative for chest pain, palpitations and leg swelling.  Gastrointestinal: Negative for abdominal distention and abdominal pain.  Endocrine: Negative.   Genitourinary: Negative.   Musculoskeletal: Negative for back pain and neck pain.  Skin: Negative.   Allergic/Immunologic: Negative.   Neurological: Negative for dizziness and light-headedness.  Hematological: Negative for adenopathy. Does not bruise/bleed easily.  Psychiatric/Behavioral: Negative for dysphoric mood and sleep disturbance (sleeping on 2 pillows). The patient is not nervous/anxious.    Vitals:   01/05/20 1220  BP: 128/70  Pulse: 77  Resp: 18  SpO2: 92%  Weight: 148 lb 4 oz (67.2 kg)  Height: 5\' 10"  (1.778 m)   Wt Readings from Last 3 Encounters:  01/05/20 148 lb 4 oz (67.2 kg)  10/13/19 147 lb 12.8 oz (67 kg)  10/05/19 146 lb 4 oz (66.3 kg)   Lab Results  Component Value Date   CREATININE 1.05 10/15/2019   CREATININE 0.91 10/14/2019   CREATININE 0.98 10/13/2019    Physical Exam Vitals and nursing note reviewed.  Constitutional:      Appearance: He is well-developed.  HENT:     Head: Normocephalic and atraumatic.  Neck:     Vascular: No JVD.  Cardiovascular:     Rate and Rhythm: Normal rate and regular rhythm.  Pulmonary:     Effort: Pulmonary effort is normal. No respiratory distress.     Breath sounds: Wheezing (bilateral lower lobes) present. No rales.  Abdominal:     Palpations: Abdomen is soft.     Tenderness: There is no abdominal tenderness.  Musculoskeletal:     Cervical back: Normal range of motion and neck supple.     Right lower leg: No tenderness. No edema.     Left lower leg: No tenderness. No edema.  Skin:    General: Skin is warm and dry.  Neurological:     General: No focal deficit present.     Mental Status: He is alert and oriented  to person, place, and time.  Psychiatric:        Mood and Affect: Mood normal.        Behavior: Behavior normal.    Assessment & Plan:  1: Chronic heart failure with preserved ejection fraction- - NYHA class II - euvolemic today - weighing daily although he questions accuracy and needs to get a new set; set of scales given to him today; reminded to call for an overnight weight gain of >2 pounds or a weekly weight gain of >5 pounds - weight up 2 pounds from last visit here 3 months ago - not adding salt but does like Nature conservation officer from [employer] or curly fries from [employer]; reviewed the importance of looking at food labels so that he can keep daily sodium intake to 2000mg  / day - due to wheezing and increased SOB, advised patient to double  his furosemide/ potassium for 2 days; if symptoms do not improve, he needs to f/u with pulmonology - saw cardiology Minette Brine) 11/16/19 - saw palliative care 11/09/19 - participating in paramedicine program - BNP 10/08/19 was 1004.8  2: HTN- - BP looks good today - saw PCP Edwina Barth) 12/21/19 - BMP 10/15/19 reviewed and showed sodium 142, potassium 3.8, creatinine 1.05 and GFR >60  3: COPD/ PAH- - wearing oxygen at 2.5L around the clock - saw pulmonology Raul Del) 12/31/19 - using nebulizer 4 times/ day   Medication list was reviewed with patient and daughter.   Return in 6 months or sooner for any questions/problems before then.

## 2020-01-07 ENCOUNTER — Telehealth: Payer: Self-pay | Admitting: Family

## 2020-01-07 NOTE — Telephone Encounter (Signed)
Daughter called back stating that increasing his diuretic/ potassium didn't help with his symptoms much at all. Continues to have quite a bit of shortness of breath and wears his oxygen around the clock. Using his nebulizer 4 times/ day.   Explained to daughter that since his symptoms didn't improve any with increase in diuretic, most likely his shortness of breath is coming from his COPD/ pulmonary HTN and that she could call his pulmonologist Raul Del).   Daughter verbalized understanding and says that she will call Dr Raul Del.

## 2020-01-10 ENCOUNTER — Other Ambulatory Visit (HOSPITAL_COMMUNITY): Payer: Self-pay

## 2020-01-10 ENCOUNTER — Encounter (HOSPITAL_COMMUNITY): Payer: Self-pay

## 2020-01-10 NOTE — Progress Notes (Signed)
Today had a home visit with Lerone.  He met me at the door opening it.  He states doing pretty good today.  He is not short of breath after walking back down to sit.  He has his oxygen on.  Spent an hour at his home, he never experienced shortness of breath.  He community during the visit.  He appears to be in a good mood, he talked and told jokes.  He states getting up more this week and moving around.  He does his dishes in the evening.  His daughter has been staying with him at night.  Lungs has little wheezes lower, goods sounds in other fields.  Pulse ox 97% sitting.  No edema noted in extremities.  He states been doing his exercises again that PT had showed him in past couple of days.  He heats up his meals for lunch and supper or makes him a sandwich.  Daughter been cooking his breakfast.  She lays his meds out for him and makes sure he takes them.  She states he has been missing his evening meds, advised him to take them with his supper like he takes his morning meds with his breakfast.  Discussed eating, he states he is worried about gaining weight so he cut back on meals.  Advised he does not need to lose weight, just caution with high sodium foods.  He needs to eat plenty of food at meal time.  His daughter advised he has been cutting back last couple of days and she was concerned with it.  Discussed he needs the vitamins and protein in foods.  Discussed protein shakes with him and snacks.  Discussed he is not drinking but about 35 to 40 ounces of fluids a day, discussed to up it to 60 ounces, his kidneys needs the fluids to stay healthy.  He has all his medications and aware of what he takes.  Palliative Care has a visit with him tomorrow and mentioned to him about hospice.  He did not say much back, [name] his daughter and I have spoke also about it.  Christin with Palliative had asked me to mention it to them.  He has no complaints today, he states his chest is no longer hurting from his injury.  He has  some back pain in the bed laying down until he gets comfortable on his side.  He is sleeping at night.  He wakes up about once or twice a night to use bathroom.  He states appetite is good, he denies cough.  He states breathing is better than last week.  Will continue to visit for heart failure, diet and medication compliance.   [place] [phone]

## 2020-01-11 ENCOUNTER — Other Ambulatory Visit: Payer: Medicare Other | Admitting: Nurse Practitioner

## 2020-01-11 ENCOUNTER — Encounter: Payer: Self-pay | Admitting: Nurse Practitioner

## 2020-01-11 ENCOUNTER — Other Ambulatory Visit: Payer: Self-pay

## 2020-01-11 DIAGNOSIS — Z515 Encounter for palliative care: Secondary | ICD-10-CM

## 2020-01-11 DIAGNOSIS — I509 Heart failure, unspecified: Secondary | ICD-10-CM

## 2020-01-11 NOTE — Progress Notes (Signed)
[facility] [place] Consult Note Telephone: [phone]  Fax: [phone]  PATIENT NAME: [name] DOB: 1928/08/15 MRN: [id number]  PRIMARY CARE PROVIDER:   Baxter Hire, MD  REFERRING PROVIDER:  Baxter Hire, MD [place],  [place] [address]   RESPONSIBLE PARTY:Self; [place], [place] [phone] or [phone]  RECOMMENDATIONS and PLAN: 1.ACP: wishes for DNR;in vynca;   2. Dyspnea, continue to monitor respiratory status; O2, nebulizers, pulmonary toileting  3.Palliative care encounter Palliative medicine team will continue to support patient, patient's family, and medical team. Visit consisted of counseling and education dealing with the complex and emotionally intense issues of symptom management and palliative care in the setting of serious and potentially life-threatening illness  4. F/u 6 weeks due to slow decline, chronically ill, elderly, symptomatic fatigue with intermit dyspnea, ongoing goc  I spent 60 minutes providing this consultation,  from 11:15am to 12:15pm. More than 50% of the time in this consultation was spent coordinating communication.   HISTORY OF PRESENT ILLNESS:  [name] is a 86 y.o. 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 In-person Palliative care follow-up visit with [name] in his daughter [name]. We talked about purpose of Palliative care visit. We talked about how Mr. [name] been feeling. We talked about symptoms including pain and shortness of breath. [name] endorses that the shortness of breath has gotten a little bit better over the last few days. Prior to that he had an appointment with [name] and he was having more increase shortness of  breath. We talked about symptom management. We talked about his appetite which remains declined. Mr. Dietrick endorses that he is eating smaller meals. We talked about nutrition. We talked about constipation. Mr. Brumbach currently does have colace for which he has been taking. We talked about adding Miralax or Metamucil to help increase fiber. We talked about do kallax which he has which he can use if he is constipated. We talked about option of Senokot though Mr. Clemons has a bottle of ducolax in his home already. We talked about medical goals of care. We talked about an upcoming appointment with Dr Raul Del. [name] endorses since Mr. Sunday has been feeling better is it necessary to keep the appointment. We talked about how appointment with the importance, coming up a plan on how to manage the intermittent shortness of breath with [name] over the next several months. [name] talked about the concerned she has about bringing [name] out two appointments. We talked about last appointment with [name]. [name] and daughter says she has not noticed any changes with the increase in diuretic for two days. We talked about Hospice benefit under Medicare program. We talked about what will be provided. Mr. Rawles wife was under Hospice only for a very short. Of time. We talked about Hospice philosophy of improving quality of life, focus on Comfort Care. We talked about medications and reviewed. We talked about Hospice formulary and that some of the medications could possibly be changed to the hospice formulary if found eligible and wishes are for Hospice Services. We reviewed the list if [name] medications the other option is to get a three month supply in the home prior to Hospice starting. We talked about Trilogy, Eliquis, Lipitor, Fosamax, vitamin D. We  talked about specialty appointments. Encourage [name] and [name] to further discuss with Dr  Raul Del about possible option of Hospice Services. We talked about progression of COPD, O2 dependents. We talked about role of Palliative care in plan of care. We talked about follow up Palliative care visit in 6 weeks if needed or sooner should he declined. [name] and [name] in agreement. Appointment scheduled. Therapeutic listening and emotional support provided. Contact information. Questions answered to satisfaction.  Palliative Care was asked to help to continue address goals of care.   CODE STATUS: DNR  PPS: 40% HOSPICE ELIGIBILITY/DIAGNOSIS: TBD  PAST MEDICAL HISTORY:  Past Medical History:  Diagnosis Date  . CHF (congestive heart failure) ([place])   . COPD (chronic obstructive pulmonary disease) ([place])   . Coronary artery disease   . Hypertension     SOCIAL HX:  Social History   Tobacco Use  . Smoking status: Former Research scientist (life sciences)  . Smokeless tobacco: Never Used  Substance Use Topics  . Alcohol use: Never    ALLERGIES: No Known Allergies   PERTINENT MEDICATIONS:  Outpatient Encounter Medications as of 01/11/2020  Medication Sig  . albuterol (PROVENTIL) (2.5 MG/3ML) 0.083% nebulizer solution Take 3 mLs (2.5 mg total) by nebulization every 6 (six) hours as needed for wheezing or shortness of breath.  Marland Kitchen albuterol (VENTOLIN HFA) 108 (90 Base) MCG/ACT inhaler Inhale into the lungs every 6 (six) hours as needed for wheezing or shortness of breath.  Marland Kitchen apixaban (ELIQUIS) 2.5 MG TABS tablet Take 2.5 mg by mouth 2 (two) times daily.  Marland Kitchen atorvastatin (LIPITOR) 20 MG tablet Take 20 mg by mouth daily.  Marland Kitchen CARTIA XT 300 MG 24 hr capsule Take 300 mg by mouth daily.  . fexofenadine (ALLEGRA) 180 MG tablet Take 180 mg by mouth daily.  . Fluticasone-Umeclidin-Vilant (TRELEGY ELLIPTA) 100-62.5-25 MCG/INH AEPB Inhale 1 puff into the lungs daily.   . furosemide (LASIX) 20 MG tablet TAKE 1 TABLET BY MOUTH DAILY  . metoprolol tartrate (LOPRESSOR) 50 MG tablet Take 50 mg by mouth 2 (two) times daily.   . potassium chloride (KLOR-CON) 10 MEQ tablet Take 10 mEq by mouth daily.  . predniSONE (DELTASONE) 10 MG tablet Take 5 mg by mouth daily with breakfast.  . traZODone (DESYREL) 50 MG tablet Take 50 mg by mouth at bedtime as needed for sleep.  (Patient not taking: Reported on 10/18/2019)  . Vitamin D, Ergocalciferol, (DRISDOL) 1.25 MG (50000 UNIT) CAPS capsule Take 1 capsule (50,000 Units total) by mouth every 7 (seven) days.   No facility-administered encounter medications on file as of 01/11/2020.    PHYSICAL EXAM:   General: chronically ill, frail appearing, thin male Cardiovascular: regular rate and rhythm Pulmonary: decrease breath sounds, crackles left base Neurological: ambulatory  [place] Dani Ihor Gully, NP

## 2020-01-14 ENCOUNTER — Telehealth: Payer: Self-pay | Admitting: Family

## 2020-01-14 ENCOUNTER — Other Ambulatory Visit: Payer: Self-pay | Admitting: Family

## 2020-01-14 MED ORDER — POTASSIUM CHLORIDE CRYS ER 10 MEQ PO TBCR
20.0000 meq | EXTENDED_RELEASE_TABLET | Freq: Every day | ORAL | 5 refills | Status: DC
Start: 2020-01-14 — End: 2022-07-30

## 2020-01-14 NOTE — Telephone Encounter (Signed)
Spoke with patient's daughter, [name], who had a question about patient's potassium. Patient saw his pulmonologist Christopher Del) yesterday and his diuretic was increased to furosemide 20mg  4 days/ week and then 40mg  on M, W, F. There wasn't a discussion about whether to also increase his potassium supplement.   Most recent lab work on 12/21/19 shows potassium of 4.3. [name] to have patient double his potassium tablets on M, W, F when he doubles his furosemide. New RX sent to [facility].   [name] verbalized understanding of instructions.

## 2020-02-01 ENCOUNTER — Telehealth: Payer: Self-pay | Admitting: Family

## 2020-02-01 NOTE — Telephone Encounter (Signed)
Returned patient's daughter, Landry Dyke, message. [name] says that patient has continued to take furosemide 20mg  4 days/ week and 40mg  on M, W, F. She says that patient says that he doesn't always urinate as much as he feels like he should when he takes the double dose.   06-10-1990 also says that patient's weight has ranged from 142.4 to 144.0 over the last week or so. No change in his shortness of breath and she hasn't noticed much swelling.   Does have a BP cuff but hasn't started checking it. Advised her to check it a couple of times/ week and call if it's < 100/50 or if he's feeling dizzy even if his BP is higher than that.   08-27-1990 to have patient continue checking his weight and to let me know if he gains > 2 pounds overnight or > 5 pounds in one week or if his symptoms worsen. [name] verbalized understanding and was in agreement with this plan.

## 2020-02-22 ENCOUNTER — Telehealth: Payer: Self-pay | Admitting: Family

## 2020-02-22 ENCOUNTER — Telehealth (HOSPITAL_COMMUNITY): Payer: Self-pay

## 2020-02-22 NOTE — Telephone Encounter (Signed)
Returned daughter, Evette Georges, call regarding her dad. She says that in the morning, he's having a hard time coughing up all the mucous that he has. During the day, it's easier for him to cough it up. She says that he coughs so hard that it sounds like he's choking and then when he gets the mucous up, he's so short of breath that he has to sit down and rest for awhile. She was wondering if there's anything he can do to help the morning time cough?   He's currently using his nebulizer 4 times/ day. Advised her to have him use his albuterol inhaler first thing in the morning before he gets up out of bed. Also advised to get plain mucinex to help keep the mucous loose. Should these things not help, she is to call Dr. Gust Brooms office as he may need to be seen sooner than his next scheduled appointment.   She verbalized understanding.

## 2020-02-24 NOTE — Telephone Encounter (Signed)
[name] contacted me to ask if there is anything that her Dad could do to help him cough phlegm up in the morning.  She was waiting for call from [facility].  Suggested him using his inhaler and [name] suggested mucinex (plain).  She called back on 26th stating it helped, he sounded much better the next morning.  She states before he was choking on it and getting very short winded, she states now he was able to cough wasier and come up easier without the increased shortness of breath.  She states otherwise he has been doing well.  He has all his medications and appetite is good.  Will continue to visit for heart failure.   [place] [phone]

## 2020-02-28 ENCOUNTER — Encounter (HOSPITAL_COMMUNITY): Payer: Self-pay

## 2020-02-29 ENCOUNTER — Encounter (HOSPITAL_COMMUNITY): Payer: Self-pay

## 2020-02-29 ENCOUNTER — Other Ambulatory Visit (HOSPITAL_COMMUNITY): Payer: Self-pay

## 2020-02-29 NOTE — Progress Notes (Signed)
Had a home visit with [name].  He states doing pretty good today.  He is on 3 lpm oxygen at all times and knows to up it if short of breath.  He states still coughing up thick phlegm in the morning but easier to cough up.  He tried mucinex once, advised him to try it for a few days, one dose will not get rid of it.  He states he has blown out some thick mucous out of his nose also and eyes are runny.  He states has some flonaise but not used it.  I told him he could try it.  He said he uses it for allergies, advised him even though he stays in doors he still get allergies and temperature changes effects him.  Appetite is good and weight has been stable.  Weighs daily.  He states not been walking as much since PT stopped but he states he gets weak at his knees.  Advised him to do his sitting exercises then try little bit by little to walk more. He states he hs backed off from his exercises but he will start them back.  He watches high sodium foods.  He has all his medications and his daughter helps with them.  He has everything for daily living.  Lungs are clear, he denies any chest pain, increased shortness of breath today, headaches or dizziness.  Will continue to visit for heart failure.   [place] [phone]

## 2020-03-07 ENCOUNTER — Other Ambulatory Visit: Payer: Self-pay

## 2020-03-07 ENCOUNTER — Telehealth: Payer: Self-pay | Admitting: Nurse Practitioner

## 2020-03-07 ENCOUNTER — Other Ambulatory Visit: Payer: Medicare Other | Admitting: Nurse Practitioner

## 2020-03-07 NOTE — Telephone Encounter (Signed)
I called [name], Mr. Stay to confirm f/u [facility] appointment. [name] endorses Mr. Wachter is doing okay, requests to reschedule visit from today, rescheduled per [facility] request. Contact information provided.

## 2020-03-27 ENCOUNTER — Telehealth: Payer: Self-pay | Admitting: Nurse Practitioner

## 2020-03-27 NOTE — Telephone Encounter (Signed)
I received email from Nile Dear, Community paramedic concerning [name], Mr. Vandam daughter wishes to cancel [facility] f/u visit since Mr. Sites is doing well, wishing to put off to May, 2022. I called with message left for [name] that will cancel 03/31/3031 and add to reschedule list. Contact information provided

## 2020-03-28 ENCOUNTER — Other Ambulatory Visit (HOSPITAL_COMMUNITY): Payer: Self-pay

## 2020-03-28 ENCOUNTER — Encounter (HOSPITAL_COMMUNITY): Payer: Self-pay

## 2020-03-28 NOTE — Progress Notes (Signed)
Had a home visit with Jamarius today.  He states been feeling good.  Appetite is good.  Breathing has been improving, still on oxygen at 3 lpm.  He states does exercises as he can tolerate it.  He has all his medications and his daughter helps him to remember to take them.  He watches high sodium foods and how much liquids he is drinking.  Lungs sounds good, no wheezes or rhonchi/crackles.   He states using his neb treatment 3 times a day.  We discussed on a pretty day going off with his daughter for a joy ride, he said he would like that.  To get out and get some fresh air and see how everything is growing.  His neighbor came and we all told stories.  He likes to tell stories and laugh.  Mood is good today.  Weight is staying same.  He is aware of up coming appts.  Will continue to visit for heart failure.   [place] [phone]

## 2020-03-30 ENCOUNTER — Other Ambulatory Visit: Payer: Medicare Other | Admitting: Nurse Practitioner

## 2020-03-30 ENCOUNTER — Other Ambulatory Visit: Payer: Self-pay

## 2020-04-11 ENCOUNTER — Telehealth: Payer: Self-pay | Admitting: Nurse Practitioner

## 2020-04-11 NOTE — Telephone Encounter (Signed)
Spoke with patient's daughter, [name], and have scheduled an In-home Palliative f/u visit for 05/16/20.

## 2020-05-16 ENCOUNTER — Encounter: Payer: Self-pay | Admitting: Nurse Practitioner

## 2020-05-16 ENCOUNTER — Other Ambulatory Visit: Payer: Medicare Other | Admitting: Nurse Practitioner

## 2020-05-16 ENCOUNTER — Other Ambulatory Visit: Payer: Self-pay

## 2020-05-16 VITALS — HR 82 | Resp 18

## 2020-05-16 DIAGNOSIS — L03031 Cellulitis of right toe: Secondary | ICD-10-CM

## 2020-05-16 DIAGNOSIS — J449 Chronic obstructive pulmonary disease, unspecified: Secondary | ICD-10-CM

## 2020-05-16 DIAGNOSIS — K5900 Constipation, unspecified: Secondary | ICD-10-CM

## 2020-05-16 DIAGNOSIS — Z515 Encounter for palliative care: Secondary | ICD-10-CM

## 2020-05-16 DIAGNOSIS — I509 Heart failure, unspecified: Secondary | ICD-10-CM

## 2020-05-16 NOTE — Progress Notes (Signed)
[facility] Consult Note Telephone: [phone]  Fax: [phone]    Date of encounter: 05/16/20 PATIENT NAME: [name] [id number]   [id number] (home) [phone] (work) DOB: 1928/06/30 MRN: [id number] PRIMARY CARE PROVIDER:    Baxter Hire, MD,  [place] [address] [phone]  RESPONSIBLE PARTY:    Contact Information    Name Relation Home Work Mobile   [name] Daughter   [phone]     I met face to face with patient and family in home. Palliative Care was asked to follow this patient by consultation request of  Christopher Hire, MD to address advance care planning and complex medical decision making. This is a follow up visit up  [place] / RECOMMENDATIONS:   Advance Care Planning/Goals of Care: Goals include to maximize quality of life and symptom management. Our advance care planning conversation included a discussion about:     The value and importance of advance care planning   Experiences with loved ones who have been seriously ill or have died   Exploration of personal, cultural or spiritual beliefs that might influence medical decisions   Exploration of goals of care in the event of a sudden injury or illness   Identification and preparation of a healthcare agent   Review and updating or creation of an  advance directive document .  Decision not to resuscitate or to de-escalate disease focused treatments due to poor prognosis.  CODE STATUS: DNR  Symptom Management/Plan: 1.ACP: wishes for DNR;in vynca;   2. Dyspnea, continue to monitor respiratory status; O2, nebulizers, pulmonary toileting  3. Cellulitis to top of right great toe, discussed wound care, continue to use triple antibiotic ointment; will give keflex f/u 2 weeks or sooner if worsens; Monitor for fever  Rx: Keflex 551m po bid x 10 days; No FR;  #20  3. Constipation; discussed bowel regimen; increase daily oral fluids, discussed will send in senna s. Stop colace. DIscussed if senna s makes stools too loose then use senna s M/W/F; colace T/TH/Sa  Rx: Senna S take 1 tablet po daily; #30; 3RF  4. CHF stable, continue with community Good program, daily weights, monitoring edema  5. COPD stable; continue with O2, inhalation therapy, monitoring respiratory status, pulmonary toileting  Follow up Palliative Care Visit: Palliative care will continue to follow for complex medical decision making, advance care planning, and clarification of goals. Return 2 weeks or prn.  I spent 60 minutes providing this consultation. More than 50% of the time in this consultation was spent in counseling and care coordination.  PPS: 50%  HOSPICE ELIGIBILITY/DIAGNOSIS: TBD  Chief Complaint: Palliative care visit for complex medical decision making  HISTORY OF PRESENT ILLNESS:  Christopher SCHAIBLEis a 87y.o. 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  I called [name] Christopher. SSchurmandaughter to confirm Palliative care follow up this palliative care follow-up visit and covid screening which was negative. I visited [name] his home. Christopher. SSarverwas sitting in the chair with his feet up. [name] with him. We talked about Palliative care visit. We talked about how [name] been feeling. [name] he is feeling good today. We talked about his appointment with Christopher Christopher Delpulmonology and [name] his cardiologist. No new changes at those  appointments. We talked about no recent hospitalizations or falls. We talked about ambulating with his walker. We talked about continuing on oxygen. [name] endorses that sometimes he will take his oxygen off when he is sitting down for  a long period of time but he does check his oxygen level which is usually 99%. We talked about shortness of breath which he currently denys. We talked about chronic cough. [name] endorses that he continues to cough up stringy type sputum. This is unchanged over the last several months. [name] endorses she was told that. We talked about symptoms of pain. [name] endorses that he does have pain in the tip of his right great toe where he has a wound. Upon assessment wound stage one with mild tenderness and mild erythema. Discussed that will start keflex 500 mg bid x 10 days for cellulitis, no fever. We talked about symptoms to watch for. We talked about [name] continues to follow with a visit scheduled for next week In two weeks. We talked about appetite. [name] endorses he does not eat a lot and has not been losing weight. [name] endorses that he feels like he is eating enough. [name] endorses she believes he is on  on a diet. We talked about nutrition.  Reviewed weights which [name] has been stable within 144 to 145 pounds.We talked about bowel regiment as he has been experiencing some constipation. Christopher. Good does take colace everyday though he does have to strain with bowel movements. We talked about option of increasing water in addition to trying senna S. We talked about center plus having senna S with colace together. Discussed if bowels become too loose may try the senna s every other with the colace. We talked about medical goals of care period we talked about role of palliative care in plan of care. Praised [name] for doing well with his breathing, encourage [name] to rest, energy conservation while maintaining endurance. We talked about sleep, [name] endorses he is sleeping well. We talked about follow up visit scheduled for telemedicine in two weeks concerning to in constipation and then will schedule face to face after with that  visit. [name] and [name] both in agreement, appointments scheduled. Therapeutic listing and emotional support provided.    History obtained from review of EMR, discussion with primary team, and interview with family, facility staff/caregiver and/or Christopher. Bas[place].  I reviewed available labs, medications, imaging, studies and related documents from the EMR.  Records reviewed and summarized above.   ROS Full 14 system review of systems performed and negative with exception of: as per HPI.   Physical Exam: Constitutional: NAD General: frail appearing, thin pleasant male EYES:  lids intact ENMT: oral mucous membranes moist CV: S1S2, RRR, no LE edema Pulmonary: LCTA, no increased work of breathing, no cough, room air Abdomen: normo-active BS + 4 quadrants, soft and non tender,  GU: deferred YFV:CBSWH all extremities, ambulatory Skin: warm and dry, no rashes or wounds on visible skin Neuro:  mild generalized weakness,  no cognitive impairment Psych: non-anxious affect, A and O x 3  Questions and concerns were addressed. The patient/family was encouraged to call with questions and/or concerns. Provided general support and encouragement, no other unmet needs identified  Thank you for the opportunity to participate in the care of Christopher. Kosman.  The palliative care team will continue to follow. Please call our office at [phone] if we can be of additional assistance.  This chart was dictated using voice recognition software. Despite best efforts to proofread, errors can occur which can change the documentation meaning.  Nycole Kawahara Z Gilberte Gorley, NP , MSN, ACHPN  COVID-19 PATIENT SCREENING TOOL Asked and negative response unless otherwise noted:   Have you had symptoms of covid, tested positive or been in contact with someone with symptoms/positive test in the past 5-10 days? NO

## 2020-05-24 ENCOUNTER — Other Ambulatory Visit (HOSPITAL_COMMUNITY): Payer: Self-pay

## 2020-05-24 ENCOUNTER — Encounter (HOSPITAL_COMMUNITY): Payer: Self-pay

## 2020-05-24 NOTE — Progress Notes (Signed)
Today had a home visit with Santana.  He states doing ok.  Looked at his toe that has been infected, lookes better, no redness, has a scab but looks like hitting end of shoe and pulling off, some blood underneath.  Asked if shoes were too small, he denied.  He does not have a bandaid on it, advised to place a bandaid with antibiotic ointment, he states he will.  He discussed he is taking medication to make his stool soft, where stool softners were not working.  He states has helped but still large and has to strain to go.  Discussed diet that will help and to drink more water than tea.  He is afraid of gaining weight since he is not as active and has quit eating supper.  He states he is eating crackers or something very small.  He states eating a larger lunch than a sandwich.  Advised him he needs the protein and nourishment.  Advised if not going to eat a supper to drink and ensure with his crackers.  He states he was drinking them but has not since hospital stay, he states will get his daughter to get some more.  He denies any chest pain, increased shortness of breath, headaches or dizziness.  He does some light house cleaning such as dishes, cleaning bathroom etc.  He is looking forward to going outside when warms.  He is starting to wean his oxygen off some, ok per pulmonology.  He has family support.  He appears to be in good mood.  He has palliative visiting.  His daughter takes him to appts and does his medications.  Lungs are clear, no wheezes or fluid heard.  Slight swelling in feet but none in ankles or legs.  Abdomen not tight.  Will continue to visit for heart failure, diet and medication compliance.   [place] [phone]

## 2020-06-07 ENCOUNTER — Encounter: Payer: Self-pay | Admitting: Nurse Practitioner

## 2020-06-07 ENCOUNTER — Other Ambulatory Visit: Payer: Self-pay

## 2020-06-07 ENCOUNTER — Other Ambulatory Visit: Payer: Medicare Other | Admitting: Nurse Practitioner

## 2020-06-07 DIAGNOSIS — K5900 Constipation, unspecified: Secondary | ICD-10-CM

## 2020-06-07 DIAGNOSIS — R0602 Shortness of breath: Secondary | ICD-10-CM

## 2020-06-07 DIAGNOSIS — Z515 Encounter for palliative care: Secondary | ICD-10-CM

## 2020-06-07 NOTE — Progress Notes (Signed)
[facility] Consult Note Telephone: [phone]  Fax: [phone]    Date of encounter: 06/07/20 PATIENT NAME: [name] [address] [place] [address]   [phone] (home) [phone] (work) DOB: 08/28/1928 MRN: [id number] PRIMARY CARE PROVIDER:    Baxter Hire, MD,  [place] [address] [phone]  RESPONSIBLE PARTY:    Contact Information    Name Relation Home Work Mobile   Kourtney, Montesinos Daughter   [phone]     Due to the COVID-19 crisis, this visit was done via telemedicine from my office and it was initiated and consent by this patient and or family.  I connected with Freddi Che daughter, then separate phone call to   [name] on 06/07/20 by a phone as video not available enabled telemedicine application and verified that I am speaking with the correct person using two identifiers.   I discussed the limitations of evaluation and management by telemedicine. The patient expressed understanding and agreed to proceed.  ASSESSMENT AND PLAN / RECOMMENDATIONS:   Symptom Management/Plan: 1.ACP: wishes for DNR;in vynca;   2. Shortness of breath, continue to monitor respiratory status; O2, nebulizers, pulmonary toileting  3. Cellulitis to top of right great toe, cellulitis improved per [facility] paramedic, refer to note; wound continues; discussed wound care, continue to use triple antibiotic ointment;   3. Constipation; improving discussed bowel regimen; increase daily oral fluids, discussed will send in senna s. Stop colace. DIscussed if senna s makes stools too loose then use senna s M/W/F; colace T/TH/Sa  4. CHF stable, continue with community paramedic program, daily weights, monitoring edema; continue to encourage to elevate legs  5. COPD stable; continue with O2, inhalation therapy, monitoring respiratory status, pulmonary  toileting  Follow up Palliative Care Visit: Palliative care will continue to follow for complex medical decision making, advance care planning, and clarification of goals. Return 4 weeks or prn.  I spent 50 minutes providing this consultation. More than 50% of the time in this consultation was spent in counseling and care coordination.  PPS: 40%  HOSPICE ELIGIBILITY/DIAGNOSIS: TBD  Chief Complaint: Palliative consult complex medical decision making  HISTORY OF PRESENT ILLNESS:  [name] is a 87 y.o. 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 I called [name], Mr. Pierpoint daughter, confirmed PC f/u visit telemedicine telephonic as video not available. [name] in agreement. [name] and I talked about how Mr. Inskeep has been doing. [name] endorses they have been working with the weight, with slight increase edema BLE for which he has been frequently elevating his legs. [name] endorses seems to be more times and longer periods of time he is elevating his legs. We talked about chronic disease progression of CHF, edema. Mr. Gut has not been short of breath. We talked about when to be concerned, when to contact [name] NP at CHF clinic. We talked about appetite being declined and food choices. We talked about gaining muscle weight in relation to gaining weight due to fluid. We talked about diet, nutritional counseling discussed. We talked about supplements including ensure and encouraged to start. We talked about Mr. Sluka toe which cellulitis appears to have resolved though wound continues to slowly heal. We talked about using triple antibiotic ointment vs airing the wound. We talked about upcoming appointment with Dr Edwina Barth and will recheck toe.  [name] endorses Mr. Kapur continues to keep his shoes and socks on throughout the  day. We talked about bowel regimen. We talked about symptoms. We reviewed medical goals of care. We talked about f/u PC visit, scheduled. [name] spoke of her decline in health as she is his primary caregiver. We talked about stress, caregiver fatigue with coping methods. Therapeutic listening, emotional support provided. I called Mr. Pusey. We talked about how he has been feeling. We talked about weight gain, discussed fluid vs muscle with nutritional education, discussed supplements. We talked about bowels, we talked about appetite. We talked about symptoms of pain/shortness of breath. Mr. Farooqui continues to try to take his oxygen off if he is at rest, monitoring his O2 sats. We talked about his toe wound, encourage to air out often as his activity consists of sitting in the chair most of the day. We talked about upcoming appointment with Dr Edwina Barth. We talked about role PC. Discussed with Mr. Finkler f/u pc visit made with [name]. Praised Mr. Bowlby for continuing to take one day at a time. Emotional support provided. Questions answered.    History obtained from review of EMR, discussion with interview with [name] and  Mr. Koenigs.  I reviewed available labs, medications, imaging, studies and related documents from the EMR.  Records reviewed and summarized above.   ROS Full 14 system review of systems performed and negative with exception of: as per HPI.  Physical Exam: Deferred  Questions and concerns were addressed. The patient/family was encouraged to call with questions and/or concerns. My contact information was provided. Provided general support and encouragement, no other unmet needs identified  Thank you for the opportunity to participate in the care of [name].  The palliative care team will continue to follow. Please call our office at [phone] if we can be of additional assistance.   This chart was dictated using voice recognition software. Despite best efforts to  proofread, errors can occur which can change the documentation meaning.  Tijana Walder Ihor Gully, NP , MSN,  [facility]

## 2020-06-15 ENCOUNTER — Encounter (HOSPITAL_COMMUNITY): Payer: Self-pay

## 2020-06-15 ENCOUNTER — Other Ambulatory Visit (HOSPITAL_COMMUNITY): Payer: Self-pay

## 2020-06-15 NOTE — Progress Notes (Signed)
Today Mr Tasker daughter contacted me.  She is telling me her Dad has swelling in ankles, feet and he is constipated.  Went to see MR Stanfid and he says he is doing ok.  Its been 2 days since bowel movement.  His abdomen is soft and non tender.  Consulted with Christin with Palliative care, she advised for him to take 2 daily for a couple of days til has bowel movement, then resume once a day.  He has edema in lower legs leaving sock marks and ankle edema.  Lungs are clear.  Consulted with [name] after getting his kidney function and she advised he could take 2 furosemide til Monday then go back to normal regiman.   Explained this to his Daughter kelly and she appears to understand, she repeated it back to me.  Will check on him next week and she is aware to call if has not went after 3 more days.  He denies any chest pain, headaches or dizziness. He is drinking 60 ounces of fluid a day.  He has been drinking protein shakes, that may be causing more constipation than his normal.  His diet is good.  He has everything for daily living.  Will continue to visit for heart failure, diet and medication compliance.   [place] [phone]

## 2020-06-20 ENCOUNTER — Other Ambulatory Visit (HOSPITAL_COMMUNITY): Payer: Self-pay

## 2020-06-20 ENCOUNTER — Encounter (HOSPITAL_COMMUNITY): Payer: Self-pay

## 2020-06-20 NOTE — Progress Notes (Signed)
Today had a home visit with Agam.  He states feels better than last week.  He has had a bowel movement today, he states still balls but softer.  He is taking senna plus daily and daughter has gotten him benifiber to take daily.  Appetite is good.  Edema has went down in legs, he took extra furosemide this weekend.  He states urinated more at times he went but did not go more times to urinate.  Abdomen is soft, denies chest pain, headaches, dizziness or increased shortness of breath.  He states he wants to do more but breathing will not allow.  He has all his medications and aware of how to take them.  His daughter stays most nights with him.  He does little things around the home.  He has decreased his activity a lot over past few months.  He has increased his water, his intake about 60 ounces now.  Will continue to visit for heart failure, diet and medication compliance.   [place] [phone]

## 2020-06-28 ENCOUNTER — Telehealth: Payer: Self-pay | Admitting: Nurse Practitioner

## 2020-06-28 NOTE — Telephone Encounter (Signed)
[employer] Paramedic made visit, updated on plan. Steffanie Dunn endorses she, [name], Mr. Henningsen daughter and Mr. Naves talked more about option of Hospice services and were interested in further discussion. Steffanie Dunn endorses Mr. Imperato did not want to stop his eliquis or trilogy inhaler. We talked about his toe and noted to have osteomyelitis. Discussed that at present time he is taking oral antibiotics but uncertain of further plan with ?IV antibiotics or sgy. Discussed if Mr. Ander wishes to proceed with more aggressive treatment would need to wait until completed before transitioning ot Hospice. Discussed will review case with Hospice Physicians and further discuss with [name], Mr. Kluender with eligibility and option if he could stay on eliquis with trilogy as previous inhalation therapy were not effective for symptom management of dyspnea.   Total time spent 20 minutes Documentation 5 minutes Phone discussion 15 minutes

## 2020-06-29 ENCOUNTER — Other Ambulatory Visit (INDEPENDENT_AMBULATORY_CARE_PROVIDER_SITE_OTHER): Payer: Self-pay | Admitting: Vascular Surgery

## 2020-06-29 DIAGNOSIS — L97519 Non-pressure chronic ulcer of other part of right foot with unspecified severity: Secondary | ICD-10-CM

## 2020-06-30 ENCOUNTER — Telehealth (INDEPENDENT_AMBULATORY_CARE_PROVIDER_SITE_OTHER): Payer: Self-pay

## 2020-06-30 ENCOUNTER — Ambulatory Visit (INDEPENDENT_AMBULATORY_CARE_PROVIDER_SITE_OTHER): Payer: Medicare Other | Admitting: Vascular Surgery

## 2020-06-30 ENCOUNTER — Encounter (INDEPENDENT_AMBULATORY_CARE_PROVIDER_SITE_OTHER): Payer: Self-pay | Admitting: Vascular Surgery

## 2020-06-30 ENCOUNTER — Other Ambulatory Visit: Payer: Self-pay

## 2020-06-30 ENCOUNTER — Ambulatory Visit (INDEPENDENT_AMBULATORY_CARE_PROVIDER_SITE_OTHER): Payer: Medicare Other

## 2020-06-30 VITALS — BP 158/76 | HR 85 | Ht 70.0 in | Wt 145.0 lb

## 2020-06-30 DIAGNOSIS — I2510 Atherosclerotic heart disease of native coronary artery without angina pectoris: Secondary | ICD-10-CM

## 2020-06-30 DIAGNOSIS — I10 Essential (primary) hypertension: Secondary | ICD-10-CM | POA: Diagnosis not present

## 2020-06-30 DIAGNOSIS — I482 Chronic atrial fibrillation, unspecified: Secondary | ICD-10-CM | POA: Diagnosis not present

## 2020-06-30 DIAGNOSIS — L97519 Non-pressure chronic ulcer of other part of right foot with unspecified severity: Secondary | ICD-10-CM | POA: Diagnosis not present

## 2020-06-30 DIAGNOSIS — I7025 Atherosclerosis of native arteries of other extremities with ulceration: Secondary | ICD-10-CM

## 2020-06-30 DIAGNOSIS — J449 Chronic obstructive pulmonary disease, unspecified: Secondary | ICD-10-CM

## 2020-06-30 NOTE — Telephone Encounter (Signed)
Spoke with the patient's daughter [name] and the patient is scheduled with Dr. Lucky Cowboy for a right leg angio on 07/05/20 with a 9:15 am arrival time to the MM. Pre-procedure instructions were discussed and will be mailed.

## 2020-06-30 NOTE — Progress Notes (Signed)
Patient ID: [name], male   DOB: Jul 29, 1928, 87 y.o.   MRN: [id number]  Chief Complaint  Patient presents with  . New Patient (Initial Visit)    NP skin ulcer of toe- RT foot W/  fat layer exposed acute osteomyelitis of rt ankle or ft referred by [name]. [name]     HPI [name] is a 87 y.o. male.  I am asked to see the patient by Dr. [name] for evaluation of a nonhealing ulceration on the right great toe.  This is quite painful.  Is been present for about 4 months without significant improvement.  His daughter who accompanies him today will describe that the wound will get better for a while and then get worse for a while.  No fevers or chills or signs of systemic infection.  There is no clear trauma, injury, or inciting event that started the wound.  He does have pain in that right leg with activity.  He denies any left leg symptoms at this time.  No wounds or ulcers on the left leg.  Noninvasive studies performed today demonstrate a right ABI 0.74 with monophasic waveforms.  His left ABI is 1.05 with monophasic waveforms that are stronger.  Even more concerning is the right digital pressure of 29 with a left digital pressure of 76.     Past Medical History:  Diagnosis Date  . CHF (congestive heart failure) ([place])   . COPD (chronic obstructive pulmonary disease) ([place])   . Coronary artery disease   . Hypertension     Past Surgical History:  Procedure Laterality Date  . Renal Bypass Surgery      Family History No family history of bleeding disorders, clotting disorders, autoimmune diseases, porphyria, or aneurysms   Social History   Tobacco Use  . Smoking status: Former Research scientist (life sciences)  . Smokeless tobacco: Never Used  Vaping Use  . Vaping Use: Never used  Substance Use Topics  . Alcohol use: Never  . Drug use: Never     No Known Allergies  Current Outpatient Medications  Medication Sig Dispense Refill  . albuterol (PROVENTIL) (2.5 MG/3ML) 0.083% nebulizer  solution Take 3 mLs (2.5 mg total) by nebulization every 6 (six) hours as needed for wheezing or shortness of breath. 75 mL 0  . albuterol (VENTOLIN HFA) 108 (90 Base) MCG/ACT inhaler Inhale into the lungs every 6 (six) hours as needed for wheezing or shortness of breath.    Marland Kitchen alendronate (FOSAMAX) 70 MG tablet Take 70 mg by mouth once a week.    Marland Kitchen amoxicillin-clavulanate (AUGMENTIN) 875-125 MG tablet Take 1 tablet by mouth 2 (two) times daily.    Marland Kitchen apixaban (ELIQUIS) 2.5 MG TABS tablet Take 2.5 mg by mouth 2 (two) times daily.    Marland Kitchen atorvastatin (LIPITOR) 20 MG tablet Take 20 mg by mouth daily.    Marland Kitchen CARTIA XT 300 MG 24 hr capsule Take 300 mg by mouth daily.    Marland Kitchen diltiazem (TIAZAC) 300 MG 24 hr capsule Take by mouth.    . fexofenadine (ALLEGRA) 180 MG tablet Take 180 mg by mouth daily.    . Fluticasone-Umeclidin-Vilant (TRELEGY ELLIPTA) 100-62.5-25 MCG/INH AEPB Inhale 1 puff into the lungs daily.     . furosemide (LASIX) 20 MG tablet TAKE 1 TABLET BY MOUTH DAILY 30 tablet 5  . metoprolol succinate (TOPROL-XL) 25 MG 24 hr tablet Take 1 tablet by mouth daily.    . metoprolol tartrate (LOPRESSOR) 50 MG tablet Take 50 mg  by mouth 2 (two) times daily.    . potassium chloride (KLOR-CON) 10 MEQ tablet Take 2 tablets (20 mEq total) by mouth daily. 60 tablet 5  . predniSONE (DELTASONE) 10 MG tablet Take 5 mg by mouth daily with breakfast.    . senna-docusate (SENOKOT-S) 8.6-50 MG tablet Take 1 tablet by mouth daily.    . Vitamin D, Ergocalciferol, (DRISDOL) 1.25 MG (50000 UNIT) CAPS capsule Take 1 capsule (50,000 Units total) by mouth every 7 (seven) days. 5 capsule 0  . traZODone (DESYREL) 50 MG tablet Take 50 mg by mouth at bedtime as needed for sleep.  (Patient not taking: No sig reported)     No current facility-administered medications for this visit.      REVIEW OF SYSTEMS (Negative unless checked)  Constitutional: [] Weight loss  [] Fever  [] Chills Cardiac: [] Chest pain   [] Chest pressure    [] Palpitations   [] Shortness of breath when laying flat   [] Shortness of breath at rest   [] Shortness of breath with exertion. Vascular:  [x] Pain in legs with walking   [] Pain in legs at rest   [] Pain in legs when laying flat   [x] Claudication   [] Pain in feet when walking  [x] Pain in feet at rest  [] Pain in feet when laying flat   [] History of DVT   [] Phlebitis   [] Swelling in legs   [] Varicose veins   [x] Non-healing ulcers Pulmonary:   [x] Uses home oxygen   [] Productive cough   [] Hemoptysis   [] Wheeze  [x] COPD   [] Asthma Neurologic:  [] Dizziness  [] Blackouts   [] Seizures   [] History of stroke   [] History of TIA  [] Aphasia   [] Temporary blindness   [] Dysphagia   [] Weakness or numbness in arms   [] Weakness or numbness in legs Musculoskeletal:  [x] Arthritis   [] Joint swelling   [] Joint pain   [] Low back pain Hematologic:  [] Easy bruising  [] Easy bleeding   [] Hypercoagulable state   [] Anemic  [] Hepatitis Gastrointestinal:  [] Blood in stool   [] Vomiting blood  [] Gastroesophageal reflux/heartburn   [] Abdominal pain Genitourinary:  [] Chronic kidney disease   [] Difficult urination  [] Frequent urination  [] Burning with urination   [] Hematuria Skin:  [] Rashes   [x] Ulcers   [x] Wounds Psychological:  [] History of anxiety   []  History of major depression.    Physical Exam BP (!) 158/76   Pulse 85   Ht 5\' 10"  (1.778 m)   Wt 145 lb (65.8 kg)   BMI 20.81 kg/m  Gen:  WD/WN, NAD.  Appears younger than stated age Head: [place]/AT, No temporalis wasting.  Ear/Nose/Throat: Hearing somewhat diminished, nares w/o erythema or drainage, oropharynx w/o Erythema/Exudate Eyes: Conjunctiva clear, sclera non-icteric  Neck: trachea midline.  No JVD.  Pulmonary:  Good air movement, respirations not labored, no use of accessory muscles on supplemental oxygen Cardiac: Irregular Vascular:  Vessel Right Left  Radial Palpable Palpable                          DP  not palpable  1+  PT  trace  1+   Gastrointestinal:.  No masses, surgical incisions, or scars. Musculoskeletal: M/S 5/5 throughout.  Sluggish capillary refill in the right foot.  No deformity or atrophy.  Small chronic ulceration on the right great toe.  No significant lower extremity edema. Neurologic: Sensation grossly intact in extremities.  Symmetrical.  Speech is fluent. Motor exam as listed above. Psychiatric: Judgment intact, Mood & affect appropriate for pt's clinical situation. Dermatologic: Fairly  clean ulceration of the right great toe without erythema and with minimal drainage.    Radiology No results found.  Labs No results found for this or any previous visit (from the past 2160 hour(s)).  Assessment/Plan:  Atherosclerosis of native arteries of the extremities with ulceration ([place]) Noninvasive studies performed today demonstrate a right ABI 0.74 with monophasic waveforms.  His left ABI is 1.05 with monophasic waveforms that are stronger.  Even more concerning is the right digital pressure of 29 with a left digital pressure of 76.   This represents a very critical and limb threatening situation for sure.  His wound is not going to heal with current level perfusion and he is at high risk of limb loss.  Even if he loses the toe, I would not expect that to heal with his level of perfusion.  I discussed the serious and critical nature of the situation and recommend an angiogram with possible revascularization.  With his oxygen dependent COPD and other comorbidities he is a very poor surgical candidate so we will do our best to get this taken care of endovascularly if at all possible.  I have discussed the risks and benefits of the procedure.  I discussed the reason and rationale for treatment.  They voiced their understanding and are agreeable to proceed  Atrial fibrillation, chronic (HCC) On anticoagulation  Hypertension blood pressure control important in reducing the progression of atherosclerotic disease. On appropriate oral  medications.   Coronary artery disease Continue cardiac and antihypertensive medications as already ordered and reviewed, no changes at this time.  Continue statin as ordered and reviewed, no changes at this time  Nitrates PRN for chest pain   COPD (chronic obstructive pulmonary disease) ([place]) Continue pulmonary medications and aerosols as already ordered, these medications have been reviewed and there are no changes at this time. His COPD is significant and he requires continuous oxygen so this would be a marked comorbidity and difficulty with his current situation significantly increasing his risk.       [name] 06/30/2020, 10:46 AM   This note was created with Dragon medical transcription system.  Any errors from dictation are unintentional.

## 2020-06-30 NOTE — Patient Instructions (Signed)
Angiogram  An angiogram is a procedure used to examine the blood vessels. In this procedure, contrast dye is injected through a long, thin tube (catheter) into an artery. X-rays are then taken, which show if there is a blockage or problem in a blood vessel. The catheter may be inserted in:  The groin area. This is the most common.  The fold of the arm, near the elbow.  The wrist. Tell a health care provider about:  Any allergies you have, including allergies to medicines, shellfish, contrast dye, or iodine.  All medicines you are taking, including vitamins, herbs, eye drops, creams, and over-the-counter medicines.  Any problems you or family members have had with anesthetic medicines.  Any blood disorders you have.  Any surgeries you have had.  Any medical conditions you have or have had, including any kidney problems or kidney failure.  Whether you are pregnant or may be pregnant.  Whether you are breastfeeding. What are the risks? Generally, this is a safe procedure. However, problems may occur, including:  Infection.  Bleeding and bruising.  Allergic reactions to medicines or dyes, including the contrast dye used.  Damage to nearby structures or organs, including damage to blood vessels and kidney damage from the contrast dye.  Blood clots that can lead to a stroke or heart attack. What happens before the procedure? Staying hydrated Follow instructions from your health care provider about hydration, which may include:  Up to 2 hours before the procedure - you may continue to drink clear liquids, such as water, clear fruit juice, black coffee, and plain tea.   Eating and drinking restrictions Follow instructions from your health care provider about eating and drinking, which may include:  8 hours before the procedure - stop eating heavy meals or foods, such as meat, fried foods, or fatty foods.  6 hours before the procedure - stop eating light meals or foods, such  as toast or cereal.  2 hours before the procedure - stop drinking clear liquids. Medicines Ask your health care provider about:  Changing or stopping your regular medicines. This is especially important if you are taking diabetes medicines or blood thinners.  Taking medicines such as aspirin and ibuprofen. These medicines can thin your blood. Do not take these medicines unless your health care provider tells you to take them.  Taking over-the-counter medicines, vitamins, herbs, and supplements. Surgery safety Ask your health care provider:  How your insertion site will be marked.  What steps will be taken to help prevent infection. These may include: ? Removing hair at the insertion site. ? Washing skin with a germ-killing soap. ? Taking antibiotic medicine. General instructions  Do not use any products that contain nicotine or tobacco for at least 4 weeks before the procedure. These products include cigarettes, e-cigarettes, and chewing tobacco. If you need help quitting, ask your health care provider.  You may have blood samples taken.  Plan to have someone take you home from the hospital or clinic.  If you will be going home right after the procedure, plan to have someone with you for 24 hours. What happens during the procedure?  You will lie on your back on an X-ray table. You may be strapped to the table if it is tilted.  An IV will be inserted into one of your veins.  Electrodes may be placed on your chest to monitor your heart rate during the procedure.  You will be given one or both of the following: ? A medicine   to help you relax (sedative). ? A medicine to numb the area where the catheter will be inserted (local anesthetic).  A small incision will be made for catheter insertion.  The catheter will be inserted into an artery using a guide wire. An X-ray procedure (fluoroscopy) will be used to help guide the catheter to the blood vessel to be examined.  A contrast  dye will then be injected into the catheter, and X-rays will be taken. The contrast will help to show where any narrowing or blockages are located in the blood vessels. You may feel flushed as the contrast dye is injected.  Tell your health care provider if you develop chest pain or trouble breathing.  After the fluoroscopy is complete, the catheter will be removed.  A bandage (dressing) will be placed over the site where the catheter was inserted. Pressure will be applied to help stop any bleeding.  The IV will be removed. The procedure may vary among health care providers and hospitals. What happens after the procedure?  Your blood pressure, heart rate, breathing rate, and blood oxygen level will be monitored until you leave the hospital or clinic.  You will be kept in bed lying flat for 6 hours. If the catheter was inserted through your leg, you will be instructed not to bend or cross your legs.  The insertion area and the pulse in your feet or wrist will be checked frequently.  You will be instructed to drink plenty of fluids. This will help wash the contrast dye out of your body.  You may have more blood tests and X-rays. You may also have a test that records the electrical activity of your heart (electrocardiogram, or ECG).  Do not drive for 24 hours if you were given a sedative during your procedure.  It is up to you to get the results of your procedure. Ask your health care provider, or the department that is doing the procedure, when your results will be ready. Summary  An angiogram is a procedure used to examine the blood vessels.  Before the procedure, follow your health care provider's instructions about eating and drinking restrictions. You may be asked to stop eating and drinking several hours before the procedure.  During the procedure, contrast dye is injected through a thin tube (catheter) into an artery. X-rays are then taken.  After the procedure, you will need to  drink plenty of fluids and lie flat for 6 hour. This information is not intended to replace advice given to you by your health care provider. Make sure you discuss any questions you have with your health care provider. Document Revised: 07/29/2018 Document Reviewed: 07/29/2018 Elsevier Patient Education  2021 [employer]  

## 2020-06-30 NOTE — Assessment & Plan Note (Signed)
blood pressure control important in reducing the progression of atherosclerotic disease. On appropriate oral medications.  

## 2020-06-30 NOTE — Assessment & Plan Note (Signed)
Noninvasive studies performed today demonstrate a right ABI 0.74 with monophasic waveforms.  His left ABI is 1.05 with monophasic waveforms that are stronger.  Even more concerning is the right digital pressure of 29 with a left digital pressure of 76.   This represents a very critical and limb threatening situation for sure.  His wound is not going to heal with current level perfusion and he is at high risk of limb loss.  Even if he loses the toe, I would not expect that to heal with his level of perfusion.  I discussed the serious and critical nature of the situation and recommend an angiogram with possible revascularization.  With his oxygen dependent COPD and other comorbidities he is a very poor surgical candidate so we will do our best to get this taken care of endovascularly if at all possible.  I have discussed the risks and benefits of the procedure.  I discussed the reason and rationale for treatment.  They voiced their understanding and are agreeable to proceed

## 2020-06-30 NOTE — Assessment & Plan Note (Signed)
Continue cardiac and antihypertensive medications as already ordered and reviewed, no changes at this time. Continue statin as ordered and reviewed, no changes at this time Nitrates PRN for chest pain  

## 2020-06-30 NOTE — Assessment & Plan Note (Signed)
Continue pulmonary medications and aerosols as already ordered, these medications have been reviewed and there are no changes at this time. His COPD is significant and he requires continuous oxygen so this would be a marked comorbidity and difficulty with his current situation significantly increasing his risk.

## 2020-06-30 NOTE — Assessment & Plan Note (Signed)
On anticoagulation 

## 2020-06-30 NOTE — H&P (View-Only) (Signed)
Patient ID: [name], male   DOB: [birth year], 87 y.o.   MRN: [id number]  Chief Complaint  Patient presents with  . New Patient (Initial Visit)    NP skin ulcer of toe- RT foot W/  fat layer exposed acute osteomyelitis of rt ankle or ft referred by [name]. [name]     HPI [name] is a 87 y.o. male.  I am asked to see the patient by Dr. Cleda Good for evaluation of a nonhealing ulceration on the right great toe.  This is quite painful.  Is been present for about 4 months without significant improvement.  His daughter who accompanies him today will describe that the wound will get better for a while and then get worse for a while.  No fevers or chills or signs of systemic infection.  There is no clear trauma, injury, or inciting event that started the wound.  He does have pain in that right leg with activity.  He denies any left leg symptoms at this time.  No wounds or ulcers on the left leg.  Noninvasive studies performed today demonstrate a right ABI 0.74 with monophasic waveforms.  His left ABI is 1.05 with monophasic waveforms that are stronger.  Even more concerning is the right digital pressure of 29 with a left digital pressure of 76.     Past Medical History:  Diagnosis Date  . CHF (congestive heart failure) ([place])   . COPD (chronic obstructive pulmonary disease) ([place])   . Coronary artery disease   . Hypertension     Past Surgical History:  Procedure Laterality Date  . Renal Bypass Surgery      Family History No family history of bleeding disorders, clotting disorders, autoimmune diseases, porphyria, or aneurysms   Social History   Tobacco Use  . Smoking status: Former Research scientist (life sciences)  . Smokeless tobacco: Never Used  Vaping Use  . Vaping Use: Never used  Substance Use Topics  . Alcohol use: Never  . Drug use: Never     No Known Allergies  Current Outpatient Medications  Medication Sig Dispense Refill  . albuterol (PROVENTIL) (2.5 MG/3ML) 0.083% nebulizer  solution Take 3 mLs (2.5 mg total) by nebulization every 6 (six) hours as needed for wheezing or shortness of breath. 75 mL 0  . albuterol (VENTOLIN HFA) 108 (90 Base) MCG/ACT inhaler Inhale into the lungs every 6 (six) hours as needed for wheezing or shortness of breath.    Marland Kitchen alendronate (FOSAMAX) 70 MG tablet Take 70 mg by mouth once a week.    Marland Kitchen amoxicillin-clavulanate (AUGMENTIN) 875-125 MG tablet Take 1 tablet by mouth 2 (two) times daily.    Marland Kitchen apixaban (ELIQUIS) 2.5 MG TABS tablet Take 2.5 mg by mouth 2 (two) times daily.    Marland Kitchen atorvastatin (LIPITOR) 20 MG tablet Take 20 mg by mouth daily.    Marland Kitchen CARTIA XT 300 MG 24 hr capsule Take 300 mg by mouth daily.    Marland Kitchen diltiazem (TIAZAC) 300 MG 24 hr capsule Take by mouth.    . fexofenadine (ALLEGRA) 180 MG tablet Take 180 mg by mouth daily.    . Fluticasone-Umeclidin-Vilant (TRELEGY ELLIPTA) 100-62.5-25 MCG/INH AEPB Inhale 1 puff into the lungs daily.     . furosemide (LASIX) 20 MG tablet TAKE 1 TABLET BY MOUTH DAILY 30 tablet 5  . metoprolol succinate (TOPROL-XL) 25 MG 24 hr tablet Take 1 tablet by mouth daily.    . metoprolol tartrate (LOPRESSOR) 50 MG tablet Take 50 mg  by mouth 2 (two) times daily.    . potassium chloride (KLOR-CON) 10 MEQ tablet Take 2 tablets (20 mEq total) by mouth daily. 60 tablet 5  . predniSONE (DELTASONE) 10 MG tablet Take 5 mg by mouth daily with breakfast.    . senna-docusate (SENOKOT-S) 8.6-50 MG tablet Take 1 tablet by mouth daily.    . Vitamin D, Ergocalciferol, (DRISDOL) 1.25 MG (50000 UNIT) CAPS capsule Take 1 capsule (50,000 Units total) by mouth every 7 (seven) days. 5 capsule 0  . traZODone (DESYREL) 50 MG tablet Take 50 mg by mouth at bedtime as needed for sleep.  (Patient not taking: No sig reported)     No current facility-administered medications for this visit.      REVIEW OF SYSTEMS (Negative unless checked)  Constitutional: [] Weight loss  [] Fever  [] Chills Cardiac: [] Chest pain   [] Chest pressure    [] Palpitations   [] Shortness of breath when laying flat   [] Shortness of breath at rest   [] Shortness of breath with exertion. Vascular:  [x] Pain in legs with walking   [] Pain in legs at rest   [] Pain in legs when laying flat   [x] Claudication   [] Pain in feet when walking  [x] Pain in feet at rest  [] Pain in feet when laying flat   [] History of DVT   [] Phlebitis   [] Swelling in legs   [] Varicose veins   [x] Non-healing ulcers Pulmonary:   [x] Uses home oxygen   [] Productive cough   [] Hemoptysis   [] Wheeze  [x] COPD   [] Asthma Neurologic:  [] Dizziness  [] Blackouts   [] Seizures   [] History of stroke   [] History of TIA  [] Aphasia   [] Temporary blindness   [] Dysphagia   [] Weakness or numbness in arms   [] Weakness or numbness in legs Musculoskeletal:  [x] Arthritis   [] Joint swelling   [] Joint pain   [] Low back pain Hematologic:  [] Easy bruising  [] Easy bleeding   [] Hypercoagulable state   [] Anemic  [] Hepatitis Gastrointestinal:  [] Blood in stool   [] Vomiting blood  [] Gastroesophageal reflux/heartburn   [] Abdominal pain Genitourinary:  [] Chronic kidney disease   [] Difficult urination  [] Frequent urination  [] Burning with urination   [] Hematuria Skin:  [] Rashes   [x] Ulcers   [x] Wounds Psychological:  [] History of anxiety   []  History of major depression.    Physical Exam BP (!) 158/76   Pulse 85   Ht 5\' 10"  (1.778 m)   Wt 145 lb (65.8 kg)   BMI 20.81 kg/m  Gen:  WD/WN, NAD.  Appears younger than stated age Head: [place]/AT, No temporalis wasting.  Ear/Nose/Throat: Hearing somewhat diminished, nares w/o erythema or drainage, oropharynx w/o Erythema/Exudate Eyes: Conjunctiva clear, sclera non-icteric  Neck: trachea midline.  No JVD.  Pulmonary:  Good air movement, respirations not labored, no use of accessory muscles on supplemental oxygen Cardiac: Irregular Vascular:  Vessel Right Left  Radial Palpable Palpable                          DP  not palpable  1+  PT  trace  1+   Gastrointestinal:.  No masses, surgical incisions, or scars. Musculoskeletal: M/S 5/5 throughout.  Sluggish capillary refill in the right foot.  No deformity or atrophy.  Small chronic ulceration on the right great toe.  No significant lower extremity edema. Neurologic: Sensation grossly intact in extremities.  Symmetrical.  Speech is fluent. Motor exam as listed above. Psychiatric: Judgment intact, Mood & affect appropriate for pt's clinical situation. Dermatologic: Fairly  clean ulceration of the right great toe without erythema and with minimal drainage.    Radiology No results found.  Labs No results found for this or any previous visit (from the past 2160 hour(s)).  Assessment/Plan:  Atherosclerosis of native arteries of the extremities with ulceration ([place]) Noninvasive studies performed today demonstrate a right ABI 0.74 with monophasic waveforms.  His left ABI is 1.05 with monophasic waveforms that are stronger.  Even more concerning is the right digital pressure of 29 with a left digital pressure of 76.   This represents a very critical and limb threatening situation for sure.  His wound is not going to heal with current level perfusion and he is at high risk of limb loss.  Even if he loses the toe, I would not expect that to heal with his level of perfusion.  I discussed the serious and critical nature of the situation and recommend an angiogram with possible revascularization.  With his oxygen dependent COPD and other comorbidities he is a very poor surgical candidate so we will do our best to get this taken care of endovascularly if at all possible.  I have discussed the risks and benefits of the procedure.  I discussed the reason and rationale for treatment.  They voiced their understanding and are agreeable to proceed  Atrial fibrillation, chronic (HCC) On anticoagulation  Hypertension blood pressure control important in reducing the progression of atherosclerotic disease. On appropriate oral  medications.   Coronary artery disease Continue cardiac and antihypertensive medications as already ordered and reviewed, no changes at this time.  Continue statin as ordered and reviewed, no changes at this time  Nitrates PRN for chest pain   COPD (chronic obstructive pulmonary disease) ([place]) Continue pulmonary medications and aerosols as already ordered, these medications have been reviewed and there are no changes at this time. His COPD is significant and he requires continuous oxygen so this would be a marked comorbidity and difficulty with his current situation significantly increasing his risk.       [name] 06/30/2020, 10:46 AM   This note was created with Dragon medical transcription system.  Any errors from dictation are unintentional.

## 2020-07-02 NOTE — Progress Notes (Deleted)
Patient ID: [name], male    DOB: [birth year], 87 y.o.   MRN: [id number]  HPI  Mr Bou is a 87 y/o male with a history of CAD, HTN, previous tobacco use and chronic heart failure.   Echo report from 10/10/19 reviewed and showed an EF of 55-60% along with moderately elevated PA pressure, severe LAE/ RAE and mild MR. Echo report from 01/06/2019 reviewed and showed an EF of 55-60% along with mild/ moderate MR and moderately elevated PA pressure.   Has not been admitted or been in the ED in the last 6 months.   He presents today for a follow-up visit with a chief complaint of          Past Medical History:  Diagnosis Date  . CHF (congestive heart failure) ([place])   . COPD (chronic obstructive pulmonary disease) ([place])   . Coronary artery disease   . Hypertension    Past Surgical History:  Procedure Laterality Date  . Renal Bypass Surgery     No family history on file. Social History   Tobacco Use  . Smoking status: Former Research scientist (life sciences)  . Smokeless tobacco: Never Used  Substance Use Topics  . Alcohol use: Never   No Known Allergies     Review of Systems  Constitutional: Positive for fatigue. Negative for appetite change.  HENT: Negative for congestion, postnasal drip and sore throat.   Eyes: Negative.   Respiratory: Positive for cough (after using nebulizer) and shortness of breath (with moderate exertion). Negative for wheezing.   Cardiovascular: Negative for chest pain, palpitations and leg swelling.  Gastrointestinal: Negative for abdominal distention and abdominal pain.  Endocrine: Negative.   Genitourinary: Negative.   Musculoskeletal: Negative for back pain and neck pain.  Skin: Negative.   Allergic/Immunologic: Negative.   Neurological: Negative for dizziness and light-headedness.  Hematological: Negative for adenopathy. Does not bruise/bleed easily.  Psychiatric/Behavioral: Negative for dysphoric mood and sleep disturbance (sleeping on 2 pillows). The patient  is not nervous/anxious.      Physical Exam Vitals and nursing note reviewed.  Constitutional:      Appearance: He is well-developed.  HENT:     Head: Normocephalic and atraumatic.  Neck:     Vascular: No JVD.  Cardiovascular:     Rate and Rhythm: Normal rate and regular rhythm.  Pulmonary:     Effort: Pulmonary effort is normal. No respiratory distress.     Breath sounds: Wheezing (bilateral lower lobes) present. No rales.  Abdominal:     Palpations: Abdomen is soft.     Tenderness: There is no abdominal tenderness.  Musculoskeletal:     Cervical back: Normal range of motion and neck supple.     Right lower leg: No tenderness. No edema.     Left lower leg: No tenderness. No edema.  Skin:    General: Skin is warm and dry.  Neurological:     General: No focal deficit present.     Mental Status: He is alert and oriented to person, place, and time.  Psychiatric:        Mood and Affect: Mood normal.        Behavior: Behavior normal.    Assessment & Plan:  1: Chronic heart failure with preserved ejection fraction- - NYHA class II - euvolemic today - weighing daily although he questions accuracy and needs to get a new set; set of scales given to him today; reminded to call for an overnight weight gain of >2 pounds or  a weekly weight gain of >5 pounds - weight 148.4 pounds from last visit here 6 months ago - not adding salt but does like Nature conservation [employer] from [employer] or curly fries from [employer]; reviewed the importance of looking at food labels so that he can keep daily sodium intake to 2000mg  / day - saw cardiology (Fath) 05/08/20 - saw palliative care 06/07/20 - participating in paramedicine program - BNP 10/08/19 was 1004.8  2: HTN- - BP  - saw PCP Edwina Barth) 06/14/20 - CMP 06/14/20 reviewed and showed sodium 138, potassium 4.3, creatinine 1.0 and GFR 70  3: COPD/ PAH- - wearing oxygen at 2.5L around the clock - saw pulmonology Raul Del) 04/21/20 - using nebulizer 4  times/ day   Medication list was reviewed with patient and daughter.

## 2020-07-03 ENCOUNTER — Ambulatory Visit: Payer: Medicare Other | Admitting: Family

## 2020-07-05 ENCOUNTER — Encounter
Admission: RE | Disposition: A | Discharge status: Home / Self Care | Payer: Self-pay | Source: Home / Self Care | Attending: Vascular Surgery

## 2020-07-05 ENCOUNTER — Encounter: Payer: Self-pay | Admitting: Vascular Surgery

## 2020-07-05 ENCOUNTER — Ambulatory Visit
Admission: RE | Admit: 2020-07-05 | Discharge: 2020-07-05 | Disposition: A | Discharge status: Home / Self Care | Payer: Medicare Other | Attending: Vascular Surgery | Admitting: Vascular Surgery

## 2020-07-05 ENCOUNTER — Other Ambulatory Visit (INDEPENDENT_AMBULATORY_CARE_PROVIDER_SITE_OTHER): Payer: Self-pay | Admitting: Nurse Practitioner

## 2020-07-05 ENCOUNTER — Other Ambulatory Visit: Payer: Self-pay

## 2020-07-05 DIAGNOSIS — I70235 Atherosclerosis of native arteries of right leg with ulceration of other part of foot: Secondary | ICD-10-CM | POA: Insufficient documentation

## 2020-07-05 DIAGNOSIS — I2510 Atherosclerotic heart disease of native coronary artery without angina pectoris: Secondary | ICD-10-CM | POA: Insufficient documentation

## 2020-07-05 DIAGNOSIS — Z79899 Other long term (current) drug therapy: Secondary | ICD-10-CM | POA: Diagnosis not present

## 2020-07-05 DIAGNOSIS — I110 Hypertensive heart disease with heart failure: Secondary | ICD-10-CM | POA: Insufficient documentation

## 2020-07-05 DIAGNOSIS — L97909 Non-pressure chronic ulcer of unspecified part of unspecified lower leg with unspecified severity: Secondary | ICD-10-CM

## 2020-07-05 DIAGNOSIS — Z7901 Long term (current) use of anticoagulants: Secondary | ICD-10-CM | POA: Diagnosis not present

## 2020-07-05 DIAGNOSIS — M86171 Other acute osteomyelitis, right ankle and foot: Secondary | ICD-10-CM | POA: Diagnosis not present

## 2020-07-05 DIAGNOSIS — J449 Chronic obstructive pulmonary disease, unspecified: Secondary | ICD-10-CM | POA: Insufficient documentation

## 2020-07-05 DIAGNOSIS — Z87891 Personal history of nicotine dependence: Secondary | ICD-10-CM | POA: Diagnosis not present

## 2020-07-05 DIAGNOSIS — Z9981 Dependence on supplemental oxygen: Secondary | ICD-10-CM | POA: Insufficient documentation

## 2020-07-05 DIAGNOSIS — L97512 Non-pressure chronic ulcer of other part of right foot with fat layer exposed: Secondary | ICD-10-CM | POA: Diagnosis not present

## 2020-07-05 DIAGNOSIS — I482 Chronic atrial fibrillation, unspecified: Secondary | ICD-10-CM | POA: Diagnosis not present

## 2020-07-05 DIAGNOSIS — I509 Heart failure, unspecified: Secondary | ICD-10-CM | POA: Insufficient documentation

## 2020-07-05 DIAGNOSIS — L97519 Non-pressure chronic ulcer of other part of right foot with unspecified severity: Secondary | ICD-10-CM

## 2020-07-05 DIAGNOSIS — I70299 Other atherosclerosis of native arteries of extremities, unspecified extremity: Secondary | ICD-10-CM

## 2020-07-05 HISTORY — PX: LOWER EXTREMITY ANGIOGRAPHY: CATH118251

## 2020-07-05 LAB — CREATININE, SERUM
Creatinine, Ser: 1 mg/dL (ref 0.61–1.24)
GFR, Estimated: >60 mL/min (ref >60)

## 2020-07-05 LAB — BUN: BUN: 23 mg/dL (ref 8–23)

## 2020-07-05 SURGERY — LOWER EXTREMITY ANGIOGRAPHY
Anesthesia: Moderate Sedation | Laterality: Right

## 2020-07-05 MED ORDER — HYDROMORPHONE HCL 1 MG/ML IJ SOLN: 1.0000 mg | Freq: Once | INTRAMUSCULAR | Status: DC | PRN

## 2020-07-05 MED ORDER — FAMOTIDINE 20 MG PO TABS: 40.0000 mg | ORAL_TABLET | Freq: Once | ORAL | Status: DC | PRN

## 2020-07-05 MED ORDER — HEPARIN SODIUM (PORCINE) 1000 UNIT/ML IJ SOLN
INTRAMUSCULAR | Status: AC
  Filled 2020-07-05: qty 1

## 2020-07-05 MED ORDER — FENTANYL CITRATE (PF) 100 MCG/2ML IJ SOLN
INTRAMUSCULAR | Status: AC
  Filled 2020-07-05: qty 2

## 2020-07-05 MED ORDER — IODIXANOL 320 MG/ML IV SOLN
INTRAVENOUS | Status: DC | PRN
  Administered 2020-07-05: 40 mL

## 2020-07-05 MED ORDER — DIPHENHYDRAMINE HCL 50 MG/ML IJ SOLN: 50.0000 mg | Freq: Once | INTRAMUSCULAR | Status: DC | PRN

## 2020-07-05 MED ORDER — MIDAZOLAM HCL 2 MG/ML PO SYRP: 8.0000 mg | ORAL_SOLUTION | Freq: Once | ORAL | Status: DC | PRN

## 2020-07-05 MED ORDER — ONDANSETRON HCL 4 MG/2ML IJ SOLN: 4.0000 mg | Freq: Four times a day (QID) | INTRAMUSCULAR | Status: DC | PRN

## 2020-07-05 MED ORDER — MIDAZOLAM HCL 5 MG/5ML IJ SOLN
INTRAMUSCULAR | Status: AC
  Filled 2020-07-05: qty 5

## 2020-07-05 MED ORDER — SODIUM CHLORIDE 0.9 % IV SOLN: INTRAVENOUS | Status: DC

## 2020-07-05 MED ORDER — CEFAZOLIN SODIUM-DEXTROSE 2-4 GM/100ML-% IV SOLN: 2.0000 g | Freq: Once | INTRAVENOUS | Status: DC

## 2020-07-05 MED ORDER — FENTANYL CITRATE (PF) 100 MCG/2ML IJ SOLN
INTRAMUSCULAR | Status: DC | PRN
  Administered 2020-07-05: 50 ug via INTRAVENOUS

## 2020-07-05 MED ORDER — METHYLPREDNISOLONE SODIUM SUCC 125 MG IJ SOLR: 125.0000 mg | Freq: Once | INTRAMUSCULAR | Status: DC | PRN

## 2020-07-05 MED ORDER — MIDAZOLAM HCL 2 MG/2ML IJ SOLN
INTRAMUSCULAR | Status: DC | PRN
  Administered 2020-07-05: 1 mg via INTRAVENOUS

## 2020-07-05 SURGICAL SUPPLY — 16 items
CATH ANGIO 5F 80CM MHK 2 (CATHETERS) ×2 IMPLANT
CATH ANGIO 5F PIGTAIL 65CM (CATHETERS) ×2 IMPLANT
CATH TEMPO 5F RIM 65CM (CATHETERS) ×2 IMPLANT
COVER PROBE U/S 5X48 (MISCELLANEOUS) ×2 IMPLANT
DEVICE STARCLOSE SE CLOSURE (Vascular Products) ×2 IMPLANT
DEVICE TORQUE (MISCELLANEOUS) ×2 IMPLANT
GLIDECATH 4FR STR (CATHETERS) ×2 IMPLANT
GLIDEWIRE STIFF .35X180X3 HYDR (WIRE) ×2 IMPLANT
GUIDEWIRE ANGLED .035 180CM (WIRE) ×2 IMPLANT
GUIDEWIRE SUPER STIFF .035X180 (WIRE) ×2 IMPLANT
PACK ANGIOGRAPHY (CUSTOM PROCEDURE TRAY) ×3 IMPLANT
SHEATH BRITE TIP 4FRX11 (SHEATH) ×2 IMPLANT
SHEATH BRITE TIP 5FRX11 (SHEATH) ×2 IMPLANT
SYR MEDRAD MARK 7 150ML (SYRINGE) ×2 IMPLANT
TUBING CONTRAST HIGH PRESS 48 (TUBING) ×4 IMPLANT
WIRE GUIDERIGHT .035X150 (WIRE) ×2 IMPLANT

## 2020-07-05 NOTE — Progress Notes (Signed)
Dr. Lucky Cowboy at bedside. Dr. Lucky Cowboy states that patient can be discharged on 4L of 02 at this time. Patient remains in no acute distress. Lung sounds remain clear but diminished at this time.

## 2020-07-05 NOTE — Op Note (Signed)
[facility]  Percutaneous Study/Intervention Procedural Note   Date of Surgery: 07/05/2020  Surgeon(s):Amberley Hamler    Assistants:none  Pre-operative Diagnosis: PAD with ulceration right lower extremity  Post-operative diagnosis:  Same  Procedure(s) Performed:             1.  Ultrasound guidance for vascular access left femoral artery             2.  Catheter placement into right limb of aortobifemoral bypass from left femoral approach             3.  Aortogram and selective right lower extremity angiogram             4.  StarClose closure device left femoral artery  EBL: 5 cc  Contrast: 40 cc  Fluoro Time: 3.9 minutes  Moderate Conscious Sedation Time: approximately 31 minutes using 1 mg of Versed and 50 mcg of Fentanyl              Indications:  Patient is a 87 y.o.male with a nonhealing ulceration of the right foot with pain. The patient has noninvasive study showing markedly reduced flow in the right leg with a digit pressure in the 20s.  The left leg also had reduction in flow distally but it was better than the right. The patient is brought in for angiography for further evaluation and potential treatment.  Due to the limb threatening nature of the situation, angiogram was performed for attempted limb salvage. The patient is aware that if the procedure fails, amputation would be expected.  The patient also understands that even with successful revascularization, amputation may still be required due to the severity of the situation. Risks and benefits are discussed and informed consent is obtained.   Procedure:  The patient was identified and appropriate procedural time out was performed.  The patient was then placed supine on the table and prepped and draped in the usual sterile fashion. Moderate conscious sedation was administered during a face to face encounter with the patient throughout the procedure with my supervision of the RN administering medicines and  monitoring the patient's vital signs, pulse oximetry, telemetry and mental status throughout from the start of the procedure until the patient was taken to the recovery room. Ultrasound was used to evaluate the left common femoral artery.  It was patent but somewhat diseased.  A digital ultrasound image was acquired.  A Seldinger needle was used to access the left common femoral artery under direct ultrasound guidance at the junction of the distal portion of the aortobifemoral bypass to the left femoral artery and a permanent image was performed.  A 0.035 J wire was advanced without resistance and a 5Fr sheath was placed.  Pigtail catheter was placed into the aorta and an AP aortogram was performed. This demonstrated a patent aortobifemoral bypass with some mild narrowing of the aorta just above the bypass that did not appear flow-limiting.  There appeared to be 2 right renal arteries without obvious stenosis.  There was a patent left renal artery and what appeared to be an occluded stent and another left renal artery.  Attempts to cross the aortobifemoral bypass and get a catheter down to the right femoral head were not successful due to the steep nature of the bypass which was not surprising.  A V S1 catheter was used to hook the aortobifemoral bypass bifurcation and the catheter tip was a few centimeters into the right limb of the aortobifemoral bypass perform selective right  lower extremity imaging.  Selective right lower extremity angiogram was then performed. This demonstrated the bypass limb was open, but the common femoral artery it was anastomosed to was occluded just below the bypass with a large number of large collaterals present.  There was occlusion in the proximal portion of the profunda femoris artery.  The SFA was totally occluded and did not reconstitute until a faint to below-knee popliteal artery which appeared diseased.  Distally, there appeared to be a diseased anterior tibial artery and  peroneal artery is runoff, but the image quality was faint and very poor so it was difficult to tell how much disease these vessels had.  This was not going to be amenable to endovascular therapy and the patient will require a right femoral endarterectomy and potentially other therapies as well. I elected to terminate the procedure. The sheath was removed and StarClose closure device was deployed in the left femoral artery with excellent hemostatic result. The patient was taken to the recovery room in stable condition having tolerated the procedure well.  Findings:               Aortogram:  Patent aortobifemoral bypass with some mild narrowing of the aorta just above the bypass that did not appear flow-limiting.  There appeared to be 2 right renal arteries without obvious stenosis.  There was a patent left renal artery and what appeared to be an occluded stent and another left renal artery.             Right lower Extremity:  This demonstrated the bypass limb was open, but the common femoral artery it was anastomosed to was occluded just below the bypass with a large number of large collaterals present.  There was occlusion in the proximal portion of the profunda femoris artery.  The SFA was totally occluded and did not reconstitute until a faint to below-knee popliteal artery which appeared diseased.  Distally, there appeared to be a diseased anterior tibial artery and peroneal artery is runoff, but the image quality was faint and very poor so it was difficult to tell how much disease these vessels had   Disposition: Patient was taken to the recovery room in stable condition having tolerated the procedure well.  Complications: None  [name] 07/05/2020 11:16 AM   This note was created with Dragon Medical transcription system. Any errors in dictation are purely unintentional.

## 2020-07-05 NOTE — Interval H&P Note (Signed)
History and Physical Interval Note:  07/05/2020 9:21 AM  [name]  has presented today for surgery, with the diagnosis of RT Lower Extremity Angiography   ASO with ulceration   BARD Rep cc: [name].  The various methods of treatment have been discussed with the patient and family. After consideration of risks, benefits and other options for treatment, the patient has consented to  Procedure(s): LOWER EXTREMITY ANGIOGRAPHY (Right) as a surgical intervention.  The patient's history has been reviewed, patient examined, no change in status, stable for surgery.  I have reviewed the patient's chart and labs.  Questions were answered to the patient's satisfaction.     [name]

## 2020-07-14 ENCOUNTER — Encounter (INDEPENDENT_AMBULATORY_CARE_PROVIDER_SITE_OTHER): Payer: Self-pay | Admitting: Vascular Surgery

## 2020-07-14 ENCOUNTER — Other Ambulatory Visit: Payer: Self-pay

## 2020-07-14 ENCOUNTER — Ambulatory Visit (INDEPENDENT_AMBULATORY_CARE_PROVIDER_SITE_OTHER): Payer: Medicare Other | Admitting: Vascular Surgery

## 2020-07-14 VITALS — BP 158/90 | HR 76 | Resp 16

## 2020-07-14 DIAGNOSIS — I10 Essential (primary) hypertension: Secondary | ICD-10-CM | POA: Diagnosis not present

## 2020-07-14 DIAGNOSIS — I7025 Atherosclerosis of native arteries of other extremities with ulceration: Secondary | ICD-10-CM

## 2020-07-14 DIAGNOSIS — E785 Hyperlipidemia, unspecified: Secondary | ICD-10-CM | POA: Diagnosis not present

## 2020-07-14 DIAGNOSIS — J449 Chronic obstructive pulmonary disease, unspecified: Secondary | ICD-10-CM | POA: Diagnosis not present

## 2020-07-14 NOTE — Assessment & Plan Note (Signed)
blood pressure control important in reducing the progression of atherosclerotic disease. On appropriate oral medications.  

## 2020-07-14 NOTE — Assessment & Plan Note (Signed)
Significant.  On oxygen.  Will limit our surgery and I would not do a long leg bypass as I do not think he would tolerate this.

## 2020-07-14 NOTE — Assessment & Plan Note (Signed)
At angiography, he was found to have occlusion of the right common femoral artery, proximal profunda femoris artery, and the entire superficial femoral artery just below a previous aortobifemoral bypass. We had a long discussion with he and his daughter again today just as we did after his angiogram earlier this month.  His blood flow was an adequate for healing the wound on the right foot and this is clearly a limb threatening situation and without any intervention, at some point once significant infection or gangrenous changes develop he will lose the right leg.  Not only would he lose the right leg, with this flow as it is currently, this would be a very high above-knee amputation and will have difficulty healing due to the poor inflow.  In addition, it is very surprising his aortobifemoral bypass limb is still open as there is no outflow for the limb.  If this thrombosis and he loses the collaterals from the circumflex vessels, he will have an acute limb threatening ischemia that will be very difficult to get him to survive given his comorbidities.  This would likely require hip disarticulation.  I do not think he would tolerate that.  Full revascularization would be a femoral endarterectomy and a Pham distal bypass, but I do not think his physiology is suitable for a long surgery such as that.  I do think he will tolerate a femoral endarterectomy which should help keep the bypass limb open and significantly improve the perfusion to the right foot.  This will get a chance for wound healing and at least keep an amputation at a lower level if required.  He and his daughter have many questions and we did our best to answer these today.  They voiced their understanding and he desires to proceed.

## 2020-07-14 NOTE — Assessment & Plan Note (Signed)
lipid control important in reducing the progression of atherosclerotic disease. Continue statin therapy  

## 2020-07-14 NOTE — H&P (View-Only) (Signed)
MRN : [id number]  [name] is a 87 y.o. (1928-09-11) male who presents with chief complaint of  Chief Complaint  Patient presents with   Follow-up    [facility] 1 week le angio follow up  .  History of Present Illness: Patient returns today in follow up of his peripheral arterial disease and ulceration of the right foot.  He has seen his podiatrist and the ulceration remains relatively clean and dry at this point.  He has poor perfusion of the right leg and no major surgery can be done on this as the heel.  It does hurt him although it is stable.  No major complaints or problems that are new since his last visit an angiogram earlier this month.  At angiography, he was found to have occlusion of the right common femoral artery, proximal profunda femoris artery, and the entire superficial femoral artery just below a previous aortobifemoral bypass.  Current Outpatient Medications  Medication Sig Dispense Refill   albuterol (PROVENTIL) (2.5 MG/3ML) 0.083% nebulizer solution Take 3 mLs (2.5 mg total) by nebulization every 6 (six) hours as needed for wheezing or shortness of breath. 75 mL 0   albuterol (VENTOLIN HFA) 108 (90 Base) MCG/ACT inhaler Inhale into the lungs every 6 (six) hours as needed for wheezing or shortness of breath.     alendronate (FOSAMAX) 70 MG tablet Take 70 mg by mouth once a week.     amoxicillin-clavulanate (AUGMENTIN) 875-125 MG tablet Take 1 tablet by mouth 2 (two) times daily.     apixaban (ELIQUIS) 2.5 MG TABS tablet Take 2.5 mg by mouth 2 (two) times daily.     atorvastatin (LIPITOR) 20 MG tablet Take 20 mg by mouth daily.     CARTIA XT 300 MG 24 hr capsule Take 300 mg by mouth daily.     diltiazem (TIAZAC) 300 MG 24 hr capsule Take by mouth.     fexofenadine (ALLEGRA) 180 MG tablet Take 180 mg by mouth daily.     Fluticasone-Umeclidin-Vilant (TRELEGY ELLIPTA) 100-62.5-25 MCG/INH AEPB Inhale 1 puff into the lungs daily.      furosemide (LASIX) 20 MG tablet TAKE 1  TABLET BY MOUTH DAILY 30 tablet 5   metoprolol tartrate (LOPRESSOR) 50 MG tablet Take 50 mg by mouth 2 (two) times daily.     mupirocin ointment (BACTROBAN) 2 % 1 application daily.     potassium chloride (KLOR-CON) 10 MEQ tablet Take 2 tablets (20 mEq total) by mouth daily. 60 tablet 5   predniSONE (DELTASONE) 10 MG tablet Take 5 mg by mouth daily with breakfast.     senna-docusate (SENOKOT-S) 8.6-50 MG tablet Take 1 tablet by mouth daily.     Vitamin D, Ergocalciferol, (DRISDOL) 1.25 MG (50000 UNIT) CAPS capsule Take 1 capsule (50,000 Units total) by mouth every 7 (seven) days. 5 capsule 0   metoprolol succinate (TOPROL-XL) 25 MG 24 hr tablet Take 1 tablet by mouth daily. (Patient not taking: No sig reported)     traZODone (DESYREL) 50 MG tablet Take 50 mg by mouth at bedtime as needed for sleep.  (Patient not taking: No sig reported)     No current facility-administered medications for this visit.    Past Medical History:  Diagnosis Date   CHF (congestive heart failure) (HCC)    COPD (chronic obstructive pulmonary disease) (HCC)    Coronary artery disease    Hypertension     Past Surgical History:  Procedure Laterality Date   LOWER EXTREMITY  ANGIOGRAPHY Right 07/05/2020   Procedure: LOWER EXTREMITY ANGIOGRAPHY;  Surgeon: Algernon Huxley, MD;  Location: [facility];  Service: Cardiovascular;  Laterality: Right;   Renal Bypass Surgery       Social History   Tobacco Use   Smoking status: Former    Pack years: 0.00   Smokeless tobacco: Never  Vaping Use   Vaping Use: Never used  Substance Use Topics   Alcohol use: Never   Drug use: Never     Family History No bleeding disorders, clotting disorders, autoimmune diseases, or aneurysms  No Known Allergies   REVIEW OF SYSTEMS (Negative unless checked)  Constitutional: [] Weight loss  [] Fever  [] Chills Cardiac: [] Chest pain   [] Chest pressure   [] Palpitations   [] Shortness of breath when laying flat   [] Shortness of  breath at rest   [] Shortness of breath with exertion. Vascular:  [] Pain in legs with walking   [] Pain in legs at rest   [] Pain in legs when laying flat   [] Claudication   [] Pain in feet when walking  [] Pain in feet at rest  [] Pain in feet when laying flat   [] History of DVT   [] Phlebitis   [] Swelling in legs   [] Varicose veins   [x] Non-healing ulcers Pulmonary:   [x] Uses home oxygen   [] Productive cough   [] Hemoptysis   [] Wheeze  [x] COPD   [] Asthma Neurologic:  [] Dizziness  [] Blackouts   [] Seizures   [] History of stroke   [] History of TIA  [] Aphasia   [] Temporary blindness   [] Dysphagia   [] Weakness or numbness in arms   [] Weakness or numbness in legs Musculoskeletal:  [x] Arthritis   [] Joint swelling   [x] Joint pain   [] Low back pain Hematologic:  [] Easy bruising  [] Easy bleeding   [] Hypercoagulable state   [] Anemic   Gastrointestinal:  [] Blood in stool   [] Vomiting blood  [] Gastroesophageal reflux/heartburn   [] Abdominal pain Genitourinary:  [] Chronic kidney disease   [] Difficult urination  [] Frequent urination  [] Burning with urination   [] Hematuria Skin:  [] Rashes   [x] Ulcers   [x] Wounds Psychological:  [] History of anxiety   []  History of major depression.  Physical Examination  BP (!) 158/90 (BP Location: Right Arm)   Pulse 76   Resp 16  Gen:  WD/WN, NAD Head: [place]/AT, No temporalis wasting. Ear/Nose/Throat: Hearing diminished, nares w/o erythema or drainage Eyes: Conjunctiva clear. Sclera non-icteric Neck: Supple.  Trachea midline Pulmonary:  Good air movement, no use of accessory muscles on supplemental oxygen.  Cardiac: Irregular Vascular Vessel Right Left  Radial Palpable Palpable                          PT Not palpable Not palpable  DP Not palpable Trace palpable    Musculoskeletal: M/S 5/5 throughout.  No deformity or atrophy.  Right toe/foot ulcerations currently dressed.  No significant lower extremity edema. Neurologic: Sensation grossly intact in extremities.   Symmetrical.  Speech is fluent.  Psychiatric: Judgment intact, Mood & affect appropriate for pt's clinical situation. Dermatologic: No rashes or ulcers noted.  No cellulitis or open wounds.  Access site from previous angiogram well-healed.      Labs Recent Results (from the past 2160 hour(s))  BUN     Status: None   Collection Time: 07/05/20  9:14 AM  Result Value Ref Range   BUN 23 8 - 23 mg/dL    Comment: Performed at [facility] [address], [place], [place] [address]  Creatinine, serum     Status: None   Collection Time: 07/05/20  9:14 AM  Result Value Ref Range   Creatinine, Ser 1.00 0.61 - 1.24 mg/dL   GFR, Estimated >60 >60 mL/min    Comment: (NOTE) Calculated using the CKD-EPI Creatinine Equation (2021) Performed at [facility], [address], [place], [place] [address]     Radiology PERIPHERAL VASCULAR CATHETERIZATION  Result Date: 07/05/2020 See surgical note for result.  VAS [place] ABI WITH/WO TBI  Result Date: 06/30/2020  LOWER EXTREMITY DOPPLER STUDY Patient Name:  [name]  Date of Exam:   06/30/2020 Medical Rec #: [id number]          Accession #:    [id number] Date of Birth: [birth year]          Patient Gender: M Patient Age:   34Y Exam Location:  [facility] Procedure:      VAS [place] ABI WITH/WO TBI Referring Phys: [id number] Bernadene Garside S Jacere Pangborn --------------------------------------------------------------------------------  Indications: Rt great toe wound x 4 mths.  Performing Technologist: Concha Norway RVT  Examination Guidelines: A complete evaluation includes at minimum, Doppler waveform signals and systolic blood pressure reading at the level of bilateral brachial, anterior tibial, and posterior tibial arteries, when vessel segments are accessible. Bilateral testing is considered an integral part of a complete examination. Photoelectric Plethysmograph (PPG) waveforms and toe systolic pressure readings are included as required and  additional duplex testing as needed. Limited examinations for reoccurring indications may be performed as noted.  ABI Findings: +---------+------------------+-----+----------+--------+ Right    Rt Pressure (mmHg)IndexWaveform  Comment  +---------+------------------+-----+----------+--------+ Brachial 156                                       +---------+------------------+-----+----------+--------+ ATA      89                0.57 monophasic         +---------+------------------+-----+----------+--------+ PTA      115               0.74 monophasic         +---------+------------------+-----+----------+--------+ Great Toe29                0.19 Abnormal           +---------+------------------+-----+----------+--------+ +---------+------------------+-----+----------+-------+ Left     Lt Pressure (mmHg)IndexWaveform  Comment +---------+------------------+-----+----------+-------+ ATA      164               1.05 monophasic        +---------+------------------+-----+----------+-------+ PTA      162               1.04 monophasic        +---------+------------------+-----+----------+-------+ Great Toe76                0.49 Abnormal          +---------+------------------+-----+----------+-------+ +-------+-----------+-----------+------------+------------+ ABI/TBIToday's ABIToday's TBIPrevious ABIPrevious TBI +-------+-----------+-----------+------------+------------+ Right  .57        .19                                 +-------+-----------+-----------+------------+------------+ Left   1.05       .49                                 +-------+-----------+-----------+------------+------------+  Summary: Right: Resting right ankle-brachial index indicates moderate right lower extremity arterial disease. The right toe-brachial index is abnormal. Left: Resting left ankle-brachial index is within normal range. No evidence of significant left lower extremity  arterial disease. The left toe-brachial index is abnormal.  *See table(s) above for measurements and observations.  Electronically signed by [name] MD on 06/30/2020 at 10:55:37 AM.    Final     Assessment/Plan  Atherosclerosis of native arteries of the extremities with ulceration ([place]) At angiography, he was found to have occlusion of the right common femoral artery, proximal profunda femoris artery, and the entire superficial femoral artery just below a previous aortobifemoral bypass. We had a long discussion with he and his daughter again today just as we did after his angiogram earlier this month.  His blood flow was an adequate for healing the wound on the right foot and this is clearly a limb threatening situation and without any intervention, at some point once significant infection or gangrenous changes develop he will lose the right leg.  Not only would he lose the right leg, with this flow as it is currently, this would be a very high above-knee amputation and will have difficulty healing due to the poor inflow.  In addition, it is very surprising his aortobifemoral bypass limb is still open as there is no outflow for the limb.  If this thrombosis and he loses the collaterals from the circumflex vessels, he will have an acute limb threatening ischemia that will be very difficult to get him to survive given his comorbidities.  This would likely require hip disarticulation.  I do not think he would tolerate that.  Full revascularization would be a femoral endarterectomy and a Pham distal bypass, but I do not think his physiology is suitable for a long surgery such as that.  I do think he will tolerate a femoral endarterectomy which should help keep the bypass limb open and significantly improve the perfusion to the right foot.  This will get a chance for wound healing and at least keep an amputation at a lower level if required.  He and his daughter have many questions and we did our best to answer  these today.  They voiced their understanding and he desires to proceed.  Hypertension blood pressure control important in reducing the progression of atherosclerotic disease. On appropriate oral medications.   COPD (chronic obstructive pulmonary disease) ([place]) Significant.  On oxygen.  Will limit our surgery and I would not do a long leg bypass as I do not think he would tolerate this.  HLD (hyperlipidemia) lipid control important in reducing the progression of atherosclerotic disease. Continue statin therapy    [name], MD  07/14/2020 10:13 AM    This note was created with Dragon medical transcription system.  Any errors from dictation are purely unintentional

## 2020-07-14 NOTE — Progress Notes (Signed)
MRN : [id number]  [name] is a 87 y.o. (08/21/1928) male who presents with chief complaint of  Chief Complaint  Patient presents with   Follow-up    [facility] 1 week le angio follow up  .  History of Present Illness: Patient returns today in follow up of his peripheral arterial disease and ulceration of the right foot.  He has seen his podiatrist and the ulceration remains relatively clean and dry at this point.  He has poor perfusion of the right leg and no major surgery can be done on this as the heel.  It does hurt him although it is stable.  No major complaints or problems that are new since his last visit an angiogram earlier this month.  At angiography, he was found to have occlusion of the right common femoral artery, proximal profunda femoris artery, and the entire superficial femoral artery just below a previous aortobifemoral bypass.  Current Outpatient Medications  Medication Sig Dispense Refill   albuterol (PROVENTIL) (2.5 MG/3ML) 0.083% nebulizer solution Take 3 mLs (2.5 mg total) by nebulization every 6 (six) hours as needed for wheezing or shortness of breath. 75 mL 0   albuterol (VENTOLIN HFA) 108 (90 Base) MCG/ACT inhaler Inhale into the lungs every 6 (six) hours as needed for wheezing or shortness of breath.     alendronate (FOSAMAX) 70 MG tablet Take 70 mg by mouth once a week.     amoxicillin-clavulanate (AUGMENTIN) 875-125 MG tablet Take 1 tablet by mouth 2 (two) times daily.     apixaban (ELIQUIS) 2.5 MG TABS tablet Take 2.5 mg by mouth 2 (two) times daily.     atorvastatin (LIPITOR) 20 MG tablet Take 20 mg by mouth daily.     CARTIA XT 300 MG 24 hr capsule Take 300 mg by mouth daily.     diltiazem (TIAZAC) 300 MG 24 hr capsule Take by mouth.     fexofenadine (ALLEGRA) 180 MG tablet Take 180 mg by mouth daily.     Fluticasone-Umeclidin-Vilant (TRELEGY ELLIPTA) 100-62.5-25 MCG/INH AEPB Inhale 1 puff into the lungs daily.      furosemide (LASIX) 20 MG tablet TAKE 1  TABLET BY MOUTH DAILY 30 tablet 5   metoprolol tartrate (LOPRESSOR) 50 MG tablet Take 50 mg by mouth 2 (two) times daily.     mupirocin ointment (BACTROBAN) 2 % 1 application daily.     potassium chloride (KLOR-CON) 10 MEQ tablet Take 2 tablets (20 mEq total) by mouth daily. 60 tablet 5   predniSONE (DELTASONE) 10 MG tablet Take 5 mg by mouth daily with breakfast.     senna-docusate (SENOKOT-S) 8.6-50 MG tablet Take 1 tablet by mouth daily.     Vitamin D, Ergocalciferol, (DRISDOL) 1.25 MG (50000 UNIT) CAPS capsule Take 1 capsule (50,000 Units total) by mouth every 7 (seven) days. 5 capsule 0   metoprolol succinate (TOPROL-XL) 25 MG 24 hr tablet Take 1 tablet by mouth daily. (Patient not taking: No sig reported)     traZODone (DESYREL) 50 MG tablet Take 50 mg by mouth at bedtime as needed for sleep.  (Patient not taking: No sig reported)     No current facility-administered medications for this visit.    Past Medical History:  Diagnosis Date   CHF (congestive heart failure) (HCC)    COPD (chronic obstructive pulmonary disease) (HCC)    Coronary artery disease    Hypertension     Past Surgical History:  Procedure Laterality Date   LOWER EXTREMITY  ANGIOGRAPHY Right 07/05/2020   Procedure: LOWER EXTREMITY ANGIOGRAPHY;  Surgeon: Algernon Huxley, MD;  Location: [facility];  Service: Cardiovascular;  Laterality: Right;   Renal Bypass Surgery       Social History   Tobacco Use   Smoking status: Former    Pack years: 0.00   Smokeless tobacco: Never  Vaping Use   Vaping Use: Never used  Substance Use Topics   Alcohol use: Never   Drug use: Never     Family History No bleeding disorders, clotting disorders, autoimmune diseases, or aneurysms  No Known Allergies   REVIEW OF SYSTEMS (Negative unless checked)  Constitutional: [] Weight loss  [] Fever  [] Chills Cardiac: [] Chest pain   [] Chest pressure   [] Palpitations   [] Shortness of breath when laying flat   [] Shortness of  breath at rest   [] Shortness of breath with exertion. Vascular:  [] Pain in legs with walking   [] Pain in legs at rest   [] Pain in legs when laying flat   [] Claudication   [] Pain in feet when walking  [] Pain in feet at rest  [] Pain in feet when laying flat   [] History of DVT   [] Phlebitis   [] Swelling in legs   [] Varicose veins   [x] Non-healing ulcers Pulmonary:   [x] Uses home oxygen   [] Productive cough   [] Hemoptysis   [] Wheeze  [x] COPD   [] Asthma Neurologic:  [] Dizziness  [] Blackouts   [] Seizures   [] History of stroke   [] History of TIA  [] Aphasia   [] Temporary blindness   [] Dysphagia   [] Weakness or numbness in arms   [] Weakness or numbness in legs Musculoskeletal:  [x] Arthritis   [] Joint swelling   [x] Joint pain   [] Low back pain Hematologic:  [] Easy bruising  [] Easy bleeding   [] Hypercoagulable state   [] Anemic   Gastrointestinal:  [] Blood in stool   [] Vomiting blood  [] Gastroesophageal reflux/heartburn   [] Abdominal pain Genitourinary:  [] Chronic kidney disease   [] Difficult urination  [] Frequent urination  [] Burning with urination   [] Hematuria Skin:  [] Rashes   [x] Ulcers   [x] Wounds Psychological:  [] History of anxiety   []  History of major depression.  Physical Examination  BP (!) 158/90 (BP Location: Right Arm)   Pulse 76   Resp 16  Gen:  WD/WN, NAD Head: [place]/AT, No temporalis wasting. Ear/Nose/Throat: Hearing diminished, nares w/o erythema or drainage Eyes: Conjunctiva clear. Sclera non-icteric Neck: Supple.  Trachea midline Pulmonary:  Good air movement, no use of accessory muscles on supplemental oxygen.  Cardiac: Irregular Vascular Vessel Right Left  Radial Palpable Palpable                          PT Not palpable Not palpable  DP Not palpable Trace palpable    Musculoskeletal: M/S 5/5 throughout.  No deformity or atrophy.  Right toe/foot ulcerations currently dressed.  No significant lower extremity edema. Neurologic: Sensation grossly intact in extremities.   Symmetrical.  Speech is fluent.  Psychiatric: Judgment intact, Mood & affect appropriate for pt's clinical situation. Dermatologic: No rashes or ulcers noted.  No cellulitis or open wounds.  Access site from previous angiogram well-healed.      Labs Recent Results (from the past 2160 hour(s))  BUN     Status: None   Collection Time: 07/05/20  9:14 AM  Result Value Ref Range   BUN 23 8 - 23 mg/dL    Comment: Performed at [facility], [address] [place], [place] [address]  Creatinine, serum     Status: None   Collection Time: 07/05/20  9:14 AM  Result Value Ref Range   Creatinine, Ser 1.00 0.61 - 1.24 mg/dL   GFR, Estimated >60 >60 mL/min    Comment: (NOTE) Calculated using the CKD-EPI Creatinine Equation (2021) Performed at [facility], [address], [place], [place] [address]     Radiology PERIPHERAL VASCULAR CATHETERIZATION  Result Date: 07/05/2020 See surgical note for result.  VAS [place] ABI WITH/WO TBI  Result Date: 06/30/2020  LOWER EXTREMITY DOPPLER STUDY Patient Name:  MARKE GOODWYN  Date of Exam:   06/30/2020 Medical Rec #: [id number]          Accession #:    [id number] Date of Birth: 1928/03/05          Patient Gender: M Patient Age:   59Y Exam Location:  [facility] Procedure:      VAS [place] ABI WITH/WO TBI Referring Phys: [id number] Ayslin Kundert S Kwamaine Cuppett --------------------------------------------------------------------------------  Indications: Rt great toe wound x 4 mths.  Performing Technologist: Concha Norway RVT  Examination Guidelines: A complete evaluation includes at minimum, Doppler waveform signals and systolic blood pressure reading at the level of bilateral brachial, anterior tibial, and posterior tibial arteries, when vessel segments are accessible. Bilateral testing is considered an integral part of a complete examination. Photoelectric Plethysmograph (PPG) waveforms and toe systolic pressure readings are included as required and  additional duplex testing as needed. Limited examinations for reoccurring indications may be performed as noted.  ABI Findings: +---------+------------------+-----+----------+--------+ Right    Rt Pressure (mmHg)IndexWaveform  Comment  +---------+------------------+-----+----------+--------+ Brachial 156                                       +---------+------------------+-----+----------+--------+ ATA      89                0.57 monophasic         +---------+------------------+-----+----------+--------+ PTA      115               0.74 monophasic         +---------+------------------+-----+----------+--------+ Great Toe29                0.19 Abnormal           +---------+------------------+-----+----------+--------+ +---------+------------------+-----+----------+-------+ Left     Lt Pressure (mmHg)IndexWaveform  Comment +---------+------------------+-----+----------+-------+ ATA      164               1.05 monophasic        +---------+------------------+-----+----------+-------+ PTA      162               1.04 monophasic        +---------+------------------+-----+----------+-------+ Great Toe76                0.49 Abnormal          +---------+------------------+-----+----------+-------+ +-------+-----------+-----------+------------+------------+ ABI/TBIToday's ABIToday's TBIPrevious ABIPrevious TBI +-------+-----------+-----------+------------+------------+ Right  .57        .19                                 +-------+-----------+-----------+------------+------------+ Left   1.05       .49                                 +-------+-----------+-----------+------------+------------+  Summary: Right: Resting right ankle-brachial index indicates moderate right lower extremity arterial disease. The right toe-brachial index is abnormal. Left: Resting left ankle-brachial index is within normal range. No evidence of significant left lower extremity  arterial disease. The left toe-brachial index is abnormal.  *See table(s) above for measurements and observations.  Electronically signed by [name] MD on 06/30/2020 at 10:55:37 AM.    Final     Assessment/Plan  Atherosclerosis of native arteries of the extremities with ulceration ([place]) At angiography, he was found to have occlusion of the right common femoral artery, proximal profunda femoris artery, and the entire superficial femoral artery just below a previous aortobifemoral bypass. We had a long discussion with he and his daughter again today just as we did after his angiogram earlier this month.  His blood flow was an adequate for healing the wound on the right foot and this is clearly a limb threatening situation and without any intervention, at some point once significant infection or gangrenous changes develop he will lose the right leg.  Not only would he lose the right leg, with this flow as it is currently, this would be a very high above-knee amputation and will have difficulty healing due to the poor inflow.  In addition, it is very surprising his aortobifemoral bypass limb is still open as there is no outflow for the limb.  If this thrombosis and he loses the collaterals from the circumflex vessels, he will have an acute limb threatening ischemia that will be very difficult to get him to survive given his comorbidities.  This would likely require hip disarticulation.  I do not think he would tolerate that.  Full revascularization would be a femoral endarterectomy and a Pham distal bypass, but I do not think his physiology is suitable for a long surgery such as that.  I do think he will tolerate a femoral endarterectomy which should help keep the bypass limb open and significantly improve the perfusion to the right foot.  This will get a chance for wound healing and at least keep an amputation at a lower level if required.  He and his daughter have many questions and we did our best to answer  these today.  They voiced their understanding and he desires to proceed.  Hypertension blood pressure control important in reducing the progression of atherosclerotic disease. On appropriate oral medications.   COPD (chronic obstructive pulmonary disease) ([place]) Significant.  On oxygen.  Will limit our surgery and I would not do a long leg bypass as I do not think he would tolerate this.  HLD (hyperlipidemia) lipid control important in reducing the progression of atherosclerotic disease. Continue statin therapy    [name], MD  07/14/2020 10:13 AM    This note was created with Dragon medical transcription system.  Any errors from dictation are purely unintentional

## 2020-07-19 ENCOUNTER — Telehealth: Payer: Self-pay | Admitting: Nurse Practitioner

## 2020-07-19 ENCOUNTER — Other Ambulatory Visit: Payer: Medicare Other | Admitting: Nurse Practitioner

## 2020-07-19 ENCOUNTER — Other Ambulatory Visit: Payer: Self-pay

## 2020-07-19 NOTE — Telephone Encounter (Signed)
I called [name], [name] daughter for telemedicine telephonic as video not available. [name] wished to reschedule visit, rescheduled per request. [name] talked about ongoing care with wound/with new osteomyelitis with ongoing treatment with potential upcoming surgery. We talked about option of Hospice which if [name] wish to take more comfort care path. We talked about with pending surgery, possible IV antibiotics for osteomyelitis, will wait until treatment completed unless decide to proceed with comfort care focus. [name] in agreement. We talked about f/u PC visit, and [name] in agreement to contact prior to appointment if chooses wishes to proceed with hospice, proceed with community paramedic program.   Total time 15 minutes Documentation 5 minutes Phone discussion 10 minutes

## 2020-07-25 ENCOUNTER — Telehealth (INDEPENDENT_AMBULATORY_CARE_PROVIDER_SITE_OTHER): Payer: Self-pay

## 2020-07-25 NOTE — Telephone Encounter (Signed)
Spoke with the patient's daughter and he is scheduled with Dr. Lucky Cowboy for a right femoral endarterectomy on 08/09/20 at the [facility]. Phone call pre-op on 08/02/20 between 1-5 pm and covid testing on 08/07/20 between 8-12 pm at the [facility]. Pre-surgical instructions were discussed and will be mailed.

## 2020-07-26 ENCOUNTER — Telehealth (HOSPITAL_COMMUNITY): Payer: Self-pay

## 2020-07-26 ENCOUNTER — Telehealth (INDEPENDENT_AMBULATORY_CARE_PROVIDER_SITE_OTHER): Payer: Self-pay

## 2020-07-26 NOTE — Telephone Encounter (Signed)
[name] daughter called stating that she feels the bottom of the patient's foot is gray, yet the patient has no other symptoms or pain. Patient is scheduled on 08/09/20 for a right femoral endarterectomy with Dr. Lucky Cowboy. Patient was seen by Dr. [name] in podiatry today as well.

## 2020-07-26 NOTE — Telephone Encounter (Signed)
[name] daughter was called and was given the recommendation from Eulogio Ditch NP. See notes below.

## 2020-07-26 NOTE — Telephone Encounter (Signed)
Kellyand I have spoke several times this past month about surgery and her Dads to and circulation problems he is having.  She advised both feet was puffy but he seen cardiology yesterday and he advised him to take double lasix for next 7 days and see if that helps.  Will do a home visit next week to check on him.  [name] states his weight has been going up a few lbs but over past month.  He denies any problems except normal stuff, still has a cough with mucous and back hurts.  He does not do much activity anymore and sleeps a lot in the day time.  Appetite is good and he likes his sweets.  Will visit for heart failure.   [facility] [phone]

## 2020-07-26 NOTE — Telephone Encounter (Signed)
The fact that the patient has no pain or other symptoms is reassuring.  The patient is scheduled for femoral endarterectomy so if there was decreased perfusion we already have a plan in place to rectify this.  We can move the patient up on the schedule if there is availability however I am unsure that there is.  In the interim however if you feel that the symptoms are acutely worsening and his foot is becoming painful or cold you can present to the emergency room for evaluation.

## 2020-08-01 ENCOUNTER — Other Ambulatory Visit (INDEPENDENT_AMBULATORY_CARE_PROVIDER_SITE_OTHER): Payer: Self-pay | Admitting: Nurse Practitioner

## 2020-08-02 ENCOUNTER — Other Ambulatory Visit
Admission: RE | Admit: 2020-08-02 | Discharge: 2020-08-02 | Disposition: A | Discharge status: Home / Self Care | Payer: Medicare Other | Source: Ambulatory Visit | Attending: Vascular Surgery | Admitting: Vascular Surgery

## 2020-08-02 ENCOUNTER — Other Ambulatory Visit: Payer: Self-pay

## 2020-08-02 NOTE — Patient Instructions (Signed)
Your procedure is scheduled on: Wednesday August 09, 2020. Report to Day Surgery inside [facility] 2nd floor (stop by admissions desk first before getting on elevator). To find out your arrival time please call [phone] between 1PM - 3PM on .  Remember: Instructions that are not followed completely may result in serious medical risk,  up to and including death, or upon the discretion of your surgeon and anesthesiologist your  surgery may need to be rescheduled.     _X__ 1. Do not eat food or drink fluids after midnight the night before your procedure.                 No chewing gum or hard candies. You may drink clear liquids up to 2 hours                 __X__2.  On the morning of surgery brush your teeth with toothpaste and water, you                may rinse your mouth with mouthwash if you wish.  Do not swallow any toothpaste of mouthwash.     _X__ 3.  No Alcohol for 24 hours before or after surgery.   _X__ 4.  Do Not Smoke or use e-cigarettes For 24 Hours Prior to Your Surgery.                 Do not use any chewable tobacco products for at least 6 hours prior to                 Surgery.  _X__  5.  Do not use any recreational drugs (marijuana, cocaine, heroin, ecstasy, MDMA or other)                For at least one week prior to your surgery.  Combination of these drugs with anesthesia                May have life threatening results.  __X__ 6.  Notify your doctor if there is any change in your medical condition      (cold, fever, infections).     Do not wear jewelry, make-up, hairpins, clips or nail polish. Do not wear lotions, powders, or perfumes. You may wear deodorant. Do not shave 48 hours prior to surgery. Men may shave face and neck. Do not bring valuables to the hospital.    [facility] is not responsible for any belongings or valuables.  Contacts, dentures or bridgework may not be worn into surgery. Leave your suitcase in the car. After  surgery it may be brought to your room. For patients admitted to the hospital, discharge time is determined by your treatment team.   Patients discharged the day of surgery will not be allowed to drive home.   Make arrangements for someone to be with you for the first 24 hours of your Same Day Discharge.   __X__ Take these medicines the morning of surgery with A SIP OF WATER:    1. amoxicillin-clavulanate (AUGMENTIN) 875-125 MG tablet  2. atorvastatin (LIPITOR) 20 MG  3. diltiazem (TIAZAC) 300 MG  4. metoprolol tartrate (LOPRESSOR) 50 MG  5.  6.  ____ Fleet Enema (as directed)   __X__ Use CHG Soap  as directed  ____ Use Benzoyl Peroxide Gel as instructed  __X__ Use inhalers on the day of surgery  Fluticasone-Umeclidin-Vilant (TRELEGY ELLIPTA) 100-62.5-25 MCG/INH AEPB  albuterol (PROVENTIL) (2.5 MG/3ML) 0.083% nebulizer solution ____ Stop metformin  2 days prior to surgery    ____ Take 1/2 of usual insulin dose the night before surgery. No insulin the morning          of surgery.   __X__ Stop  and ask when to stop before your surgery.   __X__ One Week prior to surgery- Stop Anti-inflammatories such as Ibuprofen, Aleve, Advil, Motrin, meloxicam (MOBIC), diclofenac, etodolac, ketorolac, Toradol, Daypro, piroxicam, Goody's or BC powders. OK TO USE TYLENOL IF NEEDED   __X__ Stop supplements until after surgery.    __X__ Bring your Oxygen.   If you have any questions regarding your pre-procedure instructions,  Please call Pre-admit Testing at [phone]

## 2020-08-02 NOTE — Pre-Procedure Instructions (Signed)
[name] daughter [name] stated that patient has ulcer on tip of big toe that is Very very tender, while he is in the hospital does not need to get wet or washed. Needs new bandage and ointment applied daily.

## 2020-08-04 ENCOUNTER — Encounter: Payer: Self-pay | Admitting: Vascular Surgery

## 2020-08-04 NOTE — Progress Notes (Signed)
Perioperative Services  Pre-Admission/Anesthesia Testing Clinical Review  Date: 08/07/20  Patient Demographics:  Name: [name] DOB:   March 24, 1928 MRN:   [id number]  Planned Surgical Procedure(s):    Case: [id number] Date/Time: 08/09/20 0715   Procedures:      ENDARTERECTOMY FEMORAL (Right)     APPLICATION OF CELL SAVER   Anesthesia type: General   Pre-op diagnosis: ASO WITH ULCERATION   Location: [facility] OR ROOM 09 / [place] [facility]   Surgeons: Algernon Huxley, MD     NOTE: Available PAT nursing documentation and vital signs have been reviewed. Clinical nursing staff has updated patient's PMH/PSHx, current medication list, and drug allergies/intolerances to ensure comprehensive history available to assist in medical decision making as it pertains to the aforementioned surgical procedure and anticipated anesthetic course. Extensive review of available clinical information performed. [place] PMH and PSHx updated with any diagnoses/procedures that  may have been inadvertently omitted during his intake with the pre-admission testing department's nursing staff.  Clinical Discussion:  [name] is a 87 y.o. male who is submitted for pre-surgical anesthesia review and clearance prior to him undergoing the above procedure. Patient is a Former Smoker (30 pack years; quit 01/1995). Pertinent PMH includes: CAD, paroxysmal atrial fibrillation, HFpEF, aortic ectasia, murmur, aortic atherosclerosis, HTN, HLD, PVD, COPD (requires supplemental oxygen ATC).   Patient is followed by cardiology Ubaldo Glassing, MD). He was last seen in the cardiology clinic on 07/25/2020; notes reviewed.  At the time of his clinic visit, patient denied any episodes of chest pain, however he did complain of chronic shortness of breath related to his underlying COPD diagnosis.  Patient on supplemental oxygen ATC.  He denied PND, orthopnea, palpitations, vertiginous symptoms, and presyncope/syncope.  PMH  significant for cardiovascular diagnoses. Patient with intermittent peripheral edema related to his HFpEF and PVD diagnoses.   Last TTE was performed on 10/10/2019 revealing normal left ventricular systolic function with an EF of 55-60%.  Doppler parameters consistent with grade 1 diastolic dysfunction.  There were no regional wall motion abnormalities.  Right ventricular systolic function moderately reduced; right ventricle moderately enlarged.  Vascular ABI/TBI studies performed on 06/30/2020 revealed moderate lower extremity arterial disease on the RIGHT (see full interpretation of cardiovascular testing below).  CHA2DS2-VASc Score = 5 (age x2, CHF, HTN, vascular disease). Patient chronically anticoagulated using apixaban; compliant with therapy with no evidence of GI bleeding.  Rate and rhythm controlled on oral diltiazem and metoprolol. Blood pressure well controlled at 110/64 on currently prescribed regimen.  Patient is on a statin for his HLD. Functional capacity limited by cardiopulmonary disease, vascular disease, and age-related debility; able to perform </= 4 METS of activity.  No changes were made to patient's medication regimen.  Patient to follow-up with outpatient cardiology in 3 months or sooner if needed.  Patient is scheduled to undergo a RIGHT femoral endarterectomy on 08/09/2020 with Dr. [name], MD.  Given patient's past medical history significant for cardiopulmonary diagnoses, presurgical clearance was sought from cardiology. Per cardiology, "patient appears to be optimized from a cardiovascular standpoint.  Patient may proceed with planned surgical course at an overall ACCEPTABLE risk of significant perioperative cardiovascular complication". Again, this patient is on daily anticoagulation therapy. He has been instructed on recommendations for holding his apixaban for 3 days prior to his procedure with plans to restart as soon as postoperative bleeding risk felt to be minimized by  his attending surgeon. The patient has been instructed that his last dose of  his anticoagulant will be on 08/05/2020.  Patient denies previous perioperative complications with anesthesia in the past. In review of the EMR, there are no records available for review regarding patient's past surgical/anesthetic courses within the [facility] system.  Vitals with BMI 08/02/2020 07/14/2020 07/05/2020  Height 5\' 8"  - -  Weight 140 lbs 3 oz - -  BMI 94.70 - -  Systolic - 962 -  Diastolic - 90 -  Pulse - 76 71    Providers/Specialists:   NOTE: Primary physician provider listed below. Patient may have been seen by APP or partner within same practice.   PROVIDER ROLE / SPECIALTY LAST Imelda Pillow, MD  Vascular Surgery 07/14/2020  Baxter Hire, MD  Primary Care Provider 06/14/2020  Bartholome Bill, MD  Cardiology 07/25/2020  Wallene Huh, MD  Pulmonary Medicine 04/21/2020   Allergies:  Patient has no known allergies.  Current Home Medications:   No current facility-administered medications for this encounter.    Acetaminophen 500 MG capsule   albuterol (PROVENTIL) (2.5 MG/3ML) 0.083% nebulizer solution   albuterol (VENTOLIN HFA) 108 (90 Base) MCG/ACT inhaler   alendronate (FOSAMAX) 70 MG tablet   amoxicillin-clavulanate (AUGMENTIN) 875-125 MG tablet   apixaban (ELIQUIS) 2.5 MG TABS tablet   atorvastatin (LIPITOR) 20 MG tablet   Calcium Carb-Cholecalciferol (CALCIUM 600/VITAMIN D PO)   CARTIA XT 300 MG 24 hr capsule   diltiazem (TIAZAC) 300 MG 24 hr capsule   fexofenadine (ALLEGRA) 180 MG tablet   Fluticasone-Umeclidin-Vilant (TRELEGY ELLIPTA) 100-62.5-25 MCG/INH AEPB   Fluticasone-Umeclidin-Vilant (TRELEGY ELLIPTA) 100-62.5-25 MCG/INH AEPB   furosemide (LASIX) 20 MG tablet   metoprolol succinate (TOPROL-XL) 25 MG 24 hr tablet   metoprolol tartrate (LOPRESSOR) 50 MG tablet   mupirocin ointment (BACTROBAN) 2 %   potassium chloride (KLOR-CON) 10 MEQ tablet   predniSONE  (DELTASONE) 10 MG tablet   senna-docusate (SENOKOT-S) 8.6-50 MG tablet   traZODone (DESYREL) 50 MG tablet   Vitamin D, Ergocalciferol, (DRISDOL) 1.25 MG (50000 UNIT) CAPS capsule   Wheat Dextrin (BENEFIBER PO)   History:   Past Medical History:  Diagnosis Date   (HFpEF) heart failure with preserved ejection fraction (HCC)    Aortic atherosclerosis (HCC)    Cardiac murmur    Grade I/VI medium pitched mid systolic blowing at lower LSB   COPD (chronic obstructive pulmonary disease) (HCC)    Coronary artery disease    Dependence on supplemental oxygen    HLD (hyperlipidemia)    Hypertension    Paroxysmal atrial fibrillation (HCC)    PVD (peripheral vascular disease) (HCC)    Skin cancer    nose and ear   Thoracic aortic ectasia (HCC)    a.) measured 3.6 x 3.3 cm on 10/08/2019   Past Surgical History:  Procedure Laterality Date   COLONOSCOPY  2007   EYE SURGERY Bilateral 2005   LOWER EXTREMITY ANGIOGRAPHY Right 07/05/2020   Procedure: LOWER EXTREMITY ANGIOGRAPHY;  Surgeon: Algernon Huxley, MD;  Location: [place] [facility];  Service: Cardiovascular;  Laterality: Right;   Renal Bypass Surgery Bilateral 2000   No family history on file. Social History   Tobacco Use   Smoking status: Former    Packs/day: 1.00    Years: 30.00    Pack years: 30.00    Types: Cigarettes    Quit date: 1997    Years since quitting: 25.5   Smokeless tobacco: Never  Vaping Use   Vaping Use: Never used  Substance Use Topics  Alcohol use: Never   Drug use: Never    Pertinent Clinical Results:  LABS: Labs reviewed: Acceptable for surgery.  Hospital Outpatient Visit on 08/07/2020  Component Date Value Ref Range Status   WBC 08/07/2020 10.4  4.0 - 10.5 K/uL Final   RBC 08/07/2020 3.53 (A) 4.22 - 5.81 MIL/uL Final   Hemoglobin 08/07/2020 10.3 (A) 13.0 - 17.0 g/dL Final   HCT 08/07/2020 31.9 (A) 39.0 - 52.0 % Final   MCV 08/07/2020 90.4  80.0 - 100.0 fL Final   MCH 08/07/2020 29.2  26.0 - 34.0  pg Final   MCHC 08/07/2020 32.3  30.0 - 36.0 g/dL Final   RDW 08/07/2020 15.3  11.5 - 15.5 % Final   Platelets 08/07/2020 275  150 - 400 K/uL Final   nRBC 08/07/2020 0.0  0.0 - 0.2 % Final   Neutrophils Relative % 08/07/2020 63  % Final   Neutro Abs 08/07/2020 6.5  1.7 - 7.7 K/uL Final   Lymphocytes Relative 08/07/2020 17  % Final   Lymphs Abs 08/07/2020 1.8  0.7 - 4.0 K/uL Final   Monocytes Relative 08/07/2020 14  % Final   Monocytes Absolute 08/07/2020 1.5 (A) 0.1 - 1.0 K/uL Final   Eosinophils Relative 08/07/2020 5  % Final   Eosinophils Absolute 08/07/2020 0.6 (A) 0.0 - 0.5 K/uL Final   Basophils Relative 08/07/2020 0  % Final   Basophils Absolute 08/07/2020 0.0  0.0 - 0.1 K/uL Final   Immature Granulocytes 08/07/2020 1  % Final   Abs Immature Granulocytes 08/07/2020 0.05  0.00 - 0.07 K/uL Final   Performed at [facility], [place], [place] [address]   Sodium 08/07/2020 133 (A) 135 - 145 mmol/L Final   Potassium 08/07/2020 3.7  3.5 - 5.1 mmol/L Final   Chloride 08/07/2020 96 (A) 98 - 111 mmol/L Final   CO2 08/07/2020 30  22 - 32 mmol/L Final   Glucose, Bld 08/07/2020 82  70 - 99 mg/dL Final   Glucose reference range applies only to samples taken after fasting for at least 8 hours.   BUN 08/07/2020 31 (A) 8 - 23 mg/dL Final   Creatinine, Ser 08/07/2020 1.04  0.61 - 1.24 mg/dL Final   Calcium 08/07/2020 9.0  8.9 - 10.3 mg/dL Final   GFR, Estimated 08/07/2020 >60  >60 mL/min Final   Comment: (NOTE) Calculated using the CKD-EPI Creatinine Equation (2021)    Anion gap 08/07/2020 7  5 - 15 Final   Performed at [facility], [address] [place], [place] [address]   ABO/RH(D) 08/07/2020 O POS   Final   Antibody Screen 08/07/2020 NEG   Final   Sample Expiration 08/07/2020 08/21/2020,2359   Final   Extend sample reason 08/07/2020    Final                   Value:NO TRANSFUSIONS OR PREGNANCY IN THE PAST 3 MONTHS Performed at [facility], [address] [place], [place] [address]     ECG: Date: 08/07/2020 Time ECG obtained: 0856 AM Rate: 84 bpm Rhythm:  Atrial fibrillation; RBBB Axis (leads I and aVF): Normal Intervals: QRS 140 ms. QTc 475 ms. ST segment and T wave changes: No evidence of acute ST segment elevation or depression Comparison: Similar to previous tracing obtained on 10/08/2019 [facility] here  IMAGING / PROCEDURES: Vascular ULTRASOUND ABI WITH/WITHOUT TBI performed at 06/30/2020 Right:  Resting right ankle-brachial index indicates moderate right lower extremity arterial  disease. T he right toe-brachial index is abnormal.  Left:  Resting left ankle-brachial index is within normal range.  No evidence of significant left lower extremity arterial disease.  The left toe-brachial index is abnormal.   TRANSTHORACIC ECHOCARDIOGRAM performed in 10/10/2019 Left ventricular ejection fraction, by estimation, is 55 to 60%. The left ventricle has normal function. The left ventricle has no regional wall motion abnormalities. Left ventricular diastolic parameters are consistent with Grade I diastolic dysfunction (impaired relaxation). There is the interventricular septum is  flattened in diastole ('D' shaped left ventricle), consistent with right ventricular volume overload.  Right ventricular systolic function is moderately reduced. The right ventricular size is moderately enlarged. There is moderately elevated pulmonary artery systolic pressure.  Left atrial size was mildly dilated.  Right atrial size was severely dilated.  The mitral valve is abnormal. Mild mitral valve regurgitation.  Tricuspid valve regurgitation is moderate to severe. The aortic valve is calcified. Aortic valve regurgitation is mild.  Mild to moderate aortic valve sclerosis/calcification is present, without any evidence of aortic stenosis.   CT CHEST WITHOUT CONTRAST performed on 10/08/2019 There is redemonstrated partially calcified biapical  pleuroparenchymal scarring, which is not significantly changed compared to prior examination dated 2017 and accounts for radiographic abnormality. No evidence of mass. Bronchial plugging in the dependent left lower lobe. Minimaldependent bibasilar atelectasis and/or consolidation and trace pleural effusions. Findings suggest aspiration. There is a buckled fracture deformity of the mid sternal body with overlying soft tissue edema, new compared to prior examination. Correlate for recent injury an acute point tenderness. There are multiple vertebral body wedge and endplate deformities, several which are new compared to prior examination dated 2017, particularly a high-grade wedge deformity of the T7 vertebral body, a more subtle superior endplate deformity of the T5 vertebral body, and an increased, now high-grade wedge deformity of the L2 vertebral body. These are of uncertain acuity. Correlate for acute point tenderness. MRI may be used to assess for marrow edema and fracture acuity if desired. Moderate to severe emphysema.  Coronary artery disease. Aortic atherosclerosis  Aortic valve calcifications.  Enlargement of the proximal descending thoracic aorta, measuring up to 3.6 x 3.3 cm, slightly enlarged compared to prior examination at which time it measured 3.2 x 3.2 cm. Recommend annual imaging followup by CTA or MRA if clinically appropriate.  PULMONARY FUNCTION TESTING performed on 04/07/2019 Fev1 0.83 liters Fvc 2.32 liters Ratio 36 Expiratory flow volume loop is delayed. Impression: severe-very severe COPD  Impression and Plan:  BAYAN KUSHNIR has been referred for pre-anesthesia review and clearance prior to him undergoing the planned anesthetic and procedural courses. Available labs, pertinent testing, and imaging results were personally reviewed by me. This patient has been appropriately cleared by cardiology with an overall ACCEPTABLE risk of significant perioperative cardiovascular  complications.  Based on clinical review performed today (08/07/20), barring any significant acute changes in the patient's overall condition, it is anticipated that he will be able to proceed with the planned surgical intervention. Any acute changes in clinical condition may necessitate his procedure being postponed and/or cancelled. Patient will meet with anesthesia team (MD and/or CRNA) on the day of his procedure for preoperative evaluation/assessment. Questions regarding anesthetic course will be fielded at that time.   Pre-surgical instructions were reviewed with the patient during his PAT appointment and questions were fielded by PAT clinical staff. Patient was advised that if any questions or concerns arise prior to his procedure then he should return a call to PAT and/or  his surgeon's office to discuss.  Honor Loh, MSN, APRN, FNP-C, CEN [facility]  Peri-operative Services Nurse Practitioner Phone: [phone] Fax: [phone] 08/07/20 1:13 PM  NOTE: This note has been prepared using Dragon dictation software. Despite my best ability to proofread, there is always the potential that unintentional transcriptional errors may still occur from this process.

## 2020-08-07 ENCOUNTER — Encounter
Admission: RE | Admit: 2020-08-07 | Discharge: 2020-08-07 | Disposition: A | Discharge status: Home / Self Care | Payer: Medicare Other | Source: Ambulatory Visit | Attending: Vascular Surgery | Admitting: Vascular Surgery

## 2020-08-07 ENCOUNTER — Other Ambulatory Visit: Payer: Medicare Other

## 2020-08-07 ENCOUNTER — Other Ambulatory Visit: Payer: Self-pay

## 2020-08-07 DIAGNOSIS — Z20822 Contact with and (suspected) exposure to covid-19: Secondary | ICD-10-CM | POA: Insufficient documentation

## 2020-08-07 DIAGNOSIS — Z01818 Encounter for other preprocedural examination: Secondary | ICD-10-CM | POA: Insufficient documentation

## 2020-08-07 LAB — CBC WITH DIFFERENTIAL/PLATELET
Abs Immature Granulocytes: 0.05 10*3/uL (ref 0.00–0.07)
Basophils Absolute: 0 10*3/uL (ref 0.0–0.1)
Basophils Relative: 0 %
Eosinophils Absolute: 0.6 10*3/uL — ABNORMAL HIGH (ref 0.0–0.5)
Eosinophils Relative: 5 %
HCT: 31.9 % — ABNORMAL LOW (ref 39.0–52.0)
Hemoglobin: 10.3 g/dL — ABNORMAL LOW (ref 13.0–17.0)
Immature Granulocytes: 1 %
Lymphocytes Relative: 17 %
Lymphs Abs: 1.8 10*3/uL (ref 0.7–4.0)
MCH: 29.2 pg (ref 26.0–34.0)
MCHC: 32.3 g/dL (ref 30.0–36.0)
MCV: 90.4 fL (ref 80.0–100.0)
Monocytes Absolute: 1.5 10*3/uL — ABNORMAL HIGH (ref 0.1–1.0)
Monocytes Relative: 14 %
Neutro Abs: 6.5 10*3/uL (ref 1.7–7.7)
Neutrophils Relative %: 63 %
Platelets: 275 10*3/uL (ref 150–400)
RBC: 3.53 MIL/uL — ABNORMAL LOW (ref 4.22–5.81)
RDW: 15.3 % (ref 11.5–15.5)
WBC: 10.4 10*3/uL (ref 4.0–10.5)
nRBC: 0 % (ref 0.0–0.2)

## 2020-08-07 LAB — BASIC METABOLIC PANEL
Anion gap: 7 (ref 5–15)
BUN: 31 mg/dL — ABNORMAL HIGH (ref 8–23)
CO2: 30 mmol/L (ref 22–32)
Calcium: 9 mg/dL (ref 8.9–10.3)
Chloride: 96 mmol/L — ABNORMAL LOW (ref 98–111)
Creatinine, Ser: 1.04 mg/dL (ref 0.61–1.24)
GFR, Estimated: >60 mL/min (ref >60)
Glucose, Bld: 82 mg/dL (ref 70–99)
Potassium: 3.7 mmol/L (ref 3.5–5.1)
Sodium: 133 mmol/L — ABNORMAL LOW (ref 135–145)

## 2020-08-07 LAB — TYPE AND SCREEN
ABO/RH(D): O POS
Antibody Screen: NEGATIVE

## 2020-08-08 LAB — SARS CORONAVIRUS 2 (TAT 6-24 HRS): SARS Coronavirus 2: NEGATIVE

## 2020-08-09 ENCOUNTER — Other Ambulatory Visit: Payer: Self-pay

## 2020-08-09 ENCOUNTER — Inpatient Hospital Stay: Payer: Medicare Other | Admitting: Urgent Care

## 2020-08-09 ENCOUNTER — Inpatient Hospital Stay
Admission: RE | Admit: 2020-08-09 | Discharge: 2020-08-11 | DRG: 253 | Disposition: A | Discharge status: Home / Self Care | Payer: Medicare Other | Attending: Vascular Surgery | Admitting: Vascular Surgery

## 2020-08-09 ENCOUNTER — Encounter: Payer: Self-pay | Admitting: Vascular Surgery

## 2020-08-09 ENCOUNTER — Encounter
Admission: RE | Disposition: A | Discharge status: Home / Self Care | Payer: Self-pay | Source: Home / Self Care | Attending: Vascular Surgery

## 2020-08-09 DIAGNOSIS — J449 Chronic obstructive pulmonary disease, unspecified: Secondary | ICD-10-CM | POA: Diagnosis present

## 2020-08-09 DIAGNOSIS — L97819 Non-pressure chronic ulcer of other part of right lower leg with unspecified severity: Secondary | ICD-10-CM | POA: Diagnosis present

## 2020-08-09 DIAGNOSIS — Y832 Surgical operation with anastomosis, bypass or graft as the cause of abnormal reaction of the patient, or of later complication, without mention of misadventure at the time of the procedure: Secondary | ICD-10-CM

## 2020-08-09 DIAGNOSIS — Z95820 Peripheral vascular angioplasty status with implants and grafts: Secondary | ICD-10-CM | POA: Diagnosis not present

## 2020-08-09 DIAGNOSIS — Z7901 Long term (current) use of anticoagulants: Secondary | ICD-10-CM | POA: Diagnosis not present

## 2020-08-09 DIAGNOSIS — Z87891 Personal history of nicotine dependence: Secondary | ICD-10-CM | POA: Diagnosis not present

## 2020-08-09 DIAGNOSIS — Z9981 Dependence on supplemental oxygen: Secondary | ICD-10-CM

## 2020-08-09 DIAGNOSIS — I70299 Other atherosclerosis of native arteries of extremities, unspecified extremity: Secondary | ICD-10-CM | POA: Diagnosis present

## 2020-08-09 DIAGNOSIS — I70261 Atherosclerosis of native arteries of extremities with gangrene, right leg: Secondary | ICD-10-CM | POA: Diagnosis present

## 2020-08-09 DIAGNOSIS — Z85828 Personal history of other malignant neoplasm of skin: Secondary | ICD-10-CM | POA: Diagnosis not present

## 2020-08-09 DIAGNOSIS — L97909 Non-pressure chronic ulcer of unspecified part of unspecified lower leg with unspecified severity: Secondary | ICD-10-CM | POA: Diagnosis present

## 2020-08-09 DIAGNOSIS — Z20822 Contact with and (suspected) exposure to covid-19: Secondary | ICD-10-CM | POA: Diagnosis present

## 2020-08-09 DIAGNOSIS — I110 Hypertensive heart disease with heart failure: Secondary | ICD-10-CM | POA: Diagnosis present

## 2020-08-09 DIAGNOSIS — I5032 Chronic diastolic (congestive) heart failure: Secondary | ICD-10-CM | POA: Diagnosis present

## 2020-08-09 DIAGNOSIS — J9811 Atelectasis: Secondary | ICD-10-CM | POA: Diagnosis not present

## 2020-08-09 DIAGNOSIS — E43 Unspecified severe protein-calorie malnutrition: Secondary | ICD-10-CM | POA: Insufficient documentation

## 2020-08-09 DIAGNOSIS — G4700 Insomnia, unspecified: Secondary | ICD-10-CM | POA: Diagnosis present

## 2020-08-09 DIAGNOSIS — T82898A Other specified complication of vascular prosthetic devices, implants and grafts, initial encounter: Secondary | ICD-10-CM

## 2020-08-09 DIAGNOSIS — I77810 Thoracic aortic ectasia: Secondary | ICD-10-CM | POA: Diagnosis present

## 2020-08-09 DIAGNOSIS — I70221 Atherosclerosis of native arteries of extremities with rest pain, right leg: Secondary | ICD-10-CM

## 2020-08-09 DIAGNOSIS — I2510 Atherosclerotic heart disease of native coronary artery without angina pectoris: Secondary | ICD-10-CM | POA: Diagnosis present

## 2020-08-09 DIAGNOSIS — I700 Atherosclerosis of aorta: Secondary | ICD-10-CM | POA: Diagnosis present

## 2020-08-09 DIAGNOSIS — Z79899 Other long term (current) drug therapy: Secondary | ICD-10-CM | POA: Diagnosis not present

## 2020-08-09 DIAGNOSIS — I480 Paroxysmal atrial fibrillation: Secondary | ICD-10-CM | POA: Diagnosis present

## 2020-08-09 DIAGNOSIS — E785 Hyperlipidemia, unspecified: Secondary | ICD-10-CM | POA: Diagnosis present

## 2020-08-09 DIAGNOSIS — Z7983 Long term (current) use of bisphosphonates: Secondary | ICD-10-CM

## 2020-08-09 HISTORY — DX: Cardiac murmur, unspecified: R01.1

## 2020-08-09 HISTORY — DX: Unspecified diastolic (congestive) heart failure: I50.30

## 2020-08-09 HISTORY — DX: Unspecified malignant neoplasm of skin, unspecified: C44.90

## 2020-08-09 HISTORY — PX: ENDARTERECTOMY FEMORAL: SHX5804

## 2020-08-09 HISTORY — DX: Thoracic aortic ectasia: I77.810

## 2020-08-09 HISTORY — DX: Peripheral vascular disease, unspecified: I73.9

## 2020-08-09 HISTORY — DX: Hyperlipidemia, unspecified: E78.5

## 2020-08-09 HISTORY — DX: Atherosclerosis of aorta: I70.0

## 2020-08-09 HISTORY — DX: Paroxysmal atrial fibrillation: I48.0

## 2020-08-09 HISTORY — DX: Dependence on supplemental oxygen: Z99.81

## 2020-08-09 LAB — CBC
HCT: 29.5 % — ABNORMAL LOW (ref 39.0–52.0)
Hemoglobin: 9.6 g/dL — ABNORMAL LOW (ref 13.0–17.0)
MCH: 30 pg (ref 26.0–34.0)
MCHC: 32.5 g/dL (ref 30.0–36.0)
MCV: 92.2 fL (ref 80.0–100.0)
Platelets: 237 10*3/uL (ref 150–400)
RBC: 3.2 MIL/uL — ABNORMAL LOW (ref 4.22–5.81)
RDW: 14.9 % (ref 11.5–15.5)
WBC: 6.9 10*3/uL (ref 4.0–10.5)
nRBC: 0 % (ref 0.0–0.2)

## 2020-08-09 LAB — CREATININE, SERUM
Creatinine, Ser: 0.98 mg/dL (ref 0.61–1.24)
GFR, Estimated: >60 mL/min (ref >60)

## 2020-08-09 LAB — MRSA NEXT GEN BY PCR, NASAL: MRSA by PCR Next Gen: NOT DETECTED

## 2020-08-09 LAB — GLUCOSE, CAPILLARY: Glucose-Capillary: 104 mg/dL — ABNORMAL HIGH (ref 70–99)

## 2020-08-09 SURGERY — ENDARTERECTOMY, FEMORAL
Anesthesia: General | Site: Groin | Laterality: Right

## 2020-08-09 MED ORDER — LABETALOL HCL 5 MG/ML IV SOLN: 10.0000 mg | INTRAVENOUS | Status: DC | PRN

## 2020-08-09 MED ORDER — FUROSEMIDE 20 MG PO TABS
20.0000 mg | ORAL_TABLET | Freq: Every day | ORAL | Status: DC
  Administered 2020-08-10 – 2020-08-11 (×2): 20 mg via ORAL
  Filled 2020-08-09 (×2): qty 1

## 2020-08-09 MED ORDER — ONDANSETRON HCL 4 MG/2ML IJ SOLN: 4.0000 mg | Freq: Four times a day (QID) | INTRAMUSCULAR | Status: DC | PRN

## 2020-08-09 MED ORDER — DILTIAZEM HCL ER COATED BEADS 300 MG PO CP24
300.0000 mg | ORAL_CAPSULE | Freq: Every day | ORAL | Status: DC
  Administered 2020-08-10 – 2020-08-11 (×2): 300 mg via ORAL
  Filled 2020-08-09 (×2): qty 1

## 2020-08-09 MED ORDER — FAMOTIDINE IN NACL 20-0.9 MG/50ML-% IV SOLN
20.0000 mg | Freq: Two times a day (BID) | INTRAVENOUS | Status: DC
  Administered 2020-08-09 (×2): 20 mg via INTRAVENOUS
  Filled 2020-08-09 (×2): qty 50

## 2020-08-09 MED ORDER — LORATADINE 10 MG PO TABS
10.0000 mg | ORAL_TABLET | Freq: Every day | ORAL | Status: DC
  Administered 2020-08-10 – 2020-08-11 (×2): 10 mg via ORAL
  Filled 2020-08-09 (×2): qty 1

## 2020-08-09 MED ORDER — MUPIROCIN 2 % EX OINT
1.0000 "application " | TOPICAL_OINTMENT | Freq: Every day | CUTANEOUS | Status: DC
  Administered 2020-08-09 – 2020-08-11 (×3): 1 via TOPICAL
  Filled 2020-08-09: qty 22

## 2020-08-09 MED ORDER — POTASSIUM CHLORIDE CRYS ER 20 MEQ PO TBCR: 20.0000 meq | EXTENDED_RELEASE_TABLET | Freq: Every day | ORAL | Status: DC | PRN

## 2020-08-09 MED ORDER — MORPHINE SULFATE (PF) 2 MG/ML IV SOLN: 2.0000 mg | INTRAVENOUS | Status: DC | PRN

## 2020-08-09 MED ORDER — FENTANYL CITRATE (PF) 100 MCG/2ML IJ SOLN
INTRAMUSCULAR | Status: AC
  Filled 2020-08-09: qty 2

## 2020-08-09 MED ORDER — CALCIUM CARBONATE-VITAMIN D 500-200 MG-UNIT PO TABS
1.0000 | ORAL_TABLET | Freq: Every day | ORAL | Status: DC
  Administered 2020-08-09 – 2020-08-10 (×2): 1 via ORAL
  Filled 2020-08-09 (×2): qty 1

## 2020-08-09 MED ORDER — SODIUM CHLORIDE 0.9 % IV SOLN: INTRAVENOUS | Status: DC | PRN

## 2020-08-09 MED ORDER — FAMOTIDINE 20 MG PO TABS
ORAL_TABLET | ORAL | Status: AC
  Filled 2020-08-09: qty 1

## 2020-08-09 MED ORDER — ATORVASTATIN CALCIUM 20 MG PO TABS
20.0000 mg | ORAL_TABLET | Freq: Every day | ORAL | Status: DC
  Administered 2020-08-10 – 2020-08-11 (×2): 20 mg via ORAL
  Filled 2020-08-09 (×2): qty 1

## 2020-08-09 MED ORDER — PSYLLIUM 95 % PO PACK
PACK | Freq: Every day | ORAL | Status: DC
  Administered 2020-08-09 – 2020-08-10 (×2): 1 via ORAL
  Filled 2020-08-09 (×4): qty 1

## 2020-08-09 MED ORDER — "VISTASEAL 4 ML SINGLE DOSE KIT "
PACK | CUTANEOUS | Status: DC | PRN
  Administered 2020-08-09: 4 mL via TOPICAL

## 2020-08-09 MED ORDER — ALBUTEROL SULFATE HFA 108 (90 BASE) MCG/ACT IN AERS
INHALATION_SPRAY | RESPIRATORY_TRACT | Status: DC | PRN
  Administered 2020-08-09: 8 via RESPIRATORY_TRACT

## 2020-08-09 MED ORDER — PROPOFOL 10 MG/ML IV BOLUS
INTRAVENOUS | Status: AC
  Filled 2020-08-09: qty 20

## 2020-08-09 MED ORDER — ACETAMINOPHEN 650 MG RE SUPP: 325.0000 mg | RECTAL | Status: DC | PRN

## 2020-08-09 MED ORDER — MAGNESIUM SULFATE 2 GM/50ML IV SOLN
2.0000 g | Freq: Every day | INTRAVENOUS | Status: DC | PRN
  Filled 2020-08-09: qty 50

## 2020-08-09 MED ORDER — HYDRALAZINE HCL 20 MG/ML IJ SOLN: 5.0000 mg | INTRAMUSCULAR | Status: DC | PRN

## 2020-08-09 MED ORDER — ONDANSETRON HCL 4 MG/2ML IJ SOLN
INTRAMUSCULAR | Status: DC | PRN
  Administered 2020-08-09: 4 mg via INTRAVENOUS

## 2020-08-09 MED ORDER — OXYCODONE-ACETAMINOPHEN 5-325 MG PO TABS: 1.0000 | ORAL_TABLET | ORAL | Status: DC | PRN

## 2020-08-09 MED ORDER — CHLORHEXIDINE GLUCONATE CLOTH 2 % EX PADS: 6.0000 | MEDICATED_PAD | Freq: Once | CUTANEOUS | Status: DC

## 2020-08-09 MED ORDER — HEPARIN SOD (PORK) LOCK FLUSH 100 UNIT/ML IV SOLN
INTRAVENOUS | Status: AC
  Filled 2020-08-09: qty 5

## 2020-08-09 MED ORDER — ACETAMINOPHEN 10 MG/ML IV SOLN
INTRAVENOUS | Status: AC
  Filled 2020-08-09: qty 100

## 2020-08-09 MED ORDER — HEPARIN SODIUM (PORCINE) 1000 UNIT/ML IJ SOLN
INTRAMUSCULAR | Status: AC
  Filled 2020-08-09: qty 1

## 2020-08-09 MED ORDER — PHENYLEPHRINE HCL (PRESSORS) 10 MG/ML IV SOLN
INTRAVENOUS | Status: DC | PRN
  Administered 2020-08-09 (×4): 100 ug via INTRAVENOUS

## 2020-08-09 MED ORDER — DILTIAZEM HCL ER BEADS 240 MG PO CP24: 240.0000 mg | ORAL_CAPSULE | Freq: Every day | ORAL | Status: DC

## 2020-08-09 MED ORDER — APIXABAN 2.5 MG PO TABS
2.5000 mg | ORAL_TABLET | Freq: Two times a day (BID) | ORAL | Status: DC
  Administered 2020-08-10 – 2020-08-11 (×3): 2.5 mg via ORAL
  Filled 2020-08-09 (×3): qty 1

## 2020-08-09 MED ORDER — FLUTICASONE FUROATE-VILANTEROL 100-25 MCG/INH IN AEPB
1.0000 | INHALATION_SPRAY | Freq: Every day | RESPIRATORY_TRACT | Status: DC
  Administered 2020-08-10 – 2020-08-11 (×2): 1 via RESPIRATORY_TRACT
  Filled 2020-08-09: qty 28

## 2020-08-09 MED ORDER — METOPROLOL TARTRATE 5 MG/5ML IV SOLN: 2.0000 mg | INTRAVENOUS | Status: DC | PRN

## 2020-08-09 MED ORDER — PREDNISONE 10 MG PO TABS
5.0000 mg | ORAL_TABLET | Freq: Every day | ORAL | Status: DC
  Administered 2020-08-10 – 2020-08-11 (×2): 5 mg via ORAL
  Filled 2020-08-09 (×2): qty 1

## 2020-08-09 MED ORDER — ALBUTEROL SULFATE (2.5 MG/3ML) 0.083% IN NEBU
2.5000 mg | INHALATION_SOLUTION | Freq: Four times a day (QID) | RESPIRATORY_TRACT | Status: DC | PRN
  Administered 2020-08-09 – 2020-08-10 (×2): 2.5 mg via RESPIRATORY_TRACT
  Filled 2020-08-09 (×3): qty 3

## 2020-08-09 MED ORDER — ACETAMINOPHEN 325 MG PO TABS: 325.0000 mg | ORAL_TABLET | ORAL | Status: DC | PRN

## 2020-08-09 MED ORDER — ORAL CARE MOUTH RINSE: 15.0000 mL | Freq: Once | OROMUCOSAL | Status: AC

## 2020-08-09 MED ORDER — SODIUM CHLORIDE 0.9 % IV SOLN: 500.0000 mL | Freq: Once | INTRAVENOUS | Status: DC | PRN

## 2020-08-09 MED ORDER — FAMOTIDINE 20 MG PO TABS
20.0000 mg | ORAL_TABLET | Freq: Once | ORAL | Status: AC
  Administered 2020-08-09: 20 mg via ORAL

## 2020-08-09 MED ORDER — DEXAMETHASONE SODIUM PHOSPHATE 10 MG/ML IJ SOLN
INTRAMUSCULAR | Status: DC | PRN
  Administered 2020-08-09: 10 mg via INTRAVENOUS

## 2020-08-09 MED ORDER — SUGAMMADEX SODIUM 200 MG/2ML IV SOLN
INTRAVENOUS | Status: DC | PRN
  Administered 2020-08-09: 200 mg via INTRAVENOUS

## 2020-08-09 MED ORDER — METOPROLOL SUCCINATE ER 50 MG PO TB24: 25.0000 mg | ORAL_TABLET | Freq: Every day | ORAL | Status: DC

## 2020-08-09 MED ORDER — FLUTICASONE-UMECLIDIN-VILANT 100-62.5-25 MCG/INH IN AEPB: INHALATION_SPRAY | Freq: Every day | RESPIRATORY_TRACT | Status: DC

## 2020-08-09 MED ORDER — LIDOCAINE HCL (CARDIAC) PF 100 MG/5ML IV SOSY
PREFILLED_SYRINGE | INTRAVENOUS | Status: DC | PRN
  Administered 2020-08-09: 50 mg via INTRAVENOUS

## 2020-08-09 MED ORDER — ORAL CARE MOUTH RINSE
15.0000 mL | Freq: Two times a day (BID) | OROMUCOSAL | Status: DC
  Administered 2020-08-09 – 2020-08-11 (×4): 15 mL via OROMUCOSAL

## 2020-08-09 MED ORDER — ALBUTEROL SULFATE HFA 108 (90 BASE) MCG/ACT IN AERS: 1.0000 | INHALATION_SPRAY | Freq: Four times a day (QID) | RESPIRATORY_TRACT | Status: DC | PRN

## 2020-08-09 MED ORDER — SODIUM CHLORIDE 0.9 % IV SOLN: INTRAVENOUS | Status: AC

## 2020-08-09 MED ORDER — FENTANYL CITRATE (PF) 100 MCG/2ML IJ SOLN: 25.0000 ug | INTRAMUSCULAR | Status: DC | PRN

## 2020-08-09 MED ORDER — NITROGLYCERIN IN D5W 200-5 MCG/ML-% IV SOLN: 5.0000 ug/min | INTRAVENOUS | Status: DC

## 2020-08-09 MED ORDER — SENNOSIDES-DOCUSATE SODIUM 8.6-50 MG PO TABS: 1.0000 | ORAL_TABLET | Freq: Every day | ORAL | Status: DC

## 2020-08-09 MED ORDER — PHENOL 1.4 % MT LIQD
1.0000 | OROMUCOSAL | Status: DC | PRN
  Filled 2020-08-09: qty 177

## 2020-08-09 MED ORDER — CHLORHEXIDINE GLUCONATE 0.12 % MT SOLN
15.0000 mL | Freq: Once | OROMUCOSAL | Status: AC
  Administered 2020-08-09: 15 mL via OROMUCOSAL

## 2020-08-09 MED ORDER — ASPIRIN EC 81 MG PO TBEC
81.0000 mg | DELAYED_RELEASE_TABLET | Freq: Every day | ORAL | Status: DC
  Administered 2020-08-10: 81 mg via ORAL
  Filled 2020-08-09 (×2): qty 1

## 2020-08-09 MED ORDER — ROCURONIUM BROMIDE 100 MG/10ML IV SOLN
INTRAVENOUS | Status: DC | PRN
  Administered 2020-08-09: 10 mg via INTRAVENOUS
  Administered 2020-08-09: 50 mg via INTRAVENOUS
  Administered 2020-08-09: 10 mg via INTRAVENOUS

## 2020-08-09 MED ORDER — METOPROLOL TARTRATE 50 MG PO TABS
50.0000 mg | ORAL_TABLET | Freq: Two times a day (BID) | ORAL | Status: DC
  Administered 2020-08-09 – 2020-08-11 (×4): 50 mg via ORAL
  Filled 2020-08-09 (×4): qty 1

## 2020-08-09 MED ORDER — ALUM & MAG HYDROXIDE-SIMETH 200-200-20 MG/5ML PO SUSP: 15.0000 mL | ORAL | Status: DC | PRN

## 2020-08-09 MED ORDER — PHENYLEPHRINE HCL (PRESSORS) 10 MG/ML IV SOLN
INTRAVENOUS | Status: AC
  Filled 2020-08-09: qty 1

## 2020-08-09 MED ORDER — SENNOSIDES-DOCUSATE SODIUM 8.6-50 MG PO TABS: 1.0000 | ORAL_TABLET | Freq: Every evening | ORAL | Status: DC | PRN

## 2020-08-09 MED ORDER — SODIUM CHLORIDE 0.9 % IV SOLN: INTRAVENOUS | Status: DC

## 2020-08-09 MED ORDER — ENOXAPARIN SODIUM 40 MG/0.4ML IJ SOSY: 40.0000 mg | PREFILLED_SYRINGE | INTRAMUSCULAR | Status: DC

## 2020-08-09 MED ORDER — POTASSIUM CHLORIDE CRYS ER 20 MEQ PO TBCR
20.0000 meq | EXTENDED_RELEASE_TABLET | Freq: Every day | ORAL | Status: DC
  Administered 2020-08-09 – 2020-08-11 (×3): 20 meq via ORAL
  Filled 2020-08-09 (×3): qty 1

## 2020-08-09 MED ORDER — 0.9 % SODIUM CHLORIDE (POUR BTL) OPTIME
TOPICAL | Status: DC | PRN
  Administered 2020-08-09: 500 mL

## 2020-08-09 MED ORDER — CEFAZOLIN SODIUM-DEXTROSE 2-4 GM/100ML-% IV SOLN
2.0000 g | INTRAVENOUS | Status: AC
  Administered 2020-08-09: 2 g via INTRAVENOUS

## 2020-08-09 MED ORDER — GUAIFENESIN-DM 100-10 MG/5ML PO SYRP
15.0000 mL | ORAL_SOLUTION | ORAL | Status: DC | PRN
  Administered 2020-08-10: 15 mL via ORAL
  Filled 2020-08-09 (×2): qty 15

## 2020-08-09 MED ORDER — SODIUM CHLORIDE 0.9 % IV SOLN
INTRAVENOUS | Status: DC | PRN
  Administered 2020-08-09: 30 ug/min via INTRAVENOUS

## 2020-08-09 MED ORDER — PROPOFOL 10 MG/ML IV BOLUS
INTRAVENOUS | Status: DC | PRN
  Administered 2020-08-09: 70 mg via INTRAVENOUS

## 2020-08-09 MED ORDER — MIDAZOLAM HCL 2 MG/2ML IJ SOLN
INTRAMUSCULAR | Status: AC
  Filled 2020-08-09: qty 2

## 2020-08-09 MED ORDER — FLUTICASONE-UMECLIDIN-VILANT 100-62.5-25 MCG/INH IN AEPB: 1.0000 | INHALATION_SPRAY | Freq: Every day | RESPIRATORY_TRACT | Status: DC

## 2020-08-09 MED ORDER — ONDANSETRON HCL 4 MG/2ML IJ SOLN: 4.0000 mg | Freq: Once | INTRAMUSCULAR | Status: DC | PRN

## 2020-08-09 MED ORDER — VITAMIN D (ERGOCALCIFEROL) 1.25 MG (50000 UNIT) PO CAPS
50000.0000 [IU] | ORAL_CAPSULE | ORAL | Status: DC
  Administered 2020-08-11: 50000 [IU] via ORAL
  Filled 2020-08-09: qty 1

## 2020-08-09 MED ORDER — ACETAMINOPHEN 10 MG/ML IV SOLN
INTRAVENOUS | Status: DC | PRN
  Administered 2020-08-09: 1000 mg via INTRAVENOUS

## 2020-08-09 MED ORDER — DOCUSATE SODIUM 100 MG PO CAPS
100.0000 mg | ORAL_CAPSULE | Freq: Every day | ORAL | Status: DC
  Administered 2020-08-10 – 2020-08-11 (×2): 100 mg via ORAL
  Filled 2020-08-09 (×2): qty 1

## 2020-08-09 MED ORDER — UMECLIDINIUM BROMIDE 62.5 MCG/INH IN AEPB
1.0000 | INHALATION_SPRAY | Freq: Every day | RESPIRATORY_TRACT | Status: DC
  Administered 2020-08-10 – 2020-08-11 (×2): 1 via RESPIRATORY_TRACT
  Filled 2020-08-09: qty 7

## 2020-08-09 MED ORDER — CHLORHEXIDINE GLUCONATE 0.12 % MT SOLN
OROMUCOSAL | Status: AC
  Filled 2020-08-09: qty 15

## 2020-08-09 MED ORDER — SORBITOL 70 % SOLN
30.0000 mL | Freq: Every day | Status: DC | PRN
  Filled 2020-08-09: qty 30

## 2020-08-09 MED ORDER — HEPARIN SODIUM (PORCINE) 1000 UNIT/ML IJ SOLN
INTRAMUSCULAR | Status: DC | PRN
  Administered 2020-08-09: 5000 [IU] via INTRAVENOUS

## 2020-08-09 MED ORDER — CEFAZOLIN SODIUM-DEXTROSE 2-4 GM/100ML-% IV SOLN
2.0000 g | Freq: Three times a day (TID) | INTRAVENOUS | Status: AC
  Administered 2020-08-09 – 2020-08-10 (×2): 2 g via INTRAVENOUS
  Filled 2020-08-09 (×2): qty 100

## 2020-08-09 MED ORDER — CEFAZOLIN SODIUM-DEXTROSE 2-4 GM/100ML-% IV SOLN
INTRAVENOUS | Status: AC
  Filled 2020-08-09: qty 100

## 2020-08-09 MED ORDER — FENTANYL CITRATE (PF) 100 MCG/2ML IJ SOLN
INTRAMUSCULAR | Status: DC | PRN
  Administered 2020-08-09: 50 ug via INTRAVENOUS

## 2020-08-09 MED ORDER — DOPAMINE-DEXTROSE 3.2-5 MG/ML-% IV SOLN: 3.0000 ug/kg/min | INTRAVENOUS | Status: DC

## 2020-08-09 SURGICAL SUPPLY — 77 items
ADH SKN CLS APL DERMABOND .7 (GAUZE/BANDAGES/DRESSINGS) ×2
APL PRP STRL LF DISP 70% ISPRP (MISCELLANEOUS) ×2
APPLIER CLIP 11 MED OPEN (CLIP)
APPLIER CLIP 9.375 SM OPEN (CLIP)
APR CLP MED 11 20 MLT OPN (CLIP)
APR CLP SM 9.3 20 MLT OPN (CLIP)
BAG DECANTER FOR FLEXI CONT (MISCELLANEOUS) ×3 IMPLANT
BAG ISL LRG 20X20 DRWSTRG (DRAPES)
BAG ISOLATATION DRAPE 20X20 ST (DRAPES) IMPLANT
BLADE SURG 15 STRL LF DISP TIS (BLADE) ×2 IMPLANT
BLADE SURG 15 STRL SS (BLADE) ×3
BLADE SURG SZ11 CARB STEEL (BLADE) ×3 IMPLANT
BOOT SUTURE AID YELLOW STND (SUTURE) ×3 IMPLANT
BRUSH SCRUB EZ  4% CHG (MISCELLANEOUS) ×3
BRUSH SCRUB EZ 4% CHG (MISCELLANEOUS) ×2 IMPLANT
CANISTER SUCT 1200ML W/VALVE (MISCELLANEOUS) ×3 IMPLANT
CHLORAPREP W/TINT 26 (MISCELLANEOUS) ×3 IMPLANT
CLIP APPLIE 11 MED OPEN (CLIP) IMPLANT
CLIP APPLIE 9.375 SM OPEN (CLIP) IMPLANT
DERMABOND ADVANCED (GAUZE/BANDAGES/DRESSINGS) ×1
DERMABOND ADVANCED .7 DNX12 (GAUZE/BANDAGES/DRESSINGS) ×2 IMPLANT
DRAPE INCISE IOBAN 66X45 STRL (DRAPES) ×3 IMPLANT
DRAPE ISOLATE BAG 20X20 STRL (DRAPES)
DRESSING SURGICEL FIBRLLR 1X2 (HEMOSTASIS) IMPLANT
DRSG OPSITE POSTOP 4X6 (GAUZE/BANDAGES/DRESSINGS) ×1 IMPLANT
DRSG SURGICEL FIBRILLAR 1X2 (HEMOSTASIS) ×3
ELECT CAUTERY BLADE 6.4 (BLADE) ×3 IMPLANT
ELECT REM PT RETURN 9FT ADLT (ELECTROSURGICAL) ×3
ELECTRODE REM PT RTRN 9FT ADLT (ELECTROSURGICAL) ×2 IMPLANT
GAUZE 4X4 16PLY ~~LOC~~+RFID DBL (SPONGE) ×3 IMPLANT
GLOVE SURG SYN 7.0 (GLOVE) ×6 IMPLANT
GLOVE SURG SYN 7.0 PF PI (GLOVE) ×4 IMPLANT
GLOVE SURG UNDER LTX SZ7.5 (GLOVE) ×3 IMPLANT
GOWN STRL REUS W/ TWL LRG LVL3 (GOWN DISPOSABLE) ×2 IMPLANT
GOWN STRL REUS W/ TWL XL LVL3 (GOWN DISPOSABLE) ×4 IMPLANT
GOWN STRL REUS W/TWL LRG LVL3 (GOWN DISPOSABLE) ×3
GOWN STRL REUS W/TWL XL LVL3 (GOWN DISPOSABLE) ×6
HEMOSTAT SURGICEL 2X3 (HEMOSTASIS) ×3 IMPLANT
IV NS 500ML (IV SOLUTION) ×3
IV NS 500ML BAXH (IV SOLUTION) ×2 IMPLANT
KIT TURNOVER KIT A (KITS) ×3 IMPLANT
LABEL OR SOLS (LABEL) ×3 IMPLANT
LOOP RED MAXI  1X406MM (MISCELLANEOUS) ×3
LOOP VESSEL MAXI  1X406 RED (MISCELLANEOUS) ×6
LOOP VESSEL MAXI 1X406 RED (MISCELLANEOUS) ×4 IMPLANT
LOOP VESSEL MINI 0.8X406 BLUE (MISCELLANEOUS) ×4 IMPLANT
LOOPS BLUE MINI 0.8X406MM (MISCELLANEOUS) ×3
MANIFOLD NEPTUNE II (INSTRUMENTS) ×3 IMPLANT
NDL SAFETY ECLIPSE 18X1.5 (NEEDLE) ×2 IMPLANT
NEEDLE HYPO 18GX1.5 SHARP (NEEDLE) ×3
NS IRRIG 500ML POUR BTL (IV SOLUTION) ×3 IMPLANT
PACK BASIN MAJOR ARMC (MISCELLANEOUS) ×3 IMPLANT
PACK UNIVERSAL (MISCELLANEOUS) ×3 IMPLANT
PATCH CAROTID ECM VASC 1X10 (Prosthesis & Implant Heart) ×1 IMPLANT
PENCIL ELECTRO HAND CTR (MISCELLANEOUS) ×1 IMPLANT
SET WALTER ACTIVATION W/DRAPE (SET/KITS/TRAYS/PACK) ×3 IMPLANT
SPONGE T-LAP 18X18 ~~LOC~~+RFID (SPONGE) ×6 IMPLANT
SUT MNCRL 4-0 (SUTURE) ×3
SUT MNCRL 4-0 27XMFL (SUTURE) ×2
SUT PROLENE 5 0 RB 1 DA (SUTURE) ×9 IMPLANT
SUT PROLENE 6 0 BV (SUTURE) ×13 IMPLANT
SUT PROLENE 7 0 BV 1 (SUTURE) ×6 IMPLANT
SUT SILK 2 0 (SUTURE) ×3
SUT SILK 2-0 18XBRD TIE 12 (SUTURE) ×2 IMPLANT
SUT SILK 3 0 (SUTURE) ×3
SUT SILK 3-0 18XBRD TIE 12 (SUTURE) ×2 IMPLANT
SUT SILK 4 0 (SUTURE) ×3
SUT SILK 4-0 18XBRD TIE 12 (SUTURE) ×2 IMPLANT
SUT VIC AB 2-0 CT1 27 (SUTURE) ×9
SUT VIC AB 2-0 CT1 TAPERPNT 27 (SUTURE) ×4 IMPLANT
SUT VIC AB 3-0 SH 27 (SUTURE) ×3
SUT VIC AB 3-0 SH 27X BRD (SUTURE) ×2 IMPLANT
SUT VICRYL+ 3-0 36IN CT-1 (SUTURE) ×6 IMPLANT
SUTURE MNCRL 4-0 27XMF (SUTURE) ×2 IMPLANT
SYR 20ML LL LF (SYRINGE) ×3 IMPLANT
SYR 5ML LL (SYRINGE) ×3 IMPLANT
TRAY FOLEY MTR SLVR 16FR STAT (SET/KITS/TRAYS/PACK) ×3 IMPLANT

## 2020-08-09 NOTE — Anesthesia Procedure Notes (Signed)
Arterial Line Insertion Start/End7/13/2022 7:40 AM, 08/09/2020 7:48 AM Performed by: Emmie Niemann, MD, anesthesiologist  Preanesthetic checklist: patient identified, IV checked, site marked, risks and benefits discussed, surgical consent, monitors and equipment checked, pre-op evaluation, timeout performed and anesthesia consent Left, radial was placed Catheter size: 20 G Hand hygiene performed , maximum sterile barriers used  and Seldinger technique used  Attempts: 1 Procedure performed without using ultrasound guided technique. Following insertion, dressing applied and Biopatch. Post procedure assessment: normal  Patient tolerated the procedure well with no immediate complications.

## 2020-08-09 NOTE — Transfer of Care (Signed)
Immediate Anesthesia Transfer of Care Note  Patient: [name]  Procedure(s) Performed: RIGHT COMMON AND PROFUNDA FEMORAL ENDARTERECTOMY (Right: Groin) APPLICATION OF CELL SAVER (Groin)  Patient Location: PACU  Anesthesia Type:General  Level of Consciousness: awake  Airway & Oxygen Therapy: Patient Spontanous Breathing and Patient connected to face mask oxygen  Post-op Assessment: Report given to RN and Post -op Vital signs reviewed and stable  Post vital signs: Reviewed and stable  Last Vitals:  Vitals Value Taken Time  BP 113/57 08/09/20 1033  Temp    Pulse 30 08/09/20 1035  Resp 13 08/09/20 1035  SpO2 93 % 08/09/20 1035  Vitals shown include unvalidated device data.  Last Pain:  Vitals:   08/09/20 0635  PainSc: 0-No pain         Complications: No notable events documented.

## 2020-08-09 NOTE — Interval H&P Note (Signed)
History and Physical Interval Note:  08/09/2020 7:25 AM  [name]  has presented today for surgery, with the diagnosis of ASO WITH ULCERATION.  The various methods of treatment have been discussed with the patient and family. After consideration of risks, benefits and other options for treatment, the patient has consented to  Procedure(s): ENDARTERECTOMY FEMORAL (Right) APPLICATION OF CELL SAVER (N/A) as a surgical intervention.  The patient's history has been reviewed, patient examined, no change in status, stable for surgery.  I have reviewed the patient's chart and labs.  Questions were answered to the patient's satisfaction.     [name]

## 2020-08-09 NOTE — Anesthesia Postprocedure Evaluation (Signed)
Anesthesia Post Note  Patient: [name]  Procedure(s) Performed: RIGHT COMMON AND PROFUNDA FEMORAL ENDARTERECTOMY (Right: Groin) APPLICATION OF CELL SAVER (Groin)  Patient location during evaluation: PACU Anesthesia Type: General Level of consciousness: awake and alert Pain management: pain level controlled Vital Signs Assessment: post-procedure vital signs reviewed and stable Respiratory status: spontaneous breathing, nonlabored ventilation, respiratory function stable and patient connected to nasal cannula oxygen Cardiovascular status: blood pressure returned to baseline and stable Postop Assessment: no apparent nausea or vomiting Anesthetic complications: no   No notable events documented.   Last Vitals:  Vitals:   08/09/20 1110 08/09/20 1200  BP:  (!) 128/51  Pulse: (!) 51 78  Resp: (!) 29 (!) 27  Temp: (!) 36.3 C   SpO2: 96% 93%    Last Pain:  Vitals:   08/09/20 1200  TempSrc:   PainSc: 0-No pain                 Arita Miss

## 2020-08-09 NOTE — Anesthesia Preprocedure Evaluation (Addendum)
Anesthesia Evaluation  Patient identified by MRN, date of birth, ID band Patient awake    Reviewed: Allergy & Precautions, NPO status , [name] Chart, lab work & pertinent test results  History of Anesthesia Complications Negative for: history of anesthetic complications  Airway Mallampati: II  TM Distance: >3 FB Neck ROM: Full    Dental  (+) Edentulous Upper, Edentulous Lower   Pulmonary neg sleep apnea, COPD,  oxygen dependent, former smoker,    breath sounds clear to auscultation- rhonchi (-) wheezing      Cardiovascular hypertension, Pt. on medications + Peripheral Vascular Disease and +CHF  (-) CAD, (-) Past MI, (-) Cardiac Stents and (-) CABG + dysrhythmias Atrial Fibrillation  Rhythm:Irregular Rate:Normal - Systolic murmurs and - Diastolic murmurs Echo 5/46/27: 1. Left ventricular ejection fraction, by estimation, is 55 to 60%. The  left ventricle has normal function. The left ventricle has no regional  wall motion abnormalities. Left ventricular diastolic parameters are  consistent with Grade I diastolic  dysfunction (impaired relaxation). There is the interventricular septum is  flattened in diastole ('D' shaped left ventricle), consistent with right  ventricular volume overload.  2. Right ventricular systolic function is moderately reduced. The right  ventricular size is moderately enlarged. There is moderately elevated  pulmonary artery systolic pressure.  3. Left atrial size was mildly dilated.  4. Right atrial size was severely dilated.  5. The mitral valve is abnormal. Mild mitral valve regurgitation.  6. Tricuspid valve regurgitation is moderate to severe.  7. The aortic valve is calcified. Aortic valve regurgitation is mild.  Mild to moderate aortic valve sclerosis/calcification is present, without  any evidence of aortic stenosis.    Neuro/Psych neg Seizures negative neurological ROS  negative psych  ROS   GI/Hepatic negative GI ROS, Neg liver ROS,   Endo/Other  negative endocrine ROSneg diabetes  Renal/GU negative Renal ROS     Musculoskeletal negative musculoskeletal ROS (+)   Abdominal (+) - obese,   Peds  Hematology negative hematology ROS (+)   Anesthesia Other Findings Past Medical History: No date: (HFpEF) heart failure with preserved ejection fraction (HCC) No date: Aortic atherosclerosis (HCC) No date: Cardiac murmur     Comment:  Grade I/VI medium pitched mid systolic blowing at lower               LSB No date: COPD (chronic obstructive pulmonary disease) (HCC) No date: Coronary artery disease No date: Dependence on supplemental oxygen No date: HLD (hyperlipidemia) No date: Hypertension No date: Paroxysmal atrial fibrillation (HCC) No date: PVD (peripheral vascular disease) (HCC) No date: Skin cancer     Comment:  nose and ear No date: Thoracic aortic ectasia (HCC)     Comment:  a.) measured 3.6 x 3.3 cm on 10/08/2019   Reproductive/Obstetrics                           Anesthesia Physical Anesthesia Plan  ASA: 4  Anesthesia Plan: General   Post-op Pain Management:    Induction: Intravenous  PONV Risk Score and Plan: 1 and Ondansetron and Dexamethasone  Airway Management Planned: Oral ETT  Additional Equipment: Arterial line  Intra-op Plan:   Post-operative Plan: Extubation in OR  Informed Consent: I have reviewed the patients History and Physical, chart, labs and discussed the procedure including the risks, benefits and alternatives for the proposed anesthesia with the patient or authorized representative who has indicated his/her understanding and acceptance.  Dental advisory given  Plan Discussed with: CRNA and Anesthesiologist  Anesthesia Plan Comments:         Anesthesia Quick Evaluation

## 2020-08-09 NOTE — Op Note (Signed)
OPERATIVE NOTE   PROCEDURE: Right common femoral, superficial femoral and profunda femoris endarterectomy with Cormatrix patch angioplasty. Redo vascular surgery with revision of the right distal limb of the aortobifemoral bypass graft   PRE-OPERATIVE DIAGNOSIS: Atherosclerotic occlusive disease right lower extremity with rest pain symptoms and preulcerative changes  POST-OPERATIVE DIAGNOSIS: Same  CO-SURGEON: Katha Cabal, MD and Algernon Huxley, M.D.  ASSISTANT(S): None  ANESTHESIA: general  ESTIMATED BLOOD LOSS: 100 cc  FINDING(S): Profound calcific plaque noted of the right common femoral extending past the initial bifurcation of the profunda femoris arteries as well as the SFA  SPECIMEN(S):  Calcific plaque from the common femoral, superficial femoral and the profunda femoris artery  INDICATIONS:   [name] 87 y.o. y.o.male who presents with complaints of lifestyle limiting claudication and pain continuously in the right lower extremity. The patient has documented severe atherosclerotic occlusive disease and has undergone minimally invasive treatments in the past. However, at this point his primary area of stricture stenosis resides in the common femoral and origins of the superficial femoral and profunda femoris extending into these arteries and therefore this is not amenable to intervention. The patient is therefore undergoing open endarterectomy. The risks and benefits of surgery have been reviewed with the patient, all questions have answered; alternative therapies have been reviewed as well and the patient has agreed to proceed with surgical open repair.  DESCRIPTION: After obtaining full informed written consent, the patient was brought back to the operating room and placed supine upon the operating table.  The patient received IV antibiotics prior to induction.  After obtaining adequate anesthesia, the patient was  prepped and draped in the standard fashion for right femoral exposure.  Co-surgeons are required because this is a complicated procedure with work being performed simultaneously from both the patient's right left sides.  This also expedites the procedure making a shorter operative time reducing complications and improving patient safety.  Attention was turned to the right groin with Dr. Lucky Cowboy working on the patient's right and myself working on the left of the patient.  Vertical  Incision was made over the right femoral anastomosis and common femoral artery and dissection carried down to the common femoral artery with electrocautery.  We dissected out the distal limb of the aortobifemoral graft and the common femoral artery at the level of the ileal inguinal ligament.  The dissection was then carried down to the femoral bifurcation.  On initial inspection, the common femoral artery was: densely calcified and there was no palpable pulse noted.    Subsequently the dissection was continued to include all circumflex branches and the profunda femoral artery and superficial femoral artery. The superficial femoral artery was dissected circumferentially for a distance of approximately 3-4 cm and the profunda femoris was dissected circumferentially out to the fourth order branches individual vessel loops were placed around each branch.  Control of all branches was obtained with vessel loops.  A softer area in the aortobifemoral limb amendable to clamping was identified at the level of the ileal inguinal ligament and using an angled Fogarty vascular clamp so that both the common femoral artery as well as the dacryon limb are able to be clamped as a single unit.    The patient was given 5000 units of Heparin intravenously, which was a therapeutic bolus.   After waiting 3 minutes, the distal external iliac artery was clamped and all of the vessel loops were placed under tension.  Arteriotomy was made in the distal  aspect of  the dacryon limb with a 11-blade and extended it with a Potts scissor proximally and distally extending the distal end down the profunda femoris for approximately 3 cm.   Endarterectomy was then performed under direct visualization using a freer elevator and a right angle from the mid common femoral extending up both proximally into the dacryon graft and distally down to the fourth order branches of the profunda femoris. Proximally the endarterectomy was brought up to the level of the clamp where a clean edge was obtained. Distally the endarterectomy was carried down to a soft spot in the SFA where a feathered edge would was obtained.  6-0 Prolene interrupted tacking sutures were placed to secure the plaque in the profunda femoris.  Since the suture line of the original aortobifemoral bypass graft had been disrupted both the medial and lateral Prolene sutures were identified to reestablish the integrity of the suture line interrupted 5-0 Prolene sutures were then used to secure the original suture line and in fact the remnants of the previous Prolene suture were incorporated into the knot as well.  A total of five 5-0 Prolene sutures in an interrupted fashion were utilized.  The superficial femoral was treated with an eversion technique extending endarterectomy approximately 2 cm distally.   At this point, a corematrix patch was fashioned for the geometry of the arteriotomy.  The patch was sewn to the artery with 2 running stitches of 6-0 Prolene, running from each end.  Prior to completing the patch angioplasty, the profunda femoral artery was flushed as was the superficial femoral artery. The system was then forward flushed. The endarterectomy site was then irrigated copiously with heparinized saline. The patch angioplasty was completed in the usual fashion.  Flow was then reestablished first to the profunda femoris and then the superficial femoral artery. Any gaps or bleeding sites in the suture line were  easily controlled with a 6-0 Prolene suture. Doppler is then delivered onto the field and the SFA as well as the profunda femoris arteries were interrogated and found to have triphasic Doppler signals.  The right groin was then irrigated copiously with sterile saline and subsequently Evicel and Surgicel were placed in the wound. The incision was repaired with a double layer of 2-0 Vicryl, a double layer of 3-0 Vicryl, and a layer of 4-0 Monocryl in a subcuticular fashion.  The skin was cleaned, dried, and reinforced with Dermabond.  COMPLICATIONS: None  CONDITION: Carlynn Purl, M.D. [facility] Office: [phone]  08/09/2020, 12:45 PM

## 2020-08-09 NOTE — Op Note (Signed)
OPERATIVE NOTE   PROCEDURE: 1.   Right common femoral, profunda femoris, and superficial femoral artery endarterectomies and patch angioplasty 2.   Redo right femoral exploration after previous surgery    PRE-OPERATIVE DIAGNOSIS: 1.Atherosclerotic occlusive disease right lower extremities with ulceration 2.  Previous aortobifemoral bypass with occlusion of the right femoral artery and femoral bifurcation just below the previous bypass  POST-OPERATIVE DIAGNOSIS: Same  SURGEON: [name], MD  CO-surgeon: Hortencia Pilar, MD  ANESTHESIA:  general  ESTIMATED BLOOD LOSS: 100 cc  FINDING(S): 1.  significant plaque in right common femoral, profunda femoris, and superficial femoral arteries  SPECIMEN(S):  Right common femoral, profunda femoris, and superficial femoral artery plaque.  INDICATIONS:    Patient presents with rest pain and nonhealing ulceration on the right foot.  He has a previous aortobifemoral bypass and the limb was actually open, but the common femoral artery below the limb and the proximal portions of the profunda femoris artery as well as the proximal and entire superficial femoral artery were occluded.  Right femoral endarterectomy is planned to try to improve perfusion.  The risks and benefits as well as alternative therapies including intervention were reviewed in detail all questions were answered the patient agrees to proceed with surgery.  DESCRIPTION: After obtaining full informed written consent, the patient was brought back to the operating room and placed supine upon the operating table.  The patient received IV antibiotics prior to induction.  After obtaining adequate anesthesia, the patient was prepped and draped in the standard fashion appropriate time out is called.    Vertical incision was created overlying the right femoral arteries. The common femoral artery proximally, and superficial femoral artery, and primary profunda femoris artery branches were  encircled with vessel loops and prepared for control. The right femoral arteries were found to have significant plaque from the common femoral artery into the profunda and superficial femoral arteries.  We dissected out the profunda femoris artery beyond the initial branches and down to a large bifurcation off of the main profunda femoris artery.  This is where they were encircled with Vesseloops   5000 units of heparin was given and allowed circulate for 5 minutes.   Attention is then turned to the right femoral artery.  An arteriotomy is made with 11 blade and extended with Potts scissors in the common femoral artery and carried down onto the first 4 cm of the profunda femoris artery artery.  The arteriotomy was taken proximally up into the distal portion of the dacryon graft from the previous aortobifemoral bypass.  An endarterectomy was then performed. The [facility] was used to create a plane. The proximal endpoint was created with hemostats both in the graft and the native common femoral artery.  The plaque was highly calcific. This was in the proximal common femoral artery. An eversion endarterectomy was then performed for the first 2-3 cm of the superficial femoral artery.  This would allow some sort of pedal access for a stump of the SFA to consider revascularization in the future if he still has wound issues or problems.  Attempts at right leg bypass or endoluminal revascularization of the SFA and tibial vessels was not felt to be likely to be successful due to the long nature of the disease and was not worth the risk of the extended operative time given his severe COPD and oxygen dependence as well as other comorbidities.  The distal endpoint of the profunda femoris artery endarterectomy was created with gentle traction and the  distal endpoint was taken down to just above the large bifurcation of the main profunda femoris artery beyond the 2 initial side branches.  A fairly clean endpoint was  seen.  This was a fairly extensive endarterectomy well down the femoris artery.  The Cormatrix patcth is then selected and prepared for a patch angioplasty.  It is cut and beveled and started at the proximal endpoint with a 6-0 Prolene suture.  Approximately one half of the suture line is run medially and laterally and the distal end point was cut and bevelled to match the arteriotomy.  A second 6-0 Prolene was started at the distal end point and run to the mid portion to complete the arteriotomy.  The vessel was flushed prior to release of control and completion of the anastomosis.  At this point, flow was established first to the profunda femoris artery and then to the superficial femoral artery. Easily palpable pulses are noted well beyond the anastomosis in the distal profunda femoris artery.  Fibrillar and Vistacel topical hemostatic agents were placed in the femoral incision and hemostasis was complete. The femoral incision was then closed in a layered fashion with 2 layers of 2-0 Vicryl, 2 layers of 3-0 Vicryl, and 4-0 Monocryl for the skin closure. Dermabond and sterile dressing were then placed over the incision.  The patient was then awakened from anesthesia and taken to the recovery room in stable condition having tolerated the procedure well.  COMPLICATIONS: None  CONDITION: Stable     [name] 08/09/2020 10:36 AM   This note was created with Dragon Medical transcription system. Any errors in dictation are purely unintentional.

## 2020-08-09 NOTE — Anesthesia Procedure Notes (Signed)
Procedure Name: Intubation Date/Time: 08/09/2020 7:43 AM Performed by: Biagio Borg, CRNA Pre-anesthesia Checklist: Patient identified, Emergency Drugs available, Suction available and Patient being monitored Patient Re-evaluated:Patient Re-evaluated prior to induction Oxygen Delivery Method: Circle system utilized Preoxygenation: Pre-oxygenation with 100% oxygen Induction Type: IV induction Ventilation: Mask ventilation without difficulty Laryngoscope Size: McGraph and 4 Grade View: Grade II Tube type: Oral Tube size: 7.0 mm Number of attempts: 1 Airway Equipment and Method: Stylet Placement Confirmation: ETT inserted through vocal cords under direct vision, positive ETCO2 and breath sounds checked- equal and bilateral Secured at: 21 cm Tube secured with: Tape Dental Injury: Teeth and Oropharynx as per pre-operative assessment

## 2020-08-10 ENCOUNTER — Encounter: Payer: Self-pay | Admitting: Vascular Surgery

## 2020-08-10 DIAGNOSIS — E43 Unspecified severe protein-calorie malnutrition: Secondary | ICD-10-CM | POA: Insufficient documentation

## 2020-08-10 DIAGNOSIS — J9811 Atelectasis: Secondary | ICD-10-CM | POA: Diagnosis not present

## 2020-08-10 DIAGNOSIS — I70221 Atherosclerosis of native arteries of extremities with rest pain, right leg: Secondary | ICD-10-CM

## 2020-08-10 LAB — CBC
HCT: 28.2 % — ABNORMAL LOW (ref 39.0–52.0)
Hemoglobin: 9.2 g/dL — ABNORMAL LOW (ref 13.0–17.0)
MCH: 29.6 pg (ref 26.0–34.0)
MCHC: 32.6 g/dL (ref 30.0–36.0)
MCV: 90.7 fL (ref 80.0–100.0)
Platelets: 230 10*3/uL (ref 150–400)
RBC: 3.11 MIL/uL — ABNORMAL LOW (ref 4.22–5.81)
RDW: 14.9 % (ref 11.5–15.5)
WBC: 9.3 10*3/uL (ref 4.0–10.5)
nRBC: 0 % (ref 0.0–0.2)

## 2020-08-10 LAB — BASIC METABOLIC PANEL
Anion gap: 5 (ref 5–15)
BUN: 28 mg/dL — ABNORMAL HIGH (ref 8–23)
CO2: 24 mmol/L (ref 22–32)
Calcium: 7.8 mg/dL — ABNORMAL LOW (ref 8.9–10.3)
Chloride: 104 mmol/L (ref 98–111)
Creatinine, Ser: 0.89 mg/dL (ref 0.61–1.24)
GFR, Estimated: >60 mL/min (ref >60)
Glucose, Bld: 181 mg/dL — ABNORMAL HIGH (ref 70–99)
Potassium: 4.4 mmol/L (ref 3.5–5.1)
Sodium: 133 mmol/L — ABNORMAL LOW (ref 135–145)

## 2020-08-10 MED ORDER — ENSURE ENLIVE PO LIQD
237.0000 mL | Freq: Two times a day (BID) | ORAL | Status: DC
  Administered 2020-08-10 – 2020-08-11 (×2): 237 mL via ORAL

## 2020-08-10 MED ORDER — HEMOSTATIC AGENTS (NO CHARGE) OPTIME
TOPICAL | Status: DC | PRN
  Administered 2020-08-09: 1 via TOPICAL

## 2020-08-10 MED ORDER — FAMOTIDINE 20 MG PO TABS
20.0000 mg | ORAL_TABLET | Freq: Two times a day (BID) | ORAL | Status: DC
  Administered 2020-08-10 – 2020-08-11 (×3): 20 mg via ORAL
  Filled 2020-08-10 (×3): qty 1

## 2020-08-10 MED ORDER — ASCORBIC ACID 500 MG PO TABS
250.0000 mg | ORAL_TABLET | Freq: Two times a day (BID) | ORAL | Status: DC
  Administered 2020-08-10 – 2020-08-11 (×2): 250 mg via ORAL
  Filled 2020-08-10 (×2): qty 1

## 2020-08-10 MED ORDER — ALBUTEROL SULFATE (2.5 MG/3ML) 0.083% IN NEBU: 2.5000 mg | INHALATION_SOLUTION | Freq: Four times a day (QID) | RESPIRATORY_TRACT | Status: DC

## 2020-08-10 MED ORDER — ALBUTEROL SULFATE (2.5 MG/3ML) 0.083% IN NEBU
2.5000 mg | INHALATION_SOLUTION | Freq: Four times a day (QID) | RESPIRATORY_TRACT | Status: DC
  Administered 2020-08-10 – 2020-08-11 (×3): 2.5 mg via RESPIRATORY_TRACT
  Filled 2020-08-10 (×2): qty 3

## 2020-08-10 MED ORDER — ADULT MULTIVITAMIN W/MINERALS CH
1.0000 | ORAL_TABLET | Freq: Every day | ORAL | Status: DC
  Administered 2020-08-11: 1 via ORAL
  Filled 2020-08-10: qty 1

## 2020-08-10 NOTE — Progress Notes (Signed)
Initial Nutrition Assessment  DOCUMENTATION CODES:   Severe malnutrition in context of chronic illness  INTERVENTION:   Ensure Enlive po BID, each supplement provides 350 kcal and 20 grams of protein  MVI po daily   Vitamin C 250mg  po BID   NUTRITION DIAGNOSIS:   Severe Malnutrition related to chronic illness (COPD, CHF) as evidenced by moderate fat depletion, severe fat depletion, moderate muscle depletion, severe muscle depletion.  GOAL:   Patient will meet greater than or equal to 90% of their needs  MONITOR:   PO intake, Supplement acceptance, Labs, Weight trends, Skin, I & O's  REASON FOR ASSESSMENT:   Consult Assessment of nutrition requirement/status  ASSESSMENT:   87 y/o male with h/o HLD, Afib, CHF, HTN, CAD, COPD, COVID 19 (2020) and aspiration PNA who is admitted with pain and nonhealing ulceration to the right foot now status post right femoral endarterectomy 7/13  Pt reports good appetite and oral intake; pt documented to be eating 100% of meals in hospital. RD will add supplements and vitamins to help pt meet his estimated needs and to support wound healing. Per chart, pt down 5lbs(3%) over the past month; this is not significant.   Medications reviewed and include: aspirin, oscal w/ D, colace, pepcid, lasix, KCl, prednisone, psyllium, Vit D  Labs reviewed: Na 133(L), BUN 28(H) Hgb 9.2(L), Hct 28.2(L)  NUTRITION - FOCUSED PHYSICAL EXAM:  Flowsheet Row Most Recent Value  Orbital Region Moderate depletion  Upper Arm Region Severe depletion  Thoracic and Lumbar Region Severe depletion  Buccal Region Moderate depletion  Temple Region Moderate depletion  Clavicle Bone Region Severe depletion  Clavicle and Acromion Bone Region Severe depletion  Scapular Bone Region Severe depletion  Dorsal Hand Severe depletion  Patellar Region Severe depletion  Anterior Thigh Region Severe depletion  Posterior Calf Region Severe depletion  Edema (RD Assessment) None   Hair Reviewed  Eyes Reviewed  Mouth Reviewed  Skin Reviewed  Nails Reviewed   Diet Order:   Diet Order             Diet regular Room service appropriate? Yes; Fluid consistency: Thin  Diet effective now                  EDUCATION NEEDS:   No education needs have been identified at this time  Skin:  Skin Assessment: Reviewed RN Assessment (incision R groin), ecchymosis   Last BM:  7/12  Height:   Ht Readings from Last 1 Encounters:  08/09/20 5\' 8"  (1.727 m)    Weight:   Wt Readings from Last 1 Encounters:  08/09/20 63.6 kg    Ideal Body Weight:  70 kg  BMI:  Body mass index is 21.32 kg/m.  Estimated Nutritional Needs:   Kcal:  1700-1900kcal/day  Protein:  85-95g/day  Fluid:  1.6-1.9L/day  Koleen Distance MS, RD, LDN Please refer to [facility] for RD and/or RD on-call/weekend/after hours pager

## 2020-08-10 NOTE — Progress Notes (Signed)
PHARMACIST - PHYSICIAN COMMUNICATION  CONCERNING: IV to Oral Route Change Policy  RECOMMENDATION: This patient is receiving famotidine by the intravenous route.  Based on criteria approved by the Pharmacy and Therapeutics Committee, the intravenous medication(s) is/are being converted to the equivalent oral dose form(s).   DESCRIPTION: These criteria include: The patient is eating (either orally or via tube) and/or has been taking other orally administered medications for a least 24 hours The patient has no evidence of active gastrointestinal bleeding or impaired GI absorption (gastrectomy, short bowel, patient on TNA or NPO).  If you have questions about this conversion, please contact the [place], [facility] 08/10/2020 7:58 AM

## 2020-08-10 NOTE — Progress Notes (Signed)
[facility] Daily Progress Note  08/09/20: 1.   Right common femoral, profunda femoris, and superficial femoral artery endarterectomies and patch angioplasty 2.   Redo right femoral exploration after previous surgery  Subjective: Patient without complaint this AM with the exception of inability to sleep last night.  He notes a burning sensation to the right foot.  No acute issues overnight.  Objective: Vitals:   08/10/20 0400 08/10/20 0500 08/10/20 0600 08/10/20 0700  BP: 134/82 133/60 136/69 140/73  Pulse: 80 85 (!) 106 64  Resp: 19 15 (!) 21 19  Temp:      TempSrc:      SpO2: 93% 94% 92% 96%  Weight:      Height:        Intake/Output Summary (Last 24 hours) at 08/10/2020 1018 Last data filed at 08/10/2020 0600 Gross per 24 hour  Intake 488.31 ml  Output 930 ml  Net -441.69 ml   Physical Exam: A&Ox3, NAD CV: RRR Pulmonary: CTA Bilaterally Abdomen: Soft, Non-tender, Non-distended Foley: Removed Right Groin:  Incision: OR dressing intact, clean and dry.  Vascular:  Right Lower Extremity: Thigh soft.  Calf soft.  Extremities warm distally to toes.  Minimal edema.  There is no acute vascular compromise noted to extremity at this time.   Laboratory: CBC    Component Value Date/Time   WBC 9.3 08/10/2020 0105   HGB 9.2 (L) 08/10/2020 0105   HGB 11.2 (L) 01/31/2014 0506   HCT 28.2 (L) 08/10/2020 0105   HCT 34.0 (L) 01/31/2014 0506   PLT 230 08/10/2020 0105   PLT 283 01/31/2014 0506   BMET    Component Value Date/Time   NA 133 (L) 08/10/2020 0105   NA 140 02/02/2014 0356   K 4.4 08/10/2020 0105   K 3.7 02/02/2014 0356   CL 104 08/10/2020 0105   CL 104 02/02/2014 0356   CO2 24 08/10/2020 0105   CO2 29 02/02/2014 0356   GLUCOSE 181 (H) 08/10/2020 0105   GLUCOSE 160 (H) 02/02/2014 0356   BUN 28 (H) 08/10/2020 0105   BUN 33 (H) 02/02/2014 0356   CREATININE 0.89 08/10/2020 0105   CREATININE 0.86 02/02/2014 0356   CALCIUM 7.8 (L) 08/10/2020  0105   CALCIUM 8.6 10/10/2019 0305   GFRNONAA >60 08/10/2020 0105   GFRNONAA >60 02/02/2014 0356   GFRNONAA >60 06/25/2011 1325   GFRAA >60 10/15/2019 0634   GFRAA >60 02/02/2014 0356   GFRAA >60 06/25/2011 1325   Assessment/Planning: The patient is a 87 year old male who presented with rest pain and nonhealing ulceration to the right foot now status post right femoral endarterectomy - POD#1  1) Hbg stable. AM  CBC. 2) Normal Creatinine. AM BMP.  3) Replete sodium 4) Vitals stable. Foley d/c'd - TOV 5) PT / OT / Ambulation today  Discussed with Dr. Ellis Parents Mashelle Busick PA-C 08/10/2020 10:18 AM

## 2020-08-10 NOTE — Evaluation (Signed)
Occupational Therapy Evaluation Patient Details Name: [name] MRN: [id number] DOB: [birth year] Today's Date: 08/10/2020    History of Present Illness The patient is a 87 year old male who presented with rest pain and nonhealing ulceration to the right foot now status post right femoral endarterectomy - POD#1   Clinical Impression   Patient presenting with decreased I in self care, balance, functional mobility/transfers, endurance, and safety awareness. Patient reports living at home with daughter PTA. He ambulates with [facility] and performs self care tasks without assistance as well as preparing his food. Pt does have an aide that comes 1x/wk to help with shower. Daughter works during the day but checks in several times and has the ability to work from home some if needed.  Patient performed bed mobility with supervision and functional transfer to recliner chair with min guard and use of RW. He reports feeling much better once up in chair but requesting to rest before doing more with therapy.  Patient will benefit from acute OT to increase overall independence in the areas of ADLs, functional mobility, and safety awareness in order to safely discharge home with family.    Follow Up Recommendations  No OT follow up;Supervision - Intermittent    Equipment Recommendations  None recommended by OT       Precautions / Restrictions Precautions Precautions: Fall Restrictions Weight Bearing Restrictions: No      Mobility Bed Mobility Overal bed mobility: Needs Assistance Bed Mobility: Supine to Sit     Supine to sit: Supervision     General bed mobility comments: no physical assistance provided. Supervision for safety    Transfers Overall transfer level: Needs assistance Equipment used: Rolling walker (2 wheeled) Transfers: Sit to/from [facility] Sit to Stand: Min guard Stand pivot transfers: Min guard            Balance Overall balance assessment: Needs  assistance Sitting-balance support: Feet supported;No upper extremity supported Sitting balance-Leahy Scale: Good     Standing balance support: During functional activity Standing balance-Leahy Scale: Fair Standing balance comment: UE support                           ADL either performed or assessed with clinical judgement   ADL Overall ADL's : Needs assistance/impaired     Grooming: Wash/dry hands;Wash/dry face;Sitting;Set up                   Toilet Transfer: Min Fish farm manager Details (indicate cue type and reason): simulated         Functional mobility during ADLs: Min guard General ADL Comments: Pt required min guard for functional transfer this session with use of RW. OT anticipates pt can perform UB self care with set up A and LB self care with min guard.     Vision Patient Visual Report: No change from baseline              Pertinent Vitals/Pain Pain Assessment: No/denies pain     Hand Dominance Right   Extremity/Trunk Assessment Upper Extremity Assessment Upper Extremity Assessment: Overall WFL for tasks assessed   Lower Extremity Assessment Lower Extremity Assessment: Generalized weakness       Communication Communication Communication: No difficulties   Cognition Arousal/Alertness: Awake/alert Behavior During Therapy: Restless Overall Cognitive Status: Within Functional Limits for tasks assessed  General Comments: Pt is very pleasant and coopertative. A & O x4              Home Living Family/patient expects to be discharged to:: Private residence Living Arrangements: Children Available Help at Discharge: Family;Available PRN/intermittently Type of Home: House Home Access: Ramped entrance     Home Layout: Multi-level;Able to live on main level with bedroom/bathroom     Bathroom Shower/Tub: Walk-in shower   Bathroom Toilet: Handicapped height     Home  Equipment: Environmental consultant - 2 wheels;Cane - single point;Grab bars - toilet          Prior Functioning/Environment Level of Independence: Independent with assistive device(s)        Comments: Pt reports use of SPC for ambulation. He lives with daughter who works during the day but calls to check on him several times a day. She is able to work from home some. An aide assists with shower 1x/wk and he sponge baths the other days. He fixes his own meals.        OT Problem List: Decreased strength;Decreased activity tolerance;Impaired balance (sitting and/or standing);Decreased knowledge of use of DME or AE;Decreased safety awareness      OT Treatment/Interventions: Self-care/ADL training;Modalities;Balance training;Therapeutic exercise;Neuromuscular education;Therapeutic activities;Energy conservation;Cognitive remediation/compensation;DME and/or AE instruction;Patient/family education    OT Goals(Current goals can be found in the care plan section) Acute Rehab OT Goals Patient Stated Goal: to return home OT Goal Formulation: With patient Time For Goal Achievement: 08/24/20 Potential to Achieve Goals: Good ADL Goals Pt Will Perform Grooming: with modified independence;standing Pt Will Perform Lower Body Dressing: with modified independence Pt Will Transfer to Toilet: with modified independence Pt Will Perform Toileting - Clothing Manipulation and hygiene: with modified independence;sit to/from stand  OT Frequency: Min 2X/week   Barriers to D/C:    none known at this time          AM-PAC OT "6 Clicks" Daily Activity     Outcome Measure Help from another person eating meals?: None Help from another person taking care of personal grooming?: None Help from another person toileting, which includes using toliet, bedpan, or urinal?: A Little Help from another person bathing (including washing, rinsing, drying)?: A Little Help from another person to put on and taking off regular upper body  clothing?: None Help from another person to put on and taking off regular lower body clothing?: A Little 6 Click Score: 21   End of Session Equipment Utilized During Treatment: Rolling walker Nurse Communication: Mobility status  Activity Tolerance: Patient tolerated treatment well Patient left: in chair;with call bell/phone within reach;with chair alarm set;with nursing/sitter in room  OT Visit Diagnosis: Unsteadiness on feet (R26.81);Repeated falls (R29.6);Muscle weakness (generalized) (M62.81)                Time: 1308-6578 OT Time Calculation (min): 22 min Charges:  OT General Charges $OT Visit: 1 Visit OT Evaluation $OT Eval Low Complexity: 1 Low OT Treatments $Therapeutic Activity: 8-22 mins Darleen Crocker, MS, OTR/L , CBIS ascom [phone]  08/10/20, 1:36 PM

## 2020-08-10 NOTE — Evaluation (Signed)
Physical Therapy Evaluation Patient Details Name: [name] MRN: [id number] DOB: April 28, 1928 Today's Date: 08/10/2020   History of Present Illness  The patient is a 87 year old male who presented with rest pain and nonhealing ulceration to the right foot now status post right femoral endarterectomy - POD#1  Clinical Impression  Pt is a pleasant 87 year old male who was admitted for endarterectomy of R LE 08/09/20. Pt reports he is in too much pain with any movement and declines all mobility efforts at this time. Per evaluation, demonstrates adequate strength to perform mobility and is planning to perform mobility next date with therapist. Is very hopeful to dc home at time of discharge. Pt demonstrates deficits with pain/mobility. Will likely need to use AD for all mobility for acute recovery setting. Would benefit from skilled PT to address above deficits and promote optimal return to PLOF. Recommend transition to [place] upon discharge from acute hospitalization.     Follow Up Recommendations Home health PT;Supervision for mobility/OOB    Equipment Recommendations  None recommended by PT    Recommendations for Other Services       Precautions / Restrictions Precautions Precautions: Fall Restrictions Weight Bearing Restrictions: No      Mobility  Bed Mobility            General bed mobility comments: reports he has been up with supervision to EOB with RN staff and was in too much pain to tolerate further activity. Declining all OOB this session with PT    Transfers            General transfer comment: unable to tolerate-will assess next session  Ambulation/Gait                Stairs            Wheelchair Mobility    Modified Rankin (Stroke Patients Only)       Balance Overall balance assessment: Needs assistance Sitting-balance support: Feet supported;No upper extremity supported Sitting balance-Leahy Scale: Good     Standing balance  support: During functional activity Standing balance-Leahy Scale: Fair Standing balance comment: UE support                             Pertinent Vitals/Pain Pain Assessment: Faces Faces Pain Scale: Hurts whole lot Pain Location: R LE with any movement Pain Descriptors / Indicators: Aching;Discomfort Pain Intervention(s): Limited activity within patient's tolerance    Home Living Family/patient expects to be discharged to:: Private residence Living Arrangements: Children Available Help at Discharge:  (per patient, daughter will be able to stay 24/7 for recovery time period) Type of Home: House Home Access: Ramped entrance     Home Layout: Multi-level (has basement, however stays on main level.) Home Equipment: Walker - 2 wheels;Cane - single point;Grab bars - toilet;Bedside commode      Prior Function Level of Independence: Independent with assistive device(s)         Comments: Pt reports use of SPC for ambulation outside of home. Typically doesn't use AD in home. Reports no recent falls. He lives with daughter who works during the day but calls to check on him several times a day. She is able to work from home some. An aide assists with shower 1x/wk and he sponge baths the other days. He fixes his own meals.     Hand Dominance   Dominant Hand: Right    Extremity/Trunk Assessment   Upper Extremity Assessment  Upper Extremity Assessment: Overall WFL for tasks assessed    Lower Extremity Assessment Lower Extremity Assessment: Generalized weakness (R LE grossly 4/5 and painful; L LE groslsy 5/5)       Communication   Communication: No difficulties  Cognition Arousal/Alertness: Awake/alert Behavior During Therapy: WFL for tasks assessed/performed Overall Cognitive Status: Within Functional Limits for tasks assessed                                 General Comments: very scared for all mobility, due to increased pain at this time       General Comments      Exercises Other Exercises Other Exercises: B LE ther-ex performed including AP, quad sets, SAQ, hip abd/add, and SLRs. 10 reps performed with supervision. Safe technique   Assessment/Plan    PT Assessment Patient needs continued PT services  PT Problem List Decreased strength;Decreased balance;Decreased mobility;Pain       PT Treatment Interventions Gait training;Therapeutic exercise;DME instruction    PT Goals (Current goals can be found in the Care Plan section)  Acute Rehab PT Goals Patient Stated Goal: to return home PT Goal Formulation: With patient Time For Goal Achievement: 08/24/20 Potential to Achieve Goals: Good    Frequency Min 2X/week   Barriers to discharge        Co-evaluation               AM-PAC PT "6 Clicks" Mobility  Outcome Measure Help needed turning from your back to your side while in a flat bed without using bedrails?: A Little Help needed moving from lying on your back to sitting on the side of a flat bed without using bedrails?: A Little Help needed moving to and from a bed to a chair (including a wheelchair)?: A Little Help needed standing up from a chair using your arms (e.g., wheelchair or bedside chair)?: A Little Help needed to walk in hospital room?: A Little Help needed climbing 3-5 steps with a railing? : A Little 6 Click Score: 18    End of Session   Activity Tolerance: Patient limited by pain Patient left: in bed Nurse Communication: Mobility status (pt agreeable to get OOB next date) PT Visit Diagnosis: Muscle weakness (generalized) (M62.81);Difficulty in walking, not elsewhere classified (R26.2);Pain Pain - Right/Left: Right Pain - part of body: Leg    Time: 9371-6967 PT Time Calculation (min) (ACUTE ONLY): 13 min   Charges:   PT Evaluation $PT Eval Low Complexity: 1 Low          Greggory Stallion, PT, DPT [phone]   Doron Shake 08/10/2020, 2:54 PM

## 2020-08-11 ENCOUNTER — Inpatient Hospital Stay: Payer: Medicare Other

## 2020-08-11 DIAGNOSIS — J9811 Atelectasis: Secondary | ICD-10-CM | POA: Diagnosis not present

## 2020-08-11 LAB — BASIC METABOLIC PANEL
Anion gap: 4 — ABNORMAL LOW (ref 5–15)
BUN: 31 mg/dL — ABNORMAL HIGH (ref 8–23)
CO2: 29 mmol/L (ref 22–32)
Calcium: 9.2 mg/dL (ref 8.9–10.3)
Chloride: 104 mmol/L (ref 98–111)
Creatinine, Ser: 0.94 mg/dL (ref 0.61–1.24)
GFR, Estimated: >60 mL/min (ref >60)
Glucose, Bld: 101 mg/dL — ABNORMAL HIGH (ref 70–99)
Potassium: 4.7 mmol/L (ref 3.5–5.1)
Sodium: 137 mmol/L (ref 135–145)

## 2020-08-11 LAB — CBC
HCT: 30.3 % — ABNORMAL LOW (ref 39.0–52.0)
Hemoglobin: 9.6 g/dL — ABNORMAL LOW (ref 13.0–17.0)
MCH: 28.5 pg (ref 26.0–34.0)
MCHC: 31.7 g/dL (ref 30.0–36.0)
MCV: 89.9 fL (ref 80.0–100.0)
Platelets: 290 10*3/uL (ref 150–400)
RBC: 3.37 MIL/uL — ABNORMAL LOW (ref 4.22–5.81)
RDW: 15 % (ref 11.5–15.5)
WBC: 9.7 10*3/uL (ref 4.0–10.5)
nRBC: 0 % (ref 0.0–0.2)

## 2020-08-11 LAB — SURGICAL PATHOLOGY

## 2020-08-11 LAB — MAGNESIUM: Magnesium: 2 mg/dL (ref 1.7–2.4)

## 2020-08-11 MED ORDER — ASCORBIC ACID 250 MG PO TABS: 250.0000 mg | ORAL_TABLET | Freq: Two times a day (BID) | ORAL | 6 refills | Status: DC

## 2020-08-11 MED ORDER — ATORVASTATIN CALCIUM 20 MG PO TABS: 20.0000 mg | ORAL_TABLET | Freq: Every day | ORAL | 3 refills | Status: DC

## 2020-08-11 MED ORDER — ENSURE ENLIVE PO LIQD: 237.0000 mL | Freq: Two times a day (BID) | ORAL | 12 refills | Status: DC

## 2020-08-11 MED ORDER — ADULT MULTIVITAMIN W/MINERALS CH: 1.0000 | ORAL_TABLET | Freq: Every day | ORAL | 3 refills | Status: DC

## 2020-08-11 MED ORDER — OXYCODONE-ACETAMINOPHEN 5-325 MG PO TABS: 1.0000 | ORAL_TABLET | Freq: Three times a day (TID) | ORAL | 0 refills | Status: DC | PRN

## 2020-08-11 MED ORDER — ASPIRIN 81 MG PO TBEC: 81.0000 mg | DELAYED_RELEASE_TABLET | Freq: Every day | ORAL | 3 refills | Status: DC

## 2020-08-11 MED ORDER — OXYCODONE-ACETAMINOPHEN 5-325 MG PO TABS: 1.0000 | ORAL_TABLET | Freq: Three times a day (TID) | ORAL | Status: DC | PRN

## 2020-08-11 NOTE — Discharge Instructions (Signed)
Vascular Surgery Discharge Instructions: 1) You may remove your dressing and shower as of Sunday. 2) Gently clean your incision with soap and water. Gently pat dry. 3) If you notice any drainage from the incision it is okay to place a gauze to the area with tape.  Please try to keep the area dry. 4) Please do not engage in strenuous activity or lifting greater than 10 pounds until you are cleared at one of your postoperative visits. 5) Please do not drive until you are cleared at 1 your post operative visits.

## 2020-08-11 NOTE — Care Management Important Message (Signed)
Important Message  Patient Details  Name: [name] MRN: [id number] Date of Birth: 1928-02-22   Medicare Important Message Given:  N/A - LOS <3 / Initial given by admissions     [name] 08/11/2020, 10:25 AM

## 2020-08-11 NOTE — Progress Notes (Signed)
Pt reports using 3L O2 at home. Currently pt is on 4L at rest with O2 in the low 90's. With ambulation, O2 drops into the mid-low 80's requiring 6L. MD and CM notified.

## 2020-08-11 NOTE — Progress Notes (Signed)
Physical Therapy Treatment Patient Details Name: [name] MRN: [id number] DOB: [birth year] Today's Date: 08/11/2020    History of Present Illness The patient is a 87 year old male who presented with rest pain and nonhealing ulceration to the right foot now status post right femoral endarterectomy - POD#1    PT Comments    Pt seen for PT tx with daughter [name]) present for session. Pt is able to complete sit<>stand with supervision & ambulate into hall & bathroom with RW & supervision. Pt with continent void but requires assistance to change clothing as pt with poor aim into toilet. Pt is on 4-6L/min via nasal cannula during session with nurse present & observing O2 readings; O2 fluctuated as low as 60s but poor pleth waveform & nurse does not feel like O2 reading is accurate. Pt left on 4L/min 93% at end of session. Pt's daughter voicing concerns & asking many questions about O2 needs with PT directing her to speak to MD - team made aware.     Follow Up Recommendations  Home health PT;Supervision for mobility/OOB     Equipment Recommendations  3in1 (PT) (per pt & daughter pt has RW & w/c at home)    Recommendations for Other Services       Precautions / Restrictions Precautions Precautions: Fall Restrictions Weight Bearing Restrictions: No    Mobility  Bed Mobility               General bed mobility comments: not observed as pt received & left sitting in recliner    Transfers Overall transfer level: Needs assistance Equipment used: Rolling walker (2 wheeled) Transfers: Sit to/from Stand Sit to Stand: Supervision         General transfer comment: extra time to power up to standing (daughter reports pt has a lift chair at home)  Ambulation/Gait Ambulation/Gait assistance: Supervision Gait Distance (Feet): 40 Feet (+12) Assistive device: Rolling walker (2 wheeled) Gait Pattern/deviations: Decreased stride length Gait velocity: decreased        Stairs             Wheelchair Mobility    Modified Rankin (Stroke Patients Only)       Balance Overall balance assessment: Needs assistance Sitting-balance support: Feet supported;No upper extremity supported Sitting balance-Leahy Scale: Good     Standing balance support: During functional activity;Bilateral upper extremity supported Standing balance-Leahy Scale: Fair                              Cognition Arousal/Alertness: Awake/alert Behavior During Therapy: WFL for tasks assessed/performed;Flat affect Overall Cognitive Status: Within Functional Limits for tasks assessed                                        Exercises      General Comments General comments (skin integrity, edema, etc.): Pt with continent void on toilet but poor aim & floor got wet, PT assisted pt with changing into clean gown      Pertinent Vitals/Pain Pain Assessment: No/denies pain    Home Living                      Prior Function            PT Goals (current goals can now be found in the care plan section) Acute Rehab PT Goals Patient Stated  Goal: to return home PT Goal Formulation: With patient Time For Goal Achievement: 08/24/20 Potential to Achieve Goals: Good Progress towards PT goals: Progressing toward goals    Frequency    Min 2X/week      PT Plan Current plan remains appropriate    Co-evaluation              AM-PAC PT "6 Clicks" Mobility   Outcome Measure  Help needed turning from your back to your side while in a flat bed without using bedrails?: None Help needed moving from lying on your back to sitting on the side of a flat bed without using bedrails?: A Little Help needed moving to and from a bed to a chair (including a wheelchair)?: A Little Help needed standing up from a chair using your arms (e.g., wheelchair or bedside chair)?: A Little Help needed to walk in hospital room?: A Little Help needed  climbing 3-5 steps with a railing? : A Little 6 Click Score: 19    End of Session Equipment Utilized During Treatment: Gait belt;Oxygen Activity Tolerance: Patient tolerated treatment well Patient left: in chair;with family/visitor present;with bed alarm set Nurse Communication: Mobility status (O2) PT Visit Diagnosis: Muscle weakness (generalized) (M62.81);Difficulty in walking, not elsewhere classified (R26.2);Pain     Time: 1222-1255 PT Time Calculation (min) (ACUTE ONLY): 33 min  Charges:  $Therapeutic Activity: 23-37 mins                     Lavone Nian, PT, DPT 08/11/20, 1:14 PM    Waunita Schooner 08/11/2020, 1:11 PM

## 2020-08-11 NOTE — Progress Notes (Signed)
Occupational Therapy Treatment Patient Details Name: [name] MRN: [id number] DOB: [birth year] Today's Date: 08/11/2020    History of present illness The patient is a 87 year old male who presented with rest pain and nonhealing ulceration to the right foot now status post right femoral endarterectomy - POD#1   OT comments  Pt seen for OT tx this date to f/u re: saety with ADLs/ADL mobility. Pt received in bed and agreeable to getting up as he would like to try to get to the restroom and have BM. Pt requires SUPV for sup to sit and SUPV to come to stand with RW. Pt requires SBA to complete fxl mobility to restroom. He requires extended time seated on commode, not only to attempt BM, but also to recover O2 sats, dropped to ~80-82% on 6Lnc. Turned up to 8L, he requires ~1 min to get to ~86% spO2. Requires ultimately ~1.75mins and 10L to recover to ~89-92%. Pt completes seated UB bathing with SETUP and requires MIN A/CGA for LB dressing mostly d/t need for energy conservation. After extended rest time and PLB cueing, pt requires SUPV/SBA with RW to complete fxl mobility back to recliner on opposite side of room. Will continue to follow acutely.    Follow Up Recommendations  No OT follow up;Supervision - Intermittent    Equipment Recommendations  None recommended by OT    Recommendations for Other Services      Precautions / Restrictions Precautions Precautions: Fall Restrictions Weight Bearing Restrictions: No       Mobility Bed Mobility Overal bed mobility: Needs Assistance Bed Mobility: Supine to Sit     Supine to sit: Supervision;HOB elevated     General bed mobility comments: increased time    Transfers Overall transfer level: Needs assistance Equipment used: Rolling walker (2 wheeled) Transfers: Sit to/from Stand Sit to Stand: Supervision         General transfer comment: increased time, good control, slightly unsteady, but only requires SBA.    Balance  Overall balance assessment: Needs assistance Sitting-balance support: Feet supported;No upper extremity supported Sitting balance-Leahy Scale: Good     Standing balance support: During functional activity;Bilateral upper extremity supported Standing balance-Leahy Scale: Fair Standing balance comment: UE support                           ADL either performed or assessed with clinical judgement   ADL Overall ADL's : Needs assistance/impaired     Grooming: Wash/dry hands;Wash/dry face;Set up;Standing;Supervision/safety Grooming Details (indicate cue type and reason): RW for UE support, only tolerates standing ~1-1.5 min Upper Body Bathing: Set up;Sitting   Lower Body Bathing: Minimal assistance;Moderate assistance;Sitting/lateral leans   Upper Body Dressing : Set up;Sitting   Lower Body Dressing: Minimal assistance;Moderate assistance;Sitting/lateral leans Lower Body Dressing Details (indicate cue type and reason): to don clean socks Toilet Transfer: Supervision/safety;Ambulation;RW;BSC;Grab bars Toilet Transfer Details (indicate cue type and reason): OT placed BSC over standard toilet in restroom to elevate. Toileting- Clothing Manipulation and Hygiene: Supervision/safety;Min guard;Sit to/from stand Toileting - Clothing Manipulation Details (indicate cue type and reason): use of grab bars, CGA for balance d/t more dynamic task     Functional mobility during ADLs: Supervision/safety (cues for safe use of RW, completes to one standing rest break to complete fxl mobility to chair on opposite side of room.)       Vision Patient Visual Report: No change from baseline     Perception     Praxis  Cognition Arousal/Alertness: Awake/alert Behavior During Therapy: WFL for tasks assessed/performed Overall Cognitive Status: Within Functional Limits for tasks assessed                                 General Comments: appropraite, follows commands well,  oriented this date.        Exercises Other Exercises Other Exercises: OT engaged pt in bathing, and dressing tasks in restroom as well as toileting and grooming. O2 monitored throughout. Pt up to chair at end of session and RN notified.   Shoulder Instructions       General Comments Pt with O2 de-saturation to ~80-82% on 6Lnc with fxl mobility to restroom, 1 min on 8L, pt recovers to 86%, then after 30 more seconds on 10L, recovers to 89-92%. Pt returned to 6L when resting seated in the chair. Sats >89%    Pertinent Vitals/ Pain       Pain Assessment: No/denies pain Faces Pain Scale: Hurts little more Pain Location: R LE Pain Descriptors / Indicators: Aching;Discomfort Pain Intervention(s): Limited activity within patient's tolerance  Home Living                                          Prior Functioning/Environment              Frequency  Min 2X/week        Progress Toward Goals  OT Goals(current goals can now be found in the care plan section)  Progress towards OT goals: Progressing toward goals  Acute Rehab OT Goals Patient Stated Goal: to return home OT Goal Formulation: With patient Time For Goal Achievement: 08/24/20 Potential to Achieve Goals: Good  Plan Discharge plan remains appropriate    Co-evaluation                 AM-PAC OT "6 Clicks" Daily Activity     Outcome Measure   Help from another person eating meals?: None Help from another person taking care of personal grooming?: None Help from another person toileting, which includes using toliet, bedpan, or urinal?: A Little Help from another person bathing (including washing, rinsing, drying)?: A Little Help from another person to put on and taking off regular upper body clothing?: None Help from another person to put on and taking off regular lower body clothing?: A Little 6 Click Score: 21    End of Session Equipment Utilized During Treatment: Rolling  walker;Oxygen  OT Visit Diagnosis: Unsteadiness on feet (R26.81);Repeated falls (R29.6);Muscle weakness (generalized) (M62.81)   Activity Tolerance Patient tolerated treatment well   Patient Left in chair;with call bell/phone within reach;with chair alarm set;with nursing/sitter in room   Nurse Communication Mobility status;Other (comment) (o2)        Time: 1610-9604 OT Time Calculation (min): 41 min  Charges: OT General Charges $OT Visit: 1 Visit OT Treatments $Self Care/Home Management : 23-37 mins $Therapeutic Activity: 8-22 mins  Gerrianne Scale, MS, OTR/L ascom [phone] 08/11/20, 2:00 PM

## 2020-08-11 NOTE — Plan of Care (Signed)
Pt states that he is seeing things strangely in his room such as everything on the wall looks as though it is on the floor, even this nurse. I spoke with daughter on the phone and daughter was concerned. I explained that he may have some hospital delirium and this can be normal with older patients. Pt encourage to get some sleep and that may help. MD aware and encourages the same. Problem: Education: Goal: Knowledge of General Education information will improve Description: Including pain rating scale, medication(s)/side effects and non-pharmacologic comfort measures Outcome: Progressing   Problem: Health Behavior/Discharge Planning: Goal: Ability to manage health-related needs will improve Outcome: Progressing   Problem: Clinical Measurements: Goal: Ability to maintain clinical measurements within normal limits will improve Outcome: Progressing Goal: Will remain free from infection Outcome: Progressing Goal: Diagnostic test results will improve Outcome: Progressing Goal: Respiratory complications will improve Outcome: Progressing Goal: Cardiovascular complication will be avoided Outcome: Progressing   Problem: Activity: Goal: Risk for activity intolerance will decrease Outcome: Progressing   Problem: Nutrition: Goal: Adequate nutrition will be maintained Outcome: Progressing   Problem: Coping: Goal: Level of anxiety will decrease Outcome: Progressing   Problem: Elimination: Goal: Will not experience complications related to bowel motility Outcome: Progressing Goal: Will not experience complications related to urinary retention Outcome: Progressing   Problem: Pain Managment: Goal: General experience of comfort will improve Outcome: Progressing   Problem: Safety: Goal: Ability to remain free from injury will improve Outcome: Progressing   Problem: Skin Integrity: Goal: Risk for impaired skin integrity will decrease Outcome: Progressing

## 2020-08-11 NOTE — TOC Progression Note (Signed)
Transition of Care [facility]) - Progression Note    Patient Details  Name: [name] MRN: [id number] Date of Birth: 12-27-1928  Transition of Care [facility]) CM/SW Portia, RN Phone Number: 08/11/2020, 3:11 PM  Clinical Narrative:      Had a lengthy discussion with the patient and his daughter about COPD and options for home, She stated that he is followed at home by [facility], we discussed Hospice, she stated that Palliative has recommended Hospice months ago but she has been putting it off, she has an appointment with the Palliative Nurse soon, she will have the discussion again, I encouraged her to ask the Pulmonologist at the appointment questions around prognosis and what to expect, Comfort care verses treatment.  I encouraged her to write down all of her questions prior to the appointment  and review with the physician,  The patient has home oxygen and I reviewed with him pursed lip breathing and stopping what his activity is when he gets SOB and resting to get his breath again, He agrees to use the rolling walker at home, To aid in stability.  The patient feels that he does not need home health PT but agrees to being set up I contacted jason at [facility] and they will not be able to accept the patient, I spoke with Tommi Rumps at [facility] and they are not able to accept the patient, I spoke with Stacie at [facility] and they are not able to accept the patient, I spoke with [facility] and they are not able to accept the patient His daughter is agreeable to the patient Discharge today. I explained the IM and right to appeal if she chooses. She stated that she wouldn't want to appeal Reviewed Pursed lip breathing and how to do it. She is going to speak to the  patient's doctors and the palliative Nurse to review the options of Hospice     Expected Discharge Plan: Home/Self Care Barriers to Discharge: Barriers Resolved  Expected Discharge Plan and  Services Expected Discharge Plan: Home/Self Care   Discharge Planning Services: CM Consult   Living arrangements for the past 2 months: Single Family Home Expected Discharge Date: 08/11/20               DME Arranged: N/A         HH Arranged: Patient Refused HH           Social Determinants of Health (SDOH) Interventions    Readmission Risk Interventions Readmission Risk Prevention Plan 10/10/2019 01/31/2019  Transportation Screening Complete Complete  PCP or Specialist Appt within 3-5 Days Complete -  HRI or Home Care Consult Complete -  Social Work Consult for [place] Planning/Counseling Complete -  Palliative Care Screening Complete -  Medication Review [employer]) Complete -  Some recent data might be hidden

## 2020-08-11 NOTE — Plan of Care (Signed)

## 2020-08-11 NOTE — Plan of Care (Signed)
  Problem: Education: Goal: Knowledge of General Education information will improve Description: Including pain rating scale, medication(s)/side effects and non-pharmacologic comfort measures 08/11/2020 1546 by Jules Schick, RN Outcome: Completed/Met 08/11/2020 0825 by Jules Schick, RN Outcome: Progressing   Problem: Health Behavior/Discharge Planning: Goal: Ability to manage health-related needs will improve 08/11/2020 1546 by Jules Schick, RN Outcome: Completed/Met 08/11/2020 0825 by Jules Schick, RN Outcome: Progressing   Problem: Clinical Measurements: Goal: Ability to maintain clinical measurements within normal limits will improve 08/11/2020 1546 by Jules Schick, RN Outcome: Completed/Met 08/11/2020 0825 by Jules Schick, RN Outcome: Progressing Goal: Will remain free from infection 08/11/2020 1546 by Jules Schick, RN Outcome: Completed/Met 08/11/2020 0825 by Jules Schick, RN Outcome: Progressing Goal: Diagnostic test results will improve 08/11/2020 1546 by Jules Schick, RN Outcome: Completed/Met 08/11/2020 0825 by Jules Schick, RN Outcome: Progressing Goal: Respiratory complications will improve 08/11/2020 1546 by Jules Schick, RN Outcome: Completed/Met 08/11/2020 0825 by Jules Schick, RN Outcome: Progressing Goal: Cardiovascular complication will be avoided 08/11/2020 1546 by Jules Schick, RN Outcome: Completed/Met 08/11/2020 0825 by Jules Schick, RN Outcome: Progressing   Problem: Activity: Goal: Risk for activity intolerance will decrease 08/11/2020 1546 by Jules Schick, RN Outcome: Completed/Met 08/11/2020 0825 by Jules Schick, RN Outcome: Progressing   Problem: Nutrition: Goal: Adequate nutrition will be maintained 08/11/2020 1546 by Jules Schick, RN Outcome: Completed/Met 08/11/2020 0825 by Jules Schick, RN Outcome: Progressing   Problem: Coping: Goal: Level of anxiety will  decrease 08/11/2020 1546 by Jules Schick, RN Outcome: Completed/Met 08/11/2020 0825 by Jules Schick, RN Outcome: Progressing   Problem: Pain Managment: Goal: General experience of comfort will improve 08/11/2020 1546 by Jules Schick, RN Outcome: Completed/Met 08/11/2020 0825 by Jules Schick, RN Outcome: Progressing   Problem: Safety: Goal: Ability to remain free from injury will improve 08/11/2020 1546 by Jules Schick, RN Outcome: Completed/Met 08/11/2020 0825 by Jules Schick, RN Outcome: Progressing   Problem: Skin Integrity: Goal: Risk for impaired skin integrity will decrease 08/11/2020 1546 by Jules Schick, RN Outcome: Completed/Met 08/11/2020 0825 by Jules Schick, RN Outcome: Progressing

## 2020-08-11 NOTE — Progress Notes (Signed)
Met with the patient to discuss DC plan and needs He has DME at home including RW and oxygen He stated that his daughter provides him with transportation and care, she does his bill paying and shopping and anything else he needs He has had [place] services in the past, he has an aide that comes once a week to help bathe him, He does not want [place] services set up and stated that he will do fine at home,  He plans to DC home today He stated that he has walked to the bathroom with a walker this morning

## 2020-08-11 NOTE — Progress Notes (Signed)
Pt discharged home with daughter, all instructions provided with questions answered.  Pt to follow up with pulmonology and palliative care outpatient.   BP 140/77 (BP Location: Left Arm)   Pulse 77   Temp 98.7 F (37.1 C)   Resp 18   Ht 5\' 8"  (1.727 m)   Wt 63.6 kg   SpO2 92%   BMI 21.32 kg/m

## 2020-08-11 NOTE — Discharge Summary (Signed)
[facility]    Discharge Summary  Patient ID:  [name] MRN: [id number] DOB/AGE: May 04, 1928 87 y.o.  Admit date: 08/09/2020 Discharge date: 08/11/2020 Date of Surgery: 08/09/2020 Surgeon: Surgeon(s): Dew, Erskine Squibb, MD Schnier, Dolores Lory, MD  Admission Diagnosis: Atherosclerosis of artery of extremity with ulceration ([place]) [I70.299, L97.909]  Discharge Diagnoses:  Atherosclerosis of artery of extremity with ulceration ([place]) [I70.299, L97.909]  Secondary Diagnoses: Past Medical History:  Diagnosis Date   (HFpEF) heart failure with preserved ejection fraction (HCC)    Aortic atherosclerosis (HCC)    Cardiac murmur    Grade I/VI medium pitched mid systolic blowing at lower LSB   COPD (chronic obstructive pulmonary disease) (HCC)    Coronary artery disease    Dependence on supplemental oxygen    HLD (hyperlipidemia)    Hypertension    Paroxysmal atrial fibrillation (HCC)    PVD (peripheral vascular disease) (HCC)    Skin cancer    nose and ear   Thoracic aortic ectasia (HCC)    a.) measured 3.6 x 3.3 cm on 10/08/2019   Procedure(s): 08/09/20: 1.   Right common femoral, profunda femoris, and superficial femoral artery endarterectomies and patch angioplasty 2.   Redo right femoral exploration after previous surgery  Discharged Condition: good  HPI / Hospital Course:  Patient presents with rest pain and nonhealing ulceration on the right foot.  He has a previous aortobifemoral bypass and the limb was actually open, but the common femoral artery below the limb and the proximal portions of the profunda femoris artery as well as the proximal and entire superficial femoral artery were occluded.  Right femoral endarterectomy is planned to try to improve perfusion.  The risks and benefits as well as alternative therapies including intervention were reviewed in detail all questions were answered the patient agrees to proceed with surgery. On 08/09/20, the  patient underwent:  1.   Right common femoral, profunda femoris, and superficial femoral artery endarterectomies and patch angioplasty 2.   Redo right femoral exploration after previous surgery  He tolerated the procedure well was transferred from the operating room to the ICU for observation overnight.  The patient's site of surgery was unremarkable.  Postop day #1 the patient received occupational physical therapy services with recommended discharge home with home PT.  During his inpatient stay, the patient's diet was advanced, his Foley was removed and he was urinating independently, his pain was controlled to the use of p.o. pain medication and he was ambulating essentially at baseline.  Day of discharge, the patient was afebrile with stable vital signs and a improved physical exam.  Physical Exam:  A&Ox3, NAD CV: RRR Pulmonary: CTA Bilaterally Abdomen: Soft, Non-tender, Non-distended Foley: Removed Right Groin:             Incision: Intact, clean and dry. Vascular:             Right Lower Extremity: Thigh soft.  Calf soft.  Extremities warm distally to toes.  Minimal edema.  There is no acute vascular compromise noted to extremity at this time.  Labs: As below  Complications: None  Consults:  Physical therapy Occupational Therapy Nutrition  Significant Diagnostic Studies: CBC Lab Results  Component Value Date   WBC 9.7 08/11/2020   HGB 9.6 (L) 08/11/2020   HCT 30.3 (L) 08/11/2020   MCV 89.9 08/11/2020   PLT 290 08/11/2020   BMET    Component Value Date/Time   NA 137 08/11/2020 0527   NA  140 02/02/2014 0356   K 4.7 08/11/2020 0527   K 3.7 02/02/2014 0356   CL 104 08/11/2020 0527   CL 104 02/02/2014 0356   CO2 29 08/11/2020 0527   CO2 29 02/02/2014 0356   GLUCOSE 101 (H) 08/11/2020 0527   GLUCOSE 160 (H) 02/02/2014 0356   BUN 31 (H) 08/11/2020 0527   BUN 33 (H) 02/02/2014 0356   CREATININE 0.94 08/11/2020 0527   CREATININE 0.86 02/02/2014 0356   CALCIUM 9.2  08/11/2020 0527   CALCIUM 8.6 10/10/2019 0305   GFRNONAA >60 08/11/2020 0527   GFRNONAA >60 02/02/2014 0356   GFRNONAA >60 06/25/2011 1325   GFRAA >60 10/15/2019 0634   GFRAA >60 02/02/2014 0356   GFRAA >60 06/25/2011 1325   COAG Lab Results  Component Value Date   INR 1.4 (H) 10/15/2019   INR 1.7 (H) 10/14/2019   INR 2.5 (H) 10/13/2019   Disposition:  Discharge to :Home  Allergies as of 08/11/2020   No Known Allergies      Medication List     STOP taking these medications    amoxicillin-clavulanate 875-125 MG tablet Commonly known as: AUGMENTIN   fexofenadine 180 MG tablet Commonly known as: ALLEGRA       TAKE these medications    Acetaminophen 500 MG capsule Take by mouth.   albuterol (2.5 MG/3ML) 0.083% nebulizer solution Commonly known as: PROVENTIL Take 3 mLs (2.5 mg total) by nebulization every 6 (six) hours as needed for wheezing or shortness of breath. What changed: Another medication with the same name was removed. Continue taking this medication, and follow the directions you see here.   alendronate 70 MG tablet Commonly known as: FOSAMAX Take 70 mg by mouth once a week. Sunday morning with full glass of water and sit up do not lay down   apixaban 2.5 MG Tabs tablet Commonly known as: ELIQUIS Take 2.5 mg by mouth 2 (two) times daily.   ascorbic acid 250 MG tablet Commonly known as: VITAMIN C Take 1 tablet (250 mg total) by mouth 2 (two) times daily.   aspirin 81 MG EC tablet Take 1 tablet (81 mg total) by mouth daily at 6 (six) AM. Swallow whole.   atorvastatin 20 MG tablet Commonly known as: LIPITOR Take 1 tablet (20 mg total) by mouth daily.   BENEFIBER PO Take by mouth. 2 teaspoons twice a day   CALCIUM 600/VITAMIN D PO Take by mouth.   Cartia XT 300 MG 24 hr capsule Generic drug: diltiazem Take 300 mg by mouth daily.   diltiazem 300 MG 24 hr capsule Commonly known as: TIAZAC Take by mouth.   feeding supplement Liqd Take  237 mLs by mouth 2 (two) times daily between meals.   metoprolol succinate 25 MG 24 hr tablet Commonly known as: TOPROL-XL Take 1 tablet by mouth daily.   metoprolol tartrate 50 MG tablet Commonly known as: LOPRESSOR Take 50 mg by mouth 2 (two) times daily.   multivitamin with minerals Tabs tablet Take 1 tablet by mouth daily. Start taking on: August 12, 2020   mupirocin ointment 2 % Commonly known as: BACTROBAN 1 application daily.   oxyCODONE-acetaminophen 5-325 MG tablet Commonly known as: PERCOCET/ROXICET Take 1 tablet by mouth every 8 (eight) hours as needed for moderate pain.   predniSONE 10 MG tablet Commonly known as: DELTASONE Take 5 mg by mouth daily with breakfast.   senna-docusate 8.6-50 MG tablet Commonly known as: Senokot-S Take 1 tablet by mouth daily.   traZODone  50 MG tablet Commonly known as: DESYREL Take 50 mg by mouth at bedtime as needed for sleep.   Trelegy Ellipta 100-62.5-25 MCG/INH Aepb Generic drug: Fluticasone-Umeclidin-Vilant Inhale 1 puff into the lungs daily.   Trelegy Ellipta 100-62.5-25 MCG/INH Aepb Generic drug: Fluticasone-Umeclidin-Vilant Inhale into the lungs.   Vitamin D (Ergocalciferol) 1.25 MG (50000 UNIT) Caps capsule Commonly known as: DRISDOL Take 1 capsule (50,000 Units total) by mouth every 7 (seven) days.       ASK your doctor about these medications    furosemide 20 MG tablet Commonly known as: LASIX TAKE 1 TABLET BY MOUTH DAILY   potassium chloride 10 MEQ tablet Commonly known as: KLOR-CON Take 2 tablets (20 mEq total) by mouth daily.       Verbal and written Discharge instructions given to the patient. Wound care per Discharge AVS  Follow-up Information     Dew, Erskine Squibb, MD Follow up in 2 week(s).   Specialties: Vascular Surgery, Radiology, Interventional Cardiology Why: Can see Arna Medici.  First surgical postop visit.  No studies.  Incision check. Contact information: [place]  [address] [phone]                Signed: Sela Hua, PA-C  08/11/2020, 10:28 AM

## 2020-08-16 ENCOUNTER — Telehealth (INDEPENDENT_AMBULATORY_CARE_PROVIDER_SITE_OTHER): Payer: Self-pay

## 2020-08-16 NOTE — Telephone Encounter (Signed)
Patient daughter left a voicemail stating that her father had surgery on 08/09/20 and was concern with right leg swelling. The patient is swollen from the groin down to the foot and having tightness from the knee down. Per Carroll Sage PA the swelling is to be expected and the patient should elevate to help with the swelling. The patient appointment will be schedule per discharge. Patient daughter was made aware with medical recommendations

## 2020-08-22 ENCOUNTER — Telehealth: Payer: Self-pay

## 2020-08-22 NOTE — Telephone Encounter (Signed)
Pt's daughter called the HF clinic with a question about whether to increase lasix for patient's right leg swelling. Pt had a recent vascular procedure on right leg and the swelling started after the procedure. Pt's daughter states that Dr. Bunnie Domino office told her this swelling was normal. Pt's daughter stated the pt has no other symptoms to the leg or anywhere else except for swelling. Pt denies any swelling elsewhere and denies any worsening s/s of heart failure. Spoke with [employer]. Pt advised to call vascular office again if the swelling worsened or if any new symptoms begin. Agreed with patient's prior plan to have Kristi from the paramedic program come out to assess the leg.

## 2020-08-24 ENCOUNTER — Encounter (HOSPITAL_COMMUNITY): Payer: Self-pay

## 2020-08-24 ENCOUNTER — Other Ambulatory Visit (HOSPITAL_COMMUNITY): Payer: Self-pay

## 2020-08-24 NOTE — Progress Notes (Signed)
Today had a home visit with [name].  He look good today. Breathing much better.  Has some swelling in his r leg from his surgery. He states not as swollen as it was.  Incision looks good, no redness or oozing.  He states getting around better and legs are feeling stronger.  He is not using his walker or cane in the home.  He has started doing some light things in the home, such as cooking and washing dishes.  He states been trying to do some exercises.  He has cut his oxygen down to 3 lpm.  Sats are running good.  He walk through the home without getting short of breath now, prior to surgery had to stop and rest.  He has vascular and ortho appt next week for check up.  He denies any chest pain, headaches or dizziness.  He has daughter fro support, she stays at night.  Will continue to visit for heart failure, diet and medication compliance.   [place] [phone]

## 2020-08-28 ENCOUNTER — Ambulatory Visit (INDEPENDENT_AMBULATORY_CARE_PROVIDER_SITE_OTHER): Payer: Medicare Other | Admitting: Nurse Practitioner

## 2020-08-28 ENCOUNTER — Ambulatory Visit (INDEPENDENT_AMBULATORY_CARE_PROVIDER_SITE_OTHER): Payer: Medicare Other

## 2020-08-28 ENCOUNTER — Encounter (INDEPENDENT_AMBULATORY_CARE_PROVIDER_SITE_OTHER): Payer: Self-pay | Admitting: Nurse Practitioner

## 2020-08-28 ENCOUNTER — Other Ambulatory Visit: Payer: Self-pay

## 2020-08-28 ENCOUNTER — Telehealth: Payer: Self-pay | Admitting: Nurse Practitioner

## 2020-08-28 VITALS — BP 143/66 | HR 66 | Ht 70.0 in | Wt 142.0 lb

## 2020-08-28 DIAGNOSIS — R600 Localized edema: Secondary | ICD-10-CM

## 2020-08-28 DIAGNOSIS — I7025 Atherosclerosis of native arteries of other extremities with ulceration: Secondary | ICD-10-CM

## 2020-08-28 DIAGNOSIS — I10 Essential (primary) hypertension: Secondary | ICD-10-CM

## 2020-08-28 NOTE — Telephone Encounter (Signed)
[name], [name] daughter called about upcoming f/u PC visit which was telemedicine. [name] wishes to reschedule for in person appointment, completed. [name] updated on recent events and further planning. Support provided  Total time 35 minutes Phone discussion 30 minutes Documentation 5 minutes

## 2020-08-28 NOTE — Progress Notes (Signed)
Subjective:    Patient ID: [name], male    DOB: Mar 11, 1928, 87 y.o.   MRN: [id number] Chief Complaint  Patient presents with   Follow-up    2wk [facility] post femoral endarterectomy no studies     [name] is a 87 year old male that presents today for follow-up evaluation after intervention on 08/09/2020.  This intervention included:  PROCEDURE: 1. Right common femoral, superficial femoral and profunda femoris endarterectomy with Cormatrix patch angioplasty. 2. Redo vascular surgery with revision of the right distal limb of the aortobifemoral bypass graft   Following intervention the patient has not had any severe pain of the groin area.  The groin area is healing well there is still Dermabond in place but no evidence of dehiscence.  However the biggest issue is swelling for the patient.  There is some mid thigh swelling but also extensive swelling below the knee.  He notes that he elevates his lower extremities much as possible and it has improved somewhat but it is still extensive.  He denies any fevers or chills.  He denies any open wounds or ulcerations on his leg he still has a wound on his right toe but that it is greatly improved.  Today noninvasive studies are negative for any DVT.   Review of Systems  Cardiovascular:  Positive for leg swelling.  Skin:  Positive for wound.  All other systems reviewed and are negative.     Objective:   Physical Exam Vitals reviewed.  HENT:     Head: Normocephalic.  Cardiovascular:     Rate and Rhythm: Normal rate.  Pulmonary:     Effort: Pulmonary effort is normal.  Musculoskeletal:     Right lower leg: 3+ Edema present.     Left lower leg: No edema.  Skin:    General: Skin is warm and dry.  Neurological:     Mental Status: He is alert and oriented to person, place, and time.  Psychiatric:        Mood and Affect: Mood normal.        Behavior: Behavior normal.        Thought Content: Thought content normal.         Judgment: Judgment normal.    BP (!) 143/66   Pulse 66   Ht '5\' 10"'$  (1.778 m)   Wt 142 lb (64.4 kg)   BMI 20.37 kg/m   Past Medical History:  Diagnosis Date   (HFpEF) heart failure with preserved ejection fraction (HCC)    Aortic atherosclerosis (HCC)    Cardiac murmur    Grade I/VI medium pitched mid systolic blowing at lower LSB   COPD (chronic obstructive pulmonary disease) (HCC)    Coronary artery disease    Dependence on supplemental oxygen    HLD (hyperlipidemia)    Hypertension    Paroxysmal atrial fibrillation (HCC)    PVD (peripheral vascular disease) (HCC)    Skin cancer    nose and ear   Thoracic aortic ectasia (HCC)    a.) measured 3.6 x 3.3 cm on 10/08/2019    Social History   Socioeconomic History   Marital status: Married    Spouse name: Not on file   Number of children: Not on file   Years of education: Not on file   Highest education level: Not on file  Occupational History   Not on file  Tobacco Use   Smoking status: Former    Packs/day: 1.00    Years: 30.00  Pack years: 30.00    Types: Cigarettes    Quit date: 49    Years since quitting: 25.5   Smokeless tobacco: Never  Vaping Use   Vaping Use: Never used  Substance and Sexual Activity   Alcohol use: Never   Drug use: Never   Sexual activity: Not Currently  Other Topics Concern   Not on file  Social History Narrative   Lives at home alone with help from daughter.   Social Determinants of Health   Financial Resource Strain: Not on file  Food Insecurity: Not on file  Transportation Needs: Not on file  Physical Activity: Not on file  Stress: Not on file  Social Connections: Not on file  Intimate Partner Violence: Not on file    Past Surgical History:  Procedure Laterality Date   COLONOSCOPY  2007   ENDARTERECTOMY FEMORAL Right 08/09/2020   Procedure: RIGHT COMMON AND PROFUNDA FEMORAL ENDARTERECTOMY;  Surgeon: Algernon Huxley, MD;  Location: [facility];  Service: Vascular;   Laterality: Right;   EYE SURGERY Bilateral 2005   LOWER EXTREMITY ANGIOGRAPHY Right 07/05/2020   Procedure: LOWER EXTREMITY ANGIOGRAPHY;  Surgeon: Algernon Huxley, MD;  Location: [facility];  Service: Cardiovascular;  Laterality: Right;   Renal Bypass Surgery Bilateral 2000    History reviewed. No pertinent family history.  No Known Allergies  CBC Latest Ref Rng & Units 08/11/2020 08/10/2020 08/09/2020  WBC 4.0 - 10.5 K/uL 9.7 9.3 6.9  Hemoglobin 13.0 - 17.0 g/dL 9.6(L) 9.2(L) 9.6(L)  Hematocrit 39.0 - 52.0 % 30.3(L) 28.2(L) 29.5(L)  Platelets 150 - 400 K/uL 290 230 237      CMP     Component Value Date/Time   NA 137 08/11/2020 0527   NA 140 02/02/2014 0356   K 4.7 08/11/2020 0527   K 3.7 02/02/2014 0356   CL 104 08/11/2020 0527   CL 104 02/02/2014 0356   CO2 29 08/11/2020 0527   CO2 29 02/02/2014 0356   GLUCOSE 101 (H) 08/11/2020 0527   GLUCOSE 160 (H) 02/02/2014 0356   BUN 31 (H) 08/11/2020 0527   BUN 33 (H) 02/02/2014 0356   CREATININE 0.94 08/11/2020 0527   CREATININE 0.86 02/02/2014 0356   CALCIUM 9.2 08/11/2020 0527   CALCIUM 8.6 10/10/2019 0305   PROT 7.6 10/08/2019 1150   PROT 7.5 01/29/2014 0949   ALBUMIN 4.0 10/08/2019 1150   ALBUMIN 3.1 (L) 01/29/2014 0949   AST 19 10/08/2019 1150   AST 39 (H) 01/29/2014 0949   ALT 14 10/08/2019 1150   ALT 27 01/29/2014 0949   ALKPHOS 66 10/08/2019 1150   ALKPHOS 79 01/29/2014 0949   BILITOT 1.1 10/08/2019 1150   BILITOT 0.7 01/29/2014 0949   GFRNONAA >60 08/11/2020 0527   GFRNONAA >60 02/02/2014 0356   GFRNONAA >60 06/25/2011 1325   GFRAA >60 10/15/2019 0634   GFRAA >60 02/02/2014 0356   GFRAA >60 06/25/2011 1325     VAS [place] ABI WITH/WO TBI  Result Date: 06/30/2020  LOWER EXTREMITY DOPPLER STUDY Patient Name:  [name]  Date of Exam:   06/30/2020 Medical Rec #: [id number]          Accession #:    [id number] Date of Birth: 1929/01/15          Patient Gender: M Patient Age:   66Y Exam Location:  [facility] Procedure:      VAS [place] ABI WITH/WO TBI Referring Phys: [id number] [place] --------------------------------------------------------------------------------  Indications: Rt great toe wound x 4 mths.  Performing Technologist: Concha Norway RVT  Examination Guidelines: A complete evaluation includes at minimum, Doppler waveform signals and systolic blood pressure reading at the level of bilateral brachial, anterior tibial, and posterior tibial arteries, when vessel segments are accessible. Bilateral testing is considered an integral part of a complete examination. Photoelectric Plethysmograph (PPG) waveforms and toe systolic pressure readings are included as required and additional duplex testing as needed. Limited examinations for reoccurring indications may be performed as noted.  ABI Findings: +---------+------------------+-----+----------+--------+ Right    Rt Pressure (mmHg)IndexWaveform  Comment  +---------+------------------+-----+----------+--------+ Brachial 156                                       +---------+------------------+-----+----------+--------+ ATA      89                0.57 monophasic         +---------+------------------+-----+----------+--------+ PTA      115               0.74 monophasic         +---------+------------------+-----+----------+--------+ Great Toe29                0.19 Abnormal           +---------+------------------+-----+----------+--------+ +---------+------------------+-----+----------+-------+ Left     Lt Pressure (mmHg)IndexWaveform  Comment +---------+------------------+-----+----------+-------+ ATA      164               1.05 monophasic        +---------+------------------+-----+----------+-------+ PTA      162               1.04 monophasic        +---------+------------------+-----+----------+-------+ Great Toe76                0.49 Abnormal           +---------+------------------+-----+----------+-------+ +-------+-----------+-----------+------------+------------+ ABI/TBIToday's ABIToday's TBIPrevious ABIPrevious TBI +-------+-----------+-----------+------------+------------+ Right  .57        .19                                 +-------+-----------+-----------+------------+------------+ Left   1.05       .49                                 +-------+-----------+-----------+------------+------------+  Summary: Right: Resting right ankle-brachial index indicates moderate right lower extremity arterial disease. The right toe-brachial index is abnormal. Left: Resting left ankle-brachial index is within normal range. No evidence of significant left lower extremity arterial disease. The left toe-brachial index is abnormal.  *See table(s) above for measurements and observations.  Electronically signed by [name] MD on 06/30/2020 at 10:55:37 AM.    Final        Assessment & Plan:   1. Atherosclerosis of native arteries of the extremities with ulceration ([place]) Today the patient's incision is healing well without significant issues.  The biggest issue the patient currently has his edema.  We performed a noninvasive study to evaluate for possible DVT.  None was seen today.  We will place the patient in Dana wraps in order to help gain control the lower extremity edema better.  We will have the patient return for noninvasive studies of his arteries in 4  weeks with the evaluation of lower extremity edema following wraps.  2. Primary hypertension Continue antihypertensive medications as already ordered, these medications have been reviewed and there are no changes at this time.    Current Outpatient Medications on File Prior to Visit  Medication Sig Dispense Refill   Acetaminophen 500 MG capsule Take by mouth.     albuterol (PROVENTIL) (2.5 MG/3ML) 0.083% nebulizer solution Take 3 mLs (2.5 mg total) by nebulization every 6 (six) hours as  needed for wheezing or shortness of breath. 75 mL 0   alendronate (FOSAMAX) 70 MG tablet Take 70 mg by mouth once a week. Sunday morning with full glass of water and sit up do not lay down     apixaban (ELIQUIS) 2.5 MG TABS tablet Take 2.5 mg by mouth 2 (two) times daily.     ascorbic acid (VITAMIN C) 250 MG tablet Take 1 tablet (250 mg total) by mouth 2 (two) times daily. 60 tablet 6   aspirin EC 81 MG EC tablet Take 1 tablet (81 mg total) by mouth daily at 6 (six) AM. Swallow whole. 90 tablet 3   atorvastatin (LIPITOR) 20 MG tablet Take 1 tablet (20 mg total) by mouth daily. 90 tablet 3   Calcium Carb-Cholecalciferol (CALCIUM 600/VITAMIN D PO) Take by mouth.     CARTIA XT 300 MG 24 hr capsule Take 300 mg by mouth daily.     diltiazem (TIAZAC) 300 MG 24 hr capsule Take by mouth.     feeding supplement (ENSURE ENLIVE / ENSURE PLUS) LIQD Take 237 mLs by mouth 2 (two) times daily between meals. 237 mL 12   Fluticasone-Umeclidin-Vilant (TRELEGY ELLIPTA) 100-62.5-25 MCG/INH AEPB Inhale into the lungs.     furosemide (LASIX) 20 MG tablet TAKE 1 TABLET BY MOUTH DAILY (Patient taking differently: 20 mg. Two tablets M_W_F and 1 tablet Tues, Thurs, Sat and Sun) 30 tablet 5   metoprolol succinate (TOPROL-XL) 25 MG 24 hr tablet Take 1 tablet by mouth daily.     metoprolol tartrate (LOPRESSOR) 50 MG tablet Take 50 mg by mouth 2 (two) times daily.     Multiple Vitamin (MULTIVITAMIN WITH MINERALS) TABS tablet Take 1 tablet by mouth daily. 90 tablet 3   mupirocin ointment (BACTROBAN) 2 % 1 application daily.     oxyCODONE-acetaminophen (PERCOCET/ROXICET) 5-325 MG tablet Take 1 tablet by mouth every 8 (eight) hours as needed for moderate pain. 20 tablet 0   potassium chloride (KLOR-CON) 10 MEQ tablet Take 2 tablets (20 mEq total) by mouth daily. (Patient taking differently: Take 20 mEq by mouth daily. 2 tablets M-W-F 1 tablet Tues, thurs, Sat and Sun) 60 tablet 5   predniSONE (DELTASONE) 10 MG tablet Take 5 mg by  mouth daily with breakfast.     senna-docusate (SENOKOT-S) 8.6-50 MG tablet Take 1 tablet by mouth daily.     Vitamin D, Ergocalciferol, (DRISDOL) 1.25 MG (50000 UNIT) CAPS capsule Take 1 capsule (50,000 Units total) by mouth every 7 (seven) days. 5 capsule 0   Wheat Dextrin (BENEFIBER PO) Take by mouth. 2 teaspoons twice a day     traZODone (DESYREL) 50 MG tablet Take 50 mg by mouth at bedtime as needed for sleep.  (Patient not taking: No sig reported)     No current facility-administered medications on file prior to visit.    There are no Patient Instructions on file for this visit. No follow-ups on file.   Kris Hartmann, NP

## 2020-08-30 ENCOUNTER — Other Ambulatory Visit: Payer: Medicare Other | Admitting: Nurse Practitioner

## 2020-09-04 ENCOUNTER — Ambulatory Visit (INDEPENDENT_AMBULATORY_CARE_PROVIDER_SITE_OTHER): Payer: Medicare Other | Admitting: Vascular Surgery

## 2020-09-04 ENCOUNTER — Encounter (INDEPENDENT_AMBULATORY_CARE_PROVIDER_SITE_OTHER): Payer: Self-pay

## 2020-09-04 ENCOUNTER — Other Ambulatory Visit: Payer: Self-pay

## 2020-09-04 DIAGNOSIS — L97319 Non-pressure chronic ulcer of right ankle with unspecified severity: Secondary | ICD-10-CM | POA: Diagnosis not present

## 2020-09-04 DIAGNOSIS — I83013 Varicose veins of right lower extremity with ulcer of ankle: Secondary | ICD-10-CM

## 2020-09-04 NOTE — Progress Notes (Signed)
History of Present Illness  There is no documented history at this time  Assessments & Plan   There are no diagnoses linked to this encounter.    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    Procedure:  3 layer unna wrap was placed Right lower extremity.   Plan:   Follow up in one week.   

## 2020-09-06 ENCOUNTER — Encounter: Payer: Self-pay | Admitting: Nurse Practitioner

## 2020-09-06 ENCOUNTER — Other Ambulatory Visit: Payer: Medicare Other | Admitting: Nurse Practitioner

## 2020-09-06 DIAGNOSIS — Z515 Encounter for palliative care: Secondary | ICD-10-CM

## 2020-09-06 DIAGNOSIS — R0602 Shortness of breath: Secondary | ICD-10-CM

## 2020-09-06 NOTE — Progress Notes (Signed)
[facility] Consult Note Telephone: [phone]  Fax: [phone]    Date of encounter: 09/06/20 PATIENT NAME: [name] [id number]   [phone] (home)  DOB: 09/08/1928 MRN: [id number] PRIMARY CARE PROVIDER:    Baxter Hire, MD,  [place] [address] [phone]  RESPONSIBLE PARTY:    Contact Information     Name Relation Home Work Mobile   [name], [name] Daughter   [phone]      Due to the COVID-19 crisis, this visit was done via telemedicine from my office and it was initiated and consent by this patient and or family.  I connected with  [name] on 09/06/20 by a video enabled telemedicine application and verified that I am speaking with the correct person.   I discussed the limitations of evaluation and management by telemedicine. The patient expressed understanding and agreed to proceed. This is a follow up visit.                                  ASSESSMENT AND PLAN / RECOMMENDATIONS:  Symptom Management/Plan: 1. ACP: wishes for DNR; in vynca;   2. Shortness of breath, stable, continue to monitor respiratory status; O2, nebulizers, pulmonary toileting  3. CHF stable, reviewed continue with community paramedic program, daily weights, monitoring edema; continue to encourage to elevate legs   4. COPD stable; reviewed continue with O2, inhalation therapy, monitoring respiratory status, pulmonary toileting  Follow up Palliative Care Visit: Palliative care will continue to follow for complex medical decision making, advance care planning, and clarification of goals. Return 6 weeks or prn.  I spent 37 minutes providing this consultation. More than 50% of the time in this consultation was spent in counseling and care coordination. PPS: 50%  Chief Complaint: Follow up Palliative consult complex medical decision making  HISTORY OF PRESENT  ILLNESS:  [name] is a 87 y.o. year old male  with multiple medical problems including COPD, O2 dependence, Coronary artery disease, atrial fibrillation, chronic diastolic congestive heart failure, hypertension, hyperlipidemia, history of covid-19 renal bypass surgery. I called [name], Mr. Chatel daughter, confirmed PC f/u visit telemedicine telephonic as video not available. [name] in agreement. [name] and I talked about how Mr. Mehler has been doing. [name] endorses Mr. Tuong has been doing much better. His toe/wound has been improving. No recent exacerbations. Ms. Apolonio appetite continues to be declined. Overall Mr. Sielski has been improving. I called Mr. Rinehimer. We talked about purpose PC visit. We talked about how he was feeling. Mr Angier endorses endorses he was doing much better. We talked about symptoms including shortness of breath, pain, appetite. We talked about nutrition. We talked about supplements such as boost. Mr. Merenda endorses he did not care for taste of ensure. We talked about his activity level, energy conservation. We talked about chronic disease progression which has been stable CHF, COPD. We talked about role pc in poc. We talked about medical goals of care. Emotional support provided. Questions answered.   History obtained from review of EMR, discussion with Mr. Gialanella. I reviewed available labs, medications, imaging, studies and related documents from the EMR.  Records reviewed and summarized above.   ROS Full 14 system review of systems performed and negative with exception of: as per HPI.   Physical Exam: deferred  Questions and concerns were addressed. The patient  was encouraged to call with questions and/or concerns. My contact information was provided. Provided general support and encouragement, no other unmet needs identified   Thank you for the opportunity to participate in the care of Mr. Rolnick.  The palliative care team will  continue to follow. Please call our office at [phone] if we can be of additional assistance.   This chart was dictated using voice recognition software.  Despite best efforts to proofread,  errors can occur which can change the documentation meaning.   Eunice Winecoff Ihor Gully, NP

## 2020-09-07 ENCOUNTER — Other Ambulatory Visit: Payer: Self-pay

## 2020-09-11 ENCOUNTER — Ambulatory Visit (INDEPENDENT_AMBULATORY_CARE_PROVIDER_SITE_OTHER): Payer: Medicare Other | Admitting: Nurse Practitioner

## 2020-09-11 ENCOUNTER — Encounter (INDEPENDENT_AMBULATORY_CARE_PROVIDER_SITE_OTHER): Payer: Self-pay

## 2020-09-11 ENCOUNTER — Other Ambulatory Visit: Payer: Self-pay

## 2020-09-11 VITALS — BP 139/80 | HR 87 | Resp 19 | Ht 70.0 in | Wt 142.0 lb

## 2020-09-11 DIAGNOSIS — I83013 Varicose veins of right lower extremity with ulcer of ankle: Secondary | ICD-10-CM | POA: Diagnosis not present

## 2020-09-11 DIAGNOSIS — L97319 Non-pressure chronic ulcer of right ankle with unspecified severity: Secondary | ICD-10-CM | POA: Diagnosis not present

## 2020-09-11 NOTE — Progress Notes (Signed)
History of Present Illness  There is no documented history at this time  Assessments & Plan   There are no diagnoses linked to this encounter.    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    Procedure:  3 layer unna wrap was placed Right lower extremity.   Plan:   Follow up in one week.   

## 2020-09-18 ENCOUNTER — Other Ambulatory Visit: Payer: Self-pay

## 2020-09-18 ENCOUNTER — Ambulatory Visit (INDEPENDENT_AMBULATORY_CARE_PROVIDER_SITE_OTHER): Payer: Medicare Other | Admitting: Nurse Practitioner

## 2020-09-18 DIAGNOSIS — I83013 Varicose veins of right lower extremity with ulcer of ankle: Secondary | ICD-10-CM

## 2020-09-18 DIAGNOSIS — L97319 Non-pressure chronic ulcer of right ankle with unspecified severity: Secondary | ICD-10-CM

## 2020-09-18 NOTE — Progress Notes (Signed)
History of Present Illness  There is no documented history at this time  Assessments & Plan   There are no diagnoses linked to this encounter.    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    Procedure:  3 layer unna wrap was placed Right lower extremity.   Plan:   Follow up in one week.   

## 2020-09-25 ENCOUNTER — Encounter (INDEPENDENT_AMBULATORY_CARE_PROVIDER_SITE_OTHER): Payer: Self-pay | Admitting: Nurse Practitioner

## 2020-09-26 ENCOUNTER — Other Ambulatory Visit: Payer: Self-pay

## 2020-09-26 ENCOUNTER — Ambulatory Visit (INDEPENDENT_AMBULATORY_CARE_PROVIDER_SITE_OTHER): Payer: Medicare Other

## 2020-09-26 ENCOUNTER — Ambulatory Visit (INDEPENDENT_AMBULATORY_CARE_PROVIDER_SITE_OTHER): Payer: Medicare Other | Admitting: Vascular Surgery

## 2020-09-26 VITALS — BP 188/113 | HR 92 | Ht 70.0 in | Wt 139.0 lb

## 2020-09-26 DIAGNOSIS — I70299 Other atherosclerosis of native arteries of extremities, unspecified extremity: Secondary | ICD-10-CM

## 2020-09-26 DIAGNOSIS — J449 Chronic obstructive pulmonary disease, unspecified: Secondary | ICD-10-CM

## 2020-09-26 DIAGNOSIS — I7025 Atherosclerosis of native arteries of other extremities with ulceration: Secondary | ICD-10-CM | POA: Diagnosis not present

## 2020-09-26 DIAGNOSIS — L97319 Non-pressure chronic ulcer of right ankle with unspecified severity: Secondary | ICD-10-CM

## 2020-09-26 DIAGNOSIS — L97909 Non-pressure chronic ulcer of unspecified part of unspecified lower leg with unspecified severity: Secondary | ICD-10-CM

## 2020-09-26 DIAGNOSIS — I83013 Varicose veins of right lower extremity with ulcer of ankle: Secondary | ICD-10-CM

## 2020-09-26 NOTE — Assessment & Plan Note (Signed)
Wound has now healed and his swelling is markedly improved.  Rebound come out of the Unna boots and just go to compression socks and elevation.

## 2020-09-26 NOTE — Assessment & Plan Note (Signed)
ABIs are noncompressible but his digital waveforms on the right are markedly improved with a right digital pressure of 137.  His left digital pressure was over 200.  Leg is markedly improving.  Organ to try to come out of the [employer] today.  Follow-up with his podiatrist later this week.  Recheck ABIs in 2 to 3 months.

## 2020-09-26 NOTE — Progress Notes (Signed)
MRN : [id number]  [name] is a 87 y.o. (12/24/1928) male who presents with chief complaint of  Chief Complaint  Patient presents with   Follow-up    U/S and Rt unna boot check  .  History of Present Illness: Patient returns today in follow up of his right great toe wound, right leg swelling, and right leg ischemia status post right femoral endarterectomy about 5 weeks ago.  He is doing well.  He has been in [employer] for the swelling and his swelling is markedly improved on the right.  His toe ulceration is almost completely healed.  His groin incision is healed.  Current Outpatient Medications  Medication Sig Dispense Refill   Acetaminophen 500 MG capsule Take by mouth.     albuterol (PROVENTIL) (2.5 MG/3ML) 0.083% nebulizer solution Take 3 mLs (2.5 mg total) by nebulization every 6 (six) hours as needed for wheezing or shortness of breath. 75 mL 0   alendronate (FOSAMAX) 70 MG tablet Take 70 mg by mouth once a week. Sunday morning with full glass of water and sit up do not lay down     apixaban (ELIQUIS) 2.5 MG TABS tablet Take 2.5 mg by mouth 2 (two) times daily.     ascorbic acid (VITAMIN C) 250 MG tablet Take 1 tablet (250 mg total) by mouth 2 (two) times daily. 60 tablet 6   aspirin EC 81 MG EC tablet Take 1 tablet (81 mg total) by mouth daily at 6 (six) AM. Swallow whole. 90 tablet 3   atorvastatin (LIPITOR) 20 MG tablet Take 1 tablet (20 mg total) by mouth daily. 90 tablet 3   Calcium Carb-Cholecalciferol (CALCIUM 600/VITAMIN D PO) Take by mouth.     CARTIA XT 300 MG 24 hr capsule Take 300 mg by mouth daily.     diltiazem (TIAZAC) 300 MG 24 hr capsule Take by mouth.     feeding supplement (ENSURE ENLIVE / ENSURE PLUS) LIQD Take 237 mLs by mouth 2 (two) times daily between meals. 237 mL 12   Fluticasone-Umeclidin-Vilant (TRELEGY ELLIPTA) 100-62.5-25 MCG/INH AEPB Inhale into the lungs.     furosemide (LASIX) 20 MG tablet TAKE 1 TABLET BY MOUTH DAILY (Patient taking  differently: 20 mg. Two tablets M_W_F and 1 tablet Tues, Thurs, Sat and Sun) 30 tablet 5   metoprolol succinate (TOPROL-XL) 25 MG 24 hr tablet Take 1 tablet by mouth daily.     metoprolol tartrate (LOPRESSOR) 50 MG tablet Take 50 mg by mouth 2 (two) times daily.     Multiple Vitamin (MULTIVITAMIN WITH MINERALS) TABS tablet Take 1 tablet by mouth daily. 90 tablet 3   mupirocin ointment (BACTROBAN) 2 % 1 application daily.     oxyCODONE-acetaminophen (PERCOCET/ROXICET) 5-325 MG tablet Take 1 tablet by mouth every 8 (eight) hours as needed for moderate pain. 20 tablet 0   potassium chloride (KLOR-CON) 10 MEQ tablet Take 2 tablets (20 mEq total) by mouth daily. (Patient taking differently: Take 20 mEq by mouth daily. 2 tablets M-W-F 1 tablet Tues, thurs, Sat and Sun) 60 tablet 5   predniSONE (DELTASONE) 10 MG tablet Take 5 mg by mouth daily with breakfast.     senna-docusate (SENOKOT-S) 8.6-50 MG tablet Take 1 tablet by mouth daily.     Vitamin D, Ergocalciferol, (DRISDOL) 1.25 MG (50000 UNIT) CAPS capsule Take 1 capsule (50,000 Units total) by mouth every 7 (seven) days. 5 capsule 0   Wheat Dextrin (BENEFIBER PO) Take by mouth. 2 teaspoons twice  a day     traZODone (DESYREL) 50 MG tablet Take 50 mg by mouth at bedtime as needed for sleep.  (Patient not taking: No sig reported)     No current facility-administered medications for this visit.    Past Medical History:  Diagnosis Date   (HFpEF) heart failure with preserved ejection fraction (HCC)    Aortic atherosclerosis (HCC)    Cardiac murmur    Grade I/VI medium pitched mid systolic blowing at lower LSB   COPD (chronic obstructive pulmonary disease) (HCC)    Coronary artery disease    Dependence on supplemental oxygen    HLD (hyperlipidemia)    Hypertension    Paroxysmal atrial fibrillation (HCC)    PVD (peripheral vascular disease) (HCC)    Skin cancer    nose and ear   Thoracic aortic ectasia (HCC)    a.) measured 3.6 x 3.3 cm on  10/08/2019    Past Surgical History:  Procedure Laterality Date   COLONOSCOPY  2007   ENDARTERECTOMY FEMORAL Right 08/09/2020   Procedure: RIGHT COMMON AND PROFUNDA FEMORAL ENDARTERECTOMY;  Surgeon: Algernon Huxley, MD;  Location: [facility];  Service: Vascular;  Laterality: Right;   EYE SURGERY Bilateral 2005   LOWER EXTREMITY ANGIOGRAPHY Right 07/05/2020   Procedure: LOWER EXTREMITY ANGIOGRAPHY;  Surgeon: Algernon Huxley, MD;  Location: [facility];  Service: Cardiovascular;  Laterality: Right;   Renal Bypass Surgery Bilateral 2000     Social History   Tobacco Use   Smoking status: Former    Packs/day: 1.00    Years: 30.00    Pack years: 30.00    Types: Cigarettes    Quit date: 1997    Years since quitting: 25.6   Smokeless tobacco: Never  Vaping Use   Vaping Use: Never used  Substance Use Topics   Alcohol use: Never   Drug use: Never       No Known Allergies    Physical Examination  BP (!) 188/113   Pulse 92   Ht '5\' 10"'$  (1.778 m)   Wt 139 lb (63 kg)   BMI 19.94 kg/m  Gen:  WD/WN, NAD Head: [place]/AT, No temporalis wasting. Ear/Nose/Throat: Hearing grossly intact, nares w/o erythema or drainage Eyes: Conjunctiva clear. Sclera non-icteric Neck: Supple.  Trachea midline Pulmonary:  Good air movement, no use of accessory muscles on supplemental oxygen.  Cardiac: irregular Vascular:  Vessel Right Left  Radial Palpable Palpable                          PT Palpable Palpable  DP Palpable Palpable   Gastrointestinal: soft, non-tender/non-distended. No guarding/reflex.  Musculoskeletal: M/S 5/5 throughout.  No deformity or atrophy. Minimal BLE edema. Neurologic: Sensation grossly intact in extremities.  Symmetrical.  Speech is fluent.  Psychiatric: Judgment intact, Mood & affect appropriate for pt's clinical situation. Dermatologic: pinhole right great toe amputation at the tip.  No erythema or drainage.      Labs Recent Results (from the past 2160  hour(s))  BUN     Status: None   Collection Time: 07/05/20  9:14 AM  Result Value Ref Range   BUN 23 8 - 23 mg/dL    Comment: Performed at [facility], [address] [place], [place] [address]  Creatinine, serum     Status: None   Collection Time: 07/05/20  9:14 AM  Result Value Ref Range   Creatinine, Ser 1.00 0.61 - 1.24 mg/dL  GFR, Estimated >60 >60 mL/min    Comment: (NOTE) Calculated using the CKD-EPI Creatinine Equation (2021) Performed at [facility], [address] [place], [place] [address]   CBC WITH DIFFERENTIAL     Status: Abnormal   Collection Time: 08/07/20  9:05 AM  Result Value Ref Range   WBC 10.4 4.0 - 10.5 K/uL   RBC 3.53 (L) 4.22 - 5.81 MIL/uL   Hemoglobin 10.3 (L) 13.0 - 17.0 g/dL   HCT 31.9 (L) 39.0 - 52.0 %   MCV 90.4 80.0 - 100.0 fL   MCH 29.2 26.0 - 34.0 pg   MCHC 32.3 30.0 - 36.0 g/dL   RDW 15.3 11.5 - 15.5 %   Platelets 275 150 - 400 K/uL   nRBC 0.0 0.0 - 0.2 %   Neutrophils Relative % 63 %   Neutro Abs 6.5 1.7 - 7.7 K/uL   Lymphocytes Relative 17 %   Lymphs Abs 1.8 0.7 - 4.0 K/uL   Monocytes Relative 14 %   Monocytes Absolute 1.5 (H) 0.1 - 1.0 K/uL   Eosinophils Relative 5 %   Eosinophils Absolute 0.6 (H) 0.0 - 0.5 K/uL   Basophils Relative 0 %   Basophils Absolute 0.0 0.0 - 0.1 K/uL   Immature Granulocytes 1 %   Abs Immature Granulocytes 0.05 0.00 - 0.07 K/uL    Comment: Performed at [facility] [address] [place], [place] [id number]  Basic metabolic panel     Status: Abnormal   Collection Time: 08/07/20  9:05 AM  Result Value Ref Range   Sodium 133 (L) 135 - 145 mmol/L   Potassium 3.7 3.5 - 5.1 mmol/L   Chloride 96 (L) 98 - 111 mmol/L   CO2 30 22 - 32 mmol/L   Glucose, Bld 82 70 - 99 mg/dL    Comment: Glucose reference range applies only to samples taken after fasting for at least 8 hours.   BUN 31 (H) 8 - 23 mg/dL   Creatinine, Ser 1.04 0.61 - 1.24 mg/dL   Calcium 9.0 8.9 - 10.3 mg/dL   GFR,  Estimated >60 >60 mL/min    Comment: (NOTE) Calculated using the CKD-EPI Creatinine Equation (2021)    Anion gap 7 5 - 15    Comment: Performed at [facility], [address] [place], [place] [address]  Type and screen     Status: None   Collection Time: 08/07/20  9:05 AM  Result Value Ref Range   ABO/RH(D) O POS    Antibody Screen NEG    Sample Expiration 08/21/2020,2359    Extend sample reason      NO TRANSFUSIONS OR PREGNANCY IN THE PAST 3 MONTHS Performed at [facility], [address] [place], [place] [address]   SARS CORONAVIRUS 2 (TAT 6-24 HRS) Nasopharyngeal Nasopharyngeal Swab     Status: None   Collection Time: 08/07/20  9:28 AM   Specimen: Nasopharyngeal Swab  Result Value Ref Range   SARS Coronavirus 2 NEGATIVE NEGATIVE    Comment: (NOTE) SARS-CoV-2 target nucleic acids are NOT DETECTED.  The SARS-CoV-2 RNA is generally detectable in upper and lower respiratory specimens during the acute phase of infection. Negative results do not preclude SARS-CoV-2 infection, do not rule out co-infections with other pathogens, and should not be used as the sole basis for treatment or other patient management decisions. Negative results must be combined with clinical observations, patient history, and epidemiological information. The expected result is Negative.  Fact Sheet for  Patients: SugarRoll.be  Fact Sheet for Healthcare Providers: https://www.woods-mathews.com/  This test is not yet approved or cleared by the [place] FDA and  has been authorized for detection and/or diagnosis of SARS-CoV-2 by FDA under an Emergency Use Authorization (EUA). This EUA will remain  in effect (meaning this test can be used) for the duration of the COVID-19 declaration under Se ction 564(b)(1) of the Act, 21 U.S.C. section 360bbb-3(b)(1), unless the authorization is terminated or revoked sooner.  Performed at [facility], [place] [address], [place], [place] [address]   Surgical pathology     Status: None   Collection Time: 08/09/20  9:05 AM  Result Value Ref Range   SURGICAL PATHOLOGY      SURGICAL PATHOLOGY CASE: [id number] PATIENT: Jeannine Boga Surgical Pathology Report     Specimen Submitted: A. Right Common Femoral Profunda SFA Plaque  Clinical History: Aso with ulceration      DIAGNOSIS: A. PLAQUE, RIGHT COMMON FEMORAL PROFUNDA SFA; ENDARTERECTOMY: - CALCIFIED ATHEROMATOUS PLAQUE.  GROSS DESCRIPTION: A. Labeled: Right common femoral profunda SFA plaque Received: Formalin Collection time: 9:05 AM on 08/09/2020 Placed into formalin time: 9:09 AM on 08/09/2020 Tissue fragment(s): Multiple Size: Aggregate, 3 x 2.9 x 1.6 cm Description: Received are fragments of tan membranous tissue with diffuse areas of thickening and calcification.  Representative sections are submitted in 1 cassette.  Tissue decalcification: Cassette 1  RB 08/09/2020  Final Diagnosis performed by Quay Burow, MD.   Electronically signed 08/11/2020 10:03:06AM The electronic signature indicates that the named Attending Pathologist has evaluated the specimen T echnical component performed at [facility], [address] [place], [place] [address] Lab: [phone] Dir: Rush Farmer, MD, MMM  Professional component performed at [facility], [facility], [address], [place], [place] [address] Lab: [phone] Dir: Dellia Nims. Reuel Derby, MD   MRSA Next Gen by PCR, Nasal     Status: None   Collection Time: 08/09/20 11:44 AM   Specimen: Nasal Mucosa; Nasal Swab  Result Value Ref Range   MRSA by PCR Next Gen NOT DETECTED NOT DETECTED    Comment: (NOTE) The GeneXpert MRSA Assay (FDA approved for NASAL specimens only), is one component of a comprehensive MRSA colonization surveillance program. It is not intended to diagnose MRSA infection nor to guide or monitor treatment for MRSA  infections. Test performance is not FDA approved in patients less than 29 years old. Performed at [facility], [address] [place], [place] [address]   Glucose, capillary     Status: Abnormal   Collection Time: 08/09/20 11:46 AM  Result Value Ref Range   Glucose-Capillary 104 (H) 70 - 99 mg/dL    Comment: Glucose reference range applies only to samples taken after fasting for at least 8 hours.  CBC     Status: Abnormal   Collection Time: 08/09/20 12:40 PM  Result Value Ref Range   WBC 6.9 4.0 - 10.5 K/uL   RBC 3.20 (L) 4.22 - 5.81 MIL/uL   Hemoglobin 9.6 (L) 13.0 - 17.0 g/dL   HCT 29.5 (L) 39.0 - 52.0 %   MCV 92.2 80.0 - 100.0 fL   MCH 30.0 26.0 - 34.0 pg   MCHC 32.5 30.0 - 36.0 g/dL   RDW 14.9 11.5 - 15.5 %   Platelets 237 150 - 400 K/uL   nRBC 0.0 0.0 - 0.2 %    Comment: Performed at [facility], [address] [place],  [address]  Creatinine, serum  Status: None   Collection Time: 08/09/20 12:40 PM  Result Value Ref Range   Creatinine, Ser 0.98 0.61 - 1.24 mg/dL   GFR, Estimated >60 >60 mL/min    Comment: (NOTE) Calculated using the CKD-EPI Creatinine Equation (2021) Performed at [facility], [address] [place], [place] [address]   CBC     Status: Abnormal   Collection Time: 08/10/20  1:05 AM  Result Value Ref Range   WBC 9.3 4.0 - 10.5 K/uL   RBC 3.11 (L) 4.22 - 5.81 MIL/uL   Hemoglobin 9.2 (L) 13.0 - 17.0 g/dL   HCT 28.2 (L) 39.0 - 52.0 %   MCV 90.7 80.0 - 100.0 fL   MCH 29.6 26.0 - 34.0 pg   MCHC 32.6 30.0 - 36.0 g/dL   RDW 14.9 11.5 - 15.5 %   Platelets 230 150 - 400 K/uL   nRBC 0.0 0.0 - 0.2 %    Comment: Performed at [facility], [address] [place], [place] [id number]  Basic metabolic panel     Status: Abnormal   Collection Time: 08/10/20  1:05 AM  Result Value Ref Range   Sodium 133 (L) 135 - 145 mmol/L   Potassium 4.4 3.5 - 5.1 mmol/L   Chloride 104 98 - 111 mmol/L   CO2 24 22 - 32  mmol/L   Glucose, Bld 181 (H) 70 - 99 mg/dL    Comment: Glucose reference range applies only to samples taken after fasting for at least 8 hours.   BUN 28 (H) 8 - 23 mg/dL   Creatinine, Ser 0.89 0.61 - 1.24 mg/dL   Calcium 7.8 (L) 8.9 - 10.3 mg/dL   GFR, Estimated >60 >60 mL/min    Comment: (NOTE) Calculated using the CKD-EPI Creatinine Equation (2021)    Anion gap 5 5 - 15    Comment: Performed at [facility], [address] [place], [place] [address]  CBC     Status: Abnormal   Collection Time: 08/11/20  5:27 AM  Result Value Ref Range   WBC 9.7 4.0 - 10.5 K/uL   RBC 3.37 (L) 4.22 - 5.81 MIL/uL   Hemoglobin 9.6 (L) 13.0 - 17.0 g/dL   HCT 30.3 (L) 39.0 - 52.0 %   MCV 89.9 80.0 - 100.0 fL   MCH 28.5 26.0 - 34.0 pg   MCHC 31.7 30.0 - 36.0 g/dL   RDW 15.0 11.5 - 15.5 %   Platelets 290 150 - 400 K/uL   nRBC 0.0 0.0 - 0.2 %    Comment: Performed at [facility], [address], [place], [place] [id number]  Basic metabolic panel     Status: Abnormal   Collection Time: 08/11/20  5:27 AM  Result Value Ref Range   Sodium 137 135 - 145 mmol/L   Potassium 4.7 3.5 - 5.1 mmol/L   Chloride 104 98 - 111 mmol/L   CO2 29 22 - 32 mmol/L   Glucose, Bld 101 (H) 70 - 99 mg/dL    Comment: Glucose reference range applies only to samples taken after fasting for at least 8 hours.   BUN 31 (H) 8 - 23 mg/dL   Creatinine, Ser 0.94 0.61 - 1.24 mg/dL   Calcium 9.2 8.9 - 10.3 mg/dL   GFR, Estimated >60 >60 mL/min    Comment: (NOTE) Calculated using the CKD-EPI Creatinine Equation (2021)    Anion gap 4 (L) 5 - 15    Comment: Performed at [facility],  [place], [place] [address]  Magnesium     Status: None   Collection Time: 08/11/20  5:27 AM  Result Value Ref Range   Magnesium 2.0 1.7 - 2.4 mg/dL    Comment: Performed at [facility], [address] [place], [place] [address]    Radiology VAS [place] LOWER EXTREMITY VENOUS (DVT)  Result  Date: 09/05/2020  Lower Venous DVT Study Patient Name:  BRIANNE MUHS  Date of Exam:   08/28/2020 Medical Rec #: [id number]          Accession #:    [id number] Date of Birth: Nov 10, 1928          Patient Gender: M Patient Age:   41 years Exam Location:  [facility] Procedure:      VAS [place] LOWER EXTREMITY VENOUS (DVT) Referring Phys: Eulogio Ditch --------------------------------------------------------------------------------  Indications: Pain.  Performing Technologist: Charlane Ferretti RT (R)(VS)  Examination Guidelines: A complete evaluation includes B-mode imaging, spectral Doppler, color Doppler, and power Doppler as needed of all accessible portions of each vessel. Bilateral testing is considered an integral part of a complete examination. Limited examinations for reoccurring indications may be performed as noted. The reflux portion of the exam is performed with the patient in reverse Trendelenburg.  +---------+---------------+---------+-----------+----------+--------------+ RIGHT    CompressibilityPhasicitySpontaneityPropertiesThrombus Aging +---------+---------------+---------+-----------+----------+--------------+ CFV      Full                                                        +---------+---------------+---------+-----------+----------+--------------+ SFJ      Full                                                        +---------+---------------+---------+-----------+----------+--------------+ FV Prox  Full                                                        +---------+---------------+---------+-----------+----------+--------------+ FV Mid   Full                                                        +---------+---------------+---------+-----------+----------+--------------+ FV DistalFull                                                        +---------+---------------+---------+-----------+----------+--------------+ POP      Full                                                         +---------+---------------+---------+-----------+----------+--------------+ Gastroc  Full                                                        +---------+---------------+---------+-----------+----------+--------------+     Summary: RIGHT: - There is no evidence of deep vein thrombosis in the lower extremity. - There is no evidence of superficial venous thrombosis.   *See table(s) above for measurements and observations. Electronically signed by [name] MD on 09/05/2020 at 5:12:49 PM.    Final     Assessment/Plan  Atherosclerosis of artery of extremity with ulceration ([place]) ABIs are noncompressible but his digital waveforms on the right are markedly improved with a right digital pressure of 137.  His left digital pressure was over 200.  Leg is markedly improving.  Organ to try to come out of the [employer] today.  Follow-up with his podiatrist later this week.  Recheck ABIs in 2 to 3 months.  COPD (chronic obstructive pulmonary disease) (HCC) On oxygen  Venous ulcer of ankle, right (HCC) Wound has now healed and his swelling is markedly improved.  Rebound come out of the Unna boots and just go to compression socks and elevation.    [name], MD  09/26/2020 11:13 AM    This note was created with Dragon medical transcription system.  Any errors from dictation are purely unintentional

## 2020-09-26 NOTE — Assessment & Plan Note (Signed)
On oxygen 

## 2020-10-11 ENCOUNTER — Ambulatory Visit (INDEPENDENT_AMBULATORY_CARE_PROVIDER_SITE_OTHER): Payer: Medicare Other | Admitting: Dermatology

## 2020-10-11 ENCOUNTER — Other Ambulatory Visit: Payer: Self-pay

## 2020-10-11 DIAGNOSIS — L720 Epidermal cyst: Secondary | ICD-10-CM

## 2020-10-11 DIAGNOSIS — D692 Other nonthrombocytopenic purpura: Secondary | ICD-10-CM

## 2020-10-11 DIAGNOSIS — B353 Tinea pedis: Secondary | ICD-10-CM

## 2020-10-11 DIAGNOSIS — L578 Other skin changes due to chronic exposure to nonionizing radiation: Secondary | ICD-10-CM

## 2020-10-11 DIAGNOSIS — D2239 Melanocytic nevi of other parts of face: Secondary | ICD-10-CM | POA: Diagnosis not present

## 2020-10-11 DIAGNOSIS — D229 Melanocytic nevi, unspecified: Secondary | ICD-10-CM

## 2020-10-11 DIAGNOSIS — D2361 Other benign neoplasm of skin of right upper limb, including shoulder: Secondary | ICD-10-CM

## 2020-10-11 DIAGNOSIS — Z85828 Personal history of other malignant neoplasm of skin: Secondary | ICD-10-CM

## 2020-10-11 DIAGNOSIS — L820 Inflamed seborrheic keratosis: Secondary | ICD-10-CM

## 2020-10-11 DIAGNOSIS — D1800 Hemangioma unspecified site: Secondary | ICD-10-CM

## 2020-10-11 DIAGNOSIS — D239 Other benign neoplasm of skin, unspecified: Secondary | ICD-10-CM

## 2020-10-11 DIAGNOSIS — Z1283 Encounter for screening for malignant neoplasm of skin: Secondary | ICD-10-CM

## 2020-10-11 DIAGNOSIS — L814 Other melanin hyperpigmentation: Secondary | ICD-10-CM

## 2020-10-11 DIAGNOSIS — L821 Other seborrheic keratosis: Secondary | ICD-10-CM

## 2020-10-11 DIAGNOSIS — L570 Actinic keratosis: Secondary | ICD-10-CM

## 2020-10-11 MED ORDER — KETOCONAZOLE 2 % EX CREA
1.0000 | TOPICAL_CREAM | Freq: Every day | CUTANEOUS | 11 refills | Status: DC
Start: 2020-10-11 — End: 2021-07-25

## 2020-10-11 NOTE — Progress Notes (Signed)
New Patient Visit  Subjective  [name] is a 87 y.o. male who presents for the following: Other (Spot on his nose. He has had skin cancer on his nose in the past. Also has a spot on right ear. Desires a TBSE today).  Accompanied by daughter who contributes to history.  The following portions of the chart were reviewed this encounter and updated as appropriate:   Tobacco  Allergies  Meds  Problems  Med Hx  Surg Hx  Fam Hx     Review of Systems:  No other skin or systemic complaints except as noted in HPI or Assessment and Plan.  Objective  Well appearing patient in no apparent distress; mood and affect are within normal limits.  A full examination was performed including scalp, head, eyes, ears, nose, lips, neck, chest, axillae, abdomen, back, buttocks, bilateral upper extremities, bilateral lower extremities, hands, feet, fingers, toes, fingernails, and toenails. All findings within normal limits unless otherwise noted below.  Left cheek Flesh colored papule  Right cheek x 2, right ear x 2, left ear x 2 (6) Erythematous thin papules/macules with gritty scale.   Left Ear Erythematous keratotic or waxy stuck-on papule or plaque.   Right bicep Blue papule 0.5 cm       Bilateral feet Scale   Assessment & Plan   Lentigines - Scattered tan macules - Due to sun exposure - Benign-appearing, observe - Recommend daily broad spectrum sunscreen SPF 30+ to sun-exposed areas, reapply every 2 hours as needed. - Call for any changes  Seborrheic Keratoses - Stuck-on, waxy, tan-brown papules and/or plaques  - Benign-appearing - Discussed benign etiology and prognosis. - Observe - Call for any changes  Melanocytic Nevi - Tan-brown and/or pink-flesh-colored symmetric macules and papules - Benign appearing on exam today - Observation - Call clinic for new or changing moles - Recommend daily use of broad spectrum spf 30+ sunscreen to sun-exposed areas.    Hemangiomas - Red papules - Discussed benign nature - Observe - Call for any changes  Actinic Damage - Chronic condition, secondary to cumulative UV/sun exposure - diffuse scaly erythematous macules with underlying dyspigmentation - Recommend daily broad spectrum sunscreen SPF 30+ to sun-exposed areas, reapply every 2 hours as needed.  - Staying in the shade or wearing long sleeves, sun glasses (UVA+UVB protection) and wide brim hats (4-inch brim around the entire circumference of the hat) are also recommended for sun protection.  - Call for new or changing lesions.  Skin cancer screening performed today.  Purpura - Chronic; persistent and recurrent.  Treatable, but not curable. - Violaceous macules and patches - Benign - Related to trauma, age, sun damage and/or use of blood thinners, chronic use of topical and/or oral steroids - Observe - Can use OTC arnica containing moisturizer such as Dermend Bruise Formula if desired - Call for worsening or other concerns  History of Skin Cancer   Clear. Observe for recurrence.  Call clinic for new or changing lesions.   Recommend regular skin exams, daily broad-spectrum spf 30+ sunscreen use, and photoprotection.     Epidermal inclusion cyst (2) Right Temple; Dorsum of Nose Benign-appearing.  Observation.  Call clinic for new or changing lesions.  Recommend daily use of broad spectrum spf 30+ sunscreen to sun-exposed areas.   Nevus Left cheek Benign appearing. Observe.  AK (actinic keratosis) Right cheek x 2, right ear x 2, left ear x 2 Destruction of lesion - Right cheek x 2, right ear x 2,  left ear x 2 Complexity: simple   Destruction method: cryotherapy   Informed consent: discussed and consent obtained   Timeout:  patient name, date of birth, surgical site, and procedure verified Lesion destroyed using liquid nitrogen: Yes   Region frozen until ice ball extended beyond lesion: Yes   Outcome: patient tolerated procedure  well with no complications   Post-procedure details: wound care instructions given    Inflamed seborrheic keratosis Left Ear  Destruction of lesion - Left Ear Complexity: simple   Destruction method: cryotherapy   Informed consent: discussed and consent obtained   Timeout:  patient name, date of birth, surgical site, and procedure verified Lesion destroyed using liquid nitrogen: Yes   Region frozen until ice ball extended beyond lesion: Yes   Outcome: patient tolerated procedure well with no complications   Post-procedure details: wound care instructions given    Blue nevus Right bicep Blue nevus vs hemangioma - Benign appearing. Observe No change years  Tinea pedis of both feet Bilateral feet Chronic and persistent Start Ketoconazole 2% cream qhs. May continue vaseline daily. ketoconazole (NIZORAL) 2 % cream - Bilateral feet Apply 1 application topically at bedtime.  Skin cancer screening  Return for 6-12 months , Follow up.  I, Ashok Cordia, CMA, am acting as scribe for Sarina Ser, MD . Documentation: I have reviewed the above documentation for accuracy and completeness, and I agree with the above.  Sarina Ser, MD

## 2020-10-11 NOTE — Patient Instructions (Signed)

## 2020-10-13 ENCOUNTER — Encounter: Payer: Self-pay | Admitting: Dermatology

## 2020-10-26 ENCOUNTER — Other Ambulatory Visit (HOSPITAL_COMMUNITY): Payer: Self-pay

## 2020-10-26 ENCOUNTER — Encounter (HOSPITAL_COMMUNITY): Payer: Self-pay

## 2020-10-26 NOTE — Progress Notes (Signed)
Today had a home visit with [name].; He is a very pleasant man to visit with.  He loves to tell stories. He states doing pretty good, would like to do better and do more.  His daughter is his support person, she stays most night with him.  She cooks, clean and does errands for him.  He has everything for daily living.  He is able to do simple things such as dishes, heat foods, dress his self and simple cleaning.  He has a bath lady that comes weekly.  He has no complaints such as chest pain, headaches, dizziness or increased shortness of breath.  He has all his medcications, he knows how to take them and [name] his daughter checks behind him.  He watches low sodium diet and measures his fluids.  He states not bad constipated but still has some small round stool some days.  He states urinating good.  Edema is down in extremities, abdomen is soft.  Lungs are clear.  His oxygen is set on 3 lpm 24 hour a day.  He stats does leg exercises and walks throughout the home.  Sleep is still a problems at night but takes naps during the day.  Will continue to visit for heart failure, diet and medication compliance.   [place] [phone]

## 2020-11-01 ENCOUNTER — Other Ambulatory Visit: Payer: Medicare Other | Admitting: Nurse Practitioner

## 2020-11-01 ENCOUNTER — Other Ambulatory Visit: Payer: Self-pay

## 2020-11-01 ENCOUNTER — Encounter: Payer: Self-pay | Admitting: Nurse Practitioner

## 2020-11-01 DIAGNOSIS — R0602 Shortness of breath: Secondary | ICD-10-CM

## 2020-11-01 DIAGNOSIS — Z515 Encounter for palliative care: Secondary | ICD-10-CM

## 2020-11-01 NOTE — Progress Notes (Signed)
[facility] Consult Note Telephone: [phone]  Fax: [phone]    Date of encounter: 11/01/20 4:16 PM PATIENT NAME: [name] [place] [id number]   [phone] (home)  DOB: 1928-11-28 MRN: [id number] PRIMARY CARE PROVIDER:    Baxter Hire, MD,  [place] [address] [phone] RESPONSIBLE PARTY:    Contact Information     Name Relation Home Work Mobile   Clif, Serio Daughter   [phone]      I met face to face with patient in home. Palliative Care was asked to follow this patient by consultation request of  Baxter Hire, MD to address advance care planning and complex medical decision making. This is a follow up visit.                                  ASSESSMENT AND PLAN / RECOMMENDATIONS:  Symptom Management/Plan: 1. ACP: wishes for DNR; in vynca;   2. Shortness of breath, stable, continue to monitor respiratory status; O2, nebulizers, pulmonary toileting   3. CHF stable, reviewed continue with community paramedic program, daily weights, monitoring edema; continue to encourage to elevate legs   4. COPD stable; reviewed continue with O2, inhalation therapy, monitoring respiratory status, pulmonary toileting  Follow up Palliative Care Visit: Palliative care will continue to follow for complex medical decision making, advance care planning, and clarification of goals. Return 8 weeks or prn.  I spent 47 minutes providing this consultation. More than 50% of the time in this consultation was spent in counseling and care coordination. PPS:  50%  HOSPICE ELIGIBILITY/DIAGNOSIS: TBD  Chief Complaint: Follow up palliative consult for complex medical decision making   HISTORY OF PRESENT ILLNESS:  [name] is a 87 y.o. year old male  with multiple medical problems including COPD, O2 dependence, Coronary artery disease, atrial fibrillation, chronic  diastolic congestive heart failure, hypertension, hyperlipidemia, history of covid-19 renal bypass surgery. I called [name], Christopher. Cu daughter to confirm pc visit and covid screening negative. [name] was in agreement. I visited [name] in his home. [name] endorses he has been doing well. [name] endorses he has been resting when he needs to rest. [name] endorses he has remained on O2_0 /[place]. [name] endorses he has not been able to wean his O2. We talked about chronic disease progression. We talked about recent wound to his toe which has almost healed. We talked about sleeping patterns, sleep hygiene. [name] endorses he was not able to sleep last night which has been the first time in a long time. We talked about nutrition, his appetite, no significant weight gain or loss. We talked about [name] functional abilities, no recent falls. [name] endorses he does not go outside anymore as he becomes short of breath unless he has to go to MD appointments then uses a w/c. We talked about quality of life. [name] endorses it does not bother him not to be able to do things like he used to. Medical goals reviewed. We talked about role pc in poc, discussed f/u pc visit. Therapeutic listening, emotional support provided, questions answered. No new changes recommended.   History obtained from review of EMR, discussion with [name].  I reviewed available labs, medications, imaging, studies and related documents from the EMR.  Records reviewed and summarized above.   ROS Full  10 system review of systems performed and negative with exception of: as per HPI.   Physical Exam: Constitutional: NAD General: frail appearing, thin, pleasant chronically ill male EYES: lids intact ENMT: oral mucous membranes moist CV: S1S2, RRR Pulmonary: LCTA, no increased work of breathing, no cough, O2 Abdomen: normo-active BS + 4 quadrants, soft and non tender MSK:  ambulatory Skin: warm and dry Neuro:  + generalized weakness,  no cognitive impairment Psych: non-anxious affect, A and O x 3  Questions and concerns were addressed. The patient/family was encouraged to call with questions and/or concerns. My business card was provided. Provided general support and encouragement, no other unmet needs identified   Thank you for the opportunity to participate in the care of [name].  The palliative care team will continue to follow. Please call our office at [phone] if we can be of additional assistance.   This chart was dictated using voice recognition software.  Despite best efforts to proofread,  errors can occur which can change the documentation meaning.   Christin Z Gusler, NP   COVID-19 PATIENT SCREENING TOOL Asked and negative response unless otherwise noted:   Have you had symptoms of covid, tested positive or been in contact with someone with symptoms/positive test in the past 5-10 days?  NO

## 2020-11-22 ENCOUNTER — Other Ambulatory Visit (HOSPITAL_COMMUNITY): Payer: Self-pay

## 2020-11-23 ENCOUNTER — Encounter (HOSPITAL_COMMUNITY): Payer: Self-pay

## 2020-11-23 NOTE — Progress Notes (Signed)
Had a home visit with Easter.  He appears to be doing well.  He has accepted his limitations and mood is good.  He stays home except for going to doctors.  His niece is visiting on sundays and he really enjoys her visits.  Appetite is good.  His daughter stays most nights with him.  He is doing simple chores around the house such as dished and laundry.  He is not exercising like he was.  He is not getting short of breath walking in the home like he was.  He is not coughing as much.  He states phlegm is not as thick in the mornings.  He is still wearing 3 lpm oxygen at all times.  Appetite is good, he eats 3 meals a day and has 1 snack.  None are big meals.  He is drinking more water now.  He states bowel movement are better and urinating good.  He has all his medications and aware of how to take them.  His daughter places them in a container for him.  He has everything for daily living.  He has no complaints today such as chest pain, headaches, dizziness or increased shortness of breath.  Left leg is darker than right, he states injured it but when spoke to his daughter she has noticed also.  He has appt with his foot doctor coming up and she will mention to them.  He has vascular appt end of November and possibly needs to move the appt up.  He has no edema in left leg and some slightly is right ankle and leg. Lungs were clear everywhere except small rhonchi in lower left. He states wants to go back wearing is shoes instead of sandals, he will ask the doctor this week if he can.  He has a small sore they are watching on a toe to make sure is healing.  He is a joy to visit with.  Will continue to visit for heart failure, diet and medication management.    [place] [phone]

## 2020-12-25 ENCOUNTER — Telehealth (HOSPITAL_COMMUNITY): Payer: Self-pay

## 2020-12-26 ENCOUNTER — Ambulatory Visit (INDEPENDENT_AMBULATORY_CARE_PROVIDER_SITE_OTHER): Payer: Medicare Other | Admitting: Nurse Practitioner

## 2020-12-26 ENCOUNTER — Ambulatory Visit (INDEPENDENT_AMBULATORY_CARE_PROVIDER_SITE_OTHER): Payer: Medicare Other

## 2020-12-26 ENCOUNTER — Other Ambulatory Visit: Payer: Self-pay

## 2020-12-26 ENCOUNTER — Encounter (INDEPENDENT_AMBULATORY_CARE_PROVIDER_SITE_OTHER): Payer: Self-pay | Admitting: Nurse Practitioner

## 2020-12-26 VITALS — BP 118/58 | HR 45 | Resp 12 | Ht 70.0 in | Wt 143.0 lb

## 2020-12-26 DIAGNOSIS — I10 Essential (primary) hypertension: Secondary | ICD-10-CM

## 2020-12-26 DIAGNOSIS — L97909 Non-pressure chronic ulcer of unspecified part of unspecified lower leg with unspecified severity: Secondary | ICD-10-CM

## 2020-12-26 DIAGNOSIS — E785 Hyperlipidemia, unspecified: Secondary | ICD-10-CM | POA: Diagnosis not present

## 2020-12-26 DIAGNOSIS — I70299 Other atherosclerosis of native arteries of extremities, unspecified extremity: Secondary | ICD-10-CM

## 2020-12-26 NOTE — Telephone Encounter (Signed)
Today contacted Lauro Franklin daughter about a visit.  She said he had several appts so we scheduled a home visit for next week.  She said he is getting around good.  He has everything he needs for daily living.  He has all his medications and aware of how to take them.  [name] lays them out for him.  She stays most nights with him and on weekends.  She is his caretaker. They are aware of up coming appts.  He watches high sodium foods and fluids.  He mostly eats cooked food at home.  She states no weight gain and edema has been good. Will continue to visit for heart failure, diet and medication management.   [place] [phone]

## 2020-12-27 ENCOUNTER — Other Ambulatory Visit: Payer: Medicare Other | Admitting: Nurse Practitioner

## 2020-12-27 ENCOUNTER — Telehealth: Payer: Self-pay | Admitting: Nurse Practitioner

## 2020-12-27 NOTE — Telephone Encounter (Signed)
I called [name], Mr. Shin daughter to confirm pc visit for today. [name] endorses Mr Garner is doing well, osteomyelitis toe almost healed. They are trying to limit visitors and since Mr. Seiden doing well would like to reschedule further out. Rescheduled.

## 2020-12-31 ENCOUNTER — Encounter (INDEPENDENT_AMBULATORY_CARE_PROVIDER_SITE_OTHER): Payer: Self-pay | Admitting: Nurse Practitioner

## 2020-12-31 NOTE — Progress Notes (Signed)
Subjective:    Patient ID: [name], male    DOB: 08/14/1928, 87 y.o.   MRN: [id number] Chief Complaint  Patient presents with   Follow-up    ultrasound    The patient returns to the office for followup and review of the noninvasive studies. There have been no interval changes in lower extremity symptoms. No interval shortening of the patient's claudication distance or development of rest pain symptoms. No new ulcers or wounds have occurred since the last visit.  Right great toe ulcer has essentially healed  There have been no significant changes to the patient's overall health care.  The patient denies amaurosis fugax or recent TIA symptoms. There are no recent neurological changes noted. The patient denies history of DVT, PE or superficial thrombophlebitis. The patient denies recent episodes of angina or shortness of breath.   ABI Rt=0.90 and Lt=1.11  (previous ABI's Rt=[name] and Lt=[place]) Duplex ultrasound of the bilateral tibial arteries reveals monophasic waveforms with slightly dampened toe waveforms.   Review of Systems  Musculoskeletal:  Positive for gait problem.  Skin:  Negative for wound.  Neurological:  Positive for weakness.  All other systems reviewed and are negative.     Objective:   Physical Exam Vitals reviewed.  HENT:     Head: Normocephalic.  Cardiovascular:     Rate and Rhythm: Normal rate.     Pulses:          Dorsalis pedis pulses are detected w/ Doppler on the right side and detected w/ Doppler on the left side.       Posterior tibial pulses are detected w/ Doppler on the right side and detected w/ Doppler on the left side.     Comments: Home O2 Pulmonary:     Effort: Pulmonary effort is normal.  Skin:    General: Skin is warm and dry.  Neurological:     Mental Status: He is alert and oriented to person, place, and time.     Motor: Weakness present.  Psychiatric:        Mood and Affect: Mood normal.        Behavior: Behavior normal.         Thought Content: Thought content normal.        Judgment: Judgment normal.    BP (!) 118/58 (BP Location: Right Arm)   Pulse (!) 45   Resp 12   Ht 5\' 10"  (1.778 m)   Wt 143 lb (64.9 kg)   BMI 20.52 kg/m   Past Medical History:  Diagnosis Date   (HFpEF) heart failure with preserved ejection fraction (HCC)    Aortic atherosclerosis (HCC)    Cardiac murmur    Grade I/VI medium pitched mid systolic blowing at lower LSB   COPD (chronic obstructive pulmonary disease) (HCC)    Coronary artery disease    Dependence on supplemental oxygen    HLD (hyperlipidemia)    Hypertension    Paroxysmal atrial fibrillation (HCC)    PVD (peripheral vascular disease) (HCC)    Skin cancer    nose and ear   Thoracic aortic ectasia (HCC)    a.) measured 3.6 x 3.3 cm on 10/08/2019    Social History   Socioeconomic History   Marital status: Married    Spouse name: Not on file   Number of children: Not on file   Years of education: Not on file   Highest education level: Not on file  Occupational History   Not on file  Tobacco Use   Smoking status: Former    Packs/day: 1.00    Years: 30.00    Pack years: 30.00    Types: Cigarettes    Quit date: 1997    Years since quitting: 25.9   Smokeless tobacco: Never  Vaping Use   Vaping Use: Never used  Substance and Sexual Activity   Alcohol use: Never   Drug use: Never   Sexual activity: Not Currently  Other Topics Concern   Not on file  Social History Narrative   Lives at home alone with help from daughter.   Social Determinants of Health   Financial Resource Strain: Not on file  Food Insecurity: Not on file  Transportation Needs: Not on file  Physical Activity: Not on file  Stress: Not on file  Social Connections: Not on file  Intimate Partner Violence: Not on file    Past Surgical History:  Procedure Laterality Date   COLONOSCOPY  2007   ENDARTERECTOMY FEMORAL Right 08/09/2020   Procedure: RIGHT COMMON AND PROFUNDA FEMORAL  ENDARTERECTOMY;  Surgeon: Algernon Huxley, MD;  Location: [facility];  Service: Vascular;  Laterality: Right;   EYE SURGERY Bilateral 2005   LOWER EXTREMITY ANGIOGRAPHY Right 07/05/2020   Procedure: LOWER EXTREMITY ANGIOGRAPHY;  Surgeon: Algernon Huxley, MD;  Location: [facility];  Service: Cardiovascular;  Laterality: Right;   Renal Bypass Surgery Bilateral 2000    History reviewed. No pertinent family history.  No Known Allergies  CBC Latest Ref Rng & Units 08/11/2020 08/10/2020 08/09/2020  WBC 4.0 - 10.5 K/uL 9.7 9.3 6.9  Hemoglobin 13.0 - 17.0 g/dL 9.6(L) 9.2(L) 9.6(L)  Hematocrit 39.0 - 52.0 % 30.3(L) 28.2(L) 29.5(L)  Platelets 150 - 400 K/uL 290 230 237      CMP     Component Value Date/Time   NA 137 08/11/2020 0527   NA 140 02/02/2014 0356   K 4.7 08/11/2020 0527   K 3.7 02/02/2014 0356   CL 104 08/11/2020 0527   CL 104 02/02/2014 0356   CO2 29 08/11/2020 0527   CO2 29 02/02/2014 0356   GLUCOSE 101 (H) 08/11/2020 0527   GLUCOSE 160 (H) 02/02/2014 0356   BUN 31 (H) 08/11/2020 0527   BUN 33 (H) 02/02/2014 0356   CREATININE 0.94 08/11/2020 0527   CREATININE 0.86 02/02/2014 0356   CALCIUM 9.2 08/11/2020 0527   CALCIUM 8.6 10/10/2019 0305   PROT 7.6 10/08/2019 1150   PROT 7.5 01/29/2014 0949   ALBUMIN 4.0 10/08/2019 1150   ALBUMIN 3.1 (L) 01/29/2014 0949   AST 19 10/08/2019 1150   AST 39 (H) 01/29/2014 0949   ALT 14 10/08/2019 1150   ALT 27 01/29/2014 0949   ALKPHOS 66 10/08/2019 1150   ALKPHOS 79 01/29/2014 0949   BILITOT 1.1 10/08/2019 1150   BILITOT 0.7 01/29/2014 0949   GFRNONAA >60 08/11/2020 0527   GFRNONAA >60 02/02/2014 0356   GFRNONAA >60 06/25/2011 1325   GFRAA >60 10/15/2019 0634   GFRAA >60 02/02/2014 0356   GFRAA >60 06/25/2011 1325     VAS [place] ABI WITH/WO TBI  Result Date: 12/26/2020  LOWER EXTREMITY DOPPLER STUDY Patient Name:  [name]  Date of Exam:   12/26/2020 Medical Rec #: [id number]          Accession #:    [id number] Date  of Birth: [birth year]          Patient Gender: M Patient Age:   14 years Exam Location:  [facility] Procedure:      VAS [place] ABI WITH/WO TBI Referring Phys: --------------------------------------------------------------------------------  Indications: Peripheral artery disease, and rt great toe wound x 4 mths.  Performing Technologist: Concha Norway RVT  Examination Guidelines: A complete evaluation includes at minimum, Doppler waveform signals and systolic blood pressure reading at the level of bilateral brachial, anterior tibial, and posterior tibial arteries, when vessel segments are accessible. Bilateral testing is considered an integral part of a complete examination. Photoelectric Plethysmograph (PPG) waveforms and toe systolic pressure readings are included as required and additional duplex testing as needed. Limited examinations for reoccurring indications may be performed as noted.  ABI Findings: +---------+------------------+-----+----------+--------+ Right    Rt Pressure (mmHg)IndexWaveform  Comment  +---------+------------------+-----+----------+--------+ Brachial 157                                       +---------+------------------+-----+----------+--------+ ATA      142                    monophasic.90      +---------+------------------+-----+----------+--------+ PTA      118               0.75 monophasic         +---------+------------------+-----+----------+--------+ Great Toe71                0.45 Abnormal           +---------+------------------+-----+----------+--------+ +---------+------------------+-----+----------+-------+ Left     Lt Pressure (mmHg)IndexWaveform  Comment +---------+------------------+-----+----------+-------+ ATA      109                    monophasic.69     +---------+------------------+-----+----------+-------+ PTA      174               1.11 monophasic        +---------+------------------+-----+----------+-------+  Great Toe78                0.50 Abnormal          +---------+------------------+-----+----------+-------+ +-------+-----------+-----------+------------+------------+ ABI/TBIToday's ABIToday's TBIPrevious ABIPrevious TBI +-------+-----------+-----------+------------+------------+ Right  .90        .35        [place]          .68          +-------+-----------+-----------+------------+------------+ Left   1.11       .50        [place]          .49          +-------+-----------+-----------+------------+------------+ Bilateral ABIs appear essentially unchanged compared to prior study on 09/26/2020.  Summary: Right: Resting right ankle-brachial index indicates mild right lower extremity arterial disease. The right toe-brachial index is abnormal. Left: Resting left ankle-brachial index is within normal range. No evidence of significant left lower extremity arterial disease. The left toe-brachial index is abnormal.  *See table(s) above for measurements and observations.  Electronically signed by [name] MD on 12/26/2020 at 11:11:15 AM.    Final        Assessment & Plan:   1. Atherosclerosis of artery of extremity with ulceration (HCC) Right great toe wound is essentially healed at this time.  There are no other wounds or ulcerations.  Noninvasive studies today shows that he should have adequate perfusion for continued healing however we will maintain close follow-up and have him return for noninvasive studies in 3 months.  Patient will  also advised to follow-up sooner if there are issues.  2. Primary hypertension Continue antihypertensive medications as already ordered, these medications have been reviewed and there are no changes at this time.   3. Hyperlipidemia, unspecified hyperlipidemia type Continue statin as ordered and reviewed, no changes at this time    Current Outpatient Medications on File Prior to Visit  Medication Sig Dispense Refill   Acetaminophen 500 MG capsule Take by  mouth.     albuterol (PROVENTIL) (2.5 MG/3ML) 0.083% nebulizer solution Take 3 mLs (2.5 mg total) by nebulization every 6 (six) hours as needed for wheezing or shortness of breath. 75 mL 0   alendronate (FOSAMAX) 70 MG tablet Take 70 mg by mouth once a week. Sunday morning with full glass of water and sit up do not lay down     apixaban (ELIQUIS) 2.5 MG TABS tablet Take 2.5 mg by mouth 2 (two) times daily.     ascorbic acid (VITAMIN C) 250 MG tablet Take 1 tablet (250 mg total) by mouth 2 (two) times daily. 60 tablet 6   aspirin EC 81 MG EC tablet Take 1 tablet (81 mg total) by mouth daily at 6 (six) AM. Swallow whole. 90 tablet 3   atorvastatin (LIPITOR) 20 MG tablet Take 1 tablet (20 mg total) by mouth daily. 90 tablet 3   Calcium Carb-Cholecalciferol (CALCIUM 600/VITAMIN D PO) Take by mouth.     CARTIA XT 300 MG 24 hr capsule Take 300 mg by mouth daily.     diltiazem (TIAZAC) 300 MG 24 hr capsule Take by mouth.     feeding supplement (ENSURE ENLIVE / ENSURE PLUS) LIQD Take 237 mLs by mouth 2 (two) times daily between meals. 237 mL 12   Fluticasone-Umeclidin-Vilant (TRELEGY ELLIPTA) 100-62.5-25 MCG/INH AEPB Inhale into the lungs.     furosemide (LASIX) 20 MG tablet TAKE 1 TABLET BY MOUTH DAILY (Patient taking differently: 20 mg. Two tablets M_W_F and 1 tablet Tues, Thurs, Sat and Sun) 30 tablet 5   ketoconazole (NIZORAL) 2 % cream Apply 1 application topically at bedtime. 60 g 11   metoprolol succinate (TOPROL-XL) 25 MG 24 hr tablet Take 1 tablet by mouth daily.     metoprolol tartrate (LOPRESSOR) 50 MG tablet Take 50 mg by mouth 2 (two) times daily.     Multiple Vitamin (MULTIVITAMIN WITH MINERALS) TABS tablet Take 1 tablet by mouth daily. 90 tablet 3   mupirocin ointment (BACTROBAN) 2 % 1 application daily.     oxyCODONE-acetaminophen (PERCOCET/ROXICET) 5-325 MG tablet Take 1 tablet by mouth every 8 (eight) hours as needed for moderate pain. 20 tablet 0   potassium chloride (KLOR-CON) 10 MEQ  tablet Take 2 tablets (20 mEq total) by mouth daily. (Patient taking differently: Take 20 mEq by mouth daily. 2 tablets M-W-F 1 tablet Tues, thurs, Sat and Sun) 60 tablet 5   predniSONE (DELTASONE) 10 MG tablet Take 5 mg by mouth daily with breakfast.     senna-docusate (SENOKOT-S) 8.6-50 MG tablet Take 1 tablet by mouth daily.     Vitamin D, Ergocalciferol, (DRISDOL) 1.25 MG (50000 UNIT) CAPS capsule Take 1 capsule (50,000 Units total) by mouth every 7 (seven) days. 5 capsule 0   Wheat Dextrin (BENEFIBER PO) Take by mouth. 2 teaspoons twice a day     traZODone (DESYREL) 50 MG tablet Take 50 mg by mouth at bedtime as needed for sleep.  (Patient not taking: No sig reported)     No current facility-administered medications on file  prior to visit.    There are no Patient Instructions on file for this visit. No follow-ups on file.   Kris Hartmann, NP

## 2021-01-19 ENCOUNTER — Other Ambulatory Visit (HOSPITAL_COMMUNITY): Payer: Self-pay

## 2021-01-24 NOTE — Progress Notes (Signed)
Spoke with [name]'s daughter.  She states he has been Licensed conveyancer well.  They both are very cautious about being around anyone where they may get sick.  He has everything for daily living.  He has kept power this past week with the cold temps.  She stays most night with him and helps prepare food up in advance.  She takes him to his appts and set his appts up.  He has all his medications and she picks them up for him.  She helps to organize them but or the most part he takes them daily without assistance.  She has palliative care involved.  She is limiting visitors at this time with flu and covid at increase right now.  Will try to make a home visit in January.  Will continue to visit for heart failure, diet and medicaiton compliance.   [place] [phone]

## 2021-02-08 ENCOUNTER — Other Ambulatory Visit (HOSPITAL_COMMUNITY): Payer: Self-pay

## 2021-02-08 ENCOUNTER — Encounter (HOSPITAL_COMMUNITY): Payer: Self-pay

## 2021-02-08 NOTE — Progress Notes (Signed)
Today had a home visit with [place].  He states been doing well.  He has been staying by hisself more lately due to his daughter having some problems she needs to take care of.  He states is able to fix him sandwiches and do laundry without getting short of breath.  He states able to do light house keeping.  He states still on his 3 lpm oxygen.  Coughing has been getting less and coughing up less.  He has all his medications and aware of how to take them.  He states been having regular bowel movements.  He has gain a few lbs ut due to eating more through the holidays.  His niece is still visiting on Sundays and he loves the attention she gives him and the desserts she brings.  He states drinking enough water during the day.  He watches high sodium foods.  He has everything for daily living.  His daughter keeps up with appts and takes him to them.  He did get his flu shot, he stays at home and no one visits without a mask.  He still has palliative Care visiting. Lungs are clear and has very little swelling in lower extremities.  Will continue to visit for heart failure, diet and medication management.   [place] [phone]

## 2021-02-22 ENCOUNTER — Telehealth: Payer: Self-pay

## 2021-02-22 NOTE — Telephone Encounter (Signed)
136 pm.  Phone call made to patient to complete a telephonic check in and offer a home visit.  No answer.  Message left on VM requesting a call back.

## 2021-02-27 ENCOUNTER — Other Ambulatory Visit: Payer: Self-pay

## 2021-02-27 ENCOUNTER — Encounter: Payer: Self-pay | Admitting: Emergency Medicine

## 2021-02-27 ENCOUNTER — Emergency Department
Admission: EM | Admit: 2021-02-27 | Discharge: 2021-02-27 | Disposition: A | Discharge status: Home / Self Care | Payer: Medicare Other | Attending: Emergency Medicine | Admitting: Emergency Medicine

## 2021-02-27 ENCOUNTER — Other Ambulatory Visit: Payer: Medicare Other | Admitting: Nurse Practitioner

## 2021-02-27 DIAGNOSIS — S61011A Laceration without foreign body of right thumb without damage to nail, initial encounter: Secondary | ICD-10-CM

## 2021-02-27 DIAGNOSIS — W010XXA Fall on same level from slipping, tripping and stumbling without subsequent striking against object, initial encounter: Secondary | ICD-10-CM | POA: Insufficient documentation

## 2021-02-27 DIAGNOSIS — Z23 Encounter for immunization: Secondary | ICD-10-CM | POA: Diagnosis not present

## 2021-02-27 DIAGNOSIS — S61019A Laceration without foreign body of unspecified thumb without damage to nail, initial encounter: Secondary | ICD-10-CM | POA: Insufficient documentation

## 2021-02-27 DIAGNOSIS — S6991XA Unspecified injury of right wrist, hand and finger(s), initial encounter: Secondary | ICD-10-CM | POA: Diagnosis present

## 2021-02-27 MED ORDER — LIDOCAINE HCL (PF) 1 % IJ SOLN
5.0000 mL | Freq: Once | INTRAMUSCULAR | Status: AC
  Administered 2021-02-27: 5 mL via INTRADERMAL
  Filled 2021-02-27: qty 5

## 2021-02-27 MED ORDER — TETANUS-DIPHTH-ACELL PERTUSSIS 5-2.5-18.5 LF-MCG/0.5 IM SUSY
0.5000 mL | PREFILLED_SYRINGE | Freq: Once | INTRAMUSCULAR | Status: AC
  Administered 2021-02-27: 0.5 mL via INTRAMUSCULAR
  Filled 2021-02-27: qty 0.5

## 2021-02-27 NOTE — ED Provider Notes (Signed)
[facility] Provider Note    Event Date/Time   First MD Initiated Contact with Patient 02/27/21 [phone]     (approximate)   History   Laceration   HPI  [name] is a 88 y.o. male presents emergency department complaining of a laceration to the right thumb.  Patient states he fell last night and hurt his right thumb.  States area was bleeding.  Fire department came out helped him up he was able to stand without any difficulty.  States he does not think he broke anything.  Just having bleeding that he cannot stop due to the blood thinner.  Unsure of his last Tdap.      Physical Exam   Triage Vital Signs: ED Triage Vitals  Enc Vitals Group     BP 02/27/21 0832 (!) 141/68     Pulse Rate 02/27/21 0832 83     Resp 02/27/21 0832 16     Temp 02/27/21 0832 98.4 F (36.9 C)     Temp Source 02/27/21 0832 Oral     SpO2 02/27/21 0832 98 %     Weight 02/27/21 0833 144 lb (65.3 kg)     Height 02/27/21 0833 5\' 9"  (1.753 m)     Head Circumference --      Peak Flow --      Pain Score 02/27/21 0833 5     Pain Loc --      Pain Edu? --      Excl. in [place]? --     Most recent vital signs: Vitals:   02/27/21 0832  BP: (!) 141/68  Pulse: 83  Resp: 16  Temp: 98.4 F (36.9 C)  SpO2: 98%     General: Awake, no distress.   CV:  Good peripheral perfusion.  Resp:  Normal effort.  Abd:  No distention.   Other:  Right thumb has a laceration that is approximately 2 to 3 cm with active bleeding noted, area is superficial although there is some frank bleeding.  No bony tenderness noted.  Neurovascular is intact   ED Results / Procedures / Treatments   Labs (all labs ordered are listed, but only abnormal results are displayed) Labs Reviewed - No data to display   EKG     RADIOLOGY     PROCEDURES:   .Marland KitchenLaceration Repair  Date/Time: 02/27/2021 9:19 AM Performed by: Versie Starks, PA-C Authorized by: Versie Starks, PA-C   Consent:     Consent obtained:  Verbal   Consent given by:  Patient   Risks discussed:  Infection, pain, retained foreign body, tendon damage, poor cosmetic result, need for additional repair, nerve damage, poor wound healing and vascular damage   Alternatives discussed:  No treatment Universal protocol:    Procedure explained and questions answered to patient or proxy's satisfaction: yes     Patient identity confirmed:  Verbally with patient Anesthesia:    Anesthesia method:  Local infiltration   Local anesthetic:  Lidocaine 1% w/o epi Laceration details:    Location:  Finger   Finger location:  R thumb   Length (cm):  2.5 Pre-procedure details:    Preparation:  Patient was prepped and draped in usual sterile fashion Exploration:    Limited defect created (wound extended): no     Hemostasis achieved with:  Direct pressure   Imaging outcome: foreign body not noted     Wound exploration: wound explored through full range of motion     Wound  extent: no areolar tissue violation noted, no fascia violation noted, no foreign bodies/material noted, no muscle damage noted, no nerve damage noted, no tendon damage noted, no underlying fracture noted and no vascular damage noted     Contaminated: no   Treatment:    Area cleansed with:  Povidone-iodine and saline   Amount of cleaning:  Standard   Irrigation solution:  Sterile saline   Irrigation method:  Tap Skin repair:    Repair method:  Sutures   Suture size:  5-0   Suture material:  Nylon   Suture technique:  Simple interrupted   Number of sutures:  6 Approximation:    Approximation:  Close Repair type:    Repair type:  Simple Post-procedure details:    Dressing:  Non-adherent dressing   Procedure completion:  Tolerated well, no immediate complications   MEDICATIONS ORDERED IN ED: Medications  Tdap (BOOSTRIX) injection 0.5 mL (0.5 mLs Intramuscular Given 02/27/21 0852)  lidocaine (PF) (XYLOCAINE) 1 % injection 5 mL (5 mLs Intradermal Given by  Other 02/27/21 0856)     IMPRESSION / MDM / ASSESSMENT AND PLAN / ED COURSE  I reviewed the triage vital signs and the nursing notes.                             The patient is a 88 year old male presents for laceration to the right thumb.  No fracture as there is no bony tenderness.  Patient will have sutures to stop the active bleeding.  He was updated with his Tdap.  See procedure note for laceration repair.  Patient tolerated procedure well.  He is to follow-up with his regular doctor in 7 to 10 days for suture removal.  Keep the area as dry as possible.  You may wash it with soap and water after 24 hours.  Patient is in agreement treatment plan.  Discharged stable condition.      FINAL CLINICAL IMPRESSION(S) / ED DIAGNOSES   Final diagnoses:  Thumb laceration, right, initial encounter     Rx / DC Orders   ED Discharge Orders     None        Note:  This document was prepared using Dragon voice recognition software and may include unintentional dictation errors.    Versie Starks, PA-C 02/27/21 6314    Lavonia Drafts, MD 02/27/21 [phone]

## 2021-02-27 NOTE — ED Triage Notes (Signed)
Pt comes into the ED via EMS from home states he tripped and fell yesterday and has a lac to the right hand that he cant seem to get to stop bleeding. On blood thinners

## 2021-02-27 NOTE — Discharge Instructions (Signed)
Follow up with your regular doctor for suture removal in 10 days Return to the ER if any sign of infection Keep the area covered with a dry bandage, Do Not use neosporin as this will cause the area to be too wet and it will not heal

## 2021-02-28 ENCOUNTER — Ambulatory Visit: Payer: Self-pay | Admitting: *Deleted

## 2021-02-28 NOTE — Telephone Encounter (Signed)
°  Chief Complaint: suture care Symptoms: questions about wound care Frequency:   Pertinent Negatives: Patient denies   Disposition: [] ED /[] Urgent Care (no appt availability in office) / [] Appointment(In office/virtual)/ []  [place] [facility]/ [] Home Care/ [] Refused Recommended Disposition /[] [place] [facility]/ [x]  Follow-up with PCP Additional Notes: Patient 's daughter has questions about wound care verbal instructions verses AVS instructions. Advised to follow verbal instructions given, call PCP for any problems and follow up.

## 2021-02-28 NOTE — Telephone Encounter (Signed)
Summary: ER instructions questions   [name] daughter called in about instructions from ER, she wants to know, should she, wash the area and put more bandaegs on before the 10 days  is up(he was discharged yesterday 01/31)or just one time until he gets stiches out in 4 days and what type of bandages, should she use, dto put baack on it when removing the ones he has on now,should it be nonstick?     Per protocol: CARE OF A SUTURED OR STAPLED WOUND:  * Keep sutured wound dry for first 24 hours (use sponge bath).  * Can get wound wet (but no bathing or swimming) after 24 hours.  * Apply antibiotic ointment (OTC) three times a day. Reason: To prevent infection and thick scab - which increases scarring.  * Cleanse with warm water once daily or if becomes soiled.  * Change wound dressing when wet or soiled.  * Dressing no longer needed when edge of wound closed (usually 48 hours). Exception: Dressing needed to prevent sutures from catching on clothing. Reason for Disposition  Care of sutured (or stapled) wound,  questions about  Answer Assessment - Initial Assessment Questions 1. LOCATION: "Where are the sutures (or staples) located?"      R hand- thumb 2. NUMBER: "How many sutures (or staples) are there?"      6 sutures 3. DATE IN: "When were the sutures (or staples) put in?"       yesterday 4. DATE OUT: "When did your doctor tell you the sutures (or staples) needed to come out?"     10 days 5. OTHER SYMPTOMS: "Do you have any other symptoms?" (e.g., wound pain, discharge, fever?)     no 6. PREGNANCY: "Is there any chance you are pregnant?" "When was your last menstrual period?"     *No Answer*  Protocols used: Suture or Staple Questions-A-AH

## 2021-03-02 ENCOUNTER — Telehealth: Payer: Self-pay

## 2021-03-02 NOTE — Telephone Encounter (Signed)
354 pm.  Phone call made to patient to schedule a follow up visit with [facility] NP.  No answer.  Message left requesting a call back.  Also called daughter [name].  No answer on cell phone.  Unable to leave message as VM is full.

## 2021-03-05 ENCOUNTER — Other Ambulatory Visit (HOSPITAL_COMMUNITY): Payer: Self-pay

## 2021-03-05 ENCOUNTER — Encounter (HOSPITAL_COMMUNITY): Payer: Self-pay

## 2021-03-05 NOTE — Progress Notes (Signed)
His daughter called me this morning stating he was having shortness of breath and weight gain yesterday of 3 lbs.  Today he was down 1 lbs.  Upon arrival [name] was short of breath with exertion.  Once he sat his breathing got better.  He had a fall and since he is sore.  He states sore on his left side from his chest to his lower leg.  He has bruising on leg from above knee to mid calf, no bruising or deformity noted on side of chest area.  He has large bruising on underside of r arm, he states where the first responder grabbed him to help him up.  His lungs are clear upper but lower left has some crackles, right is clear.  He states up his oxygen to 3.5 liters, his pulse ox is 95% sitting.  He is normally on 3 liters and at 99% to 100%.  He states started since the fall.  Slight edema in lower legs and abdomen is soft and non tender.  Contacted Tina with HF clinic and advised.  He took 2 fluid pills today as per his regiman, she states check weight in the morning and see how he is and he could take another dose of 2 fluid pill tomorrow if needed.  Advise him will call in the morning aroun 8am.  He understands the order and will call him tomorrow.  Appetite is good, Denies any chest pain pain, headaches or dizziness.  Gave information to his daughter as well.  They know to call with any problems and Mr [name] is aware to call 911 if need.  Will continue to visit for heart failure, diet and medication compliance.   [place] [phone]

## 2021-03-14 ENCOUNTER — Encounter (HOSPITAL_COMMUNITY): Payer: Self-pay

## 2021-03-14 ENCOUNTER — Other Ambulatory Visit (HOSPITAL_COMMUNITY): Payer: Self-pay

## 2021-03-14 NOTE — Progress Notes (Signed)
Today had a home visit with [name].  He states breathing is better today, he was able to cough up some thick mucous today.  He states upon ambulating today breathing is much better.  He has been taking 40 mg furosemide for past week with not much weight change, still up 5 lbs.  After talking to him he has increased his amount of food he is eating.  Edema is not bad in lower extremities and lungs are clear today.  He has not got his results from chest xray yet from Monday at his PCP.  He has appt at HF clinic on this Friday.  Xray last week showed some vascular congestion, so that is why the increase on furosemide.  He had a fall a few weeks ago and was hurting in his side and unable to take a deep breath when shortness of breath started.  Since he started taking tylenol and pain is better and he has started back on his breathing exercises.  He has all his medications and aware of how to take them.  He still having problems with constipation, contacted Palliative to see what else he may try for this.  He states eating vegetables, drinking enough water and taking his medications for constipation.  He tries his best to do simple chores around the home.  His oxygen is on 4 lpm since last week, he normally on 3 lpm.  He states will watch his breathing and start weaning it back down.  Will continue to visit for heart failure, diet and medication management.   [place] [phone]

## 2021-03-16 ENCOUNTER — Telehealth: Payer: Self-pay | Admitting: Family

## 2021-03-16 ENCOUNTER — Other Ambulatory Visit
Admission: RE | Admit: 2021-03-16 | Discharge: 2021-03-16 | Disposition: A | Discharge status: Home / Self Care | Payer: Medicare Other | Source: Ambulatory Visit | Attending: Family | Admitting: Family

## 2021-03-16 ENCOUNTER — Other Ambulatory Visit: Payer: Self-pay

## 2021-03-16 ENCOUNTER — Encounter: Payer: Self-pay | Admitting: Family

## 2021-03-16 ENCOUNTER — Ambulatory Visit (HOSPITAL_BASED_OUTPATIENT_CLINIC_OR_DEPARTMENT_OTHER): Payer: Medicare Other | Admitting: Family

## 2021-03-16 VITALS — BP 127/59 | HR 65 | Resp 18 | Ht 69.0 in | Wt 148.4 lb

## 2021-03-16 DIAGNOSIS — I5032 Chronic diastolic (congestive) heart failure: Secondary | ICD-10-CM | POA: Insufficient documentation

## 2021-03-16 DIAGNOSIS — R609 Edema, unspecified: Secondary | ICD-10-CM | POA: Diagnosis not present

## 2021-03-16 DIAGNOSIS — Z87891 Personal history of nicotine dependence: Secondary | ICD-10-CM | POA: Insufficient documentation

## 2021-03-16 DIAGNOSIS — Z09 Encounter for follow-up examination after completed treatment for conditions other than malignant neoplasm: Secondary | ICD-10-CM | POA: Diagnosis not present

## 2021-03-16 DIAGNOSIS — J449 Chronic obstructive pulmonary disease, unspecified: Secondary | ICD-10-CM | POA: Insufficient documentation

## 2021-03-16 DIAGNOSIS — I110 Hypertensive heart disease with heart failure: Secondary | ICD-10-CM | POA: Insufficient documentation

## 2021-03-16 DIAGNOSIS — I2510 Atherosclerotic heart disease of native coronary artery without angina pectoris: Secondary | ICD-10-CM | POA: Insufficient documentation

## 2021-03-16 DIAGNOSIS — I10 Essential (primary) hypertension: Secondary | ICD-10-CM

## 2021-03-16 DIAGNOSIS — J42 Unspecified chronic bronchitis: Secondary | ICD-10-CM | POA: Diagnosis not present

## 2021-03-16 DIAGNOSIS — R0602 Shortness of breath: Secondary | ICD-10-CM | POA: Insufficient documentation

## 2021-03-16 DIAGNOSIS — Z9981 Dependence on supplemental oxygen: Secondary | ICD-10-CM | POA: Insufficient documentation

## 2021-03-16 LAB — BASIC METABOLIC PANEL
Anion gap: 8 (ref 5–15)
BUN: 31 mg/dL — ABNORMAL HIGH (ref 8–23)
CO2: 31 mmol/L (ref 22–32)
Calcium: 10.1 mg/dL (ref 8.9–10.3)
Chloride: 96 mmol/L — ABNORMAL LOW (ref 98–111)
Creatinine, Ser: 1.13 mg/dL (ref 0.61–1.24)
GFR, Estimated: >60 mL/min (ref >60)
Glucose, Bld: 96 mg/dL (ref 70–99)
Potassium: 4.4 mmol/L (ref 3.5–5.1)
Sodium: 135 mmol/L (ref 135–145)

## 2021-03-16 MED ORDER — METOLAZONE 2.5 MG PO TABS: 2.5000 mg | ORAL_TABLET | Freq: Every day | ORAL | 0 refills | Status: DC

## 2021-03-16 NOTE — Patient Instructions (Addendum)
Continue weighing daily and call for an overnight weight gain of 3 pounds or more or a weekly weight gain of more than 5 pounds.    Take booster fluid pill (metolazone) tomorrow morning 1/2 hour before your furosemide. Take another one on Sunday morning 1/2 hour before your furosemide. If you have a great response to just 1 tablet, you don't have to take the 2nd one

## 2021-03-16 NOTE — Progress Notes (Signed)
Patient ID: [name], male    DOB: 1928/02/18, 88 y.o.   MRN: [id number]  HPI  [name] is a 88 y/o male with a history of CAD, HTN, previous tobacco use and chronic heart failure.   Echo report from 10/08/19 reviewed and showed an EF of 55-60% along with moderately elevated PA pressure, severe LAE/ RAE and mild Christopher. Echo report from 01/06/2019 reviewed and showed an EF of 55-60% along with mild/ moderate Christopher and moderately elevated PA pressure.   Was in the ED 02/27/21 due to thumb laceration where he was evaluated and released.   He presents today for a follow-up visit although hasn't been seen since December 2021. He presents with a chief complaint of moderate shortness of breath with minimal exertion. He describes this as chronic in nature having been present for several years although does seem to be worse over the last couple of weeks. He has associated pedal edema and weight gain along with this. He denies any difficulty sleeping, abdominal distention, palpitations, chest pain, wheezing, dizziness or fatigue.   Had a CXR done earlier this week but is still waiting to hear the results.   Past Medical History:  Diagnosis Date   (HFpEF) heart failure with preserved ejection fraction (HCC)    Aortic atherosclerosis (HCC)    Cardiac murmur    Grade I/VI medium pitched mid systolic blowing at lower LSB   COPD (chronic obstructive pulmonary disease) (HCC)    Coronary artery disease    Dependence on supplemental oxygen    HLD (hyperlipidemia)    Hypertension    Paroxysmal atrial fibrillation (HCC)    PVD (peripheral vascular disease) (HCC)    Skin cancer    nose and ear   Thoracic aortic ectasia (HCC)    a.) measured 3.6 x 3.3 cm on 10/08/2019   Past Surgical History:  Procedure Laterality Date   COLONOSCOPY  2007   ENDARTERECTOMY FEMORAL Right 08/09/2020   Procedure: RIGHT COMMON AND PROFUNDA FEMORAL ENDARTERECTOMY;  Surgeon: Algernon Huxley, MD;  Location: [facility] OR[facility];   Service: Vascular;  Laterality: Right;   EYE SURGERY Bilateral 2005   LOWER EXTREMITY ANGIOGRAPHY Right 07/05/2020   Procedure: LOWER EXTREMITY ANGIOGRAPHY;  Surgeon: Algernon Huxley, MD;  Location: [facility];  Service: Cardiovascular;  Laterality: Right;   Renal Bypass Surgery Bilateral 2000   No family history on file. Social History   Tobacco Use   Smoking status: Former    Packs/day: 1.00    Years: 30.00    Pack years: 30.00    Types: Cigarettes    Quit date: 1997    Years since quitting: 26.1   Smokeless tobacco: Never  Substance Use Topics   Alcohol use: Never   No Known Allergies  Prior to Admission medications   Medication Sig Start Date End Date Taking? Authorizing Provider  Acetaminophen 500 MG capsule Take by mouth.   Yes [provider]  albuterol (PROVENTIL) (2.5 MG/3ML) 0.083% nebulizer solution Take 3 mLs (2.5 mg total) by nebulization every 6 (six) hours as needed for wheezing or shortness of breath. 01/31/19  Yes Danford, Suann Larry, MD  alendronate (FOSAMAX) 70 MG tablet Take 70 mg by mouth once a week. Sunday morning with full glass of water and sit up do not lay down 06/07/20  Yes [provider]  apixaban (ELIQUIS) 2.5 MG TABS tablet Take 2.5 mg by mouth 2 (two) times daily.   Yes [provider]  ascorbic  acid (VITAMIN C) 250 MG tablet Take 1 tablet (250 mg total) by mouth 2 (two) times daily. 08/11/20  Yes Stegmayer, Janalyn Harder, PA-C  aspirin EC 81 MG EC tablet Take 1 tablet (81 mg total) by mouth daily at 6 (six) AM. Swallow whole. 08/11/20  Yes Stegmayer, Joelene Millin A, PA-C  atorvastatin (LIPITOR) 20 MG tablet Take 1 tablet (20 mg total) by mouth daily. 08/11/20  Yes Stegmayer, Joelene Millin A, PA-C  Calcium Carb-Cholecalciferol (CALCIUM 600/VITAMIN D PO) Take by mouth.   Yes [provider]  CARTIA XT 300 MG 24 hr capsule Take 300 mg by mouth daily. 09/16/19  Yes [provider]  Fluticasone-Umeclidin-Vilant  (TRELEGY ELLIPTA) 100-62.5-25 MCG/INH AEPB Inhale into the lungs.   Yes [provider]  furosemide (LASIX) 20 MG tablet TAKE 1 TABLET BY MOUTH DAILY Patient taking differently: 20 mg. Two tablets M_W_F and 1 tablet Tues, Thurs, Sat and Sun 09/16/19  Yes Yamaira Spinner A, FNP  ketoconazole (NIZORAL) 2 % cream Apply 1 application topically at bedtime. 10/11/20 10/01/22 Yes Ralene Bathe, MD  metolazone (ZAROXOLYN) 2.5 MG tablet Take 1 tablet (2.5 mg total) by mouth daily. Take this 1/2 hour before your morning furosemide. 03/16/21 06/14/21 Yes [name] A, FNP  metoprolol tartrate (LOPRESSOR) 50 MG tablet Take 50 mg by mouth 2 (two) times daily.   Yes [provider]  Multiple Vitamin (MULTIVITAMIN WITH MINERALS) TABS tablet Take 1 tablet by mouth daily. 08/12/20  Yes Stegmayer, Janalyn Harder, PA-C  mupirocin ointment (BACTROBAN) 2 % 1 application daily.   Yes [provider]  oxyCODONE-acetaminophen (PERCOCET/ROXICET) 5-325 MG tablet Take 1 tablet by mouth every 8 (eight) hours as needed for moderate pain. 08/11/20  Yes Stegmayer, Joelene Millin A, PA-C  potassium chloride (KLOR-CON) 10 MEQ tablet Take 2 tablets (20 mEq total) by mouth daily. Patient taking differently: Take 20 mEq by mouth daily. 2 tablets M-W-F 1 tablet Tues, thurs, Sat and Sun 01/14/20  Yes Dennies Coate, [name] A, FNP  predniSONE (DELTASONE) 10 MG tablet Take 5 mg by mouth daily with breakfast.   Yes [provider]  senna-docusate (SENOKOT-S) 8.6-50 MG tablet Take 1 tablet by mouth daily.   Yes [provider]  Vitamin D, Ergocalciferol, (DRISDOL) 1.25 MG (50000 UNIT) CAPS capsule Take 1 capsule (50,000 Units total) by mouth every 7 (seven) days. 10/15/19  Yes Wouk, Ailene Rud, MD  Wheat Dextrin (BENEFIBER PO) Take by mouth. 2 teaspoons twice a day   Yes [provider]  metoprolol succinate (TOPROL-XL) 25 MG 24 hr tablet Take 1 tablet by mouth daily. Patient not taking: Reported on 03/16/2021  05/08/20 05/08/21  [provider]  traZODone (DESYREL) 50 MG tablet Take 50 mg by mouth at bedtime as needed for sleep. 11/18/18 11/18/19  [provider]   Review of Systems  Constitutional:  Negative for appetite change and fatigue.  HENT:  Negative for congestion, postnasal drip and sore throat.   Eyes: Negative.   Respiratory:  Positive for cough and shortness of breath (with minimal exertion). Negative for wheezing.   Cardiovascular:  Positive for leg swelling. Negative for chest pain and palpitations.  Gastrointestinal:  Negative for abdominal distention and abdominal pain.  Endocrine: Negative.   Genitourinary: Negative.   Musculoskeletal:  Positive for arthralgias (left side due to recent fall). Negative for back pain.  Skin: Negative.   Allergic/Immunologic: Negative.   Neurological:  Negative for dizziness and light-headedness.  Hematological:  Negative for adenopathy. Does not bruise/bleed easily.  Psychiatric/Behavioral:  Negative for dysphoric mood and sleep disturbance (sleeping on 2 pillows). The patient is not nervous/anxious.    Vitals:   03/16/21 1104  BP: (!) 127/59  Pulse: 65  Resp: 18  SpO2: 100%  Weight: 148 lb 7 oz (67.3 kg)  Height: 5\' 9"  (1.753 m)   Wt Readings from Last 3 Encounters:  03/16/21 148 lb 7 oz (67.3 kg)  03/14/21 146 lb 9.6 oz (66.5 kg)  03/05/21 146 lb (66.2 kg)   Lab Results  Component Value Date   CREATININE 0.94 08/11/2020   CREATININE 0.89 08/10/2020   CREATININE 0.98 08/09/2020    Physical Exam Vitals and nursing note reviewed. Exam conducted with a chaperone present (daughter).  Constitutional:      Appearance: He is well-developed.  HENT:     Head: Normocephalic and atraumatic.  Neck:     Vascular: No JVD.  Cardiovascular:     Rate and Rhythm: Normal rate and regular rhythm.  Pulmonary:     Effort: Pulmonary effort is normal. No respiratory distress.     Breath sounds: No wheezing or rales.  Abdominal:      Palpations: Abdomen is soft.     Tenderness: There is no abdominal tenderness.  Musculoskeletal:        General: No tenderness.     Cervical back: Normal range of motion and neck supple.     Right lower leg: No tenderness. Edema (1+ pitting) present.     Left lower leg: No tenderness. Edema (1+ pitting) present.  Skin:    General: Skin is warm and dry.  Neurological:     General: No focal deficit present.     Mental Status: He is alert and oriented to person, place, and time.  Psychiatric:        Mood and Affect: Mood normal.        Behavior: Behavior normal.   Assessment & Plan:  1: Chronic heart failure with preserved ejection fraction- - NYHA class III - fluid overloaded today with worsening symptoms, pedal edema and weight gain - weighing daily; reminded to call for an overnight weight gain of >2 pounds or a weekly weight gain of >5 pounds - will get BMP today with the plan to give metolazone 2.5mg  for 2 doses; daughter will wait to hear from our office before beginning the metolazone as he may also need additional potassium  - not adding salt but does like jr bacon cheeseburger from [employer] or curly fries from [employer]; reviewed the importance of looking at food labels so that he can keep daily sodium intake to 2000mg  / day - saw cardiology (Fath) 11/07/20 - saw palliative care 11/01/20 - participating in paramedicine program - BNP 10/08/19 was 1004.8  2: HTN- - BP looks good (127/59) - saw PCP Edwina Barth) 03/06/21 - BMP 09/14/20 reviewed and showed sodium 138, potassium 4.2, creatinine 0.95 and GFR 79  3: COPD/ PAH- - wearing oxygen at 2.5L around the clock - saw pulmonology Raul Del) 12/31/19; returns 03/21/21 - using nebulizer 4 times/ day   Medication list was reviewed with patient and daughter.   Return in 4 day or sooner for any questions/problems before then.

## 2021-03-16 NOTE — Telephone Encounter (Signed)
Spoke with daughter, [name], regarding patient's BMP results obtained earlier today. Instructed her to give the metolazone as previously discussed and on the 2 days that he takes the metolazone, he's to add an additional potassium tablet on those days. Will recheck labs at his next visit next week.   She verbalized understanding.

## 2021-03-20 ENCOUNTER — Telehealth: Payer: Self-pay | Admitting: Family

## 2021-03-20 ENCOUNTER — Ambulatory Visit: Payer: Medicare Other | Attending: Family | Admitting: Family

## 2021-03-20 ENCOUNTER — Encounter: Payer: Self-pay | Admitting: Family

## 2021-03-20 ENCOUNTER — Other Ambulatory Visit: Payer: Self-pay

## 2021-03-20 VITALS — BP 117/59 | HR 62 | Resp 16 | Ht 69.0 in | Wt 145.4 lb

## 2021-03-20 DIAGNOSIS — Z87891 Personal history of nicotine dependence: Secondary | ICD-10-CM | POA: Insufficient documentation

## 2021-03-20 DIAGNOSIS — Z9181 History of falling: Secondary | ICD-10-CM | POA: Insufficient documentation

## 2021-03-20 DIAGNOSIS — I10 Essential (primary) hypertension: Secondary | ICD-10-CM

## 2021-03-20 DIAGNOSIS — Z7951 Long term (current) use of inhaled steroids: Secondary | ICD-10-CM | POA: Diagnosis not present

## 2021-03-20 DIAGNOSIS — J449 Chronic obstructive pulmonary disease, unspecified: Secondary | ICD-10-CM | POA: Diagnosis not present

## 2021-03-20 DIAGNOSIS — Z79899 Other long term (current) drug therapy: Secondary | ICD-10-CM | POA: Diagnosis not present

## 2021-03-20 DIAGNOSIS — I2721 Secondary pulmonary arterial hypertension: Secondary | ICD-10-CM | POA: Insufficient documentation

## 2021-03-20 DIAGNOSIS — M2550 Pain in unspecified joint: Secondary | ICD-10-CM | POA: Diagnosis not present

## 2021-03-20 DIAGNOSIS — I2510 Atherosclerotic heart disease of native coronary artery without angina pectoris: Secondary | ICD-10-CM | POA: Insufficient documentation

## 2021-03-20 DIAGNOSIS — I5032 Chronic diastolic (congestive) heart failure: Secondary | ICD-10-CM | POA: Insufficient documentation

## 2021-03-20 DIAGNOSIS — Z9981 Dependence on supplemental oxygen: Secondary | ICD-10-CM | POA: Insufficient documentation

## 2021-03-20 DIAGNOSIS — I110 Hypertensive heart disease with heart failure: Secondary | ICD-10-CM | POA: Insufficient documentation

## 2021-03-20 LAB — BASIC METABOLIC PANEL
Anion gap: 9 (ref 5–15)
BUN: 36 mg/dL — ABNORMAL HIGH (ref 8–23)
CO2: 33 mmol/L — ABNORMAL HIGH (ref 22–32)
Calcium: 10.2 mg/dL (ref 8.9–10.3)
Chloride: 93 mmol/L — ABNORMAL LOW (ref 98–111)
Creatinine, Ser: 1.21 mg/dL (ref 0.61–1.24)
GFR, Estimated: 57 mL/min — ABNORMAL LOW (ref >60)
Glucose, Bld: 94 mg/dL (ref 70–99)
Potassium: 4.2 mmol/L (ref 3.5–5.1)
Sodium: 135 mmol/L (ref 135–145)

## 2021-03-20 NOTE — Patient Instructions (Signed)
Continue to weigh daily.  Call the HF office if you have a weight gain.  Return in 1 month.

## 2021-03-20 NOTE — Progress Notes (Signed)
Patient ID: [name], male    DOB: 1928/02/25, 88 y.o.   MRN: [id number]   [name] is a 88 y/o male with a history of CAD, HTN, previous tobacco use and chronic heart failure.   Echo report from 10/08/19 reviewed and showed an EF of 55-60% along with moderately elevated PA pressure, severe LAE/ RAE and mild Christopher. Echo report from 01/06/2019 reviewed and showed an EF of 55-60% along with mild/ moderate Christopher and moderately elevated PA pressure.   Was in the ED 02/27/21 due to thumb laceration where he was evaluated and released.   He presents with a chief complaint of moderate shortness of breath with minimal exertion, although it is improving since he was here four days ago and treated with metolazone 2.5 PO x 2 days. He also has pedal edema, but this has also improved since his visit here four days ago. He denies any orthopnea, abdominal distention, palpitations, chest pain, wheezing, dizziness or fatigue.   Past Medical History:  Diagnosis Date   (HFpEF) heart failure with preserved ejection fraction (HCC)    Aortic atherosclerosis (HCC)    Cardiac murmur    Grade I/VI medium pitched mid systolic blowing at lower LSB   COPD (chronic obstructive pulmonary disease) (HCC)    Coronary artery disease    Dependence on supplemental oxygen    HLD (hyperlipidemia)    Hypertension    Paroxysmal atrial fibrillation (HCC)    PVD (peripheral vascular disease) (HCC)    Skin cancer    nose and ear   Thoracic aortic ectasia (HCC)    a.) measured 3.6 x 3.3 cm on 10/08/2019   Past Surgical History:  Procedure Laterality Date   COLONOSCOPY  2007   ENDARTERECTOMY FEMORAL Right 08/09/2020   Procedure: RIGHT COMMON AND PROFUNDA FEMORAL ENDARTERECTOMY;  Surgeon: Algernon Huxley, MD;  Location: [facility];  Service: Vascular;  Laterality: Right;   EYE SURGERY Bilateral 2005   LOWER EXTREMITY ANGIOGRAPHY Right 07/05/2020   Procedure: LOWER EXTREMITY ANGIOGRAPHY;  Surgeon: Algernon Huxley, MD;  Location:  [place] [facility];  Service: Cardiovascular;  Laterality: Right;   Renal Bypass Surgery Bilateral 2000   No family history on file. Social History   Tobacco Use   Smoking status: Former    Packs/day: 1.00    Years: 30.00    Pack years: 30.00    Types: Cigarettes    Quit date: 1997    Years since quitting: 26.1   Smokeless tobacco: Never  Substance Use Topics   Alcohol use: Never   No Known Allergies  Prior to Admission medications   Medication Sig Start Date End Date Taking? Authorizing Provider  Acetaminophen 500 MG capsule Take by mouth.   Yes [provider]  albuterol (PROVENTIL) (2.5 MG/3ML) 0.083% nebulizer solution Take 3 mLs (2.5 mg total) by nebulization every 6 (six) hours as needed for wheezing or shortness of breath. 01/31/19  Yes Danford, Suann Larry, MD  alendronate (FOSAMAX) 70 MG tablet Take 70 mg by mouth once a week. Sunday morning with full glass of water and sit up do not lay down 06/07/20  Yes [provider]  apixaban (ELIQUIS) 2.5 MG TABS tablet Take 2.5 mg by mouth 2 (two) times daily.   Yes [provider]  ascorbic acid (VITAMIN C) 250 MG tablet Take 1 tablet (250 mg total) by mouth 2 (two) times daily. 08/11/20  Yes Stegmayer, Janalyn Harder, PA-C  aspirin EC 81 MG EC tablet  Take 1 tablet (81 mg total) by mouth daily at 6 (six) AM. Swallow whole. 08/11/20  Yes Stegmayer, Joelene Millin A, PA-C  atorvastatin (LIPITOR) 20 MG tablet Take 1 tablet (20 mg total) by mouth daily. 08/11/20  Yes Stegmayer, Joelene Millin A, PA-C  Calcium Carb-Cholecalciferol (CALCIUM 600/VITAMIN D PO) Take by mouth.   Yes [provider]  CARTIA XT 300 MG 24 hr capsule Take 300 mg by mouth daily. 09/16/19  Yes [provider]  Fluticasone-Umeclidin-Vilant (TRELEGY ELLIPTA) 100-62.5-25 MCG/INH AEPB Inhale into the lungs.   Yes [provider]  furosemide (LASIX) 20 MG tablet TAKE 1 TABLET BY MOUTH DAILY Patient taking differently: 20 mg. Two  tablets M_W_F and 1 tablet Tues, Thurs, Sat and Sun 09/16/19  Yes Hackney, Tina A, FNP  ketoconazole (NIZORAL) 2 % cream Apply 1 application topically at bedtime. 10/11/20 10/01/22 Yes Ralene Bathe, MD  metolazone (ZAROXOLYN) 2.5 MG tablet Take 1 tablet (2.5 mg total) by mouth daily. Take this 1/2 hour before your morning furosemide. 03/16/21 06/14/21 Yes [name] A, FNP  metoprolol tartrate (LOPRESSOR) 50 MG tablet Take 50 mg by mouth 2 (two) times daily.   Yes [provider]  Multiple Vitamin (MULTIVITAMIN WITH MINERALS) TABS tablet Take 1 tablet by mouth daily. 08/12/20  Yes Stegmayer, Janalyn Harder, PA-C  mupirocin ointment (BACTROBAN) 2 % 1 application daily.   Yes [provider]  oxyCODONE-acetaminophen (PERCOCET/ROXICET) 5-325 MG tablet Take 1 tablet by mouth every 8 (eight) hours as needed for moderate pain. 08/11/20  Yes Stegmayer, Joelene Millin A, PA-C  potassium chloride (KLOR-CON) 10 MEQ tablet Take 2 tablets (20 mEq total) by mouth daily. Patient taking differently: Take 20 mEq by mouth daily. 2 tablets M-W-F 1 tablet Tues, thurs, Sat and Sun 01/14/20  Yes Hackney, [name] A, FNP  predniSONE (DELTASONE) 10 MG tablet Take 5 mg by mouth daily with breakfast.   Yes [provider]  senna-docusate (SENOKOT-S) 8.6-50 MG tablet Take 1 tablet by mouth daily.   Yes [provider]  Vitamin D, Ergocalciferol, (DRISDOL) 1.25 MG (50000 UNIT) CAPS capsule Take 1 capsule (50,000 Units total) by mouth every 7 (seven) days. 10/15/19  Yes Wouk, Ailene Rud, MD  Wheat Dextrin (BENEFIBER PO) Take by mouth. 2 teaspoons twice a day   Yes [provider]  metoprolol succinate (TOPROL-XL) 25 MG 24 hr tablet Take 1 tablet by mouth daily. Patient not taking: Reported on 03/16/2021 05/08/20 05/08/21  [provider]  traZODone (DESYREL) 50 MG tablet Take 50 mg by mouth at bedtime as needed for sleep. 11/18/18 11/18/19  [provider]   Review of Systems   Constitutional:  Positive for fatigue (improving). Negative for appetite change.  HENT:  Negative for congestion, postnasal drip and sore throat.   Eyes: Negative.   Respiratory:  Positive for cough and shortness of breath (improving). Negative for wheezing.   Cardiovascular:  Positive for leg swelling. Negative for chest pain and palpitations.  Gastrointestinal:  Negative for abdominal distention and abdominal pain.  Endocrine: Negative.   Genitourinary: Negative.   Musculoskeletal:  Positive for arthralgias (left side due to recent fall). Negative for back pain.  Skin: Negative.   Allergic/Immunologic: Negative.   Neurological:  Negative for dizziness and light-headedness.  Hematological:  Negative for adenopathy. Does not bruise/bleed easily.  Psychiatric/Behavioral:  Negative for dysphoric mood and sleep disturbance (sleeping on 2 pillows). The patient is not nervous/anxious.    Vitals:   03/20/21 1121 03/20/21 1235  BP: (!) 117/59  Pulse: (!) 41 62  Resp: 16   SpO2: 100%   Weight: 145 lb 6 oz (65.9 kg)   Height: 5\' 9"  (1.753 m)    Wt Readings from Last 3 Encounters:  03/20/21 145 lb 6 oz (65.9 kg)  03/16/21 148 lb 7 oz (67.3 kg)  03/14/21 146 lb 9.6 oz (66.5 kg)   Lab Results  Component Value Date   CREATININE 1.13 03/16/2021   CREATININE 0.94 08/11/2020   CREATININE 0.89 08/10/2020    Physical Exam Vitals and nursing note reviewed. Exam conducted with a chaperone present (daughter).  Constitutional:      Appearance: He is well-developed.  HENT:     Head: Normocephalic and atraumatic.  Neck:     Vascular: No JVD.  Cardiovascular:     Rate and Rhythm: Normal rate and regular rhythm.  Pulmonary:     Effort: Pulmonary effort is normal. No respiratory distress.     Breath sounds: Examination of the right-lower field reveals rales. Rales (few rales heard) present. No wheezing.  Abdominal:     Palpations: Abdomen is soft.     Tenderness: There is no abdominal  tenderness.  Musculoskeletal:        General: No tenderness.     Cervical back: Normal range of motion and neck supple.     Right lower leg: No tenderness. Edema (1+ pitting) present.     Left lower leg: No tenderness. Edema (1+ pitting) present.  Skin:    General: Skin is warm and dry.     Findings: Ecchymosis present.  Neurological:     General: No focal deficit present.     Mental Status: He is alert and oriented to person, place, and time.  Psychiatric:        Mood and Affect: Mood normal.        Behavior: Behavior normal.   Assessment & Plan:  1: Chronic heart failure with preserved ejection fraction- - NYHA class III - euvolemic today  - was given metolazone x 2 days, weight down 3 lbs from four days ago; reminded to call for an overnight weight gain of > 2 pounds or a weekly weight gain of > 5 pounds - check BMP today - saw cardiology ([name]) 11/07/20; will see Christopher [place]) in September 2023 - saw palliative care 11/01/20 - participating in paramedicine program - BNP 10/08/19 was 1004.8  2: HTN- - BP looks good 117/59 - saw PCP Christopher Barth) 03/06/21 - BMP 03/16/21 reviewed and showed sodium 135, potassium 4.4, creatinine 1.13 and GFR 60  3: COPD/ PAH- - wearing oxygen at 2.5L around the clock - saw pulmonology [name]) 12/31/19; returns 03/21/21  - using nebulizer 4 times/ day   Medication list was reviewed with patient and daughter.   Return in 1 month or sooner for any questions/problems before then.

## 2021-03-20 NOTE — Telephone Encounter (Signed)
Agree with NP student.

## 2021-03-27 ENCOUNTER — Other Ambulatory Visit (INDEPENDENT_AMBULATORY_CARE_PROVIDER_SITE_OTHER): Payer: Medicare Other

## 2021-03-27 ENCOUNTER — Encounter (INDEPENDENT_AMBULATORY_CARE_PROVIDER_SITE_OTHER): Payer: Medicare Other

## 2021-03-27 ENCOUNTER — Ambulatory Visit (INDEPENDENT_AMBULATORY_CARE_PROVIDER_SITE_OTHER): Payer: Medicare Other | Admitting: Vascular Surgery

## 2021-03-27 ENCOUNTER — Encounter (INDEPENDENT_AMBULATORY_CARE_PROVIDER_SITE_OTHER): Payer: Self-pay

## 2021-03-29 ENCOUNTER — Other Ambulatory Visit (HOSPITAL_COMMUNITY): Payer: Self-pay

## 2021-03-29 ENCOUNTER — Encounter (HOSPITAL_COMMUNITY): Payer: Self-pay

## 2021-03-29 NOTE — Progress Notes (Signed)
Today had a home visit with [name].  He states doing much better.  Appetite is good and regular bowel movements.  He states can stand for almost an hour now in the kitchen and walk across the room without being short of breath.  He has resumed his normal dose of furosemide now.  His weight is still fluctuating but he states im eating more.  He has little swelling in let leg and none in right.  He states side still hurting some but not as bad.  Lungs are clear today and able to take a deep breath.  He denies chest pain, headaches or dizziness.  He has good support from his daughter.  He has everything needed for daily living.  He is pleasant and mood is good.  He has all his medications.  Will continue to visit for heart failure, diet and medication management.  ? ?Pelham Hennick ?[place] EMT-Paramedic ?[phone] ?

## 2021-04-09 ENCOUNTER — Other Ambulatory Visit (HOSPITAL_COMMUNITY): Payer: Self-pay

## 2021-04-09 ENCOUNTER — Encounter (HOSPITAL_COMMUNITY): Payer: Self-pay

## 2021-04-09 NOTE — Progress Notes (Signed)
Today had a home visit with [name] due to his daughter called and advised he was getting short of breath.  When arrived him walking about 10 feet he was out of breath.  He gets better within a few minutes once seated. He states started about a week ago.  He states did not want to worry his daughter.  Looked back at his weights wrote down, today weight of 148.2 kg, last week 145 lbs and 1 month was 142 lbs.  He has very little edema in lower extremity.  Left lower lobe has some crackles, right is clear.  He spoke about eating pork chop biscuits, bacon, egg and cheese biscuits, hamburger steak and gravy and fried fish.  This just in last couples of days.  He denies chest pain.  He is on oxygen 24 hrs.  He states sleeps ok unless his kidneys wake him up.  He is elevating his feet throughout the day and doing his breathing exercises.  He been mostly staying alone and still able to small chores around the home.  He is able to reheat food but right now has to sit and take a break due to his breathing.  Contacted [name] and she advised to double his furosemide tomorrow as he already doubles today and Wednesday.  Will make a home visit again on Wednesday.  He has all his medications and aware of how to take them.  He is able to sit and talk in full sentences but unable to speak when ambulating like normal.  We discussed diet and how much fluids he is drinking.  He is aware to call if breathing gets worse or knows to call 911.  Will continue to visit for heart failure, diet and medication management.  ? ?Telia Amundson ?[place] EMT-Paramedic ?[phone] ?

## 2021-04-11 ENCOUNTER — Other Ambulatory Visit (HOSPITAL_COMMUNITY): Payer: Self-pay

## 2021-04-11 ENCOUNTER — Encounter (HOSPITAL_COMMUNITY): Payer: Self-pay

## 2021-04-11 NOTE — Progress Notes (Signed)
Today had a home visit to check on [name].  He states not sure if he took the extra lasix yesterday as ordered because he states did not urinate as much.  He states weight up more yesterday and down today.  He states breathing improving but not back to his normal. He states coughed up some junk during the night and this morning.  Appetite is good.  He states drinking enough, close to 60 ounces.  He states having bowel movements.  Lungs are clear today, he states able to take a deeper breath today than Monday.  He states able to stand longer without getting as short of breath.  He states able to do some chores now.  Contacted [name] and she advised to take extra lasix tomorrow and continue back on normal regimen on Friday.  Asked if his daughter could give me his weight in the morning.  He is aware to call if worsens.  Edema is not bad in lower legs and abdomen is soft.  Daughter has rescheduled his pulmonology appt.  Will continue to visit for heart failure, diet and medication management.  ? ?[name] ?[place] EMT-Paramedic ?[phone] ?

## 2021-04-18 ENCOUNTER — Ambulatory Visit: Payer: Medicare Other | Admitting: Family

## 2021-05-07 ENCOUNTER — Other Ambulatory Visit (INDEPENDENT_AMBULATORY_CARE_PROVIDER_SITE_OTHER): Payer: Self-pay | Admitting: Nurse Practitioner

## 2021-05-07 ENCOUNTER — Other Ambulatory Visit (INDEPENDENT_AMBULATORY_CARE_PROVIDER_SITE_OTHER): Payer: Self-pay

## 2021-05-07 DIAGNOSIS — L97909 Non-pressure chronic ulcer of unspecified part of unspecified lower leg with unspecified severity: Secondary | ICD-10-CM

## 2021-05-08 ENCOUNTER — Encounter (INDEPENDENT_AMBULATORY_CARE_PROVIDER_SITE_OTHER): Payer: Medicare Other

## 2021-05-08 ENCOUNTER — Encounter (INDEPENDENT_AMBULATORY_CARE_PROVIDER_SITE_OTHER): Payer: Self-pay

## 2021-05-08 ENCOUNTER — Ambulatory Visit (INDEPENDENT_AMBULATORY_CARE_PROVIDER_SITE_OTHER): Payer: Medicare Other | Admitting: Vascular Surgery

## 2021-05-08 ENCOUNTER — Ambulatory Visit (INDEPENDENT_AMBULATORY_CARE_PROVIDER_SITE_OTHER): Payer: Medicare Other | Admitting: Nurse Practitioner

## 2021-05-08 ENCOUNTER — Ambulatory Visit (INDEPENDENT_AMBULATORY_CARE_PROVIDER_SITE_OTHER): Payer: Medicare Other

## 2021-05-08 ENCOUNTER — Encounter (INDEPENDENT_AMBULATORY_CARE_PROVIDER_SITE_OTHER): Payer: Self-pay | Admitting: Nurse Practitioner

## 2021-05-08 VITALS — BP 128/50 | HR 55 | Resp 18 | Ht 70.8 in

## 2021-05-08 DIAGNOSIS — L97909 Non-pressure chronic ulcer of unspecified part of unspecified lower leg with unspecified severity: Secondary | ICD-10-CM

## 2021-05-08 DIAGNOSIS — I70299 Other atherosclerosis of native arteries of extremities, unspecified extremity: Secondary | ICD-10-CM

## 2021-05-08 DIAGNOSIS — E785 Hyperlipidemia, unspecified: Secondary | ICD-10-CM

## 2021-05-08 DIAGNOSIS — I10 Essential (primary) hypertension: Secondary | ICD-10-CM | POA: Diagnosis not present

## 2021-05-16 ENCOUNTER — Telehealth (INDEPENDENT_AMBULATORY_CARE_PROVIDER_SITE_OTHER): Payer: Self-pay | Admitting: Nurse Practitioner

## 2021-05-16 NOTE — Telephone Encounter (Signed)
ABIS to see me or JD

## 2021-05-16 NOTE — Telephone Encounter (Signed)
[name] daughter called and stated fb wanted him to have a 3 month followup with an ultrasound.  What should he be scheduled for in 3 months? ?

## 2021-05-19 NOTE — Progress Notes (Signed)
? Patient ID: [name], male    DOB: [birth year], 88 y.o.   MRN: [id number] ? ? ?Mr Wandel is a 88 y/o male with a history of CAD, HTN, previous tobacco use and chronic heart failure.  ? ?Echo report from 10/08/19 reviewed and showed an EF of 55-60% along with moderately elevated PA pressure, severe LAE/ RAE and mild MR. Echo report from 01/06/2019 reviewed and showed an EF of 55-60% along with mild/ moderate MR and moderately elevated PA pressure.  ? ?Was in the ED 02/27/21 due to thumb laceration where he was evaluated and released.  ? ?He presents with a chief complaint of moderate shortness of breath with minimal exertion although he feels like it may be improving. He has associated fatigue, cough, pedal edema and possible weight gain along with this. He denies any dizziness, difficulty sleeping, abdominal distention, palpitations or chest pain.  ? ?He says that he's had a few pound weight gain at home. No change in his diet. Says that he has great difficulty in wearing compression socks because he has difficulty in putting them on.  ? ?Past Medical History:  ?Diagnosis Date  ? (HFpEF) heart failure with preserved ejection fraction ([place])   ? Aortic atherosclerosis ([place])   ? Cardiac murmur   ? Grade I/VI medium pitched mid systolic blowing at lower LSB  ? COPD (chronic obstructive pulmonary disease) ([place])   ? Coronary artery disease   ? Dependence on supplemental oxygen   ? HLD (hyperlipidemia)   ? Hypertension   ? Paroxysmal atrial fibrillation (HCC)   ? PVD (peripheral vascular disease) ([place])   ? Skin cancer   ? nose and ear  ? Thoracic aortic ectasia (HCC)   ? a.) measured 3.6 x 3.3 cm on 10/08/2019  ? ?Past Surgical History:  ?Procedure Laterality Date  ? COLONOSCOPY  2007  ? ENDARTERECTOMY FEMORAL Right 08/09/2020  ? Procedure: RIGHT COMMON AND PROFUNDA FEMORAL ENDARTERECTOMY;  Surgeon: Algernon Huxley, MD;  Location: [facility];  Service: Vascular;  Laterality: Right;  ? EYE SURGERY Bilateral 2005  ? LOWER  EXTREMITY ANGIOGRAPHY Right 07/05/2020  ? Procedure: LOWER EXTREMITY ANGIOGRAPHY;  Surgeon: Algernon Huxley, MD;  Location: [place] [facility];  Service: Cardiovascular;  Laterality: Right;  ? Renal Bypass Surgery Bilateral 2000  ? ?No family history on file. ?Social History  ? ?Tobacco Use  ? Smoking status: Former  ?  Packs/day: 1.00  ?  Years: 30.00  ?  Pack years: 30.00  ?  Types: Cigarettes  ?  Quit date: 1997  ?  Years since quitting: 26.3  ? Smokeless tobacco: Never  ?Substance Use Topics  ? Alcohol use: Never  ? ?No Known Allergies ? ?Prior to Admission medications   ?Medication Sig Start Date End Date Taking? Authorizing Provider  ?Acetaminophen 500 MG capsule Take by mouth.   Yes [provider]  ?albuterol (PROVENTIL) (2.5 MG/3ML) 0.083% nebulizer solution Take 3 mLs (2.5 mg total) by nebulization every 6 (six) hours as needed for wheezing or shortness of breath. 01/31/19  Yes Danford, Suann Larry, MD  ?alendronate (FOSAMAX) 70 MG tablet Take 70 mg by mouth once a week. Sunday morning with full glass of water and sit up do not lay down 06/07/20  Yes [provider]  ?apixaban (ELIQUIS) 2.5 MG TABS tablet Take 2.5 mg by mouth 2 (two) times daily.   Yes [provider]  ?ascorbic acid (VITAMIN C) 250 MG tablet Take 1 tablet (250 mg  total) by mouth 2 (two) times daily. 08/11/20  Yes Stegmayer, Janalyn Harder, PA-C  ?aspirin EC 81 MG EC tablet Take 1 tablet (81 mg total) by mouth daily at 6 (six) AM. Swallow whole. 08/11/20  Yes Stegmayer, Janalyn Harder, PA-C  ?atorvastatin (LIPITOR) 20 MG tablet Take 1 tablet (20 mg total) by mouth daily. 08/11/20  Yes Stegmayer, Janalyn Harder, PA-C  ?Calcium Carb-Cholecalciferol (CALCIUM 600/VITAMIN D PO) Take by mouth.   Yes [provider]  ?CARTIA XT 300 MG 24 hr capsule Take 300 mg by mouth daily. 09/16/19  Yes [provider]  ?Fluticasone-Umeclidin-Vilant (TRELEGY ELLIPTA) 100-62.5-25 MCG/INH AEPB Inhale into the lungs.   Yes [provider]  ?furosemide (LASIX) 20 MG tablet TAKE 1 TABLET BY MOUTH DAILY ?Patient taking differently: 20 mg. Two tablets M_W_F and 1 tablet Tues, Thurs, Sat and Sun 09/16/19  Yes Alisa Graff, FNP  ?metoprolol tartrate (LOPRESSOR) 50 MG tablet Take 50 mg by mouth 2 (two) times daily.   Yes [provider]  ?Multiple Vitamin (MULTIVITAMIN WITH MINERALS) TABS tablet Take 1 tablet by mouth daily. 08/12/20  Yes Stegmayer, Joelene Millin A, PA-C  ?potassium chloride (KLOR-CON) 10 MEQ tablet Take 2 tablets (20 mEq total) by mouth daily. ?Patient taking differently: Take 20 mEq by mouth daily. 2 tablets M-W-F 1 tablet Tues, thurs, Sat and Sun 01/14/20  Yes Alisa Graff, FNP  ?predniSONE (DELTASONE) 10 MG tablet Take 5 mg by mouth daily with breakfast.   Yes [provider]  ?Probiotic Product (PROBIOTIC GUMMIES) 30 MG CHEW Chew by mouth.   Yes [provider]  ?senna-docusate (SENOKOT-S) 8.6-50 MG tablet Take 1 tablet by mouth daily.   Yes [provider]  ?Vitamin D, Ergocalciferol, (DRISDOL) 1.25 MG (50000 UNIT) CAPS capsule Take 1 capsule (50,000 Units total) by mouth every 7 (seven) days. 10/15/19  Yes Wouk, Ailene Rud, MD  ?Wheat Dextrin (BENEFIBER PO) Take by mouth. 2 teaspoons twice a day   Yes [provider]  ?ketoconazole (NIZORAL) 2 % cream Apply 1 application topically at bedtime. ?Patient not taking: Reported on 05/08/2021 10/11/20 10/01/22  Ralene Bathe, MD  ?metoprolol succinate (TOPROL-XL) 25 MG 24 hr tablet Take 1 tablet by mouth daily. 05/08/20 05/08/21  [provider]  ?mupirocin ointment (BACTROBAN) 2 % 1 application daily. ?Patient not taking: Reported on 05/08/2021    [provider]  ?oxyCODONE-acetaminophen (PERCOCET/ROXICET) 5-325 MG tablet Take 1 tablet by mouth every 8 (eight) hours as needed for moderate pain. ?Patient not taking: Reported on 04/09/2021 08/11/20   Sela Hua, PA-C  ? ? ?Review of Systems  ?Constitutional:   Positive for fatigue (improving). Negative for appetite change.  ?HENT:  Negative for congestion, postnasal drip and sore throat.   ?Eyes: Negative.   ?Respiratory:  Positive for cough and shortness of breath (improving). Negative for wheezing.   ?Cardiovascular:  Positive for leg swelling. Negative for chest pain and palpitations.  ?Gastrointestinal:  Negative for abdominal distention and abdominal pain.  ?Endocrine: Negative.   ?Genitourinary: Negative.   ?Musculoskeletal:  Negative for arthralgias and back pain.  ?Skin: Negative.   ?Allergic/Immunologic: Negative.   ?Neurological:  Negative for dizziness and light-headedness.  ?Hematological:  Negative for adenopathy. Does not bruise/bleed easily.  ?Psychiatric/Behavioral:  Negative for dysphoric mood and sleep disturbance (sleeping on 2 pillows). The patient is not nervous/anxious.   ? ?Vitals:  ? 05/21/21 1530  ?BP: 138/78  ?Pulse: (!) 55  ?Resp: 16  ?SpO2: 100%  ?Weight:  147 lb (66.7 kg)  ?Height: '5\' 9"'$  (1.753 m)  ? ?Wt Readings from Last 3 Encounters:  ?05/21/21 147 lb (66.7 kg)  ?04/11/21 147 lb 12.8 oz (67 kg)  ?04/09/21 (P) 148 lb 3.2 oz (67.2 kg)  ? ?Lab Results  ?Component Value Date  ? CREATININE 1.21 03/20/2021  ? CREATININE 1.13 03/16/2021  ? CREATININE 0.94 08/11/2020  ? ?Physical Exam ?Vitals and nursing note reviewed. Exam conducted with a chaperone present (daughter).  ?Constitutional:   ?   Appearance: He is well-developed.  ?HENT:  ?   Head: Normocephalic and atraumatic.  ?Neck:  ?   Vascular: No JVD.  ?Cardiovascular:  ?   Rate and Rhythm: Regular rhythm. Bradycardia present.  ?Pulmonary:  ?   Effort: Pulmonary effort is normal. No respiratory distress.  ?   Breath sounds: No wheezing or rales.  ?Abdominal:  ?   Palpations: Abdomen is soft.  ?   Tenderness: There is no abdominal tenderness.  ?Musculoskeletal:     ?   General: No tenderness.  ?   Cervical back: Normal range of motion and neck supple.  ?   Right lower leg: No tenderness. Edema  (1+ pitting) present.  ?   Left lower leg: No tenderness. Edema (1+ pitting) present.  ?Skin: ?   General: Skin is warm and dry.  ?   Findings: Ecchymosis present.  ?Neurological:  ?   General: No focal

## 2021-05-20 ENCOUNTER — Encounter (INDEPENDENT_AMBULATORY_CARE_PROVIDER_SITE_OTHER): Payer: Self-pay | Admitting: Nurse Practitioner

## 2021-05-20 NOTE — Progress Notes (Signed)
? ?Subjective:  ? ? Patient ID: [name], male    DOB: 1928-11-02, 88 y.o.   MRN: [id number] ?No chief complaint on file. ? ? ?The patient returns to the office for followup and review of the noninvasive studies.  ? ?There have been no interval changes in lower extremity symptoms. No interval shortening of the patient's claudication distance or development of rest pain symptoms. No new ulcers or wounds have occurred since the last visit.  Patient has completely healed from previous ulceration ? ?There have been no significant changes to the patient's overall health care. ? ?The patient denies amaurosis fugax or recent TIA symptoms. There are no documented recent neurological changes noted. ?There is no history of DVT, PE or superficial thrombophlebitis. ?The patient denies recent episodes of angina or shortness of breath.  ? ?Noninvasive studies today show biphasic waveforms in the distal common femoral artery as well as the profunda.  The patient shows mostly absent waveforms throughout the right lower extremity with the exception of monophasic waveforms in the anterior tibial artery.  The patient was also noted to have extensive collateralization throughout the right lower extremity.  The left lower extremity also has noted monophasic flow throughout the leg in the common femoral artery, profunda and proximal SFA.  There is also collateral flow noted in the posterior tibial artery as well. ? ? ?Review of Systems  ?Skin:  Negative for wound.  ?Neurological:  Positive for weakness.  ?All other systems reviewed and are negative. ? ?   ?Objective:  ? Physical Exam ?Vitals reviewed.  ?HENT:  ?   Head: Normocephalic.  ?Cardiovascular:  ?   Rate and Rhythm: Normal rate.  ?Pulmonary:  ?   Effort: Pulmonary effort is normal.  ?Skin: ?   General: Skin is warm and dry.  ?Neurological:  ?   Mental Status: He is alert and oriented to person, place, and time.  ?Psychiatric:     ?   Mood and Affect: Mood normal.     ?    Behavior: Behavior normal.     ?   Thought Content: Thought content normal.     ?   Judgment: Judgment normal.  ? ? ?BP (!) 128/50 (BP Location: Left Arm)   Pulse (!) 55   Resp 18   Ht 5' 10.8" (1.798 m)   BMI 20.73 kg/m?  ? ?Past Medical History:  ?Diagnosis Date  ? (HFpEF) heart failure with preserved ejection fraction ([place])   ? Aortic atherosclerosis ([place])   ? Cardiac murmur   ? Grade I/VI medium pitched mid systolic blowing at lower LSB  ? COPD (chronic obstructive pulmonary disease) ([place])   ? Coronary artery disease   ? Dependence on supplemental oxygen   ? HLD (hyperlipidemia)   ? Hypertension   ? Paroxysmal atrial fibrillation (HCC)   ? PVD (peripheral vascular disease) ([place])   ? Skin cancer   ? nose and ear  ? Thoracic aortic ectasia (HCC)   ? a.) measured 3.6 x 3.3 cm on 10/08/2019  ? ? ?Social History  ? ?Socioeconomic History  ? Marital status: Married  ?  Spouse name: Not on file  ? Number of children: Not on file  ? Years of education: Not on file  ? Highest education level: Not on file  ?Occupational History  ? Not on file  ?Tobacco Use  ? Smoking status: Former  ?  Packs/day: 1.00  ?  Years: 30.00  ?  Pack years: 30.00  ?  Types: Cigarettes  ?  Quit date: 1997  ?  Years since quitting: 26.3  ? Smokeless tobacco: Never  ?Vaping Use  ? Vaping Use: Never used  ?Substance and Sexual Activity  ? Alcohol use: Never  ? Drug use: Never  ? Sexual activity: Not Currently  ?Other Topics Concern  ? Not on file  ?Social History Narrative  ? Lives at home alone with help from daughter.  ? ?Social Determinants of Health  ? ?Financial Resource Strain: Not on file  ?Food Insecurity: Not on file  ?Transportation Needs: Not on file  ?Physical Activity: Not on file  ?Stress: Not on file  ?Social Connections: Not on file  ?Intimate Partner Violence: Not on file  ? ? ?Past Surgical History:  ?Procedure Laterality Date  ? COLONOSCOPY  2007  ? ENDARTERECTOMY FEMORAL Right 08/09/2020  ? Procedure: RIGHT COMMON AND PROFUNDA  FEMORAL ENDARTERECTOMY;  Surgeon: Algernon Huxley, MD;  Location: [facility] ORS;  Service: Vascular;  Laterality: Right;  ? EYE SURGERY Bilateral 2005  ? LOWER EXTREMITY ANGIOGRAPHY Right 07/05/2020  ? Procedure: LOWER EXTREMITY ANGIOGRAPHY;  Surgeon: Algernon Huxley, MD;  Location: [facility];  Service: Cardiovascular;  Laterality: Right;  ? Renal Bypass Surgery Bilateral 2000  ? ? ?History reviewed. No pertinent family history. ? ?No Known Allergies ? ? ?  Latest Ref Rng & Units 08/11/2020  ?  5:27 AM 08/10/2020  ?  1:05 AM 08/09/2020  ? 12:40 PM  ?CBC  ?WBC 4.0 - 10.5 K/uL 9.7   9.3   6.9    ?Hemoglobin 13.0 - 17.0 g/dL 9.6   9.2   9.6    ?Hematocrit 39.0 - 52.0 % 30.3   28.2   29.5    ?Platelets 150 - 400 K/uL 290   230   237    ? ? ? ? ?CMP  ?   ?Component Value Date/Time  ? NA 135 03/20/2021 1222  ? NA 140 02/02/2014 0356  ? K 4.2 03/20/2021 1222  ? K 3.7 02/02/2014 0356  ? CL 93 (L) 03/20/2021 1222  ? CL 104 02/02/2014 0356  ? CO2 33 (H) 03/20/2021 1222  ? CO2 29 02/02/2014 0356  ? GLUCOSE 94 03/20/2021 1222  ? GLUCOSE 160 (H) 02/02/2014 0356  ? BUN 36 (H) 03/20/2021 1222  ? BUN 33 (H) 02/02/2014 0356  ? CREATININE 1.21 03/20/2021 1222  ? CREATININE 0.86 02/02/2014 0356  ? CALCIUM 10.2 03/20/2021 1222  ? CALCIUM 8.6 10/10/2019 0305  ? PROT 7.6 10/08/2019 1150  ? PROT 7.5 01/29/2014 0949  ? ALBUMIN 4.0 10/08/2019 1150  ? ALBUMIN 3.1 (L) 01/29/2014 0949  ? AST 19 10/08/2019 1150  ? AST 39 (H) 01/29/2014 0949  ? ALT 14 10/08/2019 1150  ? ALT 27 01/29/2014 0949  ? ALKPHOS 66 10/08/2019 1150  ? ALKPHOS 79 01/29/2014 0949  ? BILITOT 1.1 10/08/2019 1150  ? BILITOT 0.7 01/29/2014 0949  ? GFRNONAA 57 (L) 03/20/2021 1222  ? GFRNONAA >60 02/02/2014 0356  ? GFRNONAA >60 06/25/2011 1325  ? GFRAA >60 10/15/2019 0634  ? GFRAA >60 02/02/2014 0356  ? GFRAA >60 06/25/2011 1325  ? ? ? ?No results found. ? ?   ?Assessment & Plan:  ? ?1. Atherosclerosis of artery of extremity with ulceration ([place]) ?Currently the patient has no open  wounds or ulcerations and he has no significant claudication pain or rest pain.  The patient's vessels as evidenced by studies today are heavily calcific and have multiple  areas of occlusion.  Following review of the angiogram and discussion with Dr. Lucky Cowboy this is consistent with the previous angiogram that the patient had on 08/09/2020.  The patient does have notable significant collaterals likely helping his continued perfusion.  Due to the lack of any symptoms as well as no limb threatening symptoms we will continue to watch the patient closely in 59-monthfollow-up to ensure there are no developing symptoms.  The patient and family are advised to contact [place] he begins to have open wounds or ulceration or begins to experience rest pain like symptoms. ? ?2. Primary hypertension ?Continue antihypertensive medications as already ordered, these medications have been reviewed and there are no changes at this time.  ? ?3. Hyperlipidemia, unspecified hyperlipidemia type ?Continue statin as ordered and reviewed, no changes at this time  ? ? ?Current Outpatient Medications on File Prior to Visit  ?Medication Sig Dispense Refill  ? Acetaminophen 500 MG capsule Take by mouth.    ? albuterol (PROVENTIL) (2.5 MG/3ML) 0.083% nebulizer solution Take 3 mLs (2.5 mg total) by nebulization every 6 (six) hours as needed for wheezing or shortness of breath. 75 mL 0  ? alendronate (FOSAMAX) 70 MG tablet Take 70 mg by mouth once a week. Sunday morning with full glass of water and sit up do not lay down    ? apixaban (ELIQUIS) 2.5 MG TABS tablet Take 2.5 mg by mouth 2 (two) times daily.    ? ascorbic acid (VITAMIN C) 250 MG tablet Take 1 tablet (250 mg total) by mouth 2 (two) times daily. 60 tablet 6  ? aspirin EC 81 MG EC tablet Take 1 tablet (81 mg total) by mouth daily at 6 (six) AM. Swallow whole. 90 tablet 3  ? atorvastatin (LIPITOR) 20 MG tablet Take 1 tablet (20 mg total) by mouth daily. 90 tablet 3  ? Calcium Carb-Cholecalciferol  (CALCIUM 600/VITAMIN D PO) Take by mouth.    ? CARTIA XT 300 MG 24 hr capsule Take 300 mg by mouth daily.    ? Fluticasone-Umeclidin-Vilant (TRELEGY ELLIPTA) 100-62.5-25 MCG/INH AEPB Inhale into the lungs

## 2021-05-21 ENCOUNTER — Other Ambulatory Visit (HOSPITAL_COMMUNITY): Payer: Self-pay

## 2021-05-21 ENCOUNTER — Encounter: Payer: Self-pay | Admitting: Family

## 2021-05-21 ENCOUNTER — Ambulatory Visit: Payer: Medicare Other | Attending: Family | Admitting: Family

## 2021-05-21 VITALS — BP 138/78 | HR 55 | Resp 16 | Ht 69.0 in | Wt 147.0 lb

## 2021-05-21 DIAGNOSIS — I5032 Chronic diastolic (congestive) heart failure: Secondary | ICD-10-CM | POA: Diagnosis not present

## 2021-05-21 DIAGNOSIS — E785 Hyperlipidemia, unspecified: Secondary | ICD-10-CM | POA: Insufficient documentation

## 2021-05-21 DIAGNOSIS — J449 Chronic obstructive pulmonary disease, unspecified: Secondary | ICD-10-CM | POA: Diagnosis not present

## 2021-05-21 DIAGNOSIS — I10 Essential (primary) hypertension: Secondary | ICD-10-CM

## 2021-05-21 DIAGNOSIS — I110 Hypertensive heart disease with heart failure: Secondary | ICD-10-CM | POA: Insufficient documentation

## 2021-05-21 DIAGNOSIS — Z87891 Personal history of nicotine dependence: Secondary | ICD-10-CM | POA: Diagnosis not present

## 2021-05-21 DIAGNOSIS — I2510 Atherosclerotic heart disease of native coronary artery without angina pectoris: Secondary | ICD-10-CM | POA: Insufficient documentation

## 2021-05-21 DIAGNOSIS — Z7901 Long term (current) use of anticoagulants: Secondary | ICD-10-CM | POA: Insufficient documentation

## 2021-05-21 DIAGNOSIS — Z79899 Other long term (current) drug therapy: Secondary | ICD-10-CM | POA: Diagnosis not present

## 2021-05-21 DIAGNOSIS — I890 Lymphedema, not elsewhere classified: Secondary | ICD-10-CM

## 2021-05-21 DIAGNOSIS — Z7982 Long term (current) use of aspirin: Secondary | ICD-10-CM | POA: Diagnosis not present

## 2021-05-21 NOTE — Patient Instructions (Addendum)
Continue weighing daily and call for an overnight weight gain of 3 pounds or more or a weekly weight gain of more than 5 pounds. ? ? ?If you have voicemail, please make sure your mailbox is cleaned out so that we may leave a message and please make sure to listen to any voicemails.  ? ? ?Begin wearing compression socks every morning with removal at bedtime.  ?

## 2021-05-21 NOTE — Progress Notes (Signed)
Made a visit with [name] while he was at Heart Failure clinic.  He was advised to try zip on compression socks to help with edema.  [name] changed no meds and lungs were clear.  Discussed foods and fluids but he eats wants he wants and enjoys.  He weighs daily and writes it down. He has all his medications and daughter helps with anything he needs.  He denies any problems today.  States he has some pain in legs when awakes in the bed in the am but goes away during the day.  He is on oxygen 24 hrs a day.  He has a person that comes in once a week to bathe him.  [name] his daughter either at his home at night or close by.  Will continue to visit for heart failure, diet and medication management.  ? ?[name] ?[name] EMT-Paramedic ?[phone] ?

## 2021-06-12 ENCOUNTER — Encounter (HOSPITAL_COMMUNITY): Payer: Self-pay

## 2021-06-12 ENCOUNTER — Other Ambulatory Visit (HOSPITAL_COMMUNITY): Payer: Self-pay

## 2021-06-12 NOTE — Progress Notes (Signed)
Had a home visit with Maynor.  He is doing well.  Does not have any complaints, such as chest pain, headaches, dizziness or increased shortness of breath.  He did states he able to do more around the home.  He cooked his breakfast this morning.  He states getting around the home fairly well.  He is sleeping well.  He states takes 1 tylenol arthritis at night to help with soreness in the morning from laying in the bed.  He states no pain in the day just before he gets out of the bed. States he does simple exercises sitting in chair.  He is on oxygen 24/7.  He is able to dress his self and do simple bathing.  His daughter is his caregiver, she checks on him daily and spends the night with him a lot.  He has everything for daily living.  His niece visit every weekend and brings him sweets she bakes.  He is eating 3 meals a day, small ones.  He is alert and appears to be in a good mood.  He knows how to take his medications.  His daughter makes sure they are put where he can take them.  His daughter drives him to his appts.  Lungs sounds clear, he is wearing tall socks to his knees, he states helping with edema.  He has no edema in his legs and abdomen is soft.  Will continue to visit for heart failure, diet and medication management.  ? ?Daryel Kenneth ?[place] EMT-Paramedic ?[phone] ?

## 2021-07-04 ENCOUNTER — Telehealth: Payer: Self-pay

## 2021-07-04 NOTE — Telephone Encounter (Signed)
955 am.  Phone call made to daughter [name] to follow up on patient and offer a home visit.  No answer and unable to leave a VM as mailbox is full.

## 2021-07-05 ENCOUNTER — Other Ambulatory Visit: Payer: Medicare Other

## 2021-07-05 DIAGNOSIS — Z515 Encounter for palliative care: Secondary | ICD-10-CM

## 2021-07-05 NOTE — Progress Notes (Signed)
PATIENT NAME: [name] DOB: 1928-03-19 MRN: [id number]  PRIMARY CARE PROVIDER: Baxter Hire, MD  RESPONSIBLE PARTY:  Acct ID - Guarantor Home Phone Work Phone Relationship Acct Type  [id number] [name], [name]* [phone]  Self P/F     [place], [place], [place] [address]    I connected with  [name] for [name] on 07/05/21 by telephone and verified that I am speaking with the correct person using two identifiers.   I discussed the limitations of evaluation and management by telemedicine. The patient expressed understanding and agreed to proceed.   Connected with daughter [name] to obtain an update on patient and schedule a home visit with NP.  [name] advises patient is doing well overall.  Continues to be followed by HF clinic with Nile Dear, EMT doing home visits.  Paitent remain on O2 at 2 L via [place].  Has shortness of breath but this is no worse than normal.  Continues with routine weights.    [name] notes 3 areas of concern that may possibly be skin cancer.  She will have EMT check areas next week and then decide next steps.  Patient continues with a good appetite. Occasional issues with pain to bilateral legs that typically occur around 3-4 am.  Once patient is up and moving no further issues with pain.  He is taking acetaminophen 1 tablet at HS which is helpful in managing pain.  Update provided to Riki Rusk, NP.  Follow up visit scheduled for Christin Gusler, NP for 09/19/21.  Lorenza Burton, RN

## 2021-07-11 ENCOUNTER — Other Ambulatory Visit (HOSPITAL_COMMUNITY): Payer: Self-pay

## 2021-07-12 ENCOUNTER — Encounter (HOSPITAL_COMMUNITY): Payer: Self-pay

## 2021-07-12 NOTE — Progress Notes (Signed)
Had a home visit with Jacky.  He states he has been doing well, he looks good.  He has titrated his oxygen to 3 lpm from 4 lpm from his last hospital stay.  He is staying at his home more by his self and he is able to do more around the home for his self.  He has about 3 spots that advised him to see his skin doctor about, they appeared a few weeks ago he states, dark in color, he has had skin cancer before.  1 on left side under rib area, 1 on inner of right thigh and other in crease of hip area.  His daughter will move his appt up from September.  Lungs sounds clear.  He advised still doing his breathing exercises.  He states coughs off some phlegm but not as much and not as thick.  He states has some leg pain if lay in bed too long in morning but once up and about goes away.  He is able to dress his self and mostly bath his self.  He has cooked some simple things.  He has everything for daily living.  He is wearing long socks wo help with edema.  Has some around his ankles, legs look good.  He denies having any sores on his feet.  He has all his medications and aware of how to take.  His daughter lays them out and makes sure he takes them.  Will continue to visit for heart failure, diet and medication management.   [place] [phone]

## 2021-07-25 ENCOUNTER — Ambulatory Visit (INDEPENDENT_AMBULATORY_CARE_PROVIDER_SITE_OTHER): Payer: Medicare Other | Admitting: Dermatology

## 2021-07-25 ENCOUNTER — Encounter: Payer: Self-pay | Admitting: Dermatology

## 2021-07-25 DIAGNOSIS — D485 Neoplasm of uncertain behavior of skin: Secondary | ICD-10-CM

## 2021-07-25 DIAGNOSIS — L853 Xerosis cutis: Secondary | ICD-10-CM

## 2021-07-25 DIAGNOSIS — L578 Other skin changes due to chronic exposure to nonionizing radiation: Secondary | ICD-10-CM

## 2021-07-25 DIAGNOSIS — C44319 Basal cell carcinoma of skin of other parts of face: Secondary | ICD-10-CM

## 2021-07-25 DIAGNOSIS — L720 Epidermal cyst: Secondary | ICD-10-CM | POA: Diagnosis not present

## 2021-07-25 DIAGNOSIS — L570 Actinic keratosis: Secondary | ICD-10-CM

## 2021-07-25 DIAGNOSIS — D692 Other nonthrombocytopenic purpura: Secondary | ICD-10-CM

## 2021-07-25 DIAGNOSIS — B353 Tinea pedis: Secondary | ICD-10-CM | POA: Diagnosis not present

## 2021-07-25 DIAGNOSIS — B351 Tinea unguium: Secondary | ICD-10-CM | POA: Diagnosis not present

## 2021-07-25 DIAGNOSIS — L821 Other seborrheic keratosis: Secondary | ICD-10-CM

## 2021-07-25 DIAGNOSIS — D229 Melanocytic nevi, unspecified: Secondary | ICD-10-CM

## 2021-07-25 DIAGNOSIS — Z1283 Encounter for screening for malignant neoplasm of skin: Secondary | ICD-10-CM | POA: Diagnosis not present

## 2021-07-25 DIAGNOSIS — D1800 Hemangioma unspecified site: Secondary | ICD-10-CM

## 2021-07-25 DIAGNOSIS — L814 Other melanin hyperpigmentation: Secondary | ICD-10-CM

## 2021-07-25 MED ORDER — MUPIROCIN 2 % EX OINT: 1.0000 | TOPICAL_OINTMENT | Freq: Every day | CUTANEOUS | 2 refills | Status: DC

## 2021-07-25 MED ORDER — CICLOPIROX OLAMINE 0.77 % EX CREA: TOPICAL_CREAM | Freq: Two times a day (BID) | CUTANEOUS | 3 refills | Status: DC

## 2021-07-25 NOTE — Patient Instructions (Signed)
Due to recent changes in healthcare laws, you may see results of your pathology and/or laboratory studies on MyChart before the doctors have had a chance to review them. We understand that in some cases there may be results that are confusing or concerning to you. Please understand that not all results are received at the same time and often the doctors may need to interpret multiple results in order to provide you with the best plan of care or course of treatment. Therefore, we ask that you please give us 2 business days to thoroughly review all your results before contacting the office for clarification. Should we see a critical lab result, you will be contacted sooner.   If You Need Anything After Your Visit  If you have any questions or concerns for your doctor, please call our main line at [phone] and press option 4 to reach your doctor's medical assistant. If no one answers, please leave a voicemail as directed and we will return your call as soon as possible. Messages left after 4 pm will be answered the following business day.   You may also send us a message via MyChart. We typically respond to MyChart messages within 1-2 business days.  For prescription refills, please ask your pharmacy to contact our office. Our fax number is [phone].  If you have an urgent issue when the clinic is closed that cannot wait until the next business day, you can page your doctor at the number below.    Please note that while we do our best to be available for urgent issues outside of office hours, we are not available 24/7.   If you have an urgent issue and are unable to reach us, you may choose to seek medical care at your doctor's office, retail clinic, urgent care center, or emergency room.  If you have a medical emergency, please immediately call 911 or go to the emergency department.  Pager Numbers  - Dr. Kowalski: [phone]  - Dr. Moye: [phone]  - Dr. Stewart:  [phone]  In the event of inclement weather, please call our main line at [phone] for an update on the status of any delays or closures.  Dermatology Medication Tips: Please keep the boxes that topical medications come in in order to help keep track of the instructions about where and how to use these. Pharmacies typically print the medication instructions only on the boxes and not directly on the medication tubes.   If your medication is too expensive, please contact our office at [phone] option 4 or send us a message through MyChart.   We are unable to tell what your co-pay for medications will be in advance as this is different depending on your insurance coverage. However, we may be able to find a substitute medication at lower cost or fill out paperwork to get insurance to cover a needed medication.   If a prior authorization is required to get your medication covered by your insurance company, please allow us 1-2 business days to complete this process.  Drug prices often vary depending on where the prescription is filled and some pharmacies may offer cheaper prices.  The website www.goodrx.com contains coupons for medications through different pharmacies. The prices here do not account for what the cost may be with help from insurance (it may be cheaper with your insurance), but the website can give you the price if you did not use any insurance.  - You can print the associated coupon and take it with   your prescription to the pharmacy.  - You may also stop by our office during regular business hours and pick up a GoodRx coupon card.  - If you need your prescription sent electronically to a different pharmacy, notify our office through [place] MyChart or by phone at [phone] option 4.     Si Usted Necesita Algo Despus de Su Visita  Tambin puede enviarnos un mensaje a travs de MyChart. Por lo general respondemos a los mensajes de MyChart en el transcurso de 1 a 2  das hbiles.  Para renovar recetas, por favor pida a su farmacia que se ponga en contacto con nuestra oficina. Nuestro nmero de fax es el [phone].  Si tiene un asunto urgente cuando la clnica est cerrada y que no puede esperar hasta el siguiente da hbil, puede llamar/localizar a su doctor(a) al nmero que aparece a continuacin.   Por favor, tenga en cuenta que aunque hacemos todo lo posible para estar disponibles para asuntos urgentes fuera del horario de oficina, no estamos disponibles las 24 horas del da, los 7 das de la semana.   Si tiene un problema urgente y no puede comunicarse con nosotros, puede optar por buscar atencin mdica  en el consultorio de su doctor(a), en una clnica privada, en un centro de atencin urgente o en una sala de emergencias.  Si tiene una emergencia mdica, por favor llame inmediatamente al 911 o vaya a la sala de emergencias.  Nmeros de bper  - Dr. Kowalski: [phone]  - Dra. Moye: [phone]  - Dra. Stewart: [phone]  En caso de inclemencias del tiempo, por favor llame a nuestra lnea principal al [phone] para una actualizacin sobre el estado de cualquier retraso o cierre.  Consejos para la medicacin en dermatologa: Por favor, guarde las cajas en las que vienen los medicamentos de uso tpico para ayudarle a seguir las instrucciones sobre dnde y cmo usarlos. Las farmacias generalmente imprimen las instrucciones del medicamento slo en las cajas y no directamente en los tubos del medicamento.   Si su medicamento es muy caro, por favor, pngase en contacto con nuestra oficina llamando al [phone] y presione la opcin 4 o envenos un mensaje a travs de MyChart.   No podemos decirle cul ser su copago por los medicamentos por adelantado ya que esto es diferente dependiendo de la cobertura de su seguro. Sin embargo, es posible que podamos encontrar un medicamento sustituto a menor costo o llenar un formulario para que el  seguro cubra el medicamento que se considera necesario.   Si se requiere una autorizacin previa para que su compaa de seguros cubra su medicamento, por favor permtanos de 1 a 2 das hbiles para completar este proceso.  Los precios de los medicamentos varan con frecuencia dependiendo del lugar de dnde se surte la receta y alguna farmacias pueden ofrecer precios ms baratos.  El sitio web www.goodrx.com tiene cupones para medicamentos de diferentes farmacias. Los precios aqu no tienen en cuenta lo que podra costar con la ayuda del seguro (puede ser ms barato con su seguro), pero el sitio web puede darle el precio si no utiliz ningn seguro.  - Puede imprimir el cupn correspondiente y llevarlo con su receta a la farmacia.  - Tambin puede pasar por nuestra oficina durante el horario de atencin regular y recoger una tarjeta de cupones de GoodRx.  - Si necesita que su receta se enve electrnicamente a una farmacia diferente, informe a nuestra oficina a travs de MyChart de [place]   o por telfono llamando al [phone] y presione la opcin 4.  

## 2021-07-25 NOTE — Progress Notes (Signed)
Follow-Up Visit   Subjective  [name] is a 88 y.o. male who presents for the following: Irregualr skin lesions (On the L side, ears, nose, and legs - patient is worried they may be cancerous. ). The patient presents for Total-Body Skin Exam (TBSE) for skin cancer screening and mole check.  The patient has spots, moles and lesions to be evaluated, some may be new or changing and the patient has concerns that these could be cancer.   The following portions of the chart were reviewed this encounter and updated as appropriate:   Tobacco  Allergies  Meds  Problems  Med Hx  Surg Hx  Fam Hx      Review of Systems:  No other skin or systemic complaints except as noted in HPI or Assessment and Plan.  Objective  Well appearing patient in no apparent distress; mood and affect are within normal limits.  A full examination was performed including scalp, head, eyes, ears, nose, lips, neck, chest, axillae, abdomen, back, buttocks, bilateral upper extremities, bilateral lower extremities, hands, feet, fingers, toes, fingernails, and toenails. All findings within normal limits unless otherwise noted below.  B/L foot Scale of the feet and toenail dystrophy.  L pretibia x 1, R hand dorsum x 1 Erythematous thin papules/macules with gritty scale.   Nasal dorsum 0.5 cm white papule.      L cheek 0.4 cm pink papule with irregular vascular pattern.      Nasal dorsum Erythematous thin papules/macules with gritty scale.     Assessment & Plan  Tinea pedis of both feet B/L foot  With tinea unguium -   Start Ciclopirox cream BID to feet and between toes for a month then QW thereafter to help prevent recurrence  ciclopirox (LOPROX) 0.77 % cream - B/L foot Apply topically 2 (two) times daily. Apply to feet BID x 1 month. Then QW thereafter.  AK (actinic keratosis) L pretibia x 1, R hand dorsum x 1  Start Mupirocin 2% ointment to aa QD then cover with  bandage.  Destruction of lesion - L pretibia x 1, R hand dorsum x 1 Complexity: simple   Destruction method: cryotherapy   Informed consent: discussed and consent obtained   Timeout:  patient name, date of birth, surgical site, and procedure verified Lesion destroyed using liquid nitrogen: Yes   Region frozen until ice ball extended beyond lesion: Yes   Outcome: patient tolerated procedure well with no complications   Post-procedure details: wound care instructions given   Additional details:  Prior to procedure, discussed risks of blister formation, small wound, skin dyspigmentation, or rare scar following cryotherapy. Recommend Vaseline ointment to treated areas while healing.   Neoplasm of uncertain behavior of skin (2) Nasal dorsum  Skin / nail biopsy Type of biopsy: incisional   Informed consent: discussed and consent obtained   Timeout: patient name, date of birth, surgical site, and procedure verified   Anesthesia: the lesion was anesthetized in a standard fashion   Anesthetic:  1% lidocaine w/ epinephrine 1-100,000 local infiltration Instrument used: #15 blade   Suture size:  5-0 Suture type: Prolene (polypropylene)   Suture removal (days):  7 Hemostasis achieved with: suture, pressure and aluminum chloride   Outcome: patient tolerated procedure well   Post-procedure details: wound care instructions given   Additional details:  Mupirocin and a bandage applied  Specimen 2 - Surgical pathology Differential Diagnosis: D48.5 r/o cyst vs other   Check Margins: No  L cheek  Skin /  nail biopsy Type of biopsy: punch   Informed consent: discussed and consent obtained   Timeout: patient name, date of birth, surgical site, and procedure verified   Procedure prep:  Patient was prepped and draped in usual sterile fashion (the patient was cleaned and prepped) Prep type:  Isopropyl alcohol Anesthesia: the lesion was anesthetized in a standard fashion   Anesthetic:  1% lidocaine  w/ epinephrine 1-100,000 buffered w/ 8.4% NaHCO3 Punch size:  4 mm Suture size:  5-0 Suture type: Prolene (polypropylene)   Hemostasis achieved with: suture, pressure and aluminum chloride   Outcome: patient tolerated procedure well   Post-procedure details: sterile dressing applied and wound care instructions given   Dressing type: bandage, petrolatum and pressure dressing    Specimen 1 - Surgical pathology Differential Diagnosis: D48.5 r/o BCC vs other  Check Margins: No  Hypertrophic actinic keratosis Nasal dorsum  Plan to treat at suture removal appointment.    Lentigines - Scattered tan macules - Due to sun exposure - Benign-appearing, observe - Recommend daily broad spectrum sunscreen SPF 30+ to sun-exposed areas, reapply every 2 hours as needed. - Call for any changes  Seborrheic Keratoses - Stuck-on, waxy, tan-brown papules and/or plaques  - Benign-appearing - Discussed benign etiology and prognosis. - Observe - Call for any changes  Melanocytic Nevi - Tan-brown and/or pink-flesh-colored symmetric macules and papules - Benign appearing on exam today - Observation - Call clinic for new or changing moles - Recommend daily use of broad spectrum spf 30+ sunscreen to sun-exposed areas.   Hemangiomas - Red papules - Discussed benign nature - Observe - Call for any changes  Actinic Damage - Chronic condition, secondary to cumulative UV/sun exposure - diffuse scaly erythematous macules with underlying dyspigmentation - Recommend daily broad spectrum sunscreen SPF 30+ to sun-exposed areas, reapply every 2 hours as needed.  - Staying in the shade or wearing long sleeves, sun glasses (UVA+UVB protection) and wide brim hats (4-inch brim around the entire circumference of the hat) are also recommended for sun protection.  - Call for new or changing lesions.  Purpura - Chronic; persistent and recurrent.  Treatable, but not curable. - Violaceous macules and patches -  Benign - Related to trauma, age, sun damage and/or use of blood thinners, chronic use of topical and/or oral steroids - Observe - Can use OTC arnica containing moisturizer such as Dermend Bruise Formula if desired - Call for worsening or other concerns  Xerosis - diffuse xerotic patches - recommend gentle, hydrating skin care - gentle skin care handout given  Skin cancer screening performed today.  Return in about 1 month (around 08/24/2021) for AK follow up; 1 wk suture removal .  I, Rudell Cobb, CMA, am acting as scribe for Forest Gleason, MD .  Documentation: I have reviewed the above documentation for accuracy and completeness, and I agree with the above.  Forest Gleason, MD

## 2021-07-31 ENCOUNTER — Encounter: Payer: Self-pay | Admitting: Dermatology

## 2021-08-01 ENCOUNTER — Encounter: Payer: Self-pay | Admitting: Dermatology

## 2021-08-01 ENCOUNTER — Ambulatory Visit (INDEPENDENT_AMBULATORY_CARE_PROVIDER_SITE_OTHER): Payer: Medicare Other | Admitting: Dermatology

## 2021-08-01 DIAGNOSIS — C44319 Basal cell carcinoma of skin of other parts of face: Secondary | ICD-10-CM | POA: Diagnosis not present

## 2021-08-01 DIAGNOSIS — Z5189 Encounter for other specified aftercare: Secondary | ICD-10-CM | POA: Diagnosis not present

## 2021-08-01 DIAGNOSIS — B353 Tinea pedis: Secondary | ICD-10-CM | POA: Diagnosis not present

## 2021-08-01 DIAGNOSIS — L570 Actinic keratosis: Secondary | ICD-10-CM

## 2021-08-01 DIAGNOSIS — B351 Tinea unguium: Secondary | ICD-10-CM | POA: Diagnosis not present

## 2021-08-01 MED ORDER — CICLOPIROX OLAMINE 0.77 % EX SUSP: CUTANEOUS | 5 refills | Status: DC

## 2021-08-01 NOTE — Patient Instructions (Addendum)
Cryotherapy Aftercare  Wash gently with soap and water everyday.   Apply Vaseline daily until healed.    Due to recent changes in healthcare laws, you may see results of your pathology and/or laboratory studies on MyChart before the doctors have had a chance to review them. We understand that in some cases there may be results that are confusing or concerning to you. Please understand that not all results are received at the same time and often the doctors may need to interpret multiple results in order to provide you with the best plan of care or course of treatment. Therefore, we ask that you please give us 2 business days to thoroughly review all your results before contacting the office for clarification. Should we see a critical lab result, you will be contacted sooner.   If You Need Anything After Your Visit  If you have any questions or concerns for your doctor, please call our main line at [phone] and press option 4 to reach your doctor's medical assistant. If no one answers, please leave a voicemail as directed and we will return your call as soon as possible. Messages left after 4 pm will be answered the following business day.   You may also send us a message via MyChart. We typically respond to MyChart messages within 1-2 business days.  For prescription refills, please ask your pharmacy to contact our office. Our fax number is [phone].  If you have an urgent issue when the clinic is closed that cannot wait until the next business day, you can page your doctor at the number below.    Please note that while we do our best to be available for urgent issues outside of office hours, we are not available 24/7.   If you have an urgent issue and are unable to reach us, you may choose to seek medical care at your doctor's office, retail clinic, urgent care center, or emergency room.  If you have a medical emergency, please immediately call 911 or go to the emergency  department.  Pager Numbers  - Dr. Kowalski: [phone]  - Dr. Moye: [phone]  - Dr. Stewart: [phone]  In the event of inclement weather, please call our main line at [phone] for an update on the status of any delays or closures.  Dermatology Medication Tips: Please keep the boxes that topical medications come in in order to help keep track of the instructions about where and how to use these. Pharmacies typically print the medication instructions only on the boxes and not directly on the medication tubes.   If your medication is too expensive, please contact our office at [phone] option 4 or send us a message through MyChart.   We are unable to tell what your co-pay for medications will be in advance as this is different depending on your insurance coverage. However, we may be able to find a substitute medication at lower cost or fill out paperwork to get insurance to cover a needed medication.   If a prior authorization is required to get your medication covered by your insurance company, please allow us 1-2 business days to complete this process.  Drug prices often vary depending on where the prescription is filled and some pharmacies may offer cheaper prices.  The website www.goodrx.com contains coupons for medications through different pharmacies. The prices here do not account for what the cost may be with help from insurance (it may be cheaper with your insurance), but the website can give you the   price if you did not use any insurance.  - You can print the associated coupon and take it with your prescription to the pharmacy.  - You may also stop by our office during regular business hours and pick up a GoodRx coupon card.  - If you need your prescription sent electronically to a different pharmacy, notify our office through [facility] MyChart or by phone at [phone] option 4.     Si Usted Necesita Algo Despus de Su Visita  Tambin puede enviarnos un  mensaje a travs de MyChart. Por lo general respondemos a los mensajes de MyChart en el transcurso de 1 a 2 das hbiles.  Para renovar recetas, por favor pida a su farmacia que se ponga en contacto con nuestra oficina. Nuestro nmero de fax es el [phone].  Si tiene un asunto urgente cuando la clnica est cerrada y que no puede esperar hasta el siguiente da hbil, puede llamar/localizar a su doctor(a) al nmero que aparece a continuacin.   Por favor, tenga en cuenta que aunque hacemos todo lo posible para estar disponibles para asuntos urgentes fuera del horario de oficina, no estamos disponibles las 24 horas del da, los 7 das de la semana.   Si tiene un problema urgente y no puede comunicarse con nosotros, puede optar por buscar atencin mdica  en el consultorio de su doctor(a), en una clnica privada, en un centro de atencin urgente o en una sala de emergencias.  Si tiene una emergencia mdica, por favor llame inmediatamente al 911 o vaya a la sala de emergencias.  Nmeros de bper  - Dr. Kowalski: [phone]  - Dra. Moye: [phone]  - Dra. Stewart: [phone]  En caso de inclemencias del tiempo, por favor llame a nuestra lnea principal al [phone] para una actualizacin sobre el estado de cualquier retraso o cierre.  Consejos para la medicacin en dermatologa: Por favor, guarde las cajas en las que vienen los medicamentos de uso tpico para ayudarle a seguir las instrucciones sobre dnde y cmo usarlos. Las farmacias generalmente imprimen las instrucciones del medicamento slo en las cajas y no directamente en los tubos del medicamento.   Si su medicamento es muy caro, por favor, pngase en contacto con nuestra oficina llamando al [phone] y presione la opcin 4 o envenos un mensaje a travs de MyChart.   No podemos decirle cul ser su copago por los medicamentos por adelantado ya que esto es diferente dependiendo de la cobertura de su seguro. Sin embargo,  es posible que podamos encontrar un medicamento sustituto a menor costo o llenar un formulario para que el seguro cubra el medicamento que se considera necesario.   Si se requiere una autorizacin previa para que su compaa de seguros cubra su medicamento, por favor permtanos de 1 a 2 das hbiles para completar este proceso.  Los precios de los medicamentos varan con frecuencia dependiendo del lugar de dnde se surte la receta y alguna farmacias pueden ofrecer precios ms baratos.  El sitio web www.goodrx.com tiene cupones para medicamentos de diferentes farmacias. Los precios aqu no tienen en cuenta lo que podra costar con la ayuda del seguro (puede ser ms barato con su seguro), pero el sitio web puede darle el precio si no utiliz ningn seguro.  - Puede imprimir el cupn correspondiente y llevarlo con su receta a la farmacia.  - Tambin puede pasar por nuestra oficina durante el horario de atencin regular y recoger una tarjeta de cupones de GoodRx.  - Si necesita que   su receta se enve electrnicamente a una farmacia diferente, informe a nuestra oficina a travs de MyChart de [facility] o por telfono llamando al [phone] y presione la opcin 4.  

## 2021-08-01 NOTE — Progress Notes (Signed)
Follow-Up Visit   Subjective  [name] is a 88 y.o. male who presents for the following: Suture / Staple Removal (Nose. Dx as epidermal cyst) and Skin Cancer (Bx proven BCC at left cheek. Here to discuss treatment options. Bx: 07/25/2021).  The patient has spots, moles and lesions to be evaluated, some may be new or changing and the patient has concerns that these could be cancer.  Patient accompanied by daughter, who contributes to history.   The following portions of the chart were reviewed this encounter and updated as appropriate:  Tobacco  Allergies  Meds  Problems  Med Hx  Surg Hx  Fam Hx      Review of Systems: No other skin or systemic complaints except as noted in HPI or Assessment and Plan.   Objective  Well appearing patient in no apparent distress; mood and affect are within normal limits.  A focused examination was performed including face. Relevant physical exam findings are noted in the Assessment and Plan.  left cheek Pink healing biopsy site  left pretibia Erosion at left prebia  Right Hand - Posterior x1 (repeat freeze within global period), nose x1 Erythematous thin papules/macules with gritty scale.   fingernails Onycholysis with subungual debris at multiple fingernails  B/L feet Scaling and maceration web spaces and over distal and lateral soles.    Assessment & Plan   Encounter for Removal of Sutures - Incision site at the nose is clean, dry and intact - Wound cleansed, sutures removed, wound cleansed and steri strips applied.  - Discussed pathology results showing epidermoid cyst  - Patient advised to keep steri-strips dry until they fall off. - Scars remodel for a full year. - Once steri-strips fall off, patient can apply over-the-counter silicone scar cream each night to help with scar remodeling if desired. - Patient advised to call with any concerns or if they notice any new or changing lesions.  Basal cell carcinoma (BCC)  of skin of other part of face left cheek  Discussed EDC vs Mohs surgery vs Excision. Patient prefers [place]. Will RTC in 6 weeks for [facility].   Visit for wound check left pretibia  S/P AK treatment with LN2   Continue wound care  AK (actinic keratosis) Right Hand - Posterior x1 (repeat freeze within global period), nose x1  Actinic keratoses are precancerous spots that appear secondary to cumulative UV radiation exposure/sun exposure over time. They are chronic with expected duration over 1 year. A portion of actinic keratoses will progress to squamous cell carcinoma of the skin. It is not possible to reliably predict which spots will progress to skin cancer and so treatment is recommended to prevent development of skin cancer.  Recommend daily broad spectrum sunscreen SPF 30+ to sun-exposed areas, reapply every 2 hours as needed.  Recommend staying in the shade or wearing long sleeves, sun glasses (UVA+UVB protection) and wide brim hats (4-inch brim around the entire circumference of the hat). Call for new or changing lesions.  Right hand site within global period - repeat freeze Nose is first time treated  Destruction of lesion - Right Hand - Posterior x1 (repeat freeze within global period), nose x1  Destruction method: cryotherapy   Informed consent: discussed and consent obtained   Lesion destroyed using liquid nitrogen: Yes   Outcome: patient tolerated procedure well with no complications   Post-procedure details: wound care instructions given   Additional details:  Prior to procedure, discussed risks of blister formation, small wound, skin  dyspigmentation, or rare scar following cryotherapy. Recommend Vaseline ointment to treated areas while healing.   Onychomycosis fingernails  Start Ciclopirox suspension once daily to fingernails. Advised topicals work in up to around 20% of patients.   ciclopirox (LOPROX) 0.77 % SUSP - fingernails Apply once daily to affected  fingernails  Tinea pedis of right foot B/L feet  Continue Ciclopirox cream twice daily as directed.    Return for EDC of BCC in 6 weeks.  I, Emelia Salisbury, CMA, am acting as scribe for Forest Gleason, MD.  Documentation: I have reviewed the above documentation for accuracy and completeness, and I agree with the above.  Forest Gleason, MD

## 2021-08-06 ENCOUNTER — Ambulatory Visit: Payer: Medicare Other

## 2021-08-06 DIAGNOSIS — C44319 Basal cell carcinoma of skin of other parts of face: Secondary | ICD-10-CM

## 2021-08-06 NOTE — Progress Notes (Unsigned)
   Follow-Up Visit   Subjective  [name] is a 88 y.o. male who presents for the following: Follow-up (Suture removal at the left cheek biopsy proven biopsy proven BCC ).  Daughter with patient   The following portions of the chart were reviewed this encounter and updated as appropriate:       Review of Systems:  No other skin or systemic complaints except as noted in HPI or Assessment and Plan.  Objective  Well appearing patient in no apparent distress; mood and affect are within normal limits.  A focused examination was performed including face. Relevant physical exam findings are noted in the Assessment and Plan.  left cheek Well healed scar    Assessment & Plan  Basal cell carcinoma (BCC) of skin of other part of face left cheek  Biopsy results discussed with patient   Encounter for Removal of Sutures - Incision site at the left cheek is clean, dry and intact - Wound cleansed, sutures removed, wound cleansed and steri strips applied.  - Discussed pathology results showing  BASAL CELL CARCINOMA, INFUNDIBULOCYSTIC TYPE   - Patient advised to keep steri-strips dry until they fall off. - Scars remodel for a full year. - Once steri-strips fall off, patient can apply over-the-counter silicone scar cream each night to help with scar remodeling if desired. - Patient advised to call with any concerns or if they notice any new or changing lesions.    Return for f/u 09/13/2021 as scheduled.  I, Marye Round, CMA, am acting as scribe for [employer] .

## 2021-08-06 NOTE — Patient Instructions (Signed)
Due to recent changes in healthcare laws, you may see results of your pathology and/or laboratory studies on MyChart before the doctors have had a chance to review them. We understand that in some cases there may be results that are confusing or concerning to you. Please understand that not all results are received at the same time and often the doctors may need to interpret multiple results in order to provide you with the best plan of care or course of treatment. Therefore, we ask that you please give us 2 business days to thoroughly review all your results before contacting the office for clarification. Should we see a critical lab result, you will be contacted sooner.   If You Need Anything After Your Visit  If you have any questions or concerns for your doctor, please call our main line at [phone] and press option 4 to reach your doctor's medical assistant. If no one answers, please leave a voicemail as directed and we will return your call as soon as possible. Messages left after 4 pm will be answered the following business day.   You may also send us a message via MyChart. We typically respond to MyChart messages within 1-2 business days.  For prescription refills, please ask your pharmacy to contact our office. Our fax number is [phone].  If you have an urgent issue when the clinic is closed that cannot wait until the next business day, you can page your doctor at the number below.    Please note that while we do our best to be available for urgent issues outside of office hours, we are not available 24/7.   If you have an urgent issue and are unable to reach us, you may choose to seek medical care at your doctor's office, retail clinic, urgent care center, or emergency room.  If you have a medical emergency, please immediately call 911 or go to the emergency department.  Pager Numbers  - Dr. Kowalski: [phone]  - Dr. Moye: [phone]  - Dr. Stewart:  [phone]  In the event of inclement weather, please call our main line at [phone] for an update on the status of any delays or closures.  Dermatology Medication Tips: Please keep the boxes that topical medications come in in order to help keep track of the instructions about where and how to use these. Pharmacies typically print the medication instructions only on the boxes and not directly on the medication tubes.   If your medication is too expensive, please contact our office at [phone] option 4 or send us a message through MyChart.   We are unable to tell what your co-pay for medications will be in advance as this is different depending on your insurance coverage. However, we may be able to find a substitute medication at lower cost or fill out paperwork to get insurance to cover a needed medication.   If a prior authorization is required to get your medication covered by your insurance company, please allow us 1-2 business days to complete this process.  Drug prices often vary depending on where the prescription is filled and some pharmacies may offer cheaper prices.  The website www.goodrx.com contains coupons for medications through different pharmacies. The prices here do not account for what the cost may be with help from insurance (it may be cheaper with your insurance), but the website can give you the price if you did not use any insurance.  - You can print the associated coupon and take it with   your prescription to the pharmacy.  - You may also stop by our office during regular business hours and pick up a GoodRx coupon card.  - If you need your prescription sent electronically to a different pharmacy, notify our office through [place] MyChart or by phone at [phone] option 4.     Si Usted Necesita Algo Despus de Su Visita  Tambin puede enviarnos un mensaje a travs de MyChart. Por lo general respondemos a los mensajes de MyChart en el transcurso de 1 a 2  das hbiles.  Para renovar recetas, por favor pida a su farmacia que se ponga en contacto con nuestra oficina. Nuestro nmero de fax es el [phone].  Si tiene un asunto urgente cuando la clnica est cerrada y que no puede esperar hasta el siguiente da hbil, puede llamar/localizar a su doctor(a) al nmero que aparece a continuacin.   Por favor, tenga en cuenta que aunque hacemos todo lo posible para estar disponibles para asuntos urgentes fuera del horario de oficina, no estamos disponibles las 24 horas del da, los 7 das de la semana.   Si tiene un problema urgente y no puede comunicarse con nosotros, puede optar por buscar atencin mdica  en el consultorio de su doctor(a), en una clnica privada, en un centro de atencin urgente o en una sala de emergencias.  Si tiene una emergencia mdica, por favor llame inmediatamente al 911 o vaya a la sala de emergencias.  Nmeros de bper  - Dr. Kowalski: [phone]  - Dra. Moye: [phone]  - Dra. Stewart: [phone]  En caso de inclemencias del tiempo, por favor llame a nuestra lnea principal al [phone] para una actualizacin sobre el estado de cualquier retraso o cierre.  Consejos para la medicacin en dermatologa: Por favor, guarde las cajas en las que vienen los medicamentos de uso tpico para ayudarle a seguir las instrucciones sobre dnde y cmo usarlos. Las farmacias generalmente imprimen las instrucciones del medicamento slo en las cajas y no directamente en los tubos del medicamento.   Si su medicamento es muy caro, por favor, pngase en contacto con nuestra oficina llamando al [phone] y presione la opcin 4 o envenos un mensaje a travs de MyChart.   No podemos decirle cul ser su copago por los medicamentos por adelantado ya que esto es diferente dependiendo de la cobertura de su seguro. Sin embargo, es posible que podamos encontrar un medicamento sustituto a menor costo o llenar un formulario para que el  seguro cubra el medicamento que se considera necesario.   Si se requiere una autorizacin previa para que su compaa de seguros cubra su medicamento, por favor permtanos de 1 a 2 das hbiles para completar este proceso.  Los precios de los medicamentos varan con frecuencia dependiendo del lugar de dnde se surte la receta y alguna farmacias pueden ofrecer precios ms baratos.  El sitio web www.goodrx.com tiene cupones para medicamentos de diferentes farmacias. Los precios aqu no tienen en cuenta lo que podra costar con la ayuda del seguro (puede ser ms barato con su seguro), pero el sitio web puede darle el precio si no utiliz ningn seguro.  - Puede imprimir el cupn correspondiente y llevarlo con su receta a la farmacia.  - Tambin puede pasar por nuestra oficina durante el horario de atencin regular y recoger una tarjeta de cupones de GoodRx.  - Si necesita que su receta se enve electrnicamente a una farmacia diferente, informe a nuestra oficina a travs de MyChart de [facility] [place]   o por telfono llamando al [phone] y presione la opcin 4.  

## 2021-08-07 ENCOUNTER — Other Ambulatory Visit (INDEPENDENT_AMBULATORY_CARE_PROVIDER_SITE_OTHER): Payer: Self-pay | Admitting: Nurse Practitioner

## 2021-08-07 ENCOUNTER — Encounter (INDEPENDENT_AMBULATORY_CARE_PROVIDER_SITE_OTHER): Payer: Self-pay | Admitting: Nurse Practitioner

## 2021-08-07 ENCOUNTER — Ambulatory Visit (INDEPENDENT_AMBULATORY_CARE_PROVIDER_SITE_OTHER): Payer: Medicare Other

## 2021-08-07 ENCOUNTER — Encounter: Payer: Self-pay | Admitting: Dermatology

## 2021-08-07 ENCOUNTER — Ambulatory Visit (INDEPENDENT_AMBULATORY_CARE_PROVIDER_SITE_OTHER): Payer: Medicare Other | Admitting: Nurse Practitioner

## 2021-08-07 VITALS — BP 155/68 | HR 69 | Resp 17

## 2021-08-07 DIAGNOSIS — I7025 Atherosclerosis of native arteries of other extremities with ulceration: Secondary | ICD-10-CM

## 2021-08-07 DIAGNOSIS — L97909 Non-pressure chronic ulcer of unspecified part of unspecified lower leg with unspecified severity: Secondary | ICD-10-CM | POA: Diagnosis not present

## 2021-08-07 DIAGNOSIS — I70299 Other atherosclerosis of native arteries of extremities, unspecified extremity: Secondary | ICD-10-CM | POA: Diagnosis not present

## 2021-08-07 DIAGNOSIS — I10 Essential (primary) hypertension: Secondary | ICD-10-CM

## 2021-08-07 DIAGNOSIS — E785 Hyperlipidemia, unspecified: Secondary | ICD-10-CM

## 2021-08-09 ENCOUNTER — Other Ambulatory Visit (INDEPENDENT_AMBULATORY_CARE_PROVIDER_SITE_OTHER): Payer: Self-pay

## 2021-08-09 ENCOUNTER — Telehealth (INDEPENDENT_AMBULATORY_CARE_PROVIDER_SITE_OTHER): Payer: Self-pay | Admitting: Nurse Practitioner

## 2021-08-09 MED ORDER — ATORVASTATIN CALCIUM 20 MG PO TABS: 20.0000 mg | ORAL_TABLET | Freq: Every day | ORAL | 3 refills | Status: DC

## 2021-08-09 NOTE — Telephone Encounter (Signed)
Spoke with Daughter [name] let her know RX was sent she stated verbal understanding

## 2021-08-09 NOTE — Telephone Encounter (Signed)
Daughter called in needing refill on medication they wouldn't fill it because [name] was the provider on the Rx.Marland Kitchen   atorvastatin (LIPITOR) 20 MG tablet   [place], [place]   Asking if this can be sent in today if possible     Please call and advise

## 2021-08-09 NOTE — Telephone Encounter (Signed)
Sent refill to pharmacy with Eulogio Ditch NP as the prescriber

## 2021-08-16 ENCOUNTER — Other Ambulatory Visit (HOSPITAL_COMMUNITY): Payer: Self-pay

## 2021-08-19 ENCOUNTER — Encounter (INDEPENDENT_AMBULATORY_CARE_PROVIDER_SITE_OTHER): Payer: Self-pay | Admitting: Nurse Practitioner

## 2021-08-19 NOTE — Progress Notes (Signed)
Subjective:    Patient ID: [name], male    DOB: August 21, 1928, 88 y.o.   MRN: [id number] No chief complaint on file.   The patient returns today for noninvasive studies related to his lower extremity perfusion.  The patient does note some discomfort in his lower extremities but no extensive rest pain or open ulcerations.  Today the patient's noninvasive studies appear improved however it is noted that the patient has several occlusions that were not amenable to intervention.  Overall he is improved.  Today the patient's right ABI is 0.92 and his left is 1.10.  He has monophasic waveforms his right tibial arteries with biphasic in his left.    Review of Systems  Neurological:  Positive for weakness.  All other systems reviewed and are negative.      Objective:   Physical Exam Vitals reviewed.  HENT:     Head: Normocephalic.  Cardiovascular:     Rate and Rhythm: Normal rate.     Pulses:          Dorsalis pedis pulses are detected w/ Doppler on the right side and detected w/ Doppler on the left side.       Posterior tibial pulses are detected w/ Doppler on the right side and detected w/ Doppler on the left side.  Pulmonary:     Effort: Pulmonary effort is normal.  Skin:    General: Skin is warm and dry.  Neurological:     Mental Status: He is alert and oriented to person, place, and time.     Motor: Weakness present.  Psychiatric:        Mood and Affect: Mood normal.        Behavior: Behavior normal.        Thought Content: Thought content normal.        Judgment: Judgment normal.     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   Thoracic aortic ectasia (HCC)    a.) measured 3.6 x 3.3 cm on 10/08/2019    Social History   Socioeconomic History   Marital status: Married    Spouse name: Not on file   Number of children: Not on file   Years of education: Not on file   Highest education level: Not on file  Occupational History   Not on file  Tobacco Use   Smoking status: Former    Packs/day: 1.00    Years: 30.00    Total pack years: 30.00    Types: Cigarettes    Quit date: 65    Years since quitting: 26.5   Smokeless tobacco: Never  Vaping Use   Vaping Use: Never used  Substance and Sexual Activity   Alcohol use: Never   Drug use: Never   Sexual activity: Not Currently  Other Topics Concern   Not on file  Social History Narrative   Lives at home alone with help from daughter.   Social Determinants of Health   Financial Resource Strain: Not on file  Food Insecurity: Not on file  Transportation Needs: Not on file  Physical Activity: Not on file  Stress: Not on file  Social Connections: Not on file  Intimate Partner Violence: Not on file    Past Surgical History:  Procedure Laterality Date   COLONOSCOPY  2007   ENDARTERECTOMY FEMORAL Right 08/09/2020   Procedure: RIGHT COMMON AND PROFUNDA FEMORAL ENDARTERECTOMY;  Surgeon: Algernon Huxley, MD;  Location: [facility];  Service: Vascular;  Laterality: Right;   EYE SURGERY Bilateral 2005   LOWER EXTREMITY ANGIOGRAPHY Right 07/05/2020   Procedure: LOWER EXTREMITY ANGIOGRAPHY;  Surgeon: Algernon Huxley, MD;  Location: [facility];  Service: Cardiovascular;  Laterality: Right;   Renal Bypass Surgery Bilateral 2000    History reviewed. No pertinent family history.  Allergies  Allergen Reactions   Bee Venom Anaphylaxis       Latest Ref Rng & Units  08/11/2020    5:27 AM 08/10/2020    1:05 AM 08/09/2020   12:40 PM  CBC  WBC 4.0 - 10.5 K/uL 9.7  9.3  6.9   Hemoglobin 13.0 - 17.0 g/dL 9.6  9.2  9.6   Hematocrit 39.0 - 52.0 % 30.3  28.2  29.5   Platelets 150 - 400 K/uL 290  230  237       CMP     Component Value Date/Time   NA 135 03/20/2021 1222   NA 140 02/02/2014 0356   K 4.2 03/20/2021 1222   K 3.7 02/02/2014 0356   CL 93 (L) 03/20/2021 1222   CL 104 02/02/2014 0356   CO2 33 (H) 03/20/2021 1222   CO2 29 02/02/2014 0356   GLUCOSE 94 03/20/2021 1222   GLUCOSE 160 (H) 02/02/2014 0356   BUN 36 (H) 03/20/2021 1222   BUN 33 (H) 02/02/2014 0356   CREATININE 1.21 03/20/2021 1222   CREATININE 0.86 02/02/2014 0356   CALCIUM 10.2 03/20/2021 1222   CALCIUM 8.6 10/10/2019 0305   PROT 7.6 10/08/2019 1150   PROT 7.5 01/29/2014 0949   ALBUMIN 4.0 10/08/2019 1150   ALBUMIN 3.1 (L) 01/29/2014 0949   AST 19 10/08/2019 1150   AST 39 (H) 01/29/2014 0949   ALT 14 10/08/2019 1150   ALT 27 01/29/2014 0949   ALKPHOS 66 10/08/2019 1150   ALKPHOS 79 01/29/2014 0949   BILITOT 1.1 10/08/2019 1150   BILITOT 0.7 01/29/2014 0949   GFRNONAA 57 (L) 03/20/2021 1222   GFRNONAA >60 02/02/2014 0356   GFRNONAA >60 06/25/2011 1325   GFRAA >60 10/15/2019 0634   GFRAA >60 02/02/2014 0356   GFRAA >60 06/25/2011 1325     VAS [place] ABI WITH/WO TBI  Result Date: 08/09/2021  LOWER EXTREMITY DOPPLER STUDY Patient Name:  [name]  Date of Exam:   08/07/2021 Medical Rec #: [id number]          Accession #:    [id number] Date of Birth: 01/19/1929          Patient Gender: M Patient Age:   37 years Exam Location:  [facility] Procedure:      VAS [place] ABI WITH/WO TBI Referring Phys: Eulogio Ditch --------------------------------------------------------------------------------  Indications: Peripheral artery disease.  Performing Technologist: Concha Norway RVT  Examination Guidelines: A complete evaluation includes at minimum, Doppler waveform signals  and systolic blood pressure reading  at the level of bilateral brachial, anterior tibial, and posterior tibial arteries, when vessel segments are accessible. Bilateral testing is considered an integral part of a complete examination. Photoelectric Plethysmograph (PPG) waveforms and toe systolic pressure readings are included as required and additional duplex testing as needed. Limited examinations for reoccurring indications may be performed as noted.  ABI Findings: +---------+------------------+-----+----------+--------+ Right    Rt Pressure (mmHg)IndexWaveform  Comment  +---------+------------------+-----+----------+--------+ Brachial 150                                       +---------+------------------+-----+----------+--------+ ATA                             monophasicNonComp  +---------+------------------+-----+----------+--------+ PTA      138               0.92 monophasic         +---------+------------------+-----+----------+--------+ Great Toe97                0.65 Normal             +---------+------------------+-----+----------+--------+ +---------+------------------+-----+--------+-------+ Left     Lt Pressure (mmHg)IndexWaveformComment +---------+------------------+-----+--------+-------+ Brachial 145                                    +---------+------------------+-----+--------+-------+ ATA      165               1.10 biphasic        +---------+------------------+-----+--------+-------+ PTA      142               0.95 biphasic        +---------+------------------+-----+--------+-------+ Great Toe110               0.73 Normal          +---------+------------------+-----+--------+-------+ +-------+-----------+-----------+------------+------------+ ABI/TBIToday's ABIToday's TBIPrevious ABIPrevious TBI +-------+-----------+-----------+------------+------------+ Right  .92        .65        .90         .45           +-------+-----------+-----------+------------+------------+ Left   1.10       .73        1.11        .50          +-------+-----------+-----------+------------+------------+ Bilateral TBIs appear increased compared to prior study on 11/2020.  Summary: Right: Resting right ankle-brachial index indicates mild right lower extremity arterial disease. The right toe-brachial index is abnormal. Left: Resting left ankle-brachial index is within normal range. No evidence of significant left lower extremity arterial disease. The left toe-brachial index is normal. *See table(s) above for measurements and observations.  Electronically signed by [name] MD on 08/09/2021 at 1:40:03 PM.    Final        Assessment & Plan:   1. Atherosclerosis of artery of extremity with ulceration (HCC) Currently the patient has no open wounds or ulcerations and he has no significant claudication pain or rest pain.  The patient's vessels as evidenced by studies today are heavily calcific and have multiple areas of occlusion.  Following review of the angiogram and discussion with Dr. Lucky Cowboy this is consistent with the previous angiogram that the patient had on 08/09/2020.  The patient does have notable significant collaterals likely helping his  continued perfusion.  Due to the lack of any symptoms as well as no limb threatening symptoms we will continue to watch the patient closely in 80-monthfollow-up to ensure there are no developing symptoms.  The patient and family are advised to contact u[place]if he begins to have open wounds or ulceration or begins to experience rest pain like symptoms.  2. Primary hypertension Continue antihypertensive medications as already ordered, these medications have been reviewed and there are no changes at this time.   3. Hyperlipidemia, unspecified hyperlipidemia type Continue statin as ordered and reviewed, no changes at this time    Current Outpatient Medications on File Prior to Visit  Medication  Sig Dispense Refill   Acetaminophen 500 MG capsule Take by mouth.     albuterol (PROVENTIL) (2.5 MG/3ML) 0.083% nebulizer solution Take 3 mLs (2.5 mg total) by nebulization every 6 (six) hours as needed for wheezing or shortness of breath. 75 mL 0   alendronate (FOSAMAX) 70 MG tablet Take 70 mg by mouth once a week. Sunday morning with full glass of water and sit up do not lay down     apixaban (ELIQUIS) 2.5 MG TABS tablet Take 2.5 mg by mouth 2 (two) times daily.     ascorbic acid (VITAMIN C) 250 MG tablet Take 1 tablet (250 mg total) by mouth 2 (two) times daily. 60 tablet 6   aspirin EC 81 MG EC tablet Take 1 tablet (81 mg total) by mouth daily at 6 (six) AM. Swallow whole. 90 tablet 3   Calcium Carb-Cholecalciferol (CALCIUM 600/VITAMIN D PO) Take by mouth.     CARTIA XT 300 MG 24 hr capsule Take 300 mg by mouth daily.     ciclopirox (LOPROX) 0.77 % cream Apply topically 2 (two) times daily. Apply to feet BID x 1 month. Then QW thereafter. 90 g 3   ciclopirox (LOPROX) 0.77 % SUSP Apply once daily to affected fingernails 30 mL 5   Fluticasone-Umeclidin-Vilant (TRELEGY ELLIPTA) 100-62.5-25 MCG/INH AEPB Inhale into the lungs.     furosemide (LASIX) 20 MG tablet TAKE 1 TABLET BY MOUTH DAILY (Patient taking differently: 20 mg. Two tablets M_W_F and 1 tablet Tues, Thurs, Sat and Sun) 30 tablet 5   metoprolol tartrate (LOPRESSOR) 50 MG tablet Take 50 mg by mouth 2 (two) times daily.     Multiple Vitamin (MULTIVITAMIN WITH MINERALS) TABS tablet Take 1 tablet by mouth daily. 90 tablet 3   mupirocin ointment (BACTROBAN) 2 % Apply 1 Application topically daily. Apply to wounds once daily until healed. 22 g 2   potassium chloride (KLOR-CON) 10 MEQ tablet Take 2 tablets (20 mEq total) by mouth daily. (Patient taking differently: Take 20 mEq by mouth daily. 2 tablets M-W-F 1 tablet Tues, thurs, Sat and Sun) 60 tablet 5   predniSONE (DELTASONE) 10 MG tablet Take 5 mg by mouth daily with breakfast.      Probiotic Product (PROBIOTIC GUMMIES) 30 MG CHEW Chew by mouth.     senna-docusate (SENOKOT-S) 8.6-50 MG tablet Take 1 tablet by mouth daily.     Vitamin D, Ergocalciferol, (DRISDOL) 1.25 MG (50000 UNIT) CAPS capsule Take 1 capsule (50,000 Units total) by mouth every 7 (seven) days. 5 capsule 0   metoprolol succinate (TOPROL-XL) 25 MG 24 hr tablet Take 1 tablet by mouth daily.     oxyCODONE-acetaminophen (PERCOCET/ROXICET) 5-325 MG tablet Take 1 tablet by mouth every 8 (eight) hours as needed for moderate pain. (Patient not taking: Reported on 04/09/2021) 20 tablet  0   Wheat Dextrin (BENEFIBER PO) Take by mouth. 2 teaspoons twice a day (Patient not taking: Reported on 08/07/2021)     No current facility-administered medications on file prior to visit.    There are no Patient Instructions on file for this visit. No follow-ups on file.   Kris Hartmann, NP

## 2021-08-20 NOTE — Progress Notes (Signed)
[name]'s daughter had contacted me and advised she was concerned with her Dad.  She states his breathing was a little shorter than his normal, he agreed.  He wears his oxygen 24/7.  His weight is up 3 lbs over past few days.  Contacted Tina with HF clinic and she advised he could take extra lasix for 1 day and see how the weight goes.  After him taking his normal dose of Lasix on Wednesday his weight was back down on Thursday.  His breathing his doing better and back to his normal.  He states he urinated a lot yesterday.  He has no other complaints.  Will continue to visit for heart failure, diet and medication management.  He just celebrated his birthday also that weekend.   [name] [phone]

## 2021-08-21 ENCOUNTER — Ambulatory Visit: Payer: Medicare Other | Admitting: Dermatology

## 2021-09-05 ENCOUNTER — Encounter (HOSPITAL_COMMUNITY): Payer: Self-pay

## 2021-09-05 ENCOUNTER — Other Ambulatory Visit (HOSPITAL_COMMUNITY): Payer: Self-pay

## 2021-09-05 NOTE — Progress Notes (Signed)
Today had a home visit with [name].  He has been doing good.  Denies any chest pain, headaches or dizziness.  He denies any increase in shortness of breath.  His weight been staying between 144 and 145 lbs.  He states gets up more he is short of breath.  He eats little meals but likes his sweets.  He is able to do simple things around the home.  Daughter is close by or either staying with him.  His daughter is his support system.  He has a niece that visits on Sundays.  He has everything for daily living.  He has a sore on outside of left ankle, appears to be healing.  He has very little edema in legs.  He states been wearing his long socks and helps.  He is urinating good and normal bowel movement as long as he takes his senokot.  Lungs are clear, his oxygen is set at 2.5 lpm most of the time, he has put it up to 3 lpm some.  Will continue to visit for heart failure, diet and medication management.   [place] [phone]

## 2021-09-13 ENCOUNTER — Encounter: Payer: Self-pay | Admitting: Dermatology

## 2021-09-13 ENCOUNTER — Ambulatory Visit (INDEPENDENT_AMBULATORY_CARE_PROVIDER_SITE_OTHER): Payer: Medicare Other | Admitting: Dermatology

## 2021-09-13 DIAGNOSIS — C4491 Basal cell carcinoma of skin, unspecified: Secondary | ICD-10-CM

## 2021-09-13 DIAGNOSIS — L821 Other seborrheic keratosis: Secondary | ICD-10-CM | POA: Diagnosis not present

## 2021-09-13 DIAGNOSIS — Z5189 Encounter for other specified aftercare: Secondary | ICD-10-CM

## 2021-09-13 DIAGNOSIS — C44319 Basal cell carcinoma of skin of other parts of face: Secondary | ICD-10-CM | POA: Diagnosis not present

## 2021-09-13 DIAGNOSIS — L89521 Pressure ulcer of left ankle, stage 1: Secondary | ICD-10-CM

## 2021-09-13 HISTORY — DX: Basal cell carcinoma of skin, unspecified: C44.91

## 2021-09-13 MED ORDER — ZINC OXIDE 40 % EX PSTE: PASTE | CUTANEOUS | 2 refills | Status: DC

## 2021-09-13 NOTE — Patient Instructions (Addendum)
FOR PRESSURE ULCER/SKIN CHANGE Continue donut pad with zinc oxide past in center on left ankle until healed.  Can use Blister band-aids. Once healed can use Allevyn dressing to prevent pressure.   FOR SITE TREATED WITH LIQUID NITROGEN/"FROZEN" AT THE LAST VISIT Continue Mupirocin and band-aid to left leg once daily as directed.    Electrodesiccation and Curettage ("Scrape and Burn") Wound Care Instructions  Leave the original bandage on for 24 hours if possible.  If the bandage becomes soaked or soiled before that time, it is OK to remove it and examine the wound.  A small amount of post-operative bleeding is normal.  If excessive bleeding occurs, remove the bandage, place gauze over the site and apply continuous pressure (no peeking) over the area for 30 minutes. If this does not work, please call our clinic as soon as possible or page your doctor if it is after hours.   Once a day, cleanse the wound with soap and water. It is fine to shower. If a thick crust develops you may use a Q-tip dipped into dilute hydrogen peroxide (mix 1:1 with water) to dissolve it.  Hydrogen peroxide can slow the healing process, so use it only as needed.    After washing, apply petroleum jelly (Vaseline) or an antibiotic ointment if your doctor prescribed one for you, followed by a bandage.    For best healing, the wound should be covered with a layer of ointment at all times. If you are not able to keep the area covered with a bandage to hold the ointment in place, this may mean re-applying the ointment several times a day.  Continue this wound care until the wound has healed and is no longer open. It may take several weeks for the wound to heal and close.  Itching and mild discomfort is normal during the healing process.  If you have any discomfort, you can take Tylenol (acetaminophen) or ibuprofen as directed on the bottle. (Please do not take these if you have an allergy to them or cannot take them for another  reason).  Some redness, tenderness and white or yellow material in the wound is normal healing.  If the area becomes very sore and red, or develops a thick yellow-green material (pus), it may be infected; please notify us.    Wound healing continues for up to one year following surgery. It is not unusual to experience pain in the scar from time to time during the interval.  If the pain becomes severe or the scar thickens, you should notify the office.    A slight amount of redness in a scar is expected for the first six months.  After six months, the redness will fade and the scar will soften and fade.  The color difference becomes less noticeable with time.  If there are any problems, return for a post-op surgery check at your earliest convenience.  To improve the appearance of the scar, you can use silicone scar gel, cream, or sheets (such as Mederma or Serica) every night for up to one year. These are available over the counter (without a prescription).  Please call our office at [phone] for any questions or concerns.   Due to recent changes in healthcare laws, you may see results of your pathology and/or laboratory studies on MyChart before the doctors have had a chance to review them. We understand that in some cases there may be results that are confusing or concerning to you. Please understand that not all  results are received at the same time and often the doctors may need to interpret multiple results in order to provide you with the best plan of care or course of treatment. Therefore, we ask that you please give [place] 2 business days to thoroughly review all your results before contacting the office for clarification. Should we see a critical lab result, you will be contacted sooner.   If You Need Anything After Your Visit  If you have any questions or concerns for your doctor, please call our main line at [phone] and press option 4 to reach your doctor's medical assistant. If no  one answers, please leave a voicemail as directed and we will return your call as soon as possible. Messages left after 4 pm will be answered the following business day.   You may also send [place] a message via [place]. We typically respond to MyChart messages within 1-2 business days.  For prescription refills, please ask your pharmacy to contact our office. Our fax number is [phone].  If you have an urgent issue when the clinic is closed that cannot wait until the next business day, you can page your doctor at the number below.    Please note that while we do our best to be available for urgent issues outside of office hours, we are not available 24/7.   If you have an urgent issue and are unable to reach [place], you may choose to seek medical care at your doctor's office, retail clinic, urgent care center, or emergency room.  If you have a medical emergency, please immediately call 911 or go to the emergency department.  Pager Numbers  - Dr. Nehemiah Massed: [phone]  - Dr. Laurence Ferrari: [phone]  - Dr. Nicole Kindred: [phone]  In the event of inclement weather, please call our main line at [phone] for an update on the status of any delays or closures.  Dermatology Medication Tips: Please keep the boxes that topical medications come in in order to help keep track of the instructions about where and how to use these. Pharmacies typically print the medication instructions only on the boxes and not directly on the medication tubes.   If your medication is too expensive, please contact our office at [phone] option 4 or send [place] a message through [place].   We are unable to tell what your co-pay for medications will be in advance as this is different depending on your insurance coverage. However, we may be able to find a substitute medication at lower cost or fill out paperwork to get insurance to cover a needed medication.   If a prior authorization is required to get your medication  covered by your insurance company, please allow [place] 1-2 business days to complete this process.  Drug prices often vary depending on where the prescription is filled and some pharmacies may offer cheaper prices.  The website www.goodrx.com contains coupons for medications through different pharmacies. The prices here do not account for what the cost may be with help from insurance (it may be cheaper with your insurance), but the website can give you the price if you did not use any insurance.  - You can print the associated coupon and take it with your prescription to the pharmacy.  - You may also stop by our office during regular business hours and pick up a GoodRx coupon card.  - If you need your prescription sent electronically to a different pharmacy, notify our office through [facility] or by phone at [phone]  option 4.     Si Usted Necesita Algo Despus de Su Visita  Tambin puede enviarnos un mensaje a travs de Pharmacist, community. Por lo general respondemos a los mensajes de MyChart en el transcurso de 1 a 2 das hbiles.  Para renovar recetas, por favor pida a su farmacia que se ponga en contacto con nuestra oficina. Harland Dingwall de fax es [place] [phone].  Si tiene un asunto urgente cuando la clnica est cerrada y que no puede esperar hasta el siguiente da hbil, puede llamar/localizar a su doctor(a) al nmero que aparece a continuacin.   Por favor, tenga en cuenta que aunque hacemos todo lo posible para estar disponibles para asuntos urgentes fuera del horario de [place], no estamos disponibles las 24 horas del da, los 7 das de la [place].   Si tiene un problema urgente y no puede comunicarse con nosotros, puede optar por buscar atencin mdica  en el consultorio de su doctor(a), en una clnica privada, en un centro de atencin urgente o en una sala de emergencias.  Si tiene Engineering geologist, por favor llame inmediatamente al 911 o vaya a la sala de  emergencias.  Nmeros de bper  - Dr. Nehemiah Massed: [phone]  - Dra. Moye: [phone]  - Dra. Nicole Kindred: [phone]  En caso de inclemencias del [place], por favor llame a Johnsie Kindred principal al [phone] para una actualizacin sobre el [place] de cualquier retraso o cierre.  Consejos para la medicacin en dermatologa: Por favor, guarde las cajas en las que vienen los medicamentos de uso tpico para ayudarle a seguir las instrucciones sobre dnde y cmo usarlos. Las farmacias generalmente imprimen las instrucciones del medicamento slo en las cajas y no directamente en los tubos del [place].   Si su medicamento es muy caro, por favor, pngase en contacto con Zigmund Daniel llamando al [phone] y presione la opcin 4 o envenos un mensaje a travs de Pharmacist, community.   No podemos decirle cul ser su copago por los medicamentos por adelantado ya que esto es diferente dependiendo de la cobertura de su seguro. Sin embargo, es posible que podamos encontrar un medicamento sustituto a Electrical engineer un formulario para que el seguro cubra el medicamento que se considera necesario.   Si se requiere una autorizacin previa para que su compaa de seguros [place] su medicamento, por favor permtanos de 1 a 2 das hbiles para completar este proceso.  Los precios de los medicamentos varan con frecuencia dependiendo del Environmental consultant de dnde se surte la receta y alguna farmacias pueden ofrecer precios ms baratos.  El sitio web www.goodrx.com tiene cupones para medicamentos de Airline pilot. Los precios aqu no tienen en cuenta lo que podra costar con la ayuda del seguro (puede ser ms barato con su seguro), pero el sitio web puede darle el precio si no utiliz Research scientist (physical sciences).  - Puede imprimir el cupn correspondiente y llevarlo con su receta a la farmacia.  - Tambin puede pasar por nuestra oficina durante el horario de atencin regular y Charity fundraiser una tarjeta de cupones de GoodRx.  - Si  necesita que su receta se enve electrnicamente a una farmacia diferente, informe a nuestra oficina a travs de MyChart de [place] o por telfono llamando al [phone] y presione la opcin 4.

## 2021-09-13 NOTE — Progress Notes (Signed)
Follow-Up Visit   Subjective  [name] is a 88 y.o. male who presents for the following: Skin Cancer ([place] on left cheek. Here for recheck and probable EDC Tx. Bx: 07/25/2021) and Skin Problem (Has wound on left lateral ankle. Has been using Mupirocin on this once a week).  The patient has spots, moles and lesions to be evaluated, some may be new or changing and the patient has concerns that these could be cancer.  Daughter with patient.   The following portions of the chart were reviewed this encounter and updated as appropriate:  Tobacco  Allergies  Meds  Problems  Med Hx  Surg Hx  Fam Hx      Review of Systems: No other skin or systemic complaints except as noted in HPI or Assessment and Plan.   Objective  Well appearing patient in no apparent distress; mood and affect are within normal limits.  A focused examination was performed including face, neck, left leg. Relevant physical exam findings are noted in the Assessment and Plan.  Left lateral malleolus Superficial ulcer within erythematous patch  left malar cheek Pink macule  Left Lower Leg - Anterior Healing ulceration  Left Anterior Neck Stuck-on, waxy, tan-brown papule or plaque --Discussed benign etiology and prognosis.    Assessment & Plan  Pressure injury of left ankle, stage 1 Left lateral malleolus  Discussed need to avoid pressure in this area.  Continue donut pad with vaseline or zinc oxide paste in center on left ankle until healed.   Alternatively could use Blister band-aids or allevyn dressing   Zinc Oxide 40 % PSTE - Left lateral malleolus Apply once daily with band-aid change  Basal cell carcinoma (BCC) of skin of other part of face left malar cheek  Destruction of lesion  Destruction method: electrodesiccation and curettage   Informed consent: discussed and consent obtained   Timeout:  patient name, date of birth, surgical site, and procedure verified Anesthesia: the lesion  was anesthetized in a standard fashion   Anesthetic:  1% lidocaine w/ epinephrine 1-100,000 buffered w/ 8.4% NaHCO3 Curettage performed in three different directions: Yes   Electrodesiccation performed over the curetted area: Yes   Final wound size (cm):  1.3 Hemostasis achieved with:  electrodesiccation Outcome: patient tolerated procedure well with no complications   Post-procedure details: sterile dressing applied and wound care instructions given   Dressing type: petrolatum    ED&C has about an 85% cure rate and leaves a round wound the size of the skin cancer which is healed with ointment and a bandage over a few weeks time. It leaves a round white scar. No additional pathology is done. If the skin cancer were to come back, we would need to do a surgery to remove it.   Mohs involves cutting out right around the spot and then checking under the microscope to be sure the whole skin cancer is out. The cure rate is about 98-99%. It is done at another office outside of [place] ([place], [place], or [place]). Once the Mohs surgeon confirms the skin cancer is out, they will discuss the options to repair or heal the area. You must take it easy for about two weeks after surgery (no lifting over 10-15 lbs, avoid activity to get your heart rate and blood pressure up).   Pt prefers ED&C. Pt voices understanding that if the cancer comes back, it might require a larger surgery or more involved treatment.  Encounter for wound re-check Left Lower Leg -  Anterior  Continue Mupirocin and band-aid to left leg once daily as directed until healed.   Seborrheic keratosis Left Anterior Neck  Reassured benign age-related growth.  Recommend observation.  Discussed cryotherapy if spot(s) become irritated or inflamed.   Return in about 4 months (around 01/13/2022) for [facility] recheck, Ulcer recheck, Left leg recheck.  I, Emelia Salisbury, CMA, am acting as scribe for Forest Gleason, MD.  Documentation: I have  reviewed the above documentation for accuracy and completeness, and I agree with the above.  Forest Gleason, MD

## 2021-09-19 ENCOUNTER — Other Ambulatory Visit: Payer: Medicare Other | Admitting: Nurse Practitioner

## 2021-09-20 ENCOUNTER — Ambulatory Visit: Payer: Medicare Other | Admitting: Family

## 2021-09-23 ENCOUNTER — Encounter: Payer: Self-pay | Admitting: Dermatology

## 2021-10-01 NOTE — Progress Notes (Signed)
Patient ID: JUDDSON COBERN, male    DOB: 1928/04/16, 88 y.o.   MRN: [id number]   Mr Botting is a 88 y/o male with a history of CAD, HTN, previous tobacco use and chronic heart failure.   Echo report from 10/08/19 reviewed and showed an EF of 55-60% along with moderately elevated PA pressure, severe LAE/ RAE and mild MR. Echo report from 01/06/2019 reviewed and showed an EF of 55-60% along with mild/ moderate MR and moderately elevated PA pressure.   Has not been admitted or been in the ED in the last 6 months.   He presents with a chief complaint of moderate shortness of breath with minimal exertion. Describes this as chronic in nature and feels like it's stable. He has associated fatigue, cough, pedal edema and chronic difficulty sleeping. He denies any dizziness, abdominal distention, palpitations, chest pain, wheezing or weight gain.   Continues to wear his oxygen at 2.5-3L around the clock. Doesn't add salt to his food but does eat often. Eats at [place] a few times/ week and gets sausage/egg biscuit, jr bacon cheeseburgers from [place] and a plate at [facility] will last him 3-4 days.   Past Medical History:  Diagnosis Date   (HFpEF) heart failure with preserved ejection fraction (HCC)    Aortic atherosclerosis ([place])    Basal cell carcinoma 09/13/2021   Left cheek. EDC   Cardiac murmur    Grade I/VI medium pitched mid systolic blowing at lower LSB   COPD (chronic obstructive pulmonary disease) (HCC)    Coronary artery disease    Dependence on supplemental oxygen    HLD (hyperlipidemia)    Hypertension    Paroxysmal atrial fibrillation (HCC)    PVD (peripheral vascular disease) (HCC)    Skin cancer    nose and ear - tx with Mohs   Thoracic aortic ectasia (HCC)    a.) measured 3.6 x 3.3 cm on 10/08/2019   Past Surgical History:  Procedure Laterality Date   COLONOSCOPY  2007   ENDARTERECTOMY FEMORAL Right 08/09/2020   Procedure: RIGHT COMMON AND PROFUNDA FEMORAL  ENDARTERECTOMY;  Surgeon: Algernon Huxley, MD;  Location: [facility] ORS;  Service: Vascular;  Laterality: Right;   EYE SURGERY Bilateral 2005   LOWER EXTREMITY ANGIOGRAPHY Right 07/05/2020   Procedure: LOWER EXTREMITY ANGIOGRAPHY;  Surgeon: Algernon Huxley, MD;  Location: [facility];  Service: Cardiovascular;  Laterality: Right;   Renal Bypass Surgery Bilateral 2000   No family history on file. Social History   Tobacco Use   Smoking status: Former    Packs/day: 1.00    Years: 30.00    Total pack years: 30.00    Types: Cigarettes    Quit date: 1997    Years since quitting: 26.6   Smokeless tobacco: Never  Substance Use Topics   Alcohol use: Never   Allergies  Allergen Reactions   Bee Venom Anaphylaxis   Prior to Admission medications   Medication Sig Start Date End Date Taking? Authorizing Provider  Acetaminophen 500 MG capsule Take by mouth.   Yes [provider]  albuterol (PROVENTIL) (2.5 MG/3ML) 0.083% nebulizer solution Take 3 mLs (2.5 mg total) by nebulization every 6 (six) hours as needed for wheezing or shortness of breath. 01/31/19  Yes Danford, Suann Larry, MD  alendronate (FOSAMAX) 70 MG tablet Take 70 mg by mouth once a week. Sunday morning with full glass of water and sit up do not lay down 06/07/20  Yes [provider]  apixaban (ELIQUIS) 2.5 MG TABS tablet Take 2.5 mg by mouth 2 (two) times daily.   Yes [provider]  ascorbic acid (VITAMIN C) 250 MG tablet Take 1 tablet (250 mg total) by mouth 2 (two) times daily. 08/11/20  Yes Stegmayer, Janalyn Harder, PA-C  aspirin EC 81 MG EC tablet Take 1 tablet (81 mg total) by mouth daily at 6 (six) AM. Swallow whole. 08/11/20  Yes Stegmayer, Joelene Millin A, PA-C  atorvastatin (LIPITOR) 20 MG tablet Take 1 tablet (20 mg total) by mouth daily. 08/09/21  Yes Kris Hartmann, NP  Calcium Carb-Cholecalciferol (CALCIUM 600/VITAMIN D PO) Take by mouth.   Yes [provider]  CARTIA XT 300 MG 24 hr capsule  Take 300 mg by mouth daily. 09/16/19  Yes [provider]  ciclopirox (LOPROX) 0.77 % cream Apply topically 2 (two) times daily. Apply to feet BID x 1 month. Then QW thereafter. 07/25/21  Yes Moye, [place], MD  ciclopirox (LOPROX) 0.77 % SUSP Apply once daily to affected fingernails 08/01/21  Yes Moye, [place], MD  Fluticasone-Umeclidin-Vilant (TRELEGY ELLIPTA) 100-62.5-25 MCG/INH AEPB Inhale into the lungs.   Yes [provider]  furosemide (LASIX) 20 MG tablet TAKE 1 TABLET BY MOUTH DAILY Patient taking differently: 20 mg. Two tablets M_W_F and 1 tablet Tues, Thurs, Sat and Sun 09/16/19  Yes Marijane Trower, [name] A, FNP  metoprolol tartrate (LOPRESSOR) 50 MG tablet Take 50 mg by mouth 2 (two) times daily.   Yes [provider]  Multiple Vitamin (MULTIVITAMIN WITH MINERALS) TABS tablet Take 1 tablet by mouth daily. 08/12/20  Yes Stegmayer, Janalyn Harder, PA-C  mupirocin ointment (BACTROBAN) 2 % Apply 1 Application topically daily. Apply to wounds once daily until healed. 07/25/21  Yes Moye, [place], MD  potassium chloride (KLOR-CON) 10 MEQ tablet Take 2 tablets (20 mEq total) by mouth daily. 01/14/20  Yes Andron Marrazzo, [name] A, FNP  predniSONE (DELTASONE) 10 MG tablet Take 5 mg by mouth daily with breakfast.   Yes [provider]  Probiotic Product (PROBIOTIC GUMMIES) 30 MG CHEW Chew by mouth.   Yes [provider]  senna-docusate (SENOKOT-S) 8.6-50 MG tablet Take 1 tablet by mouth daily.   Yes [provider]  Vitamin D, Ergocalciferol, (DRISDOL) 1.25 MG (50000 UNIT) CAPS capsule Take 1 capsule (50,000 Units total) by mouth every 7 (seven) days. 10/15/19  Yes Wouk, Ailene Rud, MD  Wheat Dextrin (BENEFIBER PO) Take by mouth. 2 teaspoons twice a day   Yes [provider]  Zinc Oxide 40 % PSTE Apply once daily with band-aid change 09/13/21  Yes Moye, [place], MD  oxyCODONE-acetaminophen (PERCOCET/ROXICET) 5-325 MG tablet Take 1 tablet by mouth every 8 (eight)  hours as needed for moderate pain. Patient not taking: Reported on 04/09/2021 08/11/20   Stegmayer, Janalyn Harder, PA-C   Review of Systems  Constitutional:  Positive for fatigue (improving). Negative for appetite change.  HENT:  Negative for congestion, postnasal drip and sore throat.   Eyes: Negative.   Respiratory:  Positive for cough and shortness of breath (stable). Negative for wheezing.   Cardiovascular:  Positive for leg swelling. Negative for chest pain and palpitations.  Gastrointestinal:  Negative for abdominal distention and abdominal pain.  Endocrine: Negative.   Genitourinary: Negative.   Musculoskeletal:  Negative for arthralgias and back pain.  Skin: Negative.   Allergic/Immunologic: Negative.   Neurological:  Negative for dizziness and light-headedness.  Hematological:  Negative for adenopathy. Does not bruise/bleed easily.  Psychiatric/Behavioral:  Positive for sleep disturbance (  sleeping on 2 pillows). Negative for dysphoric mood. The patient is not nervous/anxious.    Vitals:   10/02/21 1122  BP: 119/81  Pulse: (!) 49  Resp: 20  SpO2: 98%  Weight: 146 lb 2 oz (66.3 kg)  Height: 6' (1.829 m)   Wt Readings from Last 3 Encounters:  10/02/21 146 lb 2 oz (66.3 kg)  09/05/21 144 lb (65.3 kg)  07/11/21 144 lb (65.3 kg)   Lab Results  Component Value Date   CREATININE 1.21 03/20/2021   CREATININE 1.13 03/16/2021   CREATININE 0.94 08/11/2020   Physical Exam Vitals and nursing note reviewed. Exam conducted with a chaperone present (daughter & paramedic).  Constitutional:      Appearance: He is well-developed.  HENT:     Head: Normocephalic and atraumatic.  Neck:     Vascular: No JVD.  Cardiovascular:     Rate and Rhythm: Regular rhythm. Bradycardia present.  Pulmonary:     Effort: Pulmonary effort is normal. No respiratory distress.     Breath sounds: No wheezing or rales.  Abdominal:     Palpations: Abdomen is soft.     Tenderness: There is no abdominal  tenderness.  Musculoskeletal:        General: No tenderness.     Cervical back: Normal range of motion and neck supple.     Right lower leg: No tenderness. Edema (1+ pitting) present.     Left lower leg: No tenderness. Edema (1+ pitting) present.  Skin:    General: Skin is warm and dry.     Findings: Ecchymosis present.  Neurological:     General: No focal deficit present.     Mental Status: He is alert and oriented to person, place, and time.  Psychiatric:        Mood and Affect: Mood normal.        Behavior: Behavior normal.    Assessment & Plan:  1: Chronic heart failure with preserved ejection fraction- - NYHA class III - euvolemic today  - weighing daily; reminded to call for an overnight weight gain of > 2 pounds or a weekly weight gain of > 5 pounds - weight stable from last visit here 4.5 months ago - not adding salt to his food but does eat out often; [place] a times a week, jr Nurse, learning disability from [employer] and plate at [employer] - is aware those foods are high in sodium but does space those foods out over several days - saw cardiology (Fath) 05/15/21 - saw palliative care 07/05/21 - participating in paramedicine program & paramedic was present during clinic visit - BNP 10/08/19 was 1004.8  2: HTN- - BP looks good (119/81) - saw PCP Edwina Barth) 06/19/21 - BMP 06/19/21 reviewed and showed sodium 138, potassium 4.4, creatinine 1.3 and GFR 52  3: COPD/ PAH- - wearing oxygen at 2.5-3L around the clock - saw pulmonology Raul Del) 04/30/21 - using nebulizer 4 times/ day  4: Lymphedema- - stage 2 - does elevate legs when sitting for long periods of time - wearing compression socks with some improvement of edema - limited in exercise due to symptoms - saw vascular Owens Shark) 08/07/21   Medication list was reviewed.  Return in 6 months, sooner if needed.

## 2021-10-02 ENCOUNTER — Encounter: Payer: Self-pay | Admitting: Family

## 2021-10-02 ENCOUNTER — Ambulatory Visit: Payer: Medicare Other | Attending: Family | Admitting: Family

## 2021-10-02 VITALS — BP 119/81 | HR 49 | Resp 20 | Ht 72.0 in | Wt 146.1 lb

## 2021-10-02 DIAGNOSIS — I10 Essential (primary) hypertension: Secondary | ICD-10-CM

## 2021-10-02 DIAGNOSIS — I5032 Chronic diastolic (congestive) heart failure: Secondary | ICD-10-CM | POA: Insufficient documentation

## 2021-10-02 DIAGNOSIS — J449 Chronic obstructive pulmonary disease, unspecified: Secondary | ICD-10-CM | POA: Diagnosis not present

## 2021-10-02 DIAGNOSIS — I110 Hypertensive heart disease with heart failure: Secondary | ICD-10-CM | POA: Diagnosis not present

## 2021-10-02 DIAGNOSIS — Z87891 Personal history of nicotine dependence: Secondary | ICD-10-CM | POA: Insufficient documentation

## 2021-10-02 DIAGNOSIS — I2510 Atherosclerotic heart disease of native coronary artery without angina pectoris: Secondary | ICD-10-CM | POA: Insufficient documentation

## 2021-10-02 DIAGNOSIS — I890 Lymphedema, not elsewhere classified: Secondary | ICD-10-CM

## 2021-10-02 NOTE — Patient Instructions (Signed)
Continue weighing daily and call for an overnight weight gain of 3 pounds or more or a weekly weight gain of more than 5 pounds.   If you have voicemail, please make sure your mailbox is cleaned out so that we may leave a message and please make sure to listen to any voicemails.     

## 2021-10-17 ENCOUNTER — Ambulatory Visit: Payer: Medicare Other | Admitting: Dermatology

## 2021-10-31 ENCOUNTER — Other Ambulatory Visit: Payer: Medicare Other | Admitting: Nurse Practitioner

## 2021-10-31 DIAGNOSIS — I509 Heart failure, unspecified: Secondary | ICD-10-CM

## 2021-10-31 DIAGNOSIS — Z515 Encounter for palliative care: Secondary | ICD-10-CM

## 2021-10-31 DIAGNOSIS — J449 Chronic obstructive pulmonary disease, unspecified: Secondary | ICD-10-CM

## 2021-10-31 DIAGNOSIS — R0602 Shortness of breath: Secondary | ICD-10-CM

## 2021-10-31 NOTE — Progress Notes (Signed)
[facility] Palliative Care Consult Note Telephone: [phone]  Fax: [phone]    Date of encounter: 10/31/21 2:53 PM PATIENT NAME: [name] [id number]   [phone] (home)  DOB: 29-Aug-1928 MRN: [id number] PRIMARY CARE PROVIDER:    Baxter Hire, MD,  [place] [address] [phone]  REFERRING PROVIDER:   Baxter Hire, MD [place],  [place] [address] [phone]  RESPONSIBLE PARTY:    Contact Information     Name Relation Home Work Mobile   Christopher, Good Daughter   [phone]        I met face to face with patient  in home. Palliative Care was asked to follow this patient by consultation request of  Christopher Hire, MD to address advance care planning and complex medical decision making. This is a follow up visit.                                   ASSESSMENT AND PLAN / RECOMMENDATIONS:    Symptom Management/Plan: ACP: DNR  2. CHF: stable. Discussed patient with community paramedic program. Continue to monitor daily weights and edema  3.COPD: stable. Continue to wear continuous oxygen at 3L/min via nasal cannula, inhalation therapy, and pulmonary toileting. Monitor respiratory status  4. Palliative Care Encounter: Palliative medicine team will continue to support patient, patient's family, and medical team. Visit consisted of counseling and education dealing with the complex and emotionally intense issues of symptom management and palliative care in the setting of serious and potentially life-threatening illness  Follow up Palliative Care Visit: Palliative care will continue to follow for complex medical decision making, advance care planning, and clarification of goals.   I spent 61 minutes providing this consultation. More than 50% of the time in this consultation was spent in counseling and care coordination.  PPS: 50% Chief  Complaint:  Follow up palliative consult for complex medical decision making   HISTORY OF PRESENT ILLNESS:  [name] is a 88 y.o. year old male  with with multiple medical problems including COPD, O2 dependence, Coronary artery disease, atrial fibrillation, chronic diastolic congestive heart failure, hypertension, hyperlipidemia, history of coronary artery bypass surgery. I called [name], Mr. Granieri daughter to confirm PC visit and covid screening negative. [name] was in agreement. Visit was conducted in patient's home. Patient states that he is doing well. He denies pain. He reports that he sleeps okay, some nights are better than others. He also states that he naps throughout the day. Completed ros, medical goals discussed. Discussion was had about sleep hygiene. He has not  had a change in oxygen requirements, remains at 3L/min via nasal cannula. He reports that appetite has been adequate and weight ranges from 143-146 pounds. Patient reports eating fast food multiple times a week. Discussion was had with about salt intake and relation of salt intake to fluid retention. Patient denies GI or GU issues. Patient uses cane intermittently to assist with ambulation. No recent falls reported. Patient reports having a procedure to remove basal cell carcinoma on lateral left lower leg. Reports area on leg has since healed. Patient reports no recent infections or hospitalizations. Mr. Sahagian appears stable. Therapeutic listening and emotional support provided. Questions from patient answered. PC RN/PC SW to continue to follow 8 weeks prn as needed with PC NP to sign off due to program  changes and currently [name] is stable.  History obtained from review of EMR, discussion with Mr. Authement. I reviewed available labs, medications, imaging, studies and related documents from the EMR.  Records reviewed and summarized above.   ROS Full 10 system review of systems performed and negative with  exception of: as per HPI.   Physical Exam: Most recent weight: 146lbs (10/02/2021) Constitutional: NAD General: frail appearing, pleasant male EYES: lids intact, no discharge  ENMT:  oral mucous membranes moist CV: S1S2, RRR, no LE edema Pulmonary: Left lower lobe fine crackles present, no increased work of breathing, no cough, O2 @3L /min Abdomen: normo-active BS + 4 quadrants, soft and non tender, no ascites GU: deferred MSK: moves all extremities, ambulatory with cane Skin: warm and dry, no rashes or wounds on visible skin Neuro: generalized weakness,  no cognitive impairment Psych: non-anxious affect, A and O x 3  Questions and concerns were addressed. The patient/family was encouraged to call with questions and/or concerns. Provided general support and encouragement, no other unmet needs identified   Thank you for the opportunity to participate in the care of Mr. Mathey.  The palliative care team will continue to follow. Please call our office at [phone] if we can be of additional assistance.   Christopher Diperna Z Jarquez Mestre, NP   COVID-19 PATIENT SCREENING TOOL Asked and negative response unless otherwise noted:   Have you had symptoms of covid, tested positive or been in contact with someone with symptoms/positive test in the past 5-10 days?

## 2021-11-07 ENCOUNTER — Other Ambulatory Visit (INDEPENDENT_AMBULATORY_CARE_PROVIDER_SITE_OTHER): Payer: Self-pay | Admitting: Nurse Practitioner

## 2021-11-07 DIAGNOSIS — I739 Peripheral vascular disease, unspecified: Secondary | ICD-10-CM

## 2021-11-08 ENCOUNTER — Ambulatory Visit (INDEPENDENT_AMBULATORY_CARE_PROVIDER_SITE_OTHER): Payer: Medicare Other | Admitting: Nurse Practitioner

## 2021-11-08 ENCOUNTER — Encounter (INDEPENDENT_AMBULATORY_CARE_PROVIDER_SITE_OTHER): Payer: Self-pay | Admitting: Nurse Practitioner

## 2021-11-08 ENCOUNTER — Ambulatory Visit (INDEPENDENT_AMBULATORY_CARE_PROVIDER_SITE_OTHER): Payer: Medicare Other

## 2021-11-08 VITALS — BP 142/55 | HR 52 | Resp 18

## 2021-11-08 DIAGNOSIS — Z9889 Other specified postprocedural states: Secondary | ICD-10-CM | POA: Diagnosis not present

## 2021-11-08 DIAGNOSIS — I10 Essential (primary) hypertension: Secondary | ICD-10-CM

## 2021-11-08 DIAGNOSIS — I739 Peripheral vascular disease, unspecified: Secondary | ICD-10-CM

## 2021-11-08 DIAGNOSIS — E785 Hyperlipidemia, unspecified: Secondary | ICD-10-CM

## 2021-11-11 ENCOUNTER — Encounter (INDEPENDENT_AMBULATORY_CARE_PROVIDER_SITE_OTHER): Payer: Self-pay | Admitting: Nurse Practitioner

## 2021-11-11 NOTE — Progress Notes (Signed)
Subjective:    Patient ID: [name], male    DOB: Jun 15, 1928, 88 y.o.   MRN: [id number] No chief complaint on file.   The patient returns today for noninvasive studies related to his lower extremity perfusion.  The patient does note some discomfort in his lower extremities but no extensive rest pain or open ulcerations.  Today the patient's noninvasive studies appear improved however it is noted that the patient has several occlusions that were not amenable to intervention.  Overall he is improved.  Today the patient has noncompressible ABIs bilaterally but with a TBI of 1.06 on the right and 0.90 on the left.  This is improved from his previous study on 08/07/2021.  He continues to have monophasic tibial artery waveforms bilaterally but with near normal toe waveforms bilaterally.    Review of Systems  Neurological:  Positive for weakness.  All other systems reviewed and are negative.      Objective:   Physical Exam Vitals reviewed.  HENT:     Head: Normocephalic.  Cardiovascular:     Rate and Rhythm: Normal rate.  Pulmonary:     Effort: Pulmonary effort is normal.  Skin:    General: Skin is warm and dry.  Neurological:     Mental Status: He is alert and oriented to person, place, and time.  Psychiatric:        Mood and Affect: Mood normal.        Behavior: Behavior normal.        Thought Content: Thought content normal.        Judgment: Judgment normal.     BP (!) 142/55 (BP Location: Right Arm)   Pulse (!) 52   Resp 18   Past Medical History:  Diagnosis Date   (HFpEF) heart failure with preserved ejection fraction (HCC)    Aortic atherosclerosis ([place])    Basal cell carcinoma 09/13/2021   Left cheek. EDC   Cardiac murmur    Grade I/VI medium pitched mid systolic blowing at lower LSB   COPD (chronic obstructive pulmonary disease) (HCC)    Coronary artery disease    Dependence on supplemental oxygen    HLD (hyperlipidemia)    Hypertension    Paroxysmal  atrial fibrillation (HCC)    PVD (peripheral vascular disease) (HCC)    Skin cancer    nose and ear - tx with Mohs   Thoracic aortic ectasia (HCC)    a.) measured 3.6 x 3.3 cm on 10/08/2019    Social History   Socioeconomic History   Marital status: Married    Spouse name: Not on file   Number of children: Not on file   Years of education: Not on file   Highest education level: Not on file  Occupational History   Not on file  Tobacco Use   Smoking status: Former    Packs/day: 1.00    Years: 30.00    Total pack years: 30.00    Types: Cigarettes    Quit date: 34    Years since quitting: 26.8   Smokeless tobacco: Never  Vaping Use   Vaping Use: Never used  Substance and Sexual Activity   Alcohol use: Never   Drug use: Never   Sexual activity: Not Currently  Other Topics Concern   Not on file  Social History Narrative   Lives at home alone with help from daughter.   Social Determinants of Health   Financial Resource Strain: Not on file  Food Insecurity: Not  on file  Transportation Needs: Not on file  Physical Activity: Not on file  Stress: Not on file  Social Connections: Not on file  Intimate Partner Violence: Not on file    Past Surgical History:  Procedure Laterality Date   COLONOSCOPY  2007   ENDARTERECTOMY FEMORAL Right 08/09/2020   Procedure: RIGHT COMMON AND PROFUNDA FEMORAL ENDARTERECTOMY;  Surgeon: Algernon Huxley, MD;  Location: [facility];  Service: Vascular;  Laterality: Right;   EYE SURGERY Bilateral 2005   LOWER EXTREMITY ANGIOGRAPHY Right 07/05/2020   Procedure: LOWER EXTREMITY ANGIOGRAPHY;  Surgeon: Algernon Huxley, MD;  Location: [facility];  Service: Cardiovascular;  Laterality: Right;   Renal Bypass Surgery Bilateral 2000    History reviewed. No pertinent family history.  Allergies  Allergen Reactions   Bee Venom Anaphylaxis       Latest Ref Rng & Units 08/11/2020    5:27 AM 08/10/2020    1:05 AM 08/09/2020   12:40 PM  CBC  WBC  4.0 - 10.5 K/uL 9.7  9.3  6.9   Hemoglobin 13.0 - 17.0 g/dL 9.6  9.2  9.6   Hematocrit 39.0 - 52.0 % 30.3  28.2  29.5   Platelets 150 - 400 K/uL 290  230  237       CMP     Component Value Date/Time   NA 135 03/20/2021 1222   NA 140 02/02/2014 0356   K 4.2 03/20/2021 1222   K 3.7 02/02/2014 0356   CL 93 (L) 03/20/2021 1222   CL 104 02/02/2014 0356   CO2 33 (H) 03/20/2021 1222   CO2 29 02/02/2014 0356   GLUCOSE 94 03/20/2021 1222   GLUCOSE 160 (H) 02/02/2014 0356   BUN 36 (H) 03/20/2021 1222   BUN 33 (H) 02/02/2014 0356   CREATININE 1.21 03/20/2021 1222   CREATININE 0.86 02/02/2014 0356   CALCIUM 10.2 03/20/2021 1222   CALCIUM 8.6 10/10/2019 0305   PROT 7.6 10/08/2019 1150   PROT 7.5 01/29/2014 0949   ALBUMIN 4.0 10/08/2019 1150   ALBUMIN 3.1 (L) 01/29/2014 0949   AST 19 10/08/2019 1150   AST 39 (H) 01/29/2014 0949   ALT 14 10/08/2019 1150   ALT 27 01/29/2014 0949   ALKPHOS 66 10/08/2019 1150   ALKPHOS 79 01/29/2014 0949   BILITOT 1.1 10/08/2019 1150   BILITOT 0.7 01/29/2014 0949   GFRNONAA 57 (L) 03/20/2021 1222   GFRNONAA >60 02/02/2014 0356   GFRNONAA >60 06/25/2011 1325   GFRAA >60 10/15/2019 0634   GFRAA >60 02/02/2014 0356   GFRAA >60 06/25/2011 1325     No results found.     Assessment & Plan:   1. Peripheral arterial disease with history of revascularization ([place]) Currently the patient has no open wounds or ulcerations and he has no significant claudication pain or rest pain.  The patient's vessels as evidenced by studies today are heavily calcific and have multiple areas of occlusion.  Following review of the angiogram and discussion with Dr. Lucky Cowboy this is consistent with the previous angiogram that the patient had on 08/09/2020.  The patient does have notable significant collaterals likely helping his continued perfusion.  Due to the lack of any symptoms as well as no limb threatening symptoms we will continue to watch the patient closely in 3-month follow-up to ensure there are no developing symptoms.  The patient and family are advised to contact [place] he begins to have open wounds or ulceration or begins to  experience rest pain like symptoms. - VAS [place] ABI WITH/WO TBI  2. Hyperlipidemia, unspecified hyperlipidemia type Continue statin as ordered and reviewed, no changes at this time   3. Primary hypertension Continue antihypertensive medications as already ordered, these medications have been reviewed and there are no changes at this time.    Current Outpatient Medications on File Prior to Visit  Medication Sig Dispense Refill   Acetaminophen 500 MG capsule Take by mouth.     albuterol (PROVENTIL) (2.5 MG/3ML) 0.083% nebulizer solution Take 3 mLs (2.5 mg total) by nebulization every 6 (six) hours as needed for wheezing or shortness of breath. 75 mL 0   alendronate (FOSAMAX) 70 MG tablet Take 70 mg by mouth once a week. Sunday morning with full glass of water and sit up do not lay down     apixaban (ELIQUIS) 2.5 MG TABS tablet Take 2.5 mg by mouth 2 (two) times daily.     ascorbic acid (VITAMIN C) 250 MG tablet Take 1 tablet (250 mg total) by mouth 2 (two) times daily. 60 tablet 6   aspirin EC 81 MG EC tablet Take 1 tablet (81 mg total) by mouth daily at 6 (six) AM. Swallow whole. 90 tablet 3   atorvastatin (LIPITOR) 20 MG tablet Take 1 tablet (20 mg total) by mouth daily. 90 tablet 3   Calcium Carb-Cholecalciferol (CALCIUM 600/VITAMIN D PO) Take by mouth.     CARTIA XT 300 MG 24 hr capsule Take 300 mg by mouth daily.     ciclopirox (LOPROX) 0.77 % cream Apply topically 2 (two) times daily. Apply to feet BID x 1 month. Then QW thereafter. 90 g 3   ciclopirox (LOPROX) 0.77 % SUSP Apply once daily to affected fingernails 30 mL 5   Fluticasone-Umeclidin-Vilant (TRELEGY ELLIPTA) 100-62.5-25 MCG/INH AEPB Inhale into the lungs.     furosemide (LASIX) 20 MG tablet TAKE 1 TABLET BY MOUTH DAILY (Patient taking differently: 20 mg. Two tablets  M_W_F and 1 tablet Tues, Thurs, Sat and Sun) 30 tablet 5   metoprolol tartrate (LOPRESSOR) 50 MG tablet Take 50 mg by mouth 2 (two) times daily.     Multiple Vitamin (MULTIVITAMIN WITH MINERALS) TABS tablet Take 1 tablet by mouth daily. 90 tablet 3   mupirocin ointment (BACTROBAN) 2 % Apply 1 Application topically daily. Apply to wounds once daily until healed. 22 g 2   potassium chloride (KLOR-CON) 10 MEQ tablet Take 2 tablets (20 mEq total) by mouth daily. 60 tablet 5   predniSONE (DELTASONE) 10 MG tablet Take 5 mg by mouth daily with breakfast.     Probiotic Product (PROBIOTIC GUMMIES) 30 MG CHEW Chew by mouth.     senna-docusate (SENOKOT-S) 8.6-50 MG tablet Take 1 tablet by mouth daily.     Vitamin D, Ergocalciferol, (DRISDOL) 1.25 MG (50000 UNIT) CAPS capsule Take 1 capsule (50,000 Units total) by mouth every 7 (seven) days. 5 capsule 0   Wheat Dextrin (BENEFIBER PO) Take by mouth. 2 teaspoons twice a day     Zinc Oxide 40 % PSTE Apply once daily with band-aid change 28 g 2   No current facility-administered medications on file prior to visit.    There are no Patient Instructions on file for this visit. No follow-ups on file.   Kris Hartmann, NP

## 2021-11-15 ENCOUNTER — Other Ambulatory Visit (HOSPITAL_COMMUNITY): Payer: Self-pay

## 2021-11-16 ENCOUNTER — Encounter (HOSPITAL_COMMUNITY): Payer: Self-pay

## 2021-11-16 NOTE — Progress Notes (Signed)
Today had a home visit with [name] and his daughter.  He states doing ok but been a little shorter of breath.  He had gained about 3 lbs but yesterday was a double lasix and he had went to bathroom had several bowel movements.  It sounds like he is still having bouts of constipation.  He is going to up up his medication to see if helps.  He has little edema in lower legs.  He did lose back 2 lbs but still 1 lbs up.  Shortness breath is better but still not back to his normal.  He has bumped his oxygen up to 3 where he had at 2.5 liter lately.  Lungs are clear and denies chest pain.  He does eat higher sodium foods and he said he might have ate too much.  Consulted with [name] at HF clinic and she advised for today to take another double of his lasix.  They will call with weight tomorrow.  He has everything he needs for daily living.  He has all his medications and aware of how to take them.  His daughter checks behind him to make sure he is taking them.  Will continue to visit for heart failure, diet and medication management.  [name] [phone]

## 2021-11-19 ENCOUNTER — Other Ambulatory Visit: Payer: Self-pay | Admitting: Family

## 2021-11-19 MED ORDER — METOLAZONE 2.5 MG PO TABS: 2.5000 mg | ORAL_TABLET | Freq: Every day | ORAL | 0 refills | Status: DC | PRN

## 2021-11-19 NOTE — Progress Notes (Signed)
Will send in metolazone 2.'5mg'$  to take today and tomorrow (if needed) due to continued weight gain & SOB. He will double his potassium on these 2 days. He sees cardiology in 2 days and they can check labs. May need to change diuretic to torsemide if furosemide is not longer working.

## 2021-11-22 ENCOUNTER — Encounter (HOSPITAL_COMMUNITY): Payer: Self-pay

## 2021-11-22 NOTE — Progress Notes (Signed)
This week Mr Archey has still been having weight increase with shortness of breath.  He did metolazone for 2 days with no loss.  He seen cardiology Dr Ubaldo Glassing yesterday and they advised him to take 1 more today if was not back down to 145 lbs.  Today he has lost 2 lbs to 146 lbs.  He took a 3rd one today, he feels like breathing is getting a little better.  His daughter is reporting this to [place] office today.  Will continue to visit for heart failure, diet and medication management.  He is also watching his diet and fluids.  He does not like to drink water but tea.  He will add water to his diet.   [place] [phone]

## 2021-11-26 ENCOUNTER — Encounter (INDEPENDENT_AMBULATORY_CARE_PROVIDER_SITE_OTHER): Payer: Self-pay

## 2021-12-27 ENCOUNTER — Telehealth (HOSPITAL_COMMUNITY): Payer: Self-pay

## 2021-12-27 NOTE — Telephone Encounter (Signed)
[name] daughter has advised that his weight has gone up to 145.8 and some shortness of breath.  On Tuesday he was up to 146 lbs but he took an extra furosemide and the weight came down a little.  With his double furosemide yesterday his weight has stayed at 145.8 lbs and still has shortness of breath.  Contacted Tina with HF clinic for advice and she advised for him to take 1 metolazone today with 2 extra potassium, elevate legs, rest and watch his sodium in foods and fluids today.  Scheduled him an appt with HF clinic in December and if weight is no better or worse in the morning advised her to call the HF clinic in the morning.  She repeated it back to me and will have her Dad do accordingly.   [name] [phone]

## 2022-01-05 IMAGING — MR MR THORACIC SPINE W/O CM
5 of 6 series · 29 of 48 positions shown · non-contrast
Comparison: Prior CT from 10/08/2019.

CLINICAL DATA: Initial evaluation for compression fractures.

EXAM:
MRI THORACIC AND LUMBAR SPINE WITHOUT CONTRAST
TECHNIQUE: Multiplanar and multiecho pulse sequences of the thoracic and lumbar
spine were obtained without intravenous contrast.

[Series 18: T1 · sagittal · 6.0mm · 1.88mm/px · 4 of 9 slices shown (1 of 2)]
[im 1/9]
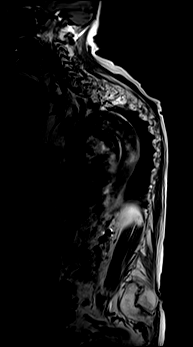
[im 3/9]
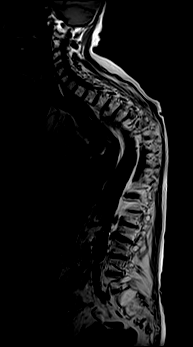
[im 6/9]
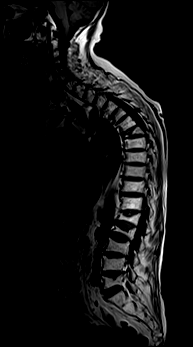
[im 9/9]
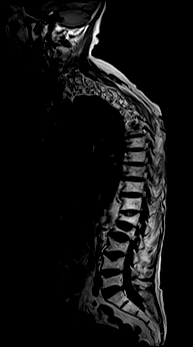

[Series 20: T1 · sagittal · 3.0mm · 1.06mm/px · 6 of 21 slices shown (2 of 2)]
[im 1/21]
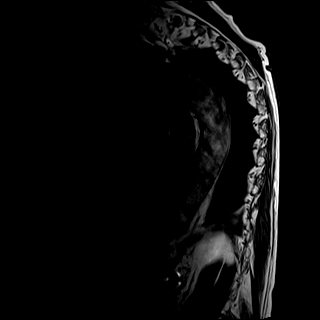
[im 5/21]
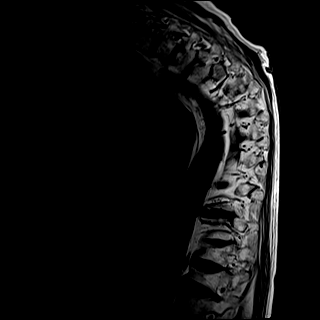
[im 9/21]
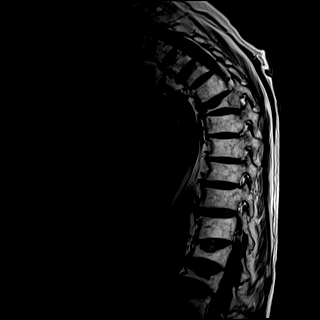
[im 13/21]
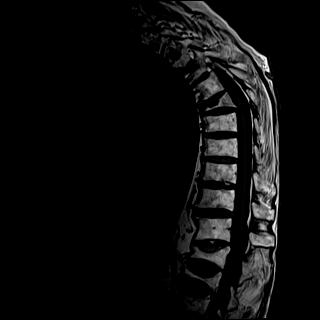
[im 17/21]
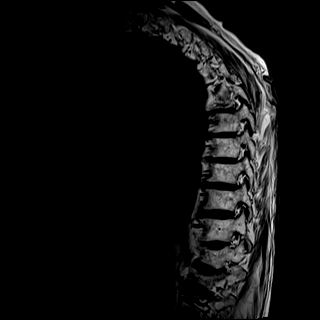
[im 21/21]
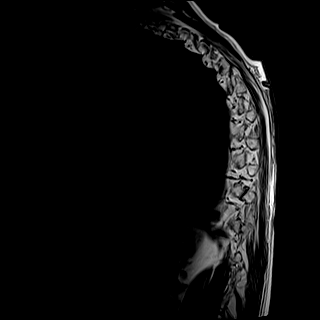

[Series 21: STIR · sagittal · 3.0mm · 0.53mm/px · 4 of 19 slices shown]
[im 1/19]
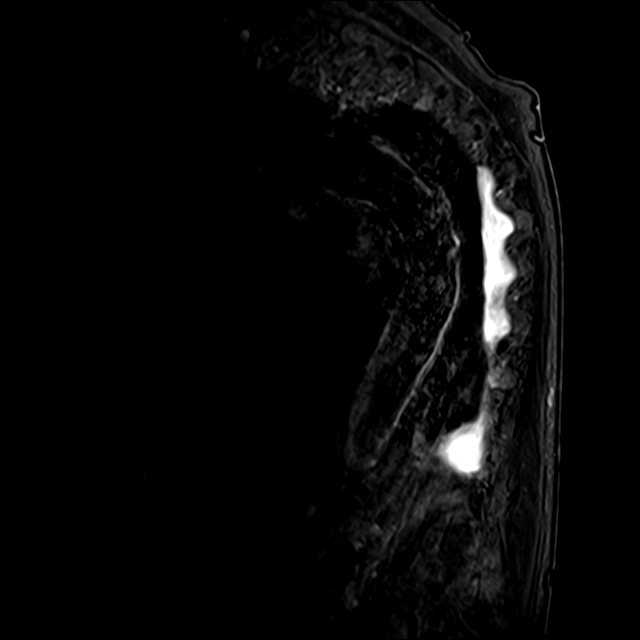
[im 4/19]
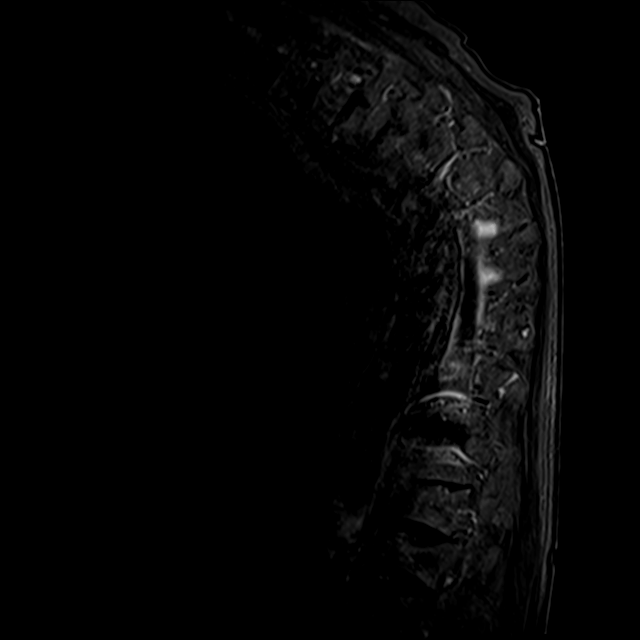
[im 8/19]
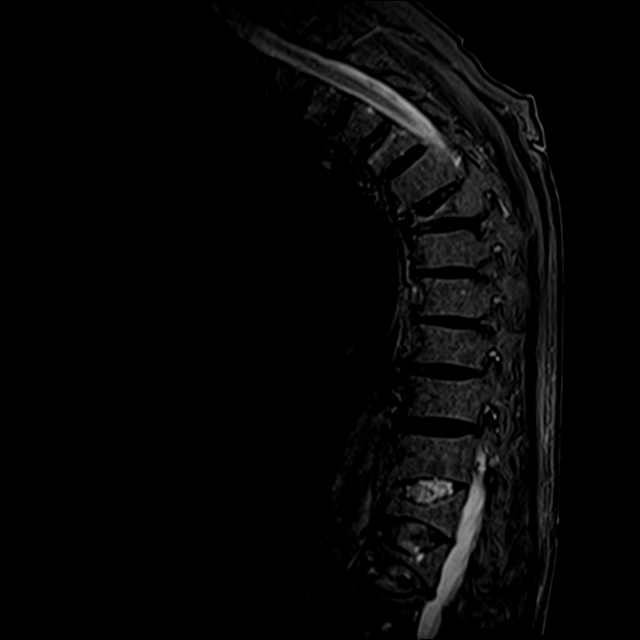
[im 11/19]
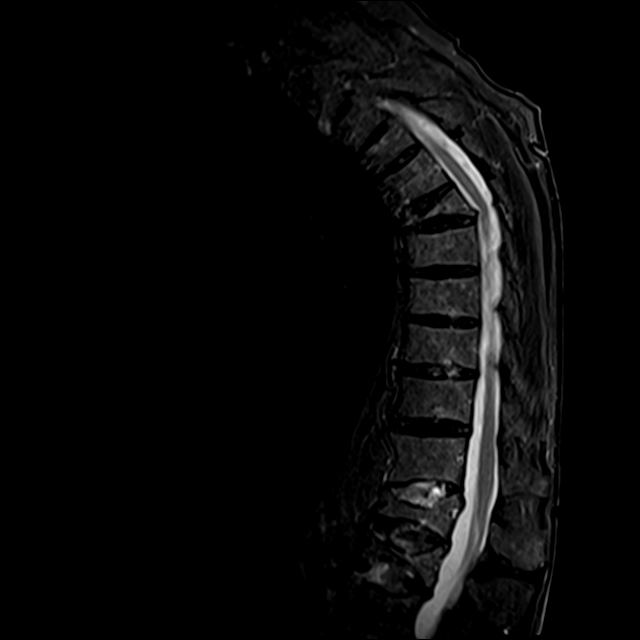

[Series 22: T2 · sagittal · 3.0mm · 1.06mm/px · 6 of 21 slices shown (1 of 2)]
[im 1/21]
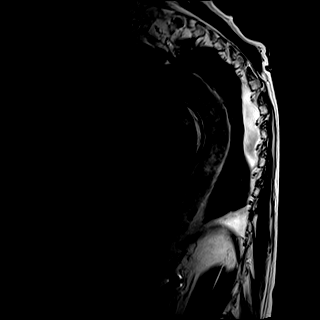
[im 5/21]
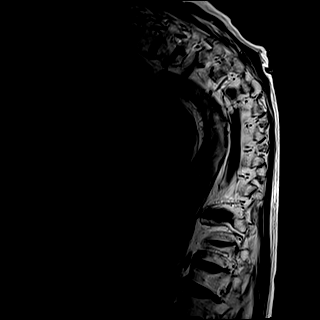
[im 9/21]
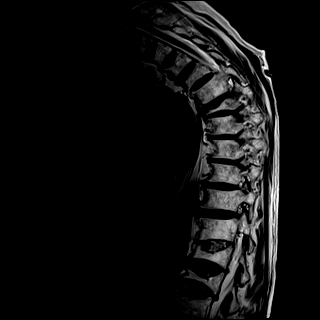
[im 13/21]
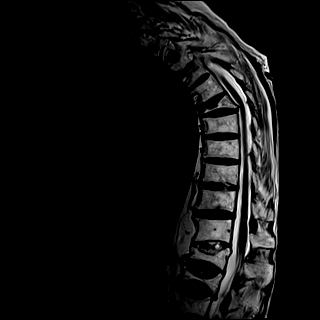
[im 17/21]
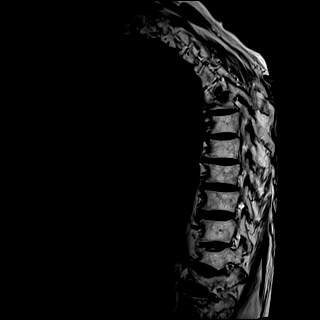
[im 21/21]
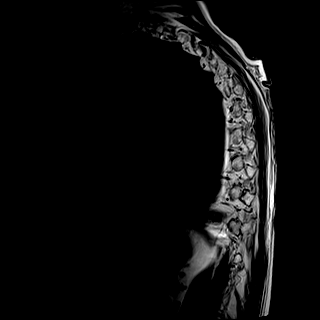

[Series 24: T2 · axial · 4.0mm · 0.59mm/px · z∈[-336,-158]mm · 9 of 43 slices shown (2 of 2)]
[im 1/43]
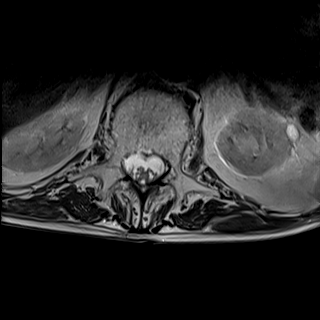
[im 8/43]
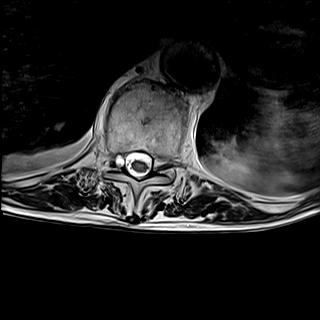
[im 15/43]
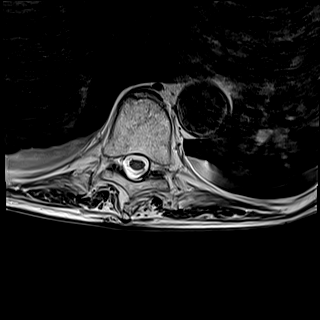
[im 18/43]
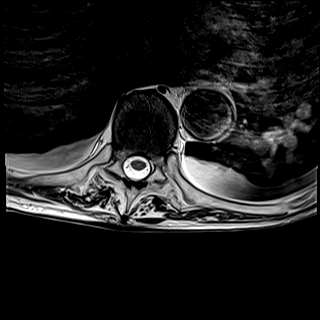
[im 22/43]
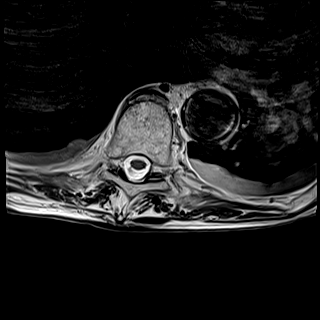
[im 25/43]
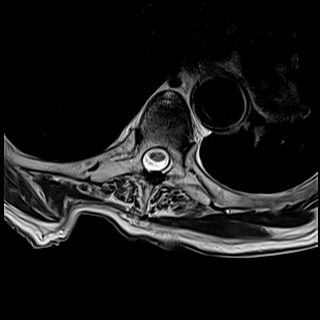
[im 29/43]
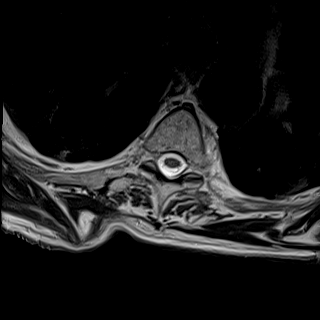
[im 36/43]
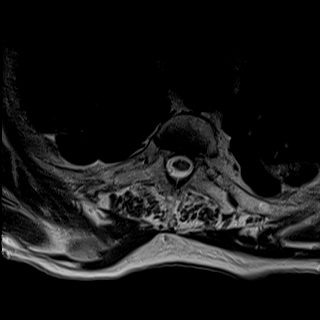
[im 43/43]
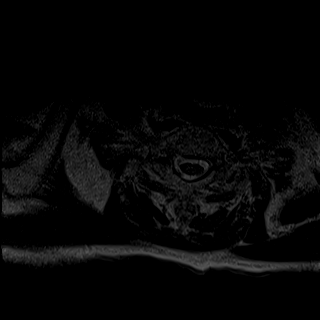

[29 of 48 positions shown; findings below may reference images not displayed]

FINDINGS: MRI THORACIC SPINE FINDINGS

Alignment: Exaggeration of the normal thoracic kyphosis with focal
kyphotic angulation about a T7 compression fracture. Underlying
trace scoliosis. No listhesis.

Vertebrae: Compression deformities about the superior endplates of
T3 and T4 with mild 20% height loss without significant bony
retropulsion. Trace residual marrow edema seen about these
fractures, suggesting that these are late subacute to chronic in
nature. Additional compression fracture of T5 with up to 35% height
loss without significant bony retropulsion. Residual linear marrow
edema consistent with an acute to subacute fracture (series 21,
image 12). Severe compression deformity with near complete anterior
height loss of T7 with associated 3 mm bony retropulsion also
demonstrates some residual marrow edema, suggesting an acute to
subacute component.

Otherwise, vertebral body height fairly well maintained, with no
other acute or chronic compression fracture. Underlying bone marrow
signal intensity within normal limits. No worrisome osseous lesions.
No other abnormal marrow edema.

Cord:  Normal signal and morphology.

Paraspinal and other soft tissues: Paraspinous soft tissues
demonstrate no acute finding. Layering bilateral pleural effusions.
Irregular emphysematous changes partially visualize, better
evaluated on prior chest CT. Few scattered simple cysts noted about
the visualized kidneys. Aortic atherosclerosis noted.

Disc levels:

T7-8: 3 mm bony retropulsion related to the T7 compression fracture.
Secondary flattening of the ventral thecal sac with mild flattening
of the ventral spinal cord, but no more than mild spinal stenosis.

Otherwise, normal expected for age degenerative disc disease
elsewhere within the thoracic spine. Mild scattered multilevel facet
hypertrophy. No other significant canal or foraminal stenosis.

MRI LUMBAR SPINE FINDINGS

Segmentation:  Standard.

Alignment: Trace anterolisthesis of L1 on L2, with trace
retrolisthesis of L2 on L3. Trace anterolisthesis of L4 on L5.
Findings chronic and facet mediated.

Vertebrae: Compression fracture of L1 with up to 60% height loss and
3 mm bony retropulsion. Compression fracture of L2 with severe 80%
height loss and 3 mm bony retropulsion. Biconcave compression
fracture of L3 with mild 25% central height loss without bony
retropulsion. Compression fracture of the superior endplate of L4
with mild 35% height loss without bony retropulsion. Compression
fracture of the superior endplate of L5 with associated 35% height
loss with trace 2 mm bony retropulsion. These fractures are chronic
in appearance, with no residual marrow edema.

Underlying bone marrow signal intensity within normal limits. No
worrisome osseous lesions.

Conus medullaris and cauda equina: Conus extends to the L1 level.
Conus and cauda equina appear normal.

Paraspinal and other soft tissues: Paraspinous soft tissues
demonstrate no acute finding. Few scattered cysts noted about the
kidneys bilaterally. Aortic atherosclerosis.

Disc levels:

L1-2: Diffuse disc bulge with disc desiccation, with superimposed 3
mm bony retropulsion related to the L2 compression fracture. Mild
facet hypertrophy. No significant spinal stenosis. Mild bilateral L1
foraminal narrowing. No impingement.

L2-3: Mild diffuse disc bulge with disc desiccation. Mild facet
hypertrophy. No significant spinal stenosis. Mild right L2 foraminal
narrowing. No significant left foraminal encroachment. No
impingement.

L3-4: Mild diffuse disc bulge with disc desiccation and
intervertebral disc space narrowing. Mild facet hypertrophy. No
significant spinal stenosis. Foramina remain patent.

L4-5: Mild diffuse disc bulge with disc desiccation and
intervertebral disc space narrowing. Superimposed broad-based
central to left foraminal disc protrusion flattens the left ventral
thecal sac (series 4, image 25). Mild facet hypertrophy. Resultant
mild to moderate left lateral recess stenosis. Central canal remains
patent. Mild left L4 foraminal narrowing. No significant right
foraminal stenosis.

L5-S1: Disc desiccation with minimal disc bulge. Mild facet
hypertrophy. No significant canal or foraminal stenosis.
IMPRESSION: MR THORACIC SPINE IMPRESSION

1. Severe compression deformity of T7 with associated 3 mm bony
retropulsion and mild spinal stenosis. Associated residual marrow
edema suggests an acute to subacute component.
2. Acute to subacute compression fracture of T5 with up to 35%
height loss without bony retropulsion.
3. Mild compression fractures of T3 and T4 with up to 20% height
loss. No more than trace residual marrow edema, suggesting at these
are late subacute to chronic.
4. Layering bilateral pleural effusions, increased from prior CT.

MR LUMBAR SPINE IMPRESSION

1. Multiple compression fractures involving the L1 through L5
vertebral bodies as above, all of which are chronic in nature
without residual marrow edema. No acute fracture within the lumbar
spine.
2. Underlying multilevel lumbar spondylosis, most pronounced at L4-5
were there is resultant mild to moderate left lateral recess
stenosis, descending left L5 nerve root level. No other significant
stenosis or neural impingement.

## 2022-01-11 ENCOUNTER — Encounter: Payer: Self-pay | Admitting: Family

## 2022-01-11 ENCOUNTER — Ambulatory Visit: Payer: Medicare Other | Attending: Family | Admitting: Family

## 2022-01-11 VITALS — BP 117/51 | HR 50 | Resp 16 | Wt 146.4 lb

## 2022-01-11 DIAGNOSIS — I2510 Atherosclerotic heart disease of native coronary artery without angina pectoris: Secondary | ICD-10-CM | POA: Insufficient documentation

## 2022-01-11 DIAGNOSIS — I110 Hypertensive heart disease with heart failure: Secondary | ICD-10-CM | POA: Insufficient documentation

## 2022-01-11 DIAGNOSIS — Z23 Encounter for immunization: Secondary | ICD-10-CM | POA: Diagnosis not present

## 2022-01-11 DIAGNOSIS — Z87891 Personal history of nicotine dependence: Secondary | ICD-10-CM | POA: Diagnosis not present

## 2022-01-11 DIAGNOSIS — I10 Essential (primary) hypertension: Secondary | ICD-10-CM | POA: Diagnosis not present

## 2022-01-11 DIAGNOSIS — I890 Lymphedema, not elsewhere classified: Secondary | ICD-10-CM | POA: Diagnosis not present

## 2022-01-11 DIAGNOSIS — Z515 Encounter for palliative care: Secondary | ICD-10-CM | POA: Insufficient documentation

## 2022-01-11 DIAGNOSIS — J449 Chronic obstructive pulmonary disease, unspecified: Secondary | ICD-10-CM | POA: Diagnosis not present

## 2022-01-11 DIAGNOSIS — I5032 Chronic diastolic (congestive) heart failure: Secondary | ICD-10-CM | POA: Insufficient documentation

## 2022-01-11 MED ORDER — TORSEMIDE 20 MG PO TABS: ORAL_TABLET | ORAL | 3 refills | Status: DC

## 2022-01-11 NOTE — Progress Notes (Signed)
Patient ID: KHOEN GENET, male    DOB: 1928/06/10, 88 y.o.   MRN: [id number]   Mr Wernick is a 88 y/o male with a history of CAD, HTN, previous tobacco use and chronic heart failure.   Echo report from 10/08/19 reviewed and showed an EF of 55-60% along with moderately elevated PA pressure, severe LAE/ RAE and mild MR. Echo report from 01/06/2019 reviewed and showed an EF of 55-60% along with mild/ moderate MR and moderately elevated PA pressure.   Has not been admitted or been in the ED in the last 6 months.   He presents with a chief complaint of moderate shortness of breath with minimal exertion. Describes this as chronic in nature having been present for several years. He has associated fatigue, cough, pedal edema and difficulty sleeping along with this. Weighing is trending up. He denies any dizziness, wheezing, chest pain, palpitations or abdominal distention.   Continues to wear his oxygen at 3L. Doesn't feel like his furosemide works as well as it used to .   Past Medical History:  Diagnosis Date   (HFpEF) heart failure with preserved ejection fraction (HCC)    Aortic atherosclerosis ([place])    Basal cell carcinoma 09/13/2021   Left cheek. EDC   Cardiac murmur    Grade I/VI medium pitched mid systolic blowing at lower LSB   COPD (chronic obstructive pulmonary disease) (HCC)    Coronary artery disease    Dependence on supplemental oxygen    HLD (hyperlipidemia)    Hypertension    Paroxysmal atrial fibrillation (HCC)    PVD (peripheral vascular disease) (HCC)    Skin cancer    nose and ear - tx with Mohs   Thoracic aortic ectasia (HCC)    a.) measured 3.6 x 3.3 cm on 10/08/2019   Past Surgical History:  Procedure Laterality Date   COLONOSCOPY  2007   ENDARTERECTOMY FEMORAL Right 08/09/2020   Procedure: RIGHT COMMON AND PROFUNDA FEMORAL ENDARTERECTOMY;  Surgeon: Algernon Huxley, MD;  Location: [facility];  Service: Vascular;  Laterality: Right;   EYE SURGERY Bilateral 2005    LOWER EXTREMITY ANGIOGRAPHY Right 07/05/2020   Procedure: LOWER EXTREMITY ANGIOGRAPHY;  Surgeon: Algernon Huxley, MD;  Location: [facility];  Service: Cardiovascular;  Laterality: Right;   Renal Bypass Surgery Bilateral 2000   No family history on file. Social History   Tobacco Use   Smoking status: Former    Packs/day: 1.00    Years: 30.00    Total pack years: 30.00    Types: Cigarettes    Quit date: 1997    Years since quitting: 26.9   Smokeless tobacco: Never  Substance Use Topics   Alcohol use: Never   Allergies  Allergen Reactions   Bee Venom Anaphylaxis   Prior to Admission medications   Medication Sig Start Date End Date Taking? Authorizing Provider  Acetaminophen 500 MG capsule Take by mouth.   Yes [provider]  albuterol (PROVENTIL) (2.5 MG/3ML) 0.083% nebulizer solution Take 3 mLs (2.5 mg total) by nebulization every 6 (six) hours as needed for wheezing or shortness of breath. 01/31/19  Yes Danford, Suann Larry, MD  alendronate (FOSAMAX) 70 MG tablet Take 70 mg by mouth once a week. Sunday morning with full glass of water and sit up do not lay down 06/07/20  Yes [provider]  apixaban (ELIQUIS) 2.5 MG TABS tablet Take 2.5 mg by mouth 2 (two) times daily.   Yes [provider]  ascorbic acid (VITAMIN C) 250 MG tablet Take 1 tablet (250 mg total) by mouth 2 (two) times daily. 08/11/20  Yes Stegmayer, Janalyn Harder, PA-C  aspirin EC 81 MG EC tablet Take 1 tablet (81 mg total) by mouth daily at 6 (six) AM. Swallow whole. 08/11/20  Yes Stegmayer, Joelene Millin A, PA-C  atorvastatin (LIPITOR) 20 MG tablet Take 1 tablet (20 mg total) by mouth daily. 08/09/21  Yes Kris Hartmann, NP  Calcium Carb-Cholecalciferol (CALCIUM 600/VITAMIN D PO) Take by mouth.   Yes [provider]  CARTIA XT 300 MG 24 hr capsule Take 300 mg by mouth daily. 09/16/19  Yes [provider]  ciclopirox (LOPROX) 0.77 % cream Apply topically 2 (two) times daily.  Apply to feet BID x 1 month. Then QW thereafter. 07/25/21  Yes Moye, [place], MD  ciclopirox (LOPROX) 0.77 % SUSP Apply once daily to affected fingernails 08/01/21  Yes Moye, [place], MD  Fluticasone-Umeclidin-Vilant (TRELEGY ELLIPTA) 100-62.5-25 MCG/INH AEPB Inhale into the lungs.   Yes [provider]  furosemide (LASIX) 20 MG tablet TAKE 1 TABLET BY MOUTH DAILY Patient taking differently: 20 mg. Two tablets M_W_F and 1 tablet Tues, Thurs, Sat and Sun 09/16/19  Yes Dozier Berkovich A, FNP  metolazone (ZAROXOLYN) 2.5 MG tablet Take 1 tablet (2.5 mg total) by mouth daily as needed. Take 1/2 hour before lasix 11/19/21  Yes Karys Meckley, [name] A, FNP  metoprolol tartrate (LOPRESSOR) 50 MG tablet Take 50 mg by mouth 2 (two) times daily.   Yes [provider]  Multiple Vitamin (MULTIVITAMIN WITH MINERALS) TABS tablet Take 1 tablet by mouth daily. 08/12/20  Yes Stegmayer, Joelene Millin A, PA-C  potassium chloride (KLOR-CON) 10 MEQ tablet Take 2 tablets (20 mEq total) by mouth daily. 01/14/20  Yes Crystalee Ventress, [name] A, FNP  predniSONE (DELTASONE) 10 MG tablet Take 5 mg by mouth daily with breakfast.   Yes [provider]  Probiotic Product (PROBIOTIC GUMMIES) 30 MG CHEW Chew by mouth.   Yes [provider]  senna-docusate (SENOKOT-S) 8.6-50 MG tablet Take 1 tablet by mouth daily.   Yes [provider]  Vitamin D, Ergocalciferol, (DRISDOL) 1.25 MG (50000 UNIT) CAPS capsule Take 1 capsule (50,000 Units total) by mouth every 7 (seven) days. 10/15/19  Yes Wouk, Ailene Rud, MD  Wheat Dextrin (BENEFIBER PO) Take by mouth. 2 teaspoons twice a day   Yes [provider]  mupirocin ointment (BACTROBAN) 2 % Apply 1 Application topically daily. Apply to wounds once daily until healed. Patient not taking: Reported on 11/16/2021 07/25/21   Laurence Ferrari, [place], MD  Zinc Oxide 40 % PSTE Apply once daily with band-aid change Patient not taking: Reported on 11/16/2021 09/13/21   Alfonso Patten, MD     Review of Systems  Constitutional:  Positive for fatigue. Negative for appetite change.  HENT:  Negative for congestion, postnasal drip and sore throat.   Eyes: Negative.   Respiratory:  Positive for cough and shortness of breath (stable). Negative for wheezing.   Cardiovascular:  Positive for leg swelling. Negative for chest pain and palpitations.  Gastrointestinal:  Negative for abdominal distention and abdominal pain.  Endocrine: Negative.   Genitourinary: Negative.   Musculoskeletal:  Negative for arthralgias and back pain.  Skin: Negative.   Allergic/Immunologic: Negative.   Neurological:  Negative for dizziness and light-headedness.  Hematological:  Negative for adenopathy. Does not bruise/bleed easily.  Psychiatric/Behavioral:  Positive for sleep disturbance (sleeping on 2 pillows). Negative for dysphoric mood. The patient is not nervous/anxious.  Vitals:   01/11/22 1332  BP: (!) 117/51  Pulse: (!) 50  Resp: 16  SpO2: 91%  Weight: 146 lb 6 oz (66.4 kg)   Wt Readings from Last 3 Encounters:  01/11/22 146 lb 6 oz (66.4 kg)  11/15/21 145 lb (65.8 kg)  10/02/21 146 lb 2 oz (66.3 kg)   Lab Results  Component Value Date   CREATININE 1.21 03/20/2021   CREATININE 1.13 03/16/2021   CREATININE 0.94 08/11/2020   Physical Exam Vitals and nursing note reviewed. Exam conducted with a chaperone present (daughter).  Constitutional:      Appearance: He is well-developed.  HENT:     Head: Normocephalic and atraumatic.  Neck:     Vascular: No JVD.  Cardiovascular:     Rate and Rhythm: Regular rhythm. Bradycardia present.  Pulmonary:     Effort: Pulmonary effort is normal. No respiratory distress.     Breath sounds: No wheezing or rales.  Abdominal:     Palpations: Abdomen is soft.     Tenderness: There is no abdominal tenderness.  Musculoskeletal:        General: No tenderness.     Cervical back: Normal range of motion and neck supple.     Right lower leg: No  tenderness. Edema (1+ pitting) present.     Left lower leg: No tenderness. Edema (1+ pitting) present.  Skin:    General: Skin is warm and dry.     Findings: Ecchymosis present.  Neurological:     General: No focal deficit present.     Mental Status: He is alert and oriented to person, place, and time.  Psychiatric:        Mood and Affect: Mood normal.        Behavior: Behavior normal.    Assessment & Plan:  1: Chronic heart failure with preserved ejection fraction- - NYHA class III - euvolemic today  - weighing daily; reminded to call for an overnight weight gain of > 2 pounds or a weekly weight gain of > 5 pounds - weight stable from last visit here 3 months ago - not adding salt to his food but does eat out often - saw cardiology (Fath) 11/21/21 - will stop his furosemide and begin torsemide '40mg'$  M,W, F & '20mg'$  the other 4 days of the week as he doesn't feel like the furosemide works as well as it did before - should he start dropping too much weight or become dizzy with complete resolution of his edema, can decrease the torsemide to '20mg'$  every day - check BMP next visit - saw palliative care 07/05/21 - participating in paramedicine program  - BNP 10/08/19 was 1004.8 - received his flu vaccine for this season  2: HTN- - BP looks good (117/51) - saw PCP Edwina Barth) 12/19/21 - BMP 12/19/21 reviewed and showed sodium 137, potassium 4.6, creatinine 1.3 and GFR 52  3: COPD/ PAH- - wearing oxygen at 2.5-3L around the clock - saw pulmonology Raul Del) 04/30/21 - using nebulizer 4 times/ day  4: Lymphedema- - stage 2 - does elevate legs when sitting for long periods of time - wearing compression socks with some improvement of edema - limited in exercise due to symptoms - saw vascular Owens Shark) 11/08/21   Medication list was reviewed.  Return in 2 weeks, sooner if needed.

## 2022-01-11 NOTE — Patient Instructions (Addendum)
Continue weighing daily and call for an overnight weight gain of 3 pounds or more or a weekly weight gain of more than 5 pounds.   If you have voicemail, please make sure your mailbox is cleaned out so that we may leave a message and please make sure to listen to any voicemails.    Stop taking furosemide and use torsemide instead on the exact same schedule

## 2022-01-16 ENCOUNTER — Ambulatory Visit (INDEPENDENT_AMBULATORY_CARE_PROVIDER_SITE_OTHER): Payer: Medicare Other | Admitting: Dermatology

## 2022-01-16 DIAGNOSIS — T148XXA Other injury of unspecified body region, initial encounter: Secondary | ICD-10-CM

## 2022-01-16 DIAGNOSIS — S81802A Unspecified open wound, left lower leg, initial encounter: Secondary | ICD-10-CM | POA: Diagnosis not present

## 2022-01-16 DIAGNOSIS — S91002A Unspecified open wound, left ankle, initial encounter: Secondary | ICD-10-CM | POA: Diagnosis not present

## 2022-01-16 DIAGNOSIS — Z85828 Personal history of other malignant neoplasm of skin: Secondary | ICD-10-CM

## 2022-01-16 DIAGNOSIS — L570 Actinic keratosis: Secondary | ICD-10-CM

## 2022-01-16 NOTE — Patient Instructions (Signed)
Actinic keratoses are precancerous spots that appear secondary to cumulative UV radiation exposure/sun exposure over time. They are chronic with expected duration over 1 year. A portion of actinic keratoses will progress to squamous cell carcinoma of the skin. It is not possible to reliably predict which spots will progress to skin cancer and so treatment is recommended to prevent development of skin cancer.  Recommend daily broad spectrum sunscreen SPF 30+ to sun-exposed areas, reapply every 2 hours as needed.  Recommend staying in the shade or wearing long sleeves, sun glasses (UVA+UVB protection) and wide brim hats (4-inch brim around the entire circumference of the hat). Call for new or changing lesions.   Cryotherapy Aftercare  Wash gently with soap and water everyday.   Apply Vaseline and Band-Aid daily until healed.   Due to recent changes in healthcare laws, you may see results of your pathology and/or laboratory studies on MyChart before the doctors have had a chance to review them. We understand that in some cases there may be results that are confusing or concerning to you. Please understand that not all results are received at the same time and often the doctors may need to interpret multiple results in order to provide you with the best plan of care or course of treatment. Therefore, we ask that you please give us 2 business days to thoroughly review all your results before contacting the office for clarification. Should we see a critical lab result, you will be contacted sooner.   If You Need Anything After Your Visit  If you have any questions or concerns for your doctor, please call our main line at [phone] and press option 4 to reach your doctor's medical assistant. If no one answers, please leave a voicemail as directed and we will return your call as soon as possible. Messages left after 4 pm will be answered the following business day.   You may also send us a message via  MyChart. We typically respond to MyChart messages within 1-2 business days.  For prescription refills, please ask your pharmacy to contact our office. Our fax number is [phone].  If you have an urgent issue when the clinic is closed that cannot wait until the next business day, you can page your doctor at the number below.    Please note that while we do our best to be available for urgent issues outside of office hours, we are not available 24/7.   If you have an urgent issue and are unable to reach us, you may choose to seek medical care at your doctor's office, retail clinic, urgent care center, or emergency room.  If you have a medical emergency, please immediately call 911 or go to the emergency department.  Pager Numbers  - Dr. Kowalski: [phone]  - Dr. Moye: [phone]  - Dr. Stewart: [phone]  In the event of inclement weather, please call our main line at [phone] for an update on the status of any delays or closures.  Dermatology Medication Tips: Please keep the boxes that topical medications come in in order to help keep track of the instructions about where and how to use these. Pharmacies typically print the medication instructions only on the boxes and not directly on the medication tubes.   If your medication is too expensive, please contact our office at [phone] option 4 or send us a message through MyChart.   We are unable to tell what your co-pay for medications will be in advance as this is different   depending on your insurance coverage. However, we may be able to find a substitute medication at lower cost or fill out paperwork to get insurance to cover a needed medication.   If a prior authorization is required to get your medication covered by your insurance company, please allow us 1-2 business days to complete this process.  Drug prices often vary depending on where the prescription is filled and some pharmacies may offer cheaper  prices.  The website www.goodrx.com contains coupons for medications through different pharmacies. The prices here do not account for what the cost may be with help from insurance (it may be cheaper with your insurance), but the website can give you the price if you did not use any insurance.  - You can print the associated coupon and take it with your prescription to the pharmacy.  - You may also stop by our office during regular business hours and pick up a GoodRx coupon card.  - If you need your prescription sent electronically to a different pharmacy, notify our office through [facility] MyChart or by phone at [phone] option 4.     Si Usted Necesita Algo Despus de Su Visita  Tambin puede enviarnos un mensaje a travs de MyChart. Por lo general respondemos a los mensajes de MyChart en el transcurso de 1 a 2 das hbiles.  Para renovar recetas, por favor pida a su farmacia que se ponga en contacto con nuestra oficina. Nuestro nmero de fax es el [phone].  Si tiene un asunto urgente cuando la clnica est cerrada y que no puede esperar hasta el siguiente da hbil, puede llamar/localizar a su doctor(a) al nmero que aparece a continuacin.   Por favor, tenga en cuenta que aunque hacemos todo lo posible para estar disponibles para asuntos urgentes fuera del horario de oficina, no estamos disponibles las 24 horas del da, los 7 das de la semana.   Si tiene un problema urgente y no puede comunicarse con nosotros, puede optar por buscar atencin mdica  en el consultorio de su doctor(a), en una clnica privada, en un centro de atencin urgente o en una sala de emergencias.  Si tiene una emergencia mdica, por favor llame inmediatamente al 911 o vaya a la sala de emergencias.  Nmeros de bper  - Dr. Kowalski: [phone]  - Dra. Moye: [phone]  - Dra. Stewart: [phone]  En caso de inclemencias del tiempo, por favor llame a nuestra lnea principal al [phone]  para una actualizacin sobre el estado de cualquier retraso o cierre.  Consejos para la medicacin en dermatologa: Por favor, guarde las cajas en las que vienen los medicamentos de uso tpico para ayudarle a seguir las instrucciones sobre dnde y cmo usarlos. Las farmacias generalmente imprimen las instrucciones del medicamento slo en las cajas y no directamente en los tubos del medicamento.   Si su medicamento es muy caro, por favor, pngase en contacto con nuestra oficina llamando al [phone] y presione la opcin 4 o envenos un mensaje a travs de MyChart.   No podemos decirle cul ser su copago por los medicamentos por adelantado ya que esto es diferente dependiendo de la cobertura de su seguro. Sin embargo, es posible que podamos encontrar un medicamento sustituto a menor costo o llenar un formulario para que el seguro cubra el medicamento que se considera necesario.   Si se requiere una autorizacin previa para que su compaa de seguros cubra su medicamento, por favor permtanos de 1 a 2 das hbiles para completar este   proceso.  Los precios de los medicamentos varan con frecuencia dependiendo del lugar de dnde se surte la receta y alguna farmacias pueden ofrecer precios ms baratos.  El sitio web www.goodrx.com tiene cupones para medicamentos de diferentes farmacias. Los precios aqu no tienen en cuenta lo que podra costar con la ayuda del seguro (puede ser ms barato con su seguro), pero el sitio web puede darle el precio si no utiliz ningn seguro.  - Puede imprimir el cupn correspondiente y llevarlo con su receta a la farmacia.  - Tambin puede pasar por nuestra oficina durante el horario de atencin regular y recoger una tarjeta de cupones de GoodRx.  - Si necesita que su receta se enve electrnicamente a una farmacia diferente, informe a nuestra oficina a travs de MyChart de [place] o por telfono llamando al [phone] y presione la opcin 4.  

## 2022-01-16 NOTE — Progress Notes (Signed)
   Follow-Up Visit   Subjective  [name] is a 88 y.o. male who presents for the following: Follow-up (4 month recheck, bcc at nose patient states looks better. Patient reports ulcer and left leg are improving and look healed. ).  The following portions of the chart were reviewed this encounter and updated as appropriate:  Tobacco  Allergies  Meds  Problems  Med Hx  Surg Hx  Fam Hx      Review of Systems: No other skin or systemic complaints except as noted in HPI or Assessment and Plan.   Objective  Well appearing patient in no apparent distress; mood and affect are within normal limits.  A focused examination was performed including Face, ears, left cheek, neck, left lower leg, left ankle. Relevant physical exam findings are noted in the Assessment and Plan.  right antihelix of ear x 1, right helix of ear x 1, left nasaofacial angle x 1 (3) Erythematous thin papules/macules with gritty scale.   left lower leg and left ankle Clear at exam    Assessment & Plan  Actinic keratosis (3) right antihelix of ear x 1, right helix of ear x 1, left nasaofacial angle x 1  Hypertrophic AK  Actinic keratoses are precancerous spots that appear secondary to cumulative UV radiation exposure/sun exposure over time. They are chronic with expected duration over 1 year. A portion of actinic keratoses will progress to squamous cell carcinoma of the skin. It is not possible to reliably predict which spots will progress to skin cancer and so treatment is recommended to prevent development of skin cancer.  Recommend daily broad spectrum sunscreen SPF 30+ to sun-exposed areas, reapply every 2 hours as needed.  Recommend staying in the shade or wearing long sleeves, sun glasses (UVA+UVB protection) and wide brim hats (4-inch brim around the entire circumference of the hat). Call for new or changing lesions.  Destruction of lesion - right antihelix of ear x 1, right helix of ear x 1, left  nasaofacial angle x 1  Destruction method: cryotherapy   Informed consent: discussed and consent obtained   Lesion destroyed using liquid nitrogen: Yes   Cryotherapy cycles:  2 Outcome: patient tolerated procedure well with no complications   Post-procedure details: wound care instructions given   Additional details:  Prior to procedure, discussed risks of blister formation, small wound, skin dyspigmentation, or rare scar following cryotherapy. Recommend Vaseline ointment to treated areas while healing.   Open wound left lower leg and left ankle  History of. Healed.    History of Basal Cell Carcinoma of the Skin At left malar cheek ED&C 09/13/2021 - No evidence of recurrence today - Recommend regular full body skin exams - Recommend daily broad spectrum sunscreen SPF 30+ to sun-exposed areas, reapply every 2 hours as needed.  - Call if any new or changing lesions are noted between office visits  Return for 3 - 4 month ak follow up.  I, Ruthell Rummage, CMA, am acting as scribe for Forest Gleason, MD.  Documentation: I have reviewed the above documentation for accuracy and completeness, and I agree with the above.  Forest Gleason, MD

## 2022-01-22 NOTE — Progress Notes (Unsigned)
Patient ID: [name], male    DOB: 1928/08/27, 88 y.o.   MRN: [id number]   [name] is a 88 y/o male with a history of CAD, HTN, previous tobacco use and chronic heart failure.   Echo report from 10/08/19 reviewed and showed an EF of 55-60% along with moderately elevated PA pressure, severe LAE/ RAE and mild Christopher. Echo report from 01/06/2019 reviewed and showed an EF of 55-60% along with mild/ moderate Christopher and moderately elevated PA pressure.   Has not been admitted or been in the ED in the last 6 months.   He presents with a chief complaint of moderate shortness of breath with minimal exertion. Describes this as chronic although he does feel like it has improved since changing his diuretic at last visit. He has associated fatigue, cough & pedal edema along with this. He denies any difficulty sleeping, dizziness, wheezing, chest pain, palpitations, abdominal distention or weight gain.   Not adding salt to his food. Did eat BBQ and potato salad on Christmas Day.   Continues to wear his oxygen at 3L.   Past Medical History:  Diagnosis Date   (HFpEF) heart failure with preserved ejection fraction (HCC)    Aortic atherosclerosis ([place])    Basal cell carcinoma 09/13/2021   Left cheek. EDC   Cardiac murmur    Grade I/VI medium pitched mid systolic blowing at lower LSB   COPD (chronic obstructive pulmonary disease) (HCC)    Coronary artery disease    Dependence on supplemental oxygen    HLD (hyperlipidemia)    Hypertension    Paroxysmal atrial fibrillation (HCC)    PVD (peripheral vascular disease) (HCC)    Skin cancer    nose and ear - tx with Mohs   Thoracic aortic ectasia (HCC)    a.) measured 3.6 x 3.3 cm on 10/08/2019   Past Surgical History:  Procedure Laterality Date   COLONOSCOPY  2007   ENDARTERECTOMY FEMORAL Right 08/09/2020   Procedure: RIGHT COMMON AND PROFUNDA FEMORAL ENDARTERECTOMY;  Surgeon: Algernon Huxley, MD;  Location: [facility];  Service: Vascular;  Laterality:  Right;   EYE SURGERY Bilateral 2005   LOWER EXTREMITY ANGIOGRAPHY Right 07/05/2020   Procedure: LOWER EXTREMITY ANGIOGRAPHY;  Surgeon: Algernon Huxley, MD;  Location: [facility];  Service: Cardiovascular;  Laterality: Right;   Renal Bypass Surgery Bilateral 2000   No family history on file. Social History   Tobacco Use   Smoking status: Former    Packs/day: 1.00    Years: 30.00    Total pack years: 30.00    Types: Cigarettes    Quit date: 1997    Years since quitting: 27.0   Smokeless tobacco: Never  Substance Use Topics   Alcohol use: Never   Allergies  Allergen Reactions   Bee Venom Anaphylaxis   Prior to Admission medications   Medication Sig Start Date End Date Taking? Authorizing Provider  Acetaminophen 500 MG capsule Take by mouth.   Yes [provider]  albuterol (PROVENTIL) (2.5 MG/3ML) 0.083% nebulizer solution Take 3 mLs (2.5 mg total) by nebulization every 6 (six) hours as needed for wheezing or shortness of breath. 01/31/19  Yes Danford, Suann Larry, MD  alendronate (FOSAMAX) 70 MG tablet Take 70 mg by mouth once a week. Sunday morning with full glass of water and sit up do not lay down 06/07/20  Yes [provider]  apixaban (ELIQUIS) 2.5 MG TABS tablet Take 2.5 mg by mouth 2 (  two) times daily.   Yes [provider]  ascorbic acid (VITAMIN C) 250 MG tablet Take 1 tablet (250 mg total) by mouth 2 (two) times daily. 08/11/20  Yes Stegmayer, Janalyn Harder, PA-C  aspirin EC 81 MG EC tablet Take 1 tablet (81 mg total) by mouth daily at 6 (six) AM. Swallow whole. 08/11/20  Yes Stegmayer, Joelene Millin A, PA-C  atorvastatin (LIPITOR) 20 MG tablet Take 1 tablet (20 mg total) by mouth daily. 08/09/21  Yes Kris Hartmann, NP  Calcium Carb-Cholecalciferol (CALCIUM 600/VITAMIN D PO) Take by mouth.   Yes [provider]  CARTIA XT 300 MG 24 hr capsule Take 300 mg by mouth daily. 09/16/19  Yes [provider]  ciclopirox (LOPROX) 0.77 % cream  Apply topically 2 (two) times daily. Apply to feet BID x 1 month. Then QW thereafter. 07/25/21  Yes Moye, [place], MD  ciclopirox (LOPROX) 0.77 % SUSP Apply once daily to affected fingernails 08/01/21  Yes Moye, [place], MD  Fluticasone-Umeclidin-Vilant (TRELEGY ELLIPTA) 100-62.5-25 MCG/INH AEPB Inhale into the lungs.   Yes [provider]  metolazone (ZAROXOLYN) 2.5 MG tablet Take 1 tablet (2.5 mg total) by mouth daily as needed. Take 1/2 hour before lasix 11/19/21  Yes Marja Adderley, [name] A, FNP  metoprolol tartrate (LOPRESSOR) 50 MG tablet Take 50 mg by mouth 2 (two) times daily.   Yes [provider]  Multiple Vitamin (MULTIVITAMIN WITH MINERALS) TABS tablet Take 1 tablet by mouth daily. 08/12/20  Yes Stegmayer, Janalyn Harder, PA-C  mupirocin ointment (BACTROBAN) 2 % Apply 1 Application topically daily. Apply to wounds once daily until healed. 07/25/21  Yes Moye, [place], MD  potassium chloride (KLOR-CON) 10 MEQ tablet Take 2 tablets (20 mEq total) by mouth daily. 01/14/20  Yes Rayce Brahmbhatt, [name] A, FNP  predniSONE (DELTASONE) 10 MG tablet Take 5 mg by mouth daily with breakfast.   Yes [provider]  Probiotic Product (PROBIOTIC GUMMIES) 30 MG CHEW Chew by mouth.   Yes [provider]  senna-docusate (SENOKOT-S) 8.6-50 MG tablet Take 1 tablet by mouth daily.   Yes [provider]  torsemide (DEMADEX) 20 MG tablet Take '40mg'$  on M, W, F and '20mg'$  on Tu, Thur,Sat & Sunday 01/11/22  Yes Braylin Formby, [name] A, FNP  Vitamin D, Ergocalciferol, (DRISDOL) 1.25 MG (50000 UNIT) CAPS capsule Take 1 capsule (50,000 Units total) by mouth every 7 (seven) days. 10/15/19  Yes Wouk, Ailene Rud, MD  Wheat Dextrin (BENEFIBER PO) Take by mouth. 2 teaspoons twice a day   Yes [provider]  Zinc Oxide 40 % PSTE Apply once daily with band-aid change 09/13/21  Yes Moye, [place], MD   Review of Systems  Constitutional:  Positive for fatigue. Negative for appetite change.  HENT:   Negative for congestion, postnasal drip and sore throat.   Eyes: Negative.   Respiratory:  Positive for cough and shortness of breath (improving). Negative for wheezing.   Cardiovascular:  Positive for leg swelling (improving). Negative for chest pain and palpitations.  Gastrointestinal:  Negative for abdominal distention and abdominal pain.  Endocrine: Negative.   Genitourinary: Negative.   Musculoskeletal:  Negative for arthralgias and back pain.  Skin: Negative.   Allergic/Immunologic: Negative.   Neurological:  Negative for dizziness and light-headedness.  Hematological:  Negative for adenopathy. Does not bruise/bleed easily.  Psychiatric/Behavioral:  Negative for dysphoric mood and sleep disturbance (sleeping on 2 pillows). The patient is not nervous/anxious.    Vitals:   01/23/22 1153  BP: (!) 122/58  Pulse: 64  Resp: 16  SpO2: 93%  Weight: 143 lb 8 oz (65.1 kg)   Wt Readings from Last 3 Encounters:  01/23/22 143 lb 8 oz (65.1 kg)  01/11/22 146 lb 6 oz (66.4 kg)  11/15/21 145 lb (65.8 kg)   Lab Results  Component Value Date   CREATININE 1.36 (H) 01/23/2022   CREATININE 1.21 03/20/2021   CREATININE 1.13 03/16/2021   Physical Exam Vitals and nursing note reviewed.  Constitutional:      Appearance: He is well-developed.  HENT:     Head: Normocephalic and atraumatic.  Neck:     Vascular: No JVD.  Cardiovascular:     Rate and Rhythm: Normal rate and regular rhythm.  Pulmonary:     Effort: Pulmonary effort is normal. No respiratory distress.     Breath sounds: No wheezing or rales.  Abdominal:     Palpations: Abdomen is soft.     Tenderness: There is no abdominal tenderness.  Musculoskeletal:        General: No tenderness.     Cervical back: Normal range of motion and neck supple.     Right lower leg: No tenderness. Edema (trace pitting) present.     Left lower leg: No tenderness. Edema (trace pitting) present.  Skin:    General: Skin is warm and dry.      Findings: Ecchymosis present.  Neurological:     General: No focal deficit present.     Mental Status: He is alert and oriented to person, place, and time.  Psychiatric:        Mood and Affect: Mood normal.        Behavior: Behavior normal.    Assessment & Plan:  1: Chronic heart failure with preserved ejection fraction- - NYHA class III - euvolemic today  - weighing daily; reminded to call for an overnight weight gain of > 2 pounds or a weekly weight gain of > 5 pounds - weight down 3 pounds from last visit here 2 weeks ago - not adding salt to his food but does eat out often; did eat BBQ and potato salad on Christmas Day - saw cardiology (Fath) 11/21/21 - check BMP today since diuretic changed to torsemide at last visit; subjectively he says that he feels better with torsemide and is voiding more than he was - saw palliative care 07/05/21 - participating in paramedicine program  - BNP 10/08/19 was 1004.8 - received his flu vaccine for this season  2: HTN- - BP looks good (122/58) - saw PCP Edwina Barth) 12/19/21 - BMP 12/19/21 reviewed and showed sodium 137, potassium 4.6, creatinine 1.3 and GFR 52  3: COPD/ PAH- - wearing oxygen at 2.5-3L around the clock - saw pulmonology Raul Del) 04/30/21 - using nebulizer 4 times/ day  4: Lymphedema- - stage 2 - does elevate legs when sitting for long periods of time - wearing compression socks with some improvement of edema - limited in exercise due to symptoms - saw vascular Owens Shark) 11/08/21   Medication list was reviewed.  Return in 2 months, sooner if needed.

## 2022-01-23 ENCOUNTER — Telehealth: Payer: Self-pay | Admitting: Family

## 2022-01-23 ENCOUNTER — Ambulatory Visit: Payer: Medicare Other | Attending: Family | Admitting: Family

## 2022-01-23 ENCOUNTER — Encounter: Payer: Self-pay | Admitting: Dermatology

## 2022-01-23 ENCOUNTER — Encounter: Payer: Self-pay | Admitting: Family

## 2022-01-23 VITALS — BP 122/58 | HR 64 | Resp 16 | Wt 143.5 lb

## 2022-01-23 DIAGNOSIS — M7989 Other specified soft tissue disorders: Secondary | ICD-10-CM | POA: Diagnosis not present

## 2022-01-23 DIAGNOSIS — R0602 Shortness of breath: Secondary | ICD-10-CM | POA: Insufficient documentation

## 2022-01-23 DIAGNOSIS — I5032 Chronic diastolic (congestive) heart failure: Secondary | ICD-10-CM | POA: Diagnosis present

## 2022-01-23 DIAGNOSIS — I890 Lymphedema, not elsewhere classified: Secondary | ICD-10-CM | POA: Diagnosis not present

## 2022-01-23 DIAGNOSIS — J449 Chronic obstructive pulmonary disease, unspecified: Secondary | ICD-10-CM | POA: Diagnosis not present

## 2022-01-23 DIAGNOSIS — R609 Edema, unspecified: Secondary | ICD-10-CM | POA: Diagnosis not present

## 2022-01-23 DIAGNOSIS — R5383 Other fatigue: Secondary | ICD-10-CM | POA: Diagnosis not present

## 2022-01-23 DIAGNOSIS — I2510 Atherosclerotic heart disease of native coronary artery without angina pectoris: Secondary | ICD-10-CM | POA: Insufficient documentation

## 2022-01-23 DIAGNOSIS — I10 Essential (primary) hypertension: Secondary | ICD-10-CM | POA: Diagnosis not present

## 2022-01-23 DIAGNOSIS — I110 Hypertensive heart disease with heart failure: Secondary | ICD-10-CM | POA: Diagnosis not present

## 2022-01-23 DIAGNOSIS — Z9981 Dependence on supplemental oxygen: Secondary | ICD-10-CM | POA: Insufficient documentation

## 2022-01-23 DIAGNOSIS — Z87891 Personal history of nicotine dependence: Secondary | ICD-10-CM | POA: Diagnosis not present

## 2022-01-23 LAB — BASIC METABOLIC PANEL
Anion gap: 9 (ref 5–15)
BUN: 48 mg/dL — ABNORMAL HIGH (ref 8–23)
CO2: 35 mmol/L — ABNORMAL HIGH (ref 22–32)
Calcium: 11.3 mg/dL — ABNORMAL HIGH (ref 8.9–10.3)
Chloride: 96 mmol/L — ABNORMAL LOW (ref 98–111)
Creatinine, Ser: 1.36 mg/dL — ABNORMAL HIGH (ref 0.61–1.24)
GFR, Estimated: 49 mL/min — ABNORMAL LOW (ref >60)
Glucose, Bld: 104 mg/dL — ABNORMAL HIGH (ref 70–99)
Potassium: 4 mmol/L (ref 3.5–5.1)
Sodium: 140 mmol/L (ref 135–145)

## 2022-01-23 NOTE — Telephone Encounter (Signed)
Spoke with patient's daughter, [name], regarding lab results obtained earlier today. Kidney function has declined some since changing his diuretic to torsemide. Will continue medications at this time as patient says that he feels better and is breathing better. Can always back off on 1 tablet if weight is stable at home. Will plan on rechecking labs at next visit if not done elsewhere. [name] verbalized understanding

## 2022-01-23 NOTE — Patient Instructions (Signed)
Continue weighing daily and call for an overnight weight gain of 3 pounds or more or a weekly weight gain of more than 5 pounds.   If you have voicemail, please make sure your mailbox is cleaned out so that we may leave a message and please make sure to listen to any voicemails.     

## 2022-01-25 ENCOUNTER — Other Ambulatory Visit
Admission: RE | Admit: 2022-01-25 | Discharge: 2022-01-25 | Disposition: A | Discharge status: Home / Self Care | Payer: Medicare Other | Source: Ambulatory Visit | Attending: Family | Admitting: Family

## 2022-01-25 DIAGNOSIS — I5032 Chronic diastolic (congestive) heart failure: Secondary | ICD-10-CM | POA: Insufficient documentation

## 2022-01-31 ENCOUNTER — Telehealth (HOSPITAL_COMMUNITY): Payer: Self-pay

## 2022-01-31 NOTE — Telephone Encounter (Signed)
[name] contacted me concerned with her Dads weight.  She states he is down to 139 lbs.  He has been enough fluids.  He has been feeling fine and has no complaints.  Contacted Tina with HF clinic and she advised to cut back doubling 3 times a week to twice a week.  He will now double on Monday and Fridays starting next week.  [name] appeared to understand and will advise her dad.  Set up a home visit for next week on Thursday.  Will visit for heart failure, diet and medication management.   [place] [phone]

## 2022-02-07 ENCOUNTER — Other Ambulatory Visit (HOSPITAL_COMMUNITY): Payer: Self-pay

## 2022-02-12 ENCOUNTER — Encounter (HOSPITAL_COMMUNITY): Payer: Self-pay

## 2022-02-12 NOTE — Progress Notes (Signed)
Had a home visit with [name].  He is doing well.  Concerned with his daughter that had knee replacement yesterday.  He is home alone, he has family to call if needs something.  He denies any problems.  Has everything for daily living.  Has oxygen set at 3 lpm all the time.  Lungs are clear, no edema noted.  Abdomen is soft.  Having regular bowel movements.  He weighs daily.  He is aware of up coming appts.  He has all his medications and aware of how to take them.  Will continue to visit for heart failure, diet and medication management.   [place] [phone]

## 2022-02-28 ENCOUNTER — Observation Stay: Payer: Medicare Other

## 2022-02-28 ENCOUNTER — Other Ambulatory Visit: Payer: Self-pay

## 2022-02-28 ENCOUNTER — Observation Stay
Admission: EM | Admit: 2022-02-28 | Discharge: 2022-03-01 | Disposition: A | Discharge status: Home / Self Care | Payer: Medicare Other | Attending: Student | Admitting: Student

## 2022-02-28 ENCOUNTER — Emergency Department: Payer: Medicare Other

## 2022-02-28 DIAGNOSIS — Z87891 Personal history of nicotine dependence: Secondary | ICD-10-CM | POA: Diagnosis not present

## 2022-02-28 DIAGNOSIS — Z7901 Long term (current) use of anticoagulants: Secondary | ICD-10-CM | POA: Insufficient documentation

## 2022-02-28 DIAGNOSIS — I2510 Atherosclerotic heart disease of native coronary artery without angina pectoris: Secondary | ICD-10-CM | POA: Insufficient documentation

## 2022-02-28 DIAGNOSIS — J449 Chronic obstructive pulmonary disease, unspecified: Secondary | ICD-10-CM | POA: Diagnosis not present

## 2022-02-28 DIAGNOSIS — I482 Chronic atrial fibrillation, unspecified: Secondary | ICD-10-CM

## 2022-02-28 DIAGNOSIS — Z85828 Personal history of other malignant neoplasm of skin: Secondary | ICD-10-CM | POA: Diagnosis not present

## 2022-02-28 DIAGNOSIS — H53139 Sudden visual loss, unspecified eye: Secondary | ICD-10-CM | POA: Diagnosis present

## 2022-02-28 DIAGNOSIS — Z7982 Long term (current) use of aspirin: Secondary | ICD-10-CM | POA: Insufficient documentation

## 2022-02-28 DIAGNOSIS — H53132 Sudden visual loss, left eye: Secondary | ICD-10-CM

## 2022-02-28 DIAGNOSIS — I10 Essential (primary) hypertension: Secondary | ICD-10-CM | POA: Diagnosis present

## 2022-02-28 DIAGNOSIS — I110 Hypertensive heart disease with heart failure: Secondary | ICD-10-CM | POA: Diagnosis not present

## 2022-02-28 DIAGNOSIS — I480 Paroxysmal atrial fibrillation: Secondary | ICD-10-CM | POA: Insufficient documentation

## 2022-02-28 DIAGNOSIS — I5032 Chronic diastolic (congestive) heart failure: Secondary | ICD-10-CM | POA: Insufficient documentation

## 2022-02-28 DIAGNOSIS — Z79899 Other long term (current) drug therapy: Secondary | ICD-10-CM | POA: Insufficient documentation

## 2022-02-28 DIAGNOSIS — I6521 Occlusion and stenosis of right carotid artery: Secondary | ICD-10-CM | POA: Insufficient documentation

## 2022-02-28 DIAGNOSIS — H5462 Unqualified visual loss, left eye, normal vision right eye: Secondary | ICD-10-CM | POA: Diagnosis not present

## 2022-02-28 DIAGNOSIS — Z9981 Dependence on supplemental oxygen: Secondary | ICD-10-CM | POA: Diagnosis not present

## 2022-02-28 LAB — URINE DRUG SCREEN, QUALITATIVE (ARMC ONLY)
Amphetamines, Ur Screen: NOT DETECTED
Barbiturates, Ur Screen: NOT DETECTED
Benzodiazepine, Ur Scrn: NOT DETECTED
Cannabinoid 50 Ng, Ur ~~LOC~~: NOT DETECTED
Cocaine Metabolite,Ur ~~LOC~~: NOT DETECTED
MDMA (Ecstasy)Ur Screen: NOT DETECTED
Methadone Scn, Ur: NOT DETECTED
Opiate, Ur Screen: NOT DETECTED
Phencyclidine (PCP) Ur S: NOT DETECTED
Tricyclic, Ur Screen: NOT DETECTED

## 2022-02-28 LAB — COMPREHENSIVE METABOLIC PANEL
ALT: 18 U/L (ref 0–44)
AST: 28 U/L (ref 15–41)
Albumin: 3.9 g/dL (ref 3.5–5.0)
Alkaline Phosphatase: 57 U/L (ref 38–126)
Anion gap: 11 (ref 5–15)
BUN: 48 mg/dL — ABNORMAL HIGH (ref 8–23)
CO2: 30 mmol/L (ref 22–32)
Calcium: 10.5 mg/dL — ABNORMAL HIGH (ref 8.9–10.3)
Chloride: 96 mmol/L — ABNORMAL LOW (ref 98–111)
Creatinine, Ser: 1.36 mg/dL — ABNORMAL HIGH (ref 0.61–1.24)
GFR, Estimated: 49 mL/min — ABNORMAL LOW (ref >60)
Glucose, Bld: 106 mg/dL — ABNORMAL HIGH (ref 70–99)
Potassium: 4 mmol/L (ref 3.5–5.1)
Sodium: 137 mmol/L (ref 135–145)
Total Bilirubin: 0.9 mg/dL (ref 0.3–1.2)
Total Protein: 7.3 g/dL (ref 6.5–8.1)

## 2022-02-28 LAB — CBC WITH DIFFERENTIAL/PLATELET
Abs Immature Granulocytes: 0.06 10*3/uL (ref 0.00–0.07)
Basophils Absolute: 0.1 10*3/uL (ref 0.0–0.1)
Basophils Relative: 1 %
Eosinophils Absolute: 0.2 10*3/uL (ref 0.0–0.5)
Eosinophils Relative: 1 %
HCT: 33.2 % — ABNORMAL LOW (ref 39.0–52.0)
Hemoglobin: 10.7 g/dL — ABNORMAL LOW (ref 13.0–17.0)
Immature Granulocytes: 1 %
Lymphocytes Relative: 20 %
Lymphs Abs: 2.3 10*3/uL (ref 0.7–4.0)
MCH: 30.8 pg (ref 26.0–34.0)
MCHC: 32.2 g/dL (ref 30.0–36.0)
MCV: 95.7 fL (ref 80.0–100.0)
Monocytes Absolute: 1.2 10*3/uL — ABNORMAL HIGH (ref 0.1–1.0)
Monocytes Relative: 10 %
Neutro Abs: 7.5 10*3/uL (ref 1.7–7.7)
Neutrophils Relative %: 67 %
Platelets: 252 10*3/uL (ref 150–400)
RBC: 3.47 MIL/uL — ABNORMAL LOW (ref 4.22–5.81)
RDW: 14.2 % (ref 11.5–15.5)
WBC: 11.2 10*3/uL — ABNORMAL HIGH (ref 4.0–10.5)
nRBC: 0 % (ref 0.0–0.2)

## 2022-02-28 LAB — LIPID PANEL
Cholesterol: 158 mg/dL (ref 0–200)
HDL: 56 mg/dL (ref >40)
LDL Cholesterol: 91 mg/dL (ref 0–99)
Total CHOL/HDL Ratio: 2.8 RATIO
Triglycerides: 55 mg/dL (ref <150)
VLDL: 11 mg/dL (ref 0–40)

## 2022-02-28 LAB — ETHANOL: Alcohol, Ethyl (B): <10 mg/dL (ref <10)

## 2022-02-28 LAB — APTT: aPTT: 32 seconds (ref 24–36)

## 2022-02-28 LAB — SEDIMENTATION RATE: Sed Rate: 41 mm/hr — ABNORMAL HIGH (ref 0–20)

## 2022-02-28 LAB — HEMOGLOBIN A1C
Hgb A1c MFr Bld: 6.3 % — ABNORMAL HIGH (ref 4.8–5.6)
Mean Plasma Glucose: 134.11 mg/dL

## 2022-02-28 LAB — C-REACTIVE PROTEIN: CRP: 1 mg/dL — ABNORMAL HIGH (ref <1.0)

## 2022-02-28 LAB — PROTIME-INR
INR: 1.5 — ABNORMAL HIGH (ref 0.8–1.2)
Prothrombin Time: 17.9 seconds — ABNORMAL HIGH (ref 11.4–15.2)

## 2022-02-28 MED ORDER — GADOBUTROL 1 MMOL/ML IV SOLN
6.0000 mL | Freq: Once | INTRAVENOUS | Status: AC | PRN
  Administered 2022-02-28: 6 mL via INTRAVENOUS

## 2022-02-28 MED ORDER — ASPIRIN 81 MG PO TBEC
81.0000 mg | DELAYED_RELEASE_TABLET | Freq: Every day | ORAL | Status: DC
  Administered 2022-03-01: 81 mg via ORAL
  Filled 2022-02-28 (×2): qty 1

## 2022-02-28 MED ORDER — METOPROLOL TARTRATE 50 MG PO TABS
50.0000 mg | ORAL_TABLET | Freq: Two times a day (BID) | ORAL | Status: DC
  Administered 2022-02-28 – 2022-03-01 (×2): 50 mg via ORAL
  Filled 2022-02-28 (×2): qty 2

## 2022-02-28 MED ORDER — DILTIAZEM HCL ER COATED BEADS 300 MG PO CP24
300.0000 mg | ORAL_CAPSULE | Freq: Every day | ORAL | Status: DC
  Administered 2022-03-01: 300 mg via ORAL
  Filled 2022-02-28: qty 1

## 2022-02-28 MED ORDER — ALBUTEROL SULFATE (2.5 MG/3ML) 0.083% IN NEBU: 2.5000 mg | INHALATION_SOLUTION | Freq: Four times a day (QID) | RESPIRATORY_TRACT | Status: DC | PRN

## 2022-02-28 MED ORDER — ACETAMINOPHEN 160 MG/5ML PO SOLN: 650.0000 mg | ORAL | Status: DC | PRN

## 2022-02-28 MED ORDER — POTASSIUM CHLORIDE CRYS ER 20 MEQ PO TBCR
20.0000 meq | EXTENDED_RELEASE_TABLET | Freq: Every day | ORAL | Status: DC
  Administered 2022-03-01: 20 meq via ORAL
  Filled 2022-02-28: qty 1

## 2022-02-28 MED ORDER — ATORVASTATIN CALCIUM 20 MG PO TABS
20.0000 mg | ORAL_TABLET | Freq: Every day | ORAL | Status: DC
  Administered 2022-03-01: 20 mg via ORAL
  Filled 2022-02-28: qty 1

## 2022-02-28 MED ORDER — TORSEMIDE 20 MG PO TABS
40.0000 mg | ORAL_TABLET | ORAL | Status: DC
  Administered 2022-03-01: 40 mg via ORAL
  Filled 2022-02-28: qty 2

## 2022-02-28 MED ORDER — PREDNISONE 10 MG PO TABS
5.0000 mg | ORAL_TABLET | Freq: Every day | ORAL | Status: DC
  Administered 2022-03-01: 5 mg via ORAL
  Filled 2022-02-28: qty 1

## 2022-02-28 MED ORDER — ACETAMINOPHEN 325 MG PO TABS: 650.0000 mg | ORAL_TABLET | ORAL | Status: DC | PRN

## 2022-02-28 MED ORDER — ACETAMINOPHEN 650 MG RE SUPP: 650.0000 mg | RECTAL | Status: DC | PRN

## 2022-02-28 MED ORDER — SENNOSIDES-DOCUSATE SODIUM 8.6-50 MG PO TABS: 1.0000 | ORAL_TABLET | Freq: Every evening | ORAL | Status: DC | PRN

## 2022-02-28 MED ORDER — TORSEMIDE 20 MG PO TABS: 20.0000 mg | ORAL_TABLET | Freq: Every day | ORAL | Status: DC

## 2022-02-28 MED ORDER — TORSEMIDE 20 MG PO TABS
20.0000 mg | ORAL_TABLET | ORAL | Status: DC
  Administered 2022-02-28: 20 mg via ORAL
  Filled 2022-02-28: qty 1

## 2022-02-28 MED ORDER — UMECLIDINIUM BROMIDE 62.5 MCG/ACT IN AEPB
1.0000 | INHALATION_SPRAY | Freq: Every day | RESPIRATORY_TRACT | Status: DC
  Administered 2022-03-01: 1 via RESPIRATORY_TRACT
  Filled 2022-02-28: qty 7

## 2022-02-28 MED ORDER — APIXABAN 2.5 MG PO TABS
2.5000 mg | ORAL_TABLET | Freq: Two times a day (BID) | ORAL | Status: DC
  Filled 2022-02-28: qty 1

## 2022-02-28 MED ORDER — FLUTICASONE FUROATE-VILANTEROL 100-25 MCG/ACT IN AEPB
1.0000 | INHALATION_SPRAY | Freq: Every day | RESPIRATORY_TRACT | Status: DC
  Administered 2022-03-01: 1 via RESPIRATORY_TRACT
  Filled 2022-02-28: qty 28

## 2022-02-28 NOTE — ED Notes (Signed)
Pt transported to MRI 

## 2022-02-28 NOTE — ED Triage Notes (Signed)
Pt here with an left eye problem that started on Sunday. Pt went to his optometrist who suspects pt is or had a stroke. Pt states he is having trouble seeing out of that eye. Pt denies weakness.

## 2022-02-28 NOTE — Assessment & Plan Note (Signed)
Patient presenting with sudden onset left vision loss concerning for CVA.  No return of vision since onset approximately 4 days ago.  Multiple risk factors for CVA including hypertension, hyperlipidemia, atrial fibrillation although on anticoagulation.  - Neurology consulted; appreciate their recommendations - MRI brain pending - MRA head/neck pending - No telemetry monitoring given patient has known permanent A-fib - Out of window for permissive hypertension. Gradually normalize blood pressure - A1C pending - ASA 81 mg daily - Hold home Eliquis until neurology evaluates or MRI results return - Increase home statin to 40 mg daily due to LDL above goal - PT/OT/SLP

## 2022-02-28 NOTE — ED Provider Notes (Signed)
[facility] Provider Note    Event Date/Time   First MD Initiated Contact with Patient 02/28/22 1211     (approximate)   History   Chief Complaint: Eye Problem   HPI  [name] is a 89 y.o. male with a history of hypertension COPD diastolic heart failure atrial fibrillation who comes the ED complaining of vision loss in the left eye that started 4 days ago.  Constant, no aggravating or alleviating factors.  Saw cytometry today who felt that the eye itself was okay and was worried for stroke and sent the patient to the ED.  Patient denies any headache, change in speech, weakness, or paresthesias.  No recent trauma.     Physical Exam   Triage Vital Signs: ED Triage Vitals  Enc Vitals Group     BP 02/28/22 1205 (!) 140/56     Pulse Rate 02/28/22 1205 (!) 55     Resp 02/28/22 1205 18     Temp 02/28/22 1205 97.9 F (36.6 C)     Temp Source 02/28/22 1205 Oral     SpO2 02/28/22 1215 98 %     Weight 02/28/22 1206 143 lb 1.3 oz (64.9 kg)     Height 02/28/22 1206 6' (1.829 m)     Head Circumference --      Peak Flow --      Pain Score 02/28/22 1206 0     Pain Loc --      Pain Edu? --      Excl. in [place]? --     Most recent vital signs: Vitals:   02/28/22 1245 02/28/22 1500  BP: (!) 158/70 (!) 160/75  Pulse: 68   Resp: 16   Temp:    SpO2: 100%     General: Awake, no distress.  CV:  Good peripheral perfusion.  Resp:  Normal effort.  Abd:  No distention.  Other:  Left eye light perception only.  Funduscopy left eye appears grossly normal.  NIH stroke scale 1   ED Results / Procedures / Treatments   Labs (all labs ordered are listed, but only abnormal results are displayed) Labs Reviewed  PROTIME-INR - Abnormal; Notable for the following components:      Result Value   Prothrombin Time 17.9 (*)    INR 1.5 (*)    All other components within normal limits  COMPREHENSIVE METABOLIC PANEL - Abnormal; Notable for the following components:    Chloride 96 (*)    Glucose, Bld 106 (*)    BUN 48 (*)    Creatinine, Ser 1.36 (*)    Calcium 10.5 (*)    GFR, Estimated 49 (*)    All other components within normal limits  CBC WITH DIFFERENTIAL/PLATELET - Abnormal; Notable for the following components:   WBC 11.2 (*)    RBC 3.47 (*)    Hemoglobin 10.7 (*)    HCT 33.2 (*)    Monocytes Absolute 1.2 (*)    All other components within normal limits  SEDIMENTATION RATE - Abnormal; Notable for the following components:   Sed Rate 41 (*)    All other components within normal limits  ETHANOL  APTT  URINE DRUG SCREEN, QUALITATIVE ([facility] ONLY)  C-REACTIVE PROTEIN     EKG Interpreted by me Atrial fibrillation rate of 78.  Normal axis and intervals.  Right bundle branch block.  No acute ischemic changes.   RADIOLOGY CT head interpreted by me, negative for mass or intracranial hemorrhage.  Radiology report reviewed   PROCEDURES:  Procedures   MEDICATIONS ORDERED IN ED: Medications - No data to display   IMPRESSION / MDM / [place] / ED COURSE  I reviewed the triage vital signs and the nursing notes.  DDx: Intracranial mass, intracranial hemorrhage, retinal detachment, CRAO, ischemic stroke  [name] presentation is most consistent with acute presentation with potential threat to life or bodily function.  Patient comes to the ED with subacute monocular vision loss.  CT head and labs are unremarkable.  Discussed with neurology who recommends hospitalization for further workup.  Patient notes he has not taken his torsemide and potassium supplement today.  Also notes that on Monday and Friday he takes a double dose of torsemide.       FINAL CLINICAL IMPRESSION(S) / ED DIAGNOSES   Final diagnoses:  Vision loss of left eye  Chronic atrial fibrillation ([place])     Rx / DC Orders   ED Discharge Orders     None        Note:  This document was prepared using Dragon voice recognition software and may  include unintentional dictation errors.   Carrie Mew, MD 02/28/22 [phone]

## 2022-02-28 NOTE — Assessment & Plan Note (Addendum)
Patient has mild expiratory wheezing throughout, however he states his breathing is about at his baseline.  - Continue home supplemental oxygen - Start Breo and Incruse to replace home Trelegy - Albuterol nebs every 6 hours as needed

## 2022-02-28 NOTE — H&P (Signed)
History and Physical    Patient: [name] SWH:[id number] DOB: 1928/10/17 DOA: 02/28/2022 DOS: the patient was seen and examined on 02/28/2022 PCP: Baxter Hire, MD  Patient coming from: Home  Chief Complaint:  Chief Complaint  Patient presents with   Eye Problem   HPI: [name] is a 89 y.o. male with medical history significant of HFpEF with last EF of 55-60%, chronic hypoxic respiratory failure on 3 L, paroxysmal A-fib, coronary artery disease, COPD, hypertension, hyperlipidemia, who presents to the ED with complaints of vision loss.  [name] states he was in his usual state of health on 02/24/2022 when he was sitting on his recliner.  He was trying to bend over to see something out of the window and when he quickly sat up, he stated "my vision just flickered out."  He states he lost complete vision throughout the left eye, but is still able to see some bright lights from the lateral upper aspect.  He denies any prior symptoms such as these.  He denies any dizziness, headache, focal weakness, nausea, vomiting, chest pain or palpitations.  He states that he has chronic shortness of breath and this seems about the same.  ED course: On arrival to the ED, patient was hypertensive at 158/70 with heart rate of 65.  He was saturating at 100% on home 3 L.  Initial workup remarkable for WBC of 11.2, hemoglobin of 10.7, glucose 106, BUN 48, creatinine 1.36, calcium 10.5, and GFR 49.  INR elevated at 1.5.  ESR elevated at 41.  CT head was obtained with no acute intracranial abnormalities. Neurology was consulted; they recommended admission for evaluation of CRAO.  TR[facility] contacted for admission.  Review of Systems: As mentioned in the history of present illness. All other systems reviewed and are negative. Past Medical History:  Diagnosis Date   (HFpEF) heart failure with preserved ejection fraction (HCC)    Aortic atherosclerosis ([place])    Basal cell carcinoma 09/13/2021   Left  cheek. EDC   Cardiac murmur    Grade I/VI medium pitched mid systolic blowing at lower LSB   COPD (chronic obstructive pulmonary disease) (HCC)    Coronary artery disease    Dependence on supplemental oxygen    HLD (hyperlipidemia)    Hypertension    Paroxysmal atrial fibrillation (HCC)    PVD (peripheral vascular disease) (HCC)    Skin cancer    nose and ear - tx with Mohs   Thoracic aortic ectasia (HCC)    a.) measured 3.6 x 3.3 cm on 10/08/2019   Past Surgical History:  Procedure Laterality Date   COLONOSCOPY  2007   ENDARTERECTOMY FEMORAL Right 08/09/2020   Procedure: RIGHT COMMON AND PROFUNDA FEMORAL ENDARTERECTOMY;  Surgeon: Algernon Huxley, MD;  Location: [facility];  Service: Vascular;  Laterality: Right;   EYE SURGERY Bilateral 2005   LOWER EXTREMITY ANGIOGRAPHY Right 07/05/2020   Procedure: LOWER EXTREMITY ANGIOGRAPHY;  Surgeon: Algernon Huxley, MD;  Location: [facility];  Service: Cardiovascular;  Laterality: Right;   Renal Bypass Surgery Bilateral 2000   Social History:  reports that he quit smoking about 27 years ago. His smoking use included cigarettes. He has a 30.00 pack-year smoking history. He has never used smokeless tobacco. He reports that he does not drink alcohol and does not use drugs.  Allergies  Allergen Reactions   Bee Venom Anaphylaxis    No family history on file.  Prior to Admission medications   Medication  Sig Start Date End Date Taking? Authorizing Provider  Acetaminophen 500 MG capsule Take by mouth.    [provider]  albuterol (PROVENTIL) (2.5 MG/3ML) 0.083% nebulizer solution Take 3 mLs (2.5 mg total) by nebulization every 6 (six) hours as needed for wheezing or shortness of breath. 01/31/19   Danford, Suann Larry, MD  alendronate (FOSAMAX) 70 MG tablet Take 70 mg by mouth once a week. Sunday morning with full glass of water and sit up do not lay down 06/07/20   [provider]  apixaban (ELIQUIS) 2.5 MG TABS tablet Take  2.5 mg by mouth 2 (two) times daily.    [provider]  ascorbic acid (VITAMIN C) 250 MG tablet Take 1 tablet (250 mg total) by mouth 2 (two) times daily. 08/11/20   Stegmayer, Janalyn Harder, PA-C  aspirin EC 81 MG EC tablet Take 1 tablet (81 mg total) by mouth daily at 6 (six) AM. Swallow whole. 08/11/20   Stegmayer, Joelene Millin A, PA-C  atorvastatin (LIPITOR) 20 MG tablet Take 1 tablet (20 mg total) by mouth daily. 08/09/21   Kris Hartmann, NP  Calcium Carb-Cholecalciferol (CALCIUM 600/VITAMIN D PO) Take by mouth.    [provider]  CARTIA XT 300 MG 24 hr capsule Take 300 mg by mouth daily. 09/16/19   [provider]  ciclopirox (LOPROX) 0.77 % cream Apply topically 2 (two) times daily. Apply to feet BID x 1 month. Then QW thereafter. 07/25/21   Moye, [place], MD  ciclopirox (LOPROX) 0.77 % SUSP Apply once daily to affected fingernails 08/01/21   Moye, [place], MD  Fluticasone-Umeclidin-Vilant (TRELEGY ELLIPTA) 100-62.5-25 MCG/INH AEPB Inhale into the lungs.    [provider]  metolazone (ZAROXOLYN) 2.5 MG tablet Take 1 tablet (2.5 mg total) by mouth daily as needed. Take 1/2 hour before lasix Patient not taking: Reported on 02/12/2022 11/19/21   Alisa Graff, FNP  metoprolol tartrate (LOPRESSOR) 50 MG tablet Take 50 mg by mouth 2 (two) times daily.    [provider]  Multiple Vitamin (MULTIVITAMIN WITH MINERALS) TABS tablet Take 1 tablet by mouth daily. 08/12/20   Stegmayer, Janalyn Harder, PA-C  mupirocin ointment (BACTROBAN) 2 % Apply 1 Application topically daily. Apply to wounds once daily until healed. 07/25/21   Moye, [place], MD  potassium chloride (KLOR-CON) 10 MEQ tablet Take 2 tablets (20 mEq total) by mouth daily. 01/14/20   Alisa Graff, FNP  predniSONE (DELTASONE) 10 MG tablet Take 5 mg by mouth daily with breakfast.    [provider]  Probiotic Product (PROBIOTIC GUMMIES) 30 MG CHEW Chew by mouth.    [provider]   senna-docusate (SENOKOT-S) 8.6-50 MG tablet Take 1 tablet by mouth daily.    [provider]  torsemide (DEMADEX) 20 MG tablet Take '40mg'$  on M, W, F and '20mg'$  on Tu, Thur,Sat & Sunday 01/11/22   Alisa Graff, FNP  Vitamin D, Ergocalciferol, (DRISDOL) 1.25 MG (50000 UNIT) CAPS capsule Take 1 capsule (50,000 Units total) by mouth every 7 (seven) days. 10/15/19   Wouk, Ailene Rud, MD  Wheat Dextrin (BENEFIBER PO) Take by mouth. 2 teaspoons twice a day    [provider]  Zinc Oxide 40 % PSTE Apply once daily with band-aid change 09/13/21   Laurence Ferrari, [place], MD    Physical Exam: Vitals:   02/28/22 1245 02/28/22 1500 02/28/22 1600 02/28/22 1630  BP: (!) 158/70 (!) 160/75 (!) 182/66 (!) 163/68  Pulse: 68     Resp: 16  Temp:    97.9 F (36.6 C)  TempSrc:      SpO2: 100%     Weight:      Height:       Physical Exam Vitals and nursing note reviewed.  Constitutional:      General: He is not in acute distress.    Appearance: He is normal weight. He is not toxic-appearing.  HENT:     Head: Normocephalic and atraumatic.     Comments: No temporal tenderness    Mouth/Throat:     Mouth: Mucous membranes are moist.     Pharynx: Oropharynx is clear.  Eyes:     General: No scleral icterus.       Right eye: No discharge.        Left eye: No discharge.     Extraocular Movements: Extraocular movements intact.     Conjunctiva/sclera: Conjunctivae normal.     Pupils: Pupils are equal, round, and reactive to light.  Cardiovascular:     Rate and Rhythm: Normal rate. Rhythm irregular.     Heart sounds: Murmur (3/6 diastolic murmur) heard.     No gallop.  Pulmonary:     Effort: Pulmonary effort is normal. No respiratory distress.     Breath sounds: Wheezing (Minimal diffuse expiratory wheezing heard throughout) present. No rhonchi or rales.  Abdominal:     General: Bowel sounds are normal.     Palpations: Abdomen is soft.  Musculoskeletal:     Right lower leg: 1+ Pitting  Edema present.     Left lower leg: 1+ Pitting Edema present.  Skin:    General: Skin is warm and dry.     Findings: Bruising (Diffuse bruising throughout upper and lower extremities in varying stages of healing) present.  Neurological:     Mental Status: He is alert.     Comments:  Patient is alert and oriented x 3 No dysarthria No facial asymmetry Complete left visual deficits noted although able to see some light Right visual field with no abnormalities 5 out of 5 strength in all 4 extremities No tremor  Psychiatric:        Mood and Affect: Mood normal.        Behavior: Behavior normal.        Thought Content: Thought content normal.        Judgment: Judgment normal.    Data Reviewed: CBC with WBC of 11.2, hemoglobin of 10.7, MCV of 95, platelets of 252 CMP with sodium of 137, potassium 4.0, chloride 96, bicarb 30, glucose 106, BUN 48, creatinine 1.36, calcium 10.5, albumin 3.9, AST 28, ALT 18, and GFR 49. Lipid panel with LDL of 91 and total cholesterol of 158 INR elevated at 1.5 PTT within normal limits at 32 ESR elevated at 41 Ethanol is negative UDS negative  EKG personally reviewed.  Atrial fibrillation with rate of 78.  Right bundle branch block noted.  Compared to EKG obtained in July 2022, no significant changes noted  CT HEAD WO CONTRAST  Result Date: 02/28/2022 CLINICAL DATA:  Neuro deficit, acute, stroke suspected. Left eye vision loss x 4 days. EXAM: CT HEAD WITHOUT CONTRAST TECHNIQUE: Contiguous axial images were obtained from the base of the skull through the vertex without intravenous contrast. RADIATION DOSE REDUCTION: This exam was performed according to the departmental dose-optimization program which includes automated exposure control, adjustment of the mA and/or kV according to patient size and/or use of iterative reconstruction technique. COMPARISON:  Head CT 06/25/2011 FINDINGS: Brain: There  is no evidence of an acute infarct, intracranial hemorrhage, mass,  midline shift, or extra-axial fluid collection. Patchy hypodensities in the cerebral white matter bilaterally have mildly progressed and are nonspecific but compatible with moderate chronic small vessel ischemic disease. There is mild cerebral atrophy. Vascular: Calcified atherosclerosis at the skull base. No hyperdense vessel. Skull: No acute fracture or suspicious osseous lesion. Sinuses/Orbits: No significant inflammatory changes in the included paranasal sinuses. Clear mastoid air cells. Bilateral cataract extraction. Other: None. IMPRESSION: 1. No evidence of acute intracranial abnormality. 2. Moderate chronic small vessel ischemic disease. Electronically Signed   By: Logan Bores M.D.   On: 02/28/2022 13:02    There are no new results to review at this time.  Assessment and Plan:  * Acute loss of vision Patient presenting with sudden onset left vision loss concerning for CVA.  No return of vision since onset approximately 4 days ago.  Multiple risk factors for CVA including hypertension, hyperlipidemia, atrial fibrillation although on anticoagulation.  - Neurology consulted; appreciate their recommendations - MRI brain pending - MRA head/neck pending - No telemetry monitoring given patient has known permanent A-fib - Out of window for permissive hypertension. Gradually normalize blood pressure - A1C pending - ASA 81 mg daily - Hold home Eliquis until neurology evaluates or MRI results return - Increase home statin to 40 mg daily due to LDL above goal - PT/OT/SLP  Chronic heart failure with preserved ejection fraction (HFpEF) ([place]) Patient appears euvolemic on examination.  -Continue home torsemide  COPD (chronic obstructive pulmonary disease) ([place]) Patient has mild expiratory wheezing throughout, however he states his breathing is about at his baseline.  - Continue home supplemental oxygen - Start Breo and Incruse to replace home Trelegy - Albuterol nebs every 6 hours as  needed  Atrial fibrillation, chronic (HCC) - Holding home Eliquis until MRI results or neurology evaluates - Restart home diltiazem and metoprolol  Hypertension Patient is out of the window for permissive hypertension.  - Restart home diltiazem and metoprolol  Advance Care Planning:   Code Status: DNR, however patient would want pulmonary resuscitation in the case of respiratory arrest.   Consults: Neurology  Family Communication: [name] niece updated at bedside  Severity of Illness: The appropriate patient status for this patient is OBSERVATION. Observation status is judged to be reasonable and necessary in order to provide the required intensity of service to ensure the patient's safety. The patient's presenting symptoms, physical exam findings, and initial radiographic and laboratory data in the context of their medical condition is felt to place them at decreased risk for further clinical deterioration. Furthermore, it is anticipated that the patient will be medically stable for discharge from the hospital within 2 midnights of admission.   Author: Jose Persia, MD 02/28/2022 7:22 PM  For on call review [email or web address].

## 2022-02-28 NOTE — Assessment & Plan Note (Signed)
Patient is out of the window for permissive hypertension.  - Restart home diltiazem and metoprolol

## 2022-02-28 NOTE — Assessment & Plan Note (Signed)
-  Holding home Eliquis until MRI results or neurology evaluates - Restart home diltiazem and metoprolol

## 2022-02-28 NOTE — ED Notes (Addendum)
Assumed care from [facility]. Pt resting comfortably in bed at this time. Pt denies any current needs or questions. Call light with in reach. Pt requested blood pressure cuff to be removed unless taking vital signs.

## 2022-02-28 NOTE — Assessment & Plan Note (Signed)
Patient appears euvolemic on examination.  -Continue home torsemide

## 2022-03-01 ENCOUNTER — Encounter: Payer: Self-pay | Admitting: Internal Medicine

## 2022-03-01 ENCOUNTER — Observation Stay (HOSPITAL_BASED_OUTPATIENT_CLINIC_OR_DEPARTMENT_OTHER)
Admit: 2022-03-01 | Discharge: 2022-03-01 | Disposition: A | Discharge status: Home / Self Care | Payer: Medicare Other | Attending: Student | Admitting: Student

## 2022-03-01 DIAGNOSIS — H3412 Central retinal artery occlusion, left eye: Secondary | ICD-10-CM

## 2022-03-01 DIAGNOSIS — I6521 Occlusion and stenosis of right carotid artery: Secondary | ICD-10-CM

## 2022-03-01 DIAGNOSIS — I350 Nonrheumatic aortic (valve) stenosis: Secondary | ICD-10-CM

## 2022-03-01 DIAGNOSIS — H53132 Sudden visual loss, left eye: Secondary | ICD-10-CM | POA: Diagnosis not present

## 2022-03-01 HISTORY — DX: Nonrheumatic aortic (valve) stenosis: I35.0

## 2022-03-01 LAB — CBC WITH DIFFERENTIAL/PLATELET
Abs Immature Granulocytes: 0.07 10*3/uL (ref 0.00–0.07)
Basophils Absolute: 0.1 10*3/uL (ref 0.0–0.1)
Basophils Relative: 1 %
Eosinophils Absolute: 0.2 10*3/uL (ref 0.0–0.5)
Eosinophils Relative: 2 %
HCT: 32.9 % — ABNORMAL LOW (ref 39.0–52.0)
Hemoglobin: 10.7 g/dL — ABNORMAL LOW (ref 13.0–17.0)
Immature Granulocytes: 1 %
Lymphocytes Relative: 17 %
Lymphs Abs: 2 10*3/uL (ref 0.7–4.0)
MCH: 30.9 pg (ref 26.0–34.0)
MCHC: 32.5 g/dL (ref 30.0–36.0)
MCV: 95.1 fL (ref 80.0–100.0)
Monocytes Absolute: 1.5 10*3/uL — ABNORMAL HIGH (ref 0.1–1.0)
Monocytes Relative: 14 %
Neutro Abs: 7.6 10*3/uL (ref 1.7–7.7)
Neutrophils Relative %: 65 %
Platelets: 238 10*3/uL (ref 150–400)
RBC: 3.46 MIL/uL — ABNORMAL LOW (ref 4.22–5.81)
RDW: 14.3 % (ref 11.5–15.5)
WBC: 11.4 10*3/uL — ABNORMAL HIGH (ref 4.0–10.5)
nRBC: 0 % (ref 0.0–0.2)

## 2022-03-01 LAB — BASIC METABOLIC PANEL
Anion gap: 8 (ref 5–15)
BUN: 41 mg/dL — ABNORMAL HIGH (ref 8–23)
CO2: 34 mmol/L — ABNORMAL HIGH (ref 22–32)
Calcium: 9.6 mg/dL (ref 8.9–10.3)
Chloride: 98 mmol/L (ref 98–111)
Creatinine, Ser: 1.3 mg/dL — ABNORMAL HIGH (ref 0.61–1.24)
GFR, Estimated: 52 mL/min — ABNORMAL LOW (ref >60)
Glucose, Bld: 86 mg/dL (ref 70–99)
Potassium: 3.6 mmol/L (ref 3.5–5.1)
Sodium: 140 mmol/L (ref 135–145)

## 2022-03-01 LAB — ECHOCARDIOGRAM COMPLETE
AR max vel: 0.84 cm2
AV Area VTI: 0.86 cm2
AV Area mean vel: 0.83 cm2
AV Mean grad: 25.5 mmHg
AV Peak grad: 52.9 mmHg
Ao pk vel: 3.64 m/s
Area-P 1/2: 3.81 cm2
Height: 72 in
MV VTI: 2.15 cm2
P 1/2 time: 428 msec
S' Lateral: 2.3 cm
Weight: 2289.26 oz

## 2022-03-01 LAB — MAGNESIUM: Magnesium: 2 mg/dL (ref 1.7–2.4)

## 2022-03-01 LAB — TSH: TSH: 3.099 u[IU]/mL (ref 0.350–4.500)

## 2022-03-01 MED ORDER — ATORVASTATIN CALCIUM 20 MG PO TABS: 40.0000 mg | ORAL_TABLET | Freq: Every day | ORAL | Status: DC

## 2022-03-01 MED ORDER — ATORVASTATIN CALCIUM 40 MG PO TABS: 40.0000 mg | ORAL_TABLET | Freq: Every day | ORAL | 2 refills | Status: DC

## 2022-03-01 MED ORDER — APIXABAN 2.5 MG PO TABS
2.5000 mg | ORAL_TABLET | Freq: Two times a day (BID) | ORAL | Status: DC
  Administered 2022-03-01: 2.5 mg via ORAL
  Filled 2022-03-01: qty 1

## 2022-03-01 NOTE — Progress Notes (Signed)
[facility] Progress Note  Patient: [name]    KDT:[id number]  DOA: 02/28/2022     Date of Service: the patient was seen and examined on 03/01/2022  Chief Complaint  Patient presents with   Eye Problem   Brief hospital course: [name] is a 89 y.o. male with medical history significant of HFpEF with last EF of 55-60%, chronic hypoxic respiratory failure on 3 L, paroxysmal A-fib, coronary artery disease, COPD, hypertension, hyperlipidemia, who presents to the ED with complaints of vision loss in his left eye.  [name] states he was in his usual state of health on 02/24/2022 when he was sitting on his recliner.  He was trying to bend over to see something out of the window and when he quickly sat up, he stated "my vision just flickered out."  He states he lost complete vision throughout the left eye, but is still able to see some bright lights from the lateral upper aspect.  He denies any prior symptoms such as these.  He denies any dizziness, headache, focal weakness, nausea, vomiting, chest pain or palpitations.  He states that he has chronic shortness of breath and this seems about the same.   ED course: On arrival to the ED, patient was hypertensive at 158/70 with heart rate of 65.  He was saturating at 100% on home 3 L.  Initial workup remarkable for WBC of 11.2, hemoglobin of 10.7, glucose 106, BUN 48, creatinine 1.36, calcium 10.5, and GFR 49.  INR elevated at 1.5.  ESR elevated at 41.  CT head was obtained with no acute intracranial abnormalities. Neurology was consulted; they recommended admission for evaluation of CRAO.  [facility] contacted for admission.   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  No return of vision since onset on Sunday 02/23/22.   CT head negative for any acute stroke findings MRI brain and MRA negative for acute CVA,  no large vessel occlusion.  Right ICA 90% stenosis LDL 91 slightly elevated, goal <70, A1c 6.3 Follow TSH level Continue aspirin 81 mg p.o. daily, resumed Eliquis 2.5 mg p.o. twice daily home dose, Increase Lipitor from 20 to 40 mg p.o. daily Continue to monitor on telemetry, continue neurochecks as per protocol Continue fall precautions PT and OT eval done, recommended no therapy needs Neurology consulted, awaiting for further recommendation. Follow 2D echocardiogram to rule out intracardiac thromboembolic source.    # Chronic heart failure with preserved ejection fraction (HFpEF) Patient appears euvolemic on examination. -Continue home torsemide   COPD (chronic obstructive pulmonary disease)  Continue home supplemental oxygen Start Breo and Incruse to replace home Trelegy Albuterol nebs every 6 hours as needed   Atrial fibrillation, chronic, controlled VR Restart home diltiazem and metoprolol Resumed Eliquis 2.5 mg p.o. BID home dose   Hypertension Patient is out of the window for permissive hypertension. - Restarted home diltiazem and metoprolol   Body mass index is 19.4 kg/m.  Interventions:       Diet: Regular diet DVT Prophylaxis: Therapeutic Anticoagulation with Eliquis    Advance goals of care discussion: DNR  Family Communication: family was present at bedside, at the time of interview.  The pt provided permission to discuss medical plan with the family. Opportunity was given to ask question and all questions were answered satisfactorily.  2/2 d/w pt's daughter over the phone  Disposition:  Pt is from Home, admitted with CVA, still has loss of vision in the left eye, 2D echocardiogram is pending, which precludes a safe discharge. Discharge to Home, when cleared by neurology, may need to stay overnight for 2D echocardiogram..  Subjective: No significant events overnight, patient still has vision loss in the left eye, does not feel much improvement.  No new  neurological changes, denies any chest pain or palpitation, no shortness of breath.  No new focal deficits.  Physical Exam: General: NAD, lying comfortably Appear in no distress, affect appropriate Eyes: PERRLA, loss of vision in the left eye ENT: Oral Mucosa Clear, moist  Neck: no JVD,  Cardiovascular: S1 and S2 Present, no Murmur,  Respiratory: Good respiratory effort, Bilateral Air entry equal and Decreased, no Crackles, no wheezes Abdomen: Bowel Sound present, Soft and no tenderness,  Skin: no rashes Extremities: no Pedal edema, no calf tenderness Neurologic: without any new focal findings Gait not checked due to patient safety concerns  Vitals:   02/28/22 2340 03/01/22 0606 03/01/22 1004 03/01/22 1216  BP: (!) 168/74 (!) 157/78 (!) 134/59 133/67  Pulse: 64 78 98 97  Resp: 18 (!) 21 (!) 22 20  Temp: 98 F (36.7 C) 97.7 F (36.5 C) 97.9 F (36.6 C) 98.3 F (36.8 C)  TempSrc: Oral Oral    SpO2: 98% 98% 95% 92%  Weight:      Height:        Intake/Output Summary (Last 24 hours) at 03/01/2022 1323 Last data filed at 03/01/2022 1018 Gross per 24 hour  Intake --  Output 2100 ml  Net -2100 ml   Filed Weights   02/28/22 1206  Weight: 64.9 kg    Data Reviewed: I have personally reviewed and interpreted daily labs, tele strips, imagings as discussed above. I reviewed all nursing notes, pharmacy notes, vitals, pertinent old records I have discussed plan of care as described above with RN and patient/family.  CBC: Recent Labs  Lab 02/28/22 1226 03/01/22 0605  WBC 11.2* 11.4*  NEUTROABS 7.5 7.6  HGB 10.7* 10.7*  HCT 33.2* 32.9*  MCV 95.7 95.1  PLT 252 630   Basic Metabolic Panel: Recent Labs  Lab 02/28/22 1226 03/01/22 0605  NA 137 140  K 4.0 3.6  CL 96* 98  CO2 30 34*  GLUCOSE 106* 86  BUN 48* 41*  CREATININE 1.36* 1.30*  CALCIUM 10.5* 9.6  MG  --  2.0    Studies: MR BRAIN WO CONTRAST  Result Date: 03/01/2022 CLINICAL DATA:  Acute neurologic deficit  EXAM: MRI HEAD WITHOUT CONTRAST MRA HEAD WITHOUT CONTRAST MRA NECK WITHOUT AND WITH CONTRAST TECHNIQUE: Multiplanar, multi-echo pulse sequences of the brain and surrounding structures were acquired without intravenous contrast. Angiographic images of the Circle of Willis were acquired using MRA technique without intravenous contrast. Angiographic images of the neck were acquired using MRA technique without and with intravenous contrast. Carotid stenosis measurements (when applicable) are obtained utilizing NASCET criteria, using the distal internal carotid diameter as the denominator. CONTRAST:  85m GADAVIST GADOBUTROL 1 MMOL/ML IV SOLN COMPARISON:  None Available. FINDINGS: MRI HEAD FINDINGS Brain: No acute infarct, mass effect or extra-axial collection. Single left occipital chronic microhemorrhage. There is multifocal hyperintense T2-weighted signal within the white matter. Generalized volume loss. The midline structures are normal. Vascular: Normal flow voids. Skull and upper cervical spine: Normal marrow signal. Sinuses/Orbits: No acute or significant finding. Other: None. MRA HEAD FINDINGS POSTERIOR CIRCULATION: --Vertebral arteries: Normal --Inferior cerebellar arteries: Normal. --Basilar artery: Normal. --  Superior cerebellar arteries: Normal. --Posterior cerebral arteries: Normal. ANTERIOR CIRCULATION: --Intracranial internal carotid arteries: Normal. --Anterior cerebral arteries (ACA): Normal. --Middle cerebral arteries (MCA): Normal. MRA NECK FINDINGS Aortic arch: Normal branching pattern.  Unremarkable. Right carotid system: Severe stenosis at the carotid bifurcation extending into the ICA with angiographic string sign. Stenosis is approximately 90%. Left carotid system: Atherosclerosis at the left carotid bifurcation without hemodynamically significant stenosis. Vertebral arteries: Normal Other: None IMPRESSION: 1. No acute intracranial abnormality. 2. No emergent large vessel occlusion. 3. Severe  stenosis of the right internal carotid artery at the carotid bifurcation extending into the ICA with angiographic string sign. Stenosis is approximately 90%. Electronically Signed   By: Ulyses Jarred M.D.   On: 03/01/2022 00:23   MR ANGIO HEAD WO CONTRAST  Result Date: 03/01/2022 CLINICAL DATA:  Acute neurologic deficit EXAM: MRI HEAD WITHOUT CONTRAST MRA HEAD WITHOUT CONTRAST MRA NECK WITHOUT AND WITH CONTRAST TECHNIQUE: Multiplanar, multi-echo pulse sequences of the brain and surrounding structures were acquired without intravenous contrast. Angiographic images of the Circle of Willis were acquired using MRA technique without intravenous contrast. Angiographic images of the neck were acquired using MRA technique without and with intravenous contrast. Carotid stenosis measurements (when applicable) are obtained utilizing NASCET criteria, using the distal internal carotid diameter as the denominator. CONTRAST:  36m GADAVIST GADOBUTROL 1 MMOL/ML IV SOLN COMPARISON:  None Available. FINDINGS: MRI HEAD FINDINGS Brain: No acute infarct, mass effect or extra-axial collection. Single left occipital chronic microhemorrhage. There is multifocal hyperintense T2-weighted signal within the white matter. Generalized volume loss. The midline structures are normal. Vascular: Normal flow voids. Skull and upper cervical spine: Normal marrow signal. Sinuses/Orbits: No acute or significant finding. Other: None. MRA HEAD FINDINGS POSTERIOR CIRCULATION: --Vertebral arteries: Normal --Inferior cerebellar arteries: Normal. --Basilar artery: Normal. --Superior cerebellar arteries: Normal. --Posterior cerebral arteries: Normal. ANTERIOR CIRCULATION: --Intracranial internal carotid arteries: Normal. --Anterior cerebral arteries (ACA): Normal. --Middle cerebral arteries (MCA): Normal. MRA NECK FINDINGS Aortic arch: Normal branching pattern.  Unremarkable. Right carotid system: Severe stenosis at the carotid bifurcation extending into the  ICA with angiographic string sign. Stenosis is approximately 90%. Left carotid system: Atherosclerosis at the left carotid bifurcation without hemodynamically significant stenosis. Vertebral arteries: Normal Other: None IMPRESSION: 1. No acute intracranial abnormality. 2. No emergent large vessel occlusion. 3. Severe stenosis of the right internal carotid artery at the carotid bifurcation extending into the ICA with angiographic string sign. Stenosis is approximately 90%. Electronically Signed   By: KUlyses JarredM.D.   On: 03/01/2022 00:23   MR ANGIO NECK W WO CONTRAST  Result Date: 03/01/2022 CLINICAL DATA:  Acute neurologic deficit EXAM: MRI HEAD WITHOUT CONTRAST MRA HEAD WITHOUT CONTRAST MRA NECK WITHOUT AND WITH CONTRAST TECHNIQUE: Multiplanar, multi-echo pulse sequences of the brain and surrounding structures were acquired without intravenous contrast. Angiographic images of the Circle of Willis were acquired using MRA technique without intravenous contrast. Angiographic images of the neck were acquired using MRA technique without and with intravenous contrast. Carotid stenosis measurements (when applicable) are obtained utilizing NASCET criteria, using the distal internal carotid diameter as the denominator. CONTRAST:  629mGADAVIST GADOBUTROL 1 MMOL/ML IV SOLN COMPARISON:  None Available. FINDINGS: MRI HEAD FINDINGS Brain: No acute infarct, mass effect or extra-axial collection. Single left occipital chronic microhemorrhage. There is multifocal hyperintense T2-weighted signal within the white matter. Generalized volume loss. The midline structures are normal. Vascular: Normal flow voids. Skull and upper cervical spine: Normal marrow signal. Sinuses/Orbits: No acute or significant finding. Other: None.  MRA HEAD FINDINGS POSTERIOR CIRCULATION: --Vertebral arteries: Normal --Inferior cerebellar arteries: Normal. --Basilar artery: Normal. --Superior cerebellar arteries: Normal. --Posterior cerebral arteries:  Normal. ANTERIOR CIRCULATION: --Intracranial internal carotid arteries: Normal. --Anterior cerebral arteries (ACA): Normal. --Middle cerebral arteries (MCA): Normal. MRA NECK FINDINGS Aortic arch: Normal branching pattern.  Unremarkable. Right carotid system: Severe stenosis at the carotid bifurcation extending into the ICA with angiographic string sign. Stenosis is approximately 90%. Left carotid system: Atherosclerosis at the left carotid bifurcation without hemodynamically significant stenosis. Vertebral arteries: Normal Other: None IMPRESSION: 1. No acute intracranial abnormality. 2. No emergent large vessel occlusion. 3. Severe stenosis of the right internal carotid artery at the carotid bifurcation extending into the ICA with angiographic string sign. Stenosis is approximately 90%. Electronically Signed   By: Ulyses Jarred M.D.   On: 03/01/2022 00:23    Scheduled Meds:  apixaban  2.5 mg Oral BID   aspirin EC  81 mg Oral Q0600   atorvastatin  20 mg Oral Daily   diltiazem  300 mg Oral Daily   fluticasone furoate-vilanterol  1 puff Inhalation Daily   And   umeclidinium bromide  1 puff Inhalation Daily   metoprolol tartrate  50 mg Oral BID   potassium chloride  20 mEq Oral Daily   predniSONE  5 mg Oral Q breakfast   torsemide  40 mg Oral Once per day on Mon Fri   And   torsemide  20 mg Oral Once per day on Sun Tue Wed Thu Sat   Continuous Infusions: PRN Meds: acetaminophen **OR** acetaminophen (TYLENOL) oral liquid 160 mg/5 mL **OR** acetaminophen, albuterol, senna-docusate  Time spent: 35 minutes  Author: Val Riles. MD [facility] 03/01/2022 1:23 PM  To reach On-call, see care teams to locate the attending and reach out to them via [email or web address]. If 7PM-7AM, please contact night-coverage If you still have difficulty reaching the attending provider, please page the [facility] (Director on Call) for [facility] on amion for assistance.

## 2022-03-01 NOTE — Progress Notes (Incomplete)
[phone] 

## 2022-03-01 NOTE — ED Notes (Signed)
MD Dwyane Dee at bedside.

## 2022-03-01 NOTE — ED Notes (Signed)
Pt daughter called for update and to be told MRI results. RN advised that the dr would be notified to contact pt family. MD Dwyane Dee notified.

## 2022-03-01 NOTE — Discharge Summary (Signed)
[facility] Discharge Summary   Patient: [name] BTD:[id number]  PCP: Christopher Hire, MD  Date of admission: 02/28/2022   Date of discharge:  03/01/2022     Discharge Diagnoses:  Principal Problem:   Acute loss of vision Active Problems:   Hypertension   Atrial fibrillation, chronic (HCC)   COPD (chronic obstructive pulmonary disease) ([place])   Chronic heart failure with preserved ejection fraction (HFpEF) ([place])   Admitted From: Home Disposition:  Home with HHPT but pt declined  Recommendations for Outpatient Follow-up:  Follow-up with PCP in 1 to 2 weeks Follow-up with vascular surgery for right ICA 90% stenosis in few weeks. Follow-up with neurology in 1 to 2 weeks Follow-up with ophthalmology in 1 to 2 weeks Follow with pulmonary in 1 to 2 months for pulm hypertension Follow up LABS/TEST:     Diet recommendation: Cardiac diet  Activity: The patient is advised to gradually reintroduce usual activities, as tolerated  Discharge Condition: stable  Code Status: DNR   History of present illness: As per the H and P dictated on admission  Hospital Course:  [name] is a 89 y.o. male with medical history significant of HFpEF with last EF of 55-60%, chronic hypoxic respiratory failure on 3 L, paroxysmal A-fib, coronary artery disease, COPD, hypertension, hyperlipidemia, who presents to the ED with complaints of vision loss in his left eye.  [name] states he was in his usual state of health on 02/24/2022 when he was sitting on his recliner.  He was trying to bend over to see something out of the window and when he quickly sat up, he stated "my vision just flickered out."  He states he lost complete vision throughout the left eye, but is still able to see some bright lights from the lateral upper aspect.  He denies any prior symptoms such as these.  He denies any dizziness, headache, focal weakness, nausea, vomiting, chest pain or palpitations.  He states  that he has chronic shortness of breath and this seems about the same. ED course: On arrival to the ED, patient was hypertensive at 158/70 with heart rate of 65.  He was saturating at 100% on home 3 L.  Initial workup remarkable for WBC of 11.2, hemoglobin of 10.7, glucose 106, BUN 48, creatinine 1.36, calcium 10.5, and GFR 49.  INR elevated at 1.5.  ESR elevated at 41.  CT head was obtained with no acute intracranial abnormalities. Neurology was consulted; they recommended admission for evaluation of CRAO.  [facility] contacted for admission.  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  No return of vision since onset on Sunday 02/23/22.  CT head negative for any acute stroke findings MRI brain and MRA negative for acute CVA, no large vessel occlusion.  Right ICA 90% stenosis. LDL 91 slightly elevated, goal <70, A1c 6.3. TSH level 3.1 wnl Continue aspirin 81 mg p.o. daily, resumed Eliquis 2.5 mg p.o. twice daily home dose, Increase Lipitor from 20 to 40 mg p.o. daily. PT and OT eval done, recommended [facility] PT/OT but patient declined.  TTE shows LVEF 6065%, mild LV hypertrophy, RV systolic function mildly reduced, RV moderately enlarged, severe pulmonary hypertension, LA moderately dilated, moderate aortic valve stenosis.  Negative PFO. Neurology consulted, recommended to follow-up as an outpatient, cleared for discharge. # Carotid stenosis:  MRA shows right ICA 90% stenosis.  Patient  was recommended to follow-up with vascular surgery [name].  Patient follows with him as an outpatient # Chronic heart failure with preserved ejection fraction (HFpEF) Patient appears euvolemic on examination. continue home torsemide # COPD (chronic obstructive pulmonary disease) Continue home supplemental oxygen Continue Trelegy Ellipta home inhaler. # Atrial fibrillation, chronic, controlled VR,  Resumed home diltiazem and metoprolol Resumed Eliquis 2.5 mg p.o. BID home dose # Hypertension, Patient is out of the window for permissive hypertension. Restarted home diltiazem and metoprolol # Severe pulmonary hypertension, patient was recommended to follow with the pulmonologist as an outpatient.  Body mass index is 19.4 kg/m.  Interventions:   Patient was seen by physical therapy, who recommended Home health, which patient declined. On the day of the discharge the patient's vitals were stable, and no other acute medical condition were reported by patient. the patient was felt safe to be discharge at Home .  Consultants: Neurology Procedures: None  Discharge Exam: General: Appear in no distress, no Rash; Oral Mucosa Clear, moist. EYES: No vision in the left eye, right eye vision is normal Cardiovascular: Irregular rhythm, audible murmur, Respiratory: normal respiratory effort, Bilateral Air entry present and no Crackles, no wheezes Abdomen: Bowel Sound present, Soft and no tenderness, no hernia Extremities: no Pedal edema, no calf tenderness Neurology: alert and oriented to time, place, and person affect appropriate.  Filed Weights   02/28/22 1206  Weight: 64.9 kg   Vitals:   03/01/22 1004 03/01/22 1216  BP: (!) 134/59 133/67  Pulse: 98 97  Resp: (!) 22 20  Temp: 97.9 F (36.6 C) 98.3 F (36.8 C)  SpO2: 95% 92%    DISCHARGE MEDICATION: Allergies as of 03/01/2022       Reactions   Bee Venom Anaphylaxis        Medication List     STOP taking these medications    albuterol (2.5 MG/3ML) 0.083% nebulizer solution Commonly known as: PROVENTIL   metolazone 2.5 MG tablet Commonly known as: ZAROXOLYN   predniSONE 10 MG tablet Commonly known as: DELTASONE   Probiotic Gummies 30 MG Chew   senna-docusate 8.6-50 MG tablet Commonly known as: Senokot-S   Zinc Oxide 40 % Pste       TAKE these medications    acetaminophen 500 MG tablet Commonly known as:  TYLENOL Take 500 mg by mouth every 6 (six) hours as needed for fever or pain.   alendronate 70 MG tablet Commonly known as: FOSAMAX Take 70 mg by mouth once a week. Sunday morning with full glass of water and sit up do not lay down   apixaban 2.5 MG Tabs tablet Commonly known as: ELIQUIS Take 2.5 mg by mouth 2 (two) times daily.   ascorbic acid 250 MG tablet Commonly known as: VITAMIN C Take 1 tablet (250 mg total) by mouth 2 (two) times daily.   aspirin EC 81 MG tablet Take 1 tablet (81 mg total) by mouth daily at 6 (six) AM. Swallow whole.   atorvastatin 40 MG tablet Commonly known as: LIPITOR Take 1 tablet (40 mg total) by mouth daily. What changed:  medication strength how much to take   BENEFIBER PO Take by mouth. 2 teaspoons twice a day   CALCIUM 600/VITAMIN D PO Take by mouth.   Cartia XT 300 MG 24 hr capsule Generic drug: diltiazem Take 300 mg by mouth daily.   ciclopirox 0.77 % cream Commonly known as: LOPROX Apply topically 2 (two) times daily. Apply to feet BID x 1 month.  Then QW thereafter.   ciclopirox 0.77 % Susp Commonly known as: LOPROX Apply once daily to affected fingernails   metoprolol tartrate 50 MG tablet Commonly known as: LOPRESSOR Take 50 mg by mouth 2 (two) times daily.   multivitamin with minerals Tabs tablet Take 1 tablet by mouth daily.   mupirocin ointment 2 % Commonly known as: BACTROBAN Apply 1 Application topically daily. Apply to wounds once daily until healed.   potassium chloride 10 MEQ tablet Commonly known as: KLOR-CON M Take 2 tablets (20 mEq total) by mouth daily. What changed: how much to take   torsemide 20 MG tablet Commonly known as: DEMADEX Take '40mg'$  on M, W, F and '20mg'$  on Tu, Thur,Sat & 'Sunday What changed:  how much to take how to take this when to take this additional instructions   Trelegy Ellipta 100-62.5-25 MCG/ACT Aepb Generic drug: Fluticasone-Umeclidin-Vilant Inhale into the lungs.   Vitamin  D (Ergocalciferol) 1.25 MG (50000 UNIT) Caps capsule Commonly known as: DRISDOL Take 1 capsule (50,000 Units total) by mouth every 7 (seven) days.       Allergies  Allergen Reactions   Bee Venom Anaphylaxis   Discharge Instructions     Call MD for:   Complete by: As directed    Worsening of vision, any new neurological changes.   Call MD for:  difficulty breathing, headache or visual disturbances   Complete by: As directed    Call MD for:  extreme fatigue   Complete by: As directed    Call MD for:  persistant dizziness or light-headedness   Complete by: As directed    Call MD for:  persistant nausea and vomiting   Complete by: As directed    Call MD for:  severe uncontrolled pain   Complete by: As directed    Call MD for:  temperature >100.4   Complete by: As directed    Diet - low sodium heart healthy   Complete by: As directed    Discharge instructions   Complete by: As directed    Follow-up with PCP in 1 to 2 weeks Follow-up with vascular surgery for right ICA 90% stenosis in few weeks. Follow-up with neurology in 1 to 2 weeks Follow-up with ophthalmology in 1 to 2 weeks Follow with pulmonary in 1 to 2 months for pulm hypertension   Increase activity slowly   Complete by: As directed        The results of significant diagnostics from this hospitalization (including imaging, microbiology, ancillary and laboratory) are listed below for reference.    Significant Diagnostic Studies: ECHOCARDIOGRAM COMPLETE  Result Date: 03/01/2022    ECHOCARDIOGRAM REPORT   Patient Name:   Jaydrian L Caporale Date of Exam: 03/01/2022 Medical Rec #:  [id number]         Height:       72.0 in Accession #:    [id number]        Weight:       143.1 lb Date of Birth:  08/15/1928         BSA:          1.848 m Patient Age:    89 years          BP:           133/67 mmHg Patient Gender: M                 HR:           87'$  bpm. Exam Location:  [facility]  Procedure: 2D Echo, Cardiac Doppler and Color Doppler  Indications:     Stroke  History:         Patient has prior history of Echocardiogram examinations, most                  recent 10/10/2019. CHF, CAD, COPD, Arrythmias:Atrial                  Fibrillation; Risk Factors:Hypertension and Dyslipidemia.  Sonographer:     Wenda Low Referring Phys:  [id number] Val Riles Diagnosing Phys: Ida Rogue MD IMPRESSIONS  1. Left ventricular ejection fraction, by estimation, is 60 to 65%. The left ventricle has normal function. The left ventricle has no regional wall motion abnormalities. There is mild left ventricular hypertrophy. Left ventricular diastolic parameters are indeterminate.  2. Right ventricular systolic function is mildly reduced. The right ventricular size is moderately enlarged. There is severely elevated pulmonary artery systolic pressure. The estimated right ventricular systolic pressure is 06.3 mmHg.  3. Left atrial size was moderately dilated.  4. Right atrial size was severely dilated.  5. The mitral valve is normal in structure. Mild to moderate mitral valve regurgitation. No evidence of mitral stenosis.  6. Tricuspid valve regurgitation is moderate.  7. The aortic valve is normal in structure. There is moderate calcification of the aortic valve. Aortic valve regurgitation is mild. Moderate aortic valve stenosis. Aortic valve area, by VTI measures 0.86 cm. Aortic valve mean gradient measures 25.5 mmHg. Aortic valve Vmax measures 3.64 m/s.  8. The inferior vena cava is normal in size with greater than 50% respiratory variability, suggesting right atrial pressure of 3 mmHg. FINDINGS  Left Ventricle: Left ventricular ejection fraction, by estimation, is 60 to 65%. The left ventricle has normal function. The left ventricle has no regional wall motion abnormalities. The left ventricular internal cavity size was normal in size. There is  mild left ventricular hypertrophy. Left ventricular diastolic parameters are indeterminate. Right Ventricle: The  right ventricular size is moderately enlarged. No increase in right ventricular wall thickness. Right ventricular systolic function is mildly reduced. There is severely elevated pulmonary artery systolic pressure. The tricuspid regurgitant velocity is 3.96 m/s, and with an assumed right atrial pressure of 15 mmHg, the estimated right ventricular systolic pressure is 01.6 mmHg. Left Atrium: Left atrial size was moderately dilated. Right Atrium: Right atrial size was severely dilated. Pericardium: There is no evidence of pericardial effusion. Mitral Valve: The mitral valve is normal in structure. Mild to moderate mitral valve regurgitation. No evidence of mitral valve stenosis. MV peak gradient, 6.0 mmHg. The mean mitral valve gradient is 1.0 mmHg. Tricuspid Valve: The tricuspid valve is normal in structure. Tricuspid valve regurgitation is moderate . No evidence of tricuspid stenosis. Aortic Valve: The aortic valve is normal in structure. There is moderate calcification of the aortic valve. Aortic valve regurgitation is mild. Aortic regurgitation PHT measures 428 msec. Moderate aortic stenosis is present. Aortic valve mean gradient measures 25.5 mmHg. Aortic valve peak gradient measures 52.9 mmHg. Aortic valve area, by VTI measures 0.86 cm. Pulmonic Valve: The pulmonic valve was normal in structure. Pulmonic valve regurgitation is not visualized. No evidence of pulmonic stenosis. Aorta: The aortic root is normal in size and structure. Venous: The inferior vena cava is normal in size with greater than 50% respiratory variability, suggesting right atrial pressure of 3 mmHg. IAS/Shunts: No atrial level shunt detected by color flow Doppler.  LEFT VENTRICLE PLAX 2D LVIDd:  3.70 cm LVIDs:         2.30 cm LV PW:         1.60 cm LV IVS:        1.30 cm LVOT diam:     2.00 cm LV SV:         64 LV SV Index:   35 LVOT Area:     3.14 cm  RIGHT VENTRICLE RV Basal diam:  5.40 cm RV Mid diam:    4.30 cm RV S prime:      14.90 cm/s LEFT ATRIUM             Index        RIGHT ATRIUM           Index LA diam:        5.20 cm 2.81 cm/m   RA Area:     30.50 cm LA Vol (A2C):   95.3 ml 51.57 ml/m  RA Volume:   120.00 ml 64.94 ml/m LA Vol (A4C):   90.8 ml 49.14 ml/m LA Biplane Vol: 96.2 ml 52.06 ml/m  AORTIC VALVE                     PULMONIC VALVE AV Area (Vmax):    0.84 cm      PV Vmax:       1.48 m/s AV Area (Vmean):   0.83 cm      PV Peak grad:  8.8 mmHg AV Area (VTI):     0.86 cm AV Vmax:           363.50 cm/s AV Vmean:          234.000 cm/s AV VTI:            0.745 m AV Peak Grad:      52.9 mmHg AV Mean Grad:      25.5 mmHg LVOT Vmax:         97.00 cm/s LVOT Vmean:        62.150 cm/s LVOT VTI:          0.204 m LVOT/AV VTI ratio: 0.27 AI PHT:            428 msec  AORTA Ao Root diam: 3.10 cm Ao Asc diam:  3.10 cm MITRAL VALVE               TRICUSPID VALVE MV Area (PHT): 3.81 cm    TR Peak grad:   62.7 mmHg MV Area VTI:   2.15 cm    TR Vmax:        396.00 cm/s MV Peak grad:  6.0 mmHg MV Mean grad:  1.0 mmHg    SHUNTS MV Vmax:       1.22 m/s    Systemic VTI:  0.20 m MV Vmean:      42.6 cm/s   Systemic Diam: 2.00 cm MV Decel Time: 199 msec MV E velocity: 93.30 cm/s Ida Rogue MD Electronically signed by Ida Rogue MD Signature Date/Time: 03/01/2022/3:40:49 PM    Final    MR BRAIN WO CONTRAST  Result Date: 03/01/2022 CLINICAL DATA:  Acute neurologic deficit EXAM: MRI HEAD WITHOUT CONTRAST MRA HEAD WITHOUT CONTRAST MRA NECK WITHOUT AND WITH CONTRAST TECHNIQUE: Multiplanar, multi-echo pulse sequences of the brain and surrounding structures were acquired without intravenous contrast. Angiographic images of the Circle of Willis were acquired using MRA technique without intravenous contrast. Angiographic images of the neck were acquired using MRA technique without and with intravenous  contrast. Carotid stenosis measurements (when applicable) are obtained utilizing NASCET criteria, using the distal internal carotid diameter as  the denominator. CONTRAST:  70m GADAVIST GADOBUTROL 1 MMOL/ML IV SOLN COMPARISON:  None Available. FINDINGS: MRI HEAD FINDINGS Brain: No acute infarct, mass effect or extra-axial collection. Single left occipital chronic microhemorrhage. There is multifocal hyperintense T2-weighted signal within the white matter. Generalized volume loss. The midline structures are normal. Vascular: Normal flow voids. Skull and upper cervical spine: Normal marrow signal. Sinuses/Orbits: No acute or significant finding. Other: None. MRA HEAD FINDINGS POSTERIOR CIRCULATION: --Vertebral arteries: Normal --Inferior cerebellar arteries: Normal. --Basilar artery: Normal. --Superior cerebellar arteries: Normal. --Posterior cerebral arteries: Normal. ANTERIOR CIRCULATION: --Intracranial internal carotid arteries: Normal. --Anterior cerebral arteries (ACA): Normal. --Middle cerebral arteries (MCA): Normal. MRA NECK FINDINGS Aortic arch: Normal branching pattern.  Unremarkable. Right carotid system: Severe stenosis at the carotid bifurcation extending into the ICA with angiographic string sign. Stenosis is approximately 90%. Left carotid system: Atherosclerosis at the left carotid bifurcation without hemodynamically significant stenosis. Vertebral arteries: Normal Other: None IMPRESSION: 1. No acute intracranial abnormality. 2. No emergent large vessel occlusion. 3. Severe stenosis of the right internal carotid artery at the carotid bifurcation extending into the ICA with angiographic string sign. Stenosis is approximately 90%. Electronically Signed   By: KUlyses JarredM.D.   On: 03/01/2022 00:23   MR ANGIO HEAD WO CONTRAST  Result Date: 03/01/2022 CLINICAL DATA:  Acute neurologic deficit EXAM: MRI HEAD WITHOUT CONTRAST MRA HEAD WITHOUT CONTRAST MRA NECK WITHOUT AND WITH CONTRAST TECHNIQUE: Multiplanar, multi-echo pulse sequences of the brain and surrounding structures were acquired without intravenous contrast. Angiographic images of the  Circle of Willis were acquired using MRA technique without intravenous contrast. Angiographic images of the neck were acquired using MRA technique without and with intravenous contrast. Carotid stenosis measurements (when applicable) are obtained utilizing NASCET criteria, using the distal internal carotid diameter as the denominator. CONTRAST:  622mGADAVIST GADOBUTROL 1 MMOL/ML IV SOLN COMPARISON:  None Available. FINDINGS: MRI HEAD FINDINGS Brain: No acute infarct, mass effect or extra-axial collection. Single left occipital chronic microhemorrhage. There is multifocal hyperintense T2-weighted signal within the white matter. Generalized volume loss. The midline structures are normal. Vascular: Normal flow voids. Skull and upper cervical spine: Normal marrow signal. Sinuses/Orbits: No acute or significant finding. Other: None. MRA HEAD FINDINGS POSTERIOR CIRCULATION: --Vertebral arteries: Normal --Inferior cerebellar arteries: Normal. --Basilar artery: Normal. --Superior cerebellar arteries: Normal. --Posterior cerebral arteries: Normal. ANTERIOR CIRCULATION: --Intracranial internal carotid arteries: Normal. --Anterior cerebral arteries (ACA): Normal. --Middle cerebral arteries (MCA): Normal. MRA NECK FINDINGS Aortic arch: Normal branching pattern.  Unremarkable. Right carotid system: Severe stenosis at the carotid bifurcation extending into the ICA with angiographic string sign. Stenosis is approximately 90%. Left carotid system: Atherosclerosis at the left carotid bifurcation without hemodynamically significant stenosis. Vertebral arteries: Normal Other: None IMPRESSION: 1. No acute intracranial abnormality. 2. No emergent large vessel occlusion. 3. Severe stenosis of the right internal carotid artery at the carotid bifurcation extending into the ICA with angiographic string sign. Stenosis is approximately 90%. Electronically Signed   By: KeUlyses Jarred.D.   On: 03/01/2022 00:23   MR ANGIO NECK W WO  CONTRAST  Result Date: 03/01/2022 CLINICAL DATA:  Acute neurologic deficit EXAM: MRI HEAD WITHOUT CONTRAST MRA HEAD WITHOUT CONTRAST MRA NECK WITHOUT AND WITH CONTRAST TECHNIQUE: Multiplanar, multi-echo pulse sequences of the brain and surrounding structures were acquired without intravenous contrast. Angiographic images of the Circle of Willis were acquired using MRA technique without  intravenous contrast. Angiographic images of the neck were acquired using MRA technique without and with intravenous contrast. Carotid stenosis measurements (when applicable) are obtained utilizing NASCET criteria, using the distal internal carotid diameter as the denominator. CONTRAST:  63m GADAVIST GADOBUTROL 1 MMOL/ML IV SOLN COMPARISON:  None Available. FINDINGS: MRI HEAD FINDINGS Brain: No acute infarct, mass effect or extra-axial collection. Single left occipital chronic microhemorrhage. There is multifocal hyperintense T2-weighted signal within the white matter. Generalized volume loss. The midline structures are normal. Vascular: Normal flow voids. Skull and upper cervical spine: Normal marrow signal. Sinuses/Orbits: No acute or significant finding. Other: None. MRA HEAD FINDINGS POSTERIOR CIRCULATION: --Vertebral arteries: Normal --Inferior cerebellar arteries: Normal. --Basilar artery: Normal. --Superior cerebellar arteries: Normal. --Posterior cerebral arteries: Normal. ANTERIOR CIRCULATION: --Intracranial internal carotid arteries: Normal. --Anterior cerebral arteries (ACA): Normal. --Middle cerebral arteries (MCA): Normal. MRA NECK FINDINGS Aortic arch: Normal branching pattern.  Unremarkable. Right carotid system: Severe stenosis at the carotid bifurcation extending into the ICA with angiographic string sign. Stenosis is approximately 90%. Left carotid system: Atherosclerosis at the left carotid bifurcation without hemodynamically significant stenosis. Vertebral arteries: Normal Other: None IMPRESSION: 1. No acute  intracranial abnormality. 2. No emergent large vessel occlusion. 3. Severe stenosis of the right internal carotid artery at the carotid bifurcation extending into the ICA with angiographic string sign. Stenosis is approximately 90%. Electronically Signed   By: KUlyses JarredM.D.   On: 03/01/2022 00:23   CT HEAD WO CONTRAST  Result Date: 02/28/2022 CLINICAL DATA:  Neuro deficit, acute, stroke suspected. Left eye vision loss x 4 days. EXAM: CT HEAD WITHOUT CONTRAST TECHNIQUE: Contiguous axial images were obtained from the base of the skull through the vertex without intravenous contrast. RADIATION DOSE REDUCTION: This exam was performed according to the departmental dose-optimization program which includes automated exposure control, adjustment of the mA and/or kV according to patient size and/or use of iterative reconstruction technique. COMPARISON:  Head CT 06/25/2011 FINDINGS: Brain: There is no evidence of an acute infarct, intracranial hemorrhage, mass, midline shift, or extra-axial fluid collection. Patchy hypodensities in the cerebral white matter bilaterally have mildly progressed and are nonspecific but compatible with moderate chronic small vessel ischemic disease. There is mild cerebral atrophy. Vascular: Calcified atherosclerosis at the skull base. No hyperdense vessel. Skull: No acute fracture or suspicious osseous lesion. Sinuses/Orbits: No significant inflammatory changes in the included paranasal sinuses. Clear mastoid air cells. Bilateral cataract extraction. Other: None. IMPRESSION: 1. No evidence of acute intracranial abnormality. 2. Moderate chronic small vessel ischemic disease. Electronically Signed   By: ALogan BoresM.D.   On: 02/28/2022 13:02    Microbiology: No results found for this or any previous visit (from the past 240 hour(s)).   Labs: CBC: Recent Labs  Lab 02/28/22 1226 03/01/22 0605  WBC 11.2* 11.4*  NEUTROABS 7.5 7.6  HGB 10.7* 10.7*  HCT 33.2* 32.9*  MCV 95.7 95.1   PLT 252 2643  Basic Metabolic Panel: Recent Labs  Lab 02/28/22 1226 03/01/22 0605  NA 137 140  K 4.0 3.6  CL 96* 98  CO2 30 34*  GLUCOSE 106* 86  BUN 48* 41*  CREATININE 1.36* 1.30*  CALCIUM 10.5* 9.6  MG  --  2.0   Liver Function Tests: Recent Labs  Lab 02/28/22 1226  AST 28  ALT 18  ALKPHOS 57  BILITOT 0.9  PROT 7.3  ALBUMIN 3.9   No results for input(s): "LIPASE", "AMYLASE" in the last 168 hours. No results for input(s): "AMMONIA" in  the last 168 hours. Cardiac Enzymes: No results for input(s): "CKTOTAL", "CKMB", "CKMBINDEX", "TROPONINI" in the last 168 hours. BNP (last 3 results) No results for input(s): "BNP" in the last 8760 hours. CBG: No results for input(s): "GLUCAP" in the last 168 hours.  Time spent: 35 minutes  Signed:  Val Riles  [facility] 03/01/2022 4:04 PM

## 2022-03-01 NOTE — Progress Notes (Signed)
*  PRELIMINARY RESULTS* Echocardiogram 2D Echocardiogram has been performed.  [name] 03/01/2022, 3:28 PM

## 2022-03-01 NOTE — ED Notes (Signed)
SLP at bedside.

## 2022-03-01 NOTE — ED Notes (Signed)
PT at bedside.

## 2022-03-01 NOTE — Progress Notes (Signed)
SLP Cancellation Note  Patient Details Name: [name] MRN: [id number] DOB: 12/15/1928   Cancelled treatment:       Reason Eval/Treat Not Completed: SLP screened, no needs identified, will sign off (chart reviewed; consulted NSG then met w/ pt in room)  Pt denied any difficulty swallowing and is currently on a regular diet; tolerates swallowing pills w/ water per NSG. Pt was eating his breakfast meal w/ no overt difficulties noted. Pt conversed in conversation w/out expressive/receptive deficits noted; pt denied any speech-language deficits. Speech clear. He made his wants/needs known to this SLP who obliged.  No further skilled ST services indicated as pt appears at his baseline. Pt agreed. NSG to reconsult if any change in status while admitted.      Orinda Kenner, MS, CCC-SLP Speech Language Pathologist Rehab Services; [place] [phone] (ascom) [name] 03/01/2022, 9:07 AM

## 2022-03-01 NOTE — Evaluation (Signed)
Physical Therapy Evaluation Patient Details Name: [name] MRN: [id number] DOB: 1928-06-08 Today's Date: 03/01/2022  History of Present Illness  Pt is a 89 y.o. male with medical history significant of HFpEF with last EF of 55-60%, chronic hypoxic respiratory failure on 3 L, paroxysmal A-fib, coronary artery disease, COPD, hypertension, hyperlipidemia, who presents to the ED with complaints of vision loss to L eye.  Pt diagnosed with acute loss of vision with MRI impression stating: No acute intracranial abnormality, no emergent large vessel occlusion, and severe stenosis of the right internal carotid artery.   Clinical Impression  Pt was pleasant and motivated to participate during the session and put forth good effort throughout. Pt required min extra time and effort with bed mobility and transfers but was steady in standing with a RW for support.  Pt was able to amb 125' with a RW with slow cadence but again was steady with no overt LOB and was able to hold a conversation while walking.  Pt's SpO2 dropped to a low of 89% on 3L during ambulation but quickly returned to the mid 90s upon sitting and further increased to baseline of upper 90s after 30 sec of rest.  Pt typically ambulates without an AD and reported feeling weaker than baseline but declined HHPT recommendation having had HHPT in the past and still actively participates in his HEP.  Pt stated that he will reach out to medical staff if he reconsiders.          Recommendations for follow up therapy are one component of a multi-disciplinary discharge planning process, led by the attending physician.  Recommendations may be updated based on patient status, additional functional criteria and insurance authorization.  Follow Up Recommendations Home health PT (Pt declined HHPT)      Assistance Recommended at Discharge Intermittent Supervision/Assistance  Patient can return home with the following  A little help with walking and/or  transfers;A little help with bathing/dressing/bathroom;Help with stairs or ramp for entrance    Equipment Recommendations None recommended by PT  Recommendations for Other Services       Functional Status Assessment Patient has had a recent decline in their functional status and demonstrates the ability to make significant improvements in function in a reasonable and predictable amount of time.     Precautions / Restrictions Precautions Precautions: Fall Restrictions Weight Bearing Restrictions: No      Mobility  Bed Mobility Overal bed mobility: Modified Independent             General bed mobility comments: Min extra time and effort only    Transfers Overall transfer level: Needs assistance Equipment used: Rolling walker (2 wheels) Transfers: Sit to/from Stand Sit to Stand: Supervision           General transfer comment: Good control and stability with a RW for support upon standing    Ambulation/Gait Ambulation/Gait assistance: Supervision Gait Distance (Feet): 125 Feet Assistive device: Rolling walker (2 wheels) Gait Pattern/deviations: Step-through pattern, Decreased step length - right, Decreased step length - left, Trunk flexed Gait velocity: decreased     General Gait Details: Slow cadence but steady without LOB  Stairs            Wheelchair Mobility    Modified Rankin (Stroke Patients Only)       Balance Overall balance assessment: Needs assistance   Sitting balance-Leahy Scale: Normal     Standing balance support: Bilateral upper extremity supported, During functional activity Standing balance-Leahy Scale: Good  Pertinent Vitals/Pain Pain Assessment Pain Assessment: No/denies pain    Home Living Family/patient expects to be discharged to:: Private residence Living Arrangements: Children Available Help at Discharge: Family;Available 24 hours/day Type of Home: House Home Access:  Ramped entrance       Home Layout: One level Home Equipment: Conservation officer, nature (2 wheels);BSC/3in1;Cane - single point Additional Comments: Lives with dtr with 24/7 supervision available    Prior Function Prior Level of Function : Independent/Modified Independent             Mobility Comments: Ind amb limited community distances without an AD with occasional PRN SPC use as needed, no fall history ADLs Comments: Ind with ADLs, sponge bathes     Hand Dominance   Dominant Hand: Right    Extremity/Trunk Assessment   Upper Extremity Assessment Upper Extremity Assessment: RUE deficits/detail;LUE deficits/detail RUE Deficits / Details: Shoulder flexion, elbow flex/ext, and grip strength all grossly WNL RUE Sensation: WNL RUE Coordination: WNL LUE Deficits / Details: Shoulder flexion, elbow flex/ext, and grip strength all grossly WNL LUE Sensation: WNL LUE Coordination: WNL    Lower Extremity Assessment Lower Extremity Assessment: RLE deficits/detail;LLE deficits/detail RLE Deficits / Details: Hip flexion and knee flex/ext strength grossly WNL RLE Sensation: WNL RLE Coordination: WNL LLE Deficits / Details: Hip flexion and knee flex/ext strength grossly WNL LLE Sensation: WNL LLE Coordination: WNL    Cervical / Trunk Assessment Cervical / Trunk Assessment: Kyphotic  Communication   Communication: No difficulties  Cognition Arousal/Alertness: Awake/alert Behavior During Therapy: WFL for tasks assessed/performed Overall Cognitive Status: Within Functional Limits for tasks assessed                                          General Comments      Exercises Total Joint Exercises Ankle Circles/Pumps: AROM, Strengthening, Both, 10 reps Quad Sets: Strengthening, Both, 10 reps Gluteal Sets: Strengthening, Both, 10 reps Heel Slides: Strengthening, Both, 5 reps Long Arc Quad: Strengthening, Both, 15 reps Knee Flexion: Strengthening, Both, 15 reps Marching in  Standing: Strengthening, Both, 5 reps, Standing Other Exercises Other Exercises: HEP edcuation for BLE APs, QS, GS, and LAQs x 10 every 1-2 hours daily while awake   Assessment/Plan    PT Assessment Patient needs continued PT services  PT Problem List Decreased activity tolerance;Decreased balance;Decreased mobility       PT Treatment Interventions DME instruction;Gait training;Functional mobility training;Therapeutic activities;Balance training;Therapeutic exercise;Patient/family education    PT Goals (Current goals can be found in the Care Plan section)  Acute Rehab PT Goals Patient Stated Goal: To get stronger and return home PT Goal Formulation: With patient Time For Goal Achievement: 03/14/22 Potential to Achieve Goals: Good    Frequency Min 2X/week     Co-evaluation               AM-PAC PT "6 Clicks" Mobility  Outcome Measure Help needed turning from your back to your side while in a flat bed without using bedrails?: A Little Help needed moving from lying on your back to sitting on the side of a flat bed without using bedrails?: A Little Help needed moving to and from a bed to a chair (including a wheelchair)?: A Little Help needed standing up from a chair using your arms (e.g., wheelchair or bedside chair)?: A Little Help needed to walk in hospital room?: A Little Help needed climbing 3-5 steps with a  railing? : A Little 6 Click Score: 18    End of Session Equipment Utilized During Treatment: Gait belt;Oxygen Activity Tolerance: Patient tolerated treatment well Patient left: in bed;with call bell/phone within reach;with bed alarm set Nurse Communication: Mobility status PT Visit Diagnosis: Difficulty in walking, not elsewhere classified (R26.2)    Time: 2591-0289 PT Time Calculation (min) (ACUTE ONLY): 34 min   Charges:   PT Evaluation $PT Eval Moderate Complexity: 1 Mod PT Treatments $Therapeutic Exercise: 8-22 mins       D. Scott Megumi Treaster PT,  DPT 03/01/22, 11:10 AM

## 2022-03-01 NOTE — ED Notes (Signed)
Assumed care from [facility]. Pt resting comfortably in bed at this time. Pt denies any current needs or questions. Call light with in reach.

## 2022-03-01 NOTE — Progress Notes (Signed)
OT Cancellation Note  Patient Details Name: [name] MRN: [id number] DOB: 10/09/1928   Cancelled Treatment:    Reason Eval/Treat Not Completed: Other (comment) . Per PT, pt declined OT evaluation. OT to complete order. Please re-consult if needed.   Darleen Crocker, MS, OTR/L , CBIS ascom [phone]  03/01/22, 10:50 AM

## 2022-03-05 NOTE — Consult Note (Signed)
NEUROLOGY CONSULTATION NOTE   Date of service: March 05, 2022 Patient Name: [name] MRN:  [id number] DOB:  09-Jun-1928 Reason for consult: CRAO Requesting physician: Dr. Val Riles _ _ _   _ __   _ __ _ _  __ __   _ __   __ _  History of Present Illness   This is a 89 yo man with hx a fib on low-dose eliquis, CAD on ASA '81mg'$  daily, HFpEF, COPD, HL, HTN, PVD who presented with complete loss of vision in L eye since 02/24/22. He was in his USOH sitting on his recliner on 1/28 when he leaned over to reach something and he had painless complete loss of vision on the L side only. The vision loss mostly persists but he can see some lights and shadows in the lateral aspect of that eye. No other neurologic deficits such as focal weakness or numbness, trouble speaking, headache, dizziness, or trouble walking. Neurology is consulted 2/2 c/f CRAO.  Stroke workup this admission:  MRI brain wo MRA H&N 1. No acute intracranial abnormality. 2. No emergent large vessel occlusion. 3. Severe stenosis of the right internal carotid artery at the carotid bifurcation extending into the ICA with angiographic string sign. Stenosis is approximately 90%.  CNS imaging personally reviewed; I agree with above interpretation  TTE - no intracardiac clot or other etiology for presentation identified  Stroke Labs     Component Value Date/Time   CHOL 158 02/28/2022 1226   CHOL 115 01/30/2014 0535   TRIG 55 02/28/2022 1226   TRIG 92 01/30/2014 0535   HDL 56 02/28/2022 1226   HDL 29 (L) 01/30/2014 0535   CHOLHDL 2.8 02/28/2022 1226   VLDL 11 02/28/2022 1226   VLDL 18 01/30/2014 0535   LDLCALC 91 02/28/2022 1226   LDLCALC 68 01/30/2014 0535    Lab Results  Component Value Date/Time   HGBA1C 6.3 (H) 02/28/2022 12:26 PM      ROS   Per HPI: all other systems reviewed and are negative  Past History   I have reviewed the following:  Past Medical History:  Diagnosis Date   (HFpEF) heart  failure with preserved ejection fraction (HCC)    Aortic atherosclerosis ([place])    Basal cell carcinoma 09/13/2021   Left cheek. EDC   Cardiac murmur    Grade I/VI medium pitched mid systolic blowing at lower LSB   COPD (chronic obstructive pulmonary disease) (HCC)    Coronary artery disease    Dependence on supplemental oxygen    HLD (hyperlipidemia)    Hypertension    Paroxysmal atrial fibrillation (HCC)    PVD (peripheral vascular disease) (HCC)    Skin cancer    nose and ear - tx with Mohs   Thoracic aortic ectasia (HCC)    a.) measured 3.6 x 3.3 cm on 10/08/2019   Past Surgical History:  Procedure Laterality Date   COLONOSCOPY  2007   ENDARTERECTOMY FEMORAL Right 08/09/2020   Procedure: RIGHT COMMON AND PROFUNDA FEMORAL ENDARTERECTOMY;  Surgeon: Algernon Huxley, MD;  Location: [facility] ORS;  Service: Vascular;  Laterality: Right;   EYE SURGERY Bilateral 2005   LOWER EXTREMITY ANGIOGRAPHY Right 07/05/2020   Procedure: LOWER EXTREMITY ANGIOGRAPHY;  Surgeon: Algernon Huxley, MD;  Location: [facility];  Service: Cardiovascular;  Laterality: Right;   Renal Bypass Surgery Bilateral 2000   History reviewed. No pertinent family history. Social History   Socioeconomic History   Marital status: Married  Spouse name: Not on file   Number of children: Not on file   Years of education: Not on file   Highest education level: Not on file  Occupational History   Not on file  Tobacco Use   Smoking status: Former    Packs/day: 1.00    Years: 30.00    Total pack years: 30.00    Types: Cigarettes    Quit date: 63    Years since quitting: 27.1   Smokeless tobacco: Never  Vaping Use   Vaping Use: Never used  Substance and Sexual Activity   Alcohol use: Never   Drug use: Never   Sexual activity: Not Currently  Other Topics Concern   Not on file  Social History Narrative   Lives at home alone with help from daughter.   Social Determinants of Health   Financial Resource  Strain: Not on file  Food Insecurity: No Food Insecurity (03/01/2022)   Hunger Vital Sign    Worried About Running Out of Food in the Last Year: Never true    Ran Out of Food in the Last Year: Never true  Transportation Needs: No Transportation Needs (03/01/2022)   PRAPARE - Hydrologist (Medical): No    Lack of Transportation (Non-Medical): No  Physical Activity: Not on file  Stress: Not on file  Social Connections: Not on file   Allergies  Allergen Reactions   Bee Venom Anaphylaxis    Medications   No medications prior to admission.     No current facility-administered medications for this encounter.  Current Outpatient Medications:    acetaminophen (TYLENOL) 500 MG tablet, Take 500 mg by mouth every 6 (six) hours as needed for fever or pain., Disp: , Rfl:    alendronate (FOSAMAX) 70 MG tablet, Take 70 mg by mouth once a week. Sunday morning with full glass of water and sit up do not lay down, Disp: , Rfl:    apixaban (ELIQUIS) 2.5 MG TABS tablet, Take 2.5 mg by mouth 2 (two) times daily., Disp: , Rfl:    ascorbic acid (VITAMIN C) 250 MG tablet, Take 1 tablet (250 mg total) by mouth 2 (two) times daily., Disp: 60 tablet, Rfl: 6   aspirin EC 81 MG EC tablet, Take 1 tablet (81 mg total) by mouth daily at 6 (six) AM. Swallow whole., Disp: 90 tablet, Rfl: 3   Calcium Carb-Cholecalciferol (CALCIUM 600/VITAMIN D PO), Take by mouth., Disp: , Rfl:    CARTIA XT 300 MG 24 hr capsule, Take 300 mg by mouth daily., Disp: , Rfl:    ciclopirox (LOPROX) 0.77 % cream, Apply topically 2 (two) times daily. Apply to feet BID x 1 month. Then QW thereafter., Disp: 90 g, Rfl: 3   ciclopirox (LOPROX) 0.77 % SUSP, Apply once daily to affected fingernails, Disp: 30 mL, Rfl: 5   Fluticasone-Umeclidin-Vilant (TRELEGY ELLIPTA) 100-62.5-25 MCG/INH AEPB, Inhale into the lungs., Disp: , Rfl:    metoprolol tartrate (LOPRESSOR) 50 MG tablet, Take 50 mg by mouth 2 (two) times daily., Disp:  , Rfl:    Multiple Vitamin (MULTIVITAMIN WITH MINERALS) TABS tablet, Take 1 tablet by mouth daily., Disp: 90 tablet, Rfl: 3   mupirocin ointment (BACTROBAN) 2 %, Apply 1 Application topically daily. Apply to wounds once daily until healed., Disp: 22 g, Rfl: 2   potassium chloride (KLOR-CON) 10 MEQ tablet, Take 2 tablets (20 mEq total) by mouth daily. (Patient taking differently: Take 10-20 mEq by mouth daily.), Disp: 60  tablet, Rfl: 5   torsemide (DEMADEX) 20 MG tablet, Take '40mg'$  on M, W, F and '20mg'$  on Tu, Thur,Sat & Sunday (Patient taking differently: Take 20-40 mg by mouth daily. Take '40mg'$  on M, F and '20mg'$  on Tu, Thur, Sat & Sunday), Disp: 40 tablet, Rfl: 3   Wheat Dextrin (BENEFIBER PO), Take by mouth. 2 teaspoons twice a day, Disp: , Rfl:    atorvastatin (LIPITOR) 40 MG tablet, Take 1 tablet (40 mg total) by mouth daily., Disp: 30 tablet, Rfl: 2   Vitamin D, Ergocalciferol, (DRISDOL) 1.25 MG (50000 UNIT) CAPS capsule, Take 1 capsule (50,000 Units total) by mouth every 7 (seven) days., Disp: 5 capsule, Rfl: 0  Vitals   Vitals:   02/28/22 2340 03/01/22 0606 03/01/22 1004 03/01/22 1216  BP: (!) 168/74 (!) 157/78 (!) 134/59 133/67  Pulse: 64 78 98 97  Resp: 18 (!) 21 (!) 22 20  Temp: 98 F (36.7 C) 97.7 F (36.5 C) 97.9 F (36.6 C) 98.3 F (36.8 C)  TempSrc: Oral Oral    SpO2: 98% 98% 95% 92%  Weight:      Height:         Body mass index is 19.4 kg/m.  Physical Exam   Physical Exam Gen: A&O x4, NAD HEENT: Atraumatic, normocephalic;mucous membranes moist; oropharynx clear, tongue without atrophy or fasciculations. Neck: Supple, trachea midline. Resp: CTAB, no w/r/r CV: RRR, no m/g/r; nml S1 and S2. 2+ symmetric peripheral pulses. Abd: soft/NT/ND; nabs x 4 quad Extrem: Nml bulk; no cyanosis, clubbing, or edema.  Neuro: *MS: A&O x4. Follows multi-step commands.  *Speech: fluid, nondysarthric, able to name and repeat *CN:    I: Deferred   II,III: PERRLA, VFF by confrontation  in R eye only, can only see some lights/shadows out of lateral aspect otherwise no vision in L eye, optic discs unable to be visualized 2/2 pupillary constriction   III,IV,VI: EOMI w/o nystagmus, no ptosis   V: Sensation intact from V1 to V3 to LT   VII: Eyelid closure was full.  Smile symmetric.   VIII: Hearing intact to voice   IX,X: Voice normal, palate elevates symmetrically    XI: SCM/trap 5/5 bilat   XII: Tongue protrudes midline, no atrophy or fasciculations   *Motor:   Normal bulk.  No tremor, rigidity or bradykinesia. No pronator drift.    Strength: Dlt Bic Tri WrE WrF FgS Gr HF KnF KnE PlF DoF    Left '5 5 5 5 5 5 5 5 5 5 5 5    '$ Right '5 5 5 5 5 5 5 5 5 5 5 5    '$ *Sensory: Intact to light touch, pinprick, temperature vibration throughout. Symmetric. Propioception intact bilat.  No double-simultaneous extinction.  *Coordination:  Finger-to-nose, heel-to-shin, rapid alternating motions were intact. *Reflexes:  2+ and symmetric throughout without clonus; toes down-going bilat *Gait: deferred  NIHSS = 2 visual   Premorbid mRS = 1   Labs   CBC:  Recent Labs  Lab 02/28/22 1226 03/01/22 0605  WBC 11.2* 11.4*  NEUTROABS 7.5 7.6  HGB 10.7* 10.7*  HCT 33.2* 32.9*  MCV 95.7 95.1  PLT 252 081    Basic Metabolic Panel:  Lab Results  Component Value Date   NA 140 03/01/2022   K 3.6 03/01/2022   CO2 34 (H) 03/01/2022   GLUCOSE 86 03/01/2022   BUN 41 (H) 03/01/2022   CREATININE 1.30 (H) 03/01/2022   CALCIUM 9.6 03/01/2022   GFRNONAA 52 (L) 03/01/2022  GFRAA >60 10/15/2019   Lipid Panel:  Lab Results  Component Value Date   LDLCALC 91 02/28/2022   HgbA1c:  Lab Results  Component Value Date   HGBA1C 6.3 (H) 02/28/2022   Urine Drug Screen:     Component Value Date/Time   LABOPIA NONE DETECTED 02/28/2022 1212   COCAINSCRNUR NONE DETECTED 02/28/2022 1212   LABBENZ NONE DETECTED 02/28/2022 1212   AMPHETMU NONE DETECTED 02/28/2022 1212   THCU NONE DETECTED  02/28/2022 1212   LABBARB NONE DETECTED 02/28/2022 1212    Alcohol Level     Component Value Date/Time   ETH <10 02/28/2022 1226     Impression   This is a 89 yo man with hx a fib on low-dose eliquis, CAD on ASA '81mg'$  daily, HFpEF, COPD, HL, HTN, PVD who presented L eye vision loss favored to be 2/2 L CRAO. His inpatient stroke workup is completed and he was resumed on home doses of eliquis and aspirin. During his workup he was found on MRA neck to have an incidental finding of 90% carotid stenosis on the R. This is unrelated to his current presentation however does increase his risk of stroke in the future. Recommend consideration of elective revascularization as an outpatient. Patient already follows with Dr. Lucky Cowboy who performed multiple lower extremity endarterectomies on him in the past.  Recommendations   - Inpatient neurologic workup completed - OK to resume home dose eliquis for a fib - OK to continue home ASA '81mg'$  daily for CAD - Atorvastatin '40mg'$  daily - I will refer him to outpatient neurology - Recommend close f/u with Dr. Lucky Cowboy to consider elective revascularization of R carotid; I will route this consult note to Dr. Lucky Cowboy as well - OK to discharge from neuro standpoint ______________________________________________________________________   Thank you for the opportunity to take part in the care of this patient. If you have any further questions, please contact the neurology consultation attending.  Signed,  Su Monks, MD [facility] [phone]  If 7pm- 7am, please page neurology on call as listed in [place].  **Any copied and pasted documentation in this note was written by me in another application not billed for and pasted by me into this document.  This consultation was performed by me on service date 03/01/22 but was not signed by me until 03/05/22.

## 2022-03-08 ENCOUNTER — Ambulatory Visit: Payer: Medicare Other | Admitting: Family

## 2022-03-08 ENCOUNTER — Ambulatory Visit (INDEPENDENT_AMBULATORY_CARE_PROVIDER_SITE_OTHER): Payer: Medicare Other | Admitting: Vascular Surgery

## 2022-03-08 ENCOUNTER — Encounter: Payer: Self-pay | Admitting: Family

## 2022-03-08 ENCOUNTER — Ambulatory Visit (INDEPENDENT_AMBULATORY_CARE_PROVIDER_SITE_OTHER): Payer: Medicare Other | Admitting: Nurse Practitioner

## 2022-03-08 ENCOUNTER — Ambulatory Visit: Payer: Medicare Other | Attending: Family | Admitting: Family

## 2022-03-08 ENCOUNTER — Encounter (INDEPENDENT_AMBULATORY_CARE_PROVIDER_SITE_OTHER): Payer: Self-pay | Admitting: Vascular Surgery

## 2022-03-08 VITALS — BP 128/58 | HR 78 | Resp 18

## 2022-03-08 VITALS — BP 142/60 | HR 100 | Resp 16 | Wt 143.0 lb

## 2022-03-08 DIAGNOSIS — J449 Chronic obstructive pulmonary disease, unspecified: Secondary | ICD-10-CM | POA: Diagnosis not present

## 2022-03-08 DIAGNOSIS — I6529 Occlusion and stenosis of unspecified carotid artery: Secondary | ICD-10-CM | POA: Diagnosis not present

## 2022-03-08 DIAGNOSIS — Z87891 Personal history of nicotine dependence: Secondary | ICD-10-CM | POA: Diagnosis not present

## 2022-03-08 DIAGNOSIS — I2510 Atherosclerotic heart disease of native coronary artery without angina pectoris: Secondary | ICD-10-CM | POA: Insufficient documentation

## 2022-03-08 DIAGNOSIS — I110 Hypertensive heart disease with heart failure: Secondary | ICD-10-CM | POA: Diagnosis not present

## 2022-03-08 DIAGNOSIS — E785 Hyperlipidemia, unspecified: Secondary | ICD-10-CM | POA: Diagnosis not present

## 2022-03-08 DIAGNOSIS — I739 Peripheral vascular disease, unspecified: Secondary | ICD-10-CM | POA: Insufficient documentation

## 2022-03-08 DIAGNOSIS — I10 Essential (primary) hypertension: Secondary | ICD-10-CM

## 2022-03-08 DIAGNOSIS — R059 Cough, unspecified: Secondary | ICD-10-CM | POA: Insufficient documentation

## 2022-03-08 DIAGNOSIS — Z9981 Dependence on supplemental oxygen: Secondary | ICD-10-CM | POA: Insufficient documentation

## 2022-03-08 DIAGNOSIS — I6521 Occlusion and stenosis of right carotid artery: Secondary | ICD-10-CM

## 2022-03-08 DIAGNOSIS — I5032 Chronic diastolic (congestive) heart failure: Secondary | ICD-10-CM | POA: Diagnosis not present

## 2022-03-08 DIAGNOSIS — R0602 Shortness of breath: Secondary | ICD-10-CM | POA: Diagnosis present

## 2022-03-08 NOTE — Assessment & Plan Note (Signed)
Severe and on chronic oxygen therapy.  Would be high risk for open surgical therapy and would benefit from percutaneous carotid intervention with stenting.

## 2022-03-08 NOTE — Progress Notes (Signed)
Patient ID: [name], male    DOB: August 10, 1928, 89 y.o.   MRN: [id number]   [name] is a 89 y/o male with a history of CAD, HTN, previous tobacco use and chronic heart failure.   Echo 03/01/22 showed an EF of 60-65% along with mild LVH, severely elevated PA pressure of 77.7 mmHg, moderate LAE, severe RAE, mild/ moderate Christopher, moderate TR and moderate AS. Echo report from 10/08/19 reviewed and showed an EF of 55-60% along with moderately elevated PA pressure, severe LAE/ RAE and mild Christopher. Echo report from 01/06/2019 reviewed and showed an EF of 55-60% along with mild/ moderate Christopher and moderately elevated PA pressure.   Admitted 02/28/22 due to sudden loss of vision in the left eye. Head CT and brain MRI negative for stroke. MRA shows right ICA 90% stenosis.   He presents for a f/u with a chief complaint of moderate SOB with little exertion. Describes this as chronic in nature. Has associated fatigue, cough, pedal edema & bruising along with this. Denies any dizziness, abdominal distention, palpitations, chest pain, wheezing or weight gain.   Continues to have left side vision loss. Goes to vascular later today.   He says that he had his entire left arm bruised due to dinemap being on his arm "constantly" to check his BP during recent admission. He requests manual BP be taken in his other arm  Past Medical History:  Diagnosis Date   (HFpEF) heart failure with preserved ejection fraction (HCC)    Aortic atherosclerosis ([place])    Basal cell carcinoma 09/13/2021   Left cheek. EDC   Cardiac murmur    Grade I/VI medium pitched mid systolic blowing at lower LSB   COPD (chronic obstructive pulmonary disease) (HCC)    Coronary artery disease    Dependence on supplemental oxygen    HLD (hyperlipidemia)    Hypertension    Paroxysmal atrial fibrillation (HCC)    PVD (peripheral vascular disease) (HCC)    Skin cancer    nose and ear - tx with Mohs   Thoracic aortic ectasia (HCC)    a.) measured  3.6 x 3.3 cm on 10/08/2019   Past Surgical History:  Procedure Laterality Date   COLONOSCOPY  2007   ENDARTERECTOMY FEMORAL Right 08/09/2020   Procedure: RIGHT COMMON AND PROFUNDA FEMORAL ENDARTERECTOMY;  Surgeon: Algernon Huxley, MD;  Location: [facility];  Service: Vascular;  Laterality: Right;   EYE SURGERY Bilateral 2005   LOWER EXTREMITY ANGIOGRAPHY Right 07/05/2020   Procedure: LOWER EXTREMITY ANGIOGRAPHY;  Surgeon: Algernon Huxley, MD;  Location: [facility];  Service: Cardiovascular;  Laterality: Right;   Renal Bypass Surgery Bilateral 2000   No family history on file. Social History   Tobacco Use   Smoking status: Former    Packs/day: 1.00    Years: 30.00    Total pack years: 30.00    Types: Cigarettes    Quit date: 1997    Years since quitting: 27.1   Smokeless tobacco: Never  Substance Use Topics   Alcohol use: Never   Allergies  Allergen Reactions   Bee Venom Anaphylaxis   Prior to Admission medications   Medication Sig Start Date End Date Taking? Authorizing Provider  acetaminophen (TYLENOL) 500 MG tablet Take 500 mg by mouth every 6 (six) hours as needed for fever or pain.   Yes [provider]  alendronate (FOSAMAX) 70 MG tablet Take 70 mg by mouth once a week. Sunday morning with full  glass of water and sit up do not lay down 06/07/20  Yes [provider]  apixaban (ELIQUIS) 2.5 MG TABS tablet Take 2.5 mg by mouth 2 (two) times daily.   Yes [provider]  ascorbic acid (VITAMIN C) 250 MG tablet Take 1 tablet (250 mg total) by mouth 2 (two) times daily. 08/11/20  Yes Stegmayer, Janalyn Harder, PA-C  aspirin EC 81 MG EC tablet Take 1 tablet (81 mg total) by mouth daily at 6 (six) AM. Swallow whole. 08/11/20  Yes Stegmayer, Janalyn Harder, PA-C  atorvastatin (LIPITOR) 40 MG tablet Take 1 tablet (40 mg total) by mouth daily. 03/01/22 05/30/22 Yes Val Riles, MD  Calcium Carb-Cholecalciferol (CALCIUM 600/VITAMIN D PO) Take by mouth.   Yes [provider]  CARTIA XT 300 MG 24 hr capsule Take 300 mg by mouth daily. 09/16/19  Yes [provider]  ciclopirox (LOPROX) 0.77 % cream Apply topically 2 (two) times daily. Apply to feet BID x 1 month. Then QW thereafter. 07/25/21  Yes Moye, [place], MD  ciclopirox (LOPROX) 0.77 % SUSP Apply once daily to affected fingernails 08/01/21  Yes Moye, [place], MD  Fluticasone-Umeclidin-Vilant (TRELEGY ELLIPTA) 100-62.5-25 MCG/INH AEPB Inhale into the lungs.   Yes [provider]  metoprolol tartrate (LOPRESSOR) 50 MG tablet Take 50 mg by mouth 2 (two) times daily.   Yes [provider]  Multiple Vitamin (MULTIVITAMIN WITH MINERALS) TABS tablet Take 1 tablet by mouth daily. 08/12/20  Yes Stegmayer, Janalyn Harder, PA-C  mupirocin ointment (BACTROBAN) 2 % Apply 1 Application topically daily. Apply to wounds once daily until healed. 07/25/21  Yes Moye, [place], MD  potassium chloride (KLOR-CON) 10 MEQ tablet Take 2 tablets (20 mEq total) by mouth daily. Patient taking differently: Take 10-20 mEq by mouth daily. 01/14/20  Yes Alisa Graff, FNP  torsemide (DEMADEX) 20 MG tablet Take 55m on M, W, F and 24mon Tu, Thur,Sat & Sunday Patient taking differently: Take 20-40 mg by mouth daily. Take 4057mn M, F and 44m40m Tu, Thur, Sat & Sunday 01/11/22  Yes Santasia Rew, TinaOtila KluverFNP  Vitamin D, Ergocalciferol, (DRISDOL) 1.25 MG (50000 UNIT) CAPS capsule Take 1 capsule (50,000 Units total) by mouth every 7 (seven) days. 10/15/19  Yes Wouk, NoahAilene Rud  Wheat Dextrin (BENEFIBER PO) Take by mouth. 2 teaspoons twice a day   Yes [provider]    Review of Systems  Constitutional:  Positive for fatigue. Negative for appetite change.  HENT:  Negative for congestion, postnasal drip and sore throat.   Eyes:  Positive for visual disturbance (left eye w/ vision loss).  Respiratory:  Positive for cough and shortness of breath (stable). Negative for chest tightness and wheezing.    Cardiovascular:  Positive for leg swelling (improving). Negative for chest pain and palpitations.  Gastrointestinal:  Negative for abdominal distention and abdominal pain.  Endocrine: Negative.   Genitourinary: Negative.   Musculoskeletal:  Negative for arthralgias and back pain.  Skin: Negative.   Allergic/Immunologic: Negative.   Neurological:  Negative for dizziness and light-headedness.  Hematological:  Negative for adenopathy. Bruises/bleeds easily (entire left arm).  Psychiatric/Behavioral:  Negative for dysphoric mood and sleep disturbance (sleeping on 2 pillows). The patient is not nervous/anxious.    Vitals:   03/08/22 0920  BP: (!) 142/60  Pulse: 100  Resp: 16  SpO2: 92%  Weight: 143 lb (64.9 kg)   Wt Readings from Last 3 Encounters:  03/08/22 143 lb (64.9 kg)  02/28/22  143 lb 1.3 oz (64.9 kg)  02/07/22 143 lb (64.9 kg)   Lab Results  Component Value Date   CREATININE 1.30 (H) 03/01/2022   CREATININE 1.36 (H) 02/28/2022   CREATININE 1.36 (H) 01/23/2022   Physical Exam Vitals and nursing note reviewed.  Constitutional:      Appearance: He is well-developed.  HENT:     Head: Normocephalic and atraumatic.  Neck:     Vascular: No JVD.  Cardiovascular:     Rate and Rhythm: Normal rate and regular rhythm.  Pulmonary:     Effort: Pulmonary effort is normal. No respiratory distress.     Breath sounds: No wheezing or rales.  Abdominal:     Palpations: Abdomen is soft.     Tenderness: There is no abdominal tenderness.  Musculoskeletal:        General: No tenderness.     Cervical back: Normal range of motion and neck supple.     Right lower leg: No tenderness. Edema (trace pitting) present.     Left lower leg: No tenderness. Edema (trace pitting) present.  Skin:    General: Skin is warm and dry.     Findings: Bruising (entire left arm) and ecchymosis present.  Neurological:     General: No focal deficit present.     Mental Status: He is alert and oriented to  person, place, and time.  Psychiatric:        Mood and Affect: Mood normal.        Behavior: Behavior normal.    Assessment & Plan:  1: Chronic heart failure with preserved ejection fraction with mild LVH- - NYHA class III - euvolemic today  - weighing daily; reminded to call for an overnight weight gain of > 2 pounds or a weekly weight gain of > 5 pounds - weight stable from last visit here 6 weeks ago - not adding salt to his food but does eat out often - saw cardiology (Fath) 11/21/21; has to get f/u appt scheduled - saw palliative care 07/05/21 - participating in paramedicine program  - BNP 10/08/19 was 1004.8 - received his flu vaccine for this season  2: HTN- - BP 142/60 - saw PCP [name]) 12/19/21 - BMP 03/01/22 reviewed and showed sodium 140, potassium 3.6, creatinine 1.3 and GFR 52  3: COPD/ PAH- - wearing oxygen at 3L around the clock - saw pulmonology Raul Del) 04/30/21; has to get f/u appt scheduled - using nebulizer 4 times/ day  4: Carotid stenosis- - right is 90% stenosed - sees vascular (Dew) later today   Medication list was reviewed.  Return in 3 months, sooner if needed.

## 2022-03-08 NOTE — H&P (View-Only) (Signed)
MRN : [id number]  [name] is a 89 y.o. ([birth year]) male who presents with chief complaint of  Chief Complaint  Patient presents with   Follow-up    Follow up Consult  for Severe stenosis of the right internal carotid artery at thecarotid bifurcation extending into the ICA with angiographic string sign. Stenosis is approximately 90%.  .  History of Present Illness: Patient returns today in follow up with a new referral for carotid stenosis.  He had a recent stroke and as part of his workup he had an MRI and an MR angiogram. I have independently reviewed his MR angiogram.  MRI is not nearly as accurate for evaluation of carotid stenosis, but this does suggest a very high-grade stenosis in the right internal carotid artery in the cervical portion of 90 or greater percent.  Given this finding, he is referred for further evaluation and treatment.  He is a longstanding patient of ours and we have done much lower extremity work over the years but this has not been checked in the last year or so either.  He has oxygen dependent COPD.  He has other medical comorbidities as listed below.  He is currently on Eliquis, aspirin, and Lipitor  Current Outpatient Medications  Medication Sig Dispense Refill   acetaminophen (TYLENOL) 500 MG tablet Take 500 mg by mouth every 6 (six) hours as needed for fever or pain.     alendronate (FOSAMAX) 70 MG tablet Take 70 mg by mouth once a week. Sunday morning with full glass of water and sit up do not lay down     apixaban (ELIQUIS) 2.5 MG TABS tablet Take 2.5 mg by mouth 2 (two) times daily.     ascorbic acid (VITAMIN C) 250 MG tablet Take 1 tablet (250 mg total) by mouth 2 (two) times daily. 60 tablet 6   aspirin EC 81 MG EC tablet Take 1 tablet (81 mg total) by mouth daily at 6 (six) AM. Swallow whole. 90 tablet 3   atorvastatin (LIPITOR) 40 MG tablet Take 1 tablet (40 mg total) by mouth daily. 30 tablet 2   Calcium Carb-Cholecalciferol (CALCIUM  600/VITAMIN D PO) Take by mouth.     CARTIA XT 300 MG 24 hr capsule Take 300 mg by mouth daily.     ciclopirox (LOPROX) 0.77 % cream Apply topically 2 (two) times daily. Apply to feet BID x 1 month. Then QW thereafter. 90 g 3   ciclopirox (LOPROX) 0.77 % SUSP Apply once daily to affected fingernails 30 mL 5   Fluticasone-Umeclidin-Vilant (TRELEGY ELLIPTA) 100-62.5-25 MCG/INH AEPB Inhale into the lungs.     metoprolol tartrate (LOPRESSOR) 50 MG tablet Take 50 mg by mouth 2 (two) times daily.     Multiple Vitamin (MULTIVITAMIN WITH MINERALS) TABS tablet Take 1 tablet by mouth daily. 90 tablet 3   mupirocin ointment (BACTROBAN) 2 % Apply 1 Application topically daily. Apply to wounds once daily until healed. 22 g 2   potassium chloride (KLOR-CON) 10 MEQ tablet Take 2 tablets (20 mEq total) by mouth daily. (Patient taking differently: Take 10-20 mEq by mouth daily.) 60 tablet 5   torsemide (DEMADEX) 20 MG tablet Take 86m on M, W, F and 260mon Tu, Thur,Sat & Sunday (Patient taking differently: Take 20-40 mg by mouth daily. Take 4053mn M, F and 18m72m Tu, Thur, Sat & Sunday) 40 tablet 3   Vitamin D, Ergocalciferol, (DRISDOL) 1.25 MG (50000 UNIT) CAPS capsule Take 1 capsule (50,000  Units total) by mouth every 7 (seven) days. 5 capsule 0   Wheat Dextrin (BENEFIBER PO) Take by mouth. 2 teaspoons twice a day     No current facility-administered medications for this visit.    Past Medical History:  Diagnosis Date   (HFpEF) heart failure with preserved ejection fraction (HCC)    Aortic atherosclerosis ([place])    Basal cell carcinoma 09/13/2021   Left cheek. EDC   Cardiac murmur    Grade I/VI medium pitched mid systolic blowing at lower LSB   COPD (chronic obstructive pulmonary disease) (HCC)    Coronary artery disease    Dependence on supplemental oxygen    HLD (hyperlipidemia)    Hypertension    Paroxysmal atrial fibrillation (HCC)    PVD (peripheral vascular disease) (HCC)    Skin cancer     nose and ear - tx with Mohs   Thoracic aortic ectasia (HCC)    a.) measured 3.6 x 3.3 cm on 10/08/2019    Past Surgical History:  Procedure Laterality Date   COLONOSCOPY  2007   ENDARTERECTOMY FEMORAL Right 08/09/2020   Procedure: RIGHT COMMON AND PROFUNDA FEMORAL ENDARTERECTOMY;  Surgeon: Algernon Huxley, MD;  Location: [facility];  Service: Vascular;  Laterality: Right;   EYE SURGERY Bilateral 2005   LOWER EXTREMITY ANGIOGRAPHY Right 07/05/2020   Procedure: LOWER EXTREMITY ANGIOGRAPHY;  Surgeon: Algernon Huxley, MD;  Location: [place] [facility];  Service: Cardiovascular;  Laterality: Right;   Renal Bypass Surgery Bilateral 2000     Social History   Tobacco Use   Smoking status: Former    Packs/day: 1.00    Years: 30.00    Total pack years: 30.00    Types: Cigarettes    Quit date: 20    Years since quitting: 27.1   Smokeless tobacco: Never  Vaping Use   Vaping Use: Never used  Substance Use Topics   Alcohol use: Never   Drug use: Never      Family History No bleeding disorders, clotting disorders, autoimmune diseases or aneurysms  Allergies  Allergen Reactions   Bee Venom Anaphylaxis     REVIEW OF SYSTEMS (Negative unless checked)  Constitutional: []$ Weight loss  []$ Fever  []$ Chills Cardiac: []$ Chest pain   []$ Chest pressure   []$ Palpitations   []$ Shortness of breath when laying flat   [x]$ Shortness of breath at rest   [x]$ Shortness of breath with exertion. Vascular:  []$ Pain in legs with walking   []$ Pain in legs at rest   []$ Pain in legs when laying flat   []$ Claudication   []$ Pain in feet when walking  []$ Pain in feet at rest  []$ Pain in feet when laying flat   []$ History of DVT   []$ Phlebitis   []$ Swelling in legs   []$ Varicose veins   []$ Non-healing ulcers Pulmonary:   [x]$ Uses home oxygen   []$ Productive cough   []$ Hemoptysis   []$ Wheeze  [x]$ COPD   []$ Asthma Neurologic:  []$ Dizziness  []$ Blackouts   []$ Seizures   [x]$ History of stroke   []$ History of TIA  []$ Aphasia   []$ Temporary blindness    []$ Dysphagia   []$ Weakness or numbness in arms   []$ Weakness or numbness in legs Musculoskeletal:  [x]$ Arthritis   []$ Joint swelling   [x]$ Joint pain   []$ Low back pain Hematologic:  []$ Easy bruising  []$ Easy bleeding   []$ Hypercoagulable state   []$ Anemic   Gastrointestinal:  []$ Blood in stool   []$ Vomiting blood  []$ Gastroesophageal reflux/heartburn   []$ Abdominal pain Genitourinary:  []$ Chronic kidney disease   []$ Difficult urination  []$ Frequent  urination  []$ Burning with urination   []$ Hematuria Skin:  []$ Rashes   []$ Ulcers   []$ Wounds Psychological:  []$ History of anxiety   []$  History of major depression.  Physical Examination  BP (!) 128/58 (BP Location: Right Arm)   Pulse 78   Resp 18  Gen:   elderly and debilitated appearing. NAD Head: [place]/AT, + temporalis wasting. Ear/Nose/Throat: Hearing grossly intact, nares w/o erythema or drainage Eyes: Conjunctiva clear. Sclera non-icteric Neck: Supple.  Trachea midline Pulmonary:  Good air movement, no use of accessory muscles.  Cardiac: irregular Vascular:  Vessel Right Left  Radial Palpable Palpable                          PT 1+ Palpable 1+ Palpable  DP Not Palpable Not Palpable   Gastrointestinal: soft, non-tender/non-distended. No guarding/reflex.  Musculoskeletal: M/S 5/5 throughout.  No deformity or atrophy. No edema. Neurologic: Sensation grossly intact in extremities.  Symmetrical.  Speech is fluent.  Psychiatric: Judgment intact, Mood & affect appropriate for pt's clinical situation. Dermatologic: No rashes or ulcers noted.  No cellulitis or open wounds.      Labs Recent Results (from the past 2160 hour(s))  Basic metabolic panel     Status: Abnormal   Collection Time: 01/23/22 12:23 PM  Result Value Ref Range   Sodium 140 135 - 145 mmol/L   Potassium 4.0 3.5 - 5.1 mmol/L   Chloride 96 (L) 98 - 111 mmol/L   CO2 35 (H) 22 - 32 mmol/L   Glucose, Bld 104 (H) 70 - 99 mg/dL    Comment: Glucose reference range applies only to samples  taken after fasting for at least 8 hours.   BUN 48 (H) 8 - 23 mg/dL   Creatinine, Ser 1.36 (H) 0.61 - 1.24 mg/dL   Calcium 11.3 (H) 8.9 - 10.3 mg/dL   GFR, Estimated 49 (L) >60 mL/min    Comment: (NOTE) Calculated using the CKD-EPI Creatinine Equation (2021)    Anion gap 9 5 - 15    Comment: Performed at [facility], [address] [place], [place] [address]  Urine Drug Screen, Qualitative     Status: None   Collection Time: 02/28/22 12:12 PM  Result Value Ref Range   Tricyclic, Ur Screen NONE DETECTED NONE DETECTED   Amphetamines, Ur Screen NONE DETECTED NONE DETECTED   MDMA (Ecstasy)Ur Screen NONE DETECTED NONE DETECTED   Cocaine Metabolite,Ur [place] NONE DETECTED NONE DETECTED   Opiate, Ur Screen NONE DETECTED NONE DETECTED   Phencyclidine (PCP) Ur S NONE DETECTED NONE DETECTED   Cannabinoid 50 Ng, Ur  NONE DETECTED NONE DETECTED   Barbiturates, Ur Screen NONE DETECTED NONE DETECTED   Benzodiazepine, Ur Scrn NONE DETECTED NONE DETECTED   Methadone Scn, Ur NONE DETECTED NONE DETECTED    Comment: (NOTE) Tricyclics + metabolites, urine    Cutoff 1000 ng/mL Amphetamines + metabolites, urine  Cutoff 1000 ng/mL MDMA (Ecstasy), urine              Cutoff 500 ng/mL Cocaine Metabolite, urine          Cutoff 300 ng/mL Opiate + metabolites, urine        Cutoff 300 ng/mL Phencyclidine (PCP), urine         Cutoff 25 ng/mL Cannabinoid, urine                 Cutoff 50 ng/mL Barbiturates + metabolites, urine  Cutoff 200 ng/mL Benzodiazepine, urine  Cutoff 200 ng/mL Methadone, urine                   Cutoff 300 ng/mL  The urine drug screen provides only a preliminary, unconfirmed analytical test result and should not be used for non-medical purposes. Clinical consideration and professional judgment should be applied to any positive drug screen result due to possible interfering substances. A more specific alternate chemical method must be used in order to obtain a  confirmed analytical result. Gas chromatography / mass spectrometry (GC/MS) is the preferred confirm atory method. Performed at [facility], [address] [place], [place] [address]   Ethanol     Status: None   Collection Time: 02/28/22 12:26 PM  Result Value Ref Range   Alcohol, Ethyl (B) <10 <10 mg/dL    Comment: (NOTE) Lowest detectable limit for serum alcohol is 10 mg/dL.  For medical purposes only. Performed at [facility], [address] [place], [place] [address]   Protime-INR     Status: Abnormal   Collection Time: 02/28/22 12:26 PM  Result Value Ref Range   Prothrombin Time 17.9 (H) 11.4 - 15.2 seconds   INR 1.5 (H) 0.8 - 1.2    Comment: (NOTE) INR goal varies based on device and disease states. Performed at [facility], [address] [place], [place] [address]   APTT     Status: None   Collection Time: 02/28/22 12:26 PM  Result Value Ref Range   aPTT 32 24 - 36 seconds    Comment: Performed at [facility], [address] [place], [place] [address]  Comprehensive metabolic panel     Status: Abnormal   Collection Time: 02/28/22 12:26 PM  Result Value Ref Range   Sodium 137 135 - 145 mmol/L   Potassium 4.0 3.5 - 5.1 mmol/L   Chloride 96 (L) 98 - 111 mmol/L   CO2 30 22 - 32 mmol/L   Glucose, Bld 106 (H) 70 - 99 mg/dL    Comment: Glucose reference range applies only to samples taken after fasting for at least 8 hours.   BUN 48 (H) 8 - 23 mg/dL   Creatinine, Ser 1.36 (H) 0.61 - 1.24 mg/dL   Calcium 10.5 (H) 8.9 - 10.3 mg/dL   Total Protein 7.3 6.5 - 8.1 g/dL   Albumin 3.9 3.5 - 5.0 g/dL   AST 28 15 - 41 U/L   ALT 18 0 - 44 U/L   Alkaline Phosphatase 57 38 - 126 U/L   Total Bilirubin 0.9 0.3 - 1.2 mg/dL   GFR, Estimated 49 (L) >60 mL/min    Comment: (NOTE) Calculated using the CKD-EPI Creatinine Equation (2021)    Anion gap 11 5 - 15    Comment: Performed at [facility], [address]  [place], [place] [address]  CBC with Differential     Status: Abnormal   Collection Time: 02/28/22 12:26 PM  Result Value Ref Range   WBC 11.2 (H) 4.0 - 10.5 K/uL   RBC 3.47 (L) 4.22 - 5.81 MIL/uL   Hemoglobin 10.7 (L) 13.0 - 17.0 g/dL   HCT 33.2 (L) 39.0 - 52.0 %   MCV 95.7 80.0 - 100.0 fL   MCH 30.8 26.0 - 34.0 pg   MCHC 32.2 30.0 - 36.0 g/dL   RDW 14.2 11.5 - 15.5 %   Platelets 252 150 - 400 K/uL   nRBC 0.0 0.0 - 0.2 %   Neutrophils Relative % 67 %  Neutro Abs 7.5 1.7 - 7.7 K/uL   Lymphocytes Relative 20 %   Lymphs Abs 2.3 0.7 - 4.0 K/uL   Monocytes Relative 10 %   Monocytes Absolute 1.2 (H) 0.1 - 1.0 K/uL   Eosinophils Relative 1 %   Eosinophils Absolute 0.2 0.0 - 0.5 K/uL   Basophils Relative 1 %   Basophils Absolute 0.1 0.0 - 0.1 K/uL   Immature Granulocytes 1 %   Abs Immature Granulocytes 0.06 0.00 - 0.07 K/uL    Comment: Performed at [facility], [address] [place], [place] [address]  Sedimentation rate     Status: Abnormal   Collection Time: 02/28/22 12:26 PM  Result Value Ref Range   Sed Rate 41 (H) 0 - 20 mm/hr    Comment: Performed at [facility], [address] [place], [place] [address]  C-reactive protein     Status: Abnormal   Collection Time: 02/28/22 12:26 PM  Result Value Ref Range   CRP 1.0 (H) <1.0 mg/dL    Comment: Performed at [facility] [address] [place], [place] [address]  Lipid panel     Status: None   Collection Time: 02/28/22 12:26 PM  Result Value Ref Range   Cholesterol 158 0 - 200 mg/dL   Triglycerides 55 <150 mg/dL   HDL 56 >40 mg/dL   Total CHOL/HDL Ratio 2.8 RATIO   VLDL 11 0 - 40 mg/dL   LDL Cholesterol 91 0 - 99 mg/dL    Comment:        Total Cholesterol/HDL:CHD Risk Coronary Heart Disease Risk Table                     Men   Women  1/2 Average Risk   3.4   3.3  Average Risk       5.0   4.4  2 X Average Risk   9.6   7.1  3 X Average Risk  23.4   11.0        Use the calculated Patient  Ratio above and the CHD Risk Table to determine the patient's CHD Risk.        ATP III CLASSIFICATION (LDL):  <100     mg/dL   Optimal  100-129  mg/dL   Near or Above                    Optimal  130-159  mg/dL   Borderline  160-189  mg/dL   High  >190     mg/dL   Very High Performed at [facility], [address] [place], [place] [address]   Hemoglobin A1c     Status: Abnormal   Collection Time: 02/28/22 12:26 PM  Result Value Ref Range   Hgb A1c MFr Bld 6.3 (H) 4.8 - 5.6 %    Comment: (NOTE) Pre diabetes:          5.7%-6.4%  Diabetes:              >6.4%  Glycemic control for   <7.0% adults with diabetes    Mean Plasma Glucose 134.11 mg/dL    Comment: Performed at [facility] [address] [place], [place] [address]  CBC with Differential/Platelet     Status: Abnormal   Collection Time: 03/01/22  6:05 AM  Result Value Ref Range   WBC 11.4 (H) 4.0 - 10.5 K/uL   RBC 3.46 (L) 4.22 - 5.81 MIL/uL  Hemoglobin 10.7 (L) 13.0 - 17.0 g/dL   HCT 32.9 (L) 39.0 - 52.0 %   MCV 95.1 80.0 - 100.0 fL   MCH 30.9 26.0 - 34.0 pg   MCHC 32.5 30.0 - 36.0 g/dL   RDW 14.3 11.5 - 15.5 %   Platelets 238 150 - 400 K/uL   nRBC 0.0 0.0 - 0.2 %   Neutrophils Relative % 65 %   Neutro Abs 7.6 1.7 - 7.7 K/uL   Lymphocytes Relative 17 %   Lymphs Abs 2.0 0.7 - 4.0 K/uL   Monocytes Relative 14 %   Monocytes Absolute 1.5 (H) 0.1 - 1.0 K/uL   Eosinophils Relative 2 %   Eosinophils Absolute 0.2 0.0 - 0.5 K/uL   Basophils Relative 1 %   Basophils Absolute 0.1 0.0 - 0.1 K/uL   Immature Granulocytes 1 %   Abs Immature Granulocytes 0.07 0.00 - 0.07 K/uL    Comment: Performed at [facility], [address] [place], [place] [id number]  Basic metabolic panel     Status: Abnormal   Collection Time: 03/01/22  6:05 AM  Result Value Ref Range   Sodium 140 135 - 145 mmol/L   Potassium 3.6 3.5 - 5.1 mmol/L   Chloride 98 98 - 111 mmol/L   CO2 34 (H) 22 - 32 mmol/L   Glucose,  Bld 86 70 - 99 mg/dL    Comment: Glucose reference range applies only to samples taken after fasting for at least 8 hours.   BUN 41 (H) 8 - 23 mg/dL   Creatinine, Ser 1.30 (H) 0.61 - 1.24 mg/dL   Calcium 9.6 8.9 - 10.3 mg/dL   GFR, Estimated 52 (L) >60 mL/min    Comment: (NOTE) Calculated using the CKD-EPI Creatinine Equation (2021)    Anion gap 8 5 - 15    Comment: Performed at [facility], [address] [place], [place] [address]  Magnesium     Status: None   Collection Time: 03/01/22  6:05 AM  Result Value Ref Range   Magnesium 2.0 1.7 - 2.4 mg/dL    Comment: Performed at [facility], [address] [place], [place] [address]  TSH     Status: None   Collection Time: 03/01/22  6:05 AM  Result Value Ref Range   TSH 3.099 0.350 - 4.500 uIU/mL    Comment: Performed by a 3rd Generation assay with a functional sensitivity of <=0.01 uIU/mL. Performed at [facility], [address] [place], [place] [address]   ECHOCARDIOGRAM COMPLETE     Status: None   Collection Time: 03/01/22  3:28 PM  Result Value Ref Range   Weight 2,289.26 oz   Height 72 in   BP 133/67 mmHg   Ao pk vel 3.64 m/s   AV Area VTI 0.86 cm2   AR max vel 0.84 cm2   AV Mean grad 25.5 mmHg   AV Peak grad 52.9 mmHg   S' Lateral 2.30 cm   AV Area mean vel 0.83 cm2   Area-P 1/2 3.81 cm2   P 1/2 time 428 msec   MV VTI 2.15 cm2   Est EF 60 - 65%     Radiology ECHOCARDIOGRAM COMPLETE  Result Date: 03/01/2022    ECHOCARDIOGRAM REPORT   Patient Name:   ASHAWN ORTEGON Date of Exam: 03/01/2022 Medical Rec #:  [id number]         Height:       72.0 in Accession #:  [id number]        Weight:       143.1 lb Date of Birth:  01-07-1929         BSA:          1.848 m Patient Age:    64 years          BP:           133/67 mmHg Patient Gender: M                 HR:           87 bpm. Exam Location:  [facility] Procedure: 2D Echo, Cardiac Doppler and Color Doppler Indications:     Stroke  History:          Patient has prior history of Echocardiogram examinations, most                  recent 10/10/2019. CHF, CAD, COPD, Arrythmias:Atrial                  Fibrillation; Risk Factors:Hypertension and Dyslipidemia.  Sonographer:     Wenda Low Referring Phys:  [id number] Val Riles Diagnosing Phys: Ida Rogue MD IMPRESSIONS  1. Left ventricular ejection fraction, by estimation, is 60 to 65%. The left ventricle has normal function. The left ventricle has no regional wall motion abnormalities. There is mild left ventricular hypertrophy. Left ventricular diastolic parameters are indeterminate.  2. Right ventricular systolic function is mildly reduced. The right ventricular size is moderately enlarged. There is severely elevated pulmonary artery systolic pressure. The estimated right ventricular systolic pressure is [id number] mmHg.  3. Left atrial size was moderately dilated.  4. Right atrial size was severely dilated.  5. The mitral valve is normal in structure. Mild to moderate mitral valve regurgitation. No evidence of mitral stenosis.  6. Tricuspid valve regurgitation is moderate.  7. The aortic valve is normal in structure. There is moderate calcification of the aortic valve. Aortic valve regurgitation is mild. Moderate aortic valve stenosis. Aortic valve area, by VTI measures 0.86 cm. Aortic valve mean gradient measures 25.5 mmHg. Aortic valve Vmax measures 3.64 m/s.  8. The inferior vena cava is normal in size with greater than 50% respiratory variability, suggesting right atrial pressure of 3 mmHg. FINDINGS  Left Ventricle: Left ventricular ejection fraction, by estimation, is 60 to 65%. The left ventricle has normal function. The left ventricle has no regional wall motion abnormalities. The left ventricular internal cavity size was normal in size. There is  mild left ventricular hypertrophy. Left ventricular diastolic parameters are indeterminate. Right Ventricle: The right ventricular size is moderately enlarged.  No increase in right ventricular wall thickness. Right ventricular systolic function is mildly reduced. There is severely elevated pulmonary artery systolic pressure. The tricuspid regurgitant velocity is 3.96 m/s, and with an assumed right atrial pressure of 15 mmHg, the estimated right ventricular systolic pressure is [id number] mmHg. Left Atrium: Left atrial size was moderately dilated. Right Atrium: Right atrial size was severely dilated. Pericardium: There is no evidence of pericardial effusion. Mitral Valve: The mitral valve is normal in structure. Mild to moderate mitral valve regurgitation. No evidence of mitral valve stenosis. MV peak gradient, 6.0 mmHg. The mean mitral valve gradient is 1.0 mmHg. Tricuspid Valve: The tricuspid valve is normal in structure. Tricuspid valve regurgitation is moderate . No evidence of tricuspid stenosis. Aortic Valve: The aortic valve is normal in structure. There is moderate calcification of the aortic valve. Aortic valve regurgitation is mild.  Aortic regurgitation PHT measures 428 msec. Moderate aortic stenosis is present. Aortic valve mean gradient measures 25.5 mmHg. Aortic valve peak gradient measures 52.9 mmHg. Aortic valve area, by VTI measures 0.86 cm. Pulmonic Valve: The pulmonic valve was normal in structure. Pulmonic valve regurgitation is not visualized. No evidence of pulmonic stenosis. Aorta: The aortic root is normal in size and structure. Venous: The inferior vena cava is normal in size with greater than 50% respiratory variability, suggesting right atrial pressure of 3 mmHg. IAS/Shunts: No atrial level shunt detected by color flow Doppler.  LEFT VENTRICLE PLAX 2D LVIDd:         3.70 cm LVIDs:         2.30 cm LV PW:         1.60 cm LV IVS:        1.30 cm LVOT diam:     2.00 cm LV SV:         64 LV SV Index:   35 LVOT Area:     3.14 cm  RIGHT VENTRICLE RV Basal diam:  5.40 cm RV Mid diam:    4.30 cm RV S prime:     14.90 cm/s LEFT ATRIUM             Index         RIGHT ATRIUM           Index LA diam:        5.20 cm 2.81 cm/m   RA Area:     30.50 cm LA Vol (A2C):   95.3 ml 51.57 ml/m  RA Volume:   120.00 ml 64.94 ml/m LA Vol (A4C):   90.8 ml 49.14 ml/m LA Biplane Vol: 96.2 ml 52.06 ml/m  AORTIC VALVE                     PULMONIC VALVE AV Area (Vmax):    0.84 cm      PV Vmax:       1.48 m/s AV Area (Vmean):   0.83 cm      PV Peak grad:  8.8 mmHg AV Area (VTI):     0.86 cm AV Vmax:           363.50 cm/s AV Vmean:          234.000 cm/s AV VTI:            0.745 m AV Peak Grad:      52.9 mmHg AV Mean Grad:      25.5 mmHg LVOT Vmax:         97.00 cm/s LVOT Vmean:        62.150 cm/s LVOT VTI:          0.204 m LVOT/AV VTI ratio: 0.27 AI PHT:            428 msec  AORTA Ao Root diam: 3.10 cm Ao Asc diam:  3.10 cm MITRAL VALVE               TRICUSPID VALVE MV Area (PHT): 3.81 cm    TR Peak grad:   62.7 mmHg MV Area VTI:   2.15 cm    TR Vmax:        396.00 cm/s MV Peak grad:  6.0 mmHg MV Mean grad:  1.0 mmHg    SHUNTS MV Vmax:       1.22 m/s    Systemic VTI:  0.20 m MV Vmean:      42.6 cm/s  Systemic Diam: 2.00 cm MV Decel Time: 199 msec MV E velocity: 93.30 cm/s Ida Rogue MD Electronically signed by Ida Rogue MD Signature Date/Time: 03/01/2022/3:40:49 PM    Final    MR BRAIN WO CONTRAST  Result Date: 03/01/2022 CLINICAL DATA:  Acute neurologic deficit EXAM: MRI HEAD WITHOUT CONTRAST MRA HEAD WITHOUT CONTRAST MRA NECK WITHOUT AND WITH CONTRAST TECHNIQUE: Multiplanar, multi-echo pulse sequences of the brain and surrounding structures were acquired without intravenous contrast. Angiographic images of the Circle of Willis were acquired using MRA technique without intravenous contrast. Angiographic images of the neck were acquired using MRA technique without and with intravenous contrast. Carotid stenosis measurements (when applicable) are obtained utilizing NASCET criteria, using the distal internal carotid diameter as the denominator. CONTRAST:  11m GADAVIST  GADOBUTROL 1 MMOL/ML IV SOLN COMPARISON:  None Available. FINDINGS: MRI HEAD FINDINGS Brain: No acute infarct, mass effect or extra-axial collection. Single left occipital chronic microhemorrhage. There is multifocal hyperintense T2-weighted signal within the white matter. Generalized volume loss. The midline structures are normal. Vascular: Normal flow voids. Skull and upper cervical spine: Normal marrow signal. Sinuses/Orbits: No acute or significant finding. Other: None. MRA HEAD FINDINGS POSTERIOR CIRCULATION: --Vertebral arteries: Normal --Inferior cerebellar arteries: Normal. --Basilar artery: Normal. --Superior cerebellar arteries: Normal. --Posterior cerebral arteries: Normal. ANTERIOR CIRCULATION: --Intracranial internal carotid arteries: Normal. --Anterior cerebral arteries (ACA): Normal. --Middle cerebral arteries (MCA): Normal. MRA NECK FINDINGS Aortic arch: Normal branching pattern.  Unremarkable. Right carotid system: Severe stenosis at the carotid bifurcation extending into the ICA with angiographic string sign. Stenosis is approximately 90%. Left carotid system: Atherosclerosis at the left carotid bifurcation without hemodynamically significant stenosis. Vertebral arteries: Normal Other: None IMPRESSION: 1. No acute intracranial abnormality. 2. No emergent large vessel occlusion. 3. Severe stenosis of the right internal carotid artery at the carotid bifurcation extending into the ICA with angiographic string sign. Stenosis is approximately 90%. Electronically Signed   By: KUlyses JarredM.D.   On: 03/01/2022 00:23   MR ANGIO HEAD WO CONTRAST  Result Date: 03/01/2022 CLINICAL DATA:  Acute neurologic deficit EXAM: MRI HEAD WITHOUT CONTRAST MRA HEAD WITHOUT CONTRAST MRA NECK WITHOUT AND WITH CONTRAST TECHNIQUE: Multiplanar, multi-echo pulse sequences of the brain and surrounding structures were acquired without intravenous contrast. Angiographic images of the Circle of Willis were acquired using MRA  technique without intravenous contrast. Angiographic images of the neck were acquired using MRA technique without and with intravenous contrast. Carotid stenosis measurements (when applicable) are obtained utilizing NASCET criteria, using the distal internal carotid diameter as the denominator. CONTRAST:  629mGADAVIST GADOBUTROL 1 MMOL/ML IV SOLN COMPARISON:  None Available. FINDINGS: MRI HEAD FINDINGS Brain: No acute infarct, mass effect or extra-axial collection. Single left occipital chronic microhemorrhage. There is multifocal hyperintense T2-weighted signal within the white matter. Generalized volume loss. The midline structures are normal. Vascular: Normal flow voids. Skull and upper cervical spine: Normal marrow signal. Sinuses/Orbits: No acute or significant finding. Other: None. MRA HEAD FINDINGS POSTERIOR CIRCULATION: --Vertebral arteries: Normal --Inferior cerebellar arteries: Normal. --Basilar artery: Normal. --Superior cerebellar arteries: Normal. --Posterior cerebral arteries: Normal. ANTERIOR CIRCULATION: --Intracranial internal carotid arteries: Normal. --Anterior cerebral arteries (ACA): Normal. --Middle cerebral arteries (MCA): Normal. MRA NECK FINDINGS Aortic arch: Normal branching pattern.  Unremarkable. Right carotid system: Severe stenosis at the carotid bifurcation extending into the ICA with angiographic string sign. Stenosis is approximately 90%. Left carotid system: Atherosclerosis at the left carotid bifurcation without hemodynamically significant stenosis. Vertebral arteries: Normal Other: None IMPRESSION: 1. No acute intracranial abnormality.  2. No emergent large vessel occlusion. 3. Severe stenosis of the right internal carotid artery at the carotid bifurcation extending into the ICA with angiographic string sign. Stenosis is approximately 90%. Electronically Signed   By: Ulyses Jarred M.D.   On: 03/01/2022 00:23   MR ANGIO NECK W WO CONTRAST  Result Date: 03/01/2022 CLINICAL DATA:   Acute neurologic deficit EXAM: MRI HEAD WITHOUT CONTRAST MRA HEAD WITHOUT CONTRAST MRA NECK WITHOUT AND WITH CONTRAST TECHNIQUE: Multiplanar, multi-echo pulse sequences of the brain and surrounding structures were acquired without intravenous contrast. Angiographic images of the Circle of Willis were acquired using MRA technique without intravenous contrast. Angiographic images of the neck were acquired using MRA technique without and with intravenous contrast. Carotid stenosis measurements (when applicable) are obtained utilizing NASCET criteria, using the distal internal carotid diameter as the denominator. CONTRAST:  31m GADAVIST GADOBUTROL 1 MMOL/ML IV SOLN COMPARISON:  None Available. FINDINGS: MRI HEAD FINDINGS Brain: No acute infarct, mass effect or extra-axial collection. Single left occipital chronic microhemorrhage. There is multifocal hyperintense T2-weighted signal within the white matter. Generalized volume loss. The midline structures are normal. Vascular: Normal flow voids. Skull and upper cervical spine: Normal marrow signal. Sinuses/Orbits: No acute or significant finding. Other: None. MRA HEAD FINDINGS POSTERIOR CIRCULATION: --Vertebral arteries: Normal --Inferior cerebellar arteries: Normal. --Basilar artery: Normal. --Superior cerebellar arteries: Normal. --Posterior cerebral arteries: Normal. ANTERIOR CIRCULATION: --Intracranial internal carotid arteries: Normal. --Anterior cerebral arteries (ACA): Normal. --Middle cerebral arteries (MCA): Normal. MRA NECK FINDINGS Aortic arch: Normal branching pattern.  Unremarkable. Right carotid system: Severe stenosis at the carotid bifurcation extending into the ICA with angiographic string sign. Stenosis is approximately 90%. Left carotid system: Atherosclerosis at the left carotid bifurcation without hemodynamically significant stenosis. Vertebral arteries: Normal Other: None IMPRESSION: 1. No acute intracranial abnormality. 2. No emergent large vessel  occlusion. 3. Severe stenosis of the right internal carotid artery at the carotid bifurcation extending into the ICA with angiographic string sign. Stenosis is approximately 90%. Electronically Signed   By: KUlyses JarredM.D.   On: 03/01/2022 00:23   CT HEAD WO CONTRAST  Result Date: 02/28/2022 CLINICAL DATA:  Neuro deficit, acute, stroke suspected. Left eye vision loss x 4 days. EXAM: CT HEAD WITHOUT CONTRAST TECHNIQUE: Contiguous axial images were obtained from the base of the skull through the vertex without intravenous contrast. RADIATION DOSE REDUCTION: This exam was performed according to the departmental dose-optimization program which includes automated exposure control, adjustment of the mA and/or kV according to patient size and/or use of iterative reconstruction technique. COMPARISON:  Head CT 06/25/2011 FINDINGS: Brain: There is no evidence of an acute infarct, intracranial hemorrhage, mass, midline shift, or extra-axial fluid collection. Patchy hypodensities in the cerebral white matter bilaterally have mildly progressed and are nonspecific but compatible with moderate chronic small vessel ischemic disease. There is mild cerebral atrophy. Vascular: Calcified atherosclerosis at the skull base. No hyperdense vessel. Skull: No acute fracture or suspicious osseous lesion. Sinuses/Orbits: No significant inflammatory changes in the included paranasal sinuses. Clear mastoid air cells. Bilateral cataract extraction. Other: None. IMPRESSION: 1. No evidence of acute intracranial abnormality. 2. Moderate chronic small vessel ischemic disease. Electronically Signed   By: ALogan BoresM.D.   On: 02/28/2022 13:02    Assessment/Plan  PVD (peripheral vascular disease) ([place] Has had multiple previous procedures and we performed surgery a couple of years ago.  This has not been checked that I can see in over a year.  After we deal with his carotid issues,  should come back for ABIs.  Hypertension blood pressure  control important in reducing the progression of atherosclerotic disease. On appropriate oral medications.   HLD (hyperlipidemia) lipid control important in reducing the progression of atherosclerotic disease. Continue statin therapy   COPD (chronic obstructive pulmonary disease) (HCC) Severe and on chronic oxygen therapy.  Would be high risk for open surgical therapy and would benefit from percutaneous carotid intervention with stenting.  Carotid stenosis I have independently reviewed his MR angiogram.  MRI is not nearly as accurate for evaluation of carotid stenosis, but this does suggest a very high-grade stenosis in the right internal carotid artery in the cervical portion of 90 or greater percent.  Given his severe comorbidities most notably his severe COPD on oxygen, he is not a great surgical candidate but carotid stenting would be an option.  He has stroke with this being the most likely cause.  Given this scenario, a carotid angiogram would be appropriate to ensure that significant disease is present and if it is planning to proceed with carotid stenting at that time.  I discussed the risks and benefits of carotid stenting versus medical management.  I had a long discussion with he as well as his daughter on the phone today.  This is a critical and life threatening situation.    [name], MD  03/08/2022 3:07 PM    This note was created with Dragon medical transcription system.  Any errors from dictation are purely unintentional

## 2022-03-08 NOTE — Assessment & Plan Note (Signed)
I have independently reviewed his MR angiogram.  MRI is not nearly as accurate for evaluation of carotid stenosis, but this does suggest a very high-grade stenosis in the right internal carotid artery in the cervical portion of 90 or greater percent.  Given his severe comorbidities most notably his severe COPD on oxygen, he is not a great surgical candidate but carotid stenting would be an option.  He has stroke with this being the most likely cause.  Given this scenario, a carotid angiogram would be appropriate to ensure that significant disease is present and if it is planning to proceed with carotid stenting at that time.  I discussed the risks and benefits of carotid stenting versus medical management.  I had a long discussion with he as well as his daughter on the phone today.  This is a critical and life threatening situation.

## 2022-03-08 NOTE — Assessment & Plan Note (Signed)
lipid control important in reducing the progression of atherosclerotic disease. Continue statin therapy  

## 2022-03-08 NOTE — Assessment & Plan Note (Signed)
Has had multiple previous procedures and we performed surgery a couple of years ago.  This has not been checked that I can see in over a year.  After we deal with his carotid issues, should come back for ABIs.

## 2022-03-08 NOTE — Progress Notes (Signed)
MRN : [id number]  [name] is a 89 y.o. (1928-03-31) male who presents with chief complaint of  Chief Complaint  Patient presents with   Follow-up    Follow up Consult  for Severe stenosis of the right internal carotid artery at thecarotid bifurcation extending into the ICA with angiographic string sign. Stenosis is approximately 90%.  .  History of Present Illness: Patient returns today in follow up with a new referral for carotid stenosis.  He had a recent stroke and as part of his workup he had an MRI and an MR angiogram. I have independently reviewed his MR angiogram.  MRI is not nearly as accurate for evaluation of carotid stenosis, but this does suggest a very high-grade stenosis in the right internal carotid artery in the cervical portion of 90 or greater percent.  Given this finding, he is referred for further evaluation and treatment.  He is a longstanding patient of ours and we have done much lower extremity work over the years but this has not been checked in the last year or so either.  He has oxygen dependent COPD.  He has other medical comorbidities as listed below.  He is currently on Eliquis, aspirin, and Lipitor  Current Outpatient Medications  Medication Sig Dispense Refill   acetaminophen (TYLENOL) 500 MG tablet Take 500 mg by mouth every 6 (six) hours as needed for fever or pain.     alendronate (FOSAMAX) 70 MG tablet Take 70 mg by mouth once a week. Sunday morning with full glass of water and sit up do not lay down     apixaban (ELIQUIS) 2.5 MG TABS tablet Take 2.5 mg by mouth 2 (two) times daily.     ascorbic acid (VITAMIN C) 250 MG tablet Take 1 tablet (250 mg total) by mouth 2 (two) times daily. 60 tablet 6   aspirin EC 81 MG EC tablet Take 1 tablet (81 mg total) by mouth daily at 6 (six) AM. Swallow whole. 90 tablet 3   atorvastatin (LIPITOR) 40 MG tablet Take 1 tablet (40 mg total) by mouth daily. 30 tablet 2   Calcium Carb-Cholecalciferol (CALCIUM  600/VITAMIN D PO) Take by mouth.     CARTIA XT 300 MG 24 hr capsule Take 300 mg by mouth daily.     ciclopirox (LOPROX) 0.77 % cream Apply topically 2 (two) times daily. Apply to feet BID x 1 month. Then QW thereafter. 90 g 3   ciclopirox (LOPROX) 0.77 % SUSP Apply once daily to affected fingernails 30 mL 5   Fluticasone-Umeclidin-Vilant (TRELEGY ELLIPTA) 100-62.5-25 MCG/INH AEPB Inhale into the lungs.     metoprolol tartrate (LOPRESSOR) 50 MG tablet Take 50 mg by mouth 2 (two) times daily.     Multiple Vitamin (MULTIVITAMIN WITH MINERALS) TABS tablet Take 1 tablet by mouth daily. 90 tablet 3   mupirocin ointment (BACTROBAN) 2 % Apply 1 Application topically daily. Apply to wounds once daily until healed. 22 g 2   potassium chloride (KLOR-CON) 10 MEQ tablet Take 2 tablets (20 mEq total) by mouth daily. (Patient taking differently: Take 10-20 mEq by mouth daily.) 60 tablet 5   torsemide (DEMADEX) 20 MG tablet Take 10m on M, W, F and 275mon Tu, Thur,Sat & Sunday (Patient taking differently: Take 20-40 mg by mouth daily. Take 4083mn M, F and 50m12m Tu, Thur, Sat & Sunday) 40 tablet 3   Vitamin D, Ergocalciferol, (DRISDOL) 1.25 MG (50000 UNIT) CAPS capsule Take 1 capsule (50,000  Units total) by mouth every 7 (seven) days. 5 capsule 0   Wheat Dextrin (BENEFIBER PO) Take by mouth. 2 teaspoons twice a day     No current facility-administered medications for this visit.    Past Medical History:  Diagnosis Date   (HFpEF) heart failure with preserved ejection fraction (HCC)    Aortic atherosclerosis ([place])    Basal cell carcinoma 09/13/2021   Left cheek. EDC   Cardiac murmur    Grade I/VI medium pitched mid systolic blowing at lower LSB   COPD (chronic obstructive pulmonary disease) (HCC)    Coronary artery disease    Dependence on supplemental oxygen    HLD (hyperlipidemia)    Hypertension    Paroxysmal atrial fibrillation (HCC)    PVD (peripheral vascular disease) (HCC)    Skin cancer     nose and ear - tx with Mohs   Thoracic aortic ectasia (HCC)    a.) measured 3.6 x 3.3 cm on 10/08/2019    Past Surgical History:  Procedure Laterality Date   COLONOSCOPY  2007   ENDARTERECTOMY FEMORAL Right 08/09/2020   Procedure: RIGHT COMMON AND PROFUNDA FEMORAL ENDARTERECTOMY;  Surgeon: Algernon Huxley, MD;  Location: [facility];  Service: Vascular;  Laterality: Right;   EYE SURGERY Bilateral 2005   LOWER EXTREMITY ANGIOGRAPHY Right 07/05/2020   Procedure: LOWER EXTREMITY ANGIOGRAPHY;  Surgeon: Algernon Huxley, MD;  Location: [facility];  Service: Cardiovascular;  Laterality: Right;   Renal Bypass Surgery Bilateral 2000     Social History   Tobacco Use   Smoking status: Former    Packs/day: 1.00    Years: 30.00    Total pack years: 30.00    Types: Cigarettes    Quit date: 69    Years since quitting: 27.1   Smokeless tobacco: Never  Vaping Use   Vaping Use: Never used  Substance Use Topics   Alcohol use: Never   Drug use: Never      Family History No bleeding disorders, clotting disorders, autoimmune diseases or aneurysms  Allergies  Allergen Reactions   Bee Venom Anaphylaxis     REVIEW OF SYSTEMS (Negative unless checked)  Constitutional: []$ Weight loss  []$ Fever  []$ Chills Cardiac: []$ Chest pain   []$ Chest pressure   []$ Palpitations   []$ Shortness of breath when laying flat   [x]$ Shortness of breath at rest   [x]$ Shortness of breath with exertion. Vascular:  []$ Pain in legs with walking   []$ Pain in legs at rest   []$ Pain in legs when laying flat   []$ Claudication   []$ Pain in feet when walking  []$ Pain in feet at rest  []$ Pain in feet when laying flat   []$ History of DVT   []$ Phlebitis   []$ Swelling in legs   []$ Varicose veins   []$ Non-healing ulcers Pulmonary:   [x]$ Uses home oxygen   []$ Productive cough   []$ Hemoptysis   []$ Wheeze  [x]$ COPD   []$ Asthma Neurologic:  []$ Dizziness  []$ Blackouts   []$ Seizures   [x]$ History of stroke   []$ History of TIA  []$ Aphasia   []$ Temporary blindness    []$ Dysphagia   []$ Weakness or numbness in arms   []$ Weakness or numbness in legs Musculoskeletal:  [x]$ Arthritis   []$ Joint swelling   [x]$ Joint pain   []$ Low back pain Hematologic:  []$ Easy bruising  []$ Easy bleeding   []$ Hypercoagulable state   []$ Anemic   Gastrointestinal:  []$ Blood in stool   []$ Vomiting blood  []$ Gastroesophageal reflux/heartburn   []$ Abdominal pain Genitourinary:  []$ Chronic kidney disease   []$ Difficult urination  []$ Frequent  urination  []$ Burning with urination   []$ Hematuria Skin:  []$ Rashes   []$ Ulcers   []$ Wounds Psychological:  []$ History of anxiety   []$  History of major depression.  Physical Examination  BP (!) 128/58 (BP Location: Right Arm)   Pulse 78   Resp 18  Gen:   elderly and debilitated appearing. NAD Head: [place]/AT, + temporalis wasting. Ear/Nose/Throat: Hearing grossly intact, nares w/o erythema or drainage Eyes: Conjunctiva clear. Sclera non-icteric Neck: Supple.  Trachea midline Pulmonary:  Good air movement, no use of accessory muscles.  Cardiac: irregular Vascular:  Vessel Right Left  Radial Palpable Palpable                          PT 1+ Palpable 1+ Palpable  DP Not Palpable Not Palpable   Gastrointestinal: soft, non-tender/non-distended. No guarding/reflex.  Musculoskeletal: M/S 5/5 throughout.  No deformity or atrophy. No edema. Neurologic: Sensation grossly intact in extremities.  Symmetrical.  Speech is fluent.  Psychiatric: Judgment intact, Mood & affect appropriate for pt's clinical situation. Dermatologic: No rashes or ulcers noted.  No cellulitis or open wounds.      Labs Recent Results (from the past 2160 hour(s))  Basic metabolic panel     Status: Abnormal   Collection Time: 01/23/22 12:23 PM  Result Value Ref Range   Sodium 140 135 - 145 mmol/L   Potassium 4.0 3.5 - 5.1 mmol/L   Chloride 96 (L) 98 - 111 mmol/L   CO2 35 (H) 22 - 32 mmol/L   Glucose, Bld 104 (H) 70 - 99 mg/dL    Comment: Glucose reference range applies only to samples  taken after fasting for at least 8 hours.   BUN 48 (H) 8 - 23 mg/dL   Creatinine, Ser 1.36 (H) 0.61 - 1.24 mg/dL   Calcium 11.3 (H) 8.9 - 10.3 mg/dL   GFR, Estimated 49 (L) >60 mL/min    Comment: (NOTE) Calculated using the CKD-EPI Creatinine Equation (2021)    Anion gap 9 5 - 15    Comment: Performed at [facility], [address], [place], [place] [address]  Urine Drug Screen, Qualitative     Status: None   Collection Time: 02/28/22 12:12 PM  Result Value Ref Range   Tricyclic, Ur Screen NONE DETECTED NONE DETECTED   Amphetamines, Ur Screen NONE DETECTED NONE DETECTED   MDMA (Ecstasy)Ur Screen NONE DETECTED NONE DETECTED   Cocaine Metabolite,Ur [place] NONE DETECTED NONE DETECTED   Opiate, Ur Screen NONE DETECTED NONE DETECTED   Phencyclidine (PCP) Ur S NONE DETECTED NONE DETECTED   Cannabinoid 50 Ng, Ur [place] NONE DETECTED NONE DETECTED   Barbiturates, Ur Screen NONE DETECTED NONE DETECTED   Benzodiazepine, Ur Scrn NONE DETECTED NONE DETECTED   Methadone Scn, Ur NONE DETECTED NONE DETECTED    Comment: (NOTE) Tricyclics + metabolites, urine    Cutoff 1000 ng/mL Amphetamines + metabolites, urine  Cutoff 1000 ng/mL MDMA (Ecstasy), urine              Cutoff 500 ng/mL Cocaine Metabolite, urine          Cutoff 300 ng/mL Opiate + metabolites, urine        Cutoff 300 ng/mL Phencyclidine (PCP), urine         Cutoff 25 ng/mL Cannabinoid, urine                 Cutoff 50 ng/mL Barbiturates + metabolites, urine  Cutoff 200 ng/mL Benzodiazepine, urine  Cutoff 200 ng/mL Methadone, urine                   Cutoff 300 ng/mL  The urine drug screen provides only a preliminary, unconfirmed analytical test result and should not be used for non-medical purposes. Clinical consideration and professional judgment should be applied to any positive drug screen result due to possible interfering substances. A more specific alternate chemical method must be used in order to obtain a  confirmed analytical result. Gas chromatography / mass spectrometry (GC/MS) is the preferred confirm atory method. Performed at [facility], [address] [place], [place] [address]   Ethanol     Status: None   Collection Time: 02/28/22 12:26 PM  Result Value Ref Range   Alcohol, Ethyl (B) <10 <10 mg/dL    Comment: (NOTE) Lowest detectable limit for serum alcohol is 10 mg/dL.  For medical purposes only. Performed at [facility], [address] [place], [place] [address]   Protime-INR     Status: Abnormal   Collection Time: 02/28/22 12:26 PM  Result Value Ref Range   Prothrombin Time 17.9 (H) 11.4 - 15.2 seconds   INR 1.5 (H) 0.8 - 1.2    Comment: (NOTE) INR goal varies based on device and disease states. Performed at [facility], [address] [place], [place] [address]   APTT     Status: None   Collection Time: 02/28/22 12:26 PM  Result Value Ref Range   aPTT 32 24 - 36 seconds    Comment: Performed at [facility], [address] [place], [place] [address]  Comprehensive metabolic panel     Status: Abnormal   Collection Time: 02/28/22 12:26 PM  Result Value Ref Range   Sodium 137 135 - 145 mmol/L   Potassium 4.0 3.5 - 5.1 mmol/L   Chloride 96 (L) 98 - 111 mmol/L   CO2 30 22 - 32 mmol/L   Glucose, Bld 106 (H) 70 - 99 mg/dL    Comment: Glucose reference range applies only to samples taken after fasting for at least 8 hours.   BUN 48 (H) 8 - 23 mg/dL   Creatinine, Ser 1.36 (H) 0.61 - 1.24 mg/dL   Calcium 10.5 (H) 8.9 - 10.3 mg/dL   Total Protein 7.3 6.5 - 8.1 g/dL   Albumin 3.9 3.5 - 5.0 g/dL   AST 28 15 - 41 U/L   ALT 18 0 - 44 U/L   Alkaline Phosphatase 57 38 - 126 U/L   Total Bilirubin 0.9 0.3 - 1.2 mg/dL   GFR, Estimated 49 (L) >60 mL/min    Comment: (NOTE) Calculated using the CKD-EPI Creatinine Equation (2021)    Anion gap 11 5 - 15    Comment: Performed at [facility], [address]  [place],  [address]  CBC with Differential     Status: Abnormal   Collection Time: 02/28/22 12:26 PM  Result Value Ref Range   WBC 11.2 (H) 4.0 - 10.5 K/uL   RBC 3.47 (L) 4.22 - 5.81 MIL/uL   Hemoglobin 10.7 (L) 13.0 - 17.0 g/dL   HCT 33.2 (L) 39.0 - 52.0 %   MCV 95.7 80.0 - 100.0 fL   MCH 30.8 26.0 - 34.0 pg   MCHC 32.2 30.0 - 36.0 g/dL   RDW 14.2 11.5 - 15.5 %   Platelets 252 150 - 400 K/uL   nRBC 0.0 0.0 - 0.2 %   Neutrophils Relative % 67 %  Neutro Abs 7.5 1.7 - 7.7 K/uL   Lymphocytes Relative 20 %   Lymphs Abs 2.3 0.7 - 4.0 K/uL   Monocytes Relative 10 %   Monocytes Absolute 1.2 (H) 0.1 - 1.0 K/uL   Eosinophils Relative 1 %   Eosinophils Absolute 0.2 0.0 - 0.5 K/uL   Basophils Relative 1 %   Basophils Absolute 0.1 0.0 - 0.1 K/uL   Immature Granulocytes 1 %   Abs Immature Granulocytes 0.06 0.00 - 0.07 K/uL    Comment: Performed at [facility], [address] [place], [place] [address]  Sedimentation rate     Status: Abnormal   Collection Time: 02/28/22 12:26 PM  Result Value Ref Range   Sed Rate 41 (H) 0 - 20 mm/hr    Comment: Performed at [facility], [address] [place], [place] [address]  C-reactive protein     Status: Abnormal   Collection Time: 02/28/22 12:26 PM  Result Value Ref Range   CRP 1.0 (H) <1.0 mg/dL    Comment: Performed at [facility] [address], [place], [place] [address]  Lipid panel     Status: None   Collection Time: 02/28/22 12:26 PM  Result Value Ref Range   Cholesterol 158 0 - 200 mg/dL   Triglycerides 55 <150 mg/dL   HDL 56 >40 mg/dL   Total CHOL/HDL Ratio 2.8 RATIO   VLDL 11 0 - 40 mg/dL   LDL Cholesterol 91 0 - 99 mg/dL    Comment:        Total Cholesterol/HDL:CHD Risk Coronary Heart Disease Risk Table                     Men   Women  1/2 Average Risk   3.4   3.3  Average Risk       5.0   4.4  2 X Average Risk   9.6   7.1  3 X Average Risk  23.4   11.0        Use the calculated Patient  Ratio above and the CHD Risk Table to determine the patient's CHD Risk.        ATP III CLASSIFICATION (LDL):  <100     mg/dL   Optimal  100-129  mg/dL   Near or Above                    Optimal  130-159  mg/dL   Borderline  160-189  mg/dL   High  >190     mg/dL   Very High Performed at [facility], [address] [place], [place] [address]   Hemoglobin A1c     Status: Abnormal   Collection Time: 02/28/22 12:26 PM  Result Value Ref Range   Hgb A1c MFr Bld 6.3 (H) 4.8 - 5.6 %    Comment: (NOTE) Pre diabetes:          5.7%-6.4%  Diabetes:              >6.4%  Glycemic control for   <7.0% adults with diabetes    Mean Plasma Glucose 134.11 mg/dL    Comment: Performed at [facility] [address], [place],  [address]  CBC with Differential/Platelet     Status: Abnormal   Collection Time: 03/01/22  6:05 AM  Result Value Ref Range   WBC 11.4 (H) 4.0 - 10.5 K/uL   RBC 3.46 (L) 4.22 - 5.81 MIL/uL  Hemoglobin 10.7 (L) 13.0 - 17.0 g/dL   HCT 32.9 (L) 39.0 - 52.0 %   MCV 95.1 80.0 - 100.0 fL   MCH 30.9 26.0 - 34.0 pg   MCHC 32.5 30.0 - 36.0 g/dL   RDW 14.3 11.5 - 15.5 %   Platelets 238 150 - 400 K/uL   nRBC 0.0 0.0 - 0.2 %   Neutrophils Relative % 65 %   Neutro Abs 7.6 1.7 - 7.7 K/uL   Lymphocytes Relative 17 %   Lymphs Abs 2.0 0.7 - 4.0 K/uL   Monocytes Relative 14 %   Monocytes Absolute 1.5 (H) 0.1 - 1.0 K/uL   Eosinophils Relative 2 %   Eosinophils Absolute 0.2 0.0 - 0.5 K/uL   Basophils Relative 1 %   Basophils Absolute 0.1 0.0 - 0.1 K/uL   Immature Granulocytes 1 %   Abs Immature Granulocytes 0.07 0.00 - 0.07 K/uL    Comment: Performed at [facility], [address] [place], [place] [id number]  Basic metabolic panel     Status: Abnormal   Collection Time: 03/01/22  6:05 AM  Result Value Ref Range   Sodium 140 135 - 145 mmol/L   Potassium 3.6 3.5 - 5.1 mmol/L   Chloride 98 98 - 111 mmol/L   CO2 34 (H) 22 - 32 mmol/L   Glucose,  Bld 86 70 - 99 mg/dL    Comment: Glucose reference range applies only to samples taken after fasting for at least 8 hours.   BUN 41 (H) 8 - 23 mg/dL   Creatinine, Ser 1.30 (H) 0.61 - 1.24 mg/dL   Calcium 9.6 8.9 - 10.3 mg/dL   GFR, Estimated 52 (L) >60 mL/min    Comment: (NOTE) Calculated using the CKD-EPI Creatinine Equation (2021)    Anion gap 8 5 - 15    Comment: Performed at [facility], [address] [place], [place] [address]  Magnesium     Status: None   Collection Time: 03/01/22  6:05 AM  Result Value Ref Range   Magnesium 2.0 1.7 - 2.4 mg/dL    Comment: Performed at [facility], [address] [place], [place] [address]  TSH     Status: None   Collection Time: 03/01/22  6:05 AM  Result Value Ref Range   TSH 3.099 0.350 - 4.500 uIU/mL    Comment: Performed by a 3rd Generation assay with a functional sensitivity of <=0.01 uIU/mL. Performed at [facility] [place], [address] [place], [place] [address]   ECHOCARDIOGRAM COMPLETE     Status: None   Collection Time: 03/01/22  3:28 PM  Result Value Ref Range   Weight 2,289.26 oz   Height 72 in   BP 133/67 mmHg   Ao pk vel 3.64 m/s   AV Area VTI 0.86 cm2   AR max vel 0.84 cm2   AV Mean grad 25.5 mmHg   AV Peak grad 52.9 mmHg   S' Lateral 2.30 cm   AV Area mean vel 0.83 cm2   Area-P 1/2 3.81 cm2   P 1/2 time 428 msec   MV VTI 2.15 cm2   Est EF 60 - 65%     Radiology ECHOCARDIOGRAM COMPLETE  Result Date: 03/01/2022    ECHOCARDIOGRAM REPORT   Patient Name:   [name] Date of Exam: 03/01/2022 Medical Rec #:  [id number]         Height:       72.0 in Accession #:  [id number]        Weight:       143.1 lb Date of Birth:  [birth year]         BSA:          1.848 m Patient Age:    3 years          BP:           133/67 mmHg Patient Gender: M                 HR:           87 bpm. Exam Location:  [facility] Procedure: 2D Echo, Cardiac Doppler and Color Doppler Indications:     Stroke  History:          Patient has prior history of Echocardiogram examinations, most                  recent 10/10/2019. CHF, CAD, COPD, Arrythmias:Atrial                  Fibrillation; Risk Factors:Hypertension and Dyslipidemia.  Sonographer:     Wenda Low Referring Phys:  [id number] Val Riles Diagnosing Phys: Ida Rogue MD IMPRESSIONS  1. Left ventricular ejection fraction, by estimation, is 60 to 65%. The left ventricle has normal function. The left ventricle has no regional wall motion abnormalities. There is mild left ventricular hypertrophy. Left ventricular diastolic parameters are indeterminate.  2. Right ventricular systolic function is mildly reduced. The right ventricular size is moderately enlarged. There is severely elevated pulmonary artery systolic pressure. The estimated right ventricular systolic pressure is [id number] mmHg.  3. Left atrial size was moderately dilated.  4. Right atrial size was severely dilated.  5. The mitral valve is normal in structure. Mild to moderate mitral valve regurgitation. No evidence of mitral stenosis.  6. Tricuspid valve regurgitation is moderate.  7. The aortic valve is normal in structure. There is moderate calcification of the aortic valve. Aortic valve regurgitation is mild. Moderate aortic valve stenosis. Aortic valve area, by VTI measures 0.86 cm. Aortic valve mean gradient measures 25.5 mmHg. Aortic valve Vmax measures 3.64 m/s.  8. The inferior vena cava is normal in size with greater than 50% respiratory variability, suggesting right atrial pressure of 3 mmHg. FINDINGS  Left Ventricle: Left ventricular ejection fraction, by estimation, is 60 to 65%. The left ventricle has normal function. The left ventricle has no regional wall motion abnormalities. The left ventricular internal cavity size was normal in size. There is  mild left ventricular hypertrophy. Left ventricular diastolic parameters are indeterminate. Right Ventricle: The right ventricular size is moderately enlarged.  No increase in right ventricular wall thickness. Right ventricular systolic function is mildly reduced. There is severely elevated pulmonary artery systolic pressure. The tricuspid regurgitant velocity is 3.96 m/s, and with an assumed right atrial pressure of 15 mmHg, the estimated right ventricular systolic pressure is [id number] mmHg. Left Atrium: Left atrial size was moderately dilated. Right Atrium: Right atrial size was severely dilated. Pericardium: There is no evidence of pericardial effusion. Mitral Valve: The mitral valve is normal in structure. Mild to moderate mitral valve regurgitation. No evidence of mitral valve stenosis. MV peak gradient, 6.0 mmHg. The mean mitral valve gradient is 1.0 mmHg. Tricuspid Valve: The tricuspid valve is normal in structure. Tricuspid valve regurgitation is moderate . No evidence of tricuspid stenosis. Aortic Valve: The aortic valve is normal in structure. There is moderate calcification of the aortic valve. Aortic valve regurgitation is mild.  Aortic regurgitation PHT measures 428 msec. Moderate aortic stenosis is present. Aortic valve mean gradient measures 25.5 mmHg. Aortic valve peak gradient measures 52.9 mmHg. Aortic valve area, by VTI measures 0.86 cm. Pulmonic Valve: The pulmonic valve was normal in structure. Pulmonic valve regurgitation is not visualized. No evidence of pulmonic stenosis. Aorta: The aortic root is normal in size and structure. Venous: The inferior vena cava is normal in size with greater than 50% respiratory variability, suggesting right atrial pressure of 3 mmHg. IAS/Shunts: No atrial level shunt detected by color flow Doppler.  LEFT VENTRICLE PLAX 2D LVIDd:         3.70 cm LVIDs:         2.30 cm LV PW:         1.60 cm LV IVS:        1.30 cm LVOT diam:     2.00 cm LV SV:         64 LV SV Index:   35 LVOT Area:     3.14 cm  RIGHT VENTRICLE RV Basal diam:  5.40 cm RV Mid diam:    4.30 cm RV S prime:     14.90 cm/s LEFT ATRIUM             Index         RIGHT ATRIUM           Index LA diam:        5.20 cm 2.81 cm/m   RA Area:     30.50 cm LA Vol (A2C):   95.3 ml 51.57 ml/m  RA Volume:   120.00 ml 64.94 ml/m LA Vol (A4C):   90.8 ml 49.14 ml/m LA Biplane Vol: 96.2 ml 52.06 ml/m  AORTIC VALVE                     PULMONIC VALVE AV Area (Vmax):    0.84 cm      PV Vmax:       1.48 m/s AV Area (Vmean):   0.83 cm      PV Peak grad:  8.8 mmHg AV Area (VTI):     0.86 cm AV Vmax:           363.50 cm/s AV Vmean:          234.000 cm/s AV VTI:            0.745 m AV Peak Grad:      52.9 mmHg AV Mean Grad:      25.5 mmHg LVOT Vmax:         97.00 cm/s LVOT Vmean:        62.150 cm/s LVOT VTI:          0.204 m LVOT/AV VTI ratio: 0.27 AI PHT:            428 msec  AORTA Ao Root diam: 3.10 cm Ao Asc diam:  3.10 cm MITRAL VALVE               TRICUSPID VALVE MV Area (PHT): 3.81 cm    TR Peak grad:   62.7 mmHg MV Area VTI:   2.15 cm    TR Vmax:        396.00 cm/s MV Peak grad:  6.0 mmHg MV Mean grad:  1.0 mmHg    SHUNTS MV Vmax:       1.22 m/s    Systemic VTI:  0.20 m MV Vmean:      42.6 cm/s  Systemic Diam: 2.00 cm MV Decel Time: 199 msec MV E velocity: 93.30 cm/s Ida Rogue MD Electronically signed by Ida Rogue MD Signature Date/Time: 03/01/2022/3:40:49 PM    Final    MR BRAIN WO CONTRAST  Result Date: 03/01/2022 CLINICAL DATA:  Acute neurologic deficit EXAM: MRI HEAD WITHOUT CONTRAST MRA HEAD WITHOUT CONTRAST MRA NECK WITHOUT AND WITH CONTRAST TECHNIQUE: Multiplanar, multi-echo pulse sequences of the brain and surrounding structures were acquired without intravenous contrast. Angiographic images of the Circle of Willis were acquired using MRA technique without intravenous contrast. Angiographic images of the neck were acquired using MRA technique without and with intravenous contrast. Carotid stenosis measurements (when applicable) are obtained utilizing NASCET criteria, using the distal internal carotid diameter as the denominator. CONTRAST:  28m GADAVIST  GADOBUTROL 1 MMOL/ML IV SOLN COMPARISON:  None Available. FINDINGS: MRI HEAD FINDINGS Brain: No acute infarct, mass effect or extra-axial collection. Single left occipital chronic microhemorrhage. There is multifocal hyperintense T2-weighted signal within the white matter. Generalized volume loss. The midline structures are normal. Vascular: Normal flow voids. Skull and upper cervical spine: Normal marrow signal. Sinuses/Orbits: No acute or significant finding. Other: None. MRA HEAD FINDINGS POSTERIOR CIRCULATION: --Vertebral arteries: Normal --Inferior cerebellar arteries: Normal. --Basilar artery: Normal. --Superior cerebellar arteries: Normal. --Posterior cerebral arteries: Normal. ANTERIOR CIRCULATION: --Intracranial internal carotid arteries: Normal. --Anterior cerebral arteries (ACA): Normal. --Middle cerebral arteries (MCA): Normal. MRA NECK FINDINGS Aortic arch: Normal branching pattern.  Unremarkable. Right carotid system: Severe stenosis at the carotid bifurcation extending into the ICA with angiographic string sign. Stenosis is approximately 90%. Left carotid system: Atherosclerosis at the left carotid bifurcation without hemodynamically significant stenosis. Vertebral arteries: Normal Other: None IMPRESSION: 1. No acute intracranial abnormality. 2. No emergent large vessel occlusion. 3. Severe stenosis of the right internal carotid artery at the carotid bifurcation extending into the ICA with angiographic string sign. Stenosis is approximately 90%. Electronically Signed   By: KUlyses JarredM.D.   On: 03/01/2022 00:23   MR ANGIO HEAD WO CONTRAST  Result Date: 03/01/2022 CLINICAL DATA:  Acute neurologic deficit EXAM: MRI HEAD WITHOUT CONTRAST MRA HEAD WITHOUT CONTRAST MRA NECK WITHOUT AND WITH CONTRAST TECHNIQUE: Multiplanar, multi-echo pulse sequences of the brain and surrounding structures were acquired without intravenous contrast. Angiographic images of the Circle of Willis were acquired using MRA  technique without intravenous contrast. Angiographic images of the neck were acquired using MRA technique without and with intravenous contrast. Carotid stenosis measurements (when applicable) are obtained utilizing NASCET criteria, using the distal internal carotid diameter as the denominator. CONTRAST:  612mGADAVIST GADOBUTROL 1 MMOL/ML IV SOLN COMPARISON:  None Available. FINDINGS: MRI HEAD FINDINGS Brain: No acute infarct, mass effect or extra-axial collection. Single left occipital chronic microhemorrhage. There is multifocal hyperintense T2-weighted signal within the white matter. Generalized volume loss. The midline structures are normal. Vascular: Normal flow voids. Skull and upper cervical spine: Normal marrow signal. Sinuses/Orbits: No acute or significant finding. Other: None. MRA HEAD FINDINGS POSTERIOR CIRCULATION: --Vertebral arteries: Normal --Inferior cerebellar arteries: Normal. --Basilar artery: Normal. --Superior cerebellar arteries: Normal. --Posterior cerebral arteries: Normal. ANTERIOR CIRCULATION: --Intracranial internal carotid arteries: Normal. --Anterior cerebral arteries (ACA): Normal. --Middle cerebral arteries (MCA): Normal. MRA NECK FINDINGS Aortic arch: Normal branching pattern.  Unremarkable. Right carotid system: Severe stenosis at the carotid bifurcation extending into the ICA with angiographic string sign. Stenosis is approximately 90%. Left carotid system: Atherosclerosis at the left carotid bifurcation without hemodynamically significant stenosis. Vertebral arteries: Normal Other: None IMPRESSION: 1. No acute intracranial abnormality.  2. No emergent large vessel occlusion. 3. Severe stenosis of the right internal carotid artery at the carotid bifurcation extending into the ICA with angiographic string sign. Stenosis is approximately 90%. Electronically Signed   By: Ulyses Jarred M.D.   On: 03/01/2022 00:23   MR ANGIO NECK W WO CONTRAST  Result Date: 03/01/2022 CLINICAL DATA:   Acute neurologic deficit EXAM: MRI HEAD WITHOUT CONTRAST MRA HEAD WITHOUT CONTRAST MRA NECK WITHOUT AND WITH CONTRAST TECHNIQUE: Multiplanar, multi-echo pulse sequences of the brain and surrounding structures were acquired without intravenous contrast. Angiographic images of the Circle of Willis were acquired using MRA technique without intravenous contrast. Angiographic images of the neck were acquired using MRA technique without and with intravenous contrast. Carotid stenosis measurements (when applicable) are obtained utilizing NASCET criteria, using the distal internal carotid diameter as the denominator. CONTRAST:  87m GADAVIST GADOBUTROL 1 MMOL/ML IV SOLN COMPARISON:  None Available. FINDINGS: MRI HEAD FINDINGS Brain: No acute infarct, mass effect or extra-axial collection. Single left occipital chronic microhemorrhage. There is multifocal hyperintense T2-weighted signal within the white matter. Generalized volume loss. The midline structures are normal. Vascular: Normal flow voids. Skull and upper cervical spine: Normal marrow signal. Sinuses/Orbits: No acute or significant finding. Other: None. MRA HEAD FINDINGS POSTERIOR CIRCULATION: --Vertebral arteries: Normal --Inferior cerebellar arteries: Normal. --Basilar artery: Normal. --Superior cerebellar arteries: Normal. --Posterior cerebral arteries: Normal. ANTERIOR CIRCULATION: --Intracranial internal carotid arteries: Normal. --Anterior cerebral arteries (ACA): Normal. --Middle cerebral arteries (MCA): Normal. MRA NECK FINDINGS Aortic arch: Normal branching pattern.  Unremarkable. Right carotid system: Severe stenosis at the carotid bifurcation extending into the ICA with angiographic string sign. Stenosis is approximately 90%. Left carotid system: Atherosclerosis at the left carotid bifurcation without hemodynamically significant stenosis. Vertebral arteries: Normal Other: None IMPRESSION: 1. No acute intracranial abnormality. 2. No emergent large vessel  occlusion. 3. Severe stenosis of the right internal carotid artery at the carotid bifurcation extending into the ICA with angiographic string sign. Stenosis is approximately 90%. Electronically Signed   By: KUlyses JarredM.D.   On: 03/01/2022 00:23   CT HEAD WO CONTRAST  Result Date: 02/28/2022 CLINICAL DATA:  Neuro deficit, acute, stroke suspected. Left eye vision loss x 4 days. EXAM: CT HEAD WITHOUT CONTRAST TECHNIQUE: Contiguous axial images were obtained from the base of the skull through the vertex without intravenous contrast. RADIATION DOSE REDUCTION: This exam was performed according to the departmental dose-optimization program which includes automated exposure control, adjustment of the mA and/or kV according to patient size and/or use of iterative reconstruction technique. COMPARISON:  Head CT 06/25/2011 FINDINGS: Brain: There is no evidence of an acute infarct, intracranial hemorrhage, mass, midline shift, or extra-axial fluid collection. Patchy hypodensities in the cerebral white matter bilaterally have mildly progressed and are nonspecific but compatible with moderate chronic small vessel ischemic disease. There is mild cerebral atrophy. Vascular: Calcified atherosclerosis at the skull base. No hyperdense vessel. Skull: No acute fracture or suspicious osseous lesion. Sinuses/Orbits: No significant inflammatory changes in the included paranasal sinuses. Clear mastoid air cells. Bilateral cataract extraction. Other: None. IMPRESSION: 1. No evidence of acute intracranial abnormality. 2. Moderate chronic small vessel ischemic disease. Electronically Signed   By: ALogan BoresM.D.   On: 02/28/2022 13:02    Assessment/Plan  PVD (peripheral vascular disease) ([place] Has had multiple previous procedures and we performed surgery a couple of years ago.  This has not been checked that I can see in over a year.  After we deal with his carotid issues,  should come back for ABIs.  Hypertension blood pressure  control important in reducing the progression of atherosclerotic disease. On appropriate oral medications.   HLD (hyperlipidemia) lipid control important in reducing the progression of atherosclerotic disease. Continue statin therapy   COPD (chronic obstructive pulmonary disease) (HCC) Severe and on chronic oxygen therapy.  Would be high risk for open surgical therapy and would benefit from percutaneous carotid intervention with stenting.  Carotid stenosis I have independently reviewed his MR angiogram.  MRI is not nearly as accurate for evaluation of carotid stenosis, but this does suggest a very high-grade stenosis in the right internal carotid artery in the cervical portion of 90 or greater percent.  Given his severe comorbidities most notably his severe COPD on oxygen, he is not a great surgical candidate but carotid stenting would be an option.  He has stroke with this being the most likely cause.  Given this scenario, a carotid angiogram would be appropriate to ensure that significant disease is present and if it is planning to proceed with carotid stenting at that time.  I discussed the risks and benefits of carotid stenting versus medical management.  I had a long discussion with he as well as his daughter on the phone today.  This is a critical and life threatening situation.    [name], MD  03/08/2022 3:07 PM    This note was created with Dragon medical transcription system.  Any errors from dictation are purely unintentional

## 2022-03-08 NOTE — Assessment & Plan Note (Signed)
blood pressure control important in reducing the progression of atherosclerotic disease. On appropriate oral medications.  

## 2022-03-08 NOTE — Patient Instructions (Signed)
Continue weighing daily and call for an overnight weight gain of 3 pounds or more or a weekly weight gain of more than 5 pounds. ? ? ?If you have voicemail, please make sure your mailbox is cleaned out so that we may leave a message and please make sure to listen to any voicemails.  ? ? ?

## 2022-03-12 ENCOUNTER — Telehealth (INDEPENDENT_AMBULATORY_CARE_PROVIDER_SITE_OTHER): Payer: Self-pay

## 2022-03-12 ENCOUNTER — Ambulatory Visit: Payer: Medicare Other | Admitting: Family

## 2022-03-12 NOTE — Telephone Encounter (Signed)
Spoke with the patient's daughter, he is scheduled with Dr. Lucky Cowboy on 03/18/22 with a 9:30 am [facility] right carotid stent placement. Pre-procedure instructions were discussed and will be mailed.

## 2022-03-18 ENCOUNTER — Inpatient Hospital Stay
Admission: RE | Admit: 2022-03-18 | Discharge: 2022-03-19 | DRG: 035 | Disposition: A | Discharge status: Home / Self Care | Payer: Medicare Other | Attending: Vascular Surgery | Admitting: Vascular Surgery

## 2022-03-18 ENCOUNTER — Encounter: Payer: Self-pay | Admitting: Vascular Surgery

## 2022-03-18 ENCOUNTER — Other Ambulatory Visit: Payer: Self-pay

## 2022-03-18 ENCOUNTER — Encounter
Admission: RE | Disposition: A | Discharge status: Home / Self Care | Payer: Self-pay | Source: Home / Self Care | Attending: Vascular Surgery

## 2022-03-18 DIAGNOSIS — Z9981 Dependence on supplemental oxygen: Secondary | ICD-10-CM | POA: Diagnosis not present

## 2022-03-18 DIAGNOSIS — Z85828 Personal history of other malignant neoplasm of skin: Secondary | ICD-10-CM

## 2022-03-18 DIAGNOSIS — Q2549 Other congenital malformations of aorta: Secondary | ICD-10-CM

## 2022-03-18 DIAGNOSIS — I700 Atherosclerosis of aorta: Secondary | ICD-10-CM | POA: Diagnosis present

## 2022-03-18 DIAGNOSIS — E785 Hyperlipidemia, unspecified: Secondary | ICD-10-CM | POA: Diagnosis present

## 2022-03-18 DIAGNOSIS — R011 Cardiac murmur, unspecified: Secondary | ICD-10-CM | POA: Diagnosis present

## 2022-03-18 DIAGNOSIS — Y713 Surgical instruments, materials and cardiovascular devices (including sutures) associated with adverse incidents: Secondary | ICD-10-CM | POA: Diagnosis not present

## 2022-03-18 DIAGNOSIS — I480 Paroxysmal atrial fibrillation: Secondary | ICD-10-CM | POA: Diagnosis present

## 2022-03-18 DIAGNOSIS — I2510 Atherosclerotic heart disease of native coronary artery without angina pectoris: Secondary | ICD-10-CM | POA: Diagnosis present

## 2022-03-18 DIAGNOSIS — Z7951 Long term (current) use of inhaled steroids: Secondary | ICD-10-CM

## 2022-03-18 DIAGNOSIS — I110 Hypertensive heart disease with heart failure: Secondary | ICD-10-CM | POA: Diagnosis present

## 2022-03-18 DIAGNOSIS — Z7982 Long term (current) use of aspirin: Secondary | ICD-10-CM | POA: Diagnosis not present

## 2022-03-18 DIAGNOSIS — Z87891 Personal history of nicotine dependence: Secondary | ICD-10-CM

## 2022-03-18 DIAGNOSIS — I70245 Atherosclerosis of native arteries of left leg with ulceration of other part of foot: Secondary | ICD-10-CM | POA: Diagnosis present

## 2022-03-18 DIAGNOSIS — Z66 Do not resuscitate: Secondary | ICD-10-CM | POA: Diagnosis present

## 2022-03-18 DIAGNOSIS — L97529 Non-pressure chronic ulcer of other part of left foot with unspecified severity: Secondary | ICD-10-CM | POA: Diagnosis present

## 2022-03-18 DIAGNOSIS — Z79899 Other long term (current) drug therapy: Secondary | ICD-10-CM | POA: Diagnosis not present

## 2022-03-18 DIAGNOSIS — Z91030 Bee allergy status: Secondary | ICD-10-CM

## 2022-03-18 DIAGNOSIS — Z8673 Personal history of transient ischemic attack (TIA), and cerebral infarction without residual deficits: Secondary | ICD-10-CM | POA: Diagnosis not present

## 2022-03-18 DIAGNOSIS — J449 Chronic obstructive pulmonary disease, unspecified: Secondary | ICD-10-CM | POA: Diagnosis present

## 2022-03-18 DIAGNOSIS — Y838 Other surgical procedures as the cause of abnormal reaction of the patient, or of later complication, without mention of misadventure at the time of the procedure: Secondary | ICD-10-CM | POA: Diagnosis not present

## 2022-03-18 DIAGNOSIS — I6521 Occlusion and stenosis of right carotid artery: Principal | ICD-10-CM | POA: Diagnosis present

## 2022-03-18 DIAGNOSIS — I77810 Thoracic aortic ectasia: Secondary | ICD-10-CM | POA: Diagnosis present

## 2022-03-18 DIAGNOSIS — Z7901 Long term (current) use of anticoagulants: Secondary | ICD-10-CM

## 2022-03-18 DIAGNOSIS — I5032 Chronic diastolic (congestive) heart failure: Secondary | ICD-10-CM | POA: Diagnosis present

## 2022-03-18 DIAGNOSIS — L7622 Postprocedural hemorrhage and hematoma of skin and subcutaneous tissue following other procedure: Secondary | ICD-10-CM | POA: Diagnosis not present

## 2022-03-18 DIAGNOSIS — Z7983 Long term (current) use of bisphosphonates: Secondary | ICD-10-CM | POA: Diagnosis not present

## 2022-03-18 HISTORY — PX: CAROTID PTA/STENT INTERVENTION: CATH118231

## 2022-03-18 LAB — MRSA NEXT GEN BY PCR, NASAL: MRSA by PCR Next Gen: NOT DETECTED

## 2022-03-18 LAB — BUN: BUN: 35 mg/dL — ABNORMAL HIGH (ref 8–23)

## 2022-03-18 LAB — CREATININE, SERUM
Creatinine, Ser: 1.24 mg/dL (ref 0.61–1.24)
GFR, Estimated: 55 mL/min — ABNORMAL LOW (ref >60)

## 2022-03-18 LAB — GLUCOSE, CAPILLARY: Glucose-Capillary: 88 mg/dL (ref 70–99)

## 2022-03-18 LAB — POCT ACTIVATED CLOTTING TIME: Activated Clotting Time: 239 seconds

## 2022-03-18 SURGERY — CAROTID PTA/STENT INTERVENTION
Anesthesia: Moderate Sedation | Laterality: Right

## 2022-03-18 MED ORDER — ONDANSETRON HCL 4 MG/2ML IJ SOLN: 4.0000 mg | Freq: Four times a day (QID) | INTRAMUSCULAR | Status: DC | PRN

## 2022-03-18 MED ORDER — SODIUM CHLORIDE 0.9 % IV SOLN: 500.0000 mL | Freq: Once | INTRAVENOUS | Status: DC | PRN

## 2022-03-18 MED ORDER — CEFAZOLIN SODIUM-DEXTROSE 2-4 GM/100ML-% IV SOLN: 2.0000 g | INTRAVENOUS | Status: AC

## 2022-03-18 MED ORDER — ATROPINE SULFATE 1 MG/10ML IJ SOSY
PREFILLED_SYRINGE | INTRAMUSCULAR | Status: AC
  Filled 2022-03-18: qty 20

## 2022-03-18 MED ORDER — APIXABAN 2.5 MG PO TABS
2.5000 mg | ORAL_TABLET | Freq: Two times a day (BID) | ORAL | Status: DC
  Administered 2022-03-18 – 2022-03-19 (×3): 2.5 mg via ORAL
  Filled 2022-03-18 (×3): qty 1

## 2022-03-18 MED ORDER — CICLOPIROX OLAMINE 0.77 % EX SUSP: Freq: Two times a day (BID) | CUTANEOUS | Status: DC

## 2022-03-18 MED ORDER — ACETAMINOPHEN 325 MG RE SUPP: 325.0000 mg | RECTAL | Status: DC | PRN

## 2022-03-18 MED ORDER — LABETALOL HCL 5 MG/ML IV SOLN: 10.0000 mg | INTRAVENOUS | Status: DC | PRN

## 2022-03-18 MED ORDER — FENTANYL CITRATE (PF) 100 MCG/2ML IJ SOLN
INTRAMUSCULAR | Status: DC | PRN
  Administered 2022-03-18: 25 ug via INTRAVENOUS

## 2022-03-18 MED ORDER — HEPARIN SODIUM (PORCINE) 1000 UNIT/ML IJ SOLN
INTRAMUSCULAR | Status: AC
  Filled 2022-03-18: qty 10

## 2022-03-18 MED ORDER — OXYCODONE-ACETAMINOPHEN 5-325 MG PO TABS: 1.0000 | ORAL_TABLET | ORAL | Status: DC | PRN

## 2022-03-18 MED ORDER — MIDAZOLAM HCL 2 MG/ML PO SYRP: 8.0000 mg | ORAL_SOLUTION | Freq: Once | ORAL | Status: DC | PRN

## 2022-03-18 MED ORDER — ALENDRONATE SODIUM 70 MG PO TABS: 70.0000 mg | ORAL_TABLET | ORAL | Status: DC

## 2022-03-18 MED ORDER — ADULT MULTIVITAMIN W/MINERALS CH
1.0000 | ORAL_TABLET | Freq: Every day | ORAL | Status: DC
  Administered 2022-03-18 – 2022-03-19 (×2): 1 via ORAL
  Filled 2022-03-18 (×2): qty 1

## 2022-03-18 MED ORDER — PHENYLEPHRINE 80 MCG/ML (10ML) SYRINGE FOR IV PUSH (FOR BLOOD PRESSURE SUPPORT)
PREFILLED_SYRINGE | INTRAVENOUS | Status: AC
  Filled 2022-03-18: qty 10

## 2022-03-18 MED ORDER — SODIUM CHLORIDE 0.9 % IV SOLN: INTRAVENOUS | Status: DC

## 2022-03-18 MED ORDER — ASPIRIN 81 MG PO TBEC
81.0000 mg | DELAYED_RELEASE_TABLET | Freq: Every day | ORAL | Status: DC
  Administered 2022-03-19: 81 mg via ORAL
  Filled 2022-03-18: qty 1

## 2022-03-18 MED ORDER — MAGNESIUM SULFATE 2 GM/50ML IV SOLN: 2.0000 g | Freq: Every day | INTRAVENOUS | Status: DC | PRN

## 2022-03-18 MED ORDER — IODIXANOL 320 MG/ML IV SOLN
INTRAVENOUS | Status: DC | PRN
  Administered 2022-03-18: 85 mL

## 2022-03-18 MED ORDER — CEFAZOLIN SODIUM-DEXTROSE 2-4 GM/100ML-% IV SOLN
2.0000 g | Freq: Three times a day (TID) | INTRAVENOUS | Status: AC
  Administered 2022-03-18 – 2022-03-19 (×2): 2 g via INTRAVENOUS
  Filled 2022-03-18 (×2): qty 100

## 2022-03-18 MED ORDER — METOPROLOL TARTRATE 5 MG/5ML IV SOLN: 2.0000 mg | INTRAVENOUS | Status: DC | PRN

## 2022-03-18 MED ORDER — ACETAMINOPHEN 325 MG PO TABS: 325.0000 mg | ORAL_TABLET | ORAL | Status: DC | PRN

## 2022-03-18 MED ORDER — UMECLIDINIUM BROMIDE 62.5 MCG/ACT IN AEPB
1.0000 | INHALATION_SPRAY | Freq: Every day | RESPIRATORY_TRACT | Status: DC
  Administered 2022-03-19: 1 via RESPIRATORY_TRACT
  Filled 2022-03-18: qty 7

## 2022-03-18 MED ORDER — MORPHINE SULFATE (PF) 4 MG/ML IV SOLN: 2.0000 mg | INTRAVENOUS | Status: DC | PRN

## 2022-03-18 MED ORDER — FAMOTIDINE 20 MG PO TABS: 40.0000 mg | ORAL_TABLET | Freq: Once | ORAL | Status: DC | PRN

## 2022-03-18 MED ORDER — PHENOL 1.4 % MT LIQD: 1.0000 | OROMUCOSAL | Status: DC | PRN

## 2022-03-18 MED ORDER — FENTANYL CITRATE PF 50 MCG/ML IJ SOSY
PREFILLED_SYRINGE | INTRAMUSCULAR | Status: AC
  Filled 2022-03-18: qty 1

## 2022-03-18 MED ORDER — CHLORHEXIDINE GLUCONATE CLOTH 2 % EX PADS
6.0000 | MEDICATED_PAD | Freq: Every day | CUTANEOUS | Status: DC
  Administered 2022-03-19: 6 via TOPICAL

## 2022-03-18 MED ORDER — HYDRALAZINE HCL 20 MG/ML IJ SOLN: 5.0000 mg | INTRAMUSCULAR | Status: DC | PRN

## 2022-03-18 MED ORDER — MIDAZOLAM HCL 2 MG/2ML IJ SOLN
INTRAMUSCULAR | Status: DC | PRN
  Administered 2022-03-18: 1 mg via INTRAVENOUS

## 2022-03-18 MED ORDER — FAMOTIDINE IN NACL 20-0.9 MG/50ML-% IV SOLN
20.0000 mg | Freq: Two times a day (BID) | INTRAVENOUS | Status: DC
  Administered 2022-03-18 – 2022-03-19 (×3): 20 mg via INTRAVENOUS
  Filled 2022-03-18 (×3): qty 50

## 2022-03-18 MED ORDER — HEPARIN SODIUM (PORCINE) 1000 UNIT/ML IJ SOLN
INTRAMUSCULAR | Status: DC | PRN
  Administered 2022-03-18: 7000 [IU] via INTRAVENOUS

## 2022-03-18 MED ORDER — VITAMIN C 500 MG PO TABS
250.0000 mg | ORAL_TABLET | Freq: Two times a day (BID) | ORAL | Status: DC
  Administered 2022-03-18 – 2022-03-19 (×3): 250 mg via ORAL
  Filled 2022-03-18 (×3): qty 1

## 2022-03-18 MED ORDER — DILTIAZEM HCL ER COATED BEADS 300 MG PO CP24
300.0000 mg | ORAL_CAPSULE | Freq: Every day | ORAL | Status: DC
  Administered 2022-03-18 – 2022-03-19 (×2): 300 mg via ORAL
  Filled 2022-03-18 (×2): qty 1

## 2022-03-18 MED ORDER — BENEFIBER PO POWD: Freq: Every day | ORAL | Status: DC

## 2022-03-18 MED ORDER — CICLOPIROX OLAMINE 0.77 % EX CREA: TOPICAL_CREAM | Freq: Two times a day (BID) | CUTANEOUS | Status: DC

## 2022-03-18 MED ORDER — TORSEMIDE 20 MG PO TABS
40.0000 mg | ORAL_TABLET | ORAL | Status: DC
  Administered 2022-03-18: 40 mg via ORAL
  Filled 2022-03-18: qty 2

## 2022-03-18 MED ORDER — ATORVASTATIN CALCIUM 20 MG PO TABS
40.0000 mg | ORAL_TABLET | Freq: Every evening | ORAL | Status: DC
  Administered 2022-03-18 – 2022-03-19 (×2): 40 mg via ORAL
  Filled 2022-03-18 (×2): qty 2

## 2022-03-18 MED ORDER — DIPHENHYDRAMINE HCL 50 MG/ML IJ SOLN: 50.0000 mg | Freq: Once | INTRAMUSCULAR | Status: DC | PRN

## 2022-03-18 MED ORDER — GUAIFENESIN-DM 100-10 MG/5ML PO SYRP: 15.0000 mL | ORAL_SOLUTION | ORAL | Status: DC | PRN

## 2022-03-18 MED ORDER — CEFAZOLIN SODIUM-DEXTROSE 2-4 GM/100ML-% IV SOLN
INTRAVENOUS | Status: AC
  Administered 2022-03-18: 2 g via INTRAVENOUS
  Filled 2022-03-18: qty 100

## 2022-03-18 MED ORDER — ATROPINE SULFATE 1 MG/10ML IJ SOSY
PREFILLED_SYRINGE | INTRAMUSCULAR | Status: DC | PRN
  Administered 2022-03-18: 1 mg via INTRAVENOUS

## 2022-03-18 MED ORDER — VITAMIN D (ERGOCALCIFEROL) 1.25 MG (50000 UNIT) PO CAPS: 50000.0000 [IU] | ORAL_CAPSULE | ORAL | Status: DC

## 2022-03-18 MED ORDER — ALUM & MAG HYDROXIDE-SIMETH 200-200-20 MG/5ML PO SUSP: 15.0000 mL | ORAL | Status: DC | PRN

## 2022-03-18 MED ORDER — MIDAZOLAM HCL 2 MG/2ML IJ SOLN
INTRAMUSCULAR | Status: AC
  Filled 2022-03-18: qty 2

## 2022-03-18 MED ORDER — OYSTER SHELL CALCIUM/D3 500-5 MG-MCG PO TABS
1.0000 | ORAL_TABLET | Freq: Every day | ORAL | Status: DC
  Administered 2022-03-19: 1 via ORAL
  Filled 2022-03-18: qty 1

## 2022-03-18 MED ORDER — METOPROLOL TARTRATE 50 MG PO TABS
50.0000 mg | ORAL_TABLET | Freq: Two times a day (BID) | ORAL | Status: DC
  Administered 2022-03-18 – 2022-03-19 (×2): 50 mg via ORAL
  Filled 2022-03-18 (×2): qty 1

## 2022-03-18 MED ORDER — POTASSIUM CHLORIDE CRYS ER 20 MEQ PO TBCR: 20.0000 meq | EXTENDED_RELEASE_TABLET | Freq: Every day | ORAL | Status: DC | PRN

## 2022-03-18 MED ORDER — ACETAMINOPHEN 500 MG PO TABS: 500.0000 mg | ORAL_TABLET | Freq: Four times a day (QID) | ORAL | Status: DC | PRN

## 2022-03-18 MED ORDER — CLOPIDOGREL BISULFATE 75 MG PO TABS
75.0000 mg | ORAL_TABLET | Freq: Every day | ORAL | Status: DC
  Administered 2022-03-19: 75 mg via ORAL
  Filled 2022-03-18: qty 1

## 2022-03-18 MED ORDER — MUPIROCIN 2 % EX OINT: 1.0000 | TOPICAL_OINTMENT | Freq: Every day | CUTANEOUS | Status: DC | PRN

## 2022-03-18 MED ORDER — TORSEMIDE 20 MG PO TABS
20.0000 mg | ORAL_TABLET | ORAL | Status: DC
  Administered 2022-03-19: 20 mg via ORAL
  Filled 2022-03-18: qty 1

## 2022-03-18 MED ORDER — FLUTICASONE FUROATE-VILANTEROL 100-25 MCG/ACT IN AEPB
1.0000 | INHALATION_SPRAY | Freq: Every day | RESPIRATORY_TRACT | Status: DC
  Administered 2022-03-19: 1 via RESPIRATORY_TRACT
  Filled 2022-03-18: qty 28

## 2022-03-18 MED ORDER — HYDROMORPHONE HCL 1 MG/ML IJ SOLN: 1.0000 mg | Freq: Once | INTRAMUSCULAR | Status: DC | PRN

## 2022-03-18 MED ORDER — METHYLPREDNISOLONE SODIUM SUCC 125 MG IJ SOLR: 125.0000 mg | Freq: Once | INTRAMUSCULAR | Status: DC | PRN

## 2022-03-18 MED ORDER — DOPAMINE-DEXTROSE 3.2-5 MG/ML-% IV SOLN
INTRAVENOUS | Status: AC
  Filled 2022-03-18: qty 250

## 2022-03-18 MED ORDER — POTASSIUM CHLORIDE CRYS ER 20 MEQ PO TBCR
20.0000 meq | EXTENDED_RELEASE_TABLET | Freq: Every day | ORAL | Status: DC
  Administered 2022-03-18 – 2022-03-19 (×2): 20 meq via ORAL
  Filled 2022-03-18 (×2): qty 1

## 2022-03-18 MED ORDER — HYDRALAZINE HCL 20 MG/ML IJ SOLN
INTRAMUSCULAR | Status: AC
  Filled 2022-03-18: qty 1

## 2022-03-18 MED ORDER — PHENYLEPHRINE HCL-NACL 20-0.9 MG/250ML-% IV SOLN
INTRAVENOUS | Status: AC
  Filled 2022-03-18: qty 250

## 2022-03-18 SURGICAL SUPPLY — 27 items
BALLN VIATRAC 4X30X135 (BALLOONS) ×1
BALLN VIATRAC 5X20X135 (BALLOONS) ×1
BALLN VTRAC 4.5X30X135 (BALLOONS) ×1
BALLOON VIATRAC 4X30X135 (BALLOONS) IMPLANT
BALLOON VIATRAC 5X20X135 (BALLOONS) IMPLANT
BALLOON VTRAC 4.5X30X135 (BALLOONS) IMPLANT
CATH ANGIO 5F PIGTAIL 100CM (CATHETERS) IMPLANT
CATH BEACON 5 .035 100 JB2 TIP (CATHETERS) IMPLANT
COVER DRAPE FLUORO 36X44 (DRAPES) IMPLANT
COVER PROBE ULTRASOUND 5X96 (MISCELLANEOUS) IMPLANT
DEVICE EMBOSHIELD NAV6 4.0-7.0 (FILTER) IMPLANT
DEVICE SAFEGUARD 24CM (GAUZE/BANDAGES/DRESSINGS) IMPLANT
DEVICE STARCLOSE SE CLOSURE (Vascular Products) IMPLANT
DEVICE TORQUE .025-.038 (MISCELLANEOUS) IMPLANT
GLIDEWIRE ANGLED SS 035X260CM (WIRE) IMPLANT
GUIDEWIRE PFTE-COATED .018X300 (WIRE) IMPLANT
GUIDEWIRE VASC STIFF .038X260 (WIRE) IMPLANT
KIT CAROTID MANIFOLD (MISCELLANEOUS) IMPLANT
KIT ENCORE 26 ADVANTAGE (KITS) IMPLANT
KIT MICROPUNCTURE NIT STIFF (SHEATH) IMPLANT
PACK ANGIOGRAPHY (CUSTOM PROCEDURE TRAY) ×2 IMPLANT
SHEATH BRITE TIP 6FRX11 (SHEATH) IMPLANT
SHEATH BRITE TIP 6FRX5.5 (SHEATH) IMPLANT
SHEATH NEURON MAX 6FR 90CM (SHEATH) IMPLANT
STENT XACT CAR 9-7X40X136 (Permanent Stent) IMPLANT
SYR MEDRAD MARK 7 150ML (SYRINGE) IMPLANT
WIRE GUIDERIGHT .035X150 (WIRE) IMPLANT

## 2022-03-18 NOTE — Interval H&P Note (Signed)
History and Physical Interval Note:  03/18/2022 9:26 AM  [name]  has presented today for surgery, with the diagnosis of R Carotid Stent   ABBOTT   Carotid artery stenosis.  The various methods of treatment have been discussed with the patient and family. After consideration of risks, benefits and other options for treatment, the patient has consented to  Procedure(s): CAROTID PTA/STENT INTERVENTION (Right) as a surgical intervention.  The patient's history has been reviewed, patient examined, no change in status, stable for surgery.  I have reviewed the patient's chart and labs.  Questions were answered to the patient's satisfaction.     [name]

## 2022-03-18 NOTE — Op Note (Signed)
OPERATIVE NOTE DATE: 03/18/2022  PROCEDURE:  Ultrasound guidance for vascular access left femoral artery  Placement of a 9 mm proximal, 7 mm distal 4 cm long Exact stent with the use of the NAV-6 embolic protection device in the right carotid artery  PRE-OPERATIVE DIAGNOSIS: 1. High grade right carotid artery stenosis. 2. Oxygen dependent COPD  POST-OPERATIVE DIAGNOSIS:  Same as above  SURGEON: [name], MD  ASSISTANT(S):  none  ANESTHESIA: local/MCS  ESTIMATED BLOOD LOSS:  25 cc  CONTRAST: 85 cc  FLUORO TIME: 8.9 minutes  MODERATE CONSCIOUS SEDATION TIME:  Approximately 51 minutes using 1 mg of Versed and 25 mcg of Fentanyl  FINDING(S): 1.   95% highly calcified carotid artery stenosis  SPECIMEN(S):   none  INDICATIONS:   Patient is a 89 y.o. male who presents with an MRI suggesting high-grade right carotid artery stenosis.  The patient has multiple medical comorbidities most notably oxygen dependent COPD and carotid artery stenting was felt to be preferred to endarterectomy for that reason. I have completed Share Decision Making with [name] prior to surgery.  Conversations included: -Discussion of all treatment options including carotid endarterectomy (CEA), CAS (which includes transcarotid artery revascularization (TCAR)), and optimal medical therapy (OMT)). -Explanation of risks and benefits for each option specific to [place] clinical situation. -Integration of clinical guidelines as it relates to the patient's history and co-morbidities -Discussion and incorporation of [name] and their personal preferences and priorities in choosing a treatment plan.  The patient and his daughter were both involved shared Decision Making.  This process was done with the patient and his daughter  Risks and benefits were discussed and informed consent was obtained.   DESCRIPTION: After obtaining full informed written consent, the patient was  brought back to the vascular suite and placed supine upon the table.  The patient received IV antibiotics prior to induction. Moderate conscious sedation was administered during a face to face encounter with the patient throughout the procedure with my supervision of the RN administering medicines and monitoring the patients vital signs and mental status throughout from the start of the procedure until the patient was taken to the recovery room.  After obtaining adequate anesthesia, the patient was prepped and draped in the standard fashion.   The left femoral artery was visualized with ultrasound and found to be widely patent. It was then accessed under direct ultrasound guidance without difficulty with a Seldinger needle. A permanent image was recorded. A J-wire was placed and we then placed a 6 French sheath. The patient was then heparinized and a total of 7000 units of intravenous heparin were given and an ACT was checked to confirm successful anticoagulation. A pigtail catheter was then placed into the ascending aorta. This showed an extreme type III aortic arch with marked reverse curve. I then selectively cannulated the innominate artery without difficulty with a JB2 catheter and somewhat surprisingly easily advanced into the mid right common carotid artery.  Cervical and cerebral carotid angiography was then performed. There were no obvious intracranial filling defects with essentially no anterior cerebral flow initially. The carotid bifurcation demonstrated a very calcified very high-grade stenosis in the 95% range.  I then advanced into the external carotid artery with a Glidewire and the JB2 catheter and then exchanged for the Amplatz Super Stiff wire. Over the Amplatz Super Stiff wire, a 6 French Neuron Max sheath was placed into the mid common carotid artery. I then used the NAV-6  Embolic protection device and crossed the lesion and parked this in the distal internal carotid artery at the base of the  skull.  This required a buddy wire system due to the highly calcific and tight nature of the lesion.  I did a buddy wire with a 0.018 advantage wire and then was able to get the embolic protection device to follow the advantage wire.  0.018 advantage wire was removed.  Due to the tight calcific nature of the lesion, I did elect to predilate this is a new the stent would not cross.  A 4 mm diameter by 3 cm length balloon was inflated to 8 atm to predilate the lesion.  The embolic protection device was in place prior to the predilatation.  I then selected a 9 mm proximal, 7 mm distal, 4 cm long exact stent. This was deployed across the lesion encompassing it in its entirety in the internal carotid artery. A 4.5 and 5 mm diameter by 3 cm length balloon was used to post dilate the stent. Only about a 25% residual stenosis was present after angioplasty. Completion angiogram showed normal intracranial filling without new defects. At this point I elected to terminate the procedure. The sheath was removed and StarClose closure device was deployed in the left femoral artery with excellent hemostatic result. The patient was taken to the recovery room in stable condition having tolerated the procedure well.  COMPLICATIONS: none  CONDITION: stable  [name] 03/18/2022 12:03 PM   This note was created with Dragon Medical transcription system. Any errors in dictation are purely unintentional.

## 2022-03-18 NOTE — Progress Notes (Signed)
[address] Secure chatted Dr. Lucky Cowboy about bleeding at left groin site bleeding a small amount. Pace PA in to check left groin site.

## 2022-03-19 ENCOUNTER — Encounter: Payer: Self-pay | Admitting: Vascular Surgery

## 2022-03-19 LAB — CBC
HCT: 32 % — ABNORMAL LOW (ref 39.0–52.0)
Hemoglobin: 10.3 g/dL — ABNORMAL LOW (ref 13.0–17.0)
MCH: 30.8 pg (ref 26.0–34.0)
MCHC: 32.2 g/dL (ref 30.0–36.0)
MCV: 95.8 fL (ref 80.0–100.0)
Platelets: 303 10*3/uL (ref 150–400)
RBC: 3.34 MIL/uL — ABNORMAL LOW (ref 4.22–5.81)
RDW: 14.5 % (ref 11.5–15.5)
WBC: 15.6 10*3/uL — ABNORMAL HIGH (ref 4.0–10.5)
nRBC: 0 % (ref 0.0–0.2)

## 2022-03-19 LAB — BASIC METABOLIC PANEL
Anion gap: 12 (ref 5–15)
BUN: 33 mg/dL — ABNORMAL HIGH (ref 8–23)
CO2: 32 mmol/L (ref 22–32)
Calcium: 9.1 mg/dL (ref 8.9–10.3)
Chloride: 95 mmol/L — ABNORMAL LOW (ref 98–111)
Creatinine, Ser: 1.4 mg/dL — ABNORMAL HIGH (ref 0.61–1.24)
GFR, Estimated: 47 mL/min — ABNORMAL LOW (ref >60)
Glucose, Bld: 104 mg/dL — ABNORMAL HIGH (ref 70–99)
Potassium: 3.5 mmol/L (ref 3.5–5.1)
Sodium: 139 mmol/L (ref 135–145)

## 2022-03-19 MED ORDER — FAMOTIDINE 20 MG PO TABS: 20.0000 mg | ORAL_TABLET | Freq: Every day | ORAL | Status: DC

## 2022-03-19 MED ORDER — MUPIROCIN 2 % EX OINT
TOPICAL_OINTMENT | Freq: Every day | CUTANEOUS | Status: DC
  Filled 2022-03-19: qty 22

## 2022-03-19 MED ORDER — LIDOCAINE-EPINEPHRINE 1 %-1:100000 IJ SOLN
3.0000 mL | Freq: Once | INTRAMUSCULAR | Status: AC
  Administered 2022-03-19: 3 mL via INTRADERMAL
  Filled 2022-03-19: qty 3

## 2022-03-19 NOTE — Discharge Summary (Signed)
[facility]    Discharge Summary    Patient ID:  [name] MRN: [id number] DOB/AGE: [birth year] 89 y.o.  Admit date: 03/18/2022 Discharge date: 03/19/2022 Date of Surgery: 03/18/2022 Surgeon: Surgeon(s): Lucky Cowboy Erskine Squibb, MD  Admission Diagnosis: Carotid stenosis, right [I65.21]  Discharge Diagnoses:  Carotid stenosis, right [I65.21]  Secondary Diagnoses: Past Medical History:  Diagnosis Date   (HFpEF) heart failure with preserved ejection fraction ([place])    Aortic atherosclerosis ([place])    Basal cell carcinoma 09/13/2021   Left cheek. EDC   Cardiac murmur    Grade I/VI medium pitched mid systolic blowing at lower LSB   COPD (chronic obstructive pulmonary disease) (HCC)    Coronary artery disease    Dependence on supplemental oxygen    HLD (hyperlipidemia)    Hypertension    Paroxysmal atrial fibrillation (HCC)    PVD (peripheral vascular disease) (HCC)    Skin cancer    nose and ear - tx with Mohs   Thoracic aortic ectasia (HCC)    a.) measured 3.6 x 3.3 cm on 10/08/2019    Procedure(s): CAROTID PTA/STENT INTERVENTION  Discharged Condition: good  HPI:  [name] is a 89 year old male who is now status postop day 1 from a right carotid angioplasty with stent placement.  He has a past history of heart failure with preserved EF aortic atherosclerosis, A-fib, hypertension, PVD.  He did very well after surgery.  He did not require any blood pressure control medication or heart rate control medication.  He did have some oozing at the insertion site of his left groin.  That was treated with 3 cc of lidocaine injection with 1-100,000 epinephrine.  Bleeding, hematoma or seroma afterwards noted.  Patient is discharged this afternoon.  Vitals all remained stable  Hospital Course:  [name] is a 89 y.o. male is S/P Right Extubated: POD # 0 Physical Exam:  Alert notes x3, no acute distress Face: Symmetrical.  Tongue is  midline. Neck: Trachea is midline.  No swelling or bruising. Cardiovascular: Irregular Rhythm, HX AFIB Pulmonary: Clear to auscultation bilaterally Abdomen: Soft, nontender, nondistended Left groin access: Clean dry and intact.  No swelling or drainage noted, No hematoma or swelling noted.  Left lower extremity: Thigh soft.  Calf soft.  Extremities warm distally toes.  Hard to palpate pedal pulses however the foot is warm is her good capillary refill. Right lower extremity: Thigh soft.  Calf soft.  Extremities warm distally toes.  Hard to palpate pedal pulses however the foot is warm is her good capillary refill. Neurological: No deficits noted   Post-op wounds:  clean, dry, intact or healing well  Pt. Ambulating, voiding and taking PO diet without difficulty. Pt pain controlled with PO pain meds.  Labs:  As below  Complications: none  Consults:    Significant Diagnostic Studies: CBC Lab Results  Component Value Date   WBC 15.6 (H) 03/19/2022   HGB 10.3 (L) 03/19/2022   HCT 32.0 (L) 03/19/2022   MCV 95.8 03/19/2022   PLT 303 03/19/2022    BMET    Component Value Date/Time   NA 139 03/19/2022 0350   NA 140 02/02/2014 0356   K 3.5 03/19/2022 0350   K 3.7 02/02/2014 0356   CL 95 (L) 03/19/2022 0350   CL 104 02/02/2014 0356   CO2 32 03/19/2022 0350   CO2 29 02/02/2014 0356   GLUCOSE 104 (H) 03/19/2022 0350   GLUCOSE 160 (H) 02/02/2014 0356  BUN 33 (H) 03/19/2022 0350   BUN 33 (H) 02/02/2014 0356   CREATININE 1.40 (H) 03/19/2022 0350   CREATININE 0.86 02/02/2014 0356   CALCIUM 9.1 03/19/2022 0350   CALCIUM 8.6 10/10/2019 0305   GFRNONAA 47 (L) 03/19/2022 0350   GFRNONAA >60 02/02/2014 0356   GFRNONAA >60 06/25/2011 1325   GFRAA >60 10/15/2019 0634   GFRAA >60 02/02/2014 0356   GFRAA >60 06/25/2011 1325   COAG Lab Results  Component Value Date   INR 1.5 (H) 02/28/2022   INR 1.4 (H) 10/15/2019   INR 1.7 (H) 10/14/2019     Disposition:  Discharge to  :Home  Allergies as of 03/19/2022       Reactions   Bee Venom Anaphylaxis        Medication List     TAKE these medications    acetaminophen 500 MG tablet Commonly known as: TYLENOL Take 500 mg by mouth every 6 (six) hours as needed for fever or pain.   alendronate 70 MG tablet Commonly known as: FOSAMAX Take 70 mg by mouth once a week. Sunday morning with full glass of water and sit up do not lay down   amoxicillin-clavulanate 875-125 MG tablet Commonly known as: AUGMENTIN Take 1 tablet by mouth 2 (two) times daily.   apixaban 2.5 MG Tabs tablet Commonly known as: ELIQUIS Take 2.5 mg by mouth 2 (two) times daily.   ascorbic acid 250 MG tablet Commonly known as: VITAMIN C Take 1 tablet (250 mg total) by mouth 2 (two) times daily.   aspirin EC 81 MG tablet Take 1 tablet (81 mg total) by mouth daily at 6 (six) AM. Swallow whole.   atorvastatin 40 MG tablet Commonly known as: LIPITOR Take 1 tablet (40 mg total) by mouth daily.   BENEFIBER PO Take by mouth. 2 teaspoons twice a day   CALCIUM 600/VITAMIN D PO Take by mouth.   Cartia XT 300 MG 24 hr capsule Generic drug: diltiazem Take 300 mg by mouth daily.   ciclopirox 0.77 % cream Commonly known as: LOPROX Apply topically 2 (two) times daily. Apply to feet BID x 1 month. Then QW thereafter.   ciclopirox 0.77 % Susp Commonly known as: LOPROX Apply once daily to affected fingernails   metoprolol tartrate 50 MG tablet Commonly known as: LOPRESSOR Take 50 mg by mouth 2 (two) times daily.   multivitamin with minerals Tabs tablet Take 1 tablet by mouth daily.   mupirocin ointment 2 % Commonly known as: BACTROBAN Apply 1 Application topically daily. Apply to wounds once daily until healed.   potassium chloride 10 MEQ tablet Commonly known as: KLOR-CON M Take 2 tablets (20 mEq total) by mouth daily. What changed: how much to take   torsemide 20 MG tablet Commonly known as: DEMADEX Take 75m on M, W, F  and 232mon Tu, Thur,Sat & Sunday What changed:  how much to take how to take this when to take this additional instructions   Trelegy Ellipta 100-62.5-25 MCG/ACT Aepb Generic drug: Fluticasone-Umeclidin-Vilant Inhale into the lungs.   Vitamin D (Ergocalciferol) 1.25 MG (50000 UNIT) Caps capsule Commonly known as: DRISDOL Take 1 capsule (50,000 Units total) by mouth every 7 (seven) days.       Verbal and written Discharge instructions given to the patient. Wound care per Discharge AVS  Follow-up Information     Dew, JaErskine SquibbMD Follow up in 1 month(s).   Specialties: Vascular Surgery, Radiology, Interventional Cardiology Why: Follow up with FaAr[facility] Medici  [place] right carotid duplex ultrasound Contact information: [place] [address] [phone]                 Signed: Drema Pry, NP  03/19/2022, 8:55 AM

## 2022-03-19 NOTE — Progress Notes (Signed)
Notified by his nurse Myra, after walking patient started to bleed/ooze from his left groin incision. Removed the PAD dressing and injected around the incision site 3 cc's of 1:1000,000 Lidocaine with Epinephrine to stop bleeding. Pressure dressing was applied. Patient was instructed to ly flat for the next 2 hours. Will follow up and recheck later this afternoon and if not bleeding/oozing from the site patient will be discharged home.

## 2022-03-19 NOTE — Progress Notes (Addendum)
1000 [name] deflated PAD removed per now order by B.Pace NP. Patient walked briefly in hall and up in chair per patient request. Left groin site oozy but contained in [place] and gauze dressing. 1100 Groin site rechecked and now bleeding bright red blood. Patient placed back in bed and secure chat sent to Dr.Dew. 1110. New PAD placed on patient's left groin and inflated to 30 cc. Patient calm. 1130 PAD removed per verbal order Eulogio Ditch NP. Pressure held per NP on site as supplies gathered. 1200 Left groin site injected with 1% Lidocaine with epinephrine. 1230 B.Pace NP applied pressure dressing to left groin.1500 Patient walked 1/2 round of unit.1600 Patient walked 1.5 laps of the unit. No bleeding noted in left groin. Patient very upset and wants to go home. B.Pace NP notified. Patient uncooperative with bed change. Right skin tear noted on right upper forearm. Skin tear bleeding therefore dressed with metapore foam dressing. Patient refused for nurse to place a bulky dressing to contain the bleeding. Daughter informed of skin tear on patient's right forearm and patient's refusal to let nurse redress to contain subsequent bleeding.Late note- Dr. Jens Som in to see patient today. Assessed left great toe. Orders received. 1700 Order reconciliation not complete. Adjustments made as allowed by RN. [place] Shark NP called to clear up issues with D/C instructions.1730 D/C orders printed. Discharge instructions taken to car with patient. Explained dressings and discharge instructions to daughter. Questions answered.. No new prescriptions given. 1800 Patient sent home with daughter.  Late note 2/21 Daughter [name] called to ask questions about continued oozing of right forearm skin tear. All questions answered. Instructed to call patient's primary MD to follow up on care of skin tear. Daughter stated patient frequently had skin tears and had been treated by wound care at [facility]. Suggested again that she call  patient's primary care physician to follow up on patient's care.

## 2022-03-19 NOTE — TOC Initial Note (Signed)
Transition of Care [facility]) - Initial/Assessment Note    Patient Details  Name: [name] MRN: [id number] Date of Birth: 1928-10-25  Transition of Care [facility]) CM/SW Contact:    Shelbie Hutching, RN Phone Number: 03/19/2022, 11:02 AM  Clinical Narrative:                   Transition of Care [facility]) Screening Note   Patient Details  Name: [name] Date of Birth: 1928-03-07   Transition of Care [facility]) CM/SW Contact:    Shelbie Hutching, RN Phone Number: 03/19/2022, 11:02 AM    Transition of Care Department [facility]) has reviewed patient and no TOC needs have been identified at this time. We will continue to monitor patient advancement through interdisciplinary progression rounds. If new patient transition needs arise, please place a TOC consult.         Patient Goals and CMS Choice            Expected Discharge Plan and Services                                              Prior Living Arrangements/Services                       Activities of Daily Living Home Assistive Devices/Equipment: Cane (specify quad or straight), Oxygen (uses 3 liters at all times) ADL Screening (condition at time of admission) [name] cognitive ability adequate to safely complete daily activities?: Yes Is the patient deaf or have difficulty hearing?: No Does the patient have difficulty seeing, even when wearing glasses/contacts?: Yes (difficulty seeing out of left eye) Does the patient have difficulty concentrating, remembering, or making decisions?: No Patient able to express need for assistance with ADLs?: Yes Does the patient have difficulty dressing or bathing?: No Independently performs ADLs?: Yes (appropriate for developmental age) Does the patient have difficulty walking or climbing stairs?: Yes Weakness of Legs: Both  Permission Sought/Granted                  Emotional Assessment              Admission diagnosis:  Carotid stenosis,  right [I65.21] Patient Active Problem List   Diagnosis Date Noted   Carotid stenosis, right 03/18/2022   Carotid stenosis 03/08/2022   PVD (peripheral vascular disease) ([place]) 03/08/2022   Acute loss of vision 02/28/2022   Venous ulcer of ankle, right ([place]) 09/04/2020   Protein-calorie malnutrition, severe 08/10/2020   Atherosclerosis of artery of extremity with ulceration ([place]) 08/09/2020   Atherosclerosis of native arteries of the extremities with ulceration ([place]) 06/30/2020   Acute on chronic respiratory failure with hypoxia ([place]) 10/08/2019   Acute on chronic diastolic (congestive) heart failure ([place]) 10/08/2019   COPD (chronic obstructive pulmonary disease) ([place])    Aspiration pneumonia ([place])    Sepsis ([place])    Closed fracture of sternum    Compression fracture of thoracic vertebra ([place])    Nonthrombocytopenic purpura ([place]) 02/08/2019   Acute on chronic diastolic CHF (congestive heart failure) ([place]) 01/27/2019   Chronic heart failure with preserved ejection fraction (HFpEF) ([place]) 01/27/2019   Acute respiratory failure with hypoxia ([place]) 01/24/2019   Pneumonia due to COVID-19 virus 01/24/2019   Atrial fibrillation, chronic ([place]) 01/24/2019   Atrial fibrillation with RVR ([place]) 01/04/2019   Constipation 01/04/2019  HLD (hyperlipidemia) 01/04/2019   Hypertension    Coronary artery disease    Elevated troponin    COVID-19    PCP:  Baxter Hire, MD Pharmacy:   [facility], [place] [address] Phone: [phone] Fax: [phone]  [id number] [id number]: [phone]: [phone]    Social Determinants of Health (SDOH) Social History: SDOH Screenings   Food Insecurity: No Food Insecurity (03/01/2022)  Housing: Low Risk  (03/01/2022)  Transportation Needs: No Transportation Needs (03/01/2022)  Utilities: Not At Risk  (03/01/2022)  Tobacco Use: Medium Risk (03/19/2022)   SDOH Interventions:     Readmission Risk Interventions    10/10/2019    4:22 PM  Readmission Risk Prevention Plan  Transportation Screening Complete  PCP or Specialist Appt within 3-5 Days Complete  HRI or [place]  Social Work Consult for RA[place]anning/Counseling Complete  Palliative Care Screening Complete  Medication Review (Press photographer Complete

## 2022-03-19 NOTE — Discharge Instructions (Signed)
Follow up with Dr.Dew in 1 month  [phone]

## 2022-03-19 NOTE — Progress Notes (Signed)
1 Day Post-Op   Subjective/Chief Complaint: Patient seen in regards to a sore on his left great toe.  Had some increased redness and swelling and pain last week.  We did send in a prescription for Augmentin and he did state that the redness has gotten some better.  Fairly painful to the touch.   Objective: Vital signs in last 24 hours: Temp:  [98.1 F (36.7 C)-98.4 F (36.9 C)] 98.1 F (36.7 C) (02/20 0352) Pulse Rate:  [25-115] 115 (02/20 0900) Resp:  [13-28] 24 (02/20 0900) BP: (110-145)/(46-113) 132/65 (02/20 0900) SpO2:  [87 %-100 %] 100 % (02/20 0900) Weight:  [60.3 kg] 60.3 kg (02/19 1343) Last BM Date : 03/17/22  Intake/Output from previous day: 02/19 0701 - 02/20 0700 In: 2335.9 [P.O.:640; I.V.:1395.8; IV Piggyback:300.1] Out: 3850 [Urine:3850] Intake/Output this shift: Total I/O In: 300 [P.O.:300] Out: -   DP and PT pulses could not clearly be palpated on the left foot.  The skin is thin dry and atrophic with absent hair growth.  Some mild erythema and edema in the hallux.  Ulcerative area is present on the dorsal distal aspect of the left hallux nail bed with what appears to be the bone of the distal phalanx starting to protrude through the skin.  The area is for the most part dry with no signs of significant drainage or purulence.  Lab Results:  Recent Labs    03/19/22 0350  WBC 15.6*  HGB 10.3*  HCT 32.0*  PLT 303   BMET Recent Labs    03/18/22 1026 03/19/22 0350  NA  --  139  K  --  3.5  CL  --  95*  CO2  --  32  GLUCOSE  --  104*  BUN 35* 33*  CREATININE 1.24 1.40*  CALCIUM  --  9.1   PT/INR No results for input(s): "LABPROT", "INR" in the last 72 hours. ABG No results for input(s): "PHART", "HCO3" in the last 72 hours.  Invalid input(s): "PCO2", "PO2"  Studies/Results: PERIPHERAL VASCULAR CATHETERIZATION  Result Date: 03/18/2022 See surgical note for result.   Anti-infectives: Anti-infectives (From admission, onward)    Start      Dose/Rate Route Frequency Ordered Stop   03/18/22 1900  ceFAZolin (ANCEF) IVPB 2g/100 mL premix        2 g 200 mL/hr over 30 Minutes Intravenous Every 8 hours 03/18/22 1251 03/19/22 0419   03/18/22 1025  ceFAZolin (ANCEF) IVPB 2g/100 mL premix        2 g 200 mL/hr over 30 Minutes Intravenous 30 min pre-op 03/18/22 1025 03/18/22 1144       Assessment/Plan: s/p Procedure(s): CAROTID PTA/STENT INTERVENTION (Right) Assessment: 1.  Full-thickness ulceration left hallux with possible early osteomyelitis clinically. 2.  Peripheral vascular disease.  Plan: Apply antibiotic ointment and light dressing to the left hallux.  Discussed with the patient that we will contact Dr. Lucky Cowboy about reassessing the circulation in his left leg.  Discussed with the patient that he potentially may need amputation of a portion of the left great toe.  At this point we will wait for vascular workup which most likely can be performed outpatient as well as any potential considerations for amputation.  LOS: 1 day    Durward Fortes 03/19/2022

## 2022-03-19 NOTE — Progress Notes (Signed)
PHARMACIST - PHYSICIAN COMMUNICATION  CONCERNING: IV to Oral Route Change Policy  RECOMMENDATION: This patient is receiving famotidine by the intravenous route.  Based on criteria approved by the Pharmacy and Therapeutics Committee, the intravenous medication(s) is/are being converted to the equivalent oral dose form(s).  DESCRIPTION: These criteria include: The patient is eating (either orally or via tube) and/or has been taking other orally administered medications for a least 24 hours The patient has no evidence of active gastrointestinal bleeding or impaired GI absorption (gastrectomy, short bowel, patient on TNA or NPO).  If you have questions about this conversion, please contact the [place], [facility] 03/19/2022 8:35 AM

## 2022-03-19 NOTE — Progress Notes (Signed)
PHARMACY NOTE: RENAL DOSAGE ADJUSTMENT  Current regimen includes a mismatch between dosage and estimated renal function.   Current dosage: Famotidine 20 mg BID  Indication: Stress ulcer prophylaxis  Renal Function:  Estimated Creatinine Clearance: 29.3 mL/min (A) (by C-G formula based on SCr of 1.4 mg/dL (H)).    Antimicrobial dosage has been changed to:  Famotidine 20 mg daily   Thank you for allowing pharmacy to be a part of this patient's care.  Benita Gutter, [facility] 03/19/2022 8:35 AM

## 2022-03-20 ENCOUNTER — Telehealth (INDEPENDENT_AMBULATORY_CARE_PROVIDER_SITE_OTHER): Payer: Self-pay

## 2022-03-20 NOTE — Telephone Encounter (Signed)
Patient daughter left a message stating that her father arm was dripping blood. She reached to the hospital and was advise to use gauze on the arm. The area has started to clot and additional gauze was place.Patient daughter informed that she spoke with ICU nurse and she recommended for them to contact PCP to have a wound care referral placed.

## 2022-03-26 ENCOUNTER — Encounter: Payer: Medicare Other | Admitting: Physician Assistant

## 2022-03-26 DIAGNOSIS — D62 Acute posthemorrhagic anemia: Secondary | ICD-10-CM | POA: Diagnosis not present

## 2022-03-26 DIAGNOSIS — Z7901 Long term (current) use of anticoagulants: Secondary | ICD-10-CM | POA: Insufficient documentation

## 2022-03-26 DIAGNOSIS — Z7982 Long term (current) use of aspirin: Secondary | ICD-10-CM | POA: Insufficient documentation

## 2022-03-26 DIAGNOSIS — D500 Iron deficiency anemia secondary to blood loss (chronic): Secondary | ICD-10-CM | POA: Diagnosis not present

## 2022-03-26 DIAGNOSIS — I7389 Other specified peripheral vascular diseases: Secondary | ICD-10-CM | POA: Insufficient documentation

## 2022-03-26 DIAGNOSIS — S41111A Laceration without foreign body of right upper arm, initial encounter: Secondary | ICD-10-CM | POA: Insufficient documentation

## 2022-03-26 DIAGNOSIS — J449 Chronic obstructive pulmonary disease, unspecified: Secondary | ICD-10-CM | POA: Insufficient documentation

## 2022-03-27 ENCOUNTER — Encounter (HOSPITAL_BASED_OUTPATIENT_CLINIC_OR_DEPARTMENT_OTHER): Payer: Medicare Other | Admitting: Internal Medicine

## 2022-03-27 ENCOUNTER — Inpatient Hospital Stay
Admission: EM | Admit: 2022-03-27 | Discharge: 2022-03-29 | DRG: 812 | Disposition: A | Discharge status: Home Health | Payer: Medicare Other | Attending: Internal Medicine | Admitting: Internal Medicine

## 2022-03-27 ENCOUNTER — Emergency Department: Payer: Medicare Other

## 2022-03-27 ENCOUNTER — Other Ambulatory Visit: Payer: Self-pay

## 2022-03-27 DIAGNOSIS — I5A Non-ischemic myocardial injury (non-traumatic): Secondary | ICD-10-CM | POA: Diagnosis present

## 2022-03-27 DIAGNOSIS — Z7901 Long term (current) use of anticoagulants: Secondary | ICD-10-CM

## 2022-03-27 DIAGNOSIS — I2510 Atherosclerotic heart disease of native coronary artery without angina pectoris: Secondary | ICD-10-CM | POA: Diagnosis present

## 2022-03-27 DIAGNOSIS — J449 Chronic obstructive pulmonary disease, unspecified: Secondary | ICD-10-CM

## 2022-03-27 DIAGNOSIS — E46 Unspecified protein-calorie malnutrition: Secondary | ICD-10-CM | POA: Diagnosis present

## 2022-03-27 DIAGNOSIS — E785 Hyperlipidemia, unspecified: Secondary | ICD-10-CM | POA: Diagnosis present

## 2022-03-27 DIAGNOSIS — S41111A Laceration without foreign body of right upper arm, initial encounter: Secondary | ICD-10-CM

## 2022-03-27 DIAGNOSIS — I7389 Other specified peripheral vascular diseases: Secondary | ICD-10-CM | POA: Diagnosis not present

## 2022-03-27 DIAGNOSIS — Z9889 Other specified postprocedural states: Secondary | ICD-10-CM

## 2022-03-27 DIAGNOSIS — Z7983 Long term (current) use of bisphosphonates: Secondary | ICD-10-CM

## 2022-03-27 DIAGNOSIS — D649 Anemia, unspecified: Secondary | ICD-10-CM | POA: Diagnosis present

## 2022-03-27 DIAGNOSIS — D62 Acute posthemorrhagic anemia: Principal | ICD-10-CM | POA: Diagnosis present

## 2022-03-27 DIAGNOSIS — I130 Hypertensive heart and chronic kidney disease with heart failure and stage 1 through stage 4 chronic kidney disease, or unspecified chronic kidney disease: Secondary | ICD-10-CM | POA: Diagnosis present

## 2022-03-27 DIAGNOSIS — Z7982 Long term (current) use of aspirin: Secondary | ICD-10-CM

## 2022-03-27 DIAGNOSIS — S51011A Laceration without foreign body of right elbow, initial encounter: Secondary | ICD-10-CM | POA: Diagnosis present

## 2022-03-27 DIAGNOSIS — Z66 Do not resuscitate: Secondary | ICD-10-CM | POA: Diagnosis present

## 2022-03-27 DIAGNOSIS — Z87891 Personal history of nicotine dependence: Secondary | ICD-10-CM

## 2022-03-27 DIAGNOSIS — Z79899 Other long term (current) drug therapy: Secondary | ICD-10-CM

## 2022-03-27 DIAGNOSIS — D500 Iron deficiency anemia secondary to blood loss (chronic): Secondary | ICD-10-CM

## 2022-03-27 DIAGNOSIS — I482 Chronic atrial fibrillation, unspecified: Secondary | ICD-10-CM | POA: Diagnosis present

## 2022-03-27 DIAGNOSIS — J9611 Chronic respiratory failure with hypoxia: Secondary | ICD-10-CM | POA: Diagnosis present

## 2022-03-27 DIAGNOSIS — R8290 Unspecified abnormal findings in urine: Secondary | ICD-10-CM

## 2022-03-27 DIAGNOSIS — R54 Age-related physical debility: Secondary | ICD-10-CM

## 2022-03-27 DIAGNOSIS — Z85828 Personal history of other malignant neoplasm of skin: Secondary | ICD-10-CM

## 2022-03-27 DIAGNOSIS — H5462 Unqualified visual loss, left eye, normal vision right eye: Secondary | ICD-10-CM | POA: Diagnosis present

## 2022-03-27 DIAGNOSIS — I5032 Chronic diastolic (congestive) heart failure: Secondary | ICD-10-CM | POA: Diagnosis present

## 2022-03-27 DIAGNOSIS — I4510 Unspecified right bundle-branch block: Secondary | ICD-10-CM | POA: Diagnosis present

## 2022-03-27 DIAGNOSIS — Z9981 Dependence on supplemental oxygen: Secondary | ICD-10-CM

## 2022-03-27 DIAGNOSIS — I272 Pulmonary hypertension, unspecified: Secondary | ICD-10-CM | POA: Diagnosis present

## 2022-03-27 DIAGNOSIS — W19XXXA Unspecified fall, initial encounter: Secondary | ICD-10-CM | POA: Diagnosis present

## 2022-03-27 DIAGNOSIS — E876 Hypokalemia: Secondary | ICD-10-CM

## 2022-03-27 DIAGNOSIS — N1831 Chronic kidney disease, stage 3a: Secondary | ICD-10-CM | POA: Insufficient documentation

## 2022-03-27 DIAGNOSIS — Z8669 Personal history of other diseases of the nervous system and sense organs: Secondary | ICD-10-CM

## 2022-03-27 DIAGNOSIS — R000 Tachycardia, unspecified: Secondary | ICD-10-CM | POA: Diagnosis present

## 2022-03-27 DIAGNOSIS — Y92009 Unspecified place in unspecified non-institutional (private) residence as the place of occurrence of the external cause: Secondary | ICD-10-CM

## 2022-03-27 DIAGNOSIS — I998 Other disorder of circulatory system: Secondary | ICD-10-CM | POA: Diagnosis present

## 2022-03-27 DIAGNOSIS — I480 Paroxysmal atrial fibrillation: Secondary | ICD-10-CM | POA: Diagnosis present

## 2022-03-27 DIAGNOSIS — S51811A Laceration without foreign body of right forearm, initial encounter: Secondary | ICD-10-CM | POA: Diagnosis present

## 2022-03-27 DIAGNOSIS — N3000 Acute cystitis without hematuria: Secondary | ICD-10-CM | POA: Diagnosis present

## 2022-03-27 DIAGNOSIS — S41101D Unspecified open wound of right upper arm, subsequent encounter: Secondary | ICD-10-CM

## 2022-03-27 DIAGNOSIS — R7989 Other specified abnormal findings of blood chemistry: Secondary | ICD-10-CM | POA: Diagnosis present

## 2022-03-27 DIAGNOSIS — Z6820 Body mass index (BMI) 20.0-20.9, adult: Secondary | ICD-10-CM

## 2022-03-27 DIAGNOSIS — I739 Peripheral vascular disease, unspecified: Secondary | ICD-10-CM | POA: Diagnosis present

## 2022-03-27 DIAGNOSIS — Z91030 Bee allergy status: Secondary | ICD-10-CM

## 2022-03-27 DIAGNOSIS — R531 Weakness: Secondary | ICD-10-CM

## 2022-03-27 DIAGNOSIS — I700 Atherosclerosis of aorta: Secondary | ICD-10-CM | POA: Diagnosis present

## 2022-03-27 LAB — CBC WITH DIFFERENTIAL/PLATELET
Abs Immature Granulocytes: 0.09 10*3/uL — ABNORMAL HIGH (ref 0.00–0.07)
Basophils Absolute: 0.1 10*3/uL (ref 0.0–0.1)
Basophils Relative: 1 %
Eosinophils Absolute: 0.2 10*3/uL (ref 0.0–0.5)
Eosinophils Relative: 1 %
HCT: 24.6 % — ABNORMAL LOW (ref 39.0–52.0)
Hemoglobin: 8 g/dL — ABNORMAL LOW (ref 13.0–17.0)
Immature Granulocytes: 1 %
Lymphocytes Relative: 21 %
Lymphs Abs: 2.5 10*3/uL (ref 0.7–4.0)
MCH: 31.3 pg (ref 26.0–34.0)
MCHC: 32.5 g/dL (ref 30.0–36.0)
MCV: 96.1 fL (ref 80.0–100.0)
Monocytes Absolute: 1.6 10*3/uL — ABNORMAL HIGH (ref 0.1–1.0)
Monocytes Relative: 13 %
Neutro Abs: 7.6 10*3/uL (ref 1.7–7.7)
Neutrophils Relative %: 63 %
Platelets: 320 10*3/uL (ref 150–400)
RBC: 2.56 MIL/uL — ABNORMAL LOW (ref 4.22–5.81)
RDW: 14.4 % (ref 11.5–15.5)
WBC: 12.1 10*3/uL — ABNORMAL HIGH (ref 4.0–10.5)
nRBC: 0 % (ref 0.0–0.2)

## 2022-03-27 LAB — URINALYSIS, ROUTINE W REFLEX MICROSCOPIC
Bilirubin Urine: NEGATIVE
Glucose, UA: NEGATIVE mg/dL
Hgb urine dipstick: NEGATIVE
Ketones, ur: NEGATIVE mg/dL
Nitrite: NEGATIVE
Protein, ur: NEGATIVE mg/dL
Specific Gravity, Urine: 1.013 (ref 1.005–1.030)
WBC, UA: >50 WBC/hpf (ref 0–5)
pH: 5 (ref 5.0–8.0)

## 2022-03-27 LAB — BASIC METABOLIC PANEL
Anion gap: 10 (ref 5–15)
BUN: 39 mg/dL — ABNORMAL HIGH (ref 8–23)
CO2: 32 mmol/L (ref 22–32)
Calcium: 9.3 mg/dL (ref 8.9–10.3)
Chloride: 93 mmol/L — ABNORMAL LOW (ref 98–111)
Creatinine, Ser: 1.29 mg/dL — ABNORMAL HIGH (ref 0.61–1.24)
GFR, Estimated: 52 mL/min — ABNORMAL LOW (ref >60)
Glucose, Bld: 114 mg/dL — ABNORMAL HIGH (ref 70–99)
Potassium: 2.9 mmol/L — ABNORMAL LOW (ref 3.5–5.1)
Sodium: 135 mmol/L (ref 135–145)

## 2022-03-27 LAB — MAGNESIUM: Magnesium: 1.9 mg/dL (ref 1.7–2.4)

## 2022-03-27 LAB — BRAIN NATRIURETIC PEPTIDE: B Natriuretic Peptide: 956.9 pg/mL — ABNORMAL HIGH (ref 0.0–100.0)

## 2022-03-27 LAB — APTT: aPTT: 29 seconds (ref 24–36)

## 2022-03-27 LAB — PROTIME-INR
INR: 1.3 — ABNORMAL HIGH (ref 0.8–1.2)
Prothrombin Time: 16.5 seconds — ABNORMAL HIGH (ref 11.4–15.2)

## 2022-03-27 LAB — HEMOGLOBIN AND HEMATOCRIT, BLOOD
HCT: 23 % — ABNORMAL LOW (ref 39.0–52.0)
Hemoglobin: 7.6 g/dL — ABNORMAL LOW (ref 13.0–17.0)

## 2022-03-27 LAB — TROPONIN I (HIGH SENSITIVITY)
Troponin I (High Sensitivity): 30 ng/L — ABNORMAL HIGH (ref <18)
Troponin I (High Sensitivity): 28 ng/L — ABNORMAL HIGH (ref <18)

## 2022-03-27 MED ORDER — ACETAMINOPHEN 650 MG RE SUPP: 650.0000 mg | Freq: Four times a day (QID) | RECTAL | Status: DC | PRN

## 2022-03-27 MED ORDER — ALBUTEROL SULFATE (2.5 MG/3ML) 0.083% IN NEBU: 2.5000 mg | INHALATION_SOLUTION | RESPIRATORY_TRACT | Status: DC | PRN

## 2022-03-27 MED ORDER — ACETAMINOPHEN 325 MG PO TABS: 650.0000 mg | ORAL_TABLET | Freq: Four times a day (QID) | ORAL | Status: DC | PRN

## 2022-03-27 MED ORDER — FLUTICASONE FUROATE-VILANTEROL 100-25 MCG/ACT IN AEPB
1.0000 | INHALATION_SPRAY | Freq: Every day | RESPIRATORY_TRACT | Status: DC
  Administered 2022-03-28 – 2022-03-29 (×2): 1 via RESPIRATORY_TRACT
  Filled 2022-03-27: qty 28

## 2022-03-27 MED ORDER — HYDROCODONE-ACETAMINOPHEN 5-325 MG PO TABS: 1.0000 | ORAL_TABLET | ORAL | Status: DC | PRN

## 2022-03-27 MED ORDER — METOPROLOL TARTRATE 50 MG PO TABS
50.0000 mg | ORAL_TABLET | Freq: Two times a day (BID) | ORAL | Status: DC
  Administered 2022-03-27 – 2022-03-29 (×4): 50 mg via ORAL
  Filled 2022-03-27 (×4): qty 1

## 2022-03-27 MED ORDER — ASPIRIN 81 MG PO TBEC
81.0000 mg | DELAYED_RELEASE_TABLET | Freq: Every day | ORAL | Status: DC
  Administered 2022-03-28 – 2022-03-29 (×2): 81 mg via ORAL
  Filled 2022-03-27 (×2): qty 1

## 2022-03-27 MED ORDER — ONDANSETRON HCL 4 MG/2ML IJ SOLN: 4.0000 mg | Freq: Four times a day (QID) | INTRAMUSCULAR | Status: DC | PRN

## 2022-03-27 MED ORDER — POTASSIUM CHLORIDE CRYS ER 20 MEQ PO TBCR
40.0000 meq | EXTENDED_RELEASE_TABLET | Freq: Once | ORAL | Status: AC
  Administered 2022-03-27: 40 meq via ORAL
  Filled 2022-03-27: qty 2

## 2022-03-27 MED ORDER — DILTIAZEM HCL ER COATED BEADS 300 MG PO CP24
300.0000 mg | ORAL_CAPSULE | Freq: Every day | ORAL | Status: DC
  Administered 2022-03-27 – 2022-03-29 (×3): 300 mg via ORAL
  Filled 2022-03-27 (×4): qty 1

## 2022-03-27 MED ORDER — APIXABAN 2.5 MG PO TABS
2.5000 mg | ORAL_TABLET | Freq: Two times a day (BID) | ORAL | Status: DC
  Administered 2022-03-27 – 2022-03-29 (×4): 2.5 mg via ORAL
  Filled 2022-03-27 (×5): qty 1

## 2022-03-27 MED ORDER — SODIUM CHLORIDE 0.9 % IV SOLN
1.0000 g | Freq: Once | INTRAVENOUS | Status: AC
  Administered 2022-03-27: 1 g via INTRAVENOUS
  Filled 2022-03-27: qty 10

## 2022-03-27 MED ORDER — ORAL CARE MOUTH RINSE: 15.0000 mL | OROMUCOSAL | Status: DC | PRN

## 2022-03-27 MED ORDER — ATORVASTATIN CALCIUM 20 MG PO TABS
40.0000 mg | ORAL_TABLET | Freq: Every day | ORAL | Status: DC
  Administered 2022-03-27 – 2022-03-29 (×3): 40 mg via ORAL
  Filled 2022-03-27 (×3): qty 2

## 2022-03-27 MED ORDER — UMECLIDINIUM BROMIDE 62.5 MCG/ACT IN AEPB
1.0000 | INHALATION_SPRAY | Freq: Every day | RESPIRATORY_TRACT | Status: DC
  Administered 2022-03-28 – 2022-03-29 (×2): 1 via RESPIRATORY_TRACT
  Filled 2022-03-27: qty 7

## 2022-03-27 MED ORDER — SODIUM CHLORIDE 0.9 % IV BOLUS
500.0000 mL | Freq: Once | INTRAVENOUS | Status: AC
  Administered 2022-03-27: 500 mL via INTRAVENOUS

## 2022-03-27 MED ORDER — TORSEMIDE 20 MG PO TABS
20.0000 mg | ORAL_TABLET | Freq: Every day | ORAL | Status: DC
  Administered 2022-03-27 – 2022-03-29 (×3): 20 mg via ORAL
  Filled 2022-03-27 (×3): qty 1

## 2022-03-27 MED ORDER — ONDANSETRON HCL 4 MG PO TABS: 4.0000 mg | ORAL_TABLET | Freq: Four times a day (QID) | ORAL | Status: DC | PRN

## 2022-03-27 MED ORDER — SENNOSIDES-DOCUSATE SODIUM 8.6-50 MG PO TABS: 1.0000 | ORAL_TABLET | Freq: Every evening | ORAL | Status: DC | PRN

## 2022-03-27 NOTE — Assessment & Plan Note (Addendum)
Physical therapy recommended home health

## 2022-03-27 NOTE — ED Provider Notes (Signed)
[facility] Provider Note    Event Date/Time   First MD Initiated Contact with Patient 03/27/22 1547     (approximate)   History   Chief Complaint Arm Injury   HPI  KAVON SALAMA is a 89 y.o. male with past medical history of hypertension, hyperlipidemia, CAD, atrial fibrillation on Eliquis, CHF, COPD, chronic hypoxic respiratory failure on 4 L nasal cannula, and PAD who presents to the ED complaining of arm injury.  Per daughter, patient suffered a skin tear to his right forearm while admitted to the hospital for carotid stent placement.  He was discharged home about 1 week ago, has had intermittent oozing of blood from the forearm since then.  Daughter reports that he was referred to the wound clinic by his PCP, seen there yesterday and had cauterization with silver nitrate but continued to have bleeding since then.  He was seen again earlier today and had Surgicel dressing placed.  Daughter reports that patient has been increasingly weak with poor appetite since leaving the hospital, was referred to the ED for further evaluation.  He has not had any fevers, does endorse a cough and some difficulty breathing worse than his baseline.  Daughter reports he has not wanted to eat much, but has not had any abdominal pain, vomiting, diarrhea, or dysuria.  He continues to take Eliquis and daily baby aspirin for anticoagulation.     Physical Exam   Triage Vital Signs: ED Triage Vitals  Enc Vitals Group     BP 03/27/22 1159 109/62     Pulse Rate 03/27/22 1159 83     Resp 03/27/22 1159 (!) 22     Temp 03/27/22 1200 97.6 F (36.4 C)     Temp Source 03/27/22 1200 Oral     SpO2 03/27/22 1159 92 %     Weight --      Height --      Head Circumference --      Peak Flow --      Pain Score 03/27/22 1154 0     Pain Loc --      Pain Edu? --      Excl. in [place]? --     Most recent vital signs: Vitals:   03/27/22 1850 03/27/22 1900  BP: 106/64   Pulse: 84   Resp:  19   Temp:  98 F (36.7 C)  SpO2: 100%     Constitutional: Alert and oriented. Eyes: Conjunctivae are normal. Head: Atraumatic. Nose: No congestion/rhinnorhea. Mouth/Throat: Mucous membranes are moist.  Cardiovascular: Normal rate, regular rhythm. Grossly normal heart sounds.  Radial pulse not palpable on right, biphasic Doppler signals obtained.  Faint palpable radial pulse on left.  Upper extremities with purple discoloration to hands bilaterally, cap refill approximately 5 seconds in digits of bilateral upper extremities. Respiratory: Normal respiratory effort.  No retractions. Lungs CTAB. Gastrointestinal: Soft and nontender. No distention. Musculoskeletal: No lower extremity tenderness nor edema.  Right proximal forearm with Surgicel in place, no active bleeding noted.  No surrounding erythema, warmth, or tenderness noted. Neurologic:  Normal speech and language. No gross focal neurologic deficits are appreciated.    ED Results / Procedures / Treatments   Labs (all labs ordered are listed, but only abnormal results are displayed) Labs Reviewed  CBC WITH DIFFERENTIAL/PLATELET - Abnormal; Notable for the following components:      Result Value   WBC 12.1 (*)    RBC 2.56 (*)    Hemoglobin 8.0 (*)  HCT 24.6 (*)    Monocytes Absolute 1.6 (*)    Abs Immature Granulocytes 0.09 (*)    All other components within normal limits  BRAIN NATRIURETIC PEPTIDE - Abnormal; Notable for the following components:   B Natriuretic Peptide 956.9 (*)    All other components within normal limits  PROTIME-INR - Abnormal; Notable for the following components:   Prothrombin Time 16.5 (*)    INR 1.3 (*)    All other components within normal limits  BASIC METABOLIC PANEL - Abnormal; Notable for the following components:   Potassium 2.9 (*)    Chloride 93 (*)    Glucose, Bld 114 (*)    BUN 39 (*)    Creatinine, Ser 1.29 (*)    GFR, Estimated 52 (*)    All other components within normal limits   URINALYSIS, ROUTINE W REFLEX MICROSCOPIC - Abnormal; Notable for the following components:   Color, Urine YELLOW (*)    APPearance HAZY (*)    Leukocytes,Ua LARGE (*)    Bacteria, UA RARE (*)    All other components within normal limits  TROPONIN I (HIGH SENSITIVITY) - Abnormal; Notable for the following components:   Troponin I (High Sensitivity) 30 (*)    All other components within normal limits  TROPONIN I (HIGH SENSITIVITY) - Abnormal; Notable for the following components:   Troponin I (High Sensitivity) 28 (*)    All other components within normal limits  URINE CULTURE  APTT  MAGNESIUM  HEMOGLOBIN AND HEMATOCRIT, BLOOD  HEMOGLOBIN AND HEMATOCRIT, BLOOD  HEMOGLOBIN AND HEMATOCRIT, BLOOD     EKG  ED ECG REPORT I, Blake Divine, the attending physician, personally viewed and interpreted this ECG.   Date: 03/27/2022  EKG Time: 16:54  Rate: 93  Rhythm: atrial fibrillation  Axis: LAD  Intervals:right bundle branch block, prolonged QT  ST&T Change: None  RADIOLOGY Chest x-ray reviewed and interpreted by me with no infiltrate, edema, or effusion.  PROCEDURES:  Critical Care performed: No  Procedures   MEDICATIONS ORDERED IN ED: Medications  sodium chloride 0.9 % bolus 500 mL (0 mLs Intravenous Stopped 03/27/22 1902)  cefTRIAXone (ROCEPHIN) 1 g in sodium chloride 0.9 % 100 mL IVPB (1 g Intravenous New Bag/Given 03/27/22 2000)     IMPRESSION / MDM / ASSESSMENT AND PLAN / ED COURSE  I reviewed the triage vital signs and the nursing notes.                              88 y.o. male with past medical history of hypertension, hyperlipidemia, CAD, atrial fibrillation on Eliquis, PAD, CHF, and COPD who presents to the ED complaining of increasing generalized weakness and poor appetite while dealing with ongoing bleeding from right proximal forearm skin tear.  [name] presentation is most consistent with acute presentation with potential threat to life or bodily  function.  Differential diagnosis includes, but is not limited to, anemia, electrolyte abnormality, AKI, cellulitis, arterial insufficiency, pneumonia, COPD exacerbation, CHF, UTI.  Patient chronically ill-appearing but nontoxic and in no acute distress, vital signs are unremarkable.  He has no ongoing bleeding to skin tear at his right forearm, no associated signs of infection.  He does appear to have arterial insufficiency to his bilateral upper extremities, denies any pain currently but does report that he has occasional burning discomfort that improves when he moves his hands around.  He states that his hands are often purple and discolored until  he begins to move them.  Radial pulse is not palpable but I am able to obtain good Doppler signals, he likely has arterial insufficiency exacerbated by tight dressings to his right forearm recently.  Findings reviewed with Dr. Lucky Cowboy of vascular surgery, who agrees that outpatient follow-up would be appropriate given low concern for acute limb ischemia given no pain currently.  Patient does have downtrending hemoglobin at 8.0 today compared to 10.3 8 days ago.  No ongoing bleeding at this time and while anemia could be causing his symptoms, do not feel he requires transfusion with hemoglobin greater than 7.  We will assess for alternative cause for his weakness with chest x-ray, urinalysis, and BMP.  Chest x-ray is unremarkable, BMP shows stable chronic kidney disease with hypokalemia which we will replete.  2 sets of troponin are stable, patient does have a UTI which is likely contributing to his generalized weakness.  We will treat with Rocephin and given patient's significant weakness, case discussed with hospitalist for admission.      FINAL CLINICAL IMPRESSION(S) / ED DIAGNOSES   Final diagnoses:  Generalized weakness  Blood loss anemia  Acute cystitis without hematuria     Rx / DC Orders   ED Discharge Orders     None        Note:  This  document was prepared using Dragon voice recognition software and may include unintentional dictation errors.   Blake Divine, MD 03/27/22 2107

## 2022-03-27 NOTE — Assessment & Plan Note (Signed)
Continue diltiazem, metoprolol and apixaban

## 2022-03-27 NOTE — Assessment & Plan Note (Signed)
Likely secondary to supply/demand mismatch related to acute anemia

## 2022-03-27 NOTE — Assessment & Plan Note (Signed)
Patient had a 90% right ICA stenosis. Continue aspirin, Eliquis and atorvastatin

## 2022-03-27 NOTE — Assessment & Plan Note (Signed)
Continue supplemental oxygen. 

## 2022-03-27 NOTE — Assessment & Plan Note (Addendum)
Wound right forearm/oozing from skin tear. No further bleeding.  Follow-up at the wound care center.  Keep dressing on until then.  Hemoglobin up to 7.9 after transfusion.  Patient also received IV iron.

## 2022-03-27 NOTE — Assessment & Plan Note (Addendum)
Wheeze heard this morning but cleared up pretty quickly with nebulizer treatment.  I did give a dose of steroid but will not continue the steroid upon disposition since the lungs cleared up quickly. Continue Trelegy Ellipta

## 2022-03-27 NOTE — Assessment & Plan Note (Signed)
BNP slightly elevated and chest x-ray showing stable cardiomegaly Continue torsemide, metoprolol, diltiazem, Daily weights

## 2022-03-27 NOTE — Assessment & Plan Note (Addendum)
Added on a urine culture which so far is showing no growth.  Patient received 2 days of Rocephin here.  Urine analysis white blood cell count greater than 50 and squamous epithelial cells 0-5.  Empirically given a few more days of Keflex upon going home.  This will also cover right arm.

## 2022-03-27 NOTE — Progress Notes (Addendum)
[name], [name] ([id number]) [id number] Page 1 of 9 Visit Report for 03/26/2022 Allergy List Details Patient Name: Date of Service: [name], [name]. 03/26/2022 12:45 PM Medical Record Number: [id number] Patient Account Number: [id number] Date of Birth/Sex: Treating RN: Mar 22, 1928 (89 y.o. [name] Primary Care [name] in Treatment: 0 Allergies Active Allergies bee venom protein (honey bee) Reaction: anaphylaxis Severity: Severe Allergy Notes Electronic Signature(s) Signed: 03/26/2022 12:31:49 PM By: Lawanda Cousins Entered By: Lawanda Cousins on 03/26/2022 08:16:39 -------------------------------------------------------------------------------- Arrival Information Details Patient Name: Date of Service: [name], [name]. 03/26/2022 12:45 PM Medical Record Number: [id number] Patient Account Number: [id number] Date of Birth/Sex: Treating RN: 12-11-1928 (89 y.o. [name] Primary Care Dimitra Woodstock: [name] Other Clinician: Referring Matyas Baisley: Treating [name]/Extender: [name] in Treatment: 0 Visit Information Patient Arrived: Walker Arrival Time: 13:01 Accompanied By: daughter Transfer Assistance: None Patient Identification Verified: Yes Secondary Verification Process Completed: Yes Electronic Signature(s) Signed: 03/28/2022 9:52:22 AM By: Lawanda Cousins Entered By: Lawanda Cousins on 03/26/2022 13:11:19 [name] ([id number]) [id number] Page 2 of 9 -------------------------------------------------------------------------------- Clinic Level of Care Assessment Details Patient Name: Date of Service: [name], [name]. 03/26/2022 12:45 PM Medical Record Number: [id number] Patient Account Number: [id number] Date of Birth/Sex: Treating RN: Aug 15, 1928 (89 y.o.  [name], [place] Primary Care [name]: [name] Other Clinician: Referring Shakiyla Kook: Treating [name]/Extender: [name] in Treatment: 0 Clinic Level of Care Assessment Items TOOL 1 Quantity Score '[]'$  - 0 Use when [name] and Procedure is performed on INITIAL visit ASSESSMENTS - Nursing Assessment / Reassessment X- 1 20 General Physical Exam (combine w/ comprehensive assessment (listed just below) when performed on new pt. evals) X- 1 25 Comprehensive Assessment (HX, ROS, Risk Assessments, Wounds Hx, etc.) ASSESSMENTS - Wound and Skin Assessment / Reassessment '[]'$  - 0 Dermatologic / Skin Assessment (not related to wound area) ASSESSMENTS - Ostomy and/or Continence Assessment and Care '[]'$  - 0 Incontinence Assessment and Management '[]'$  - 0 Ostomy Care Assessment and Management (repouching, etc.) PROCESS - Coordination of Care X - Simple Patient / Family Education for ongoing care 1 15 '[]'$  - 0 Complex (extensive) Patient / Family Education for ongoing care X- 1 10 Staff obtains Programmer, systems, Records, T Results / Process Orders est '[]'$  - 0 Staff telephones HHA, Nursing Homes / Clarify orders / etc '[]'$  - 0 Routine Transfer to another Facility (non-emergent condition) '[]'$  - 0 Routine Hospital Admission (non-emergent condition) X- 1 15 New Admissions / Biomedical engineer / Ordering NPWT Apligraf, etc. , '[]'$  - 0 Emergency Hospital Admission (emergent condition) PROCESS - Special Needs '[]'$  - 0 Pediatric / Minor Patient Management '[]'$  - 0 Isolation Patient Management '[]'$  - 0 Hearing / Language / Visual special needs '[]'$  - 0 Assessment of Community assistance (transportation, D/C planning, etc.) '[]'$  - 0 Additional assistance / Altered mentation '[]'$  - 0 Support Surface(s) Assessment (bed, cushion, seat, etc.) INTERVENTIONS - Miscellaneous '[]'$  - 0 External ear exam '[]'$  - 0 Patient Transfer (multiple staff / Civil Service fast streamer / Similar devices) X- 1 5 Simple  Staple / Suture removal (25 or less) '[]'$  - 0 Complex Staple / Suture removal (26 or more) '[]'$  - 0 Hypo/Hyperglycemic Management (do not check if billed separately) [name], [name] ([id number]) [phone].pdf Page 3 of 9 '[]'$  - 0 Ankle / Brachial Index (ABI) - do not check if billed separately Has the patient been  seen at the hospital within the last three years: Yes Total Score: 90 Level Of Care: New/Established - Level 3 Electronic Signature(s) Signed: 03/28/2022 9:52:22 AM By: Lawanda Cousins Entered By: Lawanda Cousins on 03/26/2022 15:09:43 -------------------------------------------------------------------------------- Encounter Discharge Information Details Patient Name: Date of Service: [name], [name]. 03/26/2022 12:45 PM Medical Record Number: [id number] Patient Account Number: [id number] Date of Birth/Sex: Treating RN: Sep 27, 1928 (89 y.o. [name], [place] Primary Care [name]: [name] Other Clinician: Referring [name]: Treating [name]/Extender: [name] in Treatment: 0 Encounter Discharge Information Items Post Procedure Vitals Discharge Condition: Stable Temperature (F): 98.4 Ambulatory Status: Wheelchair Pulse (bpm): 75 Schedule Follow-up Appointment: No Respiratory Rate (breaths/min): 22 Clinical Summary of Care: Blood Pressure (mmHg): 132/72 Electronic Signature(s) Signed: 03/28/2022 9:52:22 AM By: Lawanda Cousins Entered By: Lawanda Cousins on 03/26/2022 15:13:30 -------------------------------------------------------------------------------- Lower Extremity Assessment Details Patient Name: Date of Service: [name], [name]. 03/26/2022 12:45 PM Medical Record Number: [id number] Patient Account Number: [id number] Date of Birth/Sex: Treating RN: 12-05-1928 (89 y.o. [name], [place] Primary Care [name]: [name] Other Clinician: Referring [name]: Treating [name]/Extender: [name] in Treatment: 0 Electronic Signature(s) Signed: 03/28/2022 9:52:22 AM By: Lawanda Cousins Entered By: Lawanda Cousins on 03/26/2022 14:25:15 [name] ([id number]) [id number] Page 4 of 9 -------------------------------------------------------------------------------- Multi Wound Chart Details Patient Name: Date of Service: [name], [name]. 03/26/2022 12:45 PM Medical Record Number: [id number] Patient Account Number: [id number] Date of Birth/Sex: Treating RN: 1928-10-01 (89 y.o. [name], [place] Primary Care Jaken Fregia: [name] Other Clinician: Referring Tyreik Delahoussaye: Treating Francesa Eugenio/Extender: [name] in Treatment: 0 Vital Signs Height(in): 69 Pulse(bpm): 75 Weight(lbs): 138 Blood Pressure(mmHg): 132/72 Body Mass Index(BMI): 20.4 Temperature(F): 98.4 Respiratory Rate(breaths/min): 22 [1:Photos:] [N/A:N/A] Right Upper Arm N/A N/A Wound Location: Trauma N/A N/A Wounding Event: Skin T ear N/A N/A Primary Etiology: Chronic Obstructive Pulmonary N/A N/A Comorbid History: Disease (COPD), Arrhythmia, Congestive Heart Failure, Coronary Artery Disease, Hypertension, Peripheral Arterial Disease 03/20/2022 N/A N/A Date Acquired: 0 N/A N/A Weeks of Treatment: Open N/A N/A Wound Status: No N/A N/A Wound Recurrence: 4x5.5x0.1 N/A N/A Measurements Good x W x D (cm) 17.279 N/A N/A A (cm) : rea 1.728 N/A N/A Volume (cm) : Partial Thickness N/A N/A Classification: Large N/A N/A Exudate A mount: Sanguinous N/A N/A Exudate Type: red N/A N/A Exudate Color: Distinct, outline attached N/A N/A Wound Margin: None Present (0%) N/A N/A Granulation A mount: None Present (0%) N/A N/A Necrotic A mount: Fascia: No N/A N/A Exposed Structures: Fat Layer (Subcutaneous Tissue): No Tendon: No Muscle: No Joint: No Bone: No Limited to Skin Breakdown None N/A N/A Epithelialization: Treatment Notes Electronic  Signature(s) Signed: 03/28/2022 9:52:22 AM By: Lawanda Cousins Entered By: Lawanda Cousins on 03/26/2022 14:29:45 [name] ([id number]) [id number] Page 5 of 9 -------------------------------------------------------------------------------- Multi-Disciplinary Care Plan Details Patient Name: Date of Service: [name], [name]. 03/26/2022 12:45 PM Medical Record Number: [id number] Patient Account Number: [id number] Date of Birth/Sex: Treating RN: 10-21-1928 (89 y.o. [name], [place] Primary Care Brealynn Contino: [name] Other Clinician: Referring Yamina Lenis: Treating Aneya Daddona/Extender: [name] in Treatment: 0 Active Inactive Nutrition Nursing Diagnoses: Imbalanced nutrition Goals: Patient/caregiver agrees to and verbalizes understanding of need to obtain nutritional consultation Date Initiated: 03/26/2022 Target Resolution Date: 04/22/2022 Goal Status: Active Patient/caregiver agrees to and verbalizes understanding of need to use nutritional supplements and/or vitamins as prescribed Date Initiated: 03/26/2022 Target Resolution Date: 04/22/2022 Goal Status: Active Interventions: Assess patient  nutrition upon admission and as needed per policy Provide education on nutrition Notes: Orientation to the Wound Care Program Nursing Diagnoses: Knowledge deficit related to the wound healing center program Goals: Patient/caregiver will verbalize understanding of the [place] Date Initiated: 03/26/2022 Target Resolution Date: 03/26/2022 Goal Status: Active Interventions: Provide education on orientation to the wound center Notes: Wound/Skin Impairment Nursing Diagnoses: Impaired tissue integrity Knowledge deficit related to ulceration/compromised skin integrity Goals: Patient/caregiver will verbalize understanding of skin care regimen Date Initiated: 03/26/2022 Target Resolution Date: 05/27/2022 Goal Status:  Active Ulcer/skin breakdown will have a volume reduction of 30% by week 4 Date Initiated: 03/26/2022 Target Resolution Date: 04/22/2022 Goal Status: Active Ulcer/skin breakdown will have a volume reduction of 50% by week 8 Date Initiated: 03/26/2022 Target Resolution Date: 05/27/2022 Goal Status: Active Ulcer/skin breakdown will have a volume reduction of 80% by week 12 [name], [name] ([id number]) [id number] Page 6 of 9 Date Initiated: 03/26/2022 Target Resolution Date: 06/24/2022 Goal Status: Active Interventions: Assess patient/caregiver ability to obtain necessary supplies Assess patient/caregiver ability to perform ulcer/skin care regimen upon admission and as needed Notes: Electronic Signature(s) Signed: 03/28/2022 9:52:22 AM By: Lawanda Cousins Entered By: Lawanda Cousins on 03/26/2022 15:11:04 -------------------------------------------------------------------------------- Pain Assessment Details Patient Name: Date of Service: [name], [name]. 03/26/2022 12:45 PM Medical Record Number: [id number] Patient Account Number: [id number] Date of Birth/Sex: Treating RN: [birth year] (89 y.o. [name], [place] Primary Care Tyler Robidoux: [name] Other Clinician: Referring Nicklaus Alviar: Treating Mayerli Kirst/Extender: [name] in Treatment: 0 Active Problems Location of Pain Severity and Description of Pain Patient Has Paino No Site Locations Pain Management and Medication Current Pain Management: Electronic Signature(s) Signed: 03/28/2022 9:52:22 AM By: Lawanda Cousins Entered By: Lawanda Cousins on 03/26/2022 13:11:27 [name] ([id number]) [id number].pdf Page 7 of 9 -------------------------------------------------------------------------------- Patient/Caregiver Education Details Patient Name: Date of Service: [name], [name]. 2/27/2024andnbsp12:45 PM Medical Record Number: [id number] Patient Account  Number: [id number] Date of Birth/Gender: Treating RN: 26-Feb-1928 (89 y.o. [name], [place] Primary Care Physician: [name] Other Clinician: Referring Physician: Treating Physician/Extender: [name] in Treatment: 0 Education Assessment Education Provided To: Patient daughter Education Topics Provided Nutrition: Handouts: Nutrition Methods: Demonstration Responses: State content correctly Welcome T [facility] Patient Packet: o Handouts: Welcome T [facility] o Methods: Explain/Verbal Responses: State content correctly Wound/Skin Impairment: Handouts: Caring for Your Ulcer Methods: Explain/Verbal Responses: State content correctly Electronic Signature(s) Signed: 03/28/2022 9:52:22 AM By: Lawanda Cousins Entered By: Lawanda Cousins on 03/26/2022 15:11:50 -------------------------------------------------------------------------------- Wound Assessment Details Patient Name: Date of Service: [name], [name]. 03/26/2022 12:45 PM Medical Record Number: [id number] Patient Account Number: [id number] Date of Birth/Sex: Treating RN: 1928-03-15 (89 y.o. [name], [place] Primary Care Aryam Zhan: [name] Other Clinician: Referring Anacleto Batterman: Treating Kingson Lohmeyer/Extender: [name] in Treatment: 0 Wound Status Wound Number: 1 Primary Skin Tear Etiology: Wound Location: Right Upper Arm Wound Open Wounding Event: Trauma [name], [name] ([id number]) [id number] Page 8 of 9 Wounding Event: Trauma Status: Date Acquired: 03/20/2022 Comorbid Chronic Obstructive Pulmonary Disease (COPD), Arrhythmia, Weeks Of Treatment: 0 History: Congestive Heart Failure, Coronary Artery Disease, Hypertension, Clustered Wound: No Peripheral Arterial Disease Photos Wound Measurements Length: (cm) 4 Width: (cm) 5.5 Depth: (cm) 0.1 Area: (cm) 17.279 Volume: (cm) 1.728 % Reduction in Area: %  Reduction in Volume: Epithelialization: None Tunneling: No Undermining: No Wound Description Classification: Partial Thickness Wound Margin: Distinct, outline attached Exudate Amount: Large Exudate Type:  Sanguinous Exudate Color: red Foul Odor After Cleansing: No Slough/Fibrino No Wound Bed Granulation Amount: None Present (0%) Exposed Structure Necrotic Amount: None Present (0%) Fascia Exposed: No Fat Layer (Subcutaneous Tissue) Exposed: No Tendon Exposed: No Muscle Exposed: No Joint Exposed: No Bone Exposed: No Limited to Skin Breakdown Electronic Signature(s) Signed: 03/28/2022 9:52:22 AM By: Lawanda Cousins Entered By: Lawanda Cousins on 03/26/2022 14:25:00 -------------------------------------------------------------------------------- Vitals Details Patient Name: Date of Service: [name], [name]. 03/26/2022 12:45 PM Medical Record Number: [id number] Patient Account Number: [id number] Date of Birth/Sex: Treating RN: [birth year] (89 y.o. [name], [place] Primary Care Lekeisha Arenas: [name] Other Clinician: Referring Anais Denslow: Treating Hriday Stai/Extender: [name] in Treatment: 0 Vital Signs Time Taken: 13:11 Temperature (F): 98.4 Height (in): 69 Pulse (bpm): 75 [name], [name] ([id number]) [id number].pdf Page 9 of 9 Source: Stated Respiratory Rate (breaths/min): 22 Weight (lbs): 138 Blood Pressure (mmHg): 132/72 Source: Stated Reference Range: 80 - 120 mg / dl Body Mass Index (BMI): 20.4 Electronic Signature(s) Signed: 03/28/2022 9:52:22 AM By: Lawanda Cousins Entered By: Lawanda Cousins on 03/26/2022 13:12:42

## 2022-03-27 NOTE — Assessment & Plan Note (Signed)
Seen by vascular team and patient has good pulses.  Continue anticoagulation.

## 2022-03-27 NOTE — H&P (Signed)
History and Physical    Patient: [name] [id number] DOB: 1928/09/11 DOA: 03/27/2022 DOS: the patient was seen and examined on 03/27/2022 PCP: Baxter Hire, MD  Patient coming from: Home  Chief Complaint:  Chief Complaint  Patient presents with   Arm Injury    HPI: [name] is a 89 y.o. male with medical history significant for HFpEF with last EF of 60-65 % 02/28/2022, COPD and severe pulmonary hypertension with chronic hypoxic respiratory failure on 3 L, paroxysmal A-fib on Eliquis, coronary artery disease, , hypertension, hyperlipidemia admitted on 2/1 to 2/2 with acute left vision loss secondary to central retinal artery occlusion with workup showing a 90% right ICA stenosis, for which he underwent right carotid angioplasty with stent placement on 03/19/2022 and started on aspirin in addition to his Eliquis, who presents to the ED with persistent oozing from an wound on his arm that he sustained while in the hospital.  Following his discharge she had intermittent oozing and he went to the wound clinic the day prior and it was cauterized with silver nitrate however the oozing continued and a Surgicel dressing was placed today which appeared to stop the bleeding.  He was brought to the emergency room because in addition to the earlier oozing, patient has been increasingly weak and with poor appetite and has had some breathing difficulty beyond his baseline.  He has not had a cough, chest pain, fever or chills and denies abdominal pain, vomiting, diarrhea or dysuria.  He continues to take his Eliquis and baby aspirin. ED course and data review: On arrival afebrile, BP 109/62 with pulse 83, respirations 22 and O2 sat 92% on oxygen at home flow rate.  Labs notable for troponin 28, BNP 956.  Hemoglobin 8 down from 10 a week prior WBC 12,100.  Potassium 2.9,Creatinine 1.29 which is above baseline.  Urinalysis showing large leukocyte esterase.  INR 1.3 EKG, personally viewed and  interpreted shows sinus tachycardia at 106 with RBBB chest x-ray shows stable cardiomegaly otherwise nonacute. Patient was treated with a 1 L NS bolus and started on ceftriaxone for possible UTI The ED provider noted that patient had a dusky appearance of bilateral upper extremities on physical exam.  Radial pulses were both dopplerable.  He discussed this with vascular surgeon, Dr. Lucky Cowboy who will see patient if admitted. Hospitalist consulted for admission.   Review of Systems: As mentioned in the history of present illness. All other systems reviewed and are negative.  Past Medical History:  Diagnosis Date   (HFpEF) heart failure with preserved ejection fraction (HCC)    Aortic atherosclerosis ([place])    Basal cell carcinoma 09/13/2021   Left cheek. EDC   Cardiac murmur    Grade I/VI medium pitched mid systolic blowing at lower LSB   COPD (chronic obstructive pulmonary disease) (HCC)    Coronary artery disease    Dependence on supplemental oxygen    HLD (hyperlipidemia)    Hypertension    Paroxysmal atrial fibrillation (HCC)    PVD (peripheral vascular disease) (HCC)    Skin cancer    nose and ear - tx with Mohs   Thoracic aortic ectasia (HCC)    a.) measured 3.6 x 3.3 cm on 10/08/2019   Past Surgical History:  Procedure Laterality Date   CAROTID PTA/STENT INTERVENTION Right 03/18/2022   Procedure: CAROTID PTA/STENT INTERVENTION;  Surgeon: Algernon Huxley, MD;  Location: [facility];  Service: Cardiovascular;  Laterality: Right;   COLONOSCOPY  2007  ENDARTERECTOMY FEMORAL Right 08/09/2020   Procedure: RIGHT COMMON AND PROFUNDA FEMORAL ENDARTERECTOMY;  Surgeon: Algernon Huxley, MD;  Location: [facility];  Service: Vascular;  Laterality: Right;   EYE SURGERY Bilateral 2005   LOWER EXTREMITY ANGIOGRAPHY Right 07/05/2020   Procedure: LOWER EXTREMITY ANGIOGRAPHY;  Surgeon: Algernon Huxley, MD;  Location: [facility];  Service: Cardiovascular;  Laterality: Right;   Renal Bypass  Surgery Bilateral 2000   Social History:  reports that he quit smoking about 27 years ago. His smoking use included cigarettes. He has a 30.00 pack-year smoking history. He has never used smokeless tobacco. He reports that he does not drink alcohol and does not use drugs.  Allergies  Allergen Reactions   Bee Venom Anaphylaxis    History reviewed. No pertinent family history.  Prior to Admission medications   Medication Sig Start Date End Date Taking? Authorizing Provider  acetaminophen (TYLENOL) 500 MG tablet Take 500 mg by mouth every 6 (six) hours as needed for fever or pain.    [provider]  alendronate (FOSAMAX) 70 MG tablet Take 70 mg by mouth once a week. Sunday morning with full glass of water and sit up do not lay down 06/07/20   [provider]  apixaban (ELIQUIS) 2.5 MG TABS tablet Take 2.5 mg by mouth 2 (two) times daily.    [provider]  ascorbic acid (VITAMIN C) 250 MG tablet Take 1 tablet (250 mg total) by mouth 2 (two) times daily. 08/11/20   Stegmayer, Janalyn Harder, PA-C  aspirin EC 81 MG EC tablet Take 1 tablet (81 mg total) by mouth daily at 6 (six) AM. Swallow whole. 08/11/20   Stegmayer, Joelene Millin A, PA-C  atorvastatin (LIPITOR) 40 MG tablet Take 1 tablet (40 mg total) by mouth daily. 03/01/22 05/30/22  Val Riles, MD  Calcium Carb-Cholecalciferol (CALCIUM 600/VITAMIN D PO) Take by mouth.    [provider]  CARTIA XT 300 MG 24 hr capsule Take 300 mg by mouth daily. 09/16/19   [provider]  ciclopirox (LOPROX) 0.77 % cream Apply topically 2 (two) times daily. Apply to feet BID x 1 month. Then QW thereafter. Patient not taking: Reported on 03/18/2022 07/25/21   Laurence Ferrari, [place], MD  ciclopirox (LOPROX) 0.77 % SUSP Apply once daily to affected fingernails Patient not taking: Reported on 03/18/2022 08/01/21   Laurence Ferrari, [place], MD  Fluticasone-Umeclidin-Vilant (TRELEGY ELLIPTA) 100-62.5-25 MCG/INH AEPB Inhale into the lungs.    [provider]  metoprolol tartrate (LOPRESSOR) 50 MG tablet Take 50 mg by mouth 2 (two) times daily.    [provider]  Multiple Vitamin (MULTIVITAMIN WITH MINERALS) TABS tablet Take 1 tablet by mouth daily. 08/12/20   Stegmayer, Janalyn Harder, PA-C  mupirocin ointment (BACTROBAN) 2 % Apply 1 Application topically daily. Apply to wounds once daily until healed. Patient not taking: Reported on 03/18/2022 07/25/21   Laurence Ferrari, [place], MD  potassium chloride (KLOR-CON) 10 MEQ tablet Take 2 tablets (20 mEq total) by mouth daily. Patient taking differently: Take 10-20 mEq by mouth daily. 01/14/20   Alisa Graff, FNP  torsemide (DEMADEX) 20 MG tablet Take '40mg'$  on M, W, F and '20mg'$  on Tu, Thur,Sat & Sunday Patient taking differently: Take 20-40 mg by mouth daily. Take '40mg'$  on M, F and '20mg'$  on Tu, Thur, Sat & Sunday 01/11/22   Alisa Graff, FNP  Vitamin D, Ergocalciferol, (DRISDOL) 1.25 MG (50000 UNIT) CAPS capsule Take 1 capsule (50,000 Units total) by mouth every 7 (  seven) days. 10/15/19   Wouk, Ailene Rud, MD  Wheat Dextrin (BENEFIBER PO) Take by mouth. 2 teaspoons twice a day    [provider]    Physical Exam: Vitals:   03/27/22 1200 03/27/22 1600 03/27/22 1850 03/27/22 1900  BP:  112/66 106/64   Pulse:  93 84   Resp:  16 19   Temp: 97.6 F (36.4 C) 97.6 F (36.4 C)  98 F (36.7 C)  TempSrc: Oral     SpO2:  97% 100%    Physical Exam Vitals and nursing note reviewed.  Constitutional:      General: He is not in acute distress. HENT:     Head: Normocephalic and atraumatic.  Cardiovascular:     Rate and Rhythm: Normal rate and regular rhythm.     Heart sounds: Normal heart sounds.  Pulmonary:     Effort: Pulmonary effort is normal.     Breath sounds: Normal breath sounds.  Abdominal:     Palpations: Abdomen is soft.     Tenderness: There is no abdominal tenderness.  Skin:    Findings: Bruising present.  Neurological:     Mental Status: Mental status is at  baseline.     Labs on Admission: I have personally reviewed following labs and imaging studies  CBC: Recent Labs  Lab 03/27/22 1340  WBC 12.1*  NEUTROABS 7.6  HGB 8.0*  HCT 24.6*  MCV 96.1  PLT 99991111   Basic Metabolic Panel: Recent Labs  Lab 03/27/22 1339 03/27/22 1700  NA 135  --   K 2.9*  --   CL 93*  --   CO2 32  --   GLUCOSE 114*  --   BUN 39*  --   CREATININE 1.29*  --   CALCIUM 9.3  --   MG  --  1.9   GFR: Estimated Creatinine Clearance: 31.8 mL/min (A) (by C-G formula based on SCr of 1.29 mg/dL (H)). Liver Function Tests: No results for input(s): "AST", "ALT", "ALKPHOS", "BILITOT", "PROT", "ALBUMIN" in the last 168 hours. No results for input(s): "LIPASE", "AMYLASE" in the last 168 hours. No results for input(s): "AMMONIA" in the last 168 hours. Coagulation Profile: Recent Labs  Lab 03/27/22 1340  INR 1.3*   Cardiac Enzymes: No results for input(s): "CKTOTAL", "CKMB", "CKMBINDEX", "TROPONINI" in the last 168 hours. BNP (last 3 results) No results for input(s): "PROBNP" in the last 8760 hours. HbA1C: No results for input(s): "HGBA1C" in the last 72 hours. CBG: No results for input(s): "GLUCAP" in the last 168 hours. Lipid Profile: No results for input(s): "CHOL", "HDL", "LDLCALC", "TRIG", "CHOLHDL", "LDLDIRECT" in the last 72 hours. Thyroid Function Tests: No results for input(s): "TSH", "T4TOTAL", "FREET4", "T3FREE", "THYROIDAB" in the last 72 hours. Anemia Panel: No results for input(s): "VITAMINB12", "FOLATE", "FERRITIN", "TIBC", "IRON", "RETICCTPCT" in the last 72 hours. Urine analysis:    Component Value Date/Time   COLORURINE YELLOW (A) 03/27/2022 1801   APPEARANCEUR HAZY (A) 03/27/2022 1801   LABSPEC 1.013 03/27/2022 1801   PHURINE 5.0 03/27/2022 1801   GLUCOSEU NEGATIVE 03/27/2022 1801   HGBUR NEGATIVE 03/27/2022 1801   BILIRUBINUR NEGATIVE 03/27/2022 1801   KETONESUR NEGATIVE 03/27/2022 1801   PROTEINUR NEGATIVE 03/27/2022 1801    NITRITE NEGATIVE 03/27/2022 1801   LEUKOCYTESUR LARGE (A) 03/27/2022 1801    Radiological Exams on Admission: DG Chest 2 View  Result Date: 03/27/2022 CLINICAL DATA:  Shortness of breath and weakness EXAM: CHEST - 2 VIEW COMPARISON:  Chest x-ray 10/08/2019.  CT of the chest 10/08/2019. FINDINGS: Emphysematous changes are again seen. There is stable scarring in the lung apices. There is no new focal lung infiltrate or pleural effusion. There is no pneumothorax. The heart is enlarged, unchanged. No acute fractures are seen. There are chronic compression deformities of thoracic vertebral bodies similar to the prior study. IMPRESSION: 1. No acute cardiopulmonary process. 2. Stable cardiomegaly. 3. Emphysema. Electronically Signed   By: Ronney Asters M.D.   On: 03/27/2022 17:34     Data Reviewed: Relevant notes from primary care and specialist visits, past discharge summaries as available in EHR, including Care Everywhere. Prior diagnostic testing as pertinent to current admission diagnoses Updated medications and problem lists for reconciliation ED course, including vitals, labs, imaging, treatment and response to treatment Triage notes, nursing and pharmacy notes and ED provider's notes Notable results as noted in HPI   Assessment and Plan: * Acute blood loss anemia Wound right forearm/oozing from skin tear, now resolved Patient had skin tear a week prior that has been oozing, in spite of silver nitrate cauterization on 2/27.  Now doing better with Surgicel dressing Hemoglobin 8 down from 10.3 a week prior Monitor H&H and transfuse if under 7 Given recent central retinal vein occlusion and carotid endarterectomy will continue aspirin and Eliquis for now as bleeding has stopped Continue to monitor H&H  Abnormal urinalysis Possible UTI Received a dose of Rocephin in the ED Will hold off on further antibiotics pending culture  S/P right carotid endarterectomy 03/18/22 Patient had a 90%  right ICA stenosis. Continue aspirin and atorvastatin  Frailty Secondary to acute anemia, possible UTI, and chronic respiratory illnesses Supportive care  Elevated troponin Likely secondary to supply/demand mismatch related to acute anemia  Atrial fibrillation, chronic (HCC) Continue diltiazem, metoprolol and apixaban  Chronic respiratory failure with hypoxia (HCC) Continue supplemental oxygen  Chronic heart failure with preserved ejection fraction (HFpEF) (HCC) BNP slightly elevated and chest x-ray showing stable cardiomegaly Continue torsemide, metoprolol, diltiazem, Daily weights  COPD (chronic obstructive pulmonary disease) ([place]) Stable Continue Trelegy Ellipta DuoNebs as needed  H/O left central retinal artery occlusion 03/01/22 Continue Eliquis aspirin statin  PVD (peripheral vascular disease) ([place]) Chronic limb ischemia Patient was noted to have dusky appearance of hands in the ED but radial pulses were dopplerable Continue Eliquis and aspirin Dr. Lucky Cowboy aware and will consult to follow        DVT prophylaxis: Apixaban  Consults: Vascular, Dr. Lucky Cowboy  Advance Care Planning:   Code Status: Prior   Family Communication: none  Disposition Plan: Back to previous home environment  Severity of Illness: The appropriate patient status for this patient is OBSERVATION. Observation status is judged to be reasonable and necessary in order to provide the required intensity of service to ensure the patient's safety. The patient's presenting symptoms, physical exam findings, and initial radiographic and laboratory data in the context of their medical condition is felt to place them at decreased risk for further clinical deterioration. Furthermore, it is anticipated that the patient will be medically stable for discharge from the hospital within 2 midnights of admission.   Author: Athena Masse, MD 03/27/2022 8:55 PM  For on call review [email or web address].

## 2022-03-27 NOTE — Assessment & Plan Note (Signed)
Continue Eliquis aspirin statin

## 2022-03-27 NOTE — ED Triage Notes (Signed)
Pt to ED with daughter for prolonged bleeding from skin tear to R arm since 2/21. Was discharged from ICU on 2/21 after having some sort of procedure to R arm and has been bleeding steadily ever since, daughter states has been changing bandages, also saw wound care yesterday who cauterized bleeding area with silver nitrate, today pt saw wound care and they applied surgifoam. Pt takes Eliquis and ASA '81mg'$ , last doses yesterday. Concern is that pt has been weak, may be anemic, and may need to come off thinners but also has a fib.  Pt wears 4L oxygen at baseline. Arm is currently bandaged.

## 2022-03-28 DIAGNOSIS — Z9889 Other specified postprocedural states: Secondary | ICD-10-CM | POA: Diagnosis not present

## 2022-03-28 DIAGNOSIS — I5A Non-ischemic myocardial injury (non-traumatic): Secondary | ICD-10-CM | POA: Insufficient documentation

## 2022-03-28 DIAGNOSIS — I7389 Other specified peripheral vascular diseases: Secondary | ICD-10-CM | POA: Diagnosis present

## 2022-03-28 DIAGNOSIS — W19XXXA Unspecified fall, initial encounter: Secondary | ICD-10-CM | POA: Diagnosis present

## 2022-03-28 DIAGNOSIS — D649 Anemia, unspecified: Secondary | ICD-10-CM | POA: Diagnosis present

## 2022-03-28 DIAGNOSIS — I480 Paroxysmal atrial fibrillation: Secondary | ICD-10-CM | POA: Diagnosis present

## 2022-03-28 DIAGNOSIS — I482 Chronic atrial fibrillation, unspecified: Secondary | ICD-10-CM

## 2022-03-28 DIAGNOSIS — I130 Hypertensive heart and chronic kidney disease with heart failure and stage 1 through stage 4 chronic kidney disease, or unspecified chronic kidney disease: Secondary | ICD-10-CM | POA: Diagnosis present

## 2022-03-28 DIAGNOSIS — D500 Iron deficiency anemia secondary to blood loss (chronic): Secondary | ICD-10-CM | POA: Diagnosis present

## 2022-03-28 DIAGNOSIS — Z6820 Body mass index (BMI) 20.0-20.9, adult: Secondary | ICD-10-CM | POA: Diagnosis not present

## 2022-03-28 DIAGNOSIS — R8290 Unspecified abnormal findings in urine: Secondary | ICD-10-CM | POA: Diagnosis not present

## 2022-03-28 DIAGNOSIS — Z95828 Presence of other vascular implants and grafts: Secondary | ICD-10-CM

## 2022-03-28 DIAGNOSIS — R54 Age-related physical debility: Secondary | ICD-10-CM | POA: Diagnosis present

## 2022-03-28 DIAGNOSIS — Z66 Do not resuscitate: Secondary | ICD-10-CM | POA: Diagnosis present

## 2022-03-28 DIAGNOSIS — E876 Hypokalemia: Secondary | ICD-10-CM

## 2022-03-28 DIAGNOSIS — J9611 Chronic respiratory failure with hypoxia: Secondary | ICD-10-CM

## 2022-03-28 DIAGNOSIS — S51011A Laceration without foreign body of right elbow, initial encounter: Secondary | ICD-10-CM

## 2022-03-28 DIAGNOSIS — I5032 Chronic diastolic (congestive) heart failure: Secondary | ICD-10-CM

## 2022-03-28 DIAGNOSIS — S41101D Unspecified open wound of right upper arm, subsequent encounter: Secondary | ICD-10-CM | POA: Diagnosis not present

## 2022-03-28 DIAGNOSIS — Z87891 Personal history of nicotine dependence: Secondary | ICD-10-CM

## 2022-03-28 DIAGNOSIS — I739 Peripheral vascular disease, unspecified: Secondary | ICD-10-CM

## 2022-03-28 DIAGNOSIS — J449 Chronic obstructive pulmonary disease, unspecified: Secondary | ICD-10-CM | POA: Diagnosis present

## 2022-03-28 DIAGNOSIS — Z9981 Dependence on supplemental oxygen: Secondary | ICD-10-CM | POA: Diagnosis not present

## 2022-03-28 DIAGNOSIS — N1831 Chronic kidney disease, stage 3a: Secondary | ICD-10-CM | POA: Diagnosis present

## 2022-03-28 DIAGNOSIS — I4891 Unspecified atrial fibrillation: Secondary | ICD-10-CM

## 2022-03-28 DIAGNOSIS — E46 Unspecified protein-calorie malnutrition: Secondary | ICD-10-CM | POA: Diagnosis present

## 2022-03-28 DIAGNOSIS — D62 Acute posthemorrhagic anemia: Secondary | ICD-10-CM

## 2022-03-28 DIAGNOSIS — I2510 Atherosclerotic heart disease of native coronary artery without angina pectoris: Secondary | ICD-10-CM | POA: Diagnosis present

## 2022-03-28 DIAGNOSIS — Z85828 Personal history of other malignant neoplasm of skin: Secondary | ICD-10-CM | POA: Diagnosis not present

## 2022-03-28 DIAGNOSIS — Z7901 Long term (current) use of anticoagulants: Secondary | ICD-10-CM

## 2022-03-28 DIAGNOSIS — S41111A Laceration without foreign body of right upper arm, initial encounter: Secondary | ICD-10-CM | POA: Diagnosis present

## 2022-03-28 DIAGNOSIS — E785 Hyperlipidemia, unspecified: Secondary | ICD-10-CM | POA: Diagnosis present

## 2022-03-28 DIAGNOSIS — N3000 Acute cystitis without hematuria: Secondary | ICD-10-CM | POA: Diagnosis present

## 2022-03-28 DIAGNOSIS — Y92009 Unspecified place in unspecified non-institutional (private) residence as the place of occurrence of the external cause: Secondary | ICD-10-CM | POA: Diagnosis not present

## 2022-03-28 DIAGNOSIS — I272 Pulmonary hypertension, unspecified: Secondary | ICD-10-CM | POA: Diagnosis present

## 2022-03-28 LAB — HEMOGLOBIN AND HEMATOCRIT, BLOOD
HCT: 21.8 % — ABNORMAL LOW (ref 39.0–52.0)
HCT: 21.8 % — ABNORMAL LOW (ref 39.0–52.0)
Hemoglobin: 7.1 g/dL — ABNORMAL LOW (ref 13.0–17.0)
Hemoglobin: 6.9 g/dL — ABNORMAL LOW (ref 13.0–17.0)

## 2022-03-28 LAB — PREPARE RBC (CROSSMATCH)

## 2022-03-28 MED ORDER — FUROSEMIDE 10 MG/ML IJ SOLN
40.0000 mg | Freq: Once | INTRAMUSCULAR | Status: AC
  Administered 2022-03-28: 40 mg via INTRAVENOUS
  Filled 2022-03-28: qty 4

## 2022-03-28 MED ORDER — SODIUM CHLORIDE 0.9 % IV SOLN
1.0000 g | INTRAVENOUS | Status: DC
  Administered 2022-03-28: 1 g via INTRAVENOUS
  Filled 2022-03-28: qty 10
  Filled 2022-03-28: qty 1

## 2022-03-28 MED ORDER — POTASSIUM CHLORIDE CRYS ER 20 MEQ PO TBCR
40.0000 meq | EXTENDED_RELEASE_TABLET | Freq: Once | ORAL | Status: AC
  Administered 2022-03-28: 40 meq via ORAL
  Filled 2022-03-28: qty 2

## 2022-03-28 MED ORDER — MAGNESIUM OXIDE -MG SUPPLEMENT 400 (240 MG) MG PO TABS
400.0000 mg | ORAL_TABLET | Freq: Every day | ORAL | Status: DC
  Administered 2022-03-28 – 2022-03-29 (×2): 400 mg via ORAL
  Filled 2022-03-28 (×2): qty 1

## 2022-03-28 MED ORDER — SODIUM CHLORIDE 0.9% IV SOLUTION: Freq: Once | INTRAVENOUS | Status: AC

## 2022-03-28 MED ORDER — SODIUM CHLORIDE 0.9 % IV SOLN
300.0000 mg | Freq: Once | INTRAVENOUS | Status: AC
  Administered 2022-03-28: 300 mg via INTRAVENOUS
  Filled 2022-03-28: qty 15
  Filled 2022-03-28: qty 300

## 2022-03-28 NOTE — Hospital Course (Addendum)
89 y.o. male with medical history significant for HFpEF with last EF of 60-65 % 02/28/2022, COPD and severe pulmonary hypertension with chronic hypoxic respiratory failure on 3 L, paroxysmal A-fib on Eliquis, coronary artery disease, , hypertension, hyperlipidemia admitted on 2/1 to 2/2 with acute left vision loss secondary to central retinal artery occlusion with workup showing a 90% right ICA stenosis, for which he underwent right carotid angioplasty with stent placement on 03/19/2022 and started on aspirin in addition to his Eliquis, who presents to the ED with persistent oozing from an wound on his arm that he sustained while in the hospital.  Following his discharge she had intermittent oozing and he went to the wound clinic the day prior and it was cauterized with silver nitrate however the oozing continued and a Surgicel dressing was placed today which appeared to stop the bleeding.  He was brought to the emergency room because in addition to the earlier oozing, patient has been increasingly weak and with poor appetite and has had some breathing difficulty beyond his baseline.  He has not had a cough, chest pain, fever or chills and denies abdominal pain, vomiting, diarrhea or dysuria.  He continues to take his Eliquis and baby aspirin. ED course and data review: On arrival afebrile, BP 109/62 with pulse 83, respirations 22 and O2 sat 92% on oxygen at home flow rate.  Labs notable for troponin 28, BNP 956.  Hemoglobin 8 down from 10 a week prior WBC 12,100.  Potassium 2.9,Creatinine 1.29 which is above baseline.  Urinalysis showing large leukocyte esterase.  INR 1.3 EKG, personally viewed and interpreted shows sinus tachycardia at 106 with RBBB chest x-ray shows stable cardiomegaly otherwise nonacute. Patient was treated with a 1 L NS bolus and started on ceftriaxone for possible UTI The ED provider noted that patient had a dusky appearance of bilateral upper extremities on physical exam.  Radial pulses were  both dopplerable.  He discussed this with vascular surgeon, Dr. Lucky Cowboy who will see patient if admitted. Hospitalist consulted for admission.  2/29.  Hemoglobin dipped down to 6.9.  Will give 1 unit of packed red blood cells.  Benefits and risks explained.  Will also give IV iron.  He had on urine culture with squamous epithelial cells cells 0-5 and white blood cell count greater than 50. 3/1.  Hemoglobin up to 7.9 after transfusion.  No bleeding from arm today.  Did well with physical therapy.  This morning I did hear a wheeze but I given nebulizer treatment and steroid and improved.

## 2022-03-28 NOTE — Assessment & Plan Note (Addendum)
Will need follow-up at the wound care center.

## 2022-03-28 NOTE — TOC Initial Note (Signed)
Transition of Care [facility]) - Initial/Assessment Note    Patient Details  Name: [name] MRN: [id number] Date of Birth: [birth year]  [facility] CM/SW Contact:    Beverly Sessions, RN Phone Number: 03/28/2022, 2:02 PM  Clinical Narrative:                  Admitted [id number] Admitted from: with daughter kelly PCP: Edwina Barth Current home health/prior home health/DME: RW, cane, Home O2 at 4L  Daughter states that she will transport at discharge and has his portable concentrator in the car. PT eval pending.  Patient states that he is agreeable to home health if indicated. Daughter states that she does not have a preference of agency.  Tommi Rumps with [facility] is able to accept for home health if therapy recommended   Expected Discharge Plan: [place] Barriers to Discharge: Continued Medical Work up   Patient Goals and CMS Choice Patient states their goals for this hospitalization and ongoing recovery are:: arm to feel better CMS Medicare.gov Compare Post Acute Care list provided to:: Patient Choice offered to / list presented to : Adult Children      Expected Discharge Plan and Services     Post Acute Care Choice: Home Health Living arrangements for the past 2 months: Single Family Home                           HH Arranged: PT, OT HH Agency: [facility] Date [facility] Agency Contacted: 03/28/22   Representative spoke with at [place]: cory  Prior Living Arrangements/Services Living arrangements for the past 2 months: [place] Lives with:: Adult Children Patient language and need for interpreter reviewed:: Yes Do you feel safe going back to the place where you live?: Yes          Current home services: DME Criminal Activity/Legal Involvement Pertinent to Current Situation/Hospitalization: No - Comment as needed  Activities of Daily Living Home Assistive Devices/Equipment: Cane (specify quad or straight) ADL Screening  (condition at time of admission) [name] cognitive ability adequate to safely complete daily activities?: Yes Is the patient deaf or have difficulty hearing?: No Does the patient have difficulty seeing, even when wearing glasses/contacts?: Yes Does the patient have difficulty concentrating, remembering, or making decisions?: No Patient able to express need for assistance with ADLs?: Yes Does the patient have difficulty dressing or bathing?: No Independently performs ADLs?: Yes (appropriate for developmental age) Does the patient have difficulty walking or climbing stairs?: Yes Weakness of Legs: Both Weakness of Arms/Hands: None  Permission Sought/Granted                  Emotional Assessment       Orientation: : Oriented to Self, Oriented to Place, Oriented to Situation, Oriented to  Time      Admission diagnosis:  Blood loss anemia [D50.0] Acute blood loss anemia [D62] Acute cystitis without hematuria [N30.00] Generalized weakness [R53.1] Patient Active Problem List   Diagnosis Date Noted   Myocardial injury 03/28/2022   Hypokalemia 03/28/2022   Acute blood loss anemia 03/27/2022   S/P right carotid endarterectomy 03/18/22 03/27/2022   H/O left central retinal artery occlusion 03/01/22 03/27/2022   Arm wound, right, subsequent encounter 03/27/2022   Frailty 03/27/2022   Abnormal urinalysis 03/27/2022   Chronic respiratory failure with hypoxia ([place]) 03/27/2022   Carotid stenosis, right 03/18/2022   Carotid stenosis 03/08/2022   PVD (  peripheral vascular disease) ([employer]) 03/08/2022   Acute loss of vision 02/28/2022   Venous ulcer of ankle, right ([place]) 09/04/2020   Protein-calorie malnutrition, severe 08/10/2020   Atherosclerosis of artery of extremity with ulceration ([place]) 08/09/2020   Atherosclerosis of native arteries of the extremities with ulceration ([place]) 06/30/2020   Acute on chronic respiratory failure with hypoxia (HCC) 10/08/2019   Chronic diastolic CHF  (congestive heart failure) ([place]) 10/08/2019   COPD (chronic obstructive pulmonary disease) (HCC)    Aspiration pneumonia (HCC)    Sepsis (HCC)    Closed fracture of sternum    Compression fracture of thoracic vertebra (HCC)    Nonthrombocytopenic purpura ([place]) 02/08/2019   Acute on chronic diastolic CHF (congestive heart failure) ([place]) 01/27/2019   Acute respiratory failure with hypoxia ([place]) 01/24/2019   Pneumonia due to COVID-19 virus 01/24/2019   Atrial fibrillation, chronic ([place]) 01/24/2019   Atrial fibrillation with RVR ([place]) 01/04/2019   Constipation 01/04/2019   HLD (hyperlipidemia) 01/04/2019   Hypertension    Coronary artery disease    COVID-19    PCP:  Baxter Hire, MD Pharmacy:   [place], [place] - [address] [place] [address] Phone: [phone] Fax: [phone]  [id number] [place] [id number]: [phone]: [phone]    Social Determinants of Health (SDOH) Social History: SDOH Screenings   Food Insecurity: No Food Insecurity (03/27/2022)  Housing: Low Risk  (03/27/2022)  Transportation Needs: No Transportation Needs (03/27/2022)  Utilities: Not At Risk (03/27/2022)  Tobacco Use: Medium Risk (03/27/2022)   SDOH Interventions:     Readmission Risk Interventions    10/10/2019    4:22 PM  Readmission Risk Prevention Plan  Transportation Screening Complete  PCP or Specialist Appt within 3-5 Days Complete  HRI or [place]  Social Work Consult for RVincentPlanning/Counseling Complete  Palliative Care Screening Complete  Medication Review (Press photographer Complete

## 2022-03-28 NOTE — Assessment & Plan Note (Addendum)
Torsemide daily.

## 2022-03-28 NOTE — Consult Note (Signed)
[facility] Nurse Consult Note: Reason for Consult:COnsulted due to trauma to right arm.  Fall at home and unable to stop oozing over a period of one week.  Patient seen at wound care center and cauterized with silver nitrate.   More oozing noted and Surgicell dressing applied in the ED.  This is ordered to be nonremovable at this time and no further oozing is noted.  Of note,  upper extremities were noted to be dusky with radial pulses noted on doppler and vascular consult is pending.  [facility] team will not remove surgicell dressing as coagulation has hopefully been established and perfusion workup is pending.  Wound type: trauma, on anticoagulation Pressure Injury POA:NA Vascular team to assess and determine plan of care for upper extremities.  Will not follow at this time.  Please re-consult if needed.  Estrellita Ludwig MSN, RN, FNP-BC CWON Wound, Ostomy, Continence Nurse [facility] [phone] Pager [phone]

## 2022-03-28 NOTE — IPAL (Addendum)
  Interdisciplinary Goals of Care Family Meeting   Date carried out: 03/28/2022  Location of the meeting: Bedside  Member's involved: Physician  Durable Power of Attorney or acting medical decision maker: patient    Discussion: We discussed goals of care for [employer] .   I have reviewed medical records including EPIC notes, labs and imaging, assessed the patient and then met with patient to discuss major active diagnoses, plan of care, natural trajectory, prognosis, GOC, EOL wishes, disposition and options including Full code/DNI/DNR and the concept of comfort care if DNR is elected. Questions and concerns were addressed. They are in agreement to continue current plan of care . Election for DNR if no pulse. IF a pulse, Intubate but no chest compressions   Code status:   Code Status: DNR   Disposition: Continue current acute care  Time spent for the meeting: [place], MD  03/28/2022, 2:02 AM

## 2022-03-28 NOTE — Consult Note (Signed)
Hospital Consult    Reason for Consult:  Dusky Bilateral Upper extremities.  Requesting Physician:  Dr. Judd Gaudier MD MRN #:  [id number]  History of Present Illness: This is a 89 y.o. male h medical history significant for HFpEF with last EF of 60-65 % 02/28/2022, COPD and severe pulmonary hypertension with chronic hypoxic respiratory failure on 3 L, paroxysmal A-fib on Eliquis, coronary artery disease, , hypertension, hyperlipidemia admitted on 2/1 to 2/2 with acute left vision loss secondary to central retinal artery occlusion with workup showing a 90% right ICA stenosis, for which he underwent right carotid angioplasty with stent placement on 03/19/2022 and started on aspirin in addition to his Eliquis, who presents to the ED with persistent oozing from an wound on his arm that he sustained while in the hospital.  He was brought to the emergency room because in addition to the earlier oozing, patient has been increasingly weak and with poor appetite and has had some breathing difficulty beyond his baseline.  He has not had a cough, chest pain, fever or chills and denies abdominal pain, vomiting, diarrhea or dysuria.  He continues to take his Eliquis and baby aspirin.   Past Medical History:  Diagnosis Date   (HFpEF) heart failure with preserved ejection fraction (HCC)    Aortic atherosclerosis ([place])    Basal cell carcinoma 09/13/2021   Left cheek. EDC   Cardiac murmur    Grade I/VI medium pitched mid systolic blowing at lower LSB   COPD (chronic obstructive pulmonary disease) (HCC)    Coronary artery disease    Dependence on supplemental oxygen    HLD (hyperlipidemia)    Hypertension    Paroxysmal atrial fibrillation (HCC)    PVD (peripheral vascular disease) (HCC)    Skin cancer    nose and ear - tx with Mohs   Thoracic aortic ectasia (HCC)    a.) measured 3.6 x 3.3 cm on 10/08/2019    Past Surgical History:  Procedure Laterality Date   CAROTID PTA/STENT INTERVENTION Right  03/18/2022   Procedure: CAROTID PTA/STENT INTERVENTION;  Surgeon: Algernon Huxley, MD;  Location: [facility];  Service: Cardiovascular;  Laterality: Right;   COLONOSCOPY  2007   ENDARTERECTOMY FEMORAL Right 08/09/2020   Procedure: RIGHT COMMON AND PROFUNDA FEMORAL ENDARTERECTOMY;  Surgeon: Algernon Huxley, MD;  Location: [facility];  Service: Vascular;  Laterality: Right;   EYE SURGERY Bilateral 2005   LOWER EXTREMITY ANGIOGRAPHY Right 07/05/2020   Procedure: LOWER EXTREMITY ANGIOGRAPHY;  Surgeon: Algernon Huxley, MD;  Location: [facility];  Service: Cardiovascular;  Laterality: Right;   Renal Bypass Surgery Bilateral 2000    Allergies  Allergen Reactions   Bee Venom Anaphylaxis    Prior to Admission medications   Medication Sig Start Date End Date Taking? Authorizing Provider  apixaban (ELIQUIS) 2.5 MG TABS tablet Take 2.5 mg by mouth 2 (two) times daily.   Yes [provider]  ascorbic acid (VITAMIN C) 250 MG tablet Take 1 tablet (250 mg total) by mouth 2 (two) times daily. 08/11/20  Yes Stegmayer, Janalyn Harder, PA-C  aspirin EC 81 MG EC tablet Take 1 tablet (81 mg total) by mouth daily at 6 (six) AM. Swallow whole. 08/11/20  Yes Stegmayer, Janalyn Harder, PA-C  atorvastatin (LIPITOR) 40 MG tablet Take 1 tablet (40 mg total) by mouth daily. 03/01/22 05/30/22 Yes Val Riles, MD  Calcium Carb-Cholecalciferol (CALCIUM 600/VITAMIN D PO) Take by mouth.   Yes [provider]  Geronimo Boot  XT 300 MG 24 hr capsule Take 300 mg by mouth daily. 09/16/19  Yes [provider]  Fluticasone-Umeclidin-Vilant (TRELEGY ELLIPTA) 100-62.5-25 MCG/INH AEPB Inhale into the lungs.   Yes [provider]  metoprolol tartrate (LOPRESSOR) 50 MG tablet Take 50 mg by mouth 2 (two) times daily.   Yes [provider]  Multiple Vitamin (MULTIVITAMIN WITH MINERALS) TABS tablet Take 1 tablet by mouth daily. 08/12/20  Yes Stegmayer, Joelene Millin A, PA-C  potassium chloride (KLOR-CON) 10 MEQ  tablet Take 2 tablets (20 mEq total) by mouth daily. Patient taking differently: Take 10-20 mEq by mouth daily. 01/14/20  Yes Alisa Graff, FNP  torsemide (DEMADEX) 20 MG tablet Take '40mg'$  on M, W, F and '20mg'$  on Tu, Thur,Sat & Sunday Patient taking differently: Take 20-40 mg by mouth daily. Take '40mg'$  on M, F and '20mg'$  on Tu, Thur, Sat & Sunday 01/11/22  Yes Hackney, [name] A, FNP  Wheat Dextrin (BENEFIBER PO) Take by mouth. 2 teaspoons twice a day   Yes [provider]  acetaminophen (TYLENOL) 500 MG tablet Take 500 mg by mouth every 6 (six) hours as needed for fever or pain.    [provider]  alendronate (FOSAMAX) 70 MG tablet Take 70 mg by mouth once a week. Sunday morning with full glass of water and sit up do not lay down 06/07/20   [provider]  ciclopirox (LOPROX) 0.77 % cream Apply topically 2 (two) times daily. Apply to feet BID x 1 month. Then QW thereafter. Patient not taking: Reported on 03/18/2022 07/25/21   Laurence Ferrari, [place], MD  ciclopirox (LOPROX) 0.77 % SUSP Apply once daily to affected fingernails Patient not taking: Reported on 03/18/2022 08/01/21   Laurence Ferrari, [place], MD  mupirocin ointment (BACTROBAN) 2 % Apply 1 Application topically daily. Apply to wounds once daily until healed. Patient not taking: Reported on 03/18/2022 07/25/21   Laurence Ferrari, [place], MD  Vitamin D, Ergocalciferol, (DRISDOL) 1.25 MG (50000 UNIT) CAPS capsule Take 1 capsule (50,000 Units total) by mouth every 7 (seven) days. 10/15/19   Wouk, Ailene Rud, MD    Social History   Socioeconomic History   Marital status: Married    Spouse name: Not on file   Number of children: Not on file   Years of education: Not on file   Highest education level: Not on file  Occupational History   Not on file  Tobacco Use   Smoking status: Former    Packs/day: 1.00    Years: 30.00    Total pack years: 30.00    Types: Cigarettes    Quit date: 106    Years since quitting: 27.1   Smokeless tobacco:  Never  Vaping Use   Vaping Use: Never used  Substance and Sexual Activity   Alcohol use: Never   Drug use: Never   Sexual activity: Not Currently  Other Topics Concern   Not on file  Social History Narrative   Lives at home alone with help from daughter.   Social Determinants of Health   Financial Resource Strain: Not on file  Food Insecurity: No Food Insecurity (03/27/2022)   Hunger Vital Sign    Worried About Running Out of Food in the Last Year: Never true    Ran Out of Food in the Last Year: Never true  Transportation Needs: No Transportation Needs (03/27/2022)   PRAPARE - Hydrologist (Medical): No    Lack of Transportation (Non-Medical): No  Physical Activity: Not on  file  Stress: Not on file  Social Connections: Not on file  Intimate Partner Violence: Not At Risk (03/27/2022)   Humiliation, Afraid, Rape, and Kick questionnaire    Fear of Current or Ex-Partner: No    Emotionally Abused: No    Physically Abused: No    Sexually Abused: No     History reviewed. No pertinent family history.  ROS: Otherwise negative unless mentioned in HPI  Physical Examination  Vitals:   03/27/22 2313 03/28/22 0428  BP: (!) 130/57 (!) 121/51  Pulse: (!) 108 91  Resp: 20 18  Temp: 98.3 F (36.8 C) 98.5 F (36.9 C)  SpO2:  100%   Body mass index is 18.03 kg/m.  General:  WDWN in NAD Gait: Not observed HENT: WNL, normocephalic Pulmonary: normal non-labored breathing, without Rales, rhonchi,  wheezing Cardiac: irregular HX of AFIB, without  Murmurs, rubs or gallops; without carotid bruits Abdomen: Positive bowel sounds, soft, NT/ND, no masses Skin: without rashes Vascular Exam/Pulses: Bilateral Palpable Radial Pulses. Extremities: without ischemic changes, without Gangrene , without cellulitis; with open wounds;  Musculoskeletal: no muscle wasting or atrophy  Neurologic: A&O X 3;  No focal weakness or paresthesias are detected; speech is  fluent/normal Psychiatric:  The pt has Normal affect. Lymph:  Unremarkable  CBC    Component Value Date/Time   WBC 12.1 (H) 03/27/2022 1340   RBC 2.56 (L) 03/27/2022 1340   HGB 7.1 (L) 03/28/2022 0255   HGB 11.2 (L) 01/31/2014 0506   HCT 21.8 (L) 03/28/2022 0255   HCT 34.0 (L) 01/31/2014 0506   PLT 320 03/27/2022 1340   PLT 283 01/31/2014 0506   MCV 96.1 03/27/2022 1340   MCV 95 01/31/2014 0506   MCH 31.3 03/27/2022 1340   MCHC 32.5 03/27/2022 1340   RDW 14.4 03/27/2022 1340   RDW 13.3 01/31/2014 0506   LYMPHSABS 2.5 03/27/2022 1340   LYMPHSABS 1.6 01/31/2014 0506   MONOABS 1.6 (H) 03/27/2022 1340   MONOABS 1.5 (H) 01/31/2014 0506   EOSABS 0.2 03/27/2022 1340   EOSABS 0.0 01/31/2014 0506   BASOSABS 0.1 03/27/2022 1340   BASOSABS 0.0 01/31/2014 0506    BMET    Component Value Date/Time   NA 135 03/27/2022 1339   NA 140 02/02/2014 0356   K 2.9 (L) 03/27/2022 1339   K 3.7 02/02/2014 0356   CL 93 (L) 03/27/2022 1339   CL 104 02/02/2014 0356   CO2 32 03/27/2022 1339   CO2 29 02/02/2014 0356   GLUCOSE 114 (H) 03/27/2022 1339   GLUCOSE 160 (H) 02/02/2014 0356   BUN 39 (H) 03/27/2022 1339   BUN 33 (H) 02/02/2014 0356   CREATININE 1.29 (H) 03/27/2022 1339   CREATININE 0.86 02/02/2014 0356   CALCIUM 9.3 03/27/2022 1339   CALCIUM 8.6 10/10/2019 0305   GFRNONAA 52 (L) 03/27/2022 1339   GFRNONAA >60 02/02/2014 0356   GFRNONAA >60 06/25/2011 1325   GFRAA >60 10/15/2019 0634   GFRAA >60 02/02/2014 0356   GFRAA >60 06/25/2011 1325    COAGS: Lab Results  Component Value Date   INR 1.3 (H) 03/27/2022   INR 1.5 (H) 02/28/2022   INR 1.4 (H) 10/15/2019     Non-Invasive Vascular Imaging:   N/A  Statin:  Yes.   Beta Blocker:  No. Aspirin:  Yes.   ACEI:  No. ARB:  No. CCB use:  Yes Other antiplatelets/anticoagulants:  Yes.   Eliquis 2.5 mg BID   ASSESSMENT/PLAN: This is a 89 y.o. male who  presents to [facility] emergency room with persistent oozing from the wound on  his right arm that he sustained while he was in the hospital previously on 03/19/2022.  Patient continues to need to be on aspirin 81 mg daily and Eliquis 2.5 mg twice daily for his atrial fibrillation.  For he will continue to bruise extensively which he already has on his bilateral upper extremities.  Bilateral radial pulses are palpable.  Bilateral upper extremities are nonedematous.  I did discussion with the hospitalist covering Mr. Brand this morning.  Hemoglobin was low at 7.1 and will be receiving a unit of blood.  The skin tear to his right elbow which had continued to ooze had treatment at the wound clinic with some Surgicel applied to it.  Surgicel come stay in place for 7 days and the wound care clinic will address as outpatient.  Patient noted to have a BNP of 976 this morning.  Primary team will diuresis and evaluate appropriately.  Overall plan is patient to be transfused today and diuresed.  He will stay 1 overnight.  Then be discharged back home tomorrow.  Vascular surgery is in agreement with this plan.   -Plan was discussed with Dr. [name] MD and he agrees with the plan.   Drema Pry [facility] 03/28/2022 7:27 AM

## 2022-03-28 NOTE — Assessment & Plan Note (Addendum)
Elevated troponin secondary to severe anemia.

## 2022-03-28 NOTE — Progress Notes (Signed)
Progress Note   Patient: [name] [id number] DOB: 23-Sep-1928 DOA: 03/27/2022     0 DOS: the patient was seen and examined on 03/28/2022   Brief hospital course: 89 y.o. male with medical history significant for HFpEF with last EF of 60-65 % 02/28/2022, COPD and severe pulmonary hypertension with chronic hypoxic respiratory failure on 3 L, paroxysmal A-fib on Eliquis, coronary artery disease, , hypertension, hyperlipidemia admitted on 2/1 to 2/2 with acute left vision loss secondary to central retinal artery occlusion with workup showing a 90% right ICA stenosis, for which he underwent right carotid angioplasty with stent placement on 03/19/2022 and started on aspirin in addition to his Eliquis, who presents to the ED with persistent oozing from an wound on his arm that he sustained while in the hospital.  Following his discharge she had intermittent oozing and he went to the wound clinic the day prior and it was cauterized with silver nitrate however the oozing continued and a Surgicel dressing was placed today which appeared to stop the bleeding.  He was brought to the emergency room because in addition to the earlier oozing, patient has been increasingly weak and with poor appetite and has had some breathing difficulty beyond his baseline.  He has not had a cough, chest pain, fever or chills and denies abdominal pain, vomiting, diarrhea or dysuria.  He continues to take his Eliquis and baby aspirin. ED course and data review: On arrival afebrile, BP 109/62 with pulse 83, respirations 22 and O2 sat 92% on oxygen at home flow rate.  Labs notable for troponin 28, BNP 956.  Hemoglobin 8 down from 10 a week prior WBC 12,100.  Potassium 2.9,Creatinine 1.29 which is above baseline.  Urinalysis showing large leukocyte esterase.  INR 1.3 EKG, personally viewed and interpreted shows sinus tachycardia at 106 with RBBB chest x-ray shows stable cardiomegaly otherwise nonacute. Patient was treated with a 1 L NS  bolus and started on ceftriaxone for possible UTI The ED provider noted that patient had a dusky appearance of bilateral upper extremities on physical exam.  Radial pulses were both dopplerable.  He discussed this with vascular surgeon, Dr. Lucky Cowboy who will see patient if admitted. Hospitalist consulted for admission.  2/29.  Hemoglobin dipped down to 6.9.  Will give 1 unit of packed red blood cells.  Benefits and risks explained.  Will also give IV iron.  He had on urine culture with squamous epithelial cells cells 0-5 and white blood cell count greater than 50.  Assessment and Plan: * Acute blood loss anemia Wound right forearm/oozing from skin tear. With hemoglobin dropping down to 6.9 we will give a unit of packed red blood cells and also IV iron today.  Reassess hemoglobin tomorrow.  As per vascular surgery needs to be on Eliquis and aspirin.  Arm wound, right, subsequent encounter Will need follow-up at the wound care center.  Appreciate wound care nurse evaluation today.  Abnormal urinalysis Add on a urine culture.  Continue Rocephin.  White blood cell count greater than 15 and squamous epithelial cells 0-5.  Hypokalemia Replace potassium twice today and also oral magnesium.  Recheck electrolytes tomorrow morning.  Chronic diastolic CHF (congestive heart failure) (HCC) Torsemide daily.  Will give dose of Lasix after transfusion.  Frailty Physical therapy evaluation  S/P right carotid endarterectomy 03/18/22 Patient had a 90% right ICA stenosis. Continue aspirin, Eliquis and atorvastatin  Atrial fibrillation, chronic (HCC) Continue diltiazem, metoprolol and apixaban  Chronic respiratory failure with hypoxia ([place])  Continue supplemental oxygen  COPD (chronic obstructive pulmonary disease) (HCC) Stable Continue Trelegy Ellipta DuoNebs as needed  Myocardial injury Elevated troponin second [place] to severe anemia.  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  Continue anticoagulation.        Subjective: Patient seen this morning and feels okay.  Since his vascular procedure he has been having some oozing on his right arm.  Has been followed up at the wound care center which cauterized his arm.  They also put some Surgicel on there.  Hemoglobin continues to drift down and will be transfused today for hemoglobin of 6.9.  Physical Exam: Vitals:   03/27/22 2313 03/27/22 2318 03/28/22 0428 03/28/22 0737  BP: (!) 130/57  (!) 121/51 (!) 121/55  Pulse: (!) 108  91 99  Resp: '20  18 18  '$ Temp: 98.3 F (36.8 C)  98.5 F (36.9 C) 98.3 F (36.8 C)  TempSrc:    Oral  SpO2:   100% 100%  Height:  6' (1.829 m)     Physical Exam HENT:     Head: Normocephalic.     Mouth/Throat:     Pharynx: No oropharyngeal exudate.  Eyes:     General: Lids are normal.     Comments: Pale conjunctiva  Cardiovascular:     Rate and Rhythm: Normal rate and regular rhythm.     Heart sounds: Normal heart sounds, S1 normal and S2 normal.  Pulmonary:     Breath sounds: No decreased breath sounds, wheezing, rhonchi or rales.  Abdominal:     Palpations: Abdomen is soft.     Tenderness: There is no abdominal tenderness.  Musculoskeletal:     Right lower leg: No swelling.     Left lower leg: No swelling.  Skin:    General: Skin is warm.     Comments: Right arm area of dried blood underneath the Surgicel.  Neurological:     Mental Status: He is alert and oriented to person, place, and time.     Data Reviewed: Last hemoglobin 6.9, last potassium 2.9, magnesium 1.9 Family Communication: Spoke with patient's daughter on the phone  Disposition: Status is: Observation Transfuse 1 unit of packed red blood cells today.  Likely home tomorrow if no further bleeding and electrolytes okay.  Planned Discharge Destination: Home    Time spent: 28  minutes Case discussed with vascular surgery Author: Loletha Grayer, MD 03/28/2022 1:47 PM  For on call review [email or web address].

## 2022-03-28 NOTE — Assessment & Plan Note (Addendum)
Replaced aggressively yesterday.  Continue potassium supplementation and magnesium supplementation at home.

## 2022-03-28 NOTE — Evaluation (Signed)
Physical Therapy Evaluation Patient Details Name: [name] MRN: [id number] DOB: Nov 02, 1928 Today's Date: 03/28/2022  History of Present Illness  89 y.o. male with medical history significant for HFpEF with last EF of 60-65 % 02/28/2022, COPD and severe pulmonary hypertension with chronic hypoxic respiratory failure on 3 L, paroxysmal A-fib on Eliquis, coronary artery disease, , hypertension, hyperlipidemia admitted on 2/1 to 2/2 with acute left vision loss secondary to central retinal artery occlusion with workup showing a 90% right ICA stenosis, for which he underwent right carotid angioplasty with stent placement on 03/19/2022 and started on aspirin in addition to his Eliquis, who presents to the ED with persistent oozing from an wound on his arm.  admitted with anemia  Clinical Impression  Pt feeling weaker than his normal but eager to try and walk and see how he does.  Ultimately he was able to ambulate into the hallway on 3L with slow but safe and appropriate gait.  Initially using walker 2/2 concerns about being weak but quickly transitioning to [facility] without excessive UE use.  Good overall effort and tolerance but not at baseline.  Recommending HHPT to address functional limitations and work back to PLOF.      Recommendations for follow up therapy are one component of a multi-disciplinary discharge planning process, led by the attending physician.  Recommendations may be updated based on patient status, additional functional criteria and insurance authorization.  Follow Up Recommendations Home health PT      Assistance Recommended at Discharge Intermittent Supervision/Assistance  Patient can return home with the following  A little help with bathing/dressing/bathroom;Assistance with cooking/housework;A little help with walking and/or transfers;Assist for transportation;Help with stairs or ramp for entrance    Equipment Recommendations None recommended by PT  Recommendations for Other  Services       Functional Status Assessment Patient has had a recent decline in their functional status and demonstrates the ability to make significant improvements in function in a reasonable and predictable amount of time.     Precautions / Restrictions Precautions Precautions: Fall Restrictions Weight Bearing Restrictions: No      Mobility  Bed Mobility Overal bed mobility: Needs Assistance Bed Mobility: Supine to Sit, Sit to Supine     Supine to sit: Min guard Sit to supine: Min guard   General bed mobility comments: Pt with slow effort getting to/from EOB, but did not need direct assist to do so    Transfers Overall transfer level: Modified independent Equipment used: Rolling walker (2 wheels)               General transfer comment: Pt needed only light cuing for set up and hand placement, able to rise w/o direct assist.    Ambulation/Gait Ambulation/Gait assistance: Min guard Gait Distance (Feet): 70 Feet Assistive device: Rolling walker (2 wheels), Straight cane         General Gait Details: First 20 ft with FWW, pt with minimal UE reliance, slow gait but no LOBs and good confidence, transitioned to [facility] and did well.  Pt again did not show excessive UE reliance, endorsed some fatigue but did not have any DOE (on 3L during ambulation) or LOBs,.  Stairs            Wheelchair Mobility    Modified Rankin (Stroke Patients Only)       Balance Overall balance assessment: Needs assistance Sitting-balance support: No upper extremity supported Sitting balance-Leahy Scale: Good     Standing balance support: Single extremity supported  Standing balance-Leahy Scale: Good Standing balance comment: Pt able to ambulate with only light reliance on [facility]                             Pertinent Vitals/Pain Pain Assessment Pain Assessment: No/denies pain    Home Living Family/patient expects to be discharged to:: Private residence Living  Arrangements: Children Available Help at Discharge: Family;Available 24 hours/day Type of Home: House Home Access: Ramped entrance       Home Layout: One level Home Equipment: Conservation officer, nature (2 wheels);BSC/3in1;Cane - single point Additional Comments: Lives with dtr with 24/7 supervision available    Prior Function Prior Level of Function : Independent/Modified Independent             Mobility Comments: rarely out of the home apart from MD appointments, but mobile in the home, with and w/o SPC use as needed, no fall history ADLs Comments: Ind with ADLs, sponge bathes, daughter does meals, home management     Hand Dominance        Extremity/Trunk Assessment   Upper Extremity Assessment Upper Extremity Assessment: Overall WFL for tasks assessed;Generalized weakness (age appropriate limitations)    Lower Extremity Assessment Lower Extremity Assessment: Overall WFL for tasks assessed (age appropriate limitations)       Communication   Communication: No difficulties  Cognition Arousal/Alertness: Awake/alert Behavior During Therapy: WFL for tasks assessed/performed Overall Cognitive Status: Within Functional Limits for tasks assessed                                          General Comments General comments (skin integrity, edema, etc.): Pt slower and weaker than his baseline, but ultimately did better than expected given how weak, fatigued and anemic he has been.    Exercises     Assessment/Plan    PT Assessment Patient needs continued PT services  PT Problem List Decreased strength;Decreased range of motion;Decreased activity tolerance;Decreased balance;Decreased mobility;Decreased knowledge of use of DME;Decreased safety awareness;Cardiopulmonary status limiting activity       PT Treatment Interventions DME instruction;Stair training;Gait training;Functional mobility training;Therapeutic activities;Therapeutic exercise;Balance  training;Patient/family education    PT Goals (Current goals can be found in the Care Plan section)  Acute Rehab PT Goals Patient Stated Goal: go home tomorrow PT Goal Formulation: With patient Time For Goal Achievement: 04/10/22 Potential to Achieve Goals: Good    Frequency Min 2X/week     Co-evaluation               AM-PAC PT "6 Clicks" Mobility  Outcome Measure Help needed turning from your back to your side while in a flat bed without using bedrails?: None Help needed moving from lying on your back to sitting on the side of a flat bed without using bedrails?: None Help needed moving to and from a bed to a chair (including a wheelchair)?: A Little Help needed standing up from a chair using your arms (e.g., wheelchair or bedside chair)?: A Little Help needed to walk in hospital room?: A Little Help needed climbing 3-5 steps with a railing? : A Little 6 Click Score: 20    End of Session Equipment Utilized During Treatment: Gait belt Activity Tolerance: Patient tolerated treatment well Patient left: with call bell/phone within reach;with bed alarm set;with family/visitor present Nurse Communication: Mobility status PT Visit Diagnosis: Muscle weakness (generalized) (M62.81);Difficulty  in walking, not elsewhere classified (R26.2)    Time: [id number] PT Time Calculation (min) (ACUTE ONLY): 33 min   Charges:   PT Evaluation $PT Eval Low Complexity: 1 Low PT Treatments $Gait Training: 8-22 mins        Kreg Shropshire, DPT 03/28/2022, 4:07 PM

## 2022-03-28 NOTE — Care Management Obs Status (Signed)
[place] NOTIFICATION   Patient Details  Name: DONATELLO MCGAFFEY MRN: [id number] Date of Birth: 1928-11-12   Medicare Observation Status Notification Given:  Yes    Beverly Sessions, RN 03/28/2022, 12:57 PM

## 2022-03-29 DIAGNOSIS — J449 Chronic obstructive pulmonary disease, unspecified: Secondary | ICD-10-CM

## 2022-03-29 DIAGNOSIS — N1831 Chronic kidney disease, stage 3a: Secondary | ICD-10-CM | POA: Insufficient documentation

## 2022-03-29 LAB — CBC
HCT: 24.7 % — ABNORMAL LOW (ref 39.0–52.0)
Hemoglobin: 7.9 g/dL — ABNORMAL LOW (ref 13.0–17.0)
MCH: 30 pg (ref 26.0–34.0)
MCHC: 32 g/dL (ref 30.0–36.0)
MCV: 93.9 fL (ref 80.0–100.0)
Platelets: 299 10*3/uL (ref 150–400)
RBC: 2.63 MIL/uL — ABNORMAL LOW (ref 4.22–5.81)
RDW: 18.6 % — ABNORMAL HIGH (ref 11.5–15.5)
WBC: 17.6 10*3/uL — ABNORMAL HIGH (ref 4.0–10.5)
nRBC: 0.1 % (ref 0.0–0.2)

## 2022-03-29 LAB — BPAM RBC
Blood Product Expiration Date: 202403022359
ISSUE DATE / TIME: 202402291852
Unit Type and Rh: 9500

## 2022-03-29 LAB — TYPE AND SCREEN
ABO/RH(D): O POS
Antibody Screen: NEGATIVE
Unit division: 0

## 2022-03-29 LAB — BASIC METABOLIC PANEL
Anion gap: 12 (ref 5–15)
BUN: 33 mg/dL — ABNORMAL HIGH (ref 8–23)
CO2: 30 mmol/L (ref 22–32)
Calcium: 8.4 mg/dL — ABNORMAL LOW (ref 8.9–10.3)
Chloride: 99 mmol/L (ref 98–111)
Creatinine, Ser: 1.31 mg/dL — ABNORMAL HIGH (ref 0.61–1.24)
GFR, Estimated: 51 mL/min — ABNORMAL LOW (ref >60)
Glucose, Bld: 115 mg/dL — ABNORMAL HIGH (ref 70–99)
Potassium: 3.8 mmol/L (ref 3.5–5.1)
Sodium: 141 mmol/L (ref 135–145)

## 2022-03-29 LAB — FERRITIN: Ferritin: 92 ng/mL (ref 24–336)

## 2022-03-29 MED ORDER — METHYLPREDNISOLONE SODIUM SUCC 40 MG IJ SOLR
40.0000 mg | Freq: Every day | INTRAMUSCULAR | Status: DC
  Administered 2022-03-29: 40 mg via INTRAVENOUS
  Filled 2022-03-29: qty 1

## 2022-03-29 MED ORDER — ALBUTEROL SULFATE (2.5 MG/3ML) 0.083% IN NEBU
2.5000 mg | INHALATION_SOLUTION | Freq: Four times a day (QID) | RESPIRATORY_TRACT | Status: DC
  Administered 2022-03-29: 2.5 mg via RESPIRATORY_TRACT
  Filled 2022-03-29: qty 3

## 2022-03-29 MED ORDER — MAGNESIUM OXIDE -MG SUPPLEMENT 400 (240 MG) MG PO TABS: 400.0000 mg | ORAL_TABLET | Freq: Every day | ORAL | 0 refills | Status: DC

## 2022-03-29 MED ORDER — CEPHALEXIN 500 MG PO CAPS: 500.0000 mg | ORAL_CAPSULE | Freq: Two times a day (BID) | ORAL | 0 refills | Status: AC

## 2022-03-29 NOTE — TOC Transition Note (Signed)
Transition of Care [facility]) - CM/SW Discharge Note   Patient Details  Name: [name] MRN: [id number] Date of Birth: 11-Nov-1928  [facility] CM/SW Contact:  Ross Ludwig, LCSW Phone Number: 03/29/2022, 9:32 PM   Clinical Narrative:     CSW spoke to patient's daughter [name] to discuss PT recommendations for home health.  CSW explained that the home health agency [facility] has agreed to accept patient if he is interested.  Per patient and his daughter, patient decided they do not home health.  CSW explained the benefits of it, and if they decide against it, he will have to contact his PCP to get home health set up and it take longer sometimes.  CSW suggested that they accept home health and then if they decide he does not want it anymore they just cancel it.  Per patient and daughter they are still very adamant about not wanting home health.  CSW acknowledged their decision and notified bedside nurse that patient does not want home health now.  CSW asked if he needs any equipment and per patient and daughter they do not.  Daughter will transport patient back home.  TOC signing off, please reconsult if any other TOC needs arise.  Final next level of care: Home/Self Care Barriers to Discharge: Barriers Resolved   Patient Goals and CMS Choice CMS Medicare.gov Compare Post Acute Care list provided to:: Patient Represenative (must comment) Choice offered to / list presented to : Adult Children  Discharge Placement  Home without home health services per patient and family's choice.                       Discharge Plan and Services Additional resources added to the After Visit Summary for       Post Acute Care Choice: Home Health                    HH Arranged: Patient Refused [place] Agency: [place] Date [place]: 03/28/22   Representative spoke with at [place]: cory  Social Determinants of Health ([place]) Interventions SDOH  Screenings   Food Insecurity: No Food Insecurity (03/27/2022)  Housing: Low Risk  (03/27/2022)  Transportation Needs: No Transportation Needs (03/27/2022)  Utilities: Not At Risk (03/27/2022)  Tobacco Use: Medium Risk (03/27/2022)     Readmission Risk Interventions    10/10/2019    4:22 PM  Readmission Risk Prevention Plan  Transportation Screening Complete  PCP or Specialist Appt within 3-5 Days Complete  HRI or [place] Complete  Social Work Consult for [place] Planning/Counseling Complete  Palliative Care Screening Complete  Medication Review Press photographer) Complete

## 2022-03-29 NOTE — Discharge Summary (Signed)
Physician Discharge Summary   Patient: [name] MRN: [id number] DOB: 09/05/1928  Admit date:     03/27/2022  Discharge date: 03/29/22  Discharge Physician: Loletha Grayer   PCP: Baxter Hire, MD   Recommendations at discharge:   Follow-up PCP 5 days Follow-up wound care center  Discharge Diagnoses: Principal Problem:   Acute blood loss anemia Active Problems:   Arm wound, right, subsequent encounter   Abnormal urinalysis   Hypokalemia   Chronic diastolic CHF (congestive heart failure) (HCC)   S/P right carotid endarterectomy 03/18/22   Frailty   Atrial fibrillation, chronic (HCC)   COPD (chronic obstructive pulmonary disease) (HCC)   Chronic respiratory failure with hypoxia (HCC)   PVD (peripheral vascular disease) ([place])   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 male with medical history significant for HFpEF with last EF of 60-65 % 02/28/2022, COPD and severe pulmonary hypertension with chronic hypoxic respiratory failure on 3 L, paroxysmal A-fib on Eliquis, coronary artery disease, , hypertension, hyperlipidemia admitted on 2/1 to 2/2 with acute left vision loss secondary to central retinal artery occlusion with workup showing a 90% right ICA stenosis, for which he underwent right carotid angioplasty with stent placement on 03/19/2022 and started on aspirin in addition to his Eliquis, who presents to the ED with persistent oozing from an wound on his arm that he sustained while in the hospital.  Following his discharge she had intermittent oozing and he went to the wound clinic the day prior and it was cauterized with silver nitrate however the oozing continued and a Surgicel dressing was placed today which appeared to stop the bleeding.  He was brought to the emergency room because in addition to the earlier oozing, patient has been increasingly weak  and with poor appetite and has had some breathing difficulty beyond his baseline.  He has not had a cough, chest pain, fever or chills and denies abdominal pain, vomiting, diarrhea or dysuria.  He continues to take his Eliquis and baby aspirin. ED course and data review: On arrival afebrile, BP 109/62 with pulse 83, respirations 22 and O2 sat 92% on oxygen at home flow rate.  Labs notable for troponin 28, BNP 956.  Hemoglobin 8 down from 10 a week prior WBC 12,100.  Potassium 2.9,Creatinine 1.29 which is above baseline.  Urinalysis showing large leukocyte esterase.  INR 1.3 EKG, personally viewed and interpreted shows sinus tachycardia at 106 with RBBB chest x-ray shows stable cardiomegaly otherwise nonacute. Patient was treated with a 1 L NS bolus and started on ceftriaxone for possible UTI The ED provider noted that patient had a dusky appearance of bilateral upper extremities on physical exam.  Radial pulses were both dopplerable.  He discussed this with vascular surgeon, Dr. Lucky Cowboy who will see patient if admitted. Hospitalist consulted for admission.  2/29.  Hemoglobin dipped down to 6.9.  Will give 1 unit of packed red blood cells.  Benefits and risks explained.  Will also give IV iron.  He had on urine culture with squamous epithelial cells cells 0-5 and white blood cell count greater than 50. 3/1.  Hemoglobin up to 7.9 after transfusion.  No bleeding from arm today.  Did well with physical therapy.  This morning I did hear a wheeze but I given nebulizer treatment and steroid and improved.  Assessment and Plan: * Acute blood loss  anemia Wound right forearm/oozing from skin tear. No further bleeding.  Follow-up at the wound care center.  Keep dressing on until then.  Hemoglobin up to 7.9 after transfusion.  Patient also received IV iron.  Arm wound, right, subsequent encounter Will need follow-up at the wound care center.    Abnormal urinalysis Added on a urine culture which so far is showing no  growth.  Patient received 2 days of Rocephin here.  Urine analysis white blood cell count greater than 50 and squamous epithelial cells 0-5.  Empirically given a few more days of Keflex upon going home.  This will also cover right arm.  Hypokalemia Replaced aggressively yesterday.  Continue potassium supplementation and magnesium supplementation at home.  Chronic diastolic CHF (congestive heart failure) (HCC) Torsemide daily.   Frailty Physical therapy recommended home health  S/P right carotid endarterectomy 03/18/22 Patient had a 90% right ICA stenosis. Continue aspirin, Eliquis and atorvastatin  Atrial fibrillation, chronic (HCC) Continue diltiazem, metoprolol and apixaban  Chronic respiratory failure with hypoxia (HCC) Continue supplemental oxygen  COPD (chronic obstructive pulmonary disease) ([place]) Wheeze heard this morning but cleared up pretty quickly with nebulizer treatment.  I did give a dose of steroid but will not continue the steroid upon disposition since the lungs cleared up quickly. Continue Trelegy Ellipta   Chronic kidney disease, stage 3a (HCC) Creatinine 1.31 with a GFR 51  Myocardial injury Elevated troponin secondary to severe anemia.  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  Continue anticoagulation.         Consultants: Vascular surgery, wound care nurse Procedures performed: None Disposition: Home health Diet recommendation:  Cardiac diet DISCHARGE MEDICATION: Allergies as of 03/29/2022       Reactions   Bee Venom Anaphylaxis        Medication List     STOP taking these medications    ciclopirox 0.77 % cream Commonly known as: LOPROX   ciclopirox 0.77 % Susp Commonly known as: LOPROX   mupirocin ointment 2 % Commonly known as: BACTROBAN       TAKE these medications    acetaminophen 500 MG  tablet Commonly known as: TYLENOL Take 500 mg by mouth every 6 (six) hours as needed for fever or pain.   alendronate 70 MG tablet Commonly known as: FOSAMAX Take 70 mg by mouth once a week. Sunday morning with full glass of water and sit up do not lay down   apixaban 2.5 MG Tabs tablet Commonly known as: ELIQUIS Take 2.5 mg by mouth 2 (two) times daily.   ascorbic acid 250 MG tablet Commonly known as: VITAMIN C Take 1 tablet (250 mg total) by mouth 2 (two) times daily.   aspirin EC 81 MG tablet Take 1 tablet (81 mg total) by mouth daily at 6 (six) AM. Swallow whole.   atorvastatin 40 MG tablet Commonly known as: LIPITOR Take 1 tablet (40 mg total) by mouth daily.   BENEFIBER PO Take by mouth. 2 teaspoons twice a day   CALCIUM 600/VITAMIN D PO Take by mouth.   Cartia XT 300 MG 24 hr capsule Generic drug: diltiazem Take 300 mg by mouth daily.   cephALEXin 500 MG capsule Commonly known as: KEFLEX Take 1 capsule (500 mg total) by mouth 2 (two) times daily for 3 days.   magnesium oxide 400 (240 Mg) MG tablet Commonly known as: MAG-OX Take 1  tablet (400 mg total) by mouth daily. Start taking on: March 30, 2022   metoprolol tartrate 50 MG tablet Commonly known as: LOPRESSOR Take 50 mg by mouth 2 (two) times daily.   multivitamin with minerals Tabs tablet Take 1 tablet by mouth daily.   potassium chloride 10 MEQ tablet Commonly known as: KLOR-CON M Take 2 tablets (20 mEq total) by mouth daily. What changed: how much to take   torsemide 20 MG tablet Commonly known as: DEMADEX Take '40mg'$  on M, W, F and '20mg'$  on Tu, Thur,Sat & 'Sunday What changed:  how much to take how to take this when to take this additional instructions   Trelegy Ellipta 100-62.5-25 MCG/ACT Aepb Generic drug: Fluticasone-Umeclidin-Vilant Inhale into the lungs.   Vitamin D (Ergocalciferol) 1.25 MG (50000 UNIT) Caps capsule Commonly known as: DRISDOL Take 1 capsule (50,000 Units total) by  mouth every 7 (seven) days.        Follow-up Information     Johnston, John D, MD Follow up on 04/03/2022.   Specialty: Internal Medicine Why: Go at 3:45pm. Contact information: [address] [place] [address] [phone]         keep appointment wound care center Follow up in 1 week(s).                 Discharge Exam: Physical Exam HENT:     Head: Normocephalic.     Mouth/Throat:     Pharynx: No oropharyngeal exudate.  Eyes:     General: Lids are normal.  Cardiovascular:     Rate and Rhythm: Normal rate and regular rhythm.     Heart sounds: Normal heart sounds, S1 normal and S2 normal.  Pulmonary:     Breath sounds: No decreased breath sounds, wheezing, rhonchi or rales.  Abdominal:     Palpations: Abdomen is soft.     Tenderness: There is no abdominal tenderness.  Musculoskeletal:     Right lower leg: No swelling.     Left lower leg: No swelling.  Skin:    General: Skin is warm.     Comments: Right arm area of dried blood underneath the Surgicel.  Neurological:     Mental Status: He is alert and oriented to person, place, and time.      Condition at discharge: good  The results of significant diagnostics from this hospitalization (including imaging, microbiology, ancillary and laboratory) are listed below for reference.   Imaging Studies: DG Chest 2 View  Result Date: 03/27/2022 CLINICAL DATA:  Shortness of breath and weakness EXAM: CHEST - 2 VIEW COMPARISON:  Chest x-ray 10/08/2019.  CT of the chest 10/08/2019. FINDINGS: Emphysematous changes are again seen. There is stable scarring in the lung apices. There is no new focal lung infiltrate or pleural effusion. There is no pneumothorax. The heart is enlarged, unchanged. No acute fractures are seen. There are chronic compression deformities of thoracic vertebral bodies similar to the prior study. IMPRESSION: 1. No acute cardiopulmonary process. 2. Stable cardiomegaly. 3. Emphysema.  Electronically Signed   By: Amy  Guttmann M.D.   On: 03/27/2022 17:34   PERIPHERAL VASCULAR CATHETERIZATION  Result Date: 03/18/2022 See surgical note for result.  ECHOCARDIOGRAM COMPLETE  Result Date: 03/01/2022    ECHOCARDIOGRAM REPORT   Patient Name:   Soham L Lozito Date of Exam: 03/01/2022 Medical Rec #:  [id number]         Height:       72'$ .0 in Accession #:    [id number]  Weight:       143.1 lb Date of Birth:  [birth year]         BSA:          1.848 m Patient Age:    89 years          BP:           133/67 mmHg Patient Gender: M                 HR:           87 bpm. Exam Location:  [facility] Procedure: 2D Echo, Cardiac Doppler and Color Doppler Indications:     Stroke  History:         Patient has prior history of Echocardiogram examinations, most                  recent 10/10/2019. CHF, CAD, COPD, Arrythmias:Atrial                  Fibrillation; Risk Factors:Hypertension and Dyslipidemia.  Sonographer:     Wenda Low Referring Phys:  [id number] Val Riles Diagnosing Phys: Ida Rogue MD IMPRESSIONS  1. Left ventricular ejection fraction, by estimation, is 60 to 65%. The left ventricle has normal function. The left ventricle has no regional wall motion abnormalities. There is mild left ventricular hypertrophy. Left ventricular diastolic parameters are indeterminate.  2. Right ventricular systolic function is mildly reduced. The right ventricular size is moderately enlarged. There is severely elevated pulmonary artery systolic pressure. The estimated right ventricular systolic pressure is [id number] mmHg.  3. Left atrial size was moderately dilated.  4. Right atrial size was severely dilated.  5. The mitral valve is normal in structure. Mild to moderate mitral valve regurgitation. No evidence of mitral stenosis.  6. Tricuspid valve regurgitation is moderate.  7. The aortic valve is normal in structure. There is moderate calcification of the aortic valve. Aortic valve regurgitation is mild. Moderate  aortic valve stenosis. Aortic valve area, by VTI measures 0.86 cm. Aortic valve mean gradient measures 25.5 mmHg. Aortic valve Vmax measures 3.64 m/s.  8. The inferior vena cava is normal in size with greater than 50% respiratory variability, suggesting right atrial pressure of 3 mmHg. FINDINGS  Left Ventricle: Left ventricular ejection fraction, by estimation, is 60 to 65%. The left ventricle has normal function. The left ventricle has no regional wall motion abnormalities. The left ventricular internal cavity size was normal in size. There is  mild left ventricular hypertrophy. Left ventricular diastolic parameters are indeterminate. Right Ventricle: The right ventricular size is moderately enlarged. No increase in right ventricular wall thickness. Right ventricular systolic function is mildly reduced. There is severely elevated pulmonary artery systolic pressure. The tricuspid regurgitant velocity is 3.96 m/s, and with an assumed right atrial pressure of 15 mmHg, the estimated right ventricular systolic pressure is [id number] mmHg. Left Atrium: Left atrial size was moderately dilated. Right Atrium: Right atrial size was severely dilated. Pericardium: There is no evidence of pericardial effusion. Mitral Valve: The mitral valve is normal in structure. Mild to moderate mitral valve regurgitation. No evidence of mitral valve stenosis. MV peak gradient, 6.0 mmHg. The mean mitral valve gradient is 1.0 mmHg. Tricuspid Valve: The tricuspid valve is normal in structure. Tricuspid valve regurgitation is moderate . No evidence of tricuspid stenosis. Aortic Valve: The aortic valve is normal in structure. There is moderate calcification of the aortic valve. Aortic valve regurgitation is mild. Aortic regurgitation PHT measures 428 msec. Moderate aortic stenosis  is present. Aortic valve mean gradient measures 25.5 mmHg. Aortic valve peak gradient measures 52.9 mmHg. Aortic valve area, by VTI measures 0.86 cm. Pulmonic Valve: The  pulmonic valve was normal in structure. Pulmonic valve regurgitation is not visualized. No evidence of pulmonic stenosis. Aorta: The aortic root is normal in size and structure. Venous: The inferior vena cava is normal in size with greater than 50% respiratory variability, suggesting right atrial pressure of 3 mmHg. IAS/Shunts: No atrial level shunt detected by color flow Doppler.  LEFT VENTRICLE PLAX 2D LVIDd:         3.70 cm LVIDs:         2.30 cm LV PW:         1.60 cm LV IVS:        1.30 cm LVOT diam:     2.00 cm LV SV:         64 LV SV Index:   35 LVOT Area:     3.14 cm  RIGHT VENTRICLE RV Basal diam:  5.40 cm RV Mid diam:    4.30 cm RV S prime:     14.90 cm/s LEFT ATRIUM             Index        RIGHT ATRIUM           Index LA diam:        5.20 cm 2.81 cm/m   RA Area:     30.50 cm LA Vol (A2C):   95.3 ml 51.57 ml/m  RA Volume:   120.00 ml 64.94 ml/m LA Vol (A4C):   90.8 ml 49.14 ml/m LA Biplane Vol: 96.2 ml 52.06 ml/m  AORTIC VALVE                     PULMONIC VALVE AV Area (Vmax):    0.84 cm      PV Vmax:       1.48 m/s AV Area (Vmean):   0.83 cm      PV Peak grad:  8.8 mmHg AV Area (VTI):     0.86 cm AV Vmax:           363.50 cm/s AV Vmean:          234.000 cm/s AV VTI:            0.745 m AV Peak Grad:      52.9 mmHg AV Mean Grad:      25.5 mmHg LVOT Vmax:         97.00 cm/s LVOT Vmean:        62.150 cm/s LVOT VTI:          0.204 m LVOT/AV VTI ratio: 0.27 AI PHT:            428 msec  AORTA Ao Root diam: 3.10 cm Ao Asc diam:  3.10 cm MITRAL VALVE               TRICUSPID VALVE MV Area (PHT): 3.81 cm    TR Peak grad:   62.7 mmHg MV Area VTI:   2.15 cm    TR Vmax:        396.00 cm/s MV Peak grad:  6.0 mmHg MV Mean grad:  1.0 mmHg    SHUNTS MV Vmax:       1.22 m/s    Systemic VTI:  0.20 m MV Vmean:      42.6 cm/s   Systemic Diam: 2.00 cm MV Decel Time: 199  msec MV E velocity: 93.30 cm/s Ida Rogue MD Electronically signed by Ida Rogue MD Signature Date/Time: 03/01/2022/3:40:49 PM    Final     MR BRAIN WO CONTRAST  Result Date: 03/01/2022 CLINICAL DATA:  Acute neurologic deficit EXAM: MRI HEAD WITHOUT CONTRAST MRA HEAD WITHOUT CONTRAST MRA NECK WITHOUT AND WITH CONTRAST TECHNIQUE: Multiplanar, multi-echo pulse sequences of the brain and surrounding structures were acquired without intravenous contrast. Angiographic images of the Circle of Willis were acquired using MRA technique without intravenous contrast. Angiographic images of the neck were acquired using MRA technique without and with intravenous contrast. Carotid stenosis measurements (when applicable) are obtained utilizing NASCET criteria, using the distal internal carotid diameter as the denominator. CONTRAST:  45m GADAVIST GADOBUTROL 1 MMOL/ML IV SOLN COMPARISON:  None Available. FINDINGS: MRI HEAD FINDINGS Brain: No acute infarct, mass effect or extra-axial collection. Single left occipital chronic microhemorrhage. There is multifocal hyperintense T2-weighted signal within the white matter. Generalized volume loss. The midline structures are normal. Vascular: Normal flow voids. Skull and upper cervical spine: Normal marrow signal. Sinuses/Orbits: No acute or significant finding. Other: None. MRA HEAD FINDINGS POSTERIOR CIRCULATION: --Vertebral arteries: Normal --Inferior cerebellar arteries: Normal. --Basilar artery: Normal. --Superior cerebellar arteries: Normal. --Posterior cerebral arteries: Normal. ANTERIOR CIRCULATION: --Intracranial internal carotid arteries: Normal. --Anterior cerebral arteries (ACA): Normal. --Middle cerebral arteries (MCA): Normal. MRA NECK FINDINGS Aortic arch: Normal branching pattern.  Unremarkable. Right carotid system: Severe stenosis at the carotid bifurcation extending into the ICA with angiographic string sign. Stenosis is approximately 90%. Left carotid system: Atherosclerosis at the left carotid bifurcation without hemodynamically significant stenosis. Vertebral arteries: Normal Other: None IMPRESSION:  1. No acute intracranial abnormality. 2. No emergent large vessel occlusion. 3. Severe stenosis of the right internal carotid artery at the carotid bifurcation extending into the ICA with angiographic string sign. Stenosis is approximately 90%. Electronically Signed   By: KUlyses JarredM.D.   On: 03/01/2022 00:23   MR ANGIO HEAD WO CONTRAST  Result Date: 03/01/2022 CLINICAL DATA:  Acute neurologic deficit EXAM: MRI HEAD WITHOUT CONTRAST MRA HEAD WITHOUT CONTRAST MRA NECK WITHOUT AND WITH CONTRAST TECHNIQUE: Multiplanar, multi-echo pulse sequences of the brain and surrounding structures were acquired without intravenous contrast. Angiographic images of the Circle of Willis were acquired using MRA technique without intravenous contrast. Angiographic images of the neck were acquired using MRA technique without and with intravenous contrast. Carotid stenosis measurements (when applicable) are obtained utilizing NASCET criteria, using the distal internal carotid diameter as the denominator. CONTRAST:  613mGADAVIST GADOBUTROL 1 MMOL/ML IV SOLN COMPARISON:  None Available. FINDINGS: MRI HEAD FINDINGS Brain: No acute infarct, mass effect or extra-axial collection. Single left occipital chronic microhemorrhage. There is multifocal hyperintense T2-weighted signal within the white matter. Generalized volume loss. The midline structures are normal. Vascular: Normal flow voids. Skull and upper cervical spine: Normal marrow signal. Sinuses/Orbits: No acute or significant finding. Other: None. MRA HEAD FINDINGS POSTERIOR CIRCULATION: --Vertebral arteries: Normal --Inferior cerebellar arteries: Normal. --Basilar artery: Normal. --Superior cerebellar arteries: Normal. --Posterior cerebral arteries: Normal. ANTERIOR CIRCULATION: --Intracranial internal carotid arteries: Normal. --Anterior cerebral arteries (ACA): Normal. --Middle cerebral arteries (MCA): Normal. MRA NECK FINDINGS Aortic arch: Normal branching pattern.   Unremarkable. Right carotid system: Severe stenosis at the carotid bifurcation extending into the ICA with angiographic string sign. Stenosis is approximately 90%. Left carotid system: Atherosclerosis at the left carotid bifurcation without hemodynamically significant stenosis. Vertebral arteries: Normal Other: None IMPRESSION: 1. No acute intracranial abnormality. 2. No emergent large vessel occlusion. 3.  Severe stenosis of the right internal carotid artery at the carotid bifurcation extending into the ICA with angiographic string sign. Stenosis is approximately 90%. Electronically Signed   By: Ulyses Jarred M.D.   On: 03/01/2022 00:23   MR ANGIO NECK W WO CONTRAST  Result Date: 03/01/2022 CLINICAL DATA:  Acute neurologic deficit EXAM: MRI HEAD WITHOUT CONTRAST MRA HEAD WITHOUT CONTRAST MRA NECK WITHOUT AND WITH CONTRAST TECHNIQUE: Multiplanar, multi-echo pulse sequences of the brain and surrounding structures were acquired without intravenous contrast. Angiographic images of the Circle of Willis were acquired using MRA technique without intravenous contrast. Angiographic images of the neck were acquired using MRA technique without and with intravenous contrast. Carotid stenosis measurements (when applicable) are obtained utilizing NASCET criteria, using the distal internal carotid diameter as the denominator. CONTRAST:  60m GADAVIST GADOBUTROL 1 MMOL/ML IV SOLN COMPARISON:  None Available. FINDINGS: MRI HEAD FINDINGS Brain: No acute infarct, mass effect or extra-axial collection. Single left occipital chronic microhemorrhage. There is multifocal hyperintense T2-weighted signal within the white matter. Generalized volume loss. The midline structures are normal. Vascular: Normal flow voids. Skull and upper cervical spine: Normal marrow signal. Sinuses/Orbits: No acute or significant finding. Other: None. MRA HEAD FINDINGS POSTERIOR CIRCULATION: --Vertebral arteries: Normal --Inferior cerebellar arteries: Normal.  --Basilar artery: Normal. --Superior cerebellar arteries: Normal. --Posterior cerebral arteries: Normal. ANTERIOR CIRCULATION: --Intracranial internal carotid arteries: Normal. --Anterior cerebral arteries (ACA): Normal. --Middle cerebral arteries (MCA): Normal. MRA NECK FINDINGS Aortic arch: Normal branching pattern.  Unremarkable. Right carotid system: Severe stenosis at the carotid bifurcation extending into the ICA with angiographic string sign. Stenosis is approximately 90%. Left carotid system: Atherosclerosis at the left carotid bifurcation without hemodynamically significant stenosis. Vertebral arteries: Normal Other: None IMPRESSION: 1. No acute intracranial abnormality. 2. No emergent large vessel occlusion. 3. Severe stenosis of the right internal carotid artery at the carotid bifurcation extending into the ICA with angiographic string sign. Stenosis is approximately 90%. Electronically Signed   By: KUlyses JarredM.D.   On: 03/01/2022 00:23   CT HEAD WO CONTRAST  Result Date: 02/28/2022 CLINICAL DATA:  Neuro deficit, acute, stroke suspected. Left eye vision loss x 4 days. EXAM: CT HEAD WITHOUT CONTRAST TECHNIQUE: Contiguous axial images were obtained from the base of the skull through the vertex without intravenous contrast. RADIATION DOSE REDUCTION: This exam was performed according to the departmental dose-optimization program which includes automated exposure control, adjustment of the mA and/or kV according to patient size and/or use of iterative reconstruction technique. COMPARISON:  Head CT 06/25/2011 FINDINGS: Brain: There is no evidence of an acute infarct, intracranial hemorrhage, mass, midline shift, or extra-axial fluid collection. Patchy hypodensities in the cerebral white matter bilaterally have mildly progressed and are nonspecific but compatible with moderate chronic small vessel ischemic disease. There is mild cerebral atrophy. Vascular: Calcified atherosclerosis at the skull base. No  hyperdense vessel. Skull: No acute fracture or suspicious osseous lesion. Sinuses/Orbits: No significant inflammatory changes in the included paranasal sinuses. Clear mastoid air cells. Bilateral cataract extraction. Other: None. IMPRESSION: 1. No evidence of acute intracranial abnormality. 2. Moderate chronic small vessel ischemic disease. Electronically Signed   By: ALogan BoresM.D.   On: 02/28/2022 13:02    Microbiology: Results for orders placed or performed during the hospital encounter of 03/27/22  Urine Culture (for pregnant, neutropenic or urologic patients or patients with an indwelling urinary catheter)     Status: None (Preliminary result)   Collection Time: 03/27/22  5:58 PM   Specimen: Urine, Random  Result Value Ref Range Status   Specimen Description   Final    URINE, RANDOM Performed at [facility], [address] [place], [place] [address]    Special Requests   Final    NONE Performed at [facility], [address] [place], [place] [address]    Culture   Final    NO GROWTH 1 DAY Performed at [facility], [place] [address], [place], [place] [address]    Report Status PENDING  Incomplete    Labs: CBC: Recent Labs  Lab 03/27/22 1340 03/27/22 2326 03/28/22 0255 03/28/22 0929 03/29/22 0129  WBC 12.1*  --   --   --  17.6*  NEUTROABS 7.6  --   --   --   --   HGB 8.0* 7.6* 7.1* 6.9* 7.9*  HCT 24.6* 23.0* 21.8* 21.8* 24.7*  MCV 96.1  --   --   --  93.9  PLT 320  --   --   --  [id number]   Basic Metabolic Panel: Recent Labs  Lab 03/27/22 1339 03/27/22 1700 03/29/22 0129  NA 135  --  141  K 2.9*  --  3.8  CL 93*  --  99  CO2 32  --  30  GLUCOSE 114*  --  115*  BUN 39*  --  33*  CREATININE 1.29*  --  1.31*  CALCIUM 9.3  --  8.4*  MG  --  1.9  --      Discharge time spent: greater than 30 minutes.  Signed: Loletha Grayer, MD [facility] 03/29/2022

## 2022-03-29 NOTE — Progress Notes (Signed)
Physical Therapy Treatment Patient Details Name: [name] MRN: [id number] DOB: 03-21-1928 Today's Date: 03/29/2022   History of Present Illness 89 y.o. male with medical history significant for HFpEF with last EF of 60-65 % 02/28/2022, COPD and severe pulmonary hypertension with chronic hypoxic respiratory failure on 3 L, paroxysmal A-fib on Eliquis, coronary artery disease, , hypertension, hyperlipidemia admitted on 2/1 to 2/2 with acute left vision loss secondary to central retinal artery occlusion with workup showing a 90% right ICA stenosis, for which he underwent right carotid angioplasty with stent placement on 03/19/2022 and started on aspirin in addition to his Eliquis, who presents to the ED with persistent oozing from an wound on his arm.  admitted with anemia    PT Comments    PT worked with pt twice this morning. 2nd session with caregiver/daughter present. Pt is A and O and agrees to session both times. He was able to ambulate 1 x 120 with RW then 1 x 120 with SPC. No LOB. Pt was on his baseline 3 L o2 with sao2 > 92% throughout. Daughter and pt both state confidence in plan to DC home today. Recommend HHPT to follow at DC to maximize pt's strength, independence, and safety with all ADLs.     Recommendations for follow up therapy are one component of a multi-disciplinary discharge planning process, led by the attending physician.  Recommendations may be updated based on patient status, additional functional criteria and insurance authorization.  Follow Up Recommendations  Home health PT     Assistance Recommended at Discharge Intermittent Supervision/Assistance  Patient can return home with the following A little help with bathing/dressing/bathroom;Assistance with cooking/housework;A little help with walking and/or transfers;Assist for transportation;Help with stairs or ramp for entrance   Equipment Recommendations  None recommended by PT       Precautions / Restrictions  Precautions Precautions: Fall Restrictions Weight Bearing Restrictions: No     Mobility  Bed Mobility Overal bed mobility: Needs Assistance Bed Mobility: Supine to Sit, Sit to Supine  Supine to sit: Supervision Sit to supine: Supervision   Transfers Overall transfer level: Modified independent Equipment used: Rolling walker (2 wheels), Straight cane  General transfer comment: no physical assistance required to stand from recliner to RW or [facility]    Ambulation/Gait Ambulation/Gait assistance: Supervision, Min guard Gait Distance (Feet): 120 Feet Assistive device: Rolling walker (2 wheels), Straight cane Gait Pattern/deviations: Step-through pattern Gait velocity: decreased    General Gait Details: pt ambulated 2 x this date. 1 x with RW and 1 x with SPC. Per daughter, "he only will use SPC at home.   Balance Overall balance assessment: Needs assistance Sitting-balance support: No upper extremity supported Sitting balance-Leahy Scale: Good     Standing balance support: Single extremity supported Standing balance-Leahy Scale: Good       Cognition Arousal/Alertness: Awake/alert Behavior During Therapy: WFL for tasks assessed/performed Overall Cognitive Status: Within Functional Limits for tasks assessed          General Comments General comments (skin integrity, edema, etc.): pt demonstrated good tolerance to gait withoput LOB. discussed POC with MD/ daughter. Pt feels safe to DC home with his daughter later today.      Pertinent Vitals/Pain Pain Assessment Pain Assessment: No/denies pain     PT Goals (current goals can now be found in the care plan section) Acute Rehab PT Goals Patient Stated Goal: go home with my daughter today Progress towards PT goals: Progressing toward goals    Frequency  Min 2X/week      PT Plan Current plan remains appropriate       AM-PAC PT "6 Clicks" Mobility   Outcome Measure  Help needed turning from your back to your  side while in a flat bed without using bedrails?: None Help needed moving from lying on your back to sitting on the side of a flat bed without using bedrails?: None Help needed moving to and from a bed to a chair (including a wheelchair)?: A Little Help needed standing up from a chair using your arms (e.g., wheelchair or bedside chair)?: A Little Help needed to walk in hospital room?: A Little Help needed climbing 3-5 steps with a railing? : A Little 6 Click Score: 20    End of Session   Activity Tolerance: Patient tolerated treatment well;Patient limited by fatigue Patient left: in chair;with call bell/phone within reach;with nursing/sitter in room Nurse Communication: Mobility status PT Visit Diagnosis: Muscle weakness (generalized) (M62.81);Difficulty in walking, not elsewhere classified (R26.2)     Time: [id number] PT Time Calculation (min) (ACUTE ONLY): 39 min  Charges:  $Gait Training: 23-37 mins $Therapeutic Activity: 8-22 mins                    Julaine Fusi PTA 03/29/22, 1:21 PM

## 2022-03-29 NOTE — Assessment & Plan Note (Signed)
Creatinine 1.31 with a GFR 51

## 2022-03-30 LAB — URINE CULTURE: Culture: NO GROWTH

## 2022-03-31 NOTE — Progress Notes (Signed)
SAAD, BRANSFIELD ([id number]) [id number] Page 1 of 6 Visit Report for 03/27/2022 Chief Complaint Document Details Patient Name: Date of Service: [name], [name] 03/27/2022 9:45 A M Medical Record Number: [id number] Patient Account Number: [id number] Date of Birth/Sex: Treating RN: [birth year] (89 y.o. [name] Primary Care Provider: Harrel Lemon Other Clinician: Referring Provider: Treating Provider/Extender: Caffie Damme in Treatment: 0 Information Obtained from: Patient Chief Complaint Right arm skin tear Electronic Signature(s) Signed: 03/27/2022 12:14:48 PM By: Kalman Shan DO Entered By: Kalman Shan on 03/27/2022 10:49:50 -------------------------------------------------------------------------------- HPI Details Patient Name: Date of Service: [name], [name] 03/27/2022 9:45 A M Medical Record Number: [id number] Patient Account Number: [id number] Date of Birth/Sex: Treating RN: [birth year] (89 y.o. [name] Primary Care Provider: Harrel Lemon Other Clinician: Referring Provider: Treating Provider/Extender: Caffie Damme in Treatment: 0 History of Present Illness HPI Description: 03/27/2022; patient presents for follow-up. He was seen yesterday in our clinic for the first time for a skin tear to his right arm. He was recently hospitalized on 03/18/2018 for a right carotid artery stent. During that hospitalization he developed a skin tear and has had difficulty controlling the bleeding. He was seen in our clinic yesterday and devitalized tissue was taken off and the site cauterized with silver nitrate. Despite this he continues to have active bleeding soaking through dressings. He is currently on Eliquis and aspirin. He states this is prescribed by his cardiologist. Daughter notes that patient appears more weak than usual. Vitals are overall stable with no tachycardia. Patient  is alert and oriented. Electronic Signature(s) Signed: 03/27/2022 12:14:48 PM By: Kalman Shan DO Entered By: Kalman Shan on 03/27/2022 10:52:57 [name] ([id number] Page 2 of 6 -------------------------------------------------------------------------------- Physical Exam Details Patient Name: Date of Service: [name], [name] 03/27/2022 9:45 A M Medical Record Number: [id number] Patient Account Number: [id number] Date of Birth/Sex: Treating RN: 28-Jan-1929 (89 y.o. [name] Primary Care Provider: Harrel Lemon Other Clinician: Referring Provider: Treating Provider/Extender: Caffie Damme in Treatment: 0 Constitutional . Psychiatric . Notes Right arm: T the elbow region there is a large circular wound with granulation tissue. Active bleeding from 1 small area. T the wrist area there is a small tear o o with serous fluid draining. No obvious acute signs of infection including increased warmth, erythema or purulent drainage. Significant bruising throughout the right arm to the shoulder region. Electronic Signature(s) Signed: 03/27/2022 12:14:48 PM By: Kalman Shan DO Entered By: Kalman Shan on 03/27/2022 10:55:03 -------------------------------------------------------------------------------- Physician Orders Details Patient Name: Date of Service: [name], [name] 03/27/2022 9:45 A M Medical Record Number: [id number] Patient Account Number: [id number] Date of Birth/Sex: Treating RN: 1928-12-12 (89 y.o. [name] Primary Care Provider: Harrel Lemon Other Clinician: Referring Provider: Treating Provider/Extender: Caffie Damme in Treatment: 0 Verbal / Phone Orders: No Diagnosis Coding Follow-up Appointments Return Appointment in 1 week. Nurse Visit as needed Bathing/ Shower/ Hygiene No tub bath. Additional Orders / Instructions Go to Emergency  Department of your choice for evaluation and treatment. - If bleeding continues, go to ED for follow-up. Wound Treatment Wound #1 - Upper Arm Wound Laterality: Right Cleanser: Normal Saline 2 x Per Day/30 Days TEX, HEIMER ([id number]) [id number] Page 3 of 6 Discharge Instructions: Wash your hands with soap and water. Remove old dressing, discard into plastic bag and place into trash. Cleanse the wound with Normal Saline prior to applying  a clean dressing using gauze sponges, not tissues or cotton balls. Do not scrub or use excessive force. Pat dry using gauze sponges, not tissue or cotton balls. Prim Dressing: Zetuvit Plus 4x8 (in/in) (DME) (Generic) 2 x Per Day/30 Days ary Discharge Instructions: Needed for excess bleeding/drainage Prim Dressing: Surgifoam (DME) (Generic) 3 x Per Week/7 Days ary Secondary Dressing: Kerlix 4.5 x 4.1 (in/yd) (DME) (Generic) 2 x Per Day/30 Days Discharge Instructions: Apply Kerlix 4.5 x 4.1 (in/yd) as instructed Consults Internal Medicine - Follow-up for blood loss, weakness and use of Eliquis Electronic Signature(s) Signed: 03/27/2022 12:14:48 PM By: Kalman Shan DO Signed: 03/28/2022 6:12:13 PM By: Gretta Cool, BSN, RN, CWS, Kim RN, BSN Entered By: Gretta Cool, BSN, RN, CWS, Kim on 03/27/2022 11:22:10 -------------------------------------------------------------------------------- Problem List Details Patient Name: Date of Service: [name], [name] 03/27/2022 9:45 A M Medical Record Number: [id number] Patient Account Number: [id number] Date of Birth/Sex: Treating RN: December 25, 1928 (89 y.o. [name], [name] Primary Care Provider: Harrel Lemon Other Clinician: Referring Provider: Treating Provider/Extender: Caffie Damme in Treatment: 0 Active Problems ICD-10 Encounter Code Description Active Date MDM Diagnosis S41.111A Laceration without foreign body of right upper arm, initial encounter 03/26/2022 No  Yes Z79.01 Long term (current) use of anticoagulants 03/26/2022 No Yes I73.89 Other specified peripheral vascular diseases 03/26/2022 No Yes J44.9 Chronic obstructive pulmonary disease, unspecified 03/26/2022 No Yes Inactive Problems Resolved Problems Electronic Signature(s) Signed: 03/27/2022 12:14:48 PM By: Rosebud Poles, [place] ([id number] Page 4 of 6 Entered By: Kalman Shan on 03/27/2022 10:49:45 -------------------------------------------------------------------------------- Progress Note Details Patient Name: Date of Service: [name], [name] 03/27/2022 9:45 A M Medical Record Number: [id number] Patient Account Number: [id number] Date of Birth/Sex: Treating RN: 1928/11/11 (89 y.o. [name] Primary Care Provider: Harrel Lemon Other Clinician: Referring Provider: Treating Provider/Extender: Caffie Damme in Treatment: 0 Subjective Chief Complaint Information obtained from Patient Right arm skin tear History of Present Illness (HPI) 03/27/2022; patient presents for follow-up. He was seen yesterday in our clinic for the first time for a skin tear to his right arm. He was recently hospitalized on 03/18/2018 for a right carotid artery stent. During that hospitalization he developed a skin tear and has had difficulty controlling the bleeding. He was seen in our clinic yesterday and devitalized tissue was taken off and the site cauterized with silver nitrate. Despite this he continues to have active bleeding soaking through dressings. He is currently on Eliquis and aspirin. He states this is prescribed by his cardiologist. Daughter notes that patient appears more weak than usual. Vitals are overall stable with no tachycardia. Patient is alert and oriented. Objective Constitutional Vitals Time Taken: 10:22 AM, Height: 69 in, Weight: 138 lbs, BMI: 20.4, Temperature: 97.9 F, Pulse: 89 bpm,  Respiratory Rate: 20 breaths/min, Blood Pressure: 125/57 mmHg. General Notes: Right arm: T the elbow region there is a large circular wound with granulation tissue. Active bleeding from 1 small area. T the wrist area there o o is a small tear with serous fluid draining. No obvious acute signs of infection including increased warmth, erythema or purulent drainage. Significant bruising throughout the right arm to the shoulder region. Integumentary (Hair, Skin) Wound #1 status is Open. Original cause of wound was Trauma. The date acquired was: 03/20/2022. The wound is located on the Right Upper Arm. The wound measures 4cm length x 5.5cm width x 0.2cm depth; 17.279cm^2 area and 3.456cm^3 volume. The wound is limited to skin breakdown. There  is no tunneling or undermining noted. There is a large amount of sanguinous drainage noted. The wound margin is distinct with the outline attached to the wound base. There is no granulation within the wound bed. There is no necrotic tissue within the wound bed. Assessment Active Problems ICD-10 Laceration without foreign body of right upper arm, initial encounter Long term (current) use of anticoagulants Other specified peripheral vascular diseases Chronic obstructive pulmonary disease, unspecified Daughter is present during the encounter. Patient has developed a skin tear to his right arm 1-2 weeks ago while hospitalized. Hes not sure how it happened. Per daughter patient's bleeding had never been well-controlled during his hospitalization. He presents today after being seen yesterday in our clinic due to his CHEVALIER, KARNITZ ([id number]) [id number] Page 5 of 6 dressings being saturated with blood. Patient is currently on Eliquis and aspirin. He recently had a carotid stent placed 1-2 weeks ago. At this time I recommended he discuss the case with his cardiologist to see if he can come off of Eliquis for at least 1 to 2 weeks to help  control the bleeding to his right arm. Overall vitals stable however daughter is concerned that patient is weak and losing blood. His hemoglobin was 10 on discharge from the hospital and has been at this level for the past 3 years. I recommended follow-up with primary care or cardiologist for blood work, CBC versus going to the ED. Patient has decided to go to the ED. For now I recommended surgi foam to help with bleeding control. I placed this in office today and he can keep this in place until he comes back to our clinic Monday next week. We gave him information on how to order this. They can continue to surgi foam or absorbant pads if needed. He does have a small tear to his wrist area that is draining serous fluid. I recommended antibiotic ointment here and absorbent pads that need to be changed daily or as needed. Plan Follow-up Appointments: Return Appointment in 1 week. Nurse Visit as needed Bathing/ Shower/ Hygiene: No tub bath. WOUND #1: - Upper Arm Wound Laterality: Right Cleanser: Normal Saline 2 x Per Day/30 Days Discharge Instructions: Wash your hands with soap and water. Remove old dressing, discard into plastic bag and place into trash. Cleanse the wound with Normal Saline prior to applying a clean dressing using gauze sponges, not tissues or cotton balls. Do not scrub or use excessive force. Pat dry using gauze sponges, not tissue or cotton balls. Prim Dressing: Zetuvit Plus 4x8 (in/in) (DME) (Generic) 2 x Per Day/30 Days ary Discharge Instructions: Needed for excess bleeding/drainage Prim Dressing: Surgifoam (DME) (Generic) 3 x Per Week/7 Days ary Secondary Dressing: Kerlix 4.5 x 4.1 (in/yd) (DME) (Generic) 2 x Per Day/30 Days Discharge Instructions: Apply Kerlix 4.5 x 4.1 (in/yd) as instructed 1. Surgi foam 2. Patient to discuss stopping Eliquis with his cardiologist 3. Patient to go to the ED 4. Follow up next week in our clinic Electronic Signature(s) Signed:  03/27/2022 12:14:48 PM By: Kalman Shan DO Entered By: Kalman Shan on 03/27/2022 11:08:10 -------------------------------------------------------------------------------- SuperBill Details Patient Name: Date of Service: [name], [name] 03/27/2022 Medical Record Number: [id number] Patient Account Number: [id number] Date of Birth/Sex: Treating RN: 30-Sep-1928 (89 y.o. [name] Primary Care Provider: Harrel Lemon Other Clinician: Referring Provider: Treating Provider/Extender: Caffie Damme in Treatment: 0 Diagnosis Coding ICD-10 Codes Code Description [phone] Laceration without foreign body of right upper arm, initial encounter  Z79.01 Long term (current) use of anticoagulants I73.89 Other specified peripheral vascular diseases J44.9 Chronic obstructive pulmonary disease, unspecified Facility Procedures Physician Procedures : CPT4 Code Description Modifier [id number] - WC PHYS LEVEL 4 - EST PT ICD-10 Diagnosis Description S41.111A Laceration without foreign body of right upper arm, initial encounter Z79.01 Long term (current) use of anticoagulants I73.89 Other specified  peripheral vascular diseases J44.9 Chronic obstructive pulmonary disease, unspecified Quantity: 1 Electronic Signature(s) Signed: 03/27/2022 12:14:48 PM By: Kalman Shan DO Signed: 03/28/2022 6:12:13 PM By: Gretta Cool, BSN, RN, CWS, Kim RN, BSN Entered By: Gretta Cool, BSN, RN, CWS, Kim on 03/27/2022 11:23:27

## 2022-03-31 NOTE — Progress Notes (Signed)
ALTO, MESMER ([id number]) [id number] Page 1 of 10 Visit Report for 03/27/2022 Arrival Information Details Patient Name: Date of Service: [name], Christopher IN Good. 03/27/2022 9:45 A M Medical Record Number: [id number] Patient Account Number: [id number] Date of Birth/Sex: Treating RN: 12-19-1928 (89 y.o. [name] Primary Care Shanyia Stines: Harrel Lemon Other Clinician: Referring Ahmaud Duthie: Treating Sharnita Bogucki/Extender: Caffie Damme in Treatment: 0 Visit Information Patient Arrived: Wheel Chair Arrival Time: 10:14 Accompanied By: daughter Transfer Assistance: None Patient Identification Verified: Yes Patient Has Alerts: Yes Patient Alerts: Patient on Blood Thinner '81mg'$  aspirin Eliquis NOT DIABETIC Electronic Signature(s) Signed: 03/28/2022 6:12:13 PM By: Gretta Cool, BSN, RN, CWS, Kim RN, BSN Entered By: Gretta Cool, BSN, RN, CWS, Kim on 03/27/2022 10:15:56 -------------------------------------------------------------------------------- Clinic Level of Care Assessment Details Patient Name: Date of Service: [name], Christopher IN Good. 03/27/2022 9:45 A M Medical Record Number: [id number] Patient Account Number: [id number] Date of Birth/Sex: Treating RN: 1928-10-07 (89 y.o. [name] Primary Care Kai Calico: Harrel Lemon Other Clinician: Referring Jessen Siegman: Treating Ashur Glatfelter/Extender: Caffie Damme in Treatment: 0 Clinic Level of Care Assessment Items TOOL 4 Quantity Score '[]'$  - 0 Use when only an EandM is performed on FOLLOW-UP visit ASSESSMENTS - Nursing Assessment / Reassessment X- 1 10 Reassessment of Co-morbidities (includes updates in patient status) X- 1 5 Reassessment of Adherence to Treatment Plan ASSESSMENTS - Wound and Skin A ssessment / Reassessment '[]'$  - 0 Simple Wound Assessment / Reassessment - one wound [name], [name] ([id number] Page 2 of 10 X- 1 5 Complex  Wound Assessment / Reassessment - multiple wounds '[]'$  - 0 Dermatologic / Skin Assessment (not related to wound area) ASSESSMENTS - Focused Assessment '[]'$  - 0 Circumferential Edema Measurements - multi extremities '[]'$  - 0 Nutritional Assessment / Counseling / Intervention '[]'$  - 0 Lower Extremity Assessment (monofilament, tuning fork, pulses) '[]'$  - 0 Peripheral Arterial Disease Assessment (using hand held doppler) ASSESSMENTS - Ostomy and/or Continence Assessment and Care '[]'$  - 0 Incontinence Assessment and Management '[]'$  - 0 Ostomy Care Assessment and Management (repouching, etc.) PROCESS - Coordination of Care '[]'$  - 0 Simple Patient / Family Education for ongoing care X- 1 20 Complex (extensive) Patient / Family Education for ongoing care X- 1 10 Staff obtains Programmer, systems, Records, T Results / Process Orders est '[]'$  - 0 Staff telephones HHA, Nursing Homes / Clarify orders / etc '[]'$  - 0 Routine Transfer to another Facility (non-emergent condition) X- 1 10 Routine Hospital Admission (non-emergent condition) '[]'$  - 0 New Admissions / Biomedical engineer / Ordering NPWT Apligraf, etc. , '[]'$  - 0 Emergency Hospital Admission (emergent condition) X- 1 10 Simple Discharge Coordination '[]'$  - 0 Complex (extensive) Discharge Coordination PROCESS - Special Needs '[]'$  - 0 Pediatric / Minor Patient Management '[]'$  - 0 Isolation Patient Management '[]'$  - 0 Hearing / Language / Visual special needs '[]'$  - 0 Assessment of Community assistance (transportation, D/C planning, etc.) '[]'$  - 0 Additional assistance / Altered mentation '[]'$  - 0 Support Surface(s) Assessment (bed, cushion, seat, etc.) INTERVENTIONS - Wound Cleansing / Measurement '[]'$  - 0 Simple Wound Cleansing - one wound X- 1 5 Complex Wound Cleansing - multiple wounds X- 1 5 Wound Imaging (photographs - any number of wounds) '[]'$  - 0 Wound Tracing (instead of photographs) '[]'$  - 0 Simple Wound Measurement - one wound X- 1 5 Complex  Wound Measurement - multiple wounds INTERVENTIONS - Wound Dressings '[]'$  - 0 Small Wound Dressing one or multiple wounds X- 1 15 Medium Wound  Dressing one or multiple wounds '[]'$  - 0 Large Wound Dressing one or multiple wounds '[]'$  - 0 Application of Medications - topical '[]'$  - 0 Application of Medications - injection INTERVENTIONS - Miscellaneous '[]'$  - 0 External ear exam '[]'$  - 0 Specimen Collection (cultures, biopsies, blood, body fluids, etc.) '[]'$  - 0 Specimen(s) / Culture(s) sent or taken to Lab for analysis [name], [name] ([id number]) [id number] Page 3 of 10 '[]'$  - 0 Patient Transfer (multiple staff / Civil Service fast streamer / Similar devices) '[]'$  - 0 Simple Staple / Suture removal (25 or less) '[]'$  - 0 Complex Staple / Suture removal (26 or more) '[]'$  - 0 Hypo / Hyperglycemic Management (close monitor of Blood Glucose) '[]'$  - 0 Ankle / Brachial Index (ABI) - do not check if billed separately X- 1 5 Vital Signs Has the patient been seen at the hospital within the last three years: Yes Total Score: 105 Level Of Care: New/Established - Level 3 Electronic Signature(s) Signed: 03/28/2022 6:12:13 PM By: Gretta Cool, BSN, RN, CWS, Kim RN, BSN Entered By: Gretta Cool, BSN, RN, CWS, Kim on 03/27/2022 11:23:17 -------------------------------------------------------------------------------- Encounter Discharge Information Details Patient Name: Date of Service: [name], Christopher IN Good. 03/27/2022 9:45 A M Medical Record Number: [id number] Patient Account Number: [id number] Date of Birth/Sex: Treating RN: 1928-04-26 (89 y.o. [name] Primary Care Marianela Mandrell: Harrel Lemon Other Clinician: Referring Trentin Knappenberger: Treating Zaleah Ternes/Extender: Caffie Damme in Treatment: 0 Encounter Discharge Information Items Discharge Condition: Stable Ambulatory Status: Wheelchair Discharge Destination: Home Transportation: Private Auto Accompanied By: daughter Schedule Follow-up  Appointment: Yes Clinical Summary of Care: Notes Patient going to ED for evaluation and treatment of blood loss, patient weakness and use of Eliquis. Electronic Signature(s) Signed: 03/28/2022 6:12:13 PM By: Gretta Cool, BSN, RN, CWS, Kim RN, BSN Entered By: Gretta Cool, BSN, RN, CWS, Kim on 03/27/2022 11:39:04 -------------------------------------------------------------------------------- Lower Extremity Assessment Details Patient Name: Date of Service: [name], Christopher IN Good. 03/27/2022 9:45 A Benita Stabile, Ocie Cornfield ([id number] Page 4 of 10 Medical Record Number: [id number] Patient Account Number: [id number] Date of Birth/Sex: Treating RN: Feb 10, 1928 (89 y.o. [name] Primary Care Creighton Longley: Harrel Lemon Other Clinician: Referring Aashka Salomone: Treating Bernette Seeman/Extender: Caffie Damme in Treatment: 0 Electronic Signature(s) Signed: 03/28/2022 6:12:13 PM By: Gretta Cool BSN, RN, CWS, Kim RN, BSN Entered By: Gretta Cool, BSN, RN, CWS, Kim on 03/27/2022 10:18:16 -------------------------------------------------------------------------------- Multi Wound Chart Details Patient Name: Date of Service: [name], Christopher IN Good. 03/27/2022 9:45 A M Medical Record Number: [id number] Patient Account Number: [id number] Date of Birth/Sex: Treating RN: 12-Aug-1928 (89 y.o. [name] Primary Care Melrose Kearse: Harrel Lemon Other Clinician: Referring Geary Rufo: Treating Terrill Wauters/Extender: Caffie Damme in Treatment: 0 Vital Signs Height(in): 18 Pulse(bpm): 23 Weight(lbs): 138 Blood Pressure(mmHg): 125/57 Body Mass Index(BMI): 20.4 Temperature(F): 97.9 Respiratory Rate(breaths/min): 20 [1:Photos:] [N/A:N/A] Right Upper Arm N/A N/A Wound Location: Trauma N/A N/A Wounding Event: Skin Tear N/A N/A Primary Etiology: Chronic Obstructive Pulmonary N/A N/A Comorbid History: Disease (COPD), Arrhythmia, Congestive Heart Failure,  Coronary Artery Disease, Hypertension, Peripheral Arterial Disease 03/20/2022 N/A N/A Date Acquired: 0 N/A N/A Weeks of Treatment: Open N/A N/A Wound Status: No N/A N/A Wound Recurrence: 4x5.5x0.2 N/A N/A Measurements Good x W x D (cm) 17.279 N/A N/A A (cm) : rea 3.456 N/A N/A Volume (cm) : 0.00% N/A N/A % Reduction in A rea: -100.00% N/A N/A % Reduction in Volume: Partial Thickness N/A N/A Classification: Large N/A N/A Exudate A mount: Sanguinous N/A N/A Exudate Type: red N/A  N/A Exudate Color: Distinct, outline attached N/A N/A Wound Margin: None Present (0%) N/A N/A Granulation A mount: None Present (0%) N/A N/A Necrotic A mount: Fascia: No N/A N/A Exposed Structures: Fat Layer (Subcutaneous Tissue): No Orvin, Ratte Ridley Good ([id number] Page 5 of 10 Tendon: No Muscle: No Joint: No Bone: No Limited to Skin Breakdown None N/A N/A Epithelialization: Treatment Notes Electronic Signature(s) Signed: 03/28/2022 6:12:13 PM By: Gretta Cool, BSN, RN, CWS, Kim RN, BSN Entered By: Gretta Cool, BSN, RN, CWS, Kim on 03/27/2022 10:38:59 -------------------------------------------------------------------------------- Multi-Disciplinary Care Plan Details Patient Name: Date of Service: [name], Christopher IN Good. 03/27/2022 9:45 A M Medical Record Number: [id number] Patient Account Number: [id number] Date of Birth/Sex: Treating RN: 06/11/1928 (89 y.o. [name], [name] Primary Care Haru [name]: Harrel Lemon Other Clinician: Referring Jaquala Fuller: Treating Raihana Balderrama/Extender: Caffie Damme in Treatment: 0 Active Inactive Abuse / Safety / Falls / Self Care Management Nursing Diagnoses: Potential for falls Goals: Patient will remain injury free related to falls Date Initiated: 03/27/2022 Target Resolution Date: 03/27/2022 Goal Status: Active Patient/caregiver will verbalize understanding of skin care regimen Date Initiated:  03/27/2022 Target Resolution Date: 03/27/2022 Goal Status: Active Patient/caregiver will verbalize/demonstrate measure taken to improve self care Date Initiated: 03/27/2022 Target Resolution Date: 03/27/2022 Goal Status: Active Patient/caregiver will verbalize/demonstrate measures taken to improve the patient's personal safety Date Initiated: 03/27/2022 Target Resolution Date: 03/27/2022 Goal Status: Active Interventions: Podiatry chair, stretcher in low position and side rails up as needed Notes: Necrotic Tissue Nursing Diagnoses: Impaired tissue integrity related to necrotic/devitalized tissue Knowledge deficit related to management of necrotic/devitalized tissue Goals: Necrotic/devitalized tissue will be minimized in the wound bed Date Initiated: 03/27/2022 Target Resolution Date: 03/27/2022 Goal Status: Active Patient/caregiver will verbalize understanding of reason and process for debridement of necrotic tissue Date Initiated: 03/27/2022 Target Resolution Date: 03/27/2022 [name], [name] ([id number]) [id number] Page 6 of 10 Goal Status: Active Interventions: Assess patient pain level pre-, during and post procedure and prior to discharge Provide education on necrotic tissue and debridement process Treatment Activities: Apply topical anesthetic as ordered : 03/27/2022 Notes: Orientation to the Wound Care Program Nursing Diagnoses: Knowledge deficit related to the wound healing center program Goals: Patient/caregiver will verbalize understanding of the [facility] Date Initiated: 03/27/2022 Target Resolution Date: 03/27/2022 Goal Status: Active Interventions: Provide education on orientation to the wound center Notes: Wound/Skin Impairment Nursing Diagnoses: Impaired tissue integrity Goals: Patient/caregiver will verbalize understanding of skin care regimen Date Initiated: 03/27/2022 Target Resolution Date: 03/28/2022 Goal Status:  Active Ulcer/skin breakdown will have a volume reduction of 30% by week 4 Date Initiated: 03/27/2022 Target Resolution Date: 04/24/2022 Goal Status: Active Ulcer/skin breakdown will have a volume reduction of 50% by week 8 Date Initiated: 03/27/2022 Target Resolution Date: 05/22/2022 Goal Status: Active Ulcer/skin breakdown will have a volume reduction of 80% by week 12 Date Initiated: 03/27/2022 Target Resolution Date: 06/19/2022 Goal Status: Active Ulcer/skin breakdown will heal within 14 weeks Date Initiated: 03/27/2022 Target Resolution Date: 07/03/2022 Goal Status: Active Interventions: Assess patient/caregiver ability to obtain necessary supplies Assess patient/caregiver ability to perform ulcer/skin care regimen upon admission and as needed Assess ulceration(s) every visit Treatment Activities: Referred to DME Khair Chasteen for dressing supplies : 03/27/2022 Topical wound management initiated : 03/27/2022 Notes: Electronic Signature(s) Signed: 03/28/2022 6:12:13 PM By: Gretta Cool, BSN, RN, CWS, Kim RN, BSN Entered By: Gretta Cool, BSN, RN, CWS, Kim on 03/27/2022 11:35:47 [name] ([id number] Page 7 of 10 -------------------------------------------------------------------------------- Pain Assessment Details Patient Name: Date of Service: Christopher  Good, Christopher IN Good. 03/27/2022 9:45 A M Medical Record Number: [id number] Patient Account Number: [id number] Date of Birth/Sex: Treating RN: 03/25/1928 (89 y.o. [name] Primary Care Pepper Kerrick: Harrel Lemon Other Clinician: Referring Shanyah Gattuso: Treating Jeorgia Helming/Extender: Caffie Damme in Treatment: 0 Active Problems Location of Pain Severity and Description of Pain Patient Has Paino Yes Site Locations Pain Location: Pain in Ulcers Character of Pain Describe the Pain: Tender Pain Management and Medication Current Pain Management: Electronic Signature(s) Signed: 03/28/2022  6:12:13 PM By: Gretta Cool, BSN, RN, CWS, Kim RN, BSN Entered By: Gretta Cool, BSN, RN, CWS, Kim on 03/27/2022 10:16:19 -------------------------------------------------------------------------------- Patient/Caregiver Education Details Patient Name: Date of Service: [name], Christopher IN Good. 2/28/2024andnbsp9:45 A M Medical Record Number: [id number] Patient Account Number: [id number] Date of Birth/Gender: Treating RN: 07-01-1928 (89 y.o. [name] Primary Care Physician: Harrel Lemon Other Clinician: Referring Physician: Treating Physician/Extender: Caffie Damme in Treatment: 0 Education Assessment Education Provided To: Patient Education Topics Provided Wound/Skin ImpairmentHAMDAN, BANTER ([id number]) [id number] Page 8 of 10 Handouts: Caring for Your Ulcer, Other: continue wound care as prescribed. If bleeding continues go to ED. Methods: Demonstration, Explain/Verbal Responses: State content correctly Electronic Signature(s) Signed: 03/28/2022 6:12:13 PM By: Gretta Cool, BSN, RN, CWS, Kim RN, BSN Entered By: Gretta Cool, BSN, RN, CWS, Kim on 03/27/2022 11:36:27 -------------------------------------------------------------------------------- Wound Assessment Details Patient Name: Date of Service: [name], Christopher IN Good. 03/27/2022 9:45 A M Medical Record Number: [id number] Patient Account Number: [id number] Date of Birth/Sex: Treating RN: 14-Dec-1928 (89 y.o. [name] Primary Care [name]: Harrel Lemon Other Clinician: Referring Spence Soberano: Treating Jalayiah Bibian/Extender: Caffie Damme in Treatment: 0 Wound Status Wound Number: 1 Primary Etiology: Skin Tear Wound Location: Right Upper Arm Wound Status: Open Wounding Event: Trauma Date Acquired: 03/20/2022 Weeks Of Treatment: 0 Clustered Wound: No Photos Wound Measurements Length: (cm) 4 Width: (cm) 5.5 Depth: (cm) 0.2 Area: (cm) 17.279 Volume: (cm) 3.456 %  Reduction in Area: 0% % Reduction in Volume: -100% Epithelialization: None Tunneling: No Undermining: No Wound Description Classification: Partial Thickness Wound Margin: Distinct, outline attached Exudate Amount: Large Exudate Type: Sanguinous Exudate Color: red Foul Odor After Cleansing: No Slough/Fibrino No Wound Bed Granulation Amount: None Present (0%) Exposed Structure Necrotic Amount: None Present (0%) Fascia Exposed: No Fat Layer (Subcutaneous Tissue) Exposed: No Tendon Exposed: No [name], [name] ([id number] Page 9 of 10 Muscle Exposed: No Joint Exposed: No Bone Exposed: No Limited to Skin Breakdown Treatment Notes Wound #1 (Upper Arm) Wound Laterality: Right Cleanser Normal Saline Discharge Instruction: Wash your hands with soap and water. Remove old dressing, discard into plastic bag and place into trash. Cleanse the wound with Normal Saline prior to applying a clean dressing using gauze sponges, not tissues or cotton balls. Do not scrub or use excessive force. Pat dry using gauze sponges, not tissue or cotton balls. Peri-Wound Care Topical Primary Dressing Zetuvit Plus 4x8 (in/in) Discharge Instruction: Needed for excess bleeding/drainage Surgifoam Secondary Dressing Kerlix 4.5 x 4.1 (in/yd) Discharge Instruction: Apply Kerlix 4.5 x 4.1 (in/yd) as instructed Secured With Compression Wrap Compression Stockings Environmental education officer) Signed: 03/28/2022 6:12:13 PM By: Gretta Cool, BSN, RN, CWS, Kim RN, BSN Entered By: Gretta Cool, BSN, RN, CWS, Kim on 03/27/2022 11:42:22 -------------------------------------------------------------------------------- [name] Details Patient Name: Date of Service: [name], Christopher IN Good. 03/27/2022 9:45 A M Medical Record Number: [id number] Patient Account Number: [id number] Date of Birth/Sex: Treating RN: 07-Apr-1928 (89 y.o. [name] Primary Care [name]: Harrel Lemon Other  Clinician: Referring [name]: Treating [name]/Extender: Caffie Damme in Treatment: 0 Vital Signs Time Taken: 10:22 Temperature (F): 97.9 Height (in): 69 Pulse (bpm): 89 Weight (lbs): 138 Respiratory Rate (breaths/min): 20 Body Mass Index (BMI): 20.4 Blood Pressure (mmHg): 125/57 Reference Range: 80 - 120 mg / dl Airway Inhaled Oxygen Concentration (%): 3 Electronic Signature(s) Signed: 03/28/2022 6:12:13 PM By: Gretta Cool, BSN, RN, CWS, Kim RN, BSN [place], Margues Good ([id number]) [id number] Page 10 of 10 Entered By: Gretta Cool, BSN, RN, CWS, Kim on 03/27/2022 10:23:33

## 2022-03-31 NOTE — Progress Notes (Signed)
Christopher, ZIENTEK ([id number]) [id number] Page 1 of 9 Visit Report for 03/26/2022 Chief Complaint Document Details Patient Name: Date of Service: [name], [name] 03/26/2022 12:45 PM Medical Record Number: [id number] Patient Account Number: [id number] Date of Birth/Sex: Treating RN: [birth year] (89 y.o. [name] Primary Care Provider: Harrel Lemon Other Clinician: Referring Provider: Treating Provider/Extender: Sheryle Spray in Treatment: 0 Information Obtained from: Patient Chief Complaint Right arm skin tear Electronic Signature(s) Signed: 03/26/2022 2:22:25 PM By: Worthy Keeler PA-C Entered By: Worthy Keeler on 03/26/2022 14:22:25 -------------------------------------------------------------------------------- Debridement Details Patient Name: Date of Service: [name], [name] 03/26/2022 12:45 PM Medical Record Number: [id number] Patient Account Number: [id number] Date of Birth/Sex: Treating RN: 04/22/1928 (89 y.o. [name], [place] Primary Care Provider: Harrel Lemon Other Clinician: Referring Provider: Treating Provider/Extender: Sheryle Spray in Treatment: 0 Debridement Performed for Assessment: Wound #1 Right Upper Arm Performed By: Physician Tommie Sams., PA-C Debridement Type: Debridement Level of Consciousness (Pre-procedure): Awake and Alert Pre-procedure Verification/Time Out Yes - 14:15 Taken: Pain Control: Lidocaine 4% T opical Solution T Area Debrided (L x W): otal 2 (cm) x 0.3 (cm) = 0.6 (cm) Tissue and other material debrided: Non-Viable, Skin: Dermis , Skin: Epidermis Level: Skin/Epidermis Debridement Description: Selective/Open Wound Instrument: Forceps, Scissors Bleeding: Moderate Hemostasis Achieved: Silver Nitrate Response to Treatment: Procedure was tolerated well Level of Consciousness (Post- Awake and Alert procedure): Post Debridement Measurements of Total  Wound MARO, RIDGEL L ([id number]) [id number] Page 2 of 9 Length: (cm) 2 Width: (cm) 0.3 Depth: (cm) 0.1 Volume: (cm) 0.047 Character of Wound/Ulcer Post Debridement: Improved Post Procedure Diagnosis Same as Pre-procedure Notes nonvascular skin flap removed Electronic Signature(s) Signed: 03/28/2022 9:52:22 AM By: Lawanda Cousins Signed: 03/28/2022 5:15:32 PM By: Worthy Keeler PA-C Entered By: Lawanda Cousins on 03/26/2022 15:09:05 -------------------------------------------------------------------------------- HPI Details Patient Name: Date of Service: STA [name], [name] 03/26/2022 12:45 PM Medical Record Number: [id number] Patient Account Number: [id number] Date of Birth/Sex: Treating RN: 01/22/1929 (89 y.o. [name] Primary Care Provider: Harrel Lemon Other Clinician: Referring Provider: Treating Provider/Extender: Sheryle Spray in Treatment: 0 History of Present Illness HPI Description: 03-26-2022 upon evaluation today patient presents for a significant skin tear to his right elbow location. He actually has a portion of the skin that got pulled back this happened sometime when he was in the ICU unfortunately. With that being said he tells me that upon discharge and his daughter is with him today that he was rolled down to the car and he had a sheet wrapped around his arm along with bandages that were bloodsoaked and he was bleeding and they were told that they needed to follow-up with a primary care regarding skin tear because they "did not know what to do for the skin tear in the ICU". Nonetheless they attempted to get in touch with the primary care who actually referred the patient to [place] here at wound care. He was discharged on the 20th of the month that is February 2024 and we are seeing in wound the 26 March 2022. This means that the dressing in place today has been in place for a week. It was severely stuck to the  wound bed and of course had to come off. This did cause bleeding and when I came into the room to evaluate him he had quite a bit Of significant bleeding unfortunately. With that being said I am going to try to see  what I can do to get this stopped as much as possible. Patient does have a history of chronic peripheral vascular disease, he is on long-term anticoagulant therapy, and has COPD. Electronic Signature(s) Signed: 03/27/2022 3:29:50 PM By: Worthy Keeler PA-C Entered By: Worthy Keeler on 03/27/2022 15:29:50 -------------------------------------------------------------------------------- Physical Exam Details Patient Name: Date of Service: [name], [name] 03/26/2022 12:45 PM [name] ([id number]) [id number] Page 3 of 9 Medical Record Number: [id number] Patient Account Number: [id number] Date of Birth/Sex: Treating RN: 1929/01/19 (89 y.o. [name] Primary Care Provider: Harrel Lemon Other Clinician: Referring Provider: Treating Provider/Extender: Sheryle Spray in Treatment: 0 Constitutional sitting or standing blood pressure is within target range for patient.. pulse regular and within target range for patient.Marland Kitchen respirations regular, non-labored and within target range for patient.Marland Kitchen temperature within target range for patient.. Well-nourished and well-hydrated in no acute distress. Eyes conjunctiva clear no eyelid edema noted. pupils equal round and reactive to light and accommodation. Ears, Nose, Mouth, and Throat no gross abnormality of ear auricles or external auditory canals. normal hearing noted during conversation. mucus membranes moist. Respiratory normal breathing without difficulty. Cardiovascular 2+ dorsalis pedis/posterior tibialis pulses. no clubbing, cyanosis, significant edema, <3 sec cap refill. Psychiatric this patient is able to make decisions and demonstrates good insight into disease process.  Alert and Oriented x 3. pleasant and cooperative. Notes Upon inspection patient's elbow actually did have a bit of skin along the proximal portion of the wound which was completely not adhered and again I did carefully trim this off. He did not make anything bleed any more significantly than what it was previous. With that being said it did show some signs of unfortunately continued bleeding throughout the wound in general and though there was a lot of necrotic tissue there was no way that I am going to be able to remove that today due to the amount of bleeding, try to just get things stopped as far as hemostasis is concerned. I did perform chemical cauterization with silver nitrate over the area as a whole in order to try to stop this bleeding. I felt like it was pretty well achieved as far as hemostasis was concerned there were a couple areas that were leaking we did put her somewhat pressure dressing on to try to help out in this regard. Electronic Signature(s) Signed: 03/27/2022 3:30:48 PM By: Worthy Keeler PA-C Entered By: Worthy Keeler on 03/27/2022 15:30:48 -------------------------------------------------------------------------------- Physician Orders Details Patient Name: Date of Service: [name], [name] 03/26/2022 12:45 PM Medical Record Number: [id number] Patient Account Number: [id number] Date of Birth/Sex: Treating RN: [birth year] (89 y.o. [name], [place] Primary Care Provider: Harrel Lemon Other Clinician: Referring Provider: Treating Provider/Extender: Sheryle Spray in Treatment: 0 Verbal / Phone Orders: No Diagnosis Coding ICD-10 Coding Code Description [id number] Laceration without foreign body of right upper arm, initial encounter Z79.01 Long term (current) use of anticoagulants I73.89 Other specified peripheral vascular diseases J44.9 Chronic obstructive pulmonary disease, unspecified Follow-up Appointments Return Appointment in 1  week. Wound Treatment RAHMAN, AGOSTA ([id number]) [id number] Page 4 of 9 Wound #1 - Upper Arm Wound Laterality: Right Cleanser: Normal Saline 1 x Per Day/30 Days Discharge Instructions: Wash your hands with soap and water. Remove old dressing, discard into plastic bag and place into trash. Cleanse the wound with Normal Saline prior to applying a clean dressing using gauze sponges, not tissues or cotton balls. Do not scrub or use  excessive force. Pat dry using gauze sponges, not tissue or cotton balls. Prim Dressing: Xeroform 4x4-HBD (in/in) 1 x Per Day/30 Days ary Discharge Instructions: Apply Xeroform 4x4-HBD (in/in) as directed Secondary Dressing: ABD Pad 5x9 (in/in) 1 x Per Day/30 Days Discharge Instructions: Cover with ABD pad Secondary Dressing: Conforming Guaze Roll-Medium 1 x Per Day/30 Days Discharge Instructions: [place] as directed Electronic Signature(s) Signed: 03/28/2022 9:52:22 AM By: Lawanda Cousins Signed: 03/28/2022 5:15:32 PM By: Worthy Keeler PA-C Entered By: Lawanda Cousins on 03/26/2022 15:05:59 -------------------------------------------------------------------------------- Problem List Details Patient Name: Date of Service: STA [name], [name] 03/26/2022 12:45 PM Medical Record Number: [id number] Patient Account Number: [id number] Date of Birth/Sex: Treating RN: Jul 03, 1928 (89 y.o. Isac Sarna, Maudie [place] Primary Care Provider: Harrel Lemon Other Clinician: Referring Provider: Treating Provider/Extender: Sheryle Spray in Treatment: 0 Active Problems ICD-10 Encounter Code Description Active Date MDM Diagnosis S41.111A Laceration without foreign body of right upper arm, initial encounter 03/26/2022 No Yes Z79.01 Long term (current) use of anticoagulants 03/26/2022 No Yes I73.89 Other specified peripheral vascular diseases 03/26/2022 No Yes J44.9 Chronic obstructive pulmonary disease,  unspecified 03/26/2022 No Yes Inactive Problems Resolved Problems Electronic Signature(s) Signed: 03/26/2022 2:22:08 PM By: Worthy Keeler PA-C Entered By: Worthy Keeler on 03/26/2022 14:22:07 [name] ([id number]) [id number] Page 5 of 9 -------------------------------------------------------------------------------- Progress Note Details Patient Name: Date of Service: [name], [name] 03/26/2022 12:45 PM Medical Record Number: [id number] Patient Account Number: [id number] Date of Birth/Sex: Treating RN: 17-Dec-1928 (89 y.o. [name] Primary Care Provider: Harrel Lemon Other Clinician: Referring Provider: Treating Provider/Extender: Sheryle Spray in Treatment: 0 Subjective Chief Complaint Information obtained from Patient Right arm skin tear History of Present Illness (HPI) 03-26-2022 upon evaluation today patient presents for a significant skin tear to his right elbow location. He actually has a portion of the skin that got pulled back this happened sometime when he was in the ICU unfortunately. With that being said he tells me that upon discharge and his daughter is with him today that he was rolled down to the car and he had a sheet wrapped around his arm along with bandages that were bloodsoaked and he was bleeding and they were told that they needed to follow-up with a primary care regarding skin tear because they "did not know what to do for the skin tear in the ICU". Nonetheless they attempted to get in touch with the primary care who actually referred the patient to [place] here at wound care. He was discharged on the 20th of the month that is February 2024 and we are seeing in wound the 26 March 2022. This means that the dressing in place today has been in place for a week. It was severely stuck to the wound bed and of course had to come off. This did cause bleeding and when I came into the room to evaluate him he  had quite a bit Of significant bleeding unfortunately. With that being said I am going to try to see what I can do to get this stopped as much as possible. Patient does have a history of chronic peripheral vascular disease, he is on long-term anticoagulant therapy, and has COPD. Patient History Information obtained from Patient, Chart. Allergies bee venom protein (honey bee) (Severity: Severe, Reaction: anaphylaxis) Social History Former smoker. Medical History Respiratory Patient has history of Chronic Obstructive Pulmonary Disease (COPD) - o2 dependent Cardiovascular Patient has history of Arrhythmia -  paroxysmal atrial fibrillation, Congestive Heart Failure, Coronary Artery Disease, Hypertension, Peripheral Arterial Disease - aortobifemoral bypass Hospitalization/Surgery History - carotid stent 03/18/2022. - RIGHT femoral endarterectomy 07/2020. - renal bypass 2005. - eye surgery 2005. Medical A Surgical History Notes nd Constitutional Symptoms (General Health) protein calorie malnutrition Respiratory covid chronic respiratory failure aspiration pneumonia Cardiovascular murmur hyperlipidemia thoracic aortic ectasia carotid stenosis Oncologic basal cell carcinoma Review of Systems (ROS) Musculoskeletal Denies complaints or symptoms of Muscle Pain, Muscle Weakness. Objective IRBIN, KURMAN ([id number]) [id number] Page 6 of 9 Constitutional sitting or standing blood pressure is within target range for patient.. pulse regular and within target range for patient.Marland Kitchen respirations regular, non-labored and within target range for patient.Marland Kitchen temperature within target range for patient.. Well-nourished and well-hydrated in no acute distress. Vitals Time Taken: 1:11 PM, Height: 69 in, Source: Stated, Weight: 138 lbs, Source: Stated, BMI: 20.4, Temperature: 98.4 F, Pulse: 75 bpm, Respiratory Rate: 22 breaths/min, Blood Pressure: 132/72 mmHg. Eyes conjunctiva  clear no eyelid edema noted. pupils equal round and reactive to light and accommodation. Ears, Nose, Mouth, and Throat no gross abnormality of ear auricles or external auditory canals. normal hearing noted during conversation. mucus membranes moist. Respiratory normal breathing without difficulty. Cardiovascular 2+ dorsalis pedis/posterior tibialis pulses. no clubbing, cyanosis, significant edema, Psychiatric this patient is able to make decisions and demonstrates good insight into disease process. Alert and Oriented x 3. pleasant and cooperative. General Notes: Upon inspection patient's elbow actually did have a bit of skin along the proximal portion of the wound which was completely not adhered and again I did carefully trim this off. He did not make anything bleed any more significantly than what it was previous. With that being said it did show some signs of unfortunately continued bleeding throughout the wound in general and though there was a lot of necrotic tissue there was no way that I am going to be able to remove that today due to the amount of bleeding, try to just get things stopped as far as hemostasis is concerned. I did perform chemical cauterization with silver nitrate over the area as a whole in order to try to stop this bleeding. I felt like it was pretty well achieved as far as hemostasis was concerned there were a couple areas that were leaking we did put her somewhat pressure dressing on to try to help out in this regard. Integumentary (Hair, Skin) Wound #1 status is Open. Original cause of wound was Trauma. The date acquired was: 03/20/2022. The wound is located on the Right Upper Arm. The wound measures 4cm length x 5.5cm width x 0.1cm depth; 17.279cm^2 area and 1.728cm^3 volume. The wound is limited to skin breakdown. There is no tunneling or undermining noted. There is a large amount of sanguinous drainage noted. The wound margin is distinct with the outline attached to the  wound base. There is no granulation within the wound bed. There is no necrotic tissue within the wound bed. Assessment Active Problems ICD-10 Laceration without foreign body of right upper arm, initial encounter Long term (current) use of anticoagulants Other specified peripheral vascular diseases Chronic obstructive pulmonary disease, unspecified Procedures Wound #1 Pre-procedure diagnosis of Wound #1 is a Skin T located on the Right Upper Arm . There was a Selective/Open Wound Skin/Epidermis Debridement with a ear total area of 0.6 sq cm performed by Tommie Sams., PA-C. With the following instrument(s): Forceps, and Scissors to remove Non-Viable tissue/material. Material removed includes Skin: Dermis and Skin: Epidermis and after  achieving pain control using Lidocaine 4% T opical Solution. No specimens were taken. A time out was conducted at 14:15, prior to the start of the procedure. A Moderate amount of bleeding was controlled with Silver Nitrate. The procedure was tolerated well. Post Debridement Measurements: 2cm length x 0.3cm width x 0.1cm depth; 0.047cm^3 volume. Character of Wound/Ulcer Post Debridement is improved. Post procedure Diagnosis Wound #1: Same as Pre-Procedure General Notes: nonvascular skin flap removed. Plan Follow-up Appointments: Return Appointment in 1 week. WOUND #1: - Upper Arm Wound Laterality: Right Cleanser: Normal Saline 1 x Per Day/30 Days Discharge Instructions: Wash your hands with soap and water. Remove old dressing, discard into plastic bag and place into trash. Cleanse the wound with Normal Saline prior to applying a clean dressing using gauze sponges, not tissues or cotton balls. Do not scrub or use excessive force. Pat dry using gauze sponges, not tissue or cotton balls. Prim Dressing: Xeroform 4x4-HBD (in/in) 1 x Per Day/30 Days ary Discharge Instructions: Apply Xeroform 4x4-HBD (in/in) as directed Secondary Dressing: ABD Pad 5x9 (in/in) 1 x  Per Day/30 Days Discharge Instructions: Cover with ABD pad Secondary Dressing: Conforming Guaze Roll-Medium 1 x Per Day/30 Days Discharge Instructions: Apply Conforming Stretch Guaze Bandage as directed DAVINDER, MIGNEAULT ([id number]) [id number] Page 7 of 9 1. Based on what I am seeing I do believe that the patient is going require debridement down the road but right now this is just impossible. Actually did go ahead and utilize Xeroform gauze to try to prevent this from sticking and therefore this will need to be changed daily as well. 2. I am good recommend as well that the patient should continue to monitor for any signs of infection or worsening or increased bleeding if anything changes they should contact the office and let us know. We will see patient back for reevaluation in 1 week here in the clinic. If anything worsens or changes patient will contact our office for additional recommendations. Electronic Signature(s) Signed: 03/27/2022 3:31:16 PM By: Worthy Keeler PA-C Entered By: Worthy Keeler on 03/27/2022 15:31:16 -------------------------------------------------------------------------------- ROS/PFSH Details Patient Name: Date of Service: [name], [name] 03/26/2022 12:45 PM Medical Record Number: [id number] Patient Account Number: [id number] Date of Birth/Sex: Treating RN: Apr 08, 1928 (89 y.o. [name], [place] Primary Care Provider: Harrel Lemon Other Clinician: Referring Provider: Treating Provider/Extender: Sheryle Spray in Treatment: 0 Information Obtained From Patient Chart Musculoskeletal Complaints and Symptoms: Negative for: Muscle Pain; Muscle Weakness Constitutional Symptoms (General Health) Medical History: Past Medical History Notes: protein calorie malnutrition Respiratory Medical History: Positive for: Chronic Obstructive Pulmonary Disease (COPD) - o2 dependent Past Medical History  Notes: covid chronic respiratory failure aspiration pneumonia Cardiovascular Medical History: Positive for: Arrhythmia - paroxysmal atrial fibrillation; Congestive Heart Failure; Coronary Artery Disease; Hypertension; Peripheral Arterial Disease - aortobifemoral bypass Past Medical History Notes: murmur hyperlipidemia thoracic aortic ectasia carotid stenosis Oncologic Medical History: Past Medical History Notes: basal cell carcinoma Immunizations THATCHER, LOCKAMY ([id number]) [id number] Page 8 of 9 Pneumococcal Vaccine: Received Pneumococcal Vaccination: Yes Received Pneumococcal Vaccination On or After 60th Birthday: Yes Tetanus Vaccine: Last tetanus shot: 02/27/2021 Implantable Devices No devices added Hospitalization / Surgery History Type of Hospitalization/Surgery carotid stent 03/18/2022 RIGHT femoral endarterectomy 07/2020 renal bypass 2005 eye surgery 2005 Family and Social History Former smoker Engineer, maintenance) Signed: 03/28/2022 9:52:22 AM By: Lawanda Cousins Signed: 03/28/2022 5:15:32 PM By: Worthy Keeler PA-C Previous Signature: 03/26/2022 12:31:49 PM Version By: Lawanda Cousins Entered By: Lawanda Cousins  on 03/26/2022 13:48:30 -------------------------------------------------------------------------------- SuperBill Details Patient Name: Date of Service: [name], [name] 03/26/2022 Medical Record Number: [id number] Patient Account Number: [id number] Date of Birth/Sex: Treating RN: 04-02-1928 (89 y.o. [name] Primary Care Provider: Harrel Lemon Other Clinician: Referring Provider: Treating Provider/Extender: Sheryle Spray in Treatment: 0 Diagnosis Coding ICD-10 Codes Code Description [phone] Laceration without foreign body of right upper arm, initial encounter Z79.01 Long term (current) use of anticoagulants I73.89 Other specified peripheral vascular diseases J44.9 Chronic obstructive  pulmonary disease, unspecified Facility Procedures : CPT4 Code: [id number] Description: [id number] - WOUND CARE VISIT-LEV 3 EST PT Modifier: Quantity: 1 : CPT4 Code: [id number] Description: [id number] - DEBRIDE WOUND 1ST 20 SQ CM OR < ICD-10 Diagnosis Description [id number] Laceration without foreign body of right upper arm, initial encounter Modifier: Quantity: 1 Physician Procedures : CPT4 Code Description Modifier [id number] WC PHYS LEVEL 3 NEW PT 25 ICD-10 Diagnosis Description [id number] Laceration without foreign body of right upper arm, initial encounter Z79.01 Long term (current) use of anticoagulants Pyeatt, Rue L ([id number])  [id number] I73.89 Other specified peripheral vascular diseases J44.9 Chronic obstructive pulmonary disease, unspecified Quantity: 1 [id number] Page 9 of 9 : [id number] - WC PHYS DEBR WO ANESTH 20 SQ CM ICD-10 Diagnosis Description S41.111A Laceration without foreign body of right upper arm, initial encounter Quantity: 1 Electronic Signature(s) Signed: 03/27/2022 3:31:37 PM By: Worthy Keeler PA-C Entered By: Worthy Keeler on 03/27/2022 15:31:37

## 2022-04-01 ENCOUNTER — Encounter: Payer: Medicare Other | Attending: Internal Medicine | Admitting: Internal Medicine

## 2022-04-01 DIAGNOSIS — Z7901 Long term (current) use of anticoagulants: Secondary | ICD-10-CM | POA: Diagnosis not present

## 2022-04-01 DIAGNOSIS — S51802A Unspecified open wound of left forearm, initial encounter: Secondary | ICD-10-CM | POA: Diagnosis not present

## 2022-04-01 DIAGNOSIS — X58XXXA Exposure to other specified factors, initial encounter: Secondary | ICD-10-CM | POA: Insufficient documentation

## 2022-04-01 DIAGNOSIS — J449 Chronic obstructive pulmonary disease, unspecified: Secondary | ICD-10-CM | POA: Insufficient documentation

## 2022-04-01 DIAGNOSIS — I739 Peripheral vascular disease, unspecified: Secondary | ICD-10-CM | POA: Insufficient documentation

## 2022-04-01 DIAGNOSIS — Z7982 Long term (current) use of aspirin: Secondary | ICD-10-CM | POA: Diagnosis not present

## 2022-04-01 DIAGNOSIS — S41111A Laceration without foreign body of right upper arm, initial encounter: Secondary | ICD-10-CM | POA: Diagnosis present

## 2022-04-03 NOTE — Progress Notes (Signed)
[name], [name] ([id number]) [id number] Page 1 of 4 Visit Report for 03/26/2022 Abuse Risk Screen Details Patient Name: Date of Service: [name], Christopher IN Good. 03/26/2022 12:45 PM Medical Record Number: [id number] Patient Account Number: [id number] Date of Birth/Sex: Treating RN: 01/24/1929 (89 y.o. [name], [place] [facility] Esaias Cleavenger: Harrel Lemon Other Clinician: Referring Jessice Madill: Treating Lorette Peterkin/Extender: Sheryle Spray in Treatment: 0 Abuse Risk Screen Items Answer ABUSE RISK SCREEN: Has anyone close to you tried to hurt or harm you recentlyo No Do you feel uncomfortable with anyone in your familyo No Has anyone forced you do things that you didnt want to doo No Electronic Signature(s) Signed: 03/28/2022 9:52:22 AM By: Lawanda Cousins Entered By: Lawanda Cousins on 03/26/2022 13:48:40 -------------------------------------------------------------------------------- Activities of Daily Living Details Patient Name: Date of Service: [name], Christopher IN Good. 03/26/2022 12:45 PM Medical Record Number: [id number] Patient Account Number: [id number] Date of Birth/Sex: Treating RN: [birth year] (89 y.o. [name], [place] [facility] Dade Rodin: Harrel Lemon Other Clinician: Referring Isaac Dubie: Treating Abelino Tippin/Extender: Sheryle Spray in Treatment: 0 Activities of Daily Living Items Answer Activities of Daily Living (Please select one for each item) Drive Automobile Not Able T Medications ake Need Assistance Use T elephone Need Assistance Care for Appearance Need Assistance Use T oilet Need Assistance Bath / Shower Need Assistance Dress Self Need Assistance Feed Self Completely Able Walk Need Assistance Get In / Out Bed Need Assistance Housework Need Assistance SHIELDS, SLIFKO ([id number]) [phone] [id number] Page 2 of 4 Prepare Meals Need Assistance Handle Money Need  Assistance Shop for Self Need Assistance Electronic Signature(s) Signed: 03/28/2022 9:52:22 AM By: Lawanda Cousins Entered By: Lawanda Cousins on 03/26/2022 14:26:01 -------------------------------------------------------------------------------- Education Screening Details Patient Name: Date of Service: [name], Christopher IN Good. 03/26/2022 12:45 PM Medical Record Number: [id number] Patient Account Number: [id number] Date of Birth/Sex: Treating RN: Feb 05, 1928 (89 y.o. [name], [place] [facility] Kelina Beauchamp: Harrel Lemon Other Clinician: Referring Keilah Lemire: Treating Shalena Ezzell/Extender: Sheryle Spray in Treatment: 0 Learning Preferences/Education Level/Primary Language Learning Preference: Explanation Highest Education Level: High School Preferred Language: English Cognitive Barrier Language Barrier: No Translator Needed: No Memory Deficit: No Emotional Barrier: No Cultural/Religious Beliefs Affecting Medical Care: No Physical Barrier Impaired Vision: No Impaired Hearing: No Decreased Hand dexterity: No Knowledge/Comprehension Knowledge Level: High Comprehension Level: Medium Ability to understand written instructions: Medium Ability to understand verbal instructions: Medium Motivation Anxiety Level: Calm Cooperation: Cooperative Education Importance: Acknowledges Need Interest in Health Problems: Asks Questions Perception: Coherent Willingness to Engage in Self-Management Medium Activities: Readiness to Engage in Self-Management Medium Activities: Electronic Signature(s) Signed: 03/28/2022 9:52:22 AM By: Lawanda Cousins Entered By: Lawanda Cousins on 03/26/2022 14:27:57 [name] ([id number]) [id number] Page 3 of 4 -------------------------------------------------------------------------------- Fall Risk Assessment Details Patient Name: Date of Service: [name], Christopher IN Good. 03/26/2022 12:45 PM Medical Record Number:  [id number] Patient Account Number: [id number] Date of Birth/Sex: Treating RN: 04-Mar-1928 (89 y.o. [name], [place] [facility] Fatemah Pourciau: Harrel Lemon Other Clinician: Referring Tiyah Zelenak: Treating Japleen Tornow/Extender: Sheryle Spray in Treatment: 0 Fall Risk Assessment Items Have you had 2 or more falls in the last 12 monthso 0 No Have you had any fall that resulted in injury in the last 12 monthso 0 No FALLS RISK SCREEN History of falling - immediate or within 3 months 0 No Secondary diagnosis (Do you have 2 or more medical diagnoseso) 0 No Ambulatory aid None/bed rest/wheelchair/nurse 0 No Crutches/cane/walker 15 Yes Furniture 30 Yes  Intravenous therapy Access/Saline/Heparin Lock 0 No Gait/Transferring Normal/ bed rest/ wheelchair 0 No Weak (short steps with or without shuffle, stooped but able to lift head while walking, may seek 0 No support from furniture) Impaired (short steps with shuffle, may have difficulty arising from chair, head down, impaired 20 Yes balance) Mental Status Oriented to own ability 0 Yes Electronic Signature(s) Signed: 03/28/2022 9:52:22 AM By: Lawanda Cousins Entered By: Lawanda Cousins on 03/26/2022 14:29:09 -------------------------------------------------------------------------------- Nutrition Risk Screening Details Patient Name: Date of Service: [name], Christopher IN Good. 03/26/2022 12:45 PM Medical Record Number: [id number] Patient Account Number: [id number] Date of Birth/Sex: Treating RN: July 05, 1928 (89 y.o. [name], [place] [facility] Krystine Pabst: Harrel Lemon Other Clinician: Referring Tamzin Bertling: Treating Halima Fogal/Extender: Sheryle Spray in Treatment: 0 Height (in): 69 Weight (lbs): 138 Body Mass Index (BMI): 20.4 [name], [name] ([id number]) [phone] [id number] Page 4 of 4 Nutrition Risk Screening Items Score Screening NUTRITION RISK SCREEN: I have an illness or condition that  made me change the kind and/or amount of food I eat 2 Yes I eat fewer than two meals per day 0 No I eat few fruits and vegetables, or milk products 2 Yes I have three or more drinks of beer, liquor or wine almost every day 0 No I have tooth or mouth problems that make it hard for me to eat 0 No I don't always have enough money to buy the food I need 0 No I eat alone most of the time 0 No I take three or more different prescribed or over-the-counter drugs a day 1 Yes Without wanting to, I have lost or gained 10 pounds in the last six months 0 No I am not always physically able to shop, cook and/or feed myself 2 Yes Nutrition Protocols Good Risk Protocol Moderate Risk Protocol High Risk Proctocol Risk Level: High Risk Score: 7 Electronic Signature(s) Signed: 03/28/2022 9:52:22 AM By: Lawanda Cousins Entered By: Lawanda Cousins on 03/26/2022 14:29:35

## 2022-04-03 NOTE — Progress Notes (Signed)
LORREN, COMMANDER ([id number]) [id number] Page 1 of 9 Visit Report for 04/01/2022 Arrival Information Details Patient Name: Date of Service: [name], ERV IN Good. 04/01/2022 2:15 PM Medical Record Number: [id number] Patient Account Number: [id number] Date of Birth/Sex: Treating RN: 1928/04/14 (89 y.o. Seward Meth Primary Care Charmain Diosdado: Harrel Lemon Other Clinician: Referring Paige Monarrez: Treating Curtina Grills/Extender: RO BSO Delane Ginger, MICHA EL Francetta Found in Treatment: 0 Visit Information History Since Last Visit Added or deleted any medications: No Patient Arrived: Wheel Chair Has Dressing in Place as Prescribed: Yes Arrival Time: 14:55 Pain Present Now: No Accompanied By: daughter Transfer Assistance: None Patient Identification Verified: Yes Secondary Verification Process Completed: Yes Patient Has Alerts: Yes Patient Alerts: Patient on Blood Thinner '81mg'$  aspirin Eliquis NOT DIABETIC Electronic Signature(s) Signed: 04/01/2022 4:44:39 PM By: Rosalio Loud MSN RN CNS WTA Entered By: Rosalio Loud on 04/01/2022 16:44:39 -------------------------------------------------------------------------------- Clinic Level of Care Assessment Details Patient Name: Date of Service: [name]. 04/01/2022 2:15 PM Medical Record Number: [id number] Patient Account Number: [id number] Date of Birth/Sex: Treating RN: [birth year] (89 y.o. Seward Meth Primary Care Ladarrian Asencio: Harrel Lemon Other Clinician: Referring Pal Shell: Treating Mahari Vankirk/Extender: RO BSO N, [place] EL Percell Locus, John Weeks in Treatment: 0 Clinic Level of Care Assessment Items TOOL 4 Quantity Score X- 1 0 Use when only an EandM is performed on FOLLOW-UP visit ASSESSMENTS - Nursing Assessment / Reassessment X- 1 10 Reassessment of Co-morbidities (includes updates in patient status) X- 1 5 Reassessment of Adherence to Treatment Plan ASSESSMENTS - Wound and Skin A ssessment /  Reassessment X - Simple Wound Assessment / Reassessment - one wound 1 5 [name] ([id number]) [id number] Page 2 of 9 '[]'$  - 0 Complex Wound Assessment / Reassessment - multiple wounds '[]'$  - 0 Dermatologic / Skin Assessment (not related to wound area) ASSESSMENTS - Focused Assessment '[]'$  - 0 Circumferential Edema Measurements - multi extremities '[]'$  - 0 Nutritional Assessment / Counseling / Intervention '[]'$  - 0 Lower Extremity Assessment (monofilament, tuning fork, pulses) '[]'$  - 0 Peripheral Arterial Disease Assessment (using hand held doppler) ASSESSMENTS - Ostomy and/or Continence Assessment and Care '[]'$  - 0 Incontinence Assessment and Management '[]'$  - 0 Ostomy Care Assessment and Management (repouching, etc.) PROCESS - Coordination of Care X - Simple Patient / Family Education for ongoing care 1 15 '[]'$  - 0 Complex (extensive) Patient / Family Education for ongoing care X- 1 10 Staff obtains Programmer, systems, Records, T Results / Process Orders est '[]'$  - 0 Staff telephones HHA, Nursing Homes / Clarify orders / etc '[]'$  - 0 Routine Transfer to another Facility (non-emergent condition) '[]'$  - 0 Routine Hospital Admission (non-emergent condition) '[]'$  - 0 New Admissions / Biomedical engineer / Ordering NPWT Apligraf, etc. , '[]'$  - 0 Emergency Hospital Admission (emergent condition) '[]'$  - 0 Simple Discharge Coordination '[]'$  - 0 Complex (extensive) Discharge Coordination PROCESS - Special Needs '[]'$  - 0 Pediatric / Minor Patient Management '[]'$  - 0 Isolation Patient Management '[]'$  - 0 Hearing / Language / Visual special needs '[]'$  - 0 Assessment of Community assistance (transportation, D/C planning, etc.) '[]'$  - 0 Additional assistance / Altered mentation '[]'$  - 0 Support Surface(s) Assessment (bed, cushion, seat, etc.) INTERVENTIONS - Wound Cleansing / Measurement '[]'$  - 0 Simple Wound Cleansing - one wound '[]'$  - 0 Complex Wound Cleansing - multiple wounds '[]'$  -  0 Wound Imaging (photographs - any number of wounds) '[]'$  - 0 Wound Tracing (instead of photographs) X- 1 5 Simple Wound Measurement -  one wound '[]'$  - 0 Complex Wound Measurement - multiple wounds INTERVENTIONS - Wound Dressings '[]'$  - 0 Small Wound Dressing one or multiple wounds X- 1 15 Medium Wound Dressing one or multiple wounds '[]'$  - 0 Large Wound Dressing one or multiple wounds '[]'$  - 0 Application of Medications - topical '[]'$  - 0 Application of Medications - injection INTERVENTIONS - Miscellaneous '[]'$  - 0 External ear exam '[]'$  - 0 Specimen Collection (cultures, biopsies, blood, body fluids, etc.) [name] ([id number]) [id number] Page 3 of 9 '[]'$  - 0 Specimen(s) / Culture(s) sent or taken to Lab for analysis '[]'$  - 0 Patient Transfer (multiple staff / Harrel Lemon Lift / Similar devices) '[]'$  - 0 Simple Staple / Suture removal (25 or less) '[]'$  - 0 Complex Staple / Suture removal (26 or more) '[]'$  - 0 Hypo / Hyperglycemic Management (close monitor of Blood Glucose) '[]'$  - 0 Ankle / Brachial Index (ABI) - do not check if billed separately X- 1 5 Vital Signs Has the patient been seen at the hospital within the last three years: Yes Total Score: 70 Level Of Care: New/Established - Level 2 Electronic Signature(s) Signed: 04/01/2022 5:29:13 PM By: Rosalio Loud MSN RN CNS WTA Entered By: Rosalio Loud on 04/01/2022 16:46:48 -------------------------------------------------------------------------------- Encounter Discharge Information Details Patient Name: Date of Service: [name]. 04/01/2022 2:15 PM Medical Record Number: [id number] Patient Account Number: [id number] Date of Birth/Sex: Treating RN: 1928/06/08 (89 y.o. Seward Meth Primary Care [name]: Harrel Lemon Other Clinician: Referring Oceana Walthall: Treating Mckenzye Cutright/Extender: RO BSO N, [place] EL Francetta Found in Treatment: 0 Encounter Discharge Information Items Discharge  Condition: Stable Ambulatory Status: Wheelchair Discharge Destination: Home Transportation: Private Auto Accompanied By: daughter Schedule Follow-up Appointment: Yes Clinical Summary of Care: Electronic Signature(s) Signed: 04/01/2022 4:47:53 PM By: Rosalio Loud MSN RN CNS WTA Entered By: Rosalio Loud on 04/01/2022 16:47:53 -------------------------------------------------------------------------------- Lower Extremity Assessment Details Patient Name: Date of Service: [name]. 04/01/2022 2:15 PM Medical Record Number: [id number] Patient Account Number: [id number] Date of Birth/Sex: Treating RN: Oct 06, 1928 (89 y.o. [place], [place], [place] Carlean Jews ([id number]) [phone].pdf Page 4 of 9 Primary Care Sonnet Rizor: Harrel Lemon Other Clinician: Referring Sokha Craker: Treating Eve Rey/Extender: RO BSO Delane Ginger, MICHA EL Francetta Found in Treatment: 0 Electronic Signature(s) Signed: 04/01/2022 4:44:57 PM By: Rosalio Loud MSN RN CNS WTA Entered By: Rosalio Loud on 04/01/2022 16:44:57 -------------------------------------------------------------------------------- Multi Wound Chart Details Patient Name: Date of Service: Christopher [name], ERV IN Good. 04/01/2022 2:15 PM Medical Record Number: [id number] Patient Account Number: [id number] Date of Birth/Sex: Treating RN: 1928-11-12 (89 y.o. Seward Meth Primary Care Syris Brookens: Harrel Lemon Other Clinician: Referring Amberlee Garvey: Treating Caelyn Route/Extender: RO BSO N, MICHA EL Percell Locus, John Weeks in Treatment: 0 Vital Signs Height(in): 53 Pulse(bpm): 64 Weight(lbs): 138 Blood Pressure(mmHg): 151/55 Body Mass Index(BMI): 20.4 Temperature(F): 97.8 Respiratory Rate(breaths/min): 18 [1:Photos:] [N/A:N/A] Right Upper Arm N/A N/A Wound Location: Trauma N/A N/A Wounding Event: Skin T ear N/A N/A Primary Etiology: Chronic Obstructive Pulmonary N/A N/A Comorbid History: Disease (COPD), Arrhythmia, Congestive  Heart Failure, Coronary Artery Disease, Hypertension, Peripheral Arterial Disease 03/20/2022 N/A N/A Date Acquired: 0 N/A N/A Weeks of Treatment: Open N/A N/A Wound Status: No N/A N/A Wound Recurrence: 4.6x5x0.1 N/A N/A Measurements Good x W x D (cm) 18.064 N/A N/A A (cm) : rea 1.806 N/A N/A Volume (cm) : -4.50% N/A N/A % Reduction in A rea: -4.50% N/A N/A % Reduction in Volume: Partial Thickness N/A N/A Classification: Large N/A N/A  Exudate A mount: Sanguinous N/A N/A Exudate Type: red N/A N/A Exudate Color: Distinct, outline attached N/A N/A Wound Margin: None Present (0%) N/A N/A Granulation A mount: None Present (0%) N/A N/A Necrotic A mount: Fascia: No N/A N/A Exposed Structures: Fat Layer (Subcutaneous Tissue): No Tendon: No Muscle: No Joint: No Silvers[place], Ammaar Good ([id number]) [id number].pdf Page 5 of 9 Bone: No Limited to Skin Breakdown None N/A N/A Epithelialization: Treatment Notes Electronic Signature(s) Signed: 04/01/2022 4:45:10 PM By: Rosalio Loud MSN RN CNS WTA Entered By: Rosalio Loud on 04/01/2022 16:45:09 -------------------------------------------------------------------------------- Multi-Disciplinary Care Plan Details Patient Name: Date of Service: Christopher [name], ERV IN Good. 04/01/2022 2:15 PM Medical Record Number: [id number] Patient Account Number: [id number] Date of Birth/Sex: Treating RN: 23-Aug-1928 (89 y.o. Seward Meth Primary Care Janeisha Ryle: Harrel Lemon Other Clinician: Referring Maylin Freeburg: Treating Serinity Ware/Extender: RO BSO N, MICHA EL Percell Locus, Moses Manners in Treatment: 0 Active Inactive Abuse / Safety / Falls / Self Care Management Nursing Diagnoses: Potential for falls Goals: Patient will remain injury free related to falls Date Initiated: 03/27/2022 Target Resolution Date: 03/27/2022 Goal Status: Active Patient/caregiver will verbalize understanding of skin care regimen Date Initiated:  03/27/2022 Target Resolution Date: 03/27/2022 Goal Status: Active Patient/caregiver will verbalize/demonstrate measure taken to improve self care Date Initiated: 03/27/2022 Target Resolution Date: 03/27/2022 Goal Status: Active Patient/caregiver will verbalize/demonstrate measures taken to improve the patient's personal safety Date Initiated: 03/27/2022 Target Resolution Date: 03/27/2022 Goal Status: Active Interventions: Podiatry chair, stretcher in low position and side rails up as needed Notes: Necrotic Tissue Nursing Diagnoses: Impaired tissue integrity related to necrotic/devitalized tissue Knowledge deficit related to management of necrotic/devitalized tissue Goals: Necrotic/devitalized tissue will be minimized in the wound bed Date Initiated: 03/27/2022 Target Resolution Date: 03/27/2022 Goal Status: Active Patient/caregiver will verbalize understanding of reason and process for debridement of necrotic tissue Date Initiated: 03/27/2022 Target Resolution Date: 03/27/2022 Goal Status: Active Interventions: DASTAN, SCREWS ([id number]) [phone].pdf Page 6 of 9 Assess patient pain level pre-, during and post procedure and prior to discharge Provide education on necrotic tissue and debridement process Treatment Activities: Apply topical anesthetic as ordered : 03/27/2022 Notes: Orientation to the Wound Care Program Nursing Diagnoses: Knowledge deficit related to the wound healing center program Goals: Patient/caregiver will verbalize understanding of the [place] Date Initiated: 03/27/2022 Target Resolution Date: 03/27/2022 Goal Status: Active Interventions: Provide education on orientation to the wound center Notes: Wound/Skin Impairment Nursing Diagnoses: Impaired tissue integrity Goals: Patient/caregiver will verbalize understanding of skin care regimen Date Initiated: 03/27/2022 Target Resolution Date: 03/28/2022 Goal Status:  Active Ulcer/skin breakdown will have a volume reduction of 30% by week 4 Date Initiated: 03/27/2022 Target Resolution Date: 04/24/2022 Goal Status: Active Ulcer/skin breakdown will have a volume reduction of 50% by week 8 Date Initiated: 03/27/2022 Target Resolution Date: 05/22/2022 Goal Status: Active Ulcer/skin breakdown will have a volume reduction of 80% by week 12 Date Initiated: 03/27/2022 Target Resolution Date: 06/19/2022 Goal Status: Active Ulcer/skin breakdown will heal within 14 weeks Date Initiated: 03/27/2022 Target Resolution Date: 07/03/2022 Goal Status: Active Interventions: Assess patient/caregiver ability to obtain necessary supplies Assess patient/caregiver ability to perform ulcer/skin care regimen upon admission and as needed Assess ulceration(s) every visit Treatment Activities: Referred to DME Jovonta Levit for dressing supplies : 03/27/2022 Topical wound management initiated : 03/27/2022 Notes: Electronic Signature(s) Signed: 04/01/2022 4:47:05 PM By: Rosalio Loud MSN RN CNS WTA Entered By: Rosalio Loud on 04/01/2022 16:47:05 -------------------------------------------------------------------------------- Pain Assessment Details Patient Name: Date of Service: [name].  04/01/2022 2:15 PM [name] ([id number]) [id number] Page 7 of 9 Medical Record Number: [id number] Patient Account Number: [id number] Date of Birth/Sex: Treating RN: 09/19/1928 (89 y.o. Seward Meth Primary Care Jarita Raval: Harrel Lemon Other Clinician: Referring Riona Lahti: Treating Dorothie Wah/Extender: RO BSO N, [place] EL Percell Locus, John Weeks in Treatment: 0 Active Problems Location of Pain Severity and Description of Pain Patient Has Paino No Site Locations Pain Management and Medication Current Pain Management: Electronic Signature(s) Signed: 04/01/2022 4:44:46 PM By: Rosalio Loud MSN RN CNS WTA Entered By: Rosalio Loud on 04/01/2022  16:44:46 -------------------------------------------------------------------------------- Patient/Caregiver Education Details Patient Name: Date of Service: [name], ERV IN Good. 3/4/2024andnbsp2:15 PM Medical Record Number: [id number] Patient Account Number: [id number] Date of Birth/Gender: Treating RN: 22-Oct-1928 (89 y.o. Seward Meth Primary Care Physician: Harrel Lemon Other Clinician: Referring Physician: Treating Physician/Extender: RO BSO N, [place] EL Francetta Found in Treatment: 0 Education Assessment Education Provided To: Patient Education Topics Provided Wound/Skin Impairment: Handouts: Caring for Your Ulcer Methods: Explain/Verbal Responses: State content correctly ALVARO, CURRIN ([id number]) [id number] Page 8 of 9 Electronic Signature(s) Signed: 04/01/2022 5:29:13 PM By: Rosalio Loud MSN RN CNS WTA Entered By: Rosalio Loud on 04/01/2022 16:47:18 -------------------------------------------------------------------------------- Wound Assessment Details Patient Name: Date of Service: [name]. 04/01/2022 2:15 PM Medical Record Number: [id number] Patient Account Number: [id number] Date of Birth/Sex: Treating RN: 04/10/1928 (89 y.o. Seward Meth Primary Care Kerra Guilfoil: Harrel Lemon Other Clinician: Referring Celisa Schoenberg: Treating Jonnette Nuon/Extender: RO BSO N, [place] EL Percell Locus, John Weeks in Treatment: 0 Wound Status Wound Number: 1 Primary Skin T ear Etiology: Wound Location: Right Upper Arm Wound Open Wounding Event: Trauma Status: Date Acquired: 03/20/2022 Comorbid Chronic Obstructive Pulmonary Disease (COPD), Arrhythmia, Weeks Of Treatment: 0 History: Congestive Heart Failure, Coronary Artery Disease, Hypertension, Clustered Wound: No Peripheral Arterial Disease Photos Wound Measurements Length: (cm) 4.6 Width: (cm) 5 Depth: (cm) 0.1 Area: (cm) 18.064 Volume: (cm) 1.806 % Reduction in Area: -4.5% %  Reduction in Volume: -4.5% Epithelialization: None Wound Description Classification: Partial Thickness Wound Margin: Distinct, outline attached Exudate Amount: Large Exudate Type: Sanguinous Exudate Color: red Foul Odor After Cleansing: No Slough/Fibrino No Wound Bed Granulation Amount: None Present (0%) Exposed Structure Necrotic Amount: None Present (0%) Fascia Exposed: No Fat Layer (Subcutaneous Tissue) Exposed: No Tendon Exposed: No Muscle Exposed: No Joint Exposed: No Bone Exposed: No Limited to Skin Aayaan Al, Tayven Good ([id number]) [phone].pdf Page 9 of 9 Treatment Notes Wound #1 (Upper Arm) Wound Laterality: Right Cleanser Normal Saline Discharge Instruction: Wash your hands with soap and water. Remove old dressing, discard into plastic bag and place into trash. Cleanse the wound with Normal Saline prior to applying a clean dressing using gauze sponges, not tissues or cotton balls. Do not scrub or use excessive force. Pat dry using gauze sponges, not tissue or cotton balls. Peri-Wound Care Topical Primary Dressing Xeroform 4x4-HBD (in/in) Discharge Instruction: Apply Xeroform 4x4-HBD (in/in) as directed Secondary Dressing ABD Pad 5x9 (in/in) Discharge Instruction: Cover with ABD pad [place] Roll-Medium Discharge Instruction: Apply Conforming Stretch Guaze Bandage as directed Secured With Coban Cohesive Bandage 4x5 (yds) Stretched Discharge Instruction: Apply Coban as directed. Compression Wrap Compression Stockings Add-Ons Electronic Signature(s) Signed: 04/01/2022 5:29:13 PM By: Rosalio Loud MSN RN CNS WTA Entered By: Rosalio Loud on 04/01/2022 15:11:34 -------------------------------------------------------------------------------- Vitals Details Patient Name: Date of Service: [name]. 04/01/2022 2:15 PM Medical Record Number: [id number] Patient Account Number: [id number] Date of Birth/Sex: Treating  RN: [birth year] (89  y.o. Seward Meth Primary Care Jamis Kryder: Harrel Lemon Other Clinician: Referring Migel Hannis: Treating Savi Lastinger/Extender: RO BSO N, MICHA EL Percell Locus, John Weeks in Treatment: 0 Vital Signs Time Taken: 14:56 Temperature (F): 97.8 Height (in): 69 Pulse (bpm): 59 Weight (lbs): 138 Respiratory Rate (breaths/min): 18 Body Mass Index (BMI): 20.4 Blood Pressure (mmHg): 151/55 Reference Range: 80 - 120 mg / dl Electronic Signature(s) Signed: 04/01/2022 4:44:42 PM By: Rosalio Loud MSN RN CNS WTA Entered By: Rosalio Loud on 04/01/2022 16:44:42

## 2022-04-04 ENCOUNTER — Encounter: Payer: Medicare Other | Admitting: Internal Medicine

## 2022-04-04 ENCOUNTER — Ambulatory Visit: Payer: Medicare Other | Admitting: Family

## 2022-04-04 ENCOUNTER — Telehealth (INDEPENDENT_AMBULATORY_CARE_PROVIDER_SITE_OTHER): Payer: Self-pay

## 2022-04-04 ENCOUNTER — Telehealth (INDEPENDENT_AMBULATORY_CARE_PROVIDER_SITE_OTHER): Payer: Self-pay | Admitting: Vascular Surgery

## 2022-04-04 DIAGNOSIS — S41111A Laceration without foreign body of right upper arm, initial encounter: Secondary | ICD-10-CM | POA: Diagnosis not present

## 2022-04-04 NOTE — Telephone Encounter (Signed)
Dr. Jens Som is wanting this patient to be seen By Dr Lucky Cowboy as soon as possible do to poor circulation and possibly at risk of amputation of his toes. Is there a way you can get him an appt to seen asap. Patient already has an appt on 04/18/22 for an ultra sound and consult.

## 2022-04-04 NOTE — Telephone Encounter (Signed)
Patient daughter was notified with medical recommendations

## 2022-04-04 NOTE — Telephone Encounter (Signed)
Pt now has appt 04/11/22 with you

## 2022-04-04 NOTE — Telephone Encounter (Signed)
[name] daughter want to know if her father needed a surgery in his leg with the bad toe immediately or can he give a guess of the time frame for the surgery.  She has a knee replacement on April 8th and she wants to know if she needs to cancel her surgery.  She states she really need surgery on her knee bad but will put it off if her father needs surgery soon.  Please advise.

## 2022-04-04 NOTE — Progress Notes (Signed)
ZAM, ENT ([id number]) [id number] Page 1 of 5 Visit Report for 04/01/2022 HPI Details Patient Name: Date of Service: [name], Christopher IN L. 04/01/2022 2:15 PM Medical Record Number: [id number] Patient Account Number: [id number] Date of Birth/Sex: Treating RN: [birth year] (89 y.o. [name] Primary Care Provider: Harrel Lemon Other Clinician: Referring Provider: Treating Provider/Extender: RO BSO N, MICHA EL Percell Locus, John Weeks in Treatment: 0 History of Present Illness HPI Description: 03/27/2022; patient presents for follow-up. He was seen yesterday in our clinic for the first time for a skin tear to his right arm. He was recently hospitalized on 03/18/2018 for a right carotid artery stent. During that hospitalization he developed a skin tear and has had difficulty controlling the bleeding. He was seen in our clinic yesterday and devitalized tissue was taken off and the site cauterized with silver nitrate. Despite this he continues to have active bleeding soaking through dressings. He is currently on Eliquis and aspirin. He states this is prescribed by his cardiologist. Daughter notes that patient appears more weak than usual. Vitals are overall stable with no tachycardia. Patient is alert and oriented. 3/4 after the patient was here last week. She was cauterized but ended up in the hospital. During this admission the patient's wound wound dressing was not healed and she was discharged to return to clinic here. The original wound is on the right lateral arm. We noticed that there is a similar area on the left anterior arm which looks as though it seemed danger of bleeding but is not currently bleeding. The patient is on Eliquis. Electronic Signature(s) Signed: 04/01/2022 4:38:26 PM By: Linton Ham MD Entered By: Linton Ham on 04/01/2022 15:48:40 -------------------------------------------------------------------------------- Physical Exam  Details Patient Name: Date of Service: [name], Christopher IN L. 04/01/2022 2:15 PM Medical Record Number: [id number] Patient Account Number: [id number] Date of Birth/Sex: Treating RN: 1928-05-20 (89 y.o. [name] Primary Care Provider: Harrel Lemon Other Clinician: Referring Provider: Treating Provider/Extender: RO BSO N, MICHA EL Percell Locus, John Weeks in Treatment: 0 Constitutional Patient is hypertensive.. Pulse regular and within target range for patient.Marland Kitchen Respirations regular, non-labored and within target range.. Temperature is normal and within the target range for the patient.Marland Kitchen appears in no distress. Notes Wound exam; right arm. Large circular wound with granulation tissue. Fortunately today there was no active bleeding no serous drainage. No evidence of surrounding infection this is after spending the weekend in hospital with the dressing intact. On the left upper arm there is a hematoma of moderate degree but this will need to be watched. No evidence of infection in either area Electronic Signature(s) Signed: 04/01/2022 4:38:26 PM By: Linton Ham MD NARADA, FREES ([id number]) By: Linton Ham MD [id number] Page 2 of 5 Signed: 04/01/2022 4:38:26 PM Entered By: Linton Ham on 04/01/2022 15:50:20 -------------------------------------------------------------------------------- Physician Orders Details Patient Name: Date of Service: [name], Christopher IN L. 04/01/2022 2:15 PM Medical Record Number: [id number] Patient Account Number: [id number] Date of Birth/Sex: Treating RN: 11-Feb-1928 (89 y.o. [name] Primary Care Provider: Harrel Lemon Other Clinician: Referring Provider: Treating Provider/Extender: RO BSO N, [place] EL Percell Locus, John Weeks in Treatment: 0 Verbal / Phone Orders: No Diagnosis Coding ICD-10 Coding Code Description [id number] Laceration without foreign body of right upper arm, initial encounter Z79.01 Long term  (current) use of anticoagulants I73.89 Other specified peripheral vascular diseases J44.9 Chronic obstructive pulmonary disease, unspecified Follow-up Appointments Return Appointment in 1 week. Nurse Visit as needed - Thursday for dressing change Wound Treatment  Wound #1 - Upper Arm Wound Laterality: Right Cleanser: Normal Saline 1 x Per Day/30 Days Discharge Instructions: Wash your hands with soap and water. Remove old dressing, discard into plastic bag and place into trash. Cleanse the wound with Normal Saline prior to applying a clean dressing using gauze sponges, not tissues or cotton balls. Do not scrub or use excessive force. Pat dry using gauze sponges, not tissue or cotton balls. Prim Dressing: Xeroform 4x4-HBD (in/in) 1 x Per Day/30 Days ary Discharge Instructions: Apply Xeroform 4x4-HBD (in/in) as directed Secondary Dressing: ABD Pad 5x9 (in/in) 1 x Per Day/30 Days Discharge Instructions: Cover with ABD pad Secondary Dressing: Conforming Guaze Roll-Medium 1 x Per Day/30 Days Discharge Instructions: Apply Conforming Stretch Guaze Bandage as directed Secured With: Coban Cohesive Bandage 4x5 (yds) Stretched 1 x Per Day/30 Days Discharge Instructions: Apply Coban as directed. Electronic Signature(s) Signed: 04/01/2022 4:46:11 PM By: Rosalio Loud MSN RN CNS WTA Signed: 04/02/2022 3:41:05 PM By: Linton Ham MD Entered By: Rosalio Loud on 04/01/2022 [place], [name] ([id number]) [id number] Page 3 of 5 -------------------------------------------------------------------------------- Problem List Details Patient Name: Date of Service: [name], Christopher IN L. 04/01/2022 2:15 PM Medical Record Number: [id number] Patient Account Number: [id number] Date of Birth/Sex: Treating RN: 05-Jul-1928 (89 y.o. [name] Primary Care Provider: Harrel Lemon Other Clinician: Referring Provider: Treating Provider/Extender: RO BSO N, [place] EL Percell Locus,  John Weeks in Treatment: 0 Active Problems ICD-10 Encounter Code Description Active Date MDM Diagnosis S41.111A Laceration without foreign body of right upper arm, initial encounter 03/26/2022 No Yes Z79.01 Long term (current) use of anticoagulants 03/26/2022 No Yes I73.89 Other specified peripheral vascular diseases 03/26/2022 No Yes J44.9 Chronic obstructive pulmonary disease, unspecified 03/26/2022 No Yes Inactive Problems Resolved Problems Electronic Signature(s) Signed: 04/01/2022 4:38:26 PM By: Linton Ham MD Entered By: Linton Ham on 04/01/2022 15:44:42 -------------------------------------------------------------------------------- Progress Note Details Patient Name: Date of Service: Christopher [name], Christopher IN L. 04/01/2022 2:15 PM Medical Record Number: [id number] Patient Account Number: [id number] Date of Birth/Sex: Treating RN: 1929/01/12 (89 y.o. [name] Primary Care Provider: Harrel Lemon Other Clinician: Referring Provider: Treating Provider/Extender: RO BSO Delane Ginger, [place] EL Percell Locus, Moses Manners in Treatment: 0 DEBAR, [name] ([id number]) [id number] Page 4 of 5 Subjective History of Present Illness (HPI) 03/27/2022; patient presents for follow-up. He was seen yesterday in our clinic for the first time for a skin tear to his right arm. He was recently hospitalized on 03/18/2018 for a right carotid artery stent. During that hospitalization he developed a skin tear and has had difficulty controlling the bleeding. He was seen in our clinic yesterday and devitalized tissue was taken off and the site cauterized with silver nitrate. Despite this he continues to have active bleeding soaking through dressings. He is currently on Eliquis and aspirin. He states this is prescribed by his cardiologist. Daughter notes that patient appears more weak than usual. Vitals are overall stable with no tachycardia. Patient is alert and oriented. 3/4 after the  patient was here last week. She was cauterized but ended up in the hospital. During this admission the patient's wound wound dressing was not healed and she was discharged to return to clinic here. The original wound is on the right lateral arm. We noticed that there is a similar area on the left anterior arm which looks as though it seemed danger of bleeding but is not currently bleeding. The patient is on Eliquis. Objective Constitutional Patient is hypertensive.. Pulse regular and within target range for  patient.Marland Kitchen Respirations regular, non-labored and within target range.. Temperature is normal and within the target range for the patient.Marland Kitchen appears in no distress. Vitals Time Taken: 2:56 PM, Height: 69 in, Weight: 138 lbs, BMI: 20.4, Temperature: 97.8 F, Pulse: 59 bpm, Respiratory Rate: 18 breaths/min, Blood Pressure: 151/55 mmHg. General Notes: Wound exam; right arm. Large circular wound with granulation tissue. Fortunately today there was no active bleeding no serous drainage. No evidence of surrounding infection this is after spending the weekend in hospital with the dressing intact. On the left upper arm there is a hematoma of moderate degree but this will need to be watched. No evidence of infection in either area Integumentary (Hair, Skin) Wound #1 status is Open. Original cause of wound was Trauma. The date acquired was: 03/20/2022. The wound is located on the Right Upper Arm. The wound measures 4.6cm length x 5cm width x 0.1cm depth; 18.064cm^2 area and 1.806cm^3 volume. The wound is limited to skin breakdown. There is a large amount of sanguinous drainage noted. The wound margin is distinct with the outline attached to the wound base. There is no granulation within the wound bed. There is no necrotic tissue within the wound bed. Assessment Active Problems ICD-10 Laceration without foreign body of right upper arm, initial encounter Long term (current) use of anticoagulants Other  specified peripheral vascular diseases Chronic obstructive pulmonary disease, unspecified Plan 1. The circular wound on the right upper arm is not bleeding. Nor is it an active need of debridement. We are going to change the dressing here to Xeroform and Coban 2. Our intake nurse noted a small raised hematoma on the left outer arm. This is firm. Not overtly infected but is in danger of opening we put Coban on this Electronic Signature(s) Signed: 04/01/2022 4:38:26 PM By: Linton Ham MD Entered By: Linton Ham on 04/01/2022 15:52:09 [name] ([id number]) [id number] Page 5 of 5 -------------------------------------------------------------------------------- SuperBill Details Patient Name: Date of Service: [name], Christopher IN L. 04/01/2022 Medical Record Number: [id number] Patient Account Number: [id number] Date of Birth/Sex: Treating RN: 20-Sep-1928 (89 y.o. [name] Primary Care Provider: Harrel Lemon Other Clinician: Referring Provider: Treating Provider/Extender: RO BSO N, [place] EL Percell Locus, John Weeks in Treatment: 0 Diagnosis Coding ICD-10 Codes Code Description [id number] Laceration without foreign body of right upper arm, initial encounter Z79.01 Long term (current) use of anticoagulants I73.89 Other specified peripheral vascular diseases J44.9 Chronic obstructive pulmonary disease, unspecified Facility Procedures : CPT4 Code: [id number] Description: 99213 - WOUND CARE VISIT-LEV 3 EST PT Modifier: Quantity: 1 Physician Procedures : CPT4 Code Description Modifier [id number] - WC PHYS LEVEL 3 - EST PT ICD-10 Diagnosis Description [id number] Laceration without foreign body of right upper arm, initial encounter Z79.01 Long term (current) use of anticoagulants Quantity: 1 Electronic Signature(s) Signed: 04/01/2022 4:47:00 PM By: Rosalio Loud MSN RN CNS WTA Signed: 04/02/2022 3:41:05 PM By: Linton Ham MD Previous Signature:  04/01/2022 4:38:26 PM Version By: Linton Ham MD Entered By: Rosalio Loud on 04/01/2022 16:47:00

## 2022-04-04 NOTE — Telephone Encounter (Signed)
Let's try to get him in within the next week to see myself or Dew

## 2022-04-06 NOTE — Progress Notes (Signed)
WARDIE, WION ([id number]) [id number] Page 1 of 11 Visit Report for 04/04/2022 Arrival Information Details Patient Name: Date of Service: [name], [name]. 04/04/2022 2:00 PM Medical Record Number: [id number] Patient Account Number: [id number] Date of Birth/Sex: Treating RN: 23-Mar-1928 (89 y.o. Clide Dales Primary Care Tashana Haberl: Harrel Lemon Other Clinician: Referring Erian Rosengren: Treating Dhriti Fales/Extender: RO BSO N, MICHA EL Percell Locus, John Weeks in Treatment: 1 Visit Information History Since Last Visit Added or deleted any medications: No Patient Arrived: Wheel Chair Any new allergies or adverse reactions: No Arrival Time: 14:12 Had a fall or experienced change in No Accompanied By: family activities of daily living that may affect Transfer Assistance: None risk of falls: Patient Has Alerts: Yes Hospitalized since last visit: No Patient Alerts: Patient on Blood Thinner Has Dressing in Place as Prescribed: Yes '81mg'$  aspirin Pain Present Now: No Eliquis NOT DIABETIC Electronic Signature(s) Signed: 04/04/2022 4:59:51 PM By: Levora Dredge Entered By: Levora Dredge on 04/04/2022 14:13:00 -------------------------------------------------------------------------------- Clinic Level of Care Assessment Details Patient Name: Date of Service: [name], [name]. 04/04/2022 2:00 PM Medical Record Number: [id number] Patient Account Number: [id number] Date of Birth/Sex: Treating RN: 19-Jan-1929 (89 y.o. Clide Dales Primary Care Deidrick Rainey: Harrel Lemon Other Clinician: Referring Novice Vrba: Treating Janos Shampine/Extender: RO BSO N, [place] EL Percell Locus, John Weeks in Treatment: 1 Clinic Level of Care Assessment Items TOOL 4 Quantity Score '[]'$  - 0 Use when only an EandM is performed on FOLLOW-UP visit ASSESSMENTS - Nursing Assessment / Reassessment X- 1 10 Reassessment of Co-morbidities (includes updates in patient status) X- 1  5 Reassessment of Adherence to Treatment Plan ASSESSMENTS - Wound and Skin A ssessment / Reassessment '[]'$  - 0 Simple Wound Assessment / Reassessment - one wound X- 2 5 Complex Wound Assessment / Reassessment - multiple wounds [name], [name] Good ([id number] Page 2 of 11 '[]'$  - 0 Dermatologic / Skin Assessment (not related to wound area) ASSESSMENTS - Focused Assessment '[]'$  - 0 Circumferential Edema Measurements - multi extremities '[]'$  - 0 Nutritional Assessment / Counseling / Intervention '[]'$  - 0 Lower Extremity Assessment (monofilament, tuning fork, pulses) '[]'$  - 0 Peripheral Arterial Disease Assessment (using hand held doppler) ASSESSMENTS - Ostomy and/or Continence Assessment and Care '[]'$  - 0 Incontinence Assessment and Management '[]'$  - 0 Ostomy Care Assessment and Management (repouching, etc.) PROCESS - Coordination of Care X - Simple Patient / Family Education for ongoing care 1 15 '[]'$  - 0 Complex (extensive) Patient / Family Education for ongoing care '[]'$  - 0 Staff obtains Programmer, systems, Records, T Results / Process Orders est '[]'$  - 0 Staff telephones HHA, Nursing Homes / Clarify orders / etc '[]'$  - 0 Routine Transfer to another Facility (non-emergent condition) '[]'$  - 0 Routine Hospital Admission (non-emergent condition) '[]'$  - 0 New Admissions / Biomedical engineer / Ordering NPWT Apligraf, etc. , '[]'$  - 0 Emergency Hospital Admission (emergent condition) X- 1 10 Simple Discharge Coordination '[]'$  - 0 Complex (extensive) Discharge Coordination PROCESS - Special Needs '[]'$  - 0 Pediatric / Minor Patient Management '[]'$  - 0 Isolation Patient Management '[]'$  - 0 Hearing / Language / Visual special needs '[]'$  - 0 Assessment of Community assistance (transportation, D/C planning, etc.) '[]'$  - 0 Additional assistance / Altered mentation '[]'$  - 0 Support Surface(s) Assessment (bed, cushion, seat, etc.) INTERVENTIONS - Wound Cleansing / Measurement '[]'$  -  0 Simple Wound Cleansing - one wound X- 2 5 Complex Wound Cleansing - multiple wounds X- 1 5 Wound Imaging (photographs - any number of  wounds) '[]'$  - 0 Wound Tracing (instead of photographs) '[]'$  - 0 Simple Wound Measurement - one wound X- 2 5 Complex Wound Measurement - multiple wounds INTERVENTIONS - Wound Dressings X - Small Wound Dressing one or multiple wounds 2 10 '[]'$  - 0 Medium Wound Dressing one or multiple wounds '[]'$  - 0 Large Wound Dressing one or multiple wounds X- 1 5 Application of Medications - topical '[]'$  - 0 Application of Medications - injection INTERVENTIONS - Miscellaneous '[]'$  - 0 External ear exam '[]'$  - 0 Specimen Collection (cultures, biopsies, blood, body fluids, etc.) '[]'$  - 0 Specimen(s) / Culture(s) sent or taken to Lab for analysis [name], [name] ([id number]) [id number] Page 3 of 11 '[]'$  - 0 Patient Transfer (multiple staff / Civil Service fast streamer / Similar devices) '[]'$  - 0 Simple Staple / Suture removal (25 or less) '[]'$  - 0 Complex Staple / Suture removal (26 or more) '[]'$  - 0 Hypo / Hyperglycemic Management (close monitor of Blood Glucose) '[]'$  - 0 Ankle / Brachial Index (ABI) - do not check if billed separately X- 1 5 Vital Signs Has the patient been seen at the hospital within the last three years: Yes Total Score: 105 Level Of Care: New/Established - Level 3 Electronic Signature(s) Signed: 04/04/2022 4:59:51 PM By: Levora Dredge Entered By: Levora Dredge on 04/04/2022 15:46:55 -------------------------------------------------------------------------------- Encounter Discharge Information Details Patient Name: Date of Service: [name], [name]. 04/04/2022 2:00 PM Medical Record Number: [id number] Patient Account Number: [id number] Date of Birth/Sex: Treating RN: 09-13-1928 (89 y.o. Clide Dales Primary Care Jakeisha Stricker: Harrel Lemon Other Clinician: Referring Eleonor Ocon: Treating Yasir Kitner/Extender: RO BSO N, MICHA EL  Francetta Found in Treatment: 1 Encounter Discharge Information Items Discharge Condition: Stable Ambulatory Status: Wheelchair Discharge Destination: Home Transportation: Private Auto Accompanied By: daughter Schedule Follow-up Appointment: Yes Clinical Summary of Care: Electronic Signature(s) Signed: 04/04/2022 3:48:36 PM By: Levora Dredge Entered By: Levora Dredge on 04/04/2022 15:48:36 -------------------------------------------------------------------------------- Lower Extremity Assessment Details Patient Name: Date of Service: [name], [name]. 04/04/2022 2:00 PM Medical Record Number: [id number] Patient Account Number: [id number] Date of Birth/Sex: Treating RN: 1928-03-06 (89 y.o. Clide Dales Primary Care Zaria Taha: Harrel Lemon Other Clinician: Referring Jonmarc Bodkin: Treating Syvanna Ciolino/Extender: [address] [place] Christopher Bernhardt [place], [place] Good ([id number]) [id number] Page 4 of 11 Weeks in Treatment: 1 Electronic Signature(s) Signed: 04/04/2022 2:34:35 PM By: Levora Dredge Entered By: Levora Dredge on 04/04/2022 14:34:34 -------------------------------------------------------------------------------- Multi Wound Chart Details Patient Name: Date of Service: [name], [name]. 04/04/2022 2:00 PM Medical Record Number: [id number] Patient Account Number: [id number] Date of Birth/Sex: Treating RN: Dec 21, 1928 (89 y.o. Clide Dales Primary Care Leni Pankonin: Harrel Lemon Other Clinician: Referring Veroncia Jezek: Treating Sue Fernicola/Extender: RO BSO N, MICHA EL Percell Locus, John Weeks in Treatment: 1 Vital Signs Height(in): 20 Pulse(bpm): 79 Weight(lbs): 138 Blood Pressure(mmHg): 118/56 Body Mass Index(BMI): 20.4 Temperature(F): 98.2 Respiratory Rate(breaths/min): 18 [1:Photos:] [N/A:N/A] Right Upper Arm Left Forearm N/A Wound Location: Trauma Trauma N/A Wounding Event: Skin T ear Trauma, Other N/A Primary  Etiology: Chronic Obstructive Pulmonary Chronic Obstructive Pulmonary N/A Comorbid History: Disease (COPD), Arrhythmia, Disease (COPD), Arrhythmia, Congestive Heart Failure, Coronary Congestive Heart Failure, Coronary Artery Disease, Hypertension, Artery Disease, Hypertension, Peripheral Arterial Disease Peripheral Arterial Disease 03/20/2022 03/28/2022 N/A Date Acquired: 1 0 N/A Weeks of Treatment: Open Open N/A Wound Status: No No N/A Wound Recurrence: 3.6x3.5x0.1 1.6x1x0.1 N/A Measurements Good x W x D (cm) 9.896 1.257 N/A A (cm) : rea 0.99 0.126 N/A Volume (cm) : 42.70%  N/A N/A % Reduction in A rea: 42.70% N/A N/A % Reduction in Volume: Partial Thickness Partial Thickness N/A Classification: Medium Medium N/A Exudate A mount: Serosanguineous Serosanguineous N/A Exudate Type: red, brown red, brown N/A Exudate Color: Distinct, outline attached N/A N/A Wound Margin: Medium (34-66%) Small (1-33%) N/A Granulation A mount: Pink, Pale Red N/A Granulation Quality: Medium (34-66%) Large (67-100%) N/A Necrotic A mount: Adherent Slough Eschar N/A Necrotic Tissue: Fat Layer (Subcutaneous Tissue): Yes Fat Layer (Subcutaneous Tissue): Yes N/A Exposed Structures: Fascia: No Tendon: No Muscle: No Joint: No [name], [name] ([id number] Page 5 of 11 Bone: No Small (1-33%) None N/A Epithelialization: Treatment Notes Electronic Signature(s) Signed: 04/04/2022 3:44:35 PM By: Levora Dredge Entered By: Levora Dredge on 04/04/2022 15:44:35 -------------------------------------------------------------------------------- Multi-Disciplinary Care Plan Details Patient Name: Date of Service: [name], [name]. 04/04/2022 2:00 PM Medical Record Number: [id number] Patient Account Number: [id number] Date of Birth/Sex: Treating RN: 03/02/1928 (89 y.o. Clide Dales Primary Care Teonia Yager: Harrel Lemon Other Clinician: Referring  Graysin Luczynski: Treating Jeanenne Licea/Extender: RO BSO N, MICHA EL Percell Locus, Moses Manners in Treatment: 1 Active Inactive Abuse / Safety / Falls / Self Care Management Nursing Diagnoses: Potential for falls Goals: Patient will remain injury free related to falls Date Initiated: 03/27/2022 Target Resolution Date: 03/27/2022 Goal Status: Active Patient/caregiver will verbalize understanding of skin care regimen Date Initiated: 03/27/2022 Date Inactivated: 04/04/2022 Target Resolution Date: 03/27/2022 Goal Status: Met Patient/caregiver will verbalize/demonstrate measure taken to improve self care Date Initiated: 03/27/2022 Target Resolution Date: 03/27/2022 Goal Status: Active Patient/caregiver will verbalize/demonstrate measures taken to improve the patient's personal safety Date Initiated: 03/27/2022 Target Resolution Date: 03/27/2022 Goal Status: Active Interventions: Podiatry chair, stretcher in low position and side rails up as needed Notes: Necrotic Tissue Nursing Diagnoses: Impaired tissue integrity related to necrotic/devitalized tissue Knowledge deficit related to management of necrotic/devitalized tissue Goals: Necrotic/devitalized tissue will be minimized in the wound bed Date Initiated: 03/27/2022 Target Resolution Date: 03/27/2022 Goal Status: Active Patient/caregiver will verbalize understanding of reason and process for debridement of necrotic tissue Date Initiated: 03/27/2022 Target Resolution Date: 03/27/2022 Goal Status: Active Interventions: [name], [name] ([id number]) [phone].pdf Page 6 of 11 Assess patient pain level pre-, during and post procedure and prior to discharge Provide education on necrotic tissue and debridement process Treatment Activities: Apply topical anesthetic as ordered : 03/27/2022 Notes: Wound/Skin Impairment Nursing Diagnoses: Impaired tissue integrity Goals: Patient/caregiver will verbalize understanding of skin care  regimen Date Initiated: 03/27/2022 Date Inactivated: 04/04/2022 Target Resolution Date: 03/28/2022 Goal Status: Met Ulcer/skin breakdown will have a volume reduction of 30% by week 4 Date Initiated: 03/27/2022 Target Resolution Date: 04/24/2022 Goal Status: Active Ulcer/skin breakdown will have a volume reduction of 50% by week 8 Date Initiated: 03/27/2022 Target Resolution Date: 05/22/2022 Goal Status: Active Ulcer/skin breakdown will have a volume reduction of 80% by week 12 Date Initiated: 03/27/2022 Target Resolution Date: 06/19/2022 Goal Status: Active Ulcer/skin breakdown will heal within 14 weeks Date Initiated: 03/27/2022 Target Resolution Date: 07/03/2022 Goal Status: Active Interventions: Assess patient/caregiver ability to obtain necessary supplies Assess patient/caregiver ability to perform ulcer/skin care regimen upon admission and as needed Assess ulceration(s) every visit Treatment Activities: Referred to DME Yehudit Fulginiti for dressing supplies : 03/27/2022 Topical wound management initiated : 03/27/2022 Notes: Electronic Signature(s) Signed: 04/04/2022 3:47:45 PM By: Levora Dredge Entered By: Levora Dredge on 04/04/2022 15:47:45 -------------------------------------------------------------------------------- Pain Assessment Details Patient Name: Date of Service: [name], [name]. 04/04/2022 2:00 PM Medical Record Number: [id number] Patient Account Number: [id number] Date  of Birth/Sex: Treating RN: [birth year] (89 y.o. Clide Dales Primary Care Reality Dejonge: Harrel Lemon Other Clinician: Referring Rollo Farquhar: Treating Yoona Ishii/Extender: RO BSO N, MICHA EL Percell Locus, John Weeks in Treatment: 1 Active Problems Location of Pain Severity and Description of Pain Patient Has Paino No Site Locations Rate the pain. [name], [name] ([id number]) [id number] Page 7 of 11 Rate the pain. Current Pain Level: 0 Pain Management and Medication Current  Pain Management: Electronic Signature(s) Signed: 04/04/2022 2:34:50 PM By: Levora Dredge Entered By: Levora Dredge on 04/04/2022 14:34:50 -------------------------------------------------------------------------------- Patient/Caregiver Education Details Patient Name: Date of Service: [name], [name]. 3/7/2024andnbsp2:00 PM Medical Record Number: [id number] Patient Account Number: [id number] Date of Birth/Gender: Treating RN: [birth year] (89 y.o. Clide Dales Primary Care Physician: Harrel Lemon Other Clinician: Referring Physician: Treating Physician/Extender: RO BSO N, Douglas EL Francetta Found in Treatment: 1 Education Assessment Education Provided To: Patient and Caregiver Education Topics Provided Wound/Skin Impairment: Handouts: Caring for Your Ulcer Methods: Explain/Verbal Responses: State content correctly Electronic Signature(s) Signed: 04/04/2022 4:59:51 PM By: Levora Dredge Entered By: Levora Dredge on 04/04/2022 15:47:55 [name], [name] ([id number] Page 8 of 11 -------------------------------------------------------------------------------- Wound Assessment Details Patient Name: Date of Service: [name], [name]. 04/04/2022 2:00 PM Medical Record Number: [id number] Patient Account Number: [id number] Date of Birth/Sex: Treating RN: [birth year] (89 y.o. Clide Dales Primary Care Chryl Holten: Harrel Lemon Other Clinician: Referring Lundon Verdejo: Treating Dannel Rafter/Extender: RO BSO N, [place] EL Percell Locus, John Weeks in Treatment: 1 Wound Status Wound Number: 1 Primary Skin T ear Etiology: Wound Location: Right Upper Arm Wound Open Wounding Event: Trauma Status: Date Acquired: 03/20/2022 Comorbid Chronic Obstructive Pulmonary Disease (COPD), Arrhythmia, Weeks Of Treatment: 1 History: Congestive Heart Failure, Coronary Artery Disease, Hypertension, Clustered Wound: No Peripheral Arterial  Disease Photos Wound Measurements Length: (cm) 3.6 Width: (cm) 3.5 Depth: (cm) 0.1 Area: (cm) 9.896 Volume: (cm) 0.99 % Reduction in Area: 42.7% % Reduction in Volume: 42.7% Epithelialization: Small (1-33%) Tunneling: No Undermining: No Wound Description Classification: Partial Thickness Wound Margin: Distinct, outline attached Exudate Amount: Medium Exudate Type: Serosanguineous Exudate Color: red, brown Foul Odor After Cleansing: No Slough/Fibrino Yes Wound Bed Granulation Amount: Medium (34-66%) Exposed Structure Granulation Quality: Pink, Pale Fascia Exposed: No Necrotic Amount: Medium (34-66%) Fat Layer (Subcutaneous Tissue) Exposed: Yes Necrotic Quality: Adherent Slough Tendon Exposed: No Muscle Exposed: No Joint Exposed: No Bone Exposed: No Treatment Notes Wound #1 (Upper Arm) Wound Laterality: Right Cleanser Normal Saline Discharge Instruction: Wash your hands with soap and water. Remove old dressing, discard into plastic bag and place into trash. Cleanse the wound with Normal Saline prior to applying a clean dressing using gauze sponges, not tissues or cotton balls. Do not scrub or use excessive [name], [name] ([id number]) [phone].pdf Page 9 of 11 force. Pat dry using gauze sponges, not tissue or cotton balls. Peri-Wound Care Topical Primary Dressing Xeroform 4x4-HBD (in/in) Discharge Instruction: Apply Xeroform 4x4-HBD (in/in) as directed Secondary Dressing ABD Pad 5x9 (in/in) Discharge Instruction: Cover with ABD pad [place] Roll-Medium Discharge Instruction: Apply Conforming Stretch Guaze Bandage as directed Secured With [employer] Dressing, Latex-free, Size 5, Small-Head / Shoulder / Thigh Compression Wrap Compression Stockings Add-Ons Electronic Signature(s) Signed: 04/04/2022 4:59:51 PM By: Levora Dredge Entered By: Levora Dredge on 04/04/2022  14:57:59 -------------------------------------------------------------------------------- Wound Assessment Details Patient Name: Date of Service: [name], [name]. 04/04/2022 2:00 PM Medical Record Number: [id number] Patient Account Number: [id number] Date of Birth/Sex: Treating RN: 05-13-1928 (89 y.o. Clide Dales  Primary Care Chaz Mcglasson: Harrel Lemon Other Clinician: Referring Merita Hawks: Treating Simonne Boulos/Extender: RO BSO N, [place] EL Percell Locus, John Weeks in Treatment: 1 Wound Status Wound Number: 2 Primary Trauma, Other Etiology: Wound Location: Left Forearm Wound Open Wounding Event: Trauma Status: Date Acquired: 03/28/2022 Notes: pt wasnt sure, states may have hit it on something Weeks Of Treatment: 0 Comorbid Chronic Obstructive Pulmonary Disease (COPD), Arrhythmia, Clustered Wound: No History: Congestive Heart Failure, Coronary Artery Disease, Hypertension, Peripheral Arterial Disease Photos Wound Measurements [name], [name] ([id number]) Length: (cm) 1.6 Width: (cm) 1 Depth: (cm) 0.1 Area: (cm) 1.257 Volume: (cm) 0.126 [id number] Page 10 of 11 % Reduction in Area: % Reduction in Volume: Epithelialization: None Tunneling: No Undermining: No Wound Description Classification: Partial Thickness Exudate Amount: Medium Exudate Type: Serosanguineous Exudate Color: red, brown Foul Odor After Cleansing: No Slough/Fibrino Yes Wound Bed Granulation Amount: Small (1-33%) Exposed Structure Granulation Quality: Red Fat Layer (Subcutaneous Tissue) Exposed: Yes Necrotic Amount: Large (67-100%) Necrotic Quality: Eschar Treatment Notes Wound #2 (Forearm) Wound Laterality: Left Cleanser Normal Saline Discharge Instruction: Wash your hands with soap and water. Remove old dressing, discard into plastic bag and place into trash. Cleanse the wound with Normal Saline prior to applying a clean dressing using gauze sponges, not tissues or  cotton balls. Do not scrub or use excessive force. Pat dry using gauze sponges, not tissue or cotton balls. Peri-Wound Care Topical Primary Dressing Xeroform 4x4-HBD (in/in) Discharge Instruction: Apply Xeroform 4x4-HBD (in/in) as directed Secondary Dressing ABD Pad 5x9 (in/in) Discharge Instruction: Cover with ABD pad [place] Roll-Medium Discharge Instruction: Apply Conforming Stretch Guaze Bandage as directed Secured With [employer] Dressing, Latex-free, Size 5, Small-Head / Shoulder / Thigh Compression Wrap Compression Stockings Add-Ons Electronic Signature(s) Signed: 04/04/2022 4:59:51 PM By: Levora Dredge Entered By: Levora Dredge on 04/04/2022 14:59:30 -------------------------------------------------------------------------------- Vitals Details Patient Name: Date of Service: [name], [name]. 04/04/2022 2:00 PM Medical Record Number: [id number] Patient Account Number: [id number] Date of Birth/Sex: Treating RN: July 25, 1928 (89 y.o. Clide Dales Primary Care Mats Jeanlouis: Harrel Lemon Other Clinician: Referring Shital Crayton: Treating Mozetta Murfin/Extender: [address], [place] [name], [name] ([id number]) [id number] Page 11 of 11 Weeks in Treatment: 1 Vital Signs Time Taken: 14:20 Temperature (F): 98.2 Height (in): 69 Pulse (bpm): 68 Weight (lbs): 138 Respiratory Rate (breaths/min): 18 Body Mass Index (BMI): 20.4 Blood Pressure (mmHg): 118/56 Reference Range: 80 - 120 mg / dl Electronic Signature(s) Signed: 04/04/2022 3:37:52 PM By: Levora Dredge Entered By: Levora Dredge on 04/04/2022 15:37:51

## 2022-04-06 NOTE — Progress Notes (Signed)
Christopher, Good ([id number]) [id number] Page 1 of 5 Visit Report for 04/04/2022 HPI Details Patient Name: Date of Service: [name], [name] 04/04/2022 2:00 PM Medical Record Number: [id number] Patient Account Number: [id number] Date of Birth/Sex: Treating RN: 11-Jul-1928 (89 y.o. [name] Primary Care Provider: Harrel Lemon Other Clinician: Referring Provider: Treating Provider/Extender: RO BSO N, MICHA EL Percell Locus, John Weeks in Treatment: 1 History of Present Illness HPI Description: 03/27/2022; patient presents for follow-up. He was seen yesterday in our clinic for the first time for a skin tear to his right arm. He was recently hospitalized on 03/18/2018 for a right carotid artery stent. During that hospitalization he developed a skin tear and has had difficulty controlling the bleeding. He was seen in our clinic yesterday and devitalized tissue was taken off and the site cauterized with silver nitrate. Despite this he continues to have active bleeding soaking through dressings. He is currently on Eliquis and aspirin. He states this is prescribed by his cardiologist. Daughter notes that patient appears more weak than usual. Vitals are overall stable with no tachycardia. Patient is alert and oriented. 3/4 after the patient was here last week. She was cauterized but ended up in the hospital. During this admission the patient's wound wound dressing was not healed and she was discharged to return to clinic here. The original wound is on the right lateral arm. We noticed that there is a similar area on the left anterior arm which looks as though it seemed danger of bleeding but is not currently bleeding. The patient is on Eliquis. 3/7; this is a patient I saw earlier this week. Predominantly for a wound on the right anterior lower extremity. In passing we noted that she had a hematoma on the left we wrapped the arm. Unfortunately it is not held together.  We used a #5 curette to remove the underlying bleeding. Fortunately this stopped. The area on the right lower extremity appears better Electronic Signature(s) Signed: 04/04/2022 4:44:08 PM By: Linton Ham MD Entered By: Linton Ham on 04/04/2022 15:10:53 -------------------------------------------------------------------------------- Physical Exam Details Patient Name: Date of Service: [name], [name] 04/04/2022 2:00 PM Medical Record Number: [id number] Patient Account Number: [id number] Date of Birth/Sex: Treating RN: 1928/08/24 (89 y.o. [name] Primary Care Provider: Harrel Lemon Other Clinician: Referring Provider: Treating Provider/Extender: RO BSO N, MICHA EL Percell Locus, John Weeks in Treatment: 1 Notes Wound exam; right arm circular wound with granulation tissue everything appears to be better. However on the left arm there was a raised hematoma from the last visit this opened and drained. I was careful not to debride this excessively apparently he has a history of easy bleeding. Electronic Signature(s) Signed: 04/04/2022 4:44:08 PM By: Linton Ham MD Entered By: Linton Ham on 04/04/2022 15:11:44 [name] ([id number]) [id number] Page 2 of 5 -------------------------------------------------------------------------------- Physician Orders Details Patient Name: Date of Service: [name], [name] 04/04/2022 2:00 PM Medical Record Number: [id number] Patient Account Number: [id number] Date of Birth/Sex: Treating RN: 02-Jul-1928 (89 y.o. [name] Primary Care Provider: Harrel Lemon Other Clinician: Referring Provider: Treating Provider/Extender: RO BSO N, MICHA EL Percell Locus, John Weeks in Treatment: 1 Verbal / Phone Orders: No Diagnosis Coding ICD-10 Coding Code Description [id number] Laceration without foreign body of right upper arm, initial encounter Z79.01 Long term (current) use of  anticoagulants I73.89 Other specified peripheral vascular diseases J44.9 Chronic obstructive pulmonary disease, unspecified Follow-up Appointments Return Appointment in 1 week. Nurse Visit as needed -  Thursday for dressing change Bathing/ Shower/ Hygiene Wound #1 Right Upper Arm Clean wound with Normal Saline or wound cleanser. Wash wounds with antibacterial soap and water. May shower; gently cleanse wound with antibacterial soap, rinse and pat dry prior to dressing wounds No tub bath. Wound Treatment Wound #1 - Upper Arm Wound Laterality: Right Cleanser: Normal Saline 2 x Per Week/30 Days Discharge Instructions: Wash your hands with soap and water. Remove old dressing, discard into plastic bag and place into trash. Cleanse the wound with Normal Saline prior to applying a clean dressing using gauze sponges, not tissues or cotton balls. Do not scrub or use excessive force. Pat dry using gauze sponges, not tissue or cotton balls. Prim Dressing: Xeroform 4x4-HBD (in/in) 2 x Per Week/30 Days ary Discharge Instructions: Apply Xeroform 4x4-HBD (in/in) as directed Secondary Dressing: ABD Pad 5x9 (in/in) 2 x Per Week/30 Days Discharge Instructions: Cover with ABD pad Secondary Dressing: Conforming Guaze Roll-Medium 2 x Per Week/30 Days Discharge Instructions: Apply Conforming Stretch Guaze Bandage as directed Secured With: Stretch Net Dressing, Latex-free, Size 5, Small-Head / Shoulder / Thigh 2 x Per Week/30 Days Wound #2 - Forearm Wound Laterality: Left Cleanser: Normal Saline 2 x Per Week/30 Days Discharge Instructions: Wash your hands with soap and water. Remove old dressing, discard into plastic bag and place into trash. Cleanse the wound with Normal Saline prior to applying a clean dressing using gauze sponges, not tissues or cotton balls. Do not scrub or use excessive force. Pat dry using gauze sponges, not tissue or cotton balls. Prim Dressing: Xeroform 4x4-HBD (in/in) 2 x Per Week/30  Days ary Discharge Instructions: Apply Xeroform 4x4-HBD (in/in) as directed Secondary Dressing: ABD Pad 5x9 (in/in) 2 x Per Week/30 Days Discharge Instructions: Cover with ABD pad Secondary Dressing: Conforming Guaze Roll-Medium 2 x Per Week/30 Days MARKEZ, ULRICH ([id number]) [id number] Page 3 of 5 Discharge Instructions: Apply Conforming Stretch Guaze Bandage as directed Secured With: Stretch Net Dressing, Latex-free, Size 5, Small-Head / Shoulder / Thigh 2 x Per Week/30 Days Electronic Signature(s) Signed: 04/04/2022 3:46:09 PM By: Levora Dredge Signed: 04/04/2022 4:44:08 PM By: Linton Ham MD Entered By: Levora Dredge on 04/04/2022 15:46:09 -------------------------------------------------------------------------------- Problem List Details Patient Name: Date of Service: [name], [name] 04/04/2022 2:00 PM Medical Record Number: [id number] Patient Account Number: [id number] Date of Birth/Sex: Treating RN: 03-10-1928 (89 y.o. [name] Primary Care Provider: Harrel Lemon Other Clinician: Referring Provider: Treating Provider/Extender: RO BSO N, MICHA EL Percell Locus, John Weeks in Treatment: 1 Active Problems ICD-10 Encounter Code Description Active Date MDM Diagnosis S41.111A Laceration without foreign body of right upper arm, initial encounter 03/26/2022 No Yes Z79.01 Long term (current) use of anticoagulants 03/26/2022 No Yes I73.89 Other specified peripheral vascular diseases 03/26/2022 No Yes J44.9 Chronic obstructive pulmonary disease, unspecified 03/26/2022 No Yes Inactive Problems Resolved Problems Electronic Signature(s) Signed: 04/04/2022 4:44:08 PM By: Linton Ham MD Entered By: Linton Ham on 04/04/2022 15:09:46 [name] ([id number]) [id number] Page 4 of 5 -------------------------------------------------------------------------------- Progress Note Details Patient Name: Date  of Service: [name], [name] 04/04/2022 2:00 PM Medical Record Number: [id number] Patient Account Number: [id number] Date of Birth/Sex: Treating RN: [birth year] (89 y.o. [name] Primary Care Provider: Harrel Lemon Other Clinician: Referring Provider: Treating Provider/Extender: RO BSO N, MICHA EL Percell Locus, John Weeks in Treatment: 1 Subjective History of Present Illness (HPI) 03/27/2022; patient presents for follow-up. He was seen yesterday in our clinic for the first time for a skin tear  to his right arm. He was recently hospitalized on 03/18/2018 for a right carotid artery stent. During that hospitalization he developed a skin tear and has had difficulty controlling the bleeding. He was seen in our clinic yesterday and devitalized tissue was taken off and the site cauterized with silver nitrate. Despite this he continues to have active bleeding soaking through dressings. He is currently on Eliquis and aspirin. He states this is prescribed by his cardiologist. Daughter notes that patient appears more weak than usual. Vitals are overall stable with no tachycardia. Patient is alert and oriented. 3/4 after the patient was here last week. She was cauterized but ended up in the hospital. During this admission the patient's wound wound dressing was not healed and she was discharged to return to clinic here. The original wound is on the right lateral arm. We noticed that there is a similar area on the left anterior arm which looks as though it seemed danger of bleeding but is not currently bleeding. The patient is on Eliquis. 3/7; this is a patient I saw earlier this week. Predominantly for a wound on the right anterior lower extremity. In passing we noted that she had a hematoma on the left we wrapped the arm. Unfortunately it is not held together. We used a #5 curette to remove the underlying bleeding. Fortunately this stopped. The area on the right lower extremity appears  better Objective Integumentary (Hair, Skin) Wound #1 status is Open. Original cause of wound was Trauma. The date acquired was: 03/20/2022. The wound has been in treatment 1 weeks. The wound is located on the Right Upper Arm. The wound measures 3.6cm length x 3.5cm width x 0.1cm depth; 9.896cm^2 area and 0.99cm^3 volume. There is Fat Layer (Subcutaneous Tissue) exposed. There is no tunneling or undermining noted. There is a medium amount of serosanguineous drainage noted. The wound margin is distinct with the outline attached to the wound base. There is medium (34-66%) pink, pale granulation within the wound bed. There is a medium (34-66%) amount of necrotic tissue within the wound bed including Adherent Slough. Wound #2 status is Open. Original cause of wound was Trauma. The date acquired was: 03/28/2022. The wound is located on the Left Forearm. The wound measures 1.6cm length x 1cm width x 0.1cm depth; 1.257cm^2 area and 0.126cm^3 volume. There is Fat Layer (Subcutaneous Tissue) exposed. There is no tunneling or undermining noted. There is a medium amount of serosanguineous drainage noted. There is small (1-33%) red granulation within the wound bed. There is a large (67-100%) amount of necrotic tissue within the wound bed including Eschar. Assessment Active Problems ICD-10 Laceration without foreign body of right upper arm, initial encounter Long term (current) use of anticoagulants Other specified peripheral vascular diseases Chronic obstructive pulmonary disease, unspecified Plan Wound exam; right arm circular wound with granulation tissue everything appears to be better. However on the left arm there was a raised hematoma from the last visit this opened and drained. I was careful not to debride this excessively apparently he has a history of easy bleeding.We used Xeroform ABDs and JESSERAY, SZOKE ([id number]) [id number] Page 5 of 5 conforming net. 2. No  evidence of infection. Moderate amount of subcutaneous bleeding but I think this is going to stop Electronic Signature(s) Signed: 04/04/2022 4:44:08 PM By: Linton Ham MD Entered By: Linton Ham on 04/04/2022 15:15:25 -------------------------------------------------------------------------------- SuperBill Details Patient Name: Date of Service: [name], [name] 04/04/2022 Medical Record Number: [id number] Patient Account Number: [id number] Date of Birth/Sex:  Treating RN: 11-21-1928 (89 y.o. [name] Primary Care Provider: Harrel Lemon Other Clinician: Referring Provider: Treating Provider/Extender: RO BSO N, [place] EL Percell Locus, John Weeks in Treatment: 1 Diagnosis Coding ICD-10 Codes Code Description [phone] Laceration without foreign body of right upper arm, initial encounter Z79.01 Long term (current) use of anticoagulants I73.89 Other specified peripheral vascular diseases J44.9 Chronic obstructive pulmonary disease, unspecified Facility Procedures : CPT4 Code: [id number] Description: 99213 - WOUND CARE VISIT-LEV 3 EST PT Modifier: Quantity: 1 Physician Procedures : CPT4 Code Description Modifier [id number] - WC PHYS LEVEL 3 - EST PT ICD-10 Diagnosis Description [id number] Laceration without foreign body of right upper arm, initial encounter Z79.01 Long term (current) use of anticoagulants Quantity: 1 Electronic Signature(s) Signed: 04/04/2022 3:47:02 PM By: Levora Dredge Signed: 04/04/2022 4:44:08 PM By: Linton Ham MD Entered By: Levora Dredge on 04/04/2022 15:47:02

## 2022-04-08 ENCOUNTER — Encounter (HOSPITAL_BASED_OUTPATIENT_CLINIC_OR_DEPARTMENT_OTHER): Payer: Medicare Other | Admitting: Internal Medicine

## 2022-04-08 DIAGNOSIS — I7389 Other specified peripheral vascular diseases: Secondary | ICD-10-CM | POA: Diagnosis not present

## 2022-04-08 DIAGNOSIS — J449 Chronic obstructive pulmonary disease, unspecified: Secondary | ICD-10-CM | POA: Diagnosis not present

## 2022-04-08 DIAGNOSIS — Z7901 Long term (current) use of anticoagulants: Secondary | ICD-10-CM

## 2022-04-08 DIAGNOSIS — S41111A Laceration without foreign body of right upper arm, initial encounter: Secondary | ICD-10-CM | POA: Diagnosis not present

## 2022-04-08 NOTE — Progress Notes (Signed)
ROBERTS, LARRALDE ([id number]) [id number] Page 1 of 7 Visit Report for 04/08/2022 Chief Complaint Document Details Patient Name: Date of Service: [name], Christopher IN L. 04/08/2022 11:30 A M Medical Record Number: [id number] Patient Account Number: [id number] Date of Birth/Sex: Treating RN: February 03, 1928 (89 y.o. Seward Meth Primary Care Provider: Harrel Lemon Other Clinician: Referring Provider: Treating Provider/Extender: Caffie Damme in Treatment: 1 Information Obtained from: Patient Chief Complaint Right arm skin tear Electronic Signature(s) Signed: 04/08/2022 1:05:58 PM By: Kalman Shan DO Entered By: Kalman Shan on 04/08/2022 12:54:45 -------------------------------------------------------------------------------- HPI Details Patient Name: Date of Service: [name], Christopher IN L. 04/08/2022 11:30 A M Medical Record Number: [id number] Patient Account Number: [id number] Date of Birth/Sex: Treating RN: 1928-04-01 (89 y.o. Seward Meth Primary Care Provider: Harrel Lemon Other Clinician: Referring Provider: Treating Provider/Extender: Caffie Damme in Treatment: 1 History of Present Illness HPI Description: 03/27/2022; patient presents for follow-up. He was seen yesterday in our clinic for the first time for a skin tear to his right arm. He was recently hospitalized on 03/18/2018 for a right carotid artery stent. During that hospitalization he developed a skin tear and has had difficulty controlling the bleeding. He was seen in our clinic yesterday and devitalized tissue was taken off and the site cauterized with silver nitrate. Despite this he continues to have active bleeding soaking through dressings. He is currently on Eliquis and aspirin. He states this is prescribed by his cardiologist. Daughter notes that patient appears more weak than usual. Vitals are overall stable with no tachycardia.  Patient is alert and oriented. 3/4 after the patient was here last week. She was cauterized but ended up in the hospital. During this admission the patient's wound wound dressing was not healed and she was discharged to return to clinic here. The original wound is on the right lateral arm. We noticed that there is a similar area on the left anterior arm which looks as though it seemed danger of bleeding but is not currently bleeding. The patient is on Eliquis. 3/7; this is a patient I saw earlier this week. Predominantly for a wound on the right anterior lower extremity. In passing we noted that she had a hematoma on the left we wrapped the arm. Unfortunately it is not held together. We used a #5 curette to remove the underlying bleeding. Fortunately this stopped. The area on the right lower extremity appears better 3/11; he has been using Xeroform to the wound beds without issue. [name], [name] ([id number]) [id number] Page 2 of 7 Electronic Signature(s) Signed: 04/08/2022 1:05:58 PM By: Kalman Shan DO Entered By: Kalman Shan on 04/08/2022 12:55:36 -------------------------------------------------------------------------------- Physical Exam Details Patient Name: Date of Service: [name], Christopher IN L. 04/08/2022 11:30 A M Medical Record Number: [id number] Patient Account Number: [id number] Date of Birth/Sex: Treating RN: 11-Sep-1928 (89 y.o. Seward Meth Primary Care Provider: Harrel Lemon Other Clinician: Referring Provider: Treating Provider/Extender: Caffie Damme in Treatment: 1 Constitutional . Psychiatric . Notes T the right arm there is a wound with granulation tissue mostly epithelization to the edges circumferentially. T the left arm there is an open wound with o o granulation tissue. No signs of hematoma at this time. Electronic Signature(s) Signed: 04/08/2022 1:05:58 PM By: Kalman Shan DO Entered By:  Kalman Shan on 04/08/2022 12:56:33 -------------------------------------------------------------------------------- Physician Orders Details Patient Name: Date of Service: [name], Christopher IN L. 04/08/2022 11:30 A M Medical Record Number: [id number] Patient Account Number: [id number] Date  of Birth/Sex: Treating RN: Oct 03, 1928 (89 y.o. Seward Meth Primary Care Provider: Harrel Lemon Other Clinician: Referring Provider: Treating Provider/Extender: Caffie Damme in Treatment: 1 Verbal / Phone Orders: No Diagnosis Coding Follow-up Appointments Return Appointment in 1 week. Nurse Visit as needed - Thursday for dressing change Bathing/ Shower/ Hygiene Wound #1 Right Upper Arm Clean wound with Normal Saline or wound cleanser. Wash wounds with antibacterial soap and water. [name], [name] ([id number]) [id number] Page 3 of 7 May shower; gently cleanse wound with antibacterial soap, rinse and pat dry prior to dressing wounds No tub bath. Wound Treatment Wound #1 - Upper Arm Wound Laterality: Right Cleanser: Normal Saline 2 x Per Week/30 Days Discharge Instructions: Wash your hands with soap and water. Remove old dressing, discard into plastic bag and place into trash. Cleanse the wound with Normal Saline prior to applying a clean dressing using gauze sponges, not tissues or cotton balls. Do not scrub or use excessive force. Pat dry using gauze sponges, not tissue or cotton balls. Prim Dressing: Xeroform 4x4-HBD (in/in) 2 x Per Week/30 Days ary Discharge Instructions: Apply Xeroform 4x4-HBD (in/in) as directed Secondary Dressing: ABD Pad 5x9 (in/in) 2 x Per Week/30 Days Discharge Instructions: Cover with ABD pad Secondary Dressing: Conforming Guaze Roll-Medium 2 x Per Week/30 Days Discharge Instructions: Apply Conforming Stretch Guaze Bandage as directed Secured With: Stretch Net Dressing, Latex-free, Size 5, Small-Head /  Shoulder / Thigh 2 x Per Week/30 Days Wound #2 - Forearm Wound Laterality: Left Cleanser: Normal Saline 2 x Per Week/30 Days Discharge Instructions: Wash your hands with soap and water. Remove old dressing, discard into plastic bag and place into trash. Cleanse the wound with Normal Saline prior to applying a clean dressing using gauze sponges, not tissues or cotton balls. Do not scrub or use excessive force. Pat dry using gauze sponges, not tissue or cotton balls. Prim Dressing: Xeroform 4x4-HBD (in/in) 2 x Per Week/30 Days ary Discharge Instructions: Apply Xeroform 4x4-HBD (in/in) as directed Secondary Dressing: ABD Pad 5x9 (in/in) 2 x Per Week/30 Days Discharge Instructions: Cover with ABD pad Secondary Dressing: Conforming Guaze Roll-Medium 2 x Per Week/30 Days Discharge Instructions: Apply Conforming Stretch Guaze Bandage as directed Secured With: Stretch Net Dressing, Latex-free, Size 5, Small-Head / Shoulder / Thigh 2 x Per Week/30 Days Electronic Signature(s) Signed: 04/08/2022 1:05:58 PM By: Kalman Shan DO Entered By: Kalman Shan on 04/08/2022 12:57:48 -------------------------------------------------------------------------------- Problem List Details Patient Name: Date of Service: [name], Christopher IN L. 04/08/2022 11:30 A M Medical Record Number: [id number] Patient Account Number: [id number] Date of Birth/Sex: Treating RN: 11-16-1928 (89 y.o. Seward Meth Primary Care Provider: Harrel Lemon Other Clinician: Referring Provider: Treating Provider/Extender: Caffie Damme in Treatment: 1 Active Problems ICD-10 Encounter Code Description Active Date MDM Diagnosis S41.111A Laceration without foreign body of right upper arm, initial encounter 03/26/2022 No Yes [name], [name] ([id number]) [id number] Page 4 of 7 Z79.01 Long term (current) use of anticoagulants 03/26/2022 No Yes I73.89 Other specified peripheral  vascular diseases 03/26/2022 No Yes J44.9 Chronic obstructive pulmonary disease, unspecified 03/26/2022 No Yes Inactive Problems Resolved Problems Electronic Signature(s) Signed: 04/08/2022 1:05:58 PM By: Kalman Shan DO Entered By: Kalman Shan on 04/08/2022 12:54:41 -------------------------------------------------------------------------------- Progress Note Details Patient Name: Date of Service: [name], Christopher IN L. 04/08/2022 11:30 A M Medical Record Number: [id number] Patient Account Number: [id number] Date of Birth/Sex: Treating RN: 1928-06-26 (89 y.o. Seward Meth Primary Care Provider: Harrel Lemon Other Clinician: Referring Provider: Treating  Provider/Extender: Caffie Damme in Treatment: 1 Subjective Chief Complaint Information obtained from Patient Right arm skin tear History of Present Illness (HPI) 03/27/2022; patient presents for follow-up. He was seen yesterday in our clinic for the first time for a skin tear to his right arm. He was recently hospitalized on 03/18/2018 for a right carotid artery stent. During that hospitalization he developed a skin tear and has had difficulty controlling the bleeding. He was seen in our clinic yesterday and devitalized tissue was taken off and the site cauterized with silver nitrate. Despite this he continues to have active bleeding soaking through dressings. He is currently on Eliquis and aspirin. He states this is prescribed by his cardiologist. Daughter notes that patient appears more weak than usual. Vitals are overall stable with no tachycardia. Patient is alert and oriented. 3/4 after the patient was here last week. She was cauterized but ended up in the hospital. During this admission the patient's wound wound dressing was not healed and she was discharged to return to clinic here. The original wound is on the right lateral arm. We noticed that there is a similar area on the left anterior arm which  looks as though it seemed danger of bleeding but is not currently bleeding. The patient is on Eliquis. 3/7; this is a patient I saw earlier this week. Predominantly for a wound on the right anterior lower extremity. In passing we noted that she had a hematoma on the left we wrapped the arm. Unfortunately it is not held together. We used a #5 curette to remove the underlying bleeding. Fortunately this stopped. The area on the right lower extremity appears better 3/11; he has been using Xeroform to the wound beds without issue. Objective [name], [name] ([id number]) [id number] Page 5 of 7 Constitutional Vitals Time Taken: 11:58 AM, Height: 69 in, Weight: 138 lbs, BMI: 20.4, Temperature: 98.1 F, Pulse: 78 bpm, Respiratory Rate: 18 breaths/min, Blood Pressure: 101/53 mmHg. General Notes: T the right arm there is a wound with granulation tissue mostly epithelization to the edges circumferentially. T the left arm there is an open o o wound with granulation tissue. No signs of hematoma at this time. Integumentary (Hair, Skin) Wound #1 status is Open. Original cause of wound was Trauma. The date acquired was: 03/20/2022. The wound has been in treatment 1 weeks. The wound is located on the Right Upper Arm. The wound measures 2.3cm length x 1.9cm width x 0.1cm depth; 3.432cm^2 area and 0.343cm^3 volume. There is Fat Layer (Subcutaneous Tissue) exposed. There is a medium amount of serosanguineous drainage noted. The wound margin is distinct with the outline attached to the wound base. There is medium (34-66%) pink, pale granulation within the wound bed. There is a medium (34-66%) amount of necrotic tissue within the wound bed including Adherent Slough. Wound #2 status is Open. Original cause of wound was Trauma. The date acquired was: 03/28/2022. The wound is located on the Left Forearm. The wound measures 0.7cm length x 0.4cm width x 0.1cm depth; 0.22cm^2 area and 0.022cm^3  volume. There is Fat Layer (Subcutaneous Tissue) exposed. There is a medium amount of serosanguineous drainage noted. There is small (1-33%) red granulation within the wound bed. There is a large (67-100%) amount of necrotic tissue within the wound bed including Eschar. Assessment Active Problems ICD-10 Laceration without foreign body of right upper arm, initial encounter Long term (current) use of anticoagulants Other specified peripheral vascular diseases Chronic obstructive pulmonary disease, unspecified [name] wounds appear well-healing.  I recommended continuing Xeroform. Patient does not have anyone to change the dressings. He follows up for nurse visits in our clinic to have the dressing changed at least 3 times weekly. Plan Follow-up Appointments: Return Appointment in 1 week. Nurse Visit as needed - Thursday for dressing change Bathing/ Shower/ Hygiene: Wound #1 Right Upper Arm: Clean wound with Normal Saline or wound cleanser. Wash wounds with antibacterial soap and water. May shower; gently cleanse wound with antibacterial soap, rinse and pat dry prior to dressing wounds No tub bath. WOUND #1: - Upper Arm Wound Laterality: Right Cleanser: Normal Saline 2 x Per Week/30 Days Discharge Instructions: Wash your hands with soap and water. Remove old dressing, discard into plastic bag and place into trash. Cleanse the wound with Normal Saline prior to applying a clean dressing using gauze sponges, not tissues or cotton balls. Do not scrub or use excessive force. Pat dry using gauze sponges, not tissue or cotton balls. Prim Dressing: Xeroform 4x4-HBD (in/in) 2 x Per Week/30 Days ary Discharge Instructions: Apply Xeroform 4x4-HBD (in/in) as directed Secondary Dressing: ABD Pad 5x9 (in/in) 2 x Per Week/30 Days Discharge Instructions: Cover with ABD pad Secondary Dressing: Conforming Guaze Roll-Medium 2 x Per Week/30 Days Discharge Instructions: Apply Conforming Stretch Guaze  Bandage as directed Secured With: Stretch Net Dressing, Latex-free, Size 5, Small-Head / Shoulder / Thigh 2 x Per Week/30 Days WOUND #2: - Forearm Wound Laterality: Left Cleanser: Normal Saline 2 x Per Week/30 Days Discharge Instructions: Wash your hands with soap and water. Remove old dressing, discard into plastic bag and place into trash. Cleanse the wound with Normal Saline prior to applying a clean dressing using gauze sponges, not tissues or cotton balls. Do not scrub or use excessive force. Pat dry using gauze sponges, not tissue or cotton balls. Prim Dressing: Xeroform 4x4-HBD (in/in) 2 x Per Week/30 Days ary Discharge Instructions: Apply Xeroform 4x4-HBD (in/in) as directed Secondary Dressing: ABD Pad 5x9 (in/in) 2 x Per Week/30 Days Discharge Instructions: Cover with ABD pad Secondary Dressing: Conforming Guaze Roll-Medium 2 x Per Week/30 Days Discharge Instructions: Apply Conforming Stretch Guaze Bandage as directed Secured With: Stretch Net Dressing, Latex-free, Size 5, Small-Head / Shoulder / Thigh 2 x Per Week/30 Days 1. Xeroform 2. Nursing 3. Follow-up in 1 week for provider visit Electronic Signature(s) Signed: 04/08/2022 1:05:58 PM By: Rosebud Poles, Callen L [phone] By: Kalman Shan DO [id number] Page 6 of 7 Signed: 04/08/2022 1:05:58 Entered By: Kalman Shan on 04/08/2022 12:57:29 -------------------------------------------------------------------------------- ROS/PFSH Details Patient Name: Date of Service: [name], Christopher IN L. 04/08/2022 11:30 A M Medical Record Number: [id number] Patient Account Number: [id number] Date of Birth/Sex: Treating RN: [birth year] (89 y.o. 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 Past Medical History Notes: covid chronic respiratory failure aspiration pneumonia Cardiovascular Medical History: Positive for: Arrhythmia - paroxysmal atrial fibrillation; Congestive Heart Failure; Coronary Artery Disease; Hypertension; Peripheral Arterial Disease - aortobifemoral bypass Past Medical History Notes: murmur hyperlipidemia thoracic aortic ectasia carotid stenosis Oncologic Medical History: Past Medical History Notes: basal cell carcinoma Immunizations Pneumococcal Vaccine: Received Pneumococcal Vaccination: Yes Received Pneumococcal Vaccination On or After 60th Birthday: Yes Tetanus Vaccine: Last tetanus shot: 02/27/2021 Implantable Devices No devices added Hospitalization / Surgery History  Type of Hospitalization/Surgery carotid stent 03/18/2022 RIGHT femoral endarterectomy 07/2020 renal bypass 2005 eye surgery 2005 [name], [name] ([id number]) [id number] Page 7 of 7 Family and Social History Former smoker Engineer, maintenance) Signed: 04/08/2022 1:05:58 PM By: Kalman Shan DO Signed: 04/08/2022 1:41:55 PM By: Rosalio Loud MSN RN CNS WTA Entered By: Kalman Shan on 04/08/2022 12:58:00 -------------------------------------------------------------------------------- SuperBill Details Patient Name: Date of Service: [name], Christopher IN L. 04/08/2022 Medical Record Number: [id number] Patient Account Number: [id number] Date of Birth/Sex: Treating RN: 1928/11/20 (89 y.o. Seward Meth Primary Care Provider: Harrel Lemon Other Clinician: Referring Provider: Treating Provider/Extender: Caffie Damme in Treatment: 1 Diagnosis Coding ICD-10 Codes Code Description [phone] Laceration without foreign body of  right upper arm, initial encounter Z79.01 Long term (current) use of anticoagulants I73.89 Other specified peripheral vascular diseases J44.9 Chronic obstructive pulmonary disease, unspecified Facility Procedures : CPT4 Code: [id number] Description: 99213 - WOUND CARE VISIT-LEV 3 EST PT Modifier: Quantity: 1 Physician Procedures : CPT4 Code Description Modifier [id number] - WC PHYS LEVEL 3 - EST PT ICD-10 Diagnosis Description S41.111A Laceration without foreign body of right upper arm, initial encounter Z79.01 Long term (current) use of anticoagulants I73.89 Other specified  peripheral vascular diseases J44.9 Chronic obstructive pulmonary disease, unspecified Quantity: 1 Electronic Signature(s) Signed: 04/08/2022 1:05:58 PM By: Kalman Shan DO Entered By: Kalman Shan on 04/08/2022 12:57:40

## 2022-04-08 NOTE — Progress Notes (Signed)
DASHTON, CALDARELLI ([id number]) [id number] Page 1 of 11 Visit Report for 04/08/2022 Arrival Information Details Patient Name: Date of Service: [name], Christopher IN L. 04/08/2022 11:30 A M Medical Record Number: [id number] Patient Account Number: [id number] Date of Birth/Sex: Treating RN: 10-08-1928 (89 y.o. Seward Meth Primary Care [name]: Harrel Lemon Other Clinician: Referring Monish Haliburton: Treating Ingram Onnen/Extender: Caffie Damme in Treatment: 1 Visit Information History Since Last Visit Added or deleted any medications: No Patient Arrived: Wheel Chair Has Dressing in Place as Prescribed: Yes Arrival Time: 11:52 Pain Present Now: No Accompanied By: daughter Transfer Assistance: None Patient Identification Verified: Yes Secondary Verification Process Completed: Yes Patient Has Alerts: Yes Patient Alerts: Patient on Blood Thinner '81mg'$  aspirin Eliquis NOT DIABETIC Electronic Signature(s) Signed: 04/08/2022 1:41:55 PM By: Rosalio Loud MSN RN CNS WTA Entered By: Rosalio Loud on 04/08/2022 11:53:11 -------------------------------------------------------------------------------- Clinic Level of Care Assessment Details Patient Name: Date of Service: [name], Christopher IN L. 04/08/2022 11:30 A M Medical Record Number: [id number] Patient Account Number: [id number] Date of Birth/Sex: Treating RN: 1928/11/04 (89 y.o. Seward Meth Primary Care [name]: Harrel Lemon Other Clinician: Referring Kadeidra Coryell: Treating [name]/Extender: Caffie Damme in Treatment: 1 Clinic Level of Care Assessment Items TOOL 4 Quantity Score X- 1 0 Use when only an EandM is performed on FOLLOW-UP visit ASSESSMENTS - Nursing Assessment / Reassessment X- 1 10 Reassessment of Co-morbidities (includes updates in patient status) X- 1 5 Reassessment of Adherence to Treatment Plan ASSESSMENTS - Wound and Skin A ssessment /  Reassessment '[]'$  - 0 Simple Wound Assessment / Reassessment - one wound [name], [name] ([id number] Page 2 of 11 X- 2 5 Complex Wound Assessment / Reassessment - multiple wounds '[]'$  - 0 Dermatologic / Skin Assessment (not related to wound area) ASSESSMENTS - Focused Assessment '[]'$  - 0 Circumferential Edema Measurements - multi extremities '[]'$  - 0 Nutritional Assessment / Counseling / Intervention '[]'$  - 0 Lower Extremity Assessment (monofilament, tuning fork, pulses) '[]'$  - 0 Peripheral Arterial Disease Assessment (using hand held doppler) ASSESSMENTS - Ostomy and/or Continence Assessment and Care '[]'$  - 0 Incontinence Assessment and Management '[]'$  - 0 Ostomy Care Assessment and Management (repouching, etc.) PROCESS - Coordination of Care '[]'$  - 0 Simple Patient / Family Education for ongoing care '[]'$  - 0 Complex (extensive) Patient / Family Education for ongoing care X- 1 10 Staff obtains Programmer, systems, Records, T Results / Process Orders est '[]'$  - 0 Staff telephones HHA, Nursing Homes / Clarify orders / etc '[]'$  - 0 Routine Transfer to another Facility (non-emergent condition) '[]'$  - 0 Routine Hospital Admission (non-emergent condition) '[]'$  - 0 New Admissions / Biomedical engineer / Ordering NPWT Apligraf, etc. , '[]'$  - 0 Emergency Hospital Admission (emergent condition) X- 1 10 Simple Discharge Coordination '[]'$  - 0 Complex (extensive) Discharge Coordination PROCESS - Special Needs '[]'$  - 0 Pediatric / Minor Patient Management '[]'$  - 0 Isolation Patient Management '[]'$  - 0 Hearing / Language / Visual special needs '[]'$  - 0 Assessment of Community assistance (transportation, D/C planning, etc.) '[]'$  - 0 Additional assistance / Altered mentation '[]'$  - 0 Support Surface(s) Assessment (bed, cushion, seat, etc.) INTERVENTIONS - Wound Cleansing / Measurement '[]'$  - 0 Simple Wound Cleansing - one wound X- 2 5 Complex Wound Cleansing - multiple wounds X-  1 5 Wound Imaging (photographs - any number of wounds) '[]'$  - 0 Wound Tracing (instead of photographs) '[]'$  - 0 Simple Wound Measurement - one wound X- 2 5 Complex Wound Measurement -  multiple wounds INTERVENTIONS - Wound Dressings '[]'$  - 0 Small Wound Dressing one or multiple wounds X- 2 15 Medium Wound Dressing one or multiple wounds '[]'$  - 0 Large Wound Dressing one or multiple wounds '[]'$  - 0 Application of Medications - topical '[]'$  - 0 Application of Medications - injection INTERVENTIONS - Miscellaneous '[]'$  - 0 External ear exam '[]'$  - 0 Specimen Collection (cultures, biopsies, blood, body fluids, etc.) [name], [name] ([id number] Page 3 of 11 '[]'$  - 0 Specimen(s) / Culture(s) sent or taken to Lab for analysis '[]'$  - 0 Patient Transfer (multiple staff / Civil Service fast streamer / Similar devices) '[]'$  - 0 Simple Staple / Suture removal (25 or less) '[]'$  - 0 Complex Staple / Suture removal (26 or more) '[]'$  - 0 Hypo / Hyperglycemic Management (close monitor of Blood Glucose) '[]'$  - 0 Ankle / Brachial Index (ABI) - do not check if billed separately X- 1 5 Vital Signs Has the patient been seen at the hospital within the last three years: Yes Total Score: 105 Level Of Care: New/Established - Level 3 Electronic Signature(s) Signed: 04/08/2022 1:41:55 PM By: Rosalio Loud MSN RN CNS WTA Entered By: Rosalio Loud on 04/08/2022 12:14:22 -------------------------------------------------------------------------------- Encounter Discharge Information Details Patient Name: Date of Service: [name], Christopher IN L. 04/08/2022 11:30 A M Medical Record Number: [id number] Patient Account Number: [id number] Date of Birth/Sex: Treating RN: [birth year] (89 y.o. Seward Meth Primary Care Korie Streat: Harrel Lemon Other Clinician: Referring [name]: Treating Latavious Bitter/Extender: Caffie Damme in Treatment: 1 Encounter Discharge Information Items Discharge  Condition: Stable Ambulatory Status: Wheelchair Discharge Destination: Home Transportation: Private Auto Accompanied By: daughter Schedule Follow-up Appointment: Yes Clinical Summary of Care: Electronic Signature(s) Signed: 04/08/2022 1:41:55 PM By: Rosalio Loud MSN RN CNS WTA Entered By: Rosalio Loud on 04/08/2022 12:16:12 -------------------------------------------------------------------------------- Lower Extremity Assessment Details Patient Name: Date of Service: [name], Christopher IN L. 04/08/2022 11:30 A M Medical Record Number: [id number] Patient Account Number: [id number] Date of Birth/Sex: Treating RN: 05-Mar-1928 (89 y.o. Johnnye, Sentz, Prescott Carlean Jews ([id number]) [phone] Page 4 of 11 Primary Care Damione Robideau: Harrel Lemon Other Clinician: Referring Ceara Wrightson: Treating Azan Maneri/Extender: Caffie Damme in Treatment: 1 Electronic Signature(s) Signed: 04/08/2022 1:41:55 PM By: Rosalio Loud MSN RN CNS WTA Entered By: Rosalio Loud on 04/08/2022 12:09:04 -------------------------------------------------------------------------------- Multi Wound Chart Details Patient Name: Date of Service: Christopher [name], Christopher IN L. 04/08/2022 11:30 A M Medical Record Number: [id number] Patient Account Number: [id number] Date of Birth/Sex: Treating RN: 1928-05-29 (89 y.o. Seward Meth Primary Care Bettyanne Dittman: Harrel Lemon Other Clinician: Referring Raye Slyter: Treating Kieren Adkison/Extender: Caffie Damme in Treatment: 1 Vital Signs Height(in): 83 Pulse(bpm): 10 Weight(lbs): 138 Blood Pressure(mmHg): 101/53 Body Mass Index(BMI): 20.4 Temperature(F): 98.1 Respiratory Rate(breaths/min): 18 [1:Photos:] [N/A:N/A] Right Upper Arm Left Forearm N/A Wound Location: Trauma Trauma N/A Wounding Event: Skin Tear Trauma, Other N/A Primary Etiology: Chronic Obstructive Pulmonary Chronic Obstructive Pulmonary N/A Comorbid  History: Disease (COPD), Arrhythmia, Disease (COPD), Arrhythmia, Congestive Heart Failure, Coronary Congestive Heart Failure, Coronary Artery Disease, Hypertension, Artery Disease, Hypertension, Peripheral Arterial Disease Peripheral Arterial Disease 03/20/2022 03/28/2022 N/A Date Acquired: 1 0 N/A Weeks of Treatment: Open Open N/A Wound Status: No No N/A Wound Recurrence: 2.3x1.9x0.1 0.7x0.4x0.1 N/A Measurements L x W x D (cm) 3.432 0.22 N/A A (cm) : rea 0.343 0.022 N/A Volume (cm) : 80.10% 82.50% N/A % Reduction in A rea: 80.20% 82.50% N/A % Reduction in Volume: Partial Thickness Partial Thickness N/A Classification: Medium Medium N/A Exudate  A mount: Serosanguineous Serosanguineous N/A Exudate Type: red, brown red, brown N/A Exudate Color: Distinct, outline attached N/A N/A Wound Margin: Medium (34-66%) Small (1-33%) N/A Granulation A mount: Pink, Pale Red N/A Granulation Quality: Medium (34-66%) Large (67-100%) N/A Necrotic A mount: Adherent Slough Eschar N/A Necrotic Tissue: Fat Layer (Subcutaneous Tissue): Yes Fat Layer (Subcutaneous Tissue): Yes N/A Exposed Structures: Fascia: No Gargus, Iyan L ([id number] Page 5 of 11 Tendon: No Muscle: No Joint: No Bone: No Small (1-33%) None N/A Epithelialization: Treatment Notes Electronic Signature(s) Signed: 04/08/2022 1:41:55 PM By: Rosalio Loud MSN RN CNS WTA Entered By: Rosalio Loud on 04/08/2022 12:12:50 -------------------------------------------------------------------------------- Multi-Disciplinary Care Plan Details Patient Name: Date of Service: [name], Christopher IN L. 04/08/2022 11:30 A M Medical Record Number: [id number] Patient Account Number: [id number] Date of Birth/Sex: Treating RN: Jun 12, 1928 (89 y.o. Seward Meth Primary Care Arye Weyenberg: Harrel Lemon Other Clinician: Referring Ronzell Laban: Treating Jamiya Nims/Extender: Caffie Damme in Treatment: 1 Active Inactive Abuse / Safety / Falls / Self Care Management Nursing Diagnoses: Potential for falls Goals: Patient will remain injury free related to falls Date Initiated: 03/27/2022 Target Resolution Date: 03/27/2022 Goal Status: Active Patient/caregiver will verbalize understanding of skin care regimen Date Initiated: 03/27/2022 Date Inactivated: 04/04/2022 Target Resolution Date: 03/27/2022 Goal Status: Met Patient/caregiver will verbalize/demonstrate measure taken to improve self care Date Initiated: 03/27/2022 Target Resolution Date: 03/27/2022 Goal Status: Active Patient/caregiver will verbalize/demonstrate measures taken to improve the patient's personal safety Date Initiated: 03/27/2022 Target Resolution Date: 03/27/2022 Goal Status: Active Interventions: Podiatry chair, stretcher in low position and side rails up as needed Notes: Necrotic Tissue Nursing Diagnoses: Impaired tissue integrity related to necrotic/devitalized tissue Knowledge deficit related to management of necrotic/devitalized tissue Goals: Necrotic/devitalized tissue will be minimized in the wound bed Date Initiated: 03/27/2022 Target Resolution Date: 03/27/2022 Goal Status: Active Patient/caregiver will verbalize understanding of reason and process for debridement of necrotic tissue Date Initiated: 03/27/2022 Target Resolution Date: 03/27/2022 Goal Status: Active JAKYREE, ERICKSEN ([id number]) [phone].pdf Page 6 of 11 Interventions: Assess patient pain level pre-, during and post procedure and prior to discharge Provide education on necrotic tissue and debridement process Treatment Activities: Apply topical anesthetic as ordered : 03/27/2022 Notes: Wound/Skin Impairment Nursing Diagnoses: Impaired tissue integrity Goals: Patient/caregiver will verbalize understanding of skin care regimen Date Initiated: 03/27/2022 Date Inactivated: 04/04/2022 Target  Resolution Date: 03/28/2022 Goal Status: Met Ulcer/skin breakdown will have a volume reduction of 30% by week 4 Date Initiated: 03/27/2022 Target Resolution Date: 04/24/2022 Goal Status: Active Ulcer/skin breakdown will have a volume reduction of 50% by week 8 Date Initiated: 03/27/2022 Target Resolution Date: 05/22/2022 Goal Status: Active Ulcer/skin breakdown will have a volume reduction of 80% by week 12 Date Initiated: 03/27/2022 Target Resolution Date: 06/19/2022 Goal Status: Active Ulcer/skin breakdown will heal within 14 weeks Date Initiated: 03/27/2022 Target Resolution Date: 07/03/2022 Goal Status: Active Interventions: Assess patient/caregiver ability to obtain necessary supplies Assess patient/caregiver ability to perform ulcer/skin care regimen upon admission and as needed Assess ulceration(s) every visit Treatment Activities: Referred to DME Jalise Zawistowski for dressing supplies : 03/27/2022 Topical wound management initiated : 03/27/2022 Notes: Electronic Signature(s) Signed: 04/08/2022 1:41:55 PM By: Rosalio Loud MSN RN CNS WTA Entered By: Rosalio Loud on 04/08/2022 12:14:35 -------------------------------------------------------------------------------- Pain Assessment Details Patient Name: Date of Service: [name], Christopher IN L. 04/08/2022 11:30 A M Medical Record Number: [id number] Patient Account Number: [id number] Date of Birth/Sex: Treating RN: 04-Mar-1928 (89 y.o. Seward Meth Primary Care Jonluke Cobbins: Harrel Lemon Other Clinician: Referring  Jaryn Rosko: Treating Shalev Helminiak/Extender: Caffie Damme in Treatment: 1 Active Problems Location of Pain Severity and Description of Pain Patient Has Paino Yes Site Locations Pain LocationKEDARIUS, VERBA ([id number]) [phone].pdf Page 7 of 11 Pain Location: Pain in Ulcers With Dressing Change: Yes Duration of the Pain. Constant / Intermittento Constant Rate the pain. Current Pain  Level: 5 Worst Pain Level: 8 Least Pain Level: 2 Tolerable Pain Level: 3 Pain Management and Medication Current Pain Management: Electronic Signature(s) Signed: 04/08/2022 1:41:55 PM By: Rosalio Loud MSN RN CNS WTA Entered By: Rosalio Loud on 04/08/2022 11:59:48 -------------------------------------------------------------------------------- Patient/Caregiver Education Details Patient Name: Date of Service: [name], Christopher IN L. 3/11/2024andnbsp11:30 A M Medical Record Number: [id number] Patient Account Number: [id number] Date of Birth/Gender: Treating RN: May 11, 1928 (89 y.o. Seward Meth Primary Care Physician: Harrel Lemon Other Clinician: Referring Physician: Treating Physician/Extender: Caffie Damme in Treatment: 1 Education Assessment Education Provided To: Patient Education Topics Provided Wound/Skin Impairment: Handouts: Caring for Your Ulcer Methods: Explain/Verbal Responses: State content correctly Electronic Signature(s) Signed: 04/08/2022 1:41:55 PM By: Rosalio Loud MSN RN CNS WTA Entered By: Rosalio Loud on 04/08/2022 12:14:52 [name] ([id number] Page 8 of 11 -------------------------------------------------------------------------------- Wound Assessment Details Patient Name: Date of Service: [name], Christopher IN L. 04/08/2022 11:30 A M Medical Record Number: [id number] Patient Account Number: [id number] Date of Birth/Sex: Treating RN: [birth year] (89 y.o. Seward Meth Primary Care Caliope Ruppert: Harrel Lemon Other Clinician: Referring Lemmie Steinhaus: Treating Pattijo Juste/Extender: Caffie Damme in Treatment: 1 Wound Status Wound Number: 1 Primary Skin T ear Etiology: Wound Location: Right Upper Arm Wound Open Wounding Event: Trauma Status: Date Acquired: 03/20/2022 Comorbid Chronic Obstructive Pulmonary Disease (COPD), Arrhythmia, Weeks Of Treatment: 1 History:  Congestive Heart Failure, Coronary Artery Disease, Hypertension, Clustered Wound: No Peripheral Arterial Disease Photos Wound Measurements Length: (cm) 2.3 Width: (cm) 1.9 Depth: (cm) 0.1 Area: (cm) 3.432 Volume: (cm) 0.343 % Reduction in Area: 80.1% % Reduction in Volume: 80.2% Epithelialization: Small (1-33%) Wound Description Classification: Partial Thickness Wound Margin: Distinct, outline attached Exudate Amount: Medium Exudate Type: Serosanguineous Exudate Color: red, brown Foul Odor After Cleansing: No Slough/Fibrino Yes Wound Bed Granulation Amount: Medium (34-66%) Exposed Structure Granulation Quality: Pink, Pale Fascia Exposed: No Necrotic Amount: Medium (34-66%) Fat Layer (Subcutaneous Tissue) Exposed: Yes Necrotic Quality: Adherent Slough Tendon Exposed: No Muscle Exposed: No Joint Exposed: No Bone Exposed: No Treatment Notes Wound #1 (Upper Arm) Wound Laterality: Right Cleanser Normal Saline Discharge Instruction: Wash your hands with soap and water. Remove old dressing, discard into plastic bag and place into trash. Cleanse the wound with Normal Saline prior to applying a clean dressing using gauze sponges, not tissues or cotton balls. Do not scrub or use excessive GUILFORD, SPROLES L ([id number]) [phone].pdf Page 9 of 11 force. Pat dry using gauze sponges, not tissue or cotton balls. Peri-Wound Care Topical Primary Dressing Xeroform 4x4-HBD (in/in) Discharge Instruction: Apply Xeroform 4x4-HBD (in/in) as directed Secondary Dressing ABD Pad 5x9 (in/in) Discharge Instruction: Cover with ABD pad [place] Roll-Medium Discharge Instruction: Apply Conforming Stretch Guaze Bandage as directed Secured With [employer] Dressing, Latex-free, Size 5, Small-Head / Shoulder / Thigh Compression Wrap Compression Stockings Add-Ons Electronic Signature(s) Signed: 04/08/2022 1:41:55 PM By: Rosalio Loud MSN RN CNS WTA Entered By:  Rosalio Loud on 04/08/2022 12:07:08 -------------------------------------------------------------------------------- Wound Assessment Details Patient Name: Date of Service: [name], Christopher IN L. 04/08/2022 11:30 A M Medical Record Number: [id number] Patient Account Number: [id number] Date of Birth/Sex: Treating  RN: 1928/04/05 (89 y.o. Seward Meth Primary Care Mcgwire Dasaro: Harrel Lemon Other Clinician: Referring Cope Marte: Treating Raenah Murley/Extender: Caffie Damme in Treatment: 1 Wound Status Wound Number: 2 Primary Trauma, Other Etiology: Wound Location: Left Forearm Wound Open Wounding Event: Trauma Status: Date Acquired: 03/28/2022 Notes: pt wasnt sure, states may have hit it on something Weeks Of Treatment: 0 Comorbid Chronic Obstructive Pulmonary Disease (COPD), Arrhythmia, Clustered Wound: No History: Congestive Heart Failure, Coronary Artery Disease, Hypertension, Peripheral Arterial Disease Photos Wound Measurements Burkle, Jimie L ([id number]) Length: (cm) 0.7 Width: (cm) 0.4 Depth: (cm) 0.1 Area: (cm) 0.22 Volume: (cm) 0.022 [id number] Page 10 of 11 % Reduction in Area: 82.5% % Reduction in Volume: 82.5% Epithelialization: None Wound Description Classification: Partial Thickness Exudate Amount: Medium Exudate Type: Serosanguineous Exudate Color: red, brown Foul Odor After Cleansing: No Slough/Fibrino Yes Wound Bed Granulation Amount: Small (1-33%) Exposed Structure Granulation Quality: Red Fat Layer (Subcutaneous Tissue) Exposed: Yes Necrotic Amount: Large (67-100%) Necrotic Quality: Eschar Treatment Notes Wound #2 (Forearm) Wound Laterality: Left Cleanser Normal Saline Discharge Instruction: Wash your hands with soap and water. Remove old dressing, discard into plastic bag and place into trash. Cleanse the wound with Normal Saline prior to applying a clean dressing using gauze sponges, not tissues  or cotton balls. Do not scrub or use excessive force. Pat dry using gauze sponges, not tissue or cotton balls. Peri-Wound Care Topical Primary Dressing Xeroform 4x4-HBD (in/in) Discharge Instruction: Apply Xeroform 4x4-HBD (in/in) as directed Secondary Dressing ABD Pad 5x9 (in/in) Discharge Instruction: Cover with ABD pad [place] Roll-Medium Discharge Instruction: Apply Conforming Stretch Guaze Bandage as directed Secured With [employer] Dressing, Latex-free, Size 5, Small-Head / Shoulder / Thigh Compression Wrap Compression Stockings Add-Ons Electronic Signature(s) Signed: 04/08/2022 1:41:55 PM By: Rosalio Loud MSN RN CNS WTA Entered By: Rosalio Loud on 04/08/2022 12:08:47 -------------------------------------------------------------------------------- Vitals Details Patient Name: Date of Service: [name], Christopher IN L. 04/08/2022 11:30 A M Medical Record Number: [id number] Patient Account Number: [id number] Date of Birth/Sex: Treating RN: 06-09-1928 (89 y.o. Seward Meth Primary Care Alyxandra Tenbrink: Harrel Lemon Other Clinician: Referring Acheron Sugg: Treating Carlene Bickley/Extender: Rayetta Humphrey [place], Ocie Cornfield ([id number]) [id number] Page 11 of 11 Weeks in Treatment: 1 Vital Signs Time Taken: 11:58 Temperature (F): 98.1 Height (in): 69 Pulse (bpm): 78 Weight (lbs): 138 Respiratory Rate (breaths/min): 18 Body Mass Index (BMI): 20.4 Blood Pressure (mmHg): 101/53 Reference Range: 80 - 120 mg / dl Electronic Signature(s) Signed: 04/08/2022 1:41:55 PM By: Rosalio Loud MSN RN CNS WTA Entered By: Rosalio Loud on 04/08/2022 11:59:19

## 2022-04-11 ENCOUNTER — Ambulatory Visit (INDEPENDENT_AMBULATORY_CARE_PROVIDER_SITE_OTHER): Payer: Medicare Other | Admitting: Nurse Practitioner

## 2022-04-11 ENCOUNTER — Encounter (INDEPENDENT_AMBULATORY_CARE_PROVIDER_SITE_OTHER): Payer: Self-pay | Admitting: Nurse Practitioner

## 2022-04-11 VITALS — BP 115/65 | HR 63 | Resp 16

## 2022-04-11 DIAGNOSIS — I6521 Occlusion and stenosis of right carotid artery: Secondary | ICD-10-CM

## 2022-04-11 DIAGNOSIS — I10 Essential (primary) hypertension: Secondary | ICD-10-CM

## 2022-04-11 DIAGNOSIS — I7025 Atherosclerosis of native arteries of other extremities with ulceration: Secondary | ICD-10-CM

## 2022-04-12 ENCOUNTER — Telehealth (INDEPENDENT_AMBULATORY_CARE_PROVIDER_SITE_OTHER): Payer: Self-pay

## 2022-04-12 ENCOUNTER — Encounter (INDEPENDENT_AMBULATORY_CARE_PROVIDER_SITE_OTHER): Payer: Self-pay | Admitting: Nurse Practitioner

## 2022-04-12 NOTE — H&P (View-Only) (Signed)
Subjective:    Patient ID: [name], male    DOB: [birth year], 89 y.o.   MRN: [id number] Chief Complaint  Patient presents with   Follow-up    Follow up with no studies     [name] is a 89 year old male who presents today for evaluation of left toe ulceration.  He has been working with podiatry and receiving excellent wound care but despite this the wound has continued to worsen and deteriorate.  The patient has known peripheral arterial disease.  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  He has also began to have some development of mild rest pain like symptoms.  There are no additional open wounds or ulcerations.  The patient recently had a right carotid stent placed which she currently has done well postintervention, with the exception of bleeding from an IV site post discharge.    Review of Systems  Skin:  Positive for wound.  All other systems reviewed and are negative.      Objective:   Physical Exam Vitals reviewed.  HENT:     Head: Normocephalic.  Cardiovascular:     Rate and Rhythm: Normal rate.  Pulmonary:     Effort: Pulmonary effort is normal.  Musculoskeletal:       Feet:  Feet:     Left foot:     Skin integrity: Ulcer present.  Neurological:     Mental Status: He is alert and oriented to person, place, and time.     Motor: Weakness present.     Gait: Gait abnormal.  Psychiatric:        Mood and Affect: Mood normal.        Behavior: Behavior normal.        Thought Content: Thought content normal.        Judgment: Judgment normal.     BP 115/65 (BP Location: Left Arm)   Pulse 63   Resp 16   Past Medical History:  Diagnosis Date   (HFpEF) heart failure with preserved ejection fraction (HCC)    Aortic atherosclerosis ([place])    Basal cell carcinoma 09/13/2021   Left cheek. 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   Thoracic aortic ectasia (HCC)    a.) measured 3.6 x 3.3 cm on 10/08/2019    Social History   Socioeconomic History   Marital status: Married    Spouse name: Not on file   Number of children: Not on file   Years of education: Not on file   Highest education level: Not on file  Occupational History   Not on file  Tobacco Use   Smoking status: Former    Packs/day: 1.00    Years: 30.00    Additional pack years: 0.00    Total pack years: 30.00    Types: Cigarettes    Quit date: 27    Years since quitting: 27.2   Smokeless tobacco: Never  Vaping Use   Vaping Use: Never used  Substance and Sexual Activity   Alcohol use: Never   Drug use: Never   Sexual activity: Not Currently  Other Topics Concern   Not on file  Social History Narrative   Lives at home alone with help from daughter.   Social Determinants of Health   Financial Resource Strain: Not on file  Food Insecurity: No Food Insecurity (03/27/2022)   Hunger Vital Sign    Worried About Running Out of Food in the Last Year: Never true    Ran Out of Food in the Last Year: Never true  Transportation Needs: No Transportation Needs (03/27/2022)   PRAPARE - Hydrologist (Medical): No    Lack of Transportation (Non-Medical): No  Physical Activity: Not on file  Stress: Not on file  Social Connections: Not on file  Intimate Partner Violence: Not At Risk (03/27/2022)   Humiliation, Afraid, Rape, and Kick questionnaire    Fear of Current or Ex-Partner: No    Emotionally Abused: No    Physically Abused: No    Sexually Abused: No    Past Surgical History:  Procedure Laterality  Date   CAROTID PTA/STENT INTERVENTION Right 03/18/2022   Procedure: CAROTID PTA/STENT INTERVENTION;  Surgeon: Algernon Huxley, MD;  Location: [facility] [facility];  Service: Cardiovascular;  Laterality: Right;   COLONOSCOPY  2007   ENDARTERECTOMY FEMORAL Right 08/09/2020   Procedure: RIGHT COMMON AND PROFUNDA FEMORAL ENDARTERECTOMY;  Surgeon: Algernon Huxley, MD;  Location: [facility];  Service: Vascular;  Laterality: Right;   EYE SURGERY Bilateral 2005   LOWER EXTREMITY ANGIOGRAPHY Right 07/05/2020   Procedure: LOWER EXTREMITY ANGIOGRAPHY;  Surgeon: Algernon Huxley, MD;  Location: [facility];  Service: Cardiovascular;  Laterality: Right;   Renal Bypass Surgery Bilateral 2000    History reviewed. No pertinent family history.  Allergies  Allergen Reactions   Bee Venom Anaphylaxis       Latest Ref Rng & Units 03/29/2022    1:29 AM 03/28/2022    9:29 AM 03/28/2022    2:55 AM  CBC  WBC 4.0 - 10.5 K/uL 17.6     Hemoglobin 13.0 - 17.0 g/dL 7.9  6.9  7.1   Hematocrit 39.0 - 52.0 % 24.7  21.8  21.8   Platelets 150 - 400 K/uL 299         CMP     Component Value Date/Time   NA 141 03/29/2022 0129   NA 140 02/02/2014 0356   K 3.8 03/29/2022 0129   K 3.7 02/02/2014 0356   CL 99 03/29/2022 0129   CL 104 02/02/2014 0356   CO2 30 03/29/2022 0129   CO2 29 02/02/2014 0356   GLUCOSE 115 (H) 03/29/2022 0129   GLUCOSE 160 (H) 02/02/2014 0356   BUN 33 (H) 03/29/2022 0129   BUN 33 (H) 02/02/2014 0356   CREATININE 1.31 (H) 03/29/2022 0129   CREATININE 0.86 02/02/2014 0356   CALCIUM 8.4 (L) 03/29/2022 0129   CALCIUM 8.6 10/10/2019 0305   PROT 7.3 02/28/2022 1226   PROT 7.5 01/29/2014 0949   ALBUMIN 3.9 02/28/2022 1226   ALBUMIN 3.1 (L) 01/29/2014 0949   AST 28 02/28/2022 1226   AST 39 (H) 01/29/2014 0949   ALT 18 02/28/2022 1226   ALT 27 01/29/2014 0949   ALKPHOS 57 02/28/2022 1226   ALKPHOS 79 01/29/2014 0949   BILITOT 0.9 02/28/2022 1226   BILITOT 0.7 01/29/2014 0949   GFRNONAA 51  (L) 03/29/2022 0129  GFRNONAA >60 02/02/2014 0356   GFRNONAA >60 06/25/2011 1325   GFRAA >60 10/15/2019 0634   GFRAA >60 02/02/2014 0356   GFRAA >60 06/25/2011 1325     No results found.     Assessment & Plan:   1. 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  This represents a limb threatening ischemia and places the patient at the risk for left limb loss.  Patient should undergo angiography of the left lower extremity with the hope for intervention for limb salvage.  The risks and benefits as well as the alternative therapies was discussed in detail with the patient.  All questions were answered.  Patient agrees to proceed with left lower extremity angiography.  The patient will follow up with me in the office after the procedure.   2. Primary hypertension Continue antihypertensive medications as already ordered, these medications have been reviewed and there are no changes at this time.  3. Stenosis of right carotid artery Patient had a previous right carotid stent placed and he is doing well postintervention.   Current Outpatient Medications on File Prior to Visit  Medication Sig Dispense Refill   acetaminophen (TYLENOL) 500 MG tablet Take 500 mg by mouth every 6 (six) hours as needed for fever or pain.     alendronate (FOSAMAX) 70 MG tablet Take 70 mg by mouth once a week. Sunday morning with full glass of water and sit up do not lay down     amoxicillin-clavulanate (AUGMENTIN) 875-125 MG tablet Take 1 tablet by mouth 2 (two) times daily.     apixaban (ELIQUIS) 2.5 MG TABS tablet Take 2.5 mg by mouth 2 (two) times daily.     ascorbic acid (VITAMIN C) 250 MG tablet Take 1 tablet (250 mg total) by mouth 2 (two) times daily. 60 tablet 6    aspirin EC 81 MG EC tablet Take 1 tablet (81 mg total) by mouth daily at 6 (six) AM. Swallow whole. 90 tablet 3   atorvastatin (LIPITOR) 40 MG tablet Take 1 tablet (40 mg total) by mouth daily. 30 tablet 2   Calcium Carb-Cholecalciferol (CALCIUM 600/VITAMIN D PO) Take by mouth.     CARTIA XT 300 MG 24 hr capsule Take 300 mg by mouth daily.     Fluticasone-Umeclidin-Vilant (TRELEGY ELLIPTA) 100-62.5-25 MCG/INH AEPB Inhale into the lungs.     magnesium oxide (MAG-OX) 400 (240 Mg) MG tablet Take 1 tablet (400 mg total) by mouth daily. 30 tablet 0   metoprolol tartrate (LOPRESSOR) 50 MG tablet Take 50 mg by mouth 2 (two) times daily.     Multiple Vitamin (MULTIVITAMIN WITH MINERALS) TABS tablet Take 1 tablet by mouth daily. 90 tablet 3   potassium chloride (KLOR-CON) 10 MEQ tablet Take 2 tablets (20 mEq total) by mouth daily. (Patient taking differently: Take 10-20 mEq by mouth daily.) 60 tablet 5   torsemide (DEMADEX) 20 MG tablet Take 40mg  on M, W, F and 20mg  on Tu, Thur,Sat & Sunday (Patient taking differently: Take 20-40 mg by mouth daily. Take 40mg  on M, F and 20mg  on Tu, Thur, Sat & Sunday) 40 tablet 3   traMADol (ULTRAM) 50 MG tablet Take 50 mg by mouth every 6 (six) hours as needed.     Vitamin D, Ergocalciferol, (DRISDOL) 1.25 MG (50000 UNIT) CAPS capsule Take  1 capsule (50,000 Units total) by mouth every 7 (seven) days. 5 capsule 0   Wheat Dextrin (BENEFIBER PO) Take by mouth. 2 teaspoons twice a day     No current facility-administered medications on file prior to visit.    There are no Patient Instructions on file for this visit. No follow-ups on file.   Kris Hartmann, NP

## 2022-04-12 NOTE — Progress Notes (Signed)
Subjective:    Patient ID: [name], male    DOB: 01-Oct-1928, 89 y.o.   MRN: [id number] Chief Complaint  Patient presents with   Follow-up    Follow up with no studies     [name] is a 89 year old male who presents today for evaluation of left toe ulceration.  He has been working with podiatry and receiving excellent wound care but despite this the wound has continued to worsen and deteriorate.  The patient has known peripheral arterial disease.  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  He has also began to have some development of mild rest pain like symptoms.  There are no additional open wounds or ulcerations.  The patient recently had a right carotid stent placed which she currently has done well postintervention, with the exception of bleeding from an IV site post discharge.    Review of Systems  Skin:  Positive for wound.  All other systems reviewed and are negative.      Objective:   Physical Exam Vitals reviewed.  HENT:     Head: Normocephalic.  Cardiovascular:     Rate and Rhythm: Normal rate.  Pulmonary:     Effort: Pulmonary effort is normal.  Musculoskeletal:       Feet:  Feet:     Left foot:     Skin integrity: Ulcer present.  Neurological:     Mental Status: He is alert and oriented to person, place, and time.     Motor: Weakness present.     Gait: Gait abnormal.  Psychiatric:        Mood and Affect: Mood normal.        Behavior: Behavior normal.        Thought Content: Thought content normal.        Judgment: Judgment normal.     BP 115/65 (BP Location: Left Arm)   Pulse 63   Resp 16   Past Medical History:  Diagnosis Date   (HFpEF) heart failure with preserved ejection fraction (HCC)    Aortic atherosclerosis ([place])    Basal cell carcinoma 09/13/2021   Left cheek. 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   Thoracic aortic ectasia (HCC)    a.) measured 3.6 x 3.3 cm on 10/08/2019    Social History   Socioeconomic History   Marital status: Married    Spouse name: Not on file   Number of children: Not on file   Years of education: Not on file   Highest education level: Not on file  Occupational History   Not on file  Tobacco Use   Smoking status: Former    Packs/day: 1.00    Years: 30.00    Additional pack years: 0.00    Total pack years: 30.00    Types: Cigarettes    Quit date: 26    Years since quitting: 27.2   Smokeless tobacco: Never  Vaping Use   Vaping Use: Never used  Substance and Sexual Activity   Alcohol use: Never   Drug use: Never   Sexual activity: Not Currently  Other Topics Concern   Not on file  Social History Narrative   Lives at home alone with help from daughter.   Social Determinants of Health   Financial Resource Strain: Not on file  Food Insecurity: No Food Insecurity (03/27/2022)   Hunger Vital Sign    Worried About Running Out of Food in the Last Year: Never true    Ran Out of Food in the Last Year: Never true  Transportation Needs: No Transportation Needs (03/27/2022)   PRAPARE - Hydrologist (Medical): No    Lack of Transportation (Non-Medical): No  Physical Activity: Not on file  Stress: Not on file  Social Connections: Not on file  Intimate Partner Violence: Not At Risk (03/27/2022)   Humiliation, Afraid, Rape, and Kick questionnaire    Fear of Current or Ex-Partner: No    Emotionally Abused: No    Physically Abused: No    Sexually Abused: No    Past Surgical History:  Procedure Laterality  Date   CAROTID PTA/STENT INTERVENTION Right 03/18/2022   Procedure: CAROTID PTA/STENT INTERVENTION;  Surgeon: Algernon Huxley, MD;  Location: [facility];  Service: Cardiovascular;  Laterality: Right;   COLONOSCOPY  2007   ENDARTERECTOMY FEMORAL Right 08/09/2020   Procedure: RIGHT COMMON AND PROFUNDA FEMORAL ENDARTERECTOMY;  Surgeon: Algernon Huxley, MD;  Location: [facility];  Service: Vascular;  Laterality: Right;   EYE SURGERY Bilateral 2005   LOWER EXTREMITY ANGIOGRAPHY Right 07/05/2020   Procedure: LOWER EXTREMITY ANGIOGRAPHY;  Surgeon: Algernon Huxley, MD;  Location: [place] [facility];  Service: Cardiovascular;  Laterality: Right;   Renal Bypass Surgery Bilateral 2000    History reviewed. No pertinent family history.  Allergies  Allergen Reactions   Bee Venom Anaphylaxis       Latest Ref Rng & Units 03/29/2022    1:29 AM 03/28/2022    9:29 AM 03/28/2022    2:55 AM  CBC  WBC 4.0 - 10.5 K/uL 17.6     Hemoglobin 13.0 - 17.0 g/dL 7.9  6.9  7.1   Hematocrit 39.0 - 52.0 % 24.7  21.8  21.8   Platelets 150 - 400 K/uL 299         CMP     Component Value Date/Time   NA 141 03/29/2022 0129   NA 140 02/02/2014 0356   K 3.8 03/29/2022 0129   K 3.7 02/02/2014 0356   CL 99 03/29/2022 0129   CL 104 02/02/2014 0356   CO2 30 03/29/2022 0129   CO2 29 02/02/2014 0356   GLUCOSE 115 (H) 03/29/2022 0129   GLUCOSE 160 (H) 02/02/2014 0356   BUN 33 (H) 03/29/2022 0129   BUN 33 (H) 02/02/2014 0356   CREATININE 1.31 (H) 03/29/2022 0129   CREATININE 0.86 02/02/2014 0356   CALCIUM 8.4 (L) 03/29/2022 0129   CALCIUM 8.6 10/10/2019 0305   PROT 7.3 02/28/2022 1226   PROT 7.5 01/29/2014 0949   ALBUMIN 3.9 02/28/2022 1226   ALBUMIN 3.1 (L) 01/29/2014 0949   AST 28 02/28/2022 1226   AST 39 (H) 01/29/2014 0949   ALT 18 02/28/2022 1226   ALT 27 01/29/2014 0949   ALKPHOS 57 02/28/2022 1226   ALKPHOS 79 01/29/2014 0949   BILITOT 0.9 02/28/2022 1226   BILITOT 0.7 01/29/2014 0949   GFRNONAA 51  (L) 03/29/2022 0129  GFRNONAA >60 02/02/2014 0356   GFRNONAA >60 06/25/2011 1325   GFRAA >60 10/15/2019 0634   GFRAA >60 02/02/2014 0356   GFRAA >60 06/25/2011 1325     No results found.     Assessment & Plan:   1. 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  This represents a limb threatening ischemia and places the patient at the risk for left limb loss.  Patient should undergo angiography of the left lower extremity with the hope for intervention for limb salvage.  The risks and benefits as well as the alternative therapies was discussed in detail with the patient.  All questions were answered.  Patient agrees to proceed with left lower extremity angiography.  The patient will follow up with me in the office after the procedure.   2. Primary hypertension Continue antihypertensive medications as already ordered, these medications have been reviewed and there are no changes at this time.  3. Stenosis of right carotid artery Patient had a previous right carotid stent placed and he is doing well postintervention.   Current Outpatient Medications on File Prior to Visit  Medication Sig Dispense Refill   acetaminophen (TYLENOL) 500 MG tablet Take 500 mg by mouth every 6 (six) hours as needed for fever or pain.     alendronate (FOSAMAX) 70 MG tablet Take 70 mg by mouth once a week. Sunday morning with full glass of water and sit up do not lay down     amoxicillin-clavulanate (AUGMENTIN) 875-125 MG tablet Take 1 tablet by mouth 2 (two) times daily.     apixaban (ELIQUIS) 2.5 MG TABS tablet Take 2.5 mg by mouth 2 (two) times daily.     ascorbic acid (VITAMIN C) 250 MG tablet Take 1 tablet (250 mg total) by mouth 2 (two) times daily. 60 tablet 6    aspirin EC 81 MG EC tablet Take 1 tablet (81 mg total) by mouth daily at 6 (six) AM. Swallow whole. 90 tablet 3   atorvastatin (LIPITOR) 40 MG tablet Take 1 tablet (40 mg total) by mouth daily. 30 tablet 2   Calcium Carb-Cholecalciferol (CALCIUM 600/VITAMIN D PO) Take by mouth.     CARTIA XT 300 MG 24 hr capsule Take 300 mg by mouth daily.     Fluticasone-Umeclidin-Vilant (TRELEGY ELLIPTA) 100-62.5-25 MCG/INH AEPB Inhale into the lungs.     magnesium oxide (MAG-OX) 400 (240 Mg) MG tablet Take 1 tablet (400 mg total) by mouth daily. 30 tablet 0   metoprolol tartrate (LOPRESSOR) 50 MG tablet Take 50 mg by mouth 2 (two) times daily.     Multiple Vitamin (MULTIVITAMIN WITH MINERALS) TABS tablet Take 1 tablet by mouth daily. 90 tablet 3   potassium chloride (KLOR-CON) 10 MEQ tablet Take 2 tablets (20 mEq total) by mouth daily. (Patient taking differently: Take 10-20 mEq by mouth daily.) 60 tablet 5   torsemide (DEMADEX) 20 MG tablet Take 40mg  on M, W, F and 20mg  on Tu, Thur,Sat & Sunday (Patient taking differently: Take 20-40 mg by mouth daily. Take 40mg  on M, F and 20mg  on Tu, Thur, Sat & Sunday) 40 tablet 3   traMADol (ULTRAM) 50 MG tablet Take 50 mg by mouth every 6 (six) hours as needed.     Vitamin D, Ergocalciferol, (DRISDOL) 1.25 MG (50000 UNIT) CAPS capsule Take  1 capsule (50,000 Units total) by mouth every 7 (seven) days. 5 capsule 0   Wheat Dextrin (BENEFIBER PO) Take by mouth. 2 teaspoons twice a day     No current facility-administered medications on file prior to visit.    There are no Patient Instructions on file for this visit. No follow-ups on file.   Kris Hartmann, NP

## 2022-04-12 NOTE — Telephone Encounter (Signed)
Spoke with the patient's daughter and he is scheduled with Dr. Lucky Cowboy on 04/18/22 with a 1:30 pm arrival time to the [facility]. Pre-procedure instructions were discussed and will be sent to Mychart and mailed.

## 2022-04-15 ENCOUNTER — Other Ambulatory Visit (INDEPENDENT_AMBULATORY_CARE_PROVIDER_SITE_OTHER): Payer: Self-pay | Admitting: Vascular Surgery

## 2022-04-15 ENCOUNTER — Encounter: Payer: Medicare Other | Admitting: Physician Assistant

## 2022-04-15 DIAGNOSIS — S41111A Laceration without foreign body of right upper arm, initial encounter: Secondary | ICD-10-CM | POA: Diagnosis not present

## 2022-04-15 DIAGNOSIS — I6523 Occlusion and stenosis of bilateral carotid arteries: Secondary | ICD-10-CM

## 2022-04-15 NOTE — Progress Notes (Signed)
AHRON, DEGNER ([id number]) [id number] Page 1 of 11 Visit Report for 3/Good/2024 Arrival Information Details Patient Name: Date of Service: [name], Christopher IN Good. 3/Good/2024 12:00 PM Medical Record Number: [id number] Patient Account Number: [id number] Date of Birth/Sex: Treating RN: [name] (89 y.o. [name] Primary Care Nickolas Chalfin: Harrel Lemon Other Clinician: Referring Eugean Arnott: Treating Alexyss Balzarini/Extender: Sheryle Spray in Treatment: 2 Visit Information History Since Last Visit Added or deleted any medications: No Patient Arrived: Wheel Chair Any new allergies or adverse reactions: No Arrival Time: 12:04 Had a fall or experienced change in No Accompanied By: daughter activities of daily living that may affect Transfer Assistance: None risk of falls: Patient Identification Verified: Yes Hospitalized since last visit: No Secondary Verification Process Completed: Yes Has Dressing in Place as Prescribed: Yes Patient Has Alerts: Yes Pain Present Now: No Patient Alerts: Patient on Blood Thinner 87mg  aspirin Eliquis NOT DIABETIC Electronic Signature(s) Unsigned Entered By: Levora Dredge on 03/Good/2024 12:10:53 -------------------------------------------------------------------------------- Clinic Level of Care Assessment Details Patient Name: Date of Service: [name], Christopher IN Good. 3/Good/2024 12:00 PM Medical Record Number: [id number] Patient Account Number: [id number] Date of Birth/Sex: Treating RN: [birth year] (89 y.o. [name] Primary Care Dayonna Selbe: Harrel Lemon Other Clinician: Referring Calvert Charland: Treating Kyliyah Stirn/Extender: Sheryle Spray in Treatment: 2 Clinic Level of Care Assessment Items TOOL 4 Quantity Score []  - 0 Use when only an EandM is performed on FOLLOW-UP visit ASSESSMENTS - Nursing Assessment / Reassessment X- 1 10 Reassessment of Co-morbidities (includes updates in  patient status) X- 1 5 Reassessment of Adherence to Treatment Plan [name], [name] ([id number]) [id number] Page 2 of 11 ASSESSMENTS - Wound and Skin A ssessment / Reassessment []  - Simple Wound Assessment / Reassessment - one wound 0 X- 2 5 Complex Wound Assessment / Reassessment - multiple wounds []  - 0 Dermatologic / Skin Assessment (not related to wound area) ASSESSMENTS - Focused Assessment []  - 0 Circumferential Edema Measurements - multi extremities []  - 0 Nutritional Assessment / Counseling / Intervention []  - 0 Lower Extremity Assessment (monofilament, tuning fork, pulses) []  - 0 Peripheral Arterial Disease Assessment (using hand held doppler) ASSESSMENTS - Ostomy and/or Continence Assessment and Care []  - 0 Incontinence Assessment and Management []  - 0 Ostomy Care Assessment and Management (repouching, etc.) PROCESS - Coordination of Care X - Simple Patient / Family Education for ongoing care 1 15 []  - 0 Complex (extensive) Patient / Family Education for ongoing care []  - 0 Staff obtains Programmer, systems, Records, T Results / Process Orders est []  - 0 Staff telephones HHA, Nursing Homes / Clarify orders / etc []  - 0 Routine Transfer to another Facility (non-emergent condition) []  - 0 Routine Hospital Admission (non-emergent condition) []  - 0 New Admissions / Biomedical engineer / Ordering NPWT Apligraf, etc. , []  - 0 Emergency Hospital Admission (emergent condition) X- 1 10 Simple Discharge Coordination []  - 0 Complex (extensive) Discharge Coordination PROCESS - Special Needs []  - 0 Pediatric / Minor Patient Management []  - 0 Isolation Patient Management []  - 0 Hearing / Language / Visual special needs []  - 0 Assessment of Community assistance (transportation, D/C planning, etc.) []  - 0 Additional assistance / Altered mentation []  - 0 Support Surface(s) Assessment (bed, cushion, seat, etc.) INTERVENTIONS - Wound Cleansing /  Measurement []  - 0 Simple Wound Cleansing - one wound X- 2 5 Complex Wound Cleansing - multiple wounds X- 1 5 Wound Imaging (photographs - any number of wounds) []  - 0 Wound  Tracing (instead of photographs) []  - 0 Simple Wound Measurement - one wound X- 2 5 Complex Wound Measurement - multiple wounds INTERVENTIONS - Wound Dressings X - Small Wound Dressing one or multiple wounds 2 10 []  - 0 Medium Wound Dressing one or multiple wounds []  - 0 Large Wound Dressing one or multiple wounds X- 1 5 Application of Medications - topical []  - 0 Application of Medications - injection INTERVENTIONS - Miscellaneous []  - 0 External ear exam [name], [name] ([id number]) [id number] Page 3 of 11 []  - 0 Specimen Collection (cultures, biopsies, blood, body fluids, etc.) []  - 0 Specimen(s) / Culture(s) sent or taken to Lab for analysis []  - 0 Patient Transfer (multiple staff / Harrel Lemon Lift / Similar devices) []  - 0 Simple Staple / Suture removal (25 or less) []  - 0 Complex Staple / Suture removal (26 or more) []  - 0 Hypo / Hyperglycemic Management (close monitor of Blood Glucose) []  - 0 Ankle / Brachial Index (ABI) - do not check if billed separately X- 1 5 Vital Signs Has the patient been seen at the hospital within the last three years: Yes Total Score: 105 Level Of Care: New/Established - Level 3 Electronic Signature(s) Unsigned Entered By: Levora Dredge on 03/Good/2024 13:03:48 -------------------------------------------------------------------------------- Encounter Discharge Information Details Patient Name: Date of Service: [name], Christopher IN Good. 3/Good/2024 12:00 PM Medical Record Number: [id number] Patient Account Number: [id number] Date of Birth/Sex: Treating RN: [birth year] [name] (89 y.o. [name] Primary Care Nirav Sweda: Harrel Lemon Other Clinician: Referring Airelle Everding: Treating Ethne Jeon/Extender: Sheryle Spray in  Treatment: 2 Encounter Discharge Information Items Discharge Condition: Stable Ambulatory Status: Wheelchair Discharge Destination: Home Transportation: Private Auto Accompanied By: daughter Schedule Follow-up Appointment: Yes Clinical Summary of Care: Electronic Signature(s) Unsigned Entered By: Levora Dredge on 03/Good/2024 13:05:03 Signature(s): [name], [name] ([id number]) [phone] Date(s): [id number] Page 4 of 11 -------------------------------------------------------------------------------- Lower Extremity Assessment Details Patient Name: Date of Service: [name], Christopher IN Good. 3/Good/2024 12:00 PM Medical Record Number: [id number] Patient Account Number: [id number] Date of Birth/Sex: Treating RN: [birth year], [name] (Good y.o. [name] Primary Care Matsue Strom: Harrel Lemon Other Clinician: Referring Marcelo Ickes: Treating Karon Cotterill/Extender: Sheryle Spray in Treatment: 2 Electronic Signature(s) Unsigned Entered By: Levora Dredge on 03/Good/2024 12:24:44 -------------------------------------------------------------------------------- Multi Wound Chart Details Patient Name: Date of Service: [name], Christopher IN Good. 3/Good/2024 12:00 PM Medical Record Number: [id number] Patient Account Number: [id number] Date of Birth/Sex: Treating RN: [birth year][name] (89 y.o. [name] Primary Care Bertil Brickey: Harrel Lemon Other Clinician: Referring Sheriff Rodenberg: Treating Kentrail Shew/Extender: Sheryle Spray in Treatment: 2 Vital Signs Height(in): 4 Pulse(bpm): 53 Weight(lbs): 138 Blood Pressure(mmHg): 121/72 Body Mass Index(BMI): 20.4 Temperature(F): 97.8 Respiratory Rate(breaths/min): Good [1:Photos:] [N/A:N/A] Right Upper Arm Left Forearm N/A Wound Location: Trauma Trauma N/A Wounding Event: Skin Tear Trauma, Other N/A Primary Etiology: Chronic Obstructive Pulmonary Chronic Obstructive Pulmonary N/A Comorbid History: Disease  (COPD), Arrhythmia, Disease (COPD), Arrhythmia, Congestive Heart Failure, Coronary Congestive Heart Failure, Coronary Artery Disease, Hypertension, Artery Disease, Hypertension, Peripheral Arterial Disease Peripheral Arterial Disease 03/20/2022 03/28/2022 N/A Date Acquired: 2 1 N/A Weeks of Treatment: Open Open N/A Wound Status: No No N/A Wound Recurrence: 1x0.7x0.1 1x0.5x0.1 N/A Measurements Good x W x D (cm) 0.55 0.393 N/A A (cm) : rea 0.055 0.039 N/A Volume (cm) : 96.80% 68.70% N/A % Reduction in A rea: 96.80% 69.00% N/A % Reduction in Volume: Partial Thickness Partial Thickness N/A Classification: Medium Medium N/A Exudate A mount: Serosanguineous Serosanguineous N/A Exudate Type: Banh, Deni  Good ([id number] Page 5 of 11 red, brown red, brown N/A Exudate Color: Distinct, outline attached N/A N/A Wound Margin: Large (67-100%) Small (1-33%) N/A Granulation Amount: Pink, Pale Pink N/A Granulation Quality: Small (1-33%) Large (67-100%) N/A Necrotic Amount: Adherent Slough Eschar N/A Necrotic Tissue: Fat Layer (Subcutaneous Tissue): Yes Fat Layer (Subcutaneous Tissue): Yes N/A Exposed Structures: Fascia: No Tendon: No Muscle: No Joint: No Bone: No Medium (34-66%) Small (1-33%) N/A Epithelialization: Treatment Notes Electronic Signature(s) Unsigned Entered By: Levora Dredge on 03/Good/2024 12:24:48 -------------------------------------------------------------------------------- Multi-Disciplinary Care Plan Details Patient Name: Date of Service: [name], Christopher IN Good. 3/Good/2024 12:00 PM Medical Record Number: [id number] Patient Account Number: [id number] Date of Birth/Sex: Treating RN: [birth year][name] (89 y.o. [name] Primary Care Aldena Worm: Harrel Lemon Other Clinician: Referring Jazariah Teall: Treating Brynnlee Cumpian/Extender: Sheryle Spray in Treatment: 2 Active Inactive Abuse / Safety / Falls / Self  Care Management Nursing Diagnoses: Potential for falls Goals: Patient will remain injury free related to falls Date Initiated: 03/27/2022 Target Resolution Date: 03/27/2022 Goal Status: Active Patient/caregiver will verbalize understanding of skin care regimen Date Initiated: 03/27/2022 Date Inactivated: 04/04/2022 Target Resolution Date: 03/27/2022 Goal Status: Met Patient/caregiver will verbalize/demonstrate measure taken to improve self care Date Initiated: 03/27/2022 Target Resolution Date: 03/27/2022 Goal Status: Active Patient/caregiver will verbalize/demonstrate measures taken to improve the patient's personal safety Date Initiated: 03/27/2022 Target Resolution Date: 03/27/2022 Goal Status: Active Interventions: Podiatry chair, stretcher in low position and side rails up as needed Notes: Necrotic Tissue Nursing Diagnoses: Impaired tissue integrity related to necrotic/devitalized tissue [name], [name] ([id number]) [phone].pdf Page 6 of 11 Knowledge deficit related to management of necrotic/devitalized tissue Goals: Necrotic/devitalized tissue will be minimized in the wound bed Date Initiated: 03/27/2022 Date Inactivated: 3/Good/2024 Target Resolution Date: 03/27/2022 Goal Status: Met Patient/caregiver will verbalize understanding of reason and process for debridement of necrotic tissue Date Initiated: 03/27/2022 Target Resolution Date: 03/27/2022 Goal Status: Active Interventions: Assess patient pain level pre-, during and post procedure and prior to discharge Provide education on necrotic tissue and debridement process Treatment Activities: Apply topical anesthetic as ordered : 03/27/2022 Notes: Wound/Skin Impairment Nursing Diagnoses: Impaired tissue integrity Goals: Patient/caregiver will verbalize understanding of skin care regimen Date Initiated: 03/27/2022 Date Inactivated: 04/04/2022 Target Resolution Date: 03/28/2022 Goal Status:  Met Ulcer/skin breakdown will have a volume reduction of 30% by week 4 Date Initiated: 03/27/2022 Target Resolution Date: 04/24/2022 Goal Status: Active Ulcer/skin breakdown will have a volume reduction of 50% by week 8 Date Initiated: 03/27/2022 Target Resolution Date: 05/22/2022 Goal Status: Active Ulcer/skin breakdown will have a volume reduction of 80% by week 12 Date Initiated: 03/27/2022 Target Resolution Date: 06/19/2022 Goal Status: Active Ulcer/skin breakdown will heal within 14 weeks Date Initiated: 03/27/2022 Target Resolution Date: 07/03/2022 Goal Status: Active Interventions: Assess patient/caregiver ability to obtain necessary supplies Assess patient/caregiver ability to perform ulcer/skin care regimen upon admission and as needed Assess ulceration(s) every visit Treatment Activities: Referred to DME Kyron Schlitt for dressing supplies : 03/27/2022 Topical wound management initiated : 03/27/2022 Notes: Electronic Signature(s) Unsigned Entered By: Levora Dredge on 03/Good/2024 13:04:24 -------------------------------------------------------------------------------- Pain Assessment Details Patient Name: Date of Service: [name], Christopher IN Good. 3/Good/2024 12:00 PM Medical Record Number: [id number] Patient Account Number: [id number] Date of Birth/Sex: Treating RN: [name], [name] (89 y.o. [name], [name], [name] ([id number]) [phone].pdf Page 7 of 11 Primary Care Addam Goeller: Harrel Lemon Other Clinician: Referring Taila Basinski: Treating Burlene Montecalvo/Extender: Sheryle Spray in Treatment: 2 Active Problems Location of Pain Severity and Description of  Pain Patient Has Paino No Site Locations Rate the pain. Current Pain Level: 0 Pain Management and Medication Current Pain Management: Electronic Signature(s) Unsigned Entered By: Levora Dredge on 03/Good/2024  12:11:17 -------------------------------------------------------------------------------- Patient/Caregiver Education Details Patient Name: Date of Service: [name], Christopher IN Good. 3/Good/2024andnbsp12:00 PM Medical Record Number: [id number] Patient Account Number: [id number] Date of Birth/Gender: Treating RN: [birth year], [name] (89 y.o. [name] Primary Care Physician: Harrel Lemon Other Clinician: Referring Physician: Treating Physician/Extender: Sheryle Spray in Treatment: 2 Education Assessment Education Provided To: Patient and Caregiver Education Topics Provided Wound/Skin Impairment: Handouts: Caring for Your Ulcer Methods: Explain/Verbal Responses: State content correctly Electronic Signature(s) [name], [name] ([id number]) [id number].pdf Page 8 of 11 Unsigned Entered By: Levora Dredge on 03/Good/2024 13:04:35 -------------------------------------------------------------------------------- Wound Assessment Details Patient Name: Date of Service: [name], Christopher IN Good. 3/Good/2024 12:00 PM Medical Record Number: [id number] Patient Account Number: [id number] Date of Birth/Sex: Treating RN: [birth year] (89 y.o. [name] Primary Care Menachem Urbanek: Harrel Lemon Other Clinician: Referring Zyion Leidner: Treating Eulene Pekar/Extender: Sheryle Spray in Treatment: 2 Wound Status Wound Number: 1 Primary Skin T ear Etiology: Wound Location: Right Upper Arm Wound Open Wounding Event: Trauma Status: Date Acquired: 03/20/2022 Comorbid Chronic Obstructive Pulmonary Disease (COPD), Arrhythmia, Weeks Of Treatment: 2 History: Congestive Heart Failure, Coronary Artery Disease, Hypertension, Clustered Wound: No Peripheral Arterial Disease Photos Wound Measurements Length: (cm) 1 Width: (cm) 0.7 Depth: (cm) 0.1 Area: (cm) 0.55 Volume: (cm) 0.055 % Reduction in Area: 96.8% % Reduction in Volume:  96.8% Epithelialization: Medium (34-66%) Tunneling: No Undermining: No Wound Description Classification: Partial Thickness Wound Margin: Distinct, outline attached Exudate Amount: Medium Exudate Type: Serosanguineous Exudate Color: red, brown Foul Odor After Cleansing: No Slough/Fibrino Yes Wound Bed Granulation Amount: Large (67-100%) Exposed Structure Granulation Quality: Pink, Pale Fascia Exposed: No Necrotic Amount: Small (1-33%) Fat Layer (Subcutaneous Tissue) Exposed: Yes Necrotic Quality: Adherent Slough Tendon Exposed: No Muscle Exposed: No Joint Exposed: No Bone Exposed: No [name], [name] ([id number]) [phone].pdf Page 9 of 11 Treatment Notes Wound #1 (Upper Arm) Wound Laterality: Right Cleanser Normal Saline Discharge Instruction: Wash your hands with soap and water. Remove old dressing, discard into plastic bag and place into trash. Cleanse the wound with Normal Saline prior to applying a clean dressing using gauze sponges, not tissues or cotton balls. Do not scrub or use excessive force. Pat dry using gauze sponges, not tissue or cotton balls. Peri-Wound Care Topical Primary Dressing Xeroform 4x4-HBD (in/in) Discharge Instruction: Apply Xeroform 4x4-HBD (in/in) as directed Secondary Dressing ABD Pad 5x9 (in/in) Discharge Instruction: Cover with ABD pad [place] Roll-Medium Discharge Instruction: Apply Conforming Stretch Guaze Bandage as directed Secured With [employer] Dressing, Latex-free, Size 5, Small-Head / Shoulder / Thigh Compression Wrap Compression Stockings Add-Ons Electronic Signature(s) Unsigned Entered By: Levora Dredge on 03/Good/2024 12:23:55 -------------------------------------------------------------------------------- Wound Assessment Details Patient Name: Date of Service: [name], Christopher IN Good. 3/Good/2024 12:00 PM Medical Record Number: [id number] Patient Account Number: [id number] Date of  Birth/Sex: Treating RN: [birth year] (89 y.o. [name] Primary Care [name]: Harrel Lemon Other Clinician: Referring Devonia Farro: Treating Brailyn Delman/Extender: Sheryle Spray in Treatment: 2 Wound Status Wound Number: 2 Primary Trauma, Other Etiology: Wound Location: Left Forearm Wound Open Wounding Event: Trauma Status: Date Acquired: 03/28/2022 Notes: pt wasnt sure, states may have hit it on something Weeks Of Treatment: 1 Comorbid Chronic Obstructive Pulmonary Disease (COPD), Arrhythmia, Clustered Wound: No History: Congestive Heart Failure, Coronary Artery Disease, Hypertension, Peripheral Arterial Disease Photos Delphina Cahill, Dani  Good ([id number] Page 10 of 11 Wound Measurements Length: (cm) 1 Width: (cm) 0.5 Depth: (cm) 0.1 Area: (cm) 0.393 Volume: (cm) 0.039 % Reduction in Area: 68.7% % Reduction in Volume: 69% Epithelialization: Small (1-33%) Tunneling: No Undermining: No Wound Description Classification: Partial Thickness Exudate Amount: Medium Exudate Type: Serosanguineous Exudate Color: red, brown Foul Odor After Cleansing: No Slough/Fibrino Yes Wound Bed Granulation Amount: Small (1-33%) Exposed Structure Granulation Quality: Pink Fat Layer (Subcutaneous Tissue) Exposed: Yes Necrotic Amount: Large (67-100%) Necrotic Quality: Eschar Treatment Notes Wound #2 (Forearm) Wound Laterality: Left Cleanser Normal Saline Discharge Instruction: Wash your hands with soap and water. Remove old dressing, discard into plastic bag and place into trash. Cleanse the wound with Normal Saline prior to applying a clean dressing using gauze sponges, not tissues or cotton balls. Do not scrub or use excessive force. Pat dry using gauze sponges, not tissue or cotton balls. Peri-Wound Care Topical Primary Dressing Xeroform 4x4-HBD (in/in) Discharge Instruction: Apply Xeroform 4x4-HBD (in/in) as directed Secondary  Dressing ABD Pad 5x9 (in/in) Discharge Instruction: Cover with ABD pad [place] Roll-Medium Discharge Instruction: Apply Conforming Stretch Guaze Bandage as directed Secured With [employer] Dressing, Latex-free, Size 5, Small-Head / Shoulder / Thigh Compression Wrap Compression Stockings Add-Ons Electronic Signature(s) Unsigned Entered By: Levora Dredge on 03/Good/2024 12:24:34 Signature(s): RHYLEY, ZILCH ([id number]) [id number] Date(s): 90.pdf Page 11 of 11 -------------------------------------------------------------------------------- Vitals Details Patient Name: Date of Service: [name], Christopher IN Good. 3/Good/2024 12:00 PM Medical Record Number: [id number] Patient Account Number: [id number] Date of Birth/Sex: Treating RN: [birth year], [name] (89 y.o. [name] Primary Care Donja Tipping: Harrel Lemon Other Clinician: Referring Catherene Kaleta: Treating Myquan Schaumburg/Extender: Sheryle Spray in Treatment: 2 Vital Signs Time Taken: 12:08 Temperature (F): 97.8 Height (in): 69 Pulse (bpm): 89 Weight (lbs): 138 Respiratory Rate (breaths/min): Good Body Mass Index (BMI): 20.4 Blood Pressure (mmHg): 121/72 Reference Range: 80 - 120 mg / dl Electronic Signature(s) Unsigned Entered By: Levora Dredge on 03/Good/2024 12:11:10 Signature(s): Date(s):

## 2022-04-15 NOTE — Progress Notes (Signed)
[name], [name] ([id number]) [id number] Page 1 of 7 Visit Report for 04/15/2022 Chief Complaint Document Details Patient Name: Date of Service: [name], Christopher IN L. 04/15/2022 12:00 PM Medical Record Number: [id number] Patient Account Number: [id number] Date of Birth/Sex: Treating RN: 11-22-1928 (89 y.o. [name], [place] Primary Care Provider: Harrel Lemon Other Clinician: Referring Provider: Treating Provider/Extender: Sheryle Spray in Treatment: 2 Information Obtained from: Patient Chief Complaint Right arm skin tear Electronic Signature(s) Signed: 04/15/2022 12:01:56 PM By: Worthy Keeler PA-C Entered By: Worthy Keeler on 04/15/2022 12:01:56 -------------------------------------------------------------------------------- HPI Details Patient Name: Date of Service: [name], Christopher IN L. 04/15/2022 12:00 PM Medical Record Number: [id number] Patient Account Number: [id number] Date of Birth/Sex: Treating RN: Jul 22, 1928 (89 y.o. [name], [place] Primary Care Provider: Harrel Lemon Other Clinician: Referring Provider: Treating Provider/Extender: Sheryle Spray in Treatment: 2 History of Present Illness HPI Description: 03/27/2022; patient presents for follow-up. He was seen yesterday in our clinic for the first time for a skin tear to his right arm. He was recently hospitalized on 03/18/2018 for a right carotid artery stent. During that hospitalization he developed a skin tear and has had difficulty controlling the bleeding. He was seen in our clinic yesterday and devitalized tissue was taken off and the site cauterized with silver nitrate. Despite this he continues to have active bleeding soaking through dressings. He is currently on Eliquis and aspirin. He states this is prescribed by his cardiologist. Daughter notes that patient appears more weak than usual. Vitals are overall stable with no tachycardia. Patient is  alert and oriented. 3/4 after the patient was here last week. She was cauterized but ended up in the hospital. During this admission the patient's wound wound dressing was not healed and she was discharged to return to clinic here. The original wound is on the right lateral arm. We noticed that there is a similar area on the left anterior arm which looks as though it seemed danger of bleeding but is not currently bleeding. The patient is on Eliquis. 3/7; this is a patient I saw earlier this week. Predominantly for a wound on the right anterior lower extremity. In passing we noted that she had a hematoma on the left we wrapped the arm. Unfortunately it is not held together. We used a #5 curette to remove the underlying bleeding. Fortunately this stopped. The area on the right lower extremity appears better 3/11; he has been using Xeroform to the wound beds without issue. 04-15-2022 upon evaluation today patient's wounds are actually significantly smaller compared to last time I saw him. This is actually very good news and [name], [name] ([id number]) [id number] Page 2 of 7 overall I think that he is headed in the right direction. Fortunately I do not see any evidence of active infection locally or systemically which is great news and overall I am extremely pleased with where we stand today. Electronic Signature(s) Signed: 04/15/2022 1:04:07 PM By: Worthy Keeler PA-C Entered By: Worthy Keeler on 04/15/2022 13:04:07 -------------------------------------------------------------------------------- Physical Exam Details Patient Name: Date of Service: [name], Christopher IN L. 04/15/2022 12:00 PM Medical Record Number: [id number] Patient Account Number: [id number] Date of Birth/Sex: Treating RN: 03-03-1928 (89 y.o. [name], [place] Primary Care Provider: Harrel Lemon Other Clinician: Referring Provider: Treating Provider/Extender: Sheryle Spray in  Treatment: 2 Constitutional Well-nourished and well-hydrated in no acute distress. Respiratory normal breathing without difficulty. Psychiatric this patient is able to make decisions and demonstrates  good insight into disease process. Alert and Oriented x 3. pleasant and cooperative. Notes Upon inspection patient's wound bed actually showed signs of excellent granulation and epithelization at this point. I do believe that his wounds are both doing quite well no sharp debridement necessary and overall I think that he is moving in the right direction here. He seems to be feeling much better to which is also great news. Electronic Signature(s) Signed: 04/15/2022 1:04:24 PM By: Worthy Keeler PA-C Entered By: Worthy Keeler on 04/15/2022 13:04:24 -------------------------------------------------------------------------------- Physician Orders Details Patient Name: Date of Service: [name], Christopher IN L. 04/15/2022 12:00 PM Medical Record Number: [id number] Patient Account Number: [id number] Date of Birth/Sex: Treating RN: 02-16-1928 (89 y.o. Clide Dales Primary Care Provider: Harrel Lemon Other Clinician: Referring Provider: Treating Provider/Extender: Sheryle Spray in Treatment: 2 Verbal / Phone Orders: No Diagnosis Coding [name], [name] ([id number]) [id number] Page 3 of 7 ICD-10 Coding Code Description [id number] Laceration without foreign body of right upper arm, initial encounter Z79.01 Long term (current) use of anticoagulants I73.89 Other specified peripheral vascular diseases J44.9 Chronic obstructive pulmonary disease, unspecified Follow-up Appointments Return Appointment in 1 week. Nurse Visit as needed - Thursday for dressing change Bathing/ Shower/ Hygiene Wound #1 Right Upper Arm Clean wound with Normal Saline or wound cleanser. Wash wounds with antibacterial soap and water. May shower; gently cleanse wound with  antibacterial soap, rinse and pat dry prior to dressing wounds No tub bath. Wound Treatment Wound #1 - Upper Arm Wound Laterality: Right Cleanser: Normal Saline 2 x Per Week/30 Days Discharge Instructions: Wash your hands with soap and water. Remove old dressing, discard into plastic bag and place into trash. Cleanse the wound with Normal Saline prior to applying a clean dressing using gauze sponges, not tissues or cotton balls. Do not scrub or use excessive force. Pat dry using gauze sponges, not tissue or cotton balls. Prim Dressing: Xeroform 4x4-HBD (in/in) 2 x Per Week/30 Days ary Discharge Instructions: Apply Xeroform 4x4-HBD (in/in) as directed Secondary Dressing: ABD Pad 5x9 (in/in) 2 x Per Week/30 Days Discharge Instructions: Cover with ABD pad Secondary Dressing: Conforming Guaze Roll-Medium 2 x Per Week/30 Days Discharge Instructions: Apply Conforming Stretch Guaze Bandage as directed Secured With: Stretch Net Dressing, Latex-free, Size 5, Small-Head / Shoulder / Thigh 2 x Per Week/30 Days Wound #2 - Forearm Wound Laterality: Left Cleanser: Normal Saline 2 x Per Week/30 Days Discharge Instructions: Wash your hands with soap and water. Remove old dressing, discard into plastic bag and place into trash. Cleanse the wound with Normal Saline prior to applying a clean dressing using gauze sponges, not tissues or cotton balls. Do not scrub or use excessive force. Pat dry using gauze sponges, not tissue or cotton balls. Prim Dressing: Xeroform 4x4-HBD (in/in) 2 x Per Week/30 Days ary Discharge Instructions: Apply Xeroform 4x4-HBD (in/in) as directed Secondary Dressing: ABD Pad 5x9 (in/in) 2 x Per Week/30 Days Discharge Instructions: Cover with ABD pad Secondary Dressing: Conforming Guaze Roll-Medium 2 x Per Week/30 Days Discharge Instructions: Apply Conforming Stretch Guaze Bandage as directed Secured With: Stretch Net Dressing, Latex-free, Size 5, Small-Head / Shoulder / Thigh 2 x Per  Week/30 Days Electronic Signature(s) Unsigned Entered By: Levora Dredge on 04/15/2022 13:03:11 Signature(s): [name] ([id number] Date(s): [id number] Page 4 of 7 -------------------------------------------------------------------------------- Problem List Details Patient Name: Date of Service: [name], Christopher IN L. 04/15/2022 12:00 PM Medical Record Number: [id number] Patient Account Number: [id number] Date of Birth/Sex: Treating RN:  [birth year] (89 y.o. [name], [place] Primary Care Provider: Harrel Lemon Other Clinician: Referring Provider: Treating Provider/Extender: Sheryle Spray in Treatment: 2 Active Problems ICD-10 Encounter Code Description Active Date MDM Diagnosis S41.111A Laceration without foreign body of right upper arm, initial encounter 03/26/2022 No Yes Z79.01 Long term (current) use of anticoagulants 03/26/2022 No Yes I73.89 Other specified peripheral vascular diseases 03/26/2022 No Yes J44.9 Chronic obstructive pulmonary disease, unspecified 03/26/2022 No Yes Inactive Problems Resolved Problems Electronic Signature(s) Signed: 04/15/2022 12:01:52 PM By: Worthy Keeler PA-C Entered By: Worthy Keeler on 04/15/2022 12:01:52 -------------------------------------------------------------------------------- Progress Note Details Patient Name: Date of Service: [name], Christopher IN L. 04/15/2022 12:00 PM Medical Record Number: [id number] Patient Account Number: [id number] Date of Birth/Sex: Treating RN: September 28, 1928 (89 y.o. [name], [place] Primary Care Provider: Harrel Lemon Other Clinician: Referring Provider: Treating Provider/Extender: Sheryle Spray in Treatment: 2 Subjective Chief Complaint Information obtained from Patient Right arm skin tear History of Present Illness (HPI) 03/27/2022; patient presents for follow-up. He was seen yesterday in our clinic for the first time for a skin  tear to his right arm. He was recently hospitalized on 03/18/2018 for a right carotid artery stent. During that hospitalization he developed a skin tear and has had difficulty controlling the bleeding. He was seen in our clinic yesterday and devitalized tissue was taken off and the site cauterized with silver nitrate. Despite this he continues to have active bleeding soaking through dressings. He is currently on Eliquis and aspirin. He states this is prescribed by his cardiologist. Daughter notes that patient appears more weak than usual. Vitals are overall stable with no tachycardia. Patient is alert and oriented. [name], [name] ([id number]) [id number] Page 5 of 7 3/4 after the patient was here last week. She was cauterized but ended up in the hospital. During this admission the patient's wound wound dressing was not healed and she was discharged to return to clinic here. The original wound is on the right lateral arm. We noticed that there is a similar area on the left anterior arm which looks as though it seemed danger of bleeding but is not currently bleeding. The patient is on Eliquis. 3/7; this is a patient I saw earlier this week. Predominantly for a wound on the right anterior lower extremity. In passing we noted that she had a hematoma on the left we wrapped the arm. Unfortunately it is not held together. We used a #5 curette to remove the underlying bleeding. Fortunately this stopped. The area on the right lower extremity appears better 3/11; he has been using Xeroform to the wound beds without issue. 04-15-2022 upon evaluation today patient's wounds are actually significantly smaller compared to last time I saw him. This is actually very good news and overall I think that he is headed in the right direction. Fortunately I do not see any evidence of active infection locally or systemically which is great news and overall I am extremely pleased with where we  stand today. Objective Constitutional Well-nourished and well-hydrated in no acute distress. Vitals Time Taken: 12:08 PM, Height: 69 in, Weight: 138 lbs, BMI: 20.4, Temperature: 97.8 F, Pulse: 89 bpm, Respiratory Rate: 18 breaths/min, Blood Pressure: 121/72 mmHg. Respiratory normal breathing without difficulty. Psychiatric this patient is able to make decisions and demonstrates good insight into disease process. Alert and Oriented x 3. pleasant and cooperative. General Notes: Upon inspection patient's wound bed actually showed signs of excellent granulation and epithelization at this point. I  do believe that his wounds are both doing quite well no sharp debridement necessary and overall I think that he is moving in the right direction here. He seems to be feeling much better to which is also great news. Integumentary (Hair, Skin) Wound #1 status is Open. Original cause of wound was Trauma. The date acquired was: 03/20/2022. The wound has been in treatment 2 weeks. The wound is located on the Right Upper Arm. The wound measures 1cm length x 0.7cm width x 0.1cm depth; 0.55cm^2 area and 0.055cm^3 volume. There is Fat Layer (Subcutaneous Tissue) exposed. There is no tunneling or undermining noted. There is a medium amount of serosanguineous drainage noted. The wound margin is distinct with the outline attached to the wound base. There is large (67-100%) pink, pale granulation within the wound bed. There is a small (1-33%) amount of necrotic tissue within the wound bed including Adherent Slough. Wound #2 status is Open. Original cause of wound was Trauma. The date acquired was: 03/28/2022. The wound has been in treatment 1 weeks. The wound is located on the Left Forearm. The wound measures 1cm length x 0.5cm width x 0.1cm depth; 0.393cm^2 area and 0.039cm^3 volume. There is Fat Layer (Subcutaneous Tissue) exposed. There is no tunneling or undermining noted. There is a medium amount of serosanguineous  drainage noted. There is small (1- 33%) pink granulation within the wound bed. There is a large (67-100%) amount of necrotic tissue within the wound bed including Eschar. Assessment Active Problems ICD-10 Laceration without foreign body of right upper arm, initial encounter Long term (current) use of anticoagulants Other specified peripheral vascular diseases Chronic obstructive pulmonary disease, unspecified Plan Follow-up Appointments: Return Appointment in 1 week. Nurse Visit as needed - Thursday for dressing change Bathing/ Shower/ Hygiene: Wound #1 Right Upper Arm: Clean wound with Normal Saline or wound cleanser. Wash wounds with antibacterial soap and water. May shower; gently cleanse wound with antibacterial soap, rinse and pat dry prior to dressing wounds No tub bath. WOUND #1: - Upper Arm Wound Laterality: Right Cleanser: Normal Saline 2 x Per Week/30 Days Discharge Instructions: Wash your hands with soap and water. Remove old dressing, discard into plastic bag and place into trash. Cleanse the wound with Normal Saline prior to applying a clean dressing using gauze sponges, not tissues or cotton balls. Do not scrub or use excessive force. [address] [name], [name] ([id number]) [id number] Page 6 of 7 using gauze sponges, not tissue or cotton balls. Prim Dressing: Xeroform 4x4-HBD (in/in) 2 x Per Week/30 Days ary Discharge Instructions: Apply Xeroform 4x4-HBD (in/in) as directed Secondary Dressing: ABD Pad 5x9 (in/in) 2 x Per Week/30 Days Discharge Instructions: Cover with ABD pad Secondary Dressing: Conforming Guaze Roll-Medium 2 x Per Week/30 Days Discharge Instructions: Apply Conforming Stretch Guaze Bandage as directed Secured With: Stretch Net Dressing, Latex-free, Size 5, Small-Head / Shoulder / Thigh 2 x Per Week/30 Days WOUND #2: - Forearm Wound Laterality: Left Cleanser: Normal Saline 2 x Per Week/30 Days Discharge Instructions: Wash  your hands with soap and water. Remove old dressing, discard into plastic bag and place into trash. Cleanse the wound with Normal Saline prior to applying a clean dressing using gauze sponges, not tissues or cotton balls. Do not scrub or use excessive force. Pat dry using gauze sponges, not tissue or cotton balls. Prim Dressing: Xeroform 4x4-HBD (in/in) 2 x Per Week/30 Days ary Discharge Instructions: Apply Xeroform 4x4-HBD (in/in) as directed Secondary Dressing: ABD Pad 5x9 (in/in) 2 x Per Week/30 Days  Discharge Instructions: Cover with ABD pad Secondary Dressing: Conforming Guaze Roll-Medium 2 x Per Week/30 Days Discharge Instructions: Apply Conforming Stretch Guaze Bandage as directed Secured With: Stretch Net Dressing, Latex-free, Size 5, Small-Head / Shoulder / Thigh 2 x Per Week/30 Days 1. I am recommend that we have the patient continue to monitor for any signs of infection or worsening. Based on what I am seeing I do believe that we are on the right track and I am very pleased in that regard. 2. I am also going to suggest that we continue with the Xeroform gauze dressing which I think is really doing a great job here. 3. I am also can recommend that he continue with ABD pad to cover and the stretch net to secure in place. We will see patient back for reevaluation in 1 week here in the clinic. If anything worsens or changes patient will contact our office for additional recommendations. Electronic Signature(s) Signed: 04/15/2022 1:04:53 PM By: Worthy Keeler PA-C Entered By: Worthy Keeler on 04/15/2022 13:04:53 -------------------------------------------------------------------------------- SuperBill Details Patient Name: Date of Service: [name], Christopher IN L. 04/15/2022 Medical Record Number: [id number] Patient Account Number: [id number] Date of Birth/Sex: Treating RN: 04-10-1928 (89 y.o. Clide Dales Primary Care Provider: Harrel Lemon Other Clinician: Referring  Provider: Treating Provider/Extender: Sheryle Spray in Treatment: 2 Diagnosis Coding ICD-10 Codes Code Description [phone] Laceration without foreign body of right upper arm, initial encounter Z79.01 Long term (current) use of anticoagulants I73.89 Other specified peripheral vascular diseases J44.9 Chronic obstructive pulmonary disease, unspecified Facility Procedures : CPT4 Code: [id number] Description: [id number] - WOUND CARE VISIT-LEV 3 EST PT Modifier: Quantity: 1 Physician Procedures : CPT4 Code Description Modifier [id number] - WC PHYS LEVEL 3 - EST PT ICD-10 Diagnosis Description [name], [name] ([id number]) [id number] P S41.111A Laceration without foreign body of right upper arm, initial encounter  Z79.01 Long term (current) use of anticoagulants I73.89 Other specified peripheral vascular diseases J44.9 Chronic obstructive pulmonary disease, unspecified Quantity: 1 age 57 of 9 Electronic Signature(s) Signed: 04/15/2022 1:05:11 PM By: Worthy Keeler PA-C Entered By: Worthy Keeler on 04/15/2022 13:05:11

## 2022-04-16 NOTE — Telephone Encounter (Signed)
Spoke with the patient's daughter and the patient will arrive to [facility] at 8:00 am instead 1:30 pm. Patient and daughter were agreeable.

## 2022-04-18 ENCOUNTER — Other Ambulatory Visit: Payer: Self-pay

## 2022-04-18 ENCOUNTER — Encounter: Payer: Self-pay | Admitting: Vascular Surgery

## 2022-04-18 ENCOUNTER — Encounter (INDEPENDENT_AMBULATORY_CARE_PROVIDER_SITE_OTHER): Payer: Medicare Other

## 2022-04-18 ENCOUNTER — Ambulatory Visit (INDEPENDENT_AMBULATORY_CARE_PROVIDER_SITE_OTHER): Payer: Medicare Other | Admitting: Nurse Practitioner

## 2022-04-18 ENCOUNTER — Encounter
Admission: RE | Disposition: A | Discharge status: Home / Self Care | Payer: Self-pay | Source: Home / Self Care | Attending: Vascular Surgery

## 2022-04-18 ENCOUNTER — Ambulatory Visit
Admission: RE | Admit: 2022-04-18 | Discharge: 2022-04-18 | Disposition: A | Discharge status: Home / Self Care | Payer: Medicare Other | Attending: Vascular Surgery | Admitting: Vascular Surgery

## 2022-04-18 DIAGNOSIS — I70245 Atherosclerosis of native arteries of left leg with ulceration of other part of foot: Secondary | ICD-10-CM | POA: Diagnosis not present

## 2022-04-18 DIAGNOSIS — Z87891 Personal history of nicotine dependence: Secondary | ICD-10-CM | POA: Insufficient documentation

## 2022-04-18 DIAGNOSIS — I110 Hypertensive heart disease with heart failure: Secondary | ICD-10-CM | POA: Diagnosis not present

## 2022-04-18 DIAGNOSIS — L97529 Non-pressure chronic ulcer of other part of left foot with unspecified severity: Secondary | ICD-10-CM

## 2022-04-18 DIAGNOSIS — I5032 Chronic diastolic (congestive) heart failure: Secondary | ICD-10-CM | POA: Insufficient documentation

## 2022-04-18 DIAGNOSIS — I6521 Occlusion and stenosis of right carotid artery: Secondary | ICD-10-CM | POA: Diagnosis not present

## 2022-04-18 DIAGNOSIS — T82856A Stenosis of peripheral vascular stent, initial encounter: Secondary | ICD-10-CM | POA: Diagnosis not present

## 2022-04-18 DIAGNOSIS — L97909 Non-pressure chronic ulcer of unspecified part of unspecified lower leg with unspecified severity: Secondary | ICD-10-CM

## 2022-04-18 HISTORY — PX: LOWER EXTREMITY ANGIOGRAPHY: CATH118251

## 2022-04-18 LAB — CREATININE, SERUM
Creatinine, Ser: 1.19 mg/dL (ref 0.61–1.24)
GFR, Estimated: 58 mL/min — ABNORMAL LOW (ref >60)

## 2022-04-18 LAB — BUN: BUN: 24 mg/dL — ABNORMAL HIGH (ref 8–23)

## 2022-04-18 SURGERY — LOWER EXTREMITY ANGIOGRAPHY
Anesthesia: Moderate Sedation | Site: Leg Lower | Laterality: Left

## 2022-04-18 MED ORDER — HYDROMORPHONE HCL 1 MG/ML IJ SOLN: 1.0000 mg | Freq: Once | INTRAMUSCULAR | Status: DC | PRN

## 2022-04-18 MED ORDER — MIDAZOLAM HCL 2 MG/2ML IJ SOLN
INTRAMUSCULAR | Status: DC | PRN
  Administered 2022-04-18: 1 mg via INTRAVENOUS

## 2022-04-18 MED ORDER — ONDANSETRON HCL 4 MG/2ML IJ SOLN: 4.0000 mg | Freq: Four times a day (QID) | INTRAMUSCULAR | Status: DC | PRN

## 2022-04-18 MED ORDER — HEPARIN SODIUM (PORCINE) 1000 UNIT/ML IJ SOLN
INTRAMUSCULAR | Status: AC
  Filled 2022-04-18: qty 10

## 2022-04-18 MED ORDER — DIPHENHYDRAMINE HCL 50 MG/ML IJ SOLN: 50.0000 mg | Freq: Once | INTRAMUSCULAR | Status: DC | PRN

## 2022-04-18 MED ORDER — HEPARIN SODIUM (PORCINE) 1000 UNIT/ML IJ SOLN
INTRAMUSCULAR | Status: DC | PRN
  Administered 2022-04-18: 3000 [IU] via INTRAVENOUS

## 2022-04-18 MED ORDER — MIDAZOLAM HCL 2 MG/ML PO SYRP: 8.0000 mg | ORAL_SOLUTION | Freq: Once | ORAL | Status: DC | PRN

## 2022-04-18 MED ORDER — FENTANYL CITRATE PF 50 MCG/ML IJ SOSY
PREFILLED_SYRINGE | INTRAMUSCULAR | Status: AC
  Filled 2022-04-18: qty 1

## 2022-04-18 MED ORDER — FENTANYL CITRATE (PF) 100 MCG/2ML IJ SOLN
INTRAMUSCULAR | Status: DC | PRN
  Administered 2022-04-18: 25 ug via INTRAVENOUS

## 2022-04-18 MED ORDER — CEFAZOLIN SODIUM-DEXTROSE 2-4 GM/100ML-% IV SOLN
INTRAVENOUS | Status: AC
  Administered 2022-04-18: 2 g via INTRAVENOUS
  Filled 2022-04-18: qty 100

## 2022-04-18 MED ORDER — FAMOTIDINE 20 MG PO TABS: 40.0000 mg | ORAL_TABLET | Freq: Once | ORAL | Status: DC | PRN

## 2022-04-18 MED ORDER — SODIUM CHLORIDE 0.9 % IV SOLN: INTRAVENOUS | Status: DC

## 2022-04-18 MED ORDER — IODIXANOL 320 MG/ML IV SOLN
INTRAVENOUS | Status: DC | PRN
  Administered 2022-04-18: 40 mL

## 2022-04-18 MED ORDER — CEFAZOLIN SODIUM-DEXTROSE 2-4 GM/100ML-% IV SOLN: 2.0000 g | INTRAVENOUS | Status: AC

## 2022-04-18 MED ORDER — MIDAZOLAM HCL 2 MG/2ML IJ SOLN
INTRAMUSCULAR | Status: AC
  Filled 2022-04-18: qty 2

## 2022-04-18 MED ORDER — METHYLPREDNISOLONE SODIUM SUCC 125 MG IJ SOLR: 125.0000 mg | Freq: Once | INTRAMUSCULAR | Status: DC | PRN

## 2022-04-18 SURGICAL SUPPLY — 11 items
CANNULA 5F STIFF (CANNULA) IMPLANT
CATH ANGIO 5F PIGTAIL 65CM (CATHETERS) IMPLANT
CATH VS15FR (CATHETERS) IMPLANT
COVER PROBE ULTRASOUND 5X96 (MISCELLANEOUS) IMPLANT
DEVICE STARCLOSE SE CLOSURE (Vascular Products) IMPLANT
GLIDEWIRE ADV .035X260CM (WIRE) IMPLANT
PACK ANGIOGRAPHY (CUSTOM PROCEDURE TRAY) ×2 IMPLANT
SHEATH BRITE TIP 5FRX11 (SHEATH) IMPLANT
SYR MEDRAD MARK 7 150ML (SYRINGE) IMPLANT
TUBING CONTRAST HIGH PRESS 72 (TUBING) IMPLANT
WIRE EMERALD 3MM-J .035X150CM (WIRE) IMPLANT

## 2022-04-18 NOTE — Progress Notes (Signed)
Dr. Lucky Cowboy at bedside, speaking with pt. And is daughter [name] re: procedural results. Both verbalize understanding of conversation.

## 2022-04-18 NOTE — Interval H&P Note (Signed)
History and Physical Interval Note:  04/18/2022 7:43 AM  [name]  has presented today for surgery, with the diagnosis of LLE Angio   BARD   ASO w ulceration.  The various methods of treatment have been discussed with the patient and family. After consideration of risks, benefits and other options for treatment, the patient has consented to  Procedure(s): Lower Extremity Angiography (Left) as a surgical intervention.  The patient's history has been reviewed, patient examined, no change in status, stable for surgery.  I have reviewed the patient's chart and labs.  Questions were answered to the patient's satisfaction.     [name]

## 2022-04-18 NOTE — Op Note (Signed)
[facility]  Percutaneous Study/Intervention Procedural Note   Date of Surgery: 04/18/2022  Surgeon(s):Shishir Krantz    Assistants:none  Pre-operative Diagnosis: PAD with ulceration LLE  Post-operative diagnosis:  Same  Procedure(s) Performed:             1.  Ultrasound guidance for vascular access right femoral artery             2.  Catheter placement into left limb of aortobifemoral bypass graft analogous to the left iliac artery from right femoral approach             3.  Aortogram and selective left lower extremity angiogram             4.  StarClose closure device right femoral artery  EBL: 10 cc  Contrast: 40 cc  Fluoro Time: 2.1 minutes  Moderate Conscious Sedation Time: approximately 26 minutes using 1 mg of Versed and 25 mcg of Fentanyl              Indications:  Patient is a 89 y.o.male with a non-healing ulceration of the left foot. The patient has noninvasive study showing reduced flow in the left foot. The patient is brought in for angiography for further evaluation and potential treatment.  Due to the limb threatening nature of the situation, angiogram was performed for attempted limb salvage. The patient is aware that if the procedure fails, amputation would be expected.  The patient also understands that even with successful revascularization, amputation may still be required due to the severity of the situation.  Risks and benefits are discussed and informed consent is obtained.   Procedure:  The patient was identified and appropriate procedural time out was performed.  The patient was then placed supine on the table and prepped and draped in the usual sterile fashion. Moderate conscious sedation was administered during a face to face encounter with the patient throughout the procedure with my supervision of the RN administering medicines and monitoring the patient's vital signs, pulse oximetry, telemetry and mental status throughout from the start  of the procedure until the patient was taken to the recovery room. Ultrasound was used to evaluate the right common femoral artery.  It was patent .  A digital ultrasound image was acquired.  A Seldinger needle was used to access the right common femoral artery under direct ultrasound guidance and a permanent image was performed.  A 0.035 J wire was advanced without resistance and a 5Fr sheath was placed.  Pigtail catheter was placed into the aorta and an AP aortogram was performed. This demonstrated some degree of restenosis in the left renal artery stent.  There appeared to be 2 smaller right renal arteries without obvious stenosis.  The aortobifemoral bypass was widely patent without obvious stenosis. I then used a V S1 catheter to selectively cannulate the left limb of the aortobifemoral bypass graft perform selective left lower extremity imaging. Selective left lower extremity angiogram was then performed. This demonstrated a patent left femoral anastomosis without significant stenosis.  The profunda femoris artery was large and patent without significant stenosis and extensive collaterals.  The SFA had a flush occlusion.  The SFA was a tiny heavily calcified vessel and was occluded throughout the SFA and popliteal arteries.  The posterior tibial artery was the only runoff distally as it reconstituted in the proximal to mid segment and was diseased but was continuous distally.  His extensive collaterals fed this relatively briskly.  There were really no options for  endovascular revascularization in the situation.  His only revascularization option would be a femoral to distal bypass.  This would be a very difficult undertaking for a nonagenarian with multiple medical comorbidities and I am not sure he would be a candidate for this.  I elected to terminate the procedure. The sheath was removed and StarClose closure device was deployed in the right femoral artery with excellent hemostatic result. The patient was  taken to the recovery room in stable condition having tolerated the procedure well.  Findings:               Aortogram:  This demonstrated some degree of restenosis in the left renal artery stent.  There appeared to be 2 smaller right renal arteries without obvious stenosis.  The aortobifemoral bypass was widely patent without obvious stenosis             Left lower Extremity: This demonstrated a patent left femoral anastomosis without significant stenosis.  The profunda femoris artery was large and patent without significant stenosis and extensive collaterals.  The SFA had a flush occlusion.  The SFA was a tiny heavily calcified vessel and was occluded throughout the SFA and popliteal arteries.  The posterior tibial artery was the only runoff distally as it reconstituted in the proximal to mid segment and was diseased but was continuous distally.  His extensive collaterals fed this relatively briskly.   Disposition: Patient was taken to the recovery room in stable condition having tolerated the procedure well.  Complications: None  [name] 04/18/2022 10:11 AM   This note was created with Dragon Medical transcription system. Any errors in dictation are purely unintentional.

## 2022-04-19 ENCOUNTER — Encounter: Payer: Self-pay | Admitting: Vascular Surgery

## 2022-04-23 ENCOUNTER — Encounter: Payer: Medicare Other | Admitting: Physician Assistant

## 2022-04-23 DIAGNOSIS — S41111A Laceration without foreign body of right upper arm, initial encounter: Secondary | ICD-10-CM | POA: Diagnosis not present

## 2022-04-23 NOTE — Progress Notes (Signed)
MILIK, FABREGAS ([id number]) [id number] Page 1 of 5 Visit Report for 04/23/2022 Chief Complaint Document Details Patient Name: Date of Service: [name], Christopher IN L. 04/23/2022 12:00 PM Medical Record Number: [id number] Patient Account Number: [id number] Date of Birth/Sex: Treating RN: 10-Jun-1928 (89 y.o. Clide Dales Primary Care Provider: Harrel Lemon Other Clinician: Referring Provider: Treating Provider/Extender: Sheryle Spray in Treatment: 4 Information Obtained from: Patient Chief Complaint Right arm skin tear Electronic Signature(s) Signed: 04/23/2022 12:26:57 PM By: Worthy Keeler PA-C Entered By: Worthy Keeler on 04/23/2022 12:26:57 -------------------------------------------------------------------------------- HPI Details Patient Name: Date of Service: Christopher NFIELD, Christopher IN L. 04/23/2022 12:00 PM Medical Record Number: [id number] Patient Account Number: [id number] Date of Birth/Sex: Treating RN: 1928-04-17 (89 y.o. Clide Dales Primary Care Provider: Harrel Lemon Other Clinician: Referring Provider: Treating Provider/Extender: Sheryle Spray in Treatment: 4 History of Present Illness HPI Description: 03/27/2022; patient presents for follow-up. He was seen yesterday in our clinic for the first time for a skin tear to his right arm. He was recently hospitalized on 03/18/2018 for a right carotid artery stent. During that hospitalization he developed a skin tear and has had difficulty controlling the bleeding. He was seen in our clinic yesterday and devitalized tissue was taken off and the site cauterized with silver nitrate. Despite this he continues to have active bleeding soaking through dressings. He is currently on Eliquis and aspirin. He states this is prescribed by his cardiologist. Daughter notes that patient appears more weak than usual. Vitals are overall stable with no tachycardia. Patient  is alert and oriented. 3/4 after the patient was here last week. She was cauterized but ended up in the hospital. During this admission the patient's wound wound dressing was not healed and she was discharged to return to clinic here. The original wound is on the right lateral arm. We noticed that there is a similar area on the left anterior arm which looks as though it seemed danger of bleeding but is not currently bleeding. The patient is on Eliquis. 3/7; this is a patient I saw earlier this week. Predominantly for a wound on the right anterior lower extremity. In passing we noted that she had a hematoma on the left we wrapped the arm. Unfortunately it is not held together. We used a #5 curette to remove the underlying bleeding. Fortunately this stopped. The area on the right lower extremity appears better 3/11; he has been using Xeroform to the wound beds without issue. 04-15-2022 upon evaluation today patient's wounds are actually significantly smaller compared to last time I saw him. This is actually very Good news and overall I think that he is headed in the right direction. Fortunately I do not see any evidence of active infection locally or systemically which is great news and overall I am extremely pleased with where we stand today. 04-22-2021 upon evaluation today patient actually appears to be doing excellent at this time. In fact he does not show any signs of really have anything open he had a couple areas where there was a remaining scab that looks like it started to try to lift off but did not quite want to come I am not going to force it especially in light of the significant bleeding that has had previous I do not think it is worth it. Fortunately there does not appear to be any signs of an opening though at this point. Electronic Signature(s) Signed: 04/23/2022 12:42:18 PM By: Worthy Keeler  PA-C Entered By: Worthy Keeler on 04/23/2022  12:42:18 -------------------------------------------------------------------------------- Physical Exam Details Patient Name: Date of Service: Christopher NFIELD, Christopher IN L. 04/23/2022 12:00 PM Medical Record Number: [id number] Patient Account Number: [id number] Date of Birth/Sex: Treating RN: [birth year] (89 y.o. Clide Dales Primary Care Provider: Harrel Lemon Other Clinician: [name], [name] ([id number]) [id number] Page 2 of 5 Referring Provider: Treating Provider/Extender: Estill Dooms, Moses Manners in Treatment: 4 Constitutional Well-nourished and well-hydrated in no acute distress. Psychiatric this patient is able to make decisions and demonstrates Good insight into disease process. Alert and Oriented x 3. pleasant and cooperative. Notes Upon inspection patient's wound bed actually showed signs of Good granulation epithelization at this point. I am actually extremely pleased with where we are and I think he is ready for discharge. Electronic Signature(s) Signed: 04/23/2022 12:42:32 PM By: Worthy Keeler PA-C Entered By: Worthy Keeler on 04/23/2022 12:42:32 -------------------------------------------------------------------------------- Physician Orders Details Patient Name: Date of Service: Christopher NFIELD, Christopher IN L. 04/23/2022 12:00 PM Medical Record Number: [id number] Patient Account Number: [id number] Date of Birth/Sex: Treating RN: Dec 27, 1928 (89 y.o. Clide Dales Primary Care Provider: Harrel Lemon Other Clinician: Referring Provider: Treating Provider/Extender: Sheryle Spray in Treatment: 4 Verbal / Phone Orders: No Diagnosis Coding ICD-10 Coding Code Description [id number] Laceration without foreign body of right upper arm, initial encounter Z79.01 Long term (current) use of anticoagulants I73.89 Other specified peripheral vascular diseases J44.9 Chronic obstructive pulmonary disease, unspecified Discharge  From [facility] Discharge from [facility] - Please call if any issues arise. ABD pad and stretch net recommended to protect area. Allow scabs to fall off on their own. Derma saver for bilateral arms recommended and print outs given to daughter. Electronic Signature(s) Signed: 04/23/2022 12:57:58 PM By: Levora Dredge Signed: 04/23/2022 6:13:56 PM By: Worthy Keeler PA-C Entered By: Levora Dredge on 04/23/2022 12:57:57 -------------------------------------------------------------------------------- Problem List Details Patient Name: Date of Service: Christopher NFIELD, Christopher IN L. 04/23/2022 12:00 PM Medical Record Number: [id number] Patient Account Number: [id number] Date of Birth/Sex: Treating RN: Mar 26, 1928 (89 y.o. Clide Dales Primary Care Provider: Harrel Lemon Other Clinician: Referring Provider: Treating Provider/Extender: Sheryle Spray in Treatment: 4 Active Problems ICD-10 Encounter Code Description Active Date MDM Diagnosis S41.111A Laceration without foreign body of right upper arm, initial encounter 03/26/2022 No Yes Z79.01 Long term (current) use of anticoagulants 03/26/2022 No Yes [name], [name] ([id number]) [id number] Page 3 of 5 I73.89 Other specified peripheral vascular diseases 03/26/2022 No Yes J44.9 Chronic obstructive pulmonary disease, unspecified 03/26/2022 No Yes Inactive Problems Resolved Problems Electronic Signature(s) Signed: 04/23/2022 12:26:55 PM By: Worthy Keeler PA-C Entered By: Worthy Keeler on 04/23/2022 12:26:54 -------------------------------------------------------------------------------- Progress Note Details Patient Name: Date of Service: Christopher NFIELD, Christopher IN L. 04/23/2022 12:00 PM Medical Record Number: [id number] Patient Account Number: [id number] Date of Birth/Sex: Treating RN: December 31, 1928 (89 y.o. Clide Dales Primary Care Provider: Harrel Lemon Other  Clinician: Referring Provider: Treating Provider/Extender: Sheryle Spray in Treatment: 4 Subjective Chief Complaint Information obtained from Patient Right arm skin tear History of Present Illness (HPI) 03/27/2022; patient presents for follow-up. He was seen yesterday in our clinic for the first time for a skin tear to his right arm. He was recently hospitalized on 03/18/2018 for a right carotid artery stent. During that hospitalization he developed a skin tear and has had difficulty controlling the bleeding. He was seen in our clinic yesterday and devitalized tissue was taken  off and the site cauterized with silver nitrate. Despite this he continues to have active bleeding soaking through dressings. He is currently on Eliquis and aspirin. He states this is prescribed by his cardiologist. Daughter notes that patient appears more weak than usual. Vitals are overall stable with no tachycardia. Patient is alert and oriented. 3/4 after the patient was here last week. She was cauterized but ended up in the hospital. During this admission the patient's wound wound dressing was not healed and she was discharged to return to clinic here. The original wound is on the right lateral arm. We noticed that there is a similar area on the left anterior arm which looks as though it seemed danger of bleeding but is not currently bleeding. The patient is on Eliquis. 3/7; this is a patient I saw earlier this week. Predominantly for a wound on the right anterior lower extremity. In passing we noted that she had a hematoma on the left we wrapped the arm. Unfortunately it is not held together. We used a #5 curette to remove the underlying bleeding. Fortunately this stopped. The area on the right lower extremity appears better 3/11; he has been using Xeroform to the wound beds without issue. 04-15-2022 upon evaluation today patient's wounds are actually significantly smaller compared to last time I saw  him. This is actually very Good news and overall I think that he is headed in the right direction. Fortunately I do not see any evidence of active infection locally or systemically which is great news and overall I am extremely pleased with where we stand today. 04-22-2021 upon evaluation today patient actually appears to be doing excellent at this time. In fact he does not show any signs of really have anything open he had a couple areas where there was a remaining scab that looks like it started to try to lift off but did not quite want to come I am not going to force it especially in light of the significant bleeding that has had previous I do not think it is worth it. Fortunately there does not appear to be any signs of an opening though at this point. Objective Constitutional Well-nourished and well-hydrated in no acute distress. Vitals Time Taken: 12:13 PM, Height: 69 in, Weight: 138 lbs, BMI: 20.4, Temperature: 98 F, Pulse: 61 bpm, Respiratory Rate: 18 breaths/min, Blood Pressure: 115/53 mmHg, Pulse Oximetry: 3 %. [name], [name] ([id number]) [id number] Page 4 of 5 Psychiatric this patient is able to make decisions and demonstrates Good insight into disease process. Alert and Oriented x 3. pleasant and cooperative. General Notes: Upon inspection patient's wound bed actually showed signs of Good granulation epithelization at this point. I am actually extremely pleased with where we are and I think he is ready for discharge. Integumentary (Hair, Skin) Wound #1 status is Open. Original cause of wound was Trauma. The date acquired was: 03/20/2022. The wound has been in treatment 4 weeks. The wound is located on the Right Upper Arm. The wound measures 0.5cm length x 0.2cm width x 0.1cm depth; 0.079cm^2 area and 0.008cm^3 volume. There is no tunneling or undermining noted. There is a none present amount of drainage noted. The wound margin is distinct with the  outline attached to the wound base. There is no granulation within the wound bed. There is a large (67-100%) amount of necrotic tissue within the wound bed including Eschar. Wound #2 status is Open. Original cause of wound was Trauma. The date acquired was: 03/28/2022. The wound has  been in treatment 2 weeks. The wound is located on the Left Forearm. The wound measures 1cm length x 0.2cm width x 0.1cm depth; 0.157cm^2 area and 0.016cm^3 volume. There is no tunneling or undermining noted. There is a none present amount of drainage noted. There is no granulation within the wound bed. There is a large (67-100%) amount of necrotic tissue within the wound bed including Eschar. Assessment Active Problems ICD-10 Laceration without foreign body of right upper arm, initial encounter Long term (current) use of anticoagulants Other specified peripheral vascular diseases Chronic obstructive pulmonary disease, unspecified Plan 1. I am going to suggest that we have the patient continue to monitor for any signs of infection or worsening. Based on what I am seeing I do believe that he is completely healed I think that we can use just an ABD pad and stretch net to cover over the areas for protection until his daughter is able to purchase the derma savers which I think would actually be the best thing for him. They are in agreement with that plan. In the meantime I think longsleeve shirts would also be beneficial for him until he obtains the derma saver. I am going to suggest that we go ahead and see the patient back just on an as-needed basis at this point if anything changes he knows they can definitely contact the office and let me know otherwise we will see where he stands only as an as-needed basis at this point. I am very pleased that he is healed. Electronic Signature(s) Signed: 04/23/2022 12:43:42 PM By: Worthy Keeler PA-C Entered By: Worthy Keeler on 04/23/2022  12:43:42 -------------------------------------------------------------------------------- SuperBill Details Patient Name: Date of Service: Christopher NFIELD, Christopher IN L. 04/23/2022 Medical Record Number: [id number] Patient Account Number: [id number] Date of Birth/Sex: Treating RN: [birth year] (89 y.o. Clide Dales Primary Care Provider: Harrel Lemon Other Clinician: Referring Provider: Treating Provider/Extender: Sheryle Spray in Treatment: 4 Diagnosis Coding ICD-10 Codes Code Description [id number] Laceration without foreign body of right upper arm, initial encounter Z79.01 Long term (current) use of anticoagulants I73.89 Other specified peripheral vascular diseases J44.9 Chronic obstructive pulmonary disease, unspecified Facility Procedures : CPT4 Code: [id number] Description: [id number] - WOUND CARE VISIT-LEV 3 EST PT Modifier: Quantity: 1 Physician Procedures [name], [name] ([id number]): CPT4 Code Description [id number] - WC PHYS LEVEL 3 - EST PT ICD-10 Diagnosis Description S41.111A Laceration without foreign body of right upper arm, initial enco Z79.01 Long term (current) use of anticoagulants I73.89  Other specified peripheral vascular diseases J44.9 Chronic obstructive pulmonary disease, unspecified [id number] Page 5 of 5: Quantity Modifier 1 Personal assistant) Signed: 04/23/2022 12:58:43 PM By: Levora Dredge Signed: 04/23/2022 6:13:56 PM By: Worthy Keeler PA-C Previous Signature: 04/23/2022 12:45:49 PM Version By: Worthy Keeler PA-C Entered By: Levora Dredge on 04/23/2022 12:58:43

## 2022-04-24 NOTE — Progress Notes (Signed)
TABORIS, TEDROW ([id number]) [id number] Page 1 of 8 Visit Report for 04/23/2022 Arrival Information Details Patient Name: Date of Service: Bretta Bang, [name] 04/23/2022 12:00 PM Medical Record Number: [id number] Patient Account Number: [id number] Date of Birth/Sex: Treating RN: 04/21/1928 (89 y.o. Clide Dales Primary Care Darreld Hoffer: Harrel Lemon Other Clinician: Referring Namiyah Grantham: Treating Tersea Aulds/Extender: Sheryle Spray in Treatment: 4 Visit Information History Since Last Visit Added or deleted any medications: No Patient Arrived: Wheel Chair Any new allergies or adverse reactions: No Arrival Time: 12:12 Had a fall or experienced change in No Accompanied By: daughter activities of daily living that may affect Transfer Assistance: None risk of falls: Patient Identification Verified: Yes Hospitalized since last visit: No Secondary Verification Process Completed: Yes Has Dressing in Place as Prescribed: Yes Patient Has Alerts: Yes Pain Present Now: No Patient Alerts: Patient on Blood Thinner 81mg  aspirin Eliquis NOT DIABETIC Electronic Signature(s) Signed: 04/23/2022 4:21:05 PM By: Levora Dredge Entered By: Levora Dredge on 04/23/2022 12:13:05 -------------------------------------------------------------------------------- Clinic Level of Care Assessment Details Patient Name: Date of Service: Bretta Bang, [name] 04/23/2022 12:00 PM Medical Record Number: [id number] Patient Account Number: [id number] Date of Birth/Sex: Treating RN: 10/23/1928 (89 y.o. Clide Dales Primary Care Adelin Ventrella: Harrel Lemon Other Clinician: Referring Song Garris: Treating Shernell Saldierna/Extender: Sheryle Spray in Treatment: 4 Clinic Level of Care Assessment Items TOOL 4 Quantity Score []  - 0 Use when only an EandM is performed on FOLLOW-UP visit ASSESSMENTS - Nursing Assessment / Reassessment X- 1 10 Reassessment  of Co-morbidities (includes updates in patient status) X- 1 5 Reassessment of Adherence to Treatment Plan ASSESSMENTS - Wound and Skin A ssessment / Reassessment []  - 0 Simple Wound Assessment / Reassessment - one wound X- 2 5 Complex Wound Assessment / Reassessment - multiple wounds []  - 0 Dermatologic / Skin Assessment (not related to wound area) ASSESSMENTS - Focused Assessment []  - 0 Circumferential Edema Measurements - multi extremities []  - 0 Nutritional Assessment / Counseling / Intervention []  - 0 Lower Extremity Assessment (monofilament, tuning fork, pulses) []  - 0 Peripheral Arterial Disease Assessment (using hand held doppler) ASSESSMENTS - Ostomy and/or Continence Assessment and Care []  - 0 Incontinence Assessment and Management []  - 0 Ostomy Care Assessment and Management (repouching, etc.) PROCESS - Coordination of Care X - Simple Patient / Family Education for ongoing care 1 15 SAYVON, SALAZ ([id number]) [id number] Page 2 of 8 []  - 0 Complex (extensive) Patient / Family Education for ongoing care []  - 0 Staff obtains Programmer, systems, Records, T Results / Process Orders est []  - 0 Staff telephones HHA, Nursing Homes / Clarify orders / etc []  - 0 Routine Transfer to another Facility (non-emergent condition) []  - 0 Routine Hospital Admission (non-emergent condition) []  - 0 New Admissions / Biomedical engineer / Ordering NPWT Apligraf, etc. , []  - 0 Emergency Hospital Admission (emergent condition) X- 1 10 Simple Discharge Coordination []  - 0 Complex (extensive) Discharge Coordination PROCESS - Special Needs []  - 0 Pediatric / Minor Patient Management []  - 0 Isolation Patient Management []  - 0 Hearing / Language / Visual special needs []  - 0 Assessment of Community assistance (transportation, D/C planning, etc.) []  - 0 Additional assistance / Altered mentation []  - 0 Support Surface(s) Assessment (bed, cushion, seat,  etc.) INTERVENTIONS - Wound Cleansing / Measurement []  - 0 Simple Wound Cleansing - one wound X- 2 5 Complex Wound Cleansing - multiple wounds X- 1 5 Wound Imaging (photographs - any number  of wounds) []  - 0 Wound Tracing (instead of photographs) []  - 0 Simple Wound Measurement - one wound X- 2 5 Complex Wound Measurement - multiple wounds INTERVENTIONS - Wound Dressings X - Small Wound Dressing one or multiple wounds 2 10 []  - 0 Medium Wound Dressing one or multiple wounds []  - 0 Large Wound Dressing one or multiple wounds []  - 0 Application of Medications - topical []  - 0 Application of Medications - injection INTERVENTIONS - Miscellaneous []  - 0 External ear exam []  - 0 Specimen Collection (cultures, biopsies, blood, body fluids, etc.) []  - 0 Specimen(s) / Culture(s) sent or taken to Lab for analysis []  - 0 Patient Transfer (multiple staff / Civil Service fast streamer / Similar devices) []  - 0 Simple Staple / Suture removal (25 or less) []  - 0 Complex Staple / Suture removal (26 or more) []  - 0 Hypo / Hyperglycemic Management (close monitor of Blood Glucose) []  - 0 Ankle / Brachial Index (ABI) - do not check if billed separately X- 1 5 Vital Signs Has the patient been seen at the hospital within the last three years: Yes Total Score: 100 Level Of Care: New/Established - Level 3 Electronic Signature(s) Signed: 04/23/2022 4:21:05 PM By: Levora Dredge Entered By: Levora Dredge on 04/23/2022 12:58:34 [name] ([id number]) [id number] Page 3 of 8 -------------------------------------------------------------------------------- Encounter Discharge Information Details Patient Name: Date of Service: Bretta Bang, [name] 04/23/2022 12:00 PM Medical Record Number: [id number] Patient Account Number: [id number] Date of Birth/Sex: Treating RN: Jun 19, 1928 (89 y.o. Clide Dales Primary Care Suellyn Meenan: Harrel Lemon Other Clinician: Referring  Richar Dunklee: Treating Jomayra Novitsky/Extender: Sheryle Spray in Treatment: 4 Encounter Discharge Information Items Discharge Condition: Stable Ambulatory Status: Wheelchair Discharge Destination: Home Transportation: Private Auto Accompanied By: daughter Schedule Follow-up Appointment: No Clinical Summary of Care: Electronic Signature(s) Signed: 04/23/2022 12:59:50 PM By: Levora Dredge Entered By: Levora Dredge on 04/23/2022 12:59:50 -------------------------------------------------------------------------------- Lower Extremity Assessment Details Patient Name: Date of Service: [name], [name] 04/23/2022 12:00 PM Medical Record Number: [id number] Patient Account Number: [id number] Date of Birth/Sex: Treating RN: 12-10-1928 (89 y.o. Clide Dales Primary Care Yanni Ruberg: Harrel Lemon Other Clinician: Referring Shilo Philipson: Treating Anquinette Pierro/Extender: Sheryle Spray in Treatment: 4 Electronic Signature(s) Signed: 04/23/2022 4:21:05 PM By: Levora Dredge Entered By: Levora Dredge on 04/23/2022 12:21:25 -------------------------------------------------------------------------------- Multi Wound Chart Details Patient Name: Date of Service: STA [name], [name] 04/23/2022 12:00 PM Medical Record Number: [id number] Patient Account Number: [id number] Date of Birth/Sex: Treating RN: [birth year] (89 y.o. Clide Dales Primary Care Edmar Blankenburg: Harrel Lemon Other Clinician: Referring Sultana Tierney: Treating Kolby Myung/Extender: Sheryle Spray in Treatment: 4 Vital Signs Height(in): 69 Pulse(bpm): 61 Weight(lbs): 138 Blood Pressure(mmHg): 115/53 Body Mass Index(BMI): 20.4 Temperature(F): 98 Respiratory Rate(breaths/min): 18 [1:Photos:] [N/A:N/A] Right Upper Arm Left Forearm N/A Wound Location: KERAUN, AARON ([id number]) [id number] Page 4 of 8 Trauma Trauma N/A Wounding Event: Skin T ear  Trauma, Other N/A Primary Etiology: Chronic Obstructive Pulmonary Chronic Obstructive Pulmonary N/A Comorbid History: Disease (COPD), Arrhythmia, Disease (COPD), Arrhythmia, Congestive Heart Failure, Coronary Congestive Heart Failure, Coronary Artery Disease, Hypertension, Artery Disease, Hypertension, Peripheral Arterial Disease Peripheral Arterial Disease 03/20/2022 03/28/2022 N/A Date Acquired: 4 2 N/A Weeks of Treatment: Healed - Epithelialized Healed - Epithelialized N/A Wound Status: No No N/A Wound Recurrence: 0x0x0 0x0x0 N/A Measurements L x W x D (cm) 0 0 N/A A (cm) : rea 0 0 N/A Volume (cm) : 100.00% 100.00% N/A % Reduction in A rea:  100.00% 100.00% N/A % Reduction in Volume: Partial Thickness Partial Thickness N/A Classification: None Present None Present N/A Exudate A mount: Distinct, outline attached N/A N/A Wound Margin: None Present (0%) None Present (0%) N/A Granulation A mount: Large (67-100%) Large (67-100%) N/A Necrotic A mount: Eschar Eschar N/A Necrotic Tissue: Fascia: No Fat Layer (Subcutaneous Tissue): No N/A Exposed Structures: Fat Layer (Subcutaneous Tissue): No Tendon: No Muscle: No Joint: No Bone: No Medium (34-66%) Small (1-33%) N/A Epithelialization: Treatment Notes Electronic Signature(s) Signed: 04/23/2022 12:56:32 PM By: Levora Dredge Entered By: Levora Dredge on 04/23/2022 12:56:32 -------------------------------------------------------------------------------- Multi-Disciplinary Care Plan Details Patient Name: Date of Service: STA [name], [name] 04/23/2022 12:00 PM Medical Record Number: [id number] Patient Account Number: [id number] Date of Birth/Sex: Treating RN: 1928-03-07 (89 y.o. Clide Dales Primary Care Kasyn Stouffer: Harrel Lemon Other Clinician: Referring Jrake Rodriquez: Treating Aron Needles/Extender: Sheryle Spray in Treatment: 4 Active Inactive Electronic Signature(s) Signed: 04/23/2022  12:59:02 PM By: Levora Dredge Entered By: Levora Dredge on 04/23/2022 12:59:02 -------------------------------------------------------------------------------- Pain Assessment Details Patient Name: Date of Service: [name], [name] 04/23/2022 12:00 PM Medical Record Number: [id number] Patient Account Number: [id number] Date of Birth/Sex: Treating RN: Oct 07, 1928 (89 y.o. Clide Dales Primary Care Jodiann Ognibene: Harrel Lemon Other Clinician: Referring Jermal Dismuke: Treating Jahmeer Porche/Extender: Sheryle Spray in Treatment: 4 Active Problems Location of Pain Severity and Description of Pain Patient Has Paino No Site Locations Rate the pain. ADON, SENN ([id number]) [id number] Page 5 of 8 Rate the pain. Current Pain Level: 0 Pain Management and Medication Current Pain Management: Electronic Signature(s) Signed: 04/23/2022 4:21:05 PM By: Levora Dredge Entered By: Levora Dredge on 04/23/2022 12:13:36 -------------------------------------------------------------------------------- Patient/Caregiver Education Details Patient Name: Date of Service: STA [name], [name] 3/26/2024andnbsp12:00 PM Medical Record Number: [id number] Patient Account Number: [id number] Date of Birth/Gender: Treating RN: [birth year] (89 y.o. Clide Dales Primary Care Physician: Harrel Lemon Other Clinician: Referring Physician: Treating Physician/Extender: Sheryle Spray in Treatment: 4 Education Assessment Education Provided To: Patient and Caregiver Education Topics Provided Wound/Skin Impairment: Handouts: Other: care of healed wounds Methods: Explain/Verbal, Printed Responses: State content correctly Electronic Signature(s) Signed: 04/23/2022 4:21:05 PM By: Levora Dredge Entered By: Levora Dredge on 04/23/2022 12:59:18 -------------------------------------------------------------------------------- Wound  Assessment Details Patient Name: Date of Service: [name], [name] 04/23/2022 12:00 PM Medical Record Number: [id number] Patient Account Number: [id number] Date of Birth/Sex: Treating RN: December 01, 1928 (89 y.o. Clide Dales Primary Care Brandley Aldrete: Harrel Lemon Other Clinician: Referring Maurissa Ambrose: Treating Kimani Bedoya/Extender: Sheryle Spray in Treatment: 4 Wound Status Wound Number: 1 Primary Skin Tear Etiology: Wound Location: Right Upper Arm Wound Healed - Epithelialized Wounding Event: Trauma Status: Date Acquired: 03/20/2022 Comorbid Chronic Obstructive Pulmonary Disease (COPD), Arrhythmia, Weeks Of Treatment: 4 REED, DIDOMENICO ([id number]) [id number] Page 6 of 8 Weeks Of Treatment: 4 History: Congestive Heart Failure, Coronary Artery Disease, Hypertension, Clustered Wound: No Peripheral Arterial Disease Photos Wound Measurements Length: (cm) Width: (cm) Depth: (cm) Area: (cm) Volume: (cm) 0 % Reduction in Area: 100% 0 % Reduction in Volume: 100% 0 Epithelialization: Medium (34-66%) 0 Tunneling: No 0 Undermining: No Wound Description Classification: Partial Thickness Wound Margin: Distinct, outline attached Exudate Amount: None Present Foul Odor After Cleansing: No Slough/Fibrino Yes Wound Bed Granulation Amount: None Present (0%) Exposed Structure Necrotic Amount: Large (67-100%) Fascia Exposed: No Necrotic Quality: Eschar Fat Layer (Subcutaneous Tissue) Exposed: No Tendon Exposed: No Muscle Exposed: No Joint Exposed: No Bone Exposed: No Treatment Notes Wound #1 (Upper Arm)  Wound Laterality: Right Cleanser Peri-Wound Care Topical Primary Dressing Secondary Dressing Secured With Compression Wrap Compression Stockings Add-Ons Electronic Signature(s) Signed: 04/23/2022 4:21:05 PM By: Levora Dredge Signed: 04/23/2022 6:13:56 PM By: Worthy Keeler PA-C Entered By: Worthy Keeler on 04/23/2022  12:43:01 -------------------------------------------------------------------------------- Wound Assessment Details Patient Name: Date of Service: [name], [name] 04/23/2022 12:00 PM Medical Record Number: [id number] Patient Account Number: [id number] Date of Birth/Sex: Treating RN: 1928-04-04 (89 y.o. Clide Dales Primary Care Jodee Wagenaar: Harrel Lemon Other Clinician: Referring Rashay Barnette: Treating Zeah Germano/Extender: Sheryle Spray in Treatment: [place], Ocie Cornfield ([id number]) [id number] Page 7 of 8 Wound Status Wound Number: 2 Primary Trauma, Other Etiology: Wound Location: Left Forearm Wound Healed - Epithelialized Wounding Event: Trauma Status: Date Acquired: 03/28/2022 Notes: pt wasnt sure, states may have hit it on something Weeks Of Treatment: 2 Comorbid Chronic Obstructive Pulmonary Disease (COPD), Arrhythmia, Clustered Wound: No History: Congestive Heart Failure, Coronary Artery Disease, Hypertension, Peripheral Arterial Disease Photos Wound Measurements Length: (cm) Width: (cm) Depth: (cm) Area: (cm) Volume: (cm) 0 % Reduction in Area: 100% 0 % Reduction in Volume: 100% 0 Epithelialization: Small (1-33%) 0 Tunneling: No 0 Undermining: No Wound Description Classification: Partial Thickness Exudate Amount: None Present Foul Odor After Cleansing: No Slough/Fibrino Yes Wound Bed Granulation Amount: None Present (0%) Exposed Structure Necrotic Amount: Large (67-100%) Fat Layer (Subcutaneous Tissue) Exposed: No Necrotic Quality: Eschar Treatment Notes Wound #2 (Forearm) Wound Laterality: Left Cleanser Peri-Wound Care Topical Primary Dressing Secondary Dressing Secured With Compression Wrap Compression Stockings Add-Ons Electronic Signature(s) Signed: 04/23/2022 4:21:05 PM By: Levora Dredge Signed: 04/23/2022 6:13:56 PM By: Worthy Keeler PA-C Entered By: Worthy Keeler on 04/23/2022  12:43:02 -------------------------------------------------------------------------------- Vitals Details Patient Name: Date of Service: [name], [name] 04/23/2022 12:00 PM Medical Record Number: [id number] Patient Account Number: [id number] Date of Birth/Sex: Treating RN: Jun 26, 1928 (89 y.o. Clide Dales Primary Care Nahiara Kretzschmar: Harrel Lemon Other Clinician: ITIEL, BORNER ([id number]) [id number] Page 8 of 8 Referring Tavi Hoogendoorn: Treating Madilynne Mullan/Extender: Estill Dooms, Moses Manners in Treatment: 4 Vital Signs Time Taken: 12:13 Temperature (F): 98 Height (in): 69 Pulse (bpm): 61 Weight (lbs): 138 Respiratory Rate (breaths/min): 18 Body Mass Index (BMI): 20.4 Blood Pressure (mmHg): 115/53 Reference Range: 80 - 120 mg / dl Airway Pulse Oximetry (%): 3 Electronic Signature(s) Signed: 04/23/2022 4:21:05 PM By: Levora Dredge Entered By: Levora Dredge on 04/23/2022 12:13:29

## 2022-04-25 ENCOUNTER — Ambulatory Visit: Payer: Medicare Other | Admitting: Dermatology

## 2022-04-26 ENCOUNTER — Encounter
Admission: RE | Admit: 2022-04-26 | Discharge: 2022-04-26 | Disposition: A | Discharge status: Home / Self Care | Payer: Medicare Other | Source: Ambulatory Visit | Attending: Podiatry | Admitting: Podiatry

## 2022-04-26 HISTORY — DX: Pneumonia due to coronavirus disease 2019: J12.82

## 2022-04-26 HISTORY — DX: COVID-19: U07.1

## 2022-04-26 HISTORY — DX: Sepsis, unspecified organism: A41.9

## 2022-04-26 HISTORY — DX: Occlusion and stenosis of right carotid artery: I65.21

## 2022-04-26 HISTORY — DX: Other complications of anesthesia, initial encounter: T88.59XA

## 2022-04-26 HISTORY — DX: Chronic kidney disease, stage 3a: N18.31

## 2022-04-26 HISTORY — DX: Pneumonitis due to inhalation of food and vomit: J69.0

## 2022-04-26 HISTORY — DX: Unspecified atrial fibrillation: I48.91

## 2022-04-26 HISTORY — DX: Osteomyelitis, unspecified: M86.9

## 2022-04-26 HISTORY — DX: Peripheral vascular disease, unspecified: I73.9

## 2022-04-26 HISTORY — DX: Central retinal artery occlusion, left eye: H34.12

## 2022-04-26 NOTE — Patient Instructions (Addendum)
Your procedure is scheduled on: Friday, April 5 Report to the Registration Desk on the 1st floor of the [facility]. To find out your arrival time, please call [phone] between 1PM - 3PM on: Thursday, April 4 If your arrival time is 6:00 am, do not arrive before that time as the [facility] entrance doors do not open until 6:00 am.  REMEMBER: Instructions that are not followed completely may result in serious medical risk, up to and including death; or upon the discretion of your surgeon and anesthesiologist your surgery may need to be rescheduled.  Do not eat food after midnight the night before surgery.  No gum chewing or hard candies.  You may however, drink CLEAR liquids up to 2 hours before you are scheduled to arrive for your surgery. Do not drink anything within 2 hours of your scheduled arrival time.  Clear liquids include: - water  - apple juice without pulp - gatorade (not RED colors) - black coffee or tea (Do NOT add milk or creamers to the coffee or tea) Do NOT drink anything that is not on this list.  One week prior to surgery: starting March 29 Stop Anti-inflammatories (NSAIDS) such as Advil, Aleve, Ibuprofen, Motrin, Naproxen, Naprosyn and Aspirin based products such as Excedrin, Goody's Powder, BC Powder. Stop ANY OVER THE COUNTER supplements until after surgery. Stop vitamin C, benefiber probiotic, calcium, multiple vitamin You may however, continue to take Tylenol if needed for pain up until the day of surgery.  Continue taking all prescribed medications with the exception of the following:  Eliquis (apixaban) - per Dr. [name], hold 2 days before surgery. Last day to take is April 2, evening dose; continue taking the aspirin.  TAKE ONLY THESE MEDICATIONS THE MORNING OF SURGERY WITH A SIP OF WATER:  Albuterol nebulizer Cartia Trelegy inhaler Metoprolol Prednisone Tramadol if needed for pain  No Alcohol for 24 hours before or after surgery.  No Smoking  including e-cigarettes for 24 hours before surgery.  No chewable tobacco products for at least 6 hours before surgery.  No nicotine patches on the day of surgery.  Do not use any "recreational" drugs for at least a week (preferably 2 weeks) before your surgery.  Please be advised that the combination of cocaine and anesthesia may have negative outcomes, up to and including death. If you test positive for cocaine, your surgery will be cancelled.  On the morning of surgery brush your teeth with toothpaste and water, you may rinse your mouth with mouthwash if you wish. Do not swallow any toothpaste or mouthwash.  Use CHG wipes as directed on instruction sheet.  Do not wear jewelry.  Do not wear lotions, powders, or perfumes.   Do not shave body hair from the neck down 48 hours before surgery.  Contact lenses, hearing aids and dentures may not be worn into surgery.  Do not bring valuables to the hospital. [facility] is not responsible for any missing/lost belongings or valuables.   Notify your doctor if there is any change in your medical condition (cold, fever, infection).  Wear comfortable clothing (specific to your surgery type) to the hospital.  After surgery, you can help prevent lung complications by doing breathing exercises.  Take deep breaths and cough every 1-2 hours. Your doctor may order a device called an Incentive Spirometer to help you take deep breaths.  If you are being discharged the day of surgery, you will not be allowed to drive home. You will need a  responsible individual to drive you home and stay with you for 24 hours after surgery.   If you are taking public transportation, you will need to have a responsible individual with you.  Please call the [place] Dept. at [phone] if you have any questions about these instructions.  Surgery Visitation Policy:  Patients having surgery or a procedure may have two visitors.  Children under the  age of 16 must have an adult with them who is not the patient.  Preparing the Skin Before Surgery     To help prevent the risk of infection at your surgical site, we are now providing you with rinse-free Sage 2% Chlorhexidine Gluconate (CHG) disposable wipes.  Chlorhexidine Gluconate (CHG) Soap  o An antiseptic cleaner that kills germs and bonds with the skin to continue killing germs even after washing  o Used for showering the night before surgery and morning of surgery  The night before surgery: Shower or bathe with warm water. Do not apply perfume, lotions, powders. Wait one hour after shower. Skin should be dry and cool. Open Sage wipe package - use 6 disposable cloths. Wipe body using one cloth for the right arm, one cloth for the left arm, one cloth for the right leg, one cloth for the left leg, one cloth for the chest/abdomen area, and one cloth for the back. Do not use on open wounds or sores. Do not use on face or genitals (private parts). If you are breast feeding, do not use on breasts. 5. Do not rinse, allow to dry. 6. Skin may feel "tacky" for several minutes. 7. Dress in clean clothes. 8. Place clean sheets on your bed and do not sleep with pets.  REPEAT ABOVE ON THE MORNING OF SURGERY BEFORE ARRIVING TO [place].

## 2022-04-29 ENCOUNTER — Encounter: Payer: Self-pay | Admitting: Podiatry

## 2022-04-29 NOTE — Progress Notes (Signed)
Perioperative / Anesthesia Services  Pre-Admission Testing Clinical Review / Preoperative Anesthesia Consult  Date: 05/01/22  Patient Demographics:  Name: [name] DOB:   [birth year] MRN:   [id number]  Planned Surgical Procedure(s):    Case: [id number] Date/Time: 05/03/22 1045   Procedure: AMPUTATION TOE (Left: Toe)   Anesthesia type: Choice   Pre-op diagnosis: M86.172 - Osteomyelitis, ankle and foot, acute   Location: [facility] OR ROOM 05 / [place] [facility] FOR ANESTHESIA [facility]   Surgeons: Sharlotte Alamo, DPM     NOTE: Available PAT nursing documentation and vital signs have been reviewed. Clinical nursing staff has updated patient's PMH/PSHx, current medication list, and drug allergies/intolerances to ensure comprehensive history available to assist in medical decision making as it pertains to the aforementioned surgical procedure and anticipated anesthetic course. Extensive review of available clinical information personally performed. [place] PMH and PSHx updated with any diagnoses/procedures that  may have been inadvertently omitted during his intake with the pre-admission testing department's nursing staff.  Clinical Discussion:  [name] is a 89 y.o. male who is submitted for pre-surgical anesthesia review and clearance prior to him undergoing the above procedure. Patient is a Former Smoker (30 pack years; quit 01/1995). Pertinent PMH includes: CAD, atrial fibrillation, HFpEF, aortic stenosis, thoracic aortic ectasia, murmur, PAD, aortic atherosclerosis, RIGHT carotid artery stenosis (s/p PTA and stenting), cardiomegaly, RBBB, HTN, HLD, PVD, CKD-III, COPD (requires supplemental oxygen ATC), osteomyelitis of LEFT great toe.   Patient is followed by cardiology Ubaldo Glassing, MD). He was last seen in the cardiology clinic on 05/01/2022; notes reviewed. At the time of his clinic visit, patient doing well overall from a cardiovascular perspective.  Patient with chronic shortness of breath  related to his underlying COPD diagnosis.  Patient requires supplemental oxygen around-the-clock and is followed by pulmonary medicine.  Patient has intermittent episodes of palpitations that have been improved with the addition of prescribed interventions.  He has intermittent peripheral edema that is reported to be stable and at baseline.  Patient denied any chest pain, PND, orthopnea, weakness, fatigue, vertiginous symptoms, or presyncope/syncope. Patient with a past medical history significant for cardiovascular diagnoses. Documented physical exam was grossly benign, providing no evidence of acute exacerbation and/or decompensation of the patient's known cardiovascular conditions.  Most recent TTE was performed on 03/01/2022 revealing a normal left ventricular systolic function with an EF of 60 to 65%.  There were no regional wall motion abnormalities.  Mild LVH noted.  Diastolic Doppler parameters indeterminant.  Mildly reduced right ventricular systolic function noted.  Right ventricle moderately enlarged with a severely elevated PASP of 77.7 mmHg.  Left atrium moderately and right atrium severely dilated.  There was mild to moderate mitral valve, and moderate mitral valve, regurgitation.  There was moderate aortic valve calcification with mild associated regurgitation.  Moderate aortic valve stenosis noted with a mean pressure gradient of 25.5 mmHg; AVA (VTI) = 0.86 cm.  Patient underwent MRI imaging of the brain and MRA of the head and neck on 02/28/2022.  Study revealed severe stenosis of the RICA at the carotid bifurcation extending into the angiographic string sign.  Stenosis noted to be approximately 90%.  Patient subsequently underwent PTA and stenting of the RIGHT internal carotid artery on 03/18/2022; 9 mm proximal, 7 mm distal, and 4 cm long exact stent placed.  Patient with a history of PVD. He is status post RIGHT femoral endarterectomy on 08/09/2020.  Patient with an atrial fibrillation  diagnosis; CHA2DS2-VASc Score = 5 (age x 2,  CHF, HTN, vascular disease history). His rate and rhythm are currently being maintained on oral diltiazem + metoprolol tartrate. He is chronically anticoagulated using dose reduced apixaban; reported to be compliant with therapy with no evidence or reports of GI bleeding.  Blood pressure well controlled at 120/70 mmHg on currently prescribed CCB (diltiazem), diuretic (torsemide and metolazone) and beta-blocker (metoprolol tartrate therapies. He is on a atorvastatin for his HLD diagnosis and further ASCVD prevention.  Patient is not diabetic.  He does not have an OSAH diagnosis, however again requires supplemental oxygen ATC.  Functional capacity limited by age, supplemental oxygen use, and multiple medical comorbidities.  With that being said, patient not felt to be able to achieve 4 METS of physical activity without experiencing angina/anginal equivalent symptoms.  No changes were made to his medication regimen.  Patient follow-up with outpatient cardiology in 4 months or sooner if needed.  [name] is scheduled for an AMPUTATION LEFT GREAT TOE on 05/03/2022 with Dr. Sharlotte Alamo, DPM.  Given patient's past medical history significant for cardiovascular/cardiopulmonary diagnoses, presurgical clearances were sought from both cardiology and pulmonary medicine.   Per pulmonary medicine Raul Del, MD), "this patient is optimized for surgery and may proceed with the planned procedural course with an risk of significant perioperative cardiopulmonary complications".  Per cardiology Albertine Patricia, PA-C), "this patient is optimized for surgery and may proceed with the planned procedural course with a LOW risk of significant perioperative cardiovascular complications".   Again, this patient is on daily oral anticoagulation and antithrombotic therapies.  He has been instructed on recommendations from his cardiologist for holding his apixaban for 2 days prior to his  procedure with plans to restart since postoperative bleeding risk felt to be minimized by his primary attending surgeon.  The patient is aware that his last dose of apixaban should be on 04/30/2022.  Given his cardiovascular history, cardiology requesting that patient remain on his daily low-dose ASA throughout his perioperative course.  Patient reports previous perioperative complications with anesthesia in the past.  Patient reporting a history of (+) postoperative delirium manifested as visual hallucinations in the past.  In review of the available records, it is noted that patient underwent a general anesthetic course here at [facility] ([facility]) in 07/2020 without documented complications.      04/26/2022    2:23 PM 04/18/2022   10:20 AM 04/18/2022   10:11 AM  Vitals with BMI  Height 5\' 9"     Weight 142 lbs    BMI [id number]    Systolic  [id number]  Diastolic  64 66  Pulse  75 0    Providers/Specialists:   NOTE: Primary physician provider listed below. Patient may have been seen by APP or partner within same practice.   PROVIDER ROLE / SPECIALTY LAST Merry Proud, DPM Podiatry (Surgeon) 04/22/2022  Baxter Hire, MD Primary Care Provider 03/21/2022  Bartholome Bill, MD Cardiology 11/21/2021  Wallene Huh, MD Pulmonary Medicine 04/25/2022   Allergies:  Bee venom  Current Home Medications:   No current facility-administered medications for this encounter.    acetaminophen (TYLENOL) 500 MG tablet   albuterol (PROVENTIL) (2.5 MG/3ML) 0.083% nebulizer solution   alendronate (FOSAMAX) 70 MG tablet   amoxicillin-clavulanate (AUGMENTIN) 875-125 MG tablet   apixaban (ELIQUIS) 2.5 MG TABS tablet   ascorbic acid (VITAMIN C) 250 MG tablet   aspirin EC 81 MG EC tablet   atorvastatin (LIPITOR) 40 MG tablet   Bacillus Coagulans-Inulin (BENEFIBER PREBIOTIC+PROBIOTIC)  CHEW   Calcium Carb-Cholecalciferol (CALCIUM 600/VITAMIN D PO)   CARTIA XT 300 MG 24 hr  capsule   Fluticasone-Umeclidin-Vilant (TRELEGY ELLIPTA) 100-62.5-25 MCG/INH AEPB   magnesium oxide (MAG-OX) 400 (240 Mg) MG tablet   metolazone (ZAROXOLYN) 2.5 MG tablet   metoprolol tartrate (LOPRESSOR) 50 MG tablet   Multiple Vitamin (MULTIVITAMIN WITH MINERALS) TABS tablet   OXYGEN   potassium chloride (KLOR-CON) 10 MEQ tablet   povidone-iodine (BETADINE) 10 % external solution   predniSONE (DELTASONE) 5 MG tablet   senna-docusate (SENOKOT-S) 8.6-50 MG tablet   torsemide (DEMADEX) 20 MG tablet   traMADol (ULTRAM) 50 MG tablet   Vitamin D, Ergocalciferol, (DRISDOL) 1.25 MG (50000 UNIT) CAPS capsule   History:   Past Medical History:  Diagnosis Date   (HFpEF) heart failure with preserved ejection fraction    a.) TTE 01/06/2019: EF 55-60%, mild LVH, BAE, mild-mod MR, triv TR, PASP 50.3, G1DD; b.) TTE 10/10/2019: EF 55-60%, mod reduced RVSF, mild LAE, sev RAE, MAC, mod-sec TR. AoV sclerosis, G1DD; c.) TTE 03/01/2022: EF 60-65%, mild LVH, reduced RVSF, mod RVE, PASP 77.7, mod LAE, sev RAE, mild-mod MR, mod TR, mild AR   Aortic atherosclerosis    Aortic stenosis 03/01/2022   a.) TTE 03/01/2022: moderate AS (MPG 25.5); VTI = 0.86 cm   Aspiration pneumonia    Atrial fibrillation with RVR    a.) CHA2DS2VASc = 5 (age x2, CHF, HTN, vascular disease history);  b.) rate/rhythm maintained on oral diltiazem + metoprolol tartrate; chronically anticoagulated with apixaban   Basal cell carcinoma 09/13/2021   Left cheek. EDC   Cardiac murmur    a.) grade I/VI medium pitched mid systolic blowing at lower LSB   Cardiomegaly    Carotid stenosis, right    a.) MRA head/neck 02/28/2022: severe (>90%) RICA; b.) s/p PTA and stent placement 03/18/2022 --> 9 mm proximal, 7 mm distal, 4 cm long exact stent   Central retinal artery occlusion, left    Chronic kidney disease, stage 3a    Complication of anesthesia    a.) post-operative delirium/hallucinations   COPD (chronic obstructive pulmonary disease)     Coronary artery disease    Dependence on supplemental oxygen    a.) 4L/[place]   History of 2019 novel coronavirus disease (COVID-19) 01/04/2019   HLD (hyperlipidemia)    Hypertension    Long term current use of anticoagulant    a.) apixaban   Long term current use of systemic steroids    a.) prednisone   Osteomyelitis of great toe of left foot    PAD (peripheral artery disease)    a.) s/p RIGHT femoral endarterectomy on 08/09/2020   Pneumonia due to COVID-19 virus    PVD (peripheral vascular disease)    RBBB (right bundle branch block)    Sepsis    Skin cancer    nose and ear - tx with Mohs   Thoracic aortic ectasia    a.) measured 3.6 x 3.3 cm on 10/08/2019   Past Surgical History:  Procedure Laterality Date   CAROTID PTA/STENT INTERVENTION Right 03/18/2022   Procedure: CAROTID PTA/STENT INTERVENTION;  Surgeon: Algernon Huxley, MD;  Location: [place] [facility];  Service: Cardiovascular;  Laterality: Right;   CATARACT EXTRACTION, BILATERAL Bilateral 2005   COLONOSCOPY  2007   ENDARTERECTOMY FEMORAL Right 08/09/2020   Procedure: RIGHT COMMON AND PROFUNDA FEMORAL ENDARTERECTOMY;  Surgeon: Algernon Huxley, MD;  Location: [facility] ORS;  Service: Vascular;  Laterality: Right;   LOWER EXTREMITY ANGIOGRAPHY Right 07/05/2020  Procedure: LOWER EXTREMITY ANGIOGRAPHY;  Surgeon: Algernon Huxley, MD;  Location: [place] [facility];  Service: Cardiovascular;  Laterality: Right;   LOWER EXTREMITY ANGIOGRAPHY Left 04/18/2022   Procedure: Lower Extremity Angiography;  Surgeon: Algernon Huxley, MD;  Location: [place] [facility];  Service: Cardiovascular;  Laterality: Left;   Renal Bypass Surgery Bilateral 2000   No family history on file. Social History   Tobacco Use   Smoking status: Former    Packs/day: 1.00    Years: 30.00    Additional pack years: 0.00    Total pack years: 30.00    Types: Cigarettes    Quit date: 103    Years since quitting: 27.2   Smokeless tobacco: Never  Vaping Use    Vaping Use: Never used  Substance Use Topics   Alcohol use: Not Currently   Drug use: Never    Pertinent Clinical Results:  LABS:   Hospital Outpatient Visit on 04/30/2022  Component Date Value Ref Range Status   Sodium 04/30/2022 136  135 - 145 mmol/L Final   Potassium 04/30/2022 4.2  3.5 - 5.1 mmol/L Final   Chloride 04/30/2022 96 (L)  98 - 111 mmol/L Final   CO2 04/30/2022 30  22 - 32 mmol/L Final   Glucose, Bld 04/30/2022 95  70 - 99 mg/dL Final   Glucose reference range applies only to samples taken after fasting for at least 8 hours.   BUN 04/30/2022 27 (H)  8 - 23 mg/dL Final   Creatinine, Ser 04/30/2022 1.15  0.61 - 1.24 mg/dL Final   Calcium 04/30/2022 9.1  8.9 - 10.3 mg/dL Final   GFR, Estimated 04/30/2022 >60  >60 mL/min Final   Comment: (NOTE) Calculated using the CKD-EPI Creatinine Equation (2021)    Anion gap 04/30/2022 10  5 - 15 Final   Performed at [facility], [address] [place], [place] [address]   WBC 04/30/2022 11.9 (H)  4.0 - 10.5 K/uL Final   RBC 04/30/2022 3.09 (L)  4.22 - 5.81 MIL/uL Final   Hemoglobin 04/30/2022 9.3 (L)  13.0 - 17.0 g/dL Final   HCT 04/30/2022 30.3 (L)  39.0 - 52.0 % Final   MCV 04/30/2022 98.1  80.0 - 100.0 fL Final   MCH 04/30/2022 30.1  26.0 - 34.0 pg Final   MCHC 04/30/2022 30.7  30.0 - 36.0 g/dL Final   RDW 04/30/2022 15.0  11.5 - 15.5 % Final   Platelets 04/30/2022 353  150 - 400 K/uL Final   nRBC 04/30/2022 0.0  0.0 - 0.2 % Final   Performed at [facility], [address] [place], [place] [address]    ECG: Date: 05/01/2022 Time ECG obtained: 1050 AM Rate: 81 bpm Rhythm: atrial fibrillation; RBBB Axis (leads I and aVF): Normal Intervals: QRS 146 ms. QTc 478 ms. ST segment and T wave changes: No evidence of acute ST segment elevation or depression Comparison: Similar to previous tracing obtained on 03/27/2022   IMAGING / PROCEDURES: DIAGNOSTIC RADIOGRAPHS OF LEFT FOOT 3 VIEWS performed on  04/03/2022 Normal joint space pattern throughout the foot.   No clear evidence of any cortical erosions noted at the distal phalanx on the hallux.   Extensive arterial calcifications are noted throughout the foot and lower leg.   DIAGNOSTIC RADIOGRAPHS OF CHEST 2 VIEWS performed on 03/27/2022 Emphysematous changes are again seen. There is stable scarring in the lung apices.  There is no new focal lung infiltrate or pleural effusion.  There  is no pneumothorax.  The heart is enlarged, unchanged.  No acute fractures are seen.  There are chronic compression deformities of thoracic vertebral bodies similar to the prior study.  TRANSTHORACIC ECHOCARDIOGRAM performed on 03/01/2022 Left ventricular ejection fraction, by estimation, is 60 to 65%. The left ventricle has normal function. The left ventricle has no regional wall motion abnormalities. There is mild left ventricular hypertrophy. Left ventricular diastolic parameters are indeterminate.  Right ventricular systolic function is mildly reduced. The right ventricular size is moderately enlarged. There is severely elevated pulmonary artery systolic pressure. The estimated right ventricular systolic pressure is [id number] mmHg.  Left atrial size was moderately dilated.  Right atrial size was severely dilated.  The mitral valve is normal in structure. Mild to moderate mitral valve regurgitation. No evidence of mitral stenosis.  Tricuspid valve regurgitation is moderate.  The aortic valve is normal in structure. There is moderate calcification of the aortic valve. Aortic valve regurgitation is mild. Moderate aortic valve stenosis. Aortic valve area, by VTI measures 0.86 cm. Aortic valve mean gradient measures 25.5 mmHg. Aortic valve Vmax measures 3.64 m/s.  The inferior vena cava is normal in size with greater than 50% respiratory variability, suggesting right atrial pressure of 3 mmHg.   MRI BRAIN AND MR ANGIO HEAD AND NECK W WO CONTRAST performed on  03/01/2019 No acute intracranial abnormality. No emergent large vessel occlusion. Severe stenosis of the right internal carotid artery at the carotid bifurcation extending into the ICA with angiographic string sign. Stenosis is approximately 90%.  PULMONARY FUNCTION TESTING performed on 04/07/2019 FEV1 0.83 liters FVC 2.32 liters Ratio 36 Expiratory flow volume loop is delayed. Impression: severe-very severe COPD  Impression and Plan:  [name] has been referred for pre-anesthesia review and clearance prior to him undergoing the planned anesthetic and procedural courses. Available labs, pertinent testing, and imaging results were personally reviewed by me in preparation for upcoming operative/procedural course. [facility] medical record has been updated following extensive record review and patient interview with PAT staff.   This patient has been appropriately cleared by cardiology (LOW) and by pulmonary medicine (ACCEPTABLE) with the individually indicated risks of significant perioperative cardiovascular/cardiopulmonary complications. Based on clinical review performed today (05/01/22), barring any significant acute changes in the patient's overall condition, it is anticipated that he will be able to proceed with the planned surgical intervention. Any acute changes in clinical condition may necessitate his procedure being postponed and/or cancelled. Patient will meet with anesthesia team (MD and/or CRNA) on the day of his procedure for preoperative evaluation/assessment. Questions regarding anesthetic course will be fielded at that time.   Pre-surgical instructions were reviewed with the patient during his PAT appointment, and questions were fielded to satisfaction by PAT clinical staff. He has been instructed on which medications that he will need to hold prior to surgery, as well as the ones that have been deemed safe/appropriate to take of the day of his procedure. As part of the  general education provided by PAT, patient made aware both verbally and in writing, that he would need to abstain from the use of any illegal substances during his perioperative course.  He was advised that failure to follow the provided instructions could necessitate case cancellation or result serious perioperative complications up to and including death. Patient encouraged to contact PAT and/or his surgeon's office to discuss any questions or concerns that may arise prior to surgery; verbalized understanding.   Honor Loh, MSN, APRN, FNP-C, CEN [facility]  Peri-operative Services Nurse Practitioner Phone: [phone] Fax: [phone] 05/01/22 4:51 PM  NOTE: This note has been prepared using Dragon dictation software. Despite my best ability to proofread, there is always the potential that unintentional transcriptional errors may still occur from this process.

## 2022-04-30 ENCOUNTER — Encounter: Payer: Self-pay | Admitting: Urgent Care

## 2022-04-30 ENCOUNTER — Encounter
Admission: RE | Admit: 2022-04-30 | Discharge: 2022-04-30 | Disposition: A | Discharge status: Home / Self Care | Payer: Medicare Other | Source: Ambulatory Visit | Attending: Podiatry | Admitting: Podiatry

## 2022-04-30 ENCOUNTER — Other Ambulatory Visit: Payer: Self-pay | Admitting: Podiatry

## 2022-04-30 ENCOUNTER — Encounter: Payer: Self-pay | Admitting: Podiatry

## 2022-04-30 DIAGNOSIS — I10 Essential (primary) hypertension: Secondary | ICD-10-CM

## 2022-04-30 DIAGNOSIS — Z01812 Encounter for preprocedural laboratory examination: Secondary | ICD-10-CM

## 2022-04-30 DIAGNOSIS — I482 Chronic atrial fibrillation, unspecified: Secondary | ICD-10-CM

## 2022-04-30 DIAGNOSIS — N1831 Chronic kidney disease, stage 3a: Secondary | ICD-10-CM | POA: Diagnosis not present

## 2022-04-30 DIAGNOSIS — I130 Hypertensive heart and chronic kidney disease with heart failure and stage 1 through stage 4 chronic kidney disease, or unspecified chronic kidney disease: Secondary | ICD-10-CM | POA: Diagnosis not present

## 2022-04-30 DIAGNOSIS — I5032 Chronic diastolic (congestive) heart failure: Secondary | ICD-10-CM

## 2022-04-30 DIAGNOSIS — Z01818 Encounter for other preprocedural examination: Secondary | ICD-10-CM | POA: Diagnosis present

## 2022-04-30 HISTORY — DX: Unspecified right bundle-branch block: I45.10

## 2022-04-30 LAB — CBC
HCT: 30.3 % — ABNORMAL LOW (ref 39.0–52.0)
Hemoglobin: 9.3 g/dL — ABNORMAL LOW (ref 13.0–17.0)
MCH: 30.1 pg (ref 26.0–34.0)
MCHC: 30.7 g/dL (ref 30.0–36.0)
MCV: 98.1 fL (ref 80.0–100.0)
Platelets: 353 10*3/uL (ref 150–400)
RBC: 3.09 MIL/uL — ABNORMAL LOW (ref 4.22–5.81)
RDW: 15 % (ref 11.5–15.5)
WBC: 11.9 10*3/uL — ABNORMAL HIGH (ref 4.0–10.5)
nRBC: 0 % (ref 0.0–0.2)

## 2022-04-30 LAB — BASIC METABOLIC PANEL
Anion gap: 10 (ref 5–15)
BUN: 27 mg/dL — ABNORMAL HIGH (ref 8–23)
CO2: 30 mmol/L (ref 22–32)
Calcium: 9.1 mg/dL (ref 8.9–10.3)
Chloride: 96 mmol/L — ABNORMAL LOW (ref 98–111)
Creatinine, Ser: 1.15 mg/dL (ref 0.61–1.24)
GFR, Estimated: >60 mL/min (ref >60)
Glucose, Bld: 95 mg/dL (ref 70–99)
Potassium: 4.2 mmol/L (ref 3.5–5.1)
Sodium: 136 mmol/L (ref 135–145)

## 2022-05-03 ENCOUNTER — Encounter
Admission: RE | Disposition: A | Discharge status: Home / Self Care | Payer: Self-pay | Source: Home / Self Care | Attending: Podiatry

## 2022-05-03 ENCOUNTER — Ambulatory Visit
Admission: RE | Admit: 2022-05-03 | Discharge: 2022-05-03 | Disposition: A | Discharge status: Home / Self Care | Payer: Medicare Other | Attending: Podiatry | Admitting: Podiatry

## 2022-05-03 ENCOUNTER — Other Ambulatory Visit: Payer: Self-pay

## 2022-05-03 ENCOUNTER — Ambulatory Visit: Payer: Medicare Other | Admitting: Urgent Care

## 2022-05-03 ENCOUNTER — Encounter: Payer: Self-pay | Admitting: Podiatry

## 2022-05-03 DIAGNOSIS — I7025 Atherosclerosis of native arteries of other extremities with ulceration: Secondary | ICD-10-CM | POA: Diagnosis not present

## 2022-05-03 DIAGNOSIS — N1831 Chronic kidney disease, stage 3a: Secondary | ICD-10-CM

## 2022-05-03 DIAGNOSIS — M86172 Other acute osteomyelitis, left ankle and foot: Secondary | ICD-10-CM | POA: Insufficient documentation

## 2022-05-03 DIAGNOSIS — I96 Gangrene, not elsewhere classified: Secondary | ICD-10-CM | POA: Insufficient documentation

## 2022-05-03 DIAGNOSIS — I5032 Chronic diastolic (congestive) heart failure: Secondary | ICD-10-CM

## 2022-05-03 DIAGNOSIS — L97524 Non-pressure chronic ulcer of other part of left foot with necrosis of bone: Secondary | ICD-10-CM | POA: Diagnosis not present

## 2022-05-03 DIAGNOSIS — I10 Essential (primary) hypertension: Secondary | ICD-10-CM

## 2022-05-03 DIAGNOSIS — I482 Chronic atrial fibrillation, unspecified: Secondary | ICD-10-CM

## 2022-05-03 DIAGNOSIS — Z01812 Encounter for preprocedural laboratory examination: Secondary | ICD-10-CM

## 2022-05-03 HISTORY — DX: Long term (current) use of anticoagulants: Z79.01

## 2022-05-03 HISTORY — PX: AMPUTATION TOE: SHX6595

## 2022-05-03 HISTORY — DX: Long term (current) use of systemic steroids: Z79.52

## 2022-05-03 HISTORY — DX: Cardiomegaly: I51.7

## 2022-05-03 LAB — AEROBIC/ANAEROBIC CULTURE W GRAM STAIN (SURGICAL/DEEP WOUND)

## 2022-05-03 SURGERY — AMPUTATION, TOE
Anesthesia: Monitor Anesthesia Care | Site: Toe | Laterality: Left

## 2022-05-03 MED ORDER — ACETAMINOPHEN 500 MG PO TABS
1000.0000 mg | ORAL_TABLET | Freq: Once | ORAL | Status: AC
  Administered 2022-05-03: 1000 mg via ORAL

## 2022-05-03 MED ORDER — PROPOFOL 500 MG/50ML IV EMUL
INTRAVENOUS | Status: DC | PRN
  Administered 2022-05-03: 75 ug/kg/min via INTRAVENOUS

## 2022-05-03 MED ORDER — PHENYLEPHRINE 80 MCG/ML (10ML) SYRINGE FOR IV PUSH (FOR BLOOD PRESSURE SUPPORT)
PREFILLED_SYRINGE | INTRAVENOUS | Status: DC | PRN
  Administered 2022-05-03 (×2): 80 ug via INTRAVENOUS

## 2022-05-03 MED ORDER — LACTATED RINGERS IV SOLN: INTRAVENOUS | Status: DC

## 2022-05-03 MED ORDER — FENTANYL CITRATE (PF) 100 MCG/2ML IJ SOLN
INTRAMUSCULAR | Status: DC | PRN
  Administered 2022-05-03: 50 ug via INTRAVENOUS

## 2022-05-03 MED ORDER — PHENYLEPHRINE 80 MCG/ML (10ML) SYRINGE FOR IV PUSH (FOR BLOOD PRESSURE SUPPORT)
PREFILLED_SYRINGE | INTRAVENOUS | Status: AC
  Filled 2022-05-03: qty 10

## 2022-05-03 MED ORDER — BUPIVACAINE HCL (PF) 0.5 % IJ SOLN
INTRAMUSCULAR | Status: AC
  Filled 2022-05-03: qty 30

## 2022-05-03 MED ORDER — 0.9 % SODIUM CHLORIDE (POUR BTL) OPTIME
TOPICAL | Status: DC | PRN
  Administered 2022-05-03: 500 mL

## 2022-05-03 MED ORDER — CEFAZOLIN SODIUM-DEXTROSE 2-4 GM/100ML-% IV SOLN
INTRAVENOUS | Status: AC
  Filled 2022-05-03: qty 100

## 2022-05-03 MED ORDER — ACETAMINOPHEN 500 MG PO TABS
ORAL_TABLET | ORAL | Status: AC
  Filled 2022-05-03: qty 2

## 2022-05-03 MED ORDER — HYDROCODONE-ACETAMINOPHEN 5-325 MG PO TABS: 1.0000 | ORAL_TABLET | ORAL | 0 refills | Status: DC | PRN

## 2022-05-03 MED ORDER — PROPOFOL 10 MG/ML IV BOLUS
INTRAVENOUS | Status: AC
  Filled 2022-05-03: qty 40

## 2022-05-03 MED ORDER — FAMOTIDINE 20 MG PO TABS
20.0000 mg | ORAL_TABLET | Freq: Once | ORAL | Status: AC
  Administered 2022-05-03: 20 mg via ORAL

## 2022-05-03 MED ORDER — CHLORHEXIDINE GLUCONATE 0.12 % MT SOLN
15.0000 mL | Freq: Once | OROMUCOSAL | Status: AC
  Administered 2022-05-03: 15 mL via OROMUCOSAL

## 2022-05-03 MED ORDER — CEFAZOLIN SODIUM-DEXTROSE 2-4 GM/100ML-% IV SOLN
2.0000 g | INTRAVENOUS | Status: AC
  Administered 2022-05-03: 2 g via INTRAVENOUS

## 2022-05-03 MED ORDER — ORAL CARE MOUTH RINSE: 15.0000 mL | Freq: Once | OROMUCOSAL | Status: AC

## 2022-05-03 MED ORDER — IPRATROPIUM-ALBUTEROL 0.5-2.5 (3) MG/3ML IN SOLN
RESPIRATORY_TRACT | Status: AC
  Filled 2022-05-03: qty 3

## 2022-05-03 MED ORDER — IPRATROPIUM-ALBUTEROL 0.5-2.5 (3) MG/3ML IN SOLN
3.0000 mL | Freq: Once | RESPIRATORY_TRACT | Status: AC
  Administered 2022-05-03: 3 mL via RESPIRATORY_TRACT

## 2022-05-03 MED ORDER — BUPIVACAINE HCL 0.5 % IJ SOLN
INTRAMUSCULAR | Status: DC | PRN
  Administered 2022-05-03: 10 mL

## 2022-05-03 MED ORDER — FENTANYL CITRATE (PF) 100 MCG/2ML IJ SOLN
INTRAMUSCULAR | Status: AC
  Filled 2022-05-03: qty 2

## 2022-05-03 MED ORDER — CHLORHEXIDINE GLUCONATE 0.12 % MT SOLN
OROMUCOSAL | Status: AC
  Filled 2022-05-03: qty 15

## 2022-05-03 MED ORDER — FAMOTIDINE 20 MG PO TABS
ORAL_TABLET | ORAL | Status: AC
  Filled 2022-05-03: qty 1

## 2022-05-03 SURGICAL SUPPLY — 53 items
BLADE MED AGGRESSIVE (BLADE) ×2 IMPLANT
BLADE OSC/SAGITTAL MD 5.5X18 (BLADE) ×2 IMPLANT
BLADE OSC/SAGITTAL MD 9X18.5 (BLADE) IMPLANT
BLADE SURG 15 STRL LF DISP TIS (BLADE) ×4 IMPLANT
BLADE SURG 15 STRL SS (BLADE) ×2
BLADE SURG MINI STRL (BLADE) ×2 IMPLANT
BNDG CMPR 75X21 PLY HI ABS (MISCELLANEOUS) ×1
BNDG ELASTIC 4X5.8 VLCR STR LF (GAUZE/BANDAGES/DRESSINGS) ×2 IMPLANT
BNDG ESMARCH 4 X 12 STRL LF (GAUZE/BANDAGES/DRESSINGS) ×1
BNDG ESMARCH 4X12 STRL LF (GAUZE/BANDAGES/DRESSINGS) ×2 IMPLANT
BNDG GAUZE DERMACEA FLUFF 4 (GAUZE/BANDAGES/DRESSINGS) ×2 IMPLANT
BNDG GZE 12X3 1 PLY HI ABS (GAUZE/BANDAGES/DRESSINGS) ×1
BNDG GZE DERMACEA 4 6PLY (GAUZE/BANDAGES/DRESSINGS) ×1
BNDG STRETCH GAUZE 3IN X12FT (GAUZE/BANDAGES/DRESSINGS) ×2 IMPLANT
CUFF TOURN SGL QUICK 12 (TOURNIQUET CUFF) ×2 IMPLANT
CUFF TOURN SGL QUICK 18X4 (TOURNIQUET CUFF) ×2 IMPLANT
DRAPE FLUOR MINI C-ARM 54X84 (DRAPES) ×2 IMPLANT
DURAPREP 26ML APPLICATOR (WOUND CARE) ×2 IMPLANT
ELECT REM PT RETURN 9FT ADLT (ELECTROSURGICAL) ×1
ELECTRODE REM PT RTRN 9FT ADLT (ELECTROSURGICAL) ×2 IMPLANT
GAUZE SPONGE 4X4 12PLY STRL (GAUZE/BANDAGES/DRESSINGS) ×2 IMPLANT
GAUZE STRETCH 2X75IN STRL (MISCELLANEOUS) ×2 IMPLANT
GAUZE XEROFORM 1X8 LF (GAUZE/BANDAGES/DRESSINGS) ×2 IMPLANT
GLOVE BIO SURGEON STRL SZ7.5 (GLOVE) ×2 IMPLANT
GLOVE INDICATOR 8.0 STRL GRN (GLOVE) ×2 IMPLANT
GOWN STRL REUS W/ TWL LRG LVL3 (GOWN DISPOSABLE) ×4 IMPLANT
GOWN STRL REUS W/TWL LRG LVL3 (GOWN DISPOSABLE) ×2
HANDPIECE VERSAJET DEBRIDEMENT (MISCELLANEOUS) ×2 IMPLANT
IV NS IRRIG 3000ML ARTHROMATIC (IV SOLUTION) ×2 IMPLANT
KIT TURNOVER KIT A (KITS) ×2 IMPLANT
LABEL OR SOLS (LABEL) ×2 IMPLANT
MANIFOLD NEPTUNE II (INSTRUMENTS) ×2 IMPLANT
NDL FILTER BLUNT 18X1 1/2 (NEEDLE) ×2 IMPLANT
NDL HYPO 25X1 1.5 SAFETY (NEEDLE) ×4 IMPLANT
NEEDLE FILTER BLUNT 18X1 1/2 (NEEDLE) ×1 IMPLANT
NEEDLE HYPO 25X1 1.5 SAFETY (NEEDLE) ×2 IMPLANT
NS IRRIG 500ML POUR BTL (IV SOLUTION) ×2 IMPLANT
PACK EXTREMITY ARMC (MISCELLANEOUS) ×2 IMPLANT
PAD ABD DERMACEA PRESS 5X9 (GAUZE/BANDAGES/DRESSINGS) ×4 IMPLANT
SOL PREP PVP 2OZ (MISCELLANEOUS) ×1
SOLUTION PREP PVP 2OZ (MISCELLANEOUS) ×2 IMPLANT
STOCKINETTE BIAS CUT 4 980044 (GAUZE/BANDAGES/DRESSINGS) ×2 IMPLANT
STOCKINETTE STRL 6IN 960660 (GAUZE/BANDAGES/DRESSINGS) ×2 IMPLANT
STRIP CLOSURE SKIN 1/4X4 (GAUZE/BANDAGES/DRESSINGS) ×2 IMPLANT
SUT ETHILON 3-0 FS-10 30 BLK (SUTURE) ×1
SUT ETHILON 4-0 (SUTURE)
SUT ETHILON 4-0 FS2 18XMFL BLK (SUTURE)
SUTURE EHLN 3-0 FS-10 30 BLK (SUTURE) ×2 IMPLANT
SUTURE ETHLN 4-0 FS2 18XMF BLK (SUTURE) IMPLANT
SWAB CULTURE AMIES ANAERIB BLU (MISCELLANEOUS) ×2 IMPLANT
SYR 10ML LL (SYRINGE) ×2 IMPLANT
TRAP FLUID SMOKE EVACUATOR (MISCELLANEOUS) ×2 IMPLANT
WATER STERILE IRR 500ML POUR (IV SOLUTION) ×2 IMPLANT

## 2022-05-03 NOTE — Discharge Instructions (Addendum)
AMBULATORY SURGERY  DISCHARGE INSTRUCTIONS   The drugs that you were given will stay in your system until tomorrow so for the next 24 hours you should not:  Drive an automobile Make any legal decisions Drink any alcoholic beverage   You may resume regular meals tomorrow.  Today it is better to start with liquids and gradually work up to solid foods.  You may eat anything you prefer, but it is better to start with liquids, then soup and crackers, and gradually work up to solid foods.   Please notify your doctor immediately if you have any unusual bleeding, trouble breathing, redness and pain at the surgery site, drainage, fever, or pain not relieved by medication.    Additional Instructions:  Elevate the operative extremity on two pillows at all times.   Do not remove surgical dressing. Reinforce surgical dressing PRN with Kerlix.   Restart Blood Thinners today 05/02/2021  Elevate the operative extremity on two pillows at all times.   Wear post op shoe when up and ambulating  Weight bearing as tolerated with pressure on the heel and post op shoe on        Please contact your physician with any problems or Same Day Surgery at [phone], Monday through Friday 6 am to 4 pm, or [place] at [facility] number at [phone].

## 2022-05-03 NOTE — Anesthesia Preprocedure Evaluation (Addendum)
Anesthesia Evaluation  Patient identified by MRN, date of birth, ID band Patient awake    Reviewed: Allergy & Precautions, H&P , NPO status , [name] Chart, lab work & pertinent test results  History of Anesthesia Complications (+) history of anesthetic complications (postoperative delirium manifested as visual hallucinations)  Airway Mallampati: II  TM Distance: >3 FB Neck ROM: full    Dental  (+) Edentulous Upper, Edentulous Lower   Pulmonary shortness of breath and at rest, COPD,  COPD inhaler and oxygen dependent, former smoker Pt uses Fi02 3% at home  2L [place]  - rhonchi (-) wheezing      Cardiovascular hypertension, Pt. on home beta blockers pulmonary hypertension+ CAD, + Peripheral Vascular Disease and +CHF  + dysrhythmias Atrial Fibrillation  Rhythm:Regular Rate:Normal + Peripheral Edema- Systolic murmurs  Most recent TTE was performed on 03/01/2022 revealing a normal left ventricular systolic function with an EF of 60 to 65%.  There were no regional wall motion abnormalities.  Mild LVH noted.  Diastolic Doppler parameters indeterminant.  Mildly reduced right ventricular systolic function noted.  Right ventricle moderately enlarged with a severely elevated PASP of 77.7 mmHg.  Left atrium moderately and right atrium severely dilated.  There was mild to moderate mitral valve, and moderate mitral valve, regurgitation.  There was moderate aortic valve calcification with mild associated regurgitation.  Moderate aortic valve stenosis noted with a mean pressure gradient of 25.5 mmHg; AVA (VTI) = 0.86 cm.    Patient underwent MRI imaging of the brain and MRA of the head and neck on 02/28/2022.  Study revealed severe stenosis of the RICA at the carotid bifurcation extending into the angiographic string sign.  Stenosis noted to be approximately 90%.  Patient subsequently underwent PTA and stenting of the RIGHT internal carotid artery on  03/18/2022; 9 mm proximal, 7 mm distal, and 4 cm long exact stent placed.    Patient with a history of PVD. He is status post RIGHT femoral endarterectomy on 08/09/2020    Neuro/Psych negative neurological ROS  negative psych ROS   GI/Hepatic negative GI ROS, Neg liver ROS,,,  Endo/Other  negative endocrine ROS    Renal/GU Renal InsufficiencyRenal disease     Musculoskeletal   Abdominal Normal abdominal exam  (+)   Peds  Hematology  (+) Blood dyscrasia, anemia   Anesthesia Other Findings Pre-Anesthesia Care Team Note Reviewed  Cardiology Note Reviewed: 89 y.o. male with  Paroxysmal atrial fibrillation (CMS/HHS-HCC) Yes  Peripheral vascular disease (CMS-HCC)  Chronic heart failure with preserved ejection fraction (HFpEF) (CMS/HHS-HCC)  Coronary artery disease of native artery of native heart with stable angina pectoris (CMS-HCC)  Secondary hypertension  Mixed hyperlipidemia  Chronic atrial fibrillation (CMS/HHS-HCC)   Plan   1. Chronic atrial fibrillation: With controlled ventricular rate of 81 bpm by EKG -Continue diltiazem, metoprolol with no changes -Continue Eliquis for anticoagulation  2. Peripheral vascular disease: With history of recent carotid stent 02/2022. -Continue aspirin, Eliquis, metoprolol  3. Hyperlipidemia:  -Continue atorvastatin 20 mg daily  4. Chronic HFpEF: Symptomatically stable at this time -Continue routine follow-up with heart failure clinic -Continue current medical management including metoprolol, torsemide and as needed metolazone  5. Hypertension: Stable today 120/70 -Continue diltiazem, metoprolol with no changes  No orders of the defined types were placed in this encounter.  Return in about 4 months (around 08/31/2022)    Past Medical History: No date: (HFpEF) heart failure with preserved ejection fraction     Comment:  a.) TTE 01/06/2019: EF 55-60%,  mild LVH, BAE, mild-mod               MR, triv TR, PASP 50.3, G1DD; b.) TTE  10/10/2019: EF               55-60%, mod reduced RVSF, mild LAE, sev RAE, MAC, mod-sec              TR. AoV sclerosis, G1DD; c.) TTE 03/01/2022: EF 60-65%,               mild LVH, reduced RVSF, mod RVE, PASP 77.7, mod LAE, sev               RAE, mild-mod MR, mod TR, mild AR No date: Aortic atherosclerosis 03/01/2022: Aortic stenosis     Comment:  a.) TTE 03/01/2022: moderate AS (MPG 25.5); VTI = 0.86               cm No date: Aspiration pneumonia No date: Atrial fibrillation with RVR     Comment:  a.) CHA2DS2VASc = 5 (age x2, CHF, HTN, vascular disease               history);  b.) rate/rhythm maintained on oral diltiazem +              metoprolol tartrate; chronically anticoagulated with               apixaban 09/13/2021: Basal cell carcinoma     Comment:  Left cheek. EDC No date: Cardiac murmur     Comment:  a.) grade I/VI medium pitched mid systolic blowing at               lower LSB No date: Cardiomegaly No date: Carotid stenosis, right     Comment:  a.) MRA head/neck 02/28/2022: severe (>90%) RICA; b.)               s/p PTA and stent placement 03/18/2022 --> 9 mm proximal,              7 mm distal, 4 cm long exact stent No date: Central retinal artery occlusion, left No date: Chronic kidney disease, stage 3a No date: Complication of anesthesia     Comment:  a.) post-operative delirium/hallucinations No date: COPD (chronic obstructive pulmonary disease) No date: Coronary artery disease No date: Dependence on supplemental oxygen     Comment:  a.) 4L/[place] 01/04/2019: History of 2019 novel coronavirus disease (COVID-19) No date: HLD (hyperlipidemia) No date: Hypertension No date: Long term current use of anticoagulant     Comment:  a.) apixaban No date: Long term current use of systemic steroids     Comment:  a.) prednisone No date: Osteomyelitis of great toe of left foot No date: PAD (peripheral artery disease)     Comment:  a.) s/p RIGHT femoral endarterectomy on  08/09/2020 No date: Pneumonia due to COVID-19 virus No date: PVD (peripheral vascular disease) No date: RBBB (right bundle branch block) No date: Sepsis No date: Skin cancer     Comment:  nose and ear - tx with Mohs No date: Thoracic aortic ectasia     Comment:  a.) measured 3.6 x 3.3 cm on 10/08/2019  Past Surgical History: 03/18/2022: CAROTID PTA/STENT INTERVENTION; Right     Comment:  Procedure: CAROTID PTA/STENT INTERVENTION;  Surgeon:               Annice Needyew, Jason S, MD;  Location: [facility];  Service: Cardiovascular;  Laterality: Right; 2005: CATARACT EXTRACTION, BILATERAL; Bilateral 2007: COLONOSCOPY 08/09/2020: ENDARTERECTOMY FEMORAL; Right     Comment:  Procedure: RIGHT COMMON AND PROFUNDA FEMORAL               ENDARTERECTOMY;  Surgeon: Annice Needy, MD;  Location:               [facility];  Service: Vascular;  Laterality: Right; 07/05/2020: LOWER EXTREMITY ANGIOGRAPHY; Right     Comment:  Procedure: LOWER EXTREMITY ANGIOGRAPHY;  Surgeon: Annice Needy, MD;  Location: [facility];  Service:               Cardiovascular;  Laterality: Right; 04/18/2022: LOWER EXTREMITY ANGIOGRAPHY; Left     Comment:  Procedure: Lower Extremity Angiography;  Surgeon: Annice Needy, MD;  Location: [facility];  Service:               Cardiovascular;  Laterality: Left; 2000: Renal Bypass Surgery; Bilateral     Reproductive/Obstetrics negative OB ROS                             Anesthesia Physical Anesthesia Plan  ASA: 4  Anesthesia Plan: MAC and Regional   Post-op Pain Management:    Induction:   PONV Risk Score and Plan:   Airway Management Planned: Natural Airway  Additional Equipment:   Intra-op Plan:   Post-operative Plan:   Informed Consent: I have reviewed the patients History and Physical, chart, labs and discussed the procedure including the risks, benefits and alternatives for  the proposed anesthesia with the patient or authorized representative who has indicated his/her understanding and acceptance.     Dental advisory given  Plan Discussed with: Anesthesiologist, CRNA and Surgeon  Anesthesia Plan Comments: (Back-up GA discussed. DNR status discussed. Pt wishes to avoid chest compressions but allows other forms of resuscitation. )        Anesthesia Quick Evaluation

## 2022-05-03 NOTE — Anesthesia Postprocedure Evaluation (Signed)
Anesthesia Post Note  Patient: AIZEN MALO  Procedure(s) Performed: AMPUTATION TOE (Left: Toe)  Patient location during evaluation: PACU Anesthesia Type: Regional Level of consciousness: awake and alert Pain management: pain level controlled Vital Signs Assessment: post-procedure vital signs reviewed and stable Respiratory status: spontaneous breathing, nonlabored ventilation, respiratory function stable and patient connected to nasal cannula oxygen Cardiovascular status: blood pressure returned to baseline and stable Postop Assessment: no apparent nausea or vomiting Anesthetic complications: no   No notable events documented.   Last Vitals:  Vitals:   05/03/22 1245 05/03/22 1319  BP: (!) 161/80 (!) 127/50  Pulse: 60 79  Resp: 13 15  Temp: 36.6 C 36.5 C  SpO2: 98% 97%    Last Pain:  Vitals:   05/03/22 1319  TempSrc: Temporal  PainSc: 0-No pain                 Foye Deer

## 2022-05-03 NOTE — H&P (Signed)
Subjective: Patient presents today for amputation of his left great toe.  Objective: Pedal pulses could not clearly be palpated.  The skin is warm dry and atrophic with some edema and erythema in the left foot.  Full-thickness ulcerative area with exposed bone at the distal phalanx of the left hallux with distal gangrenous changes.  Assessment: 1.  Osteomyelitis with gangrenous changes left great toe. 2.  Severe peripheral vascular disease.  Plan: Medical history and physical in the chart reviewed.  Patient stable for surgery for amputation of his left great toe.

## 2022-05-03 NOTE — Interval H&P Note (Signed)
History and Physical Interval Note:  05/03/2022 10:52 AM  [name]  has presented today for surgery, with the diagnosis of M86.172 - Osteomyelitis, ankle and foot, acute.  The various methods of treatment have been discussed with the patient and family. After consideration of risks, benefits and other options for treatment, the patient has consented to  Procedure(s): AMPUTATION TOE (Left) as a surgical intervention.  The patient's history has been reviewed, patient examined, no change in status, stable for surgery.  I have reviewed the patient's chart and labs.  Questions were answered to the patient's satisfaction.     Ricci Barker

## 2022-05-03 NOTE — Op Note (Signed)
Date of operation: 05/03/2022.  Surgeon: Ricci Barker D.P.M.  Preoperative diagnosis: Osteomyelitis with gangrene left hallux.  Postoperative diagnosis: Same.  Procedure: Amputation left great toe.  Anesthesia: Local MAC.  Hemostasis: None.  Estimated blood loss: Less than 5 cc.  Cultures: Bone culture distal phalanx left hallux.  Pathology: Distal left hallux.  Complications: None, other than minimal bleeding.  Operative indications: This is a 89 year old male with history of severe peripheral vascular disease with recent development of full-thickness ulceration on the left great toe with exposed bone and progressive gangrenous changes.  After discussion with vascular surgery decision made to attempt amputation of the left great toe with the understanding that if this fails he will most likely need a more proximal amputation due to his circulatory status.  Operative procedure: Patient was taken to the operating room and placed on the table in the supine position.  Following satisfactory sedation the left first ray area was anesthetized with 10 cc of 0.5% Marcaine plain.  The foot was then prepped and draped in the usual sterile fashion.  Attention was directed towards the distal aspect of the left hallux where a fishmouth incision was made from the medial to lateral around the dorsal and plantar aspect of the great toe.  Incision carried sharply down to bone with periosteal dissection carried back proximally along the proximal phalanx.  Proximal phalanx was incised using a sagittal saw and the distal aspect of the toe was removed in toto.  Sample of bone from the distal phalanx taken for culture and the toe was passed off for pathology.  The wound was flushed with copious amounts of sterile saline and closed using 3-0 nylon vertical mattress and simple interrupted sutures.  Xeroform 4 x 4's and conformer applied to the left forefoot followed by Kerlix and an Ace.  Patient was awakened and  transported to the PACU having tolerated the anesthesia and procedure well.

## 2022-05-03 NOTE — Transfer of Care (Signed)
Immediate Anesthesia Transfer of Care Note  Patient: [name]  Procedure(s) Performed: AMPUTATION TOE (Left: Toe)  Patient Location: PACU  Anesthesia Type:General  Level of Consciousness: awake, alert , and oriented  Airway & Oxygen Therapy: Patient Spontanous Breathing and Patient connected to nasal cannula oxygen  Post-op Assessment: Report given to RN and Post -op Vital signs reviewed and stable  Post vital signs: Reviewed and stable  Last Vitals:  Vitals Value Taken Time  BP 119/51 05/03/22 1153  Temp    Pulse 82 05/03/22 1156  Resp 16 05/03/22 1156  SpO2 97 % 05/03/22 1156  Vitals shown include unvalidated device data.  Last Pain:  Vitals:   05/03/22 0935  TempSrc: Temporal  PainSc: 0-No pain         Complications: No notable events documented.

## 2022-05-05 LAB — AEROBIC/ANAEROBIC CULTURE W GRAM STAIN (SURGICAL/DEEP WOUND)

## 2022-05-07 LAB — SURGICAL PATHOLOGY

## 2022-05-08 LAB — AEROBIC/ANAEROBIC CULTURE W GRAM STAIN (SURGICAL/DEEP WOUND)

## 2022-05-09 LAB — AEROBIC/ANAEROBIC CULTURE W GRAM STAIN (SURGICAL/DEEP WOUND)

## 2022-05-10 ENCOUNTER — Encounter (INDEPENDENT_AMBULATORY_CARE_PROVIDER_SITE_OTHER): Payer: Medicare Other

## 2022-05-10 ENCOUNTER — Encounter: Payer: Self-pay | Admitting: Internal Medicine

## 2022-05-10 ENCOUNTER — Ambulatory Visit (INDEPENDENT_AMBULATORY_CARE_PROVIDER_SITE_OTHER): Payer: Medicare Other | Admitting: Vascular Surgery

## 2022-05-10 ENCOUNTER — Emergency Department: Payer: Medicare Other

## 2022-05-10 ENCOUNTER — Inpatient Hospital Stay
Admission: EM | Admit: 2022-05-10 | Discharge: 2022-05-21 | DRG: 193 | Disposition: A | Discharge status: Hospice | Payer: Medicare Other | Attending: Internal Medicine | Admitting: Internal Medicine

## 2022-05-10 ENCOUNTER — Other Ambulatory Visit: Payer: Self-pay

## 2022-05-10 DIAGNOSIS — Z7901 Long term (current) use of anticoagulants: Secondary | ICD-10-CM

## 2022-05-10 DIAGNOSIS — Z66 Do not resuscitate: Secondary | ICD-10-CM | POA: Diagnosis present

## 2022-05-10 DIAGNOSIS — N1831 Chronic kidney disease, stage 3a: Secondary | ICD-10-CM | POA: Diagnosis present

## 2022-05-10 DIAGNOSIS — Z9981 Dependence on supplemental oxygen: Secondary | ICD-10-CM

## 2022-05-10 DIAGNOSIS — I2489 Other forms of acute ischemic heart disease: Secondary | ICD-10-CM | POA: Diagnosis present

## 2022-05-10 DIAGNOSIS — Z8616 Personal history of COVID-19: Secondary | ICD-10-CM

## 2022-05-10 DIAGNOSIS — J449 Chronic obstructive pulmonary disease, unspecified: Secondary | ICD-10-CM | POA: Diagnosis present

## 2022-05-10 DIAGNOSIS — Z89422 Acquired absence of other left toe(s): Secondary | ICD-10-CM

## 2022-05-10 DIAGNOSIS — I480 Paroxysmal atrial fibrillation: Secondary | ICD-10-CM | POA: Diagnosis present

## 2022-05-10 DIAGNOSIS — Z515 Encounter for palliative care: Secondary | ICD-10-CM

## 2022-05-10 DIAGNOSIS — I96 Gangrene, not elsewhere classified: Secondary | ICD-10-CM | POA: Diagnosis present

## 2022-05-10 DIAGNOSIS — T8149XA Infection following a procedure, other surgical site, initial encounter: Secondary | ICD-10-CM | POA: Diagnosis present

## 2022-05-10 DIAGNOSIS — Z89412 Acquired absence of left great toe: Secondary | ICD-10-CM

## 2022-05-10 DIAGNOSIS — Z91030 Bee allergy status: Secondary | ICD-10-CM

## 2022-05-10 DIAGNOSIS — J9611 Chronic respiratory failure with hypoxia: Secondary | ICD-10-CM | POA: Diagnosis present

## 2022-05-10 DIAGNOSIS — J9621 Acute and chronic respiratory failure with hypoxia: Secondary | ICD-10-CM | POA: Diagnosis not present

## 2022-05-10 DIAGNOSIS — R531 Weakness: Secondary | ICD-10-CM

## 2022-05-10 DIAGNOSIS — I2510 Atherosclerotic heart disease of native coronary artery without angina pectoris: Secondary | ICD-10-CM | POA: Diagnosis present

## 2022-05-10 DIAGNOSIS — I4891 Unspecified atrial fibrillation: Secondary | ICD-10-CM | POA: Diagnosis not present

## 2022-05-10 DIAGNOSIS — J189 Pneumonia, unspecified organism: Secondary | ICD-10-CM | POA: Diagnosis not present

## 2022-05-10 DIAGNOSIS — Y95 Nosocomial condition: Secondary | ICD-10-CM

## 2022-05-10 DIAGNOSIS — I739 Peripheral vascular disease, unspecified: Secondary | ICD-10-CM | POA: Diagnosis present

## 2022-05-10 DIAGNOSIS — Z7983 Long term (current) use of bisphosphonates: Secondary | ICD-10-CM

## 2022-05-10 DIAGNOSIS — I5033 Acute on chronic diastolic (congestive) heart failure: Secondary | ICD-10-CM | POA: Diagnosis present

## 2022-05-10 DIAGNOSIS — E785 Hyperlipidemia, unspecified: Secondary | ICD-10-CM | POA: Diagnosis present

## 2022-05-10 DIAGNOSIS — Z7952 Long term (current) use of systemic steroids: Secondary | ICD-10-CM

## 2022-05-10 DIAGNOSIS — Y929 Unspecified place or not applicable: Secondary | ICD-10-CM

## 2022-05-10 DIAGNOSIS — I130 Hypertensive heart and chronic kidney disease with heart failure and stage 1 through stage 4 chronic kidney disease, or unspecified chronic kidney disease: Secondary | ICD-10-CM | POA: Diagnosis present

## 2022-05-10 DIAGNOSIS — B370 Candidal stomatitis: Secondary | ICD-10-CM | POA: Diagnosis present

## 2022-05-10 DIAGNOSIS — R7989 Other specified abnormal findings of blood chemistry: Secondary | ICD-10-CM | POA: Diagnosis present

## 2022-05-10 DIAGNOSIS — E876 Hypokalemia: Secondary | ICD-10-CM | POA: Diagnosis present

## 2022-05-10 DIAGNOSIS — J440 Chronic obstructive pulmonary disease with acute lower respiratory infection: Secondary | ICD-10-CM | POA: Diagnosis present

## 2022-05-10 DIAGNOSIS — Y838 Other surgical procedures as the cause of abnormal reaction of the patient, or of later complication, without mention of misadventure at the time of the procedure: Secondary | ICD-10-CM | POA: Diagnosis present

## 2022-05-10 DIAGNOSIS — D638 Anemia in other chronic diseases classified elsewhere: Secondary | ICD-10-CM | POA: Diagnosis present

## 2022-05-10 DIAGNOSIS — Z9842 Cataract extraction status, left eye: Secondary | ICD-10-CM

## 2022-05-10 DIAGNOSIS — K5900 Constipation, unspecified: Secondary | ICD-10-CM | POA: Diagnosis not present

## 2022-05-10 DIAGNOSIS — I700 Atherosclerosis of aorta: Secondary | ICD-10-CM | POA: Diagnosis present

## 2022-05-10 DIAGNOSIS — B965 Pseudomonas (aeruginosa) (mallei) (pseudomallei) as the cause of diseases classified elsewhere: Secondary | ICD-10-CM | POA: Diagnosis present

## 2022-05-10 DIAGNOSIS — Z87891 Personal history of nicotine dependence: Secondary | ICD-10-CM

## 2022-05-10 DIAGNOSIS — N179 Acute kidney failure, unspecified: Secondary | ICD-10-CM | POA: Diagnosis not present

## 2022-05-10 DIAGNOSIS — Z85828 Personal history of other malignant neoplasm of skin: Secondary | ICD-10-CM

## 2022-05-10 DIAGNOSIS — I5032 Chronic diastolic (congestive) heart failure: Secondary | ICD-10-CM | POA: Diagnosis present

## 2022-05-10 DIAGNOSIS — Z79899 Other long term (current) drug therapy: Secondary | ICD-10-CM

## 2022-05-10 DIAGNOSIS — R54 Age-related physical debility: Secondary | ICD-10-CM | POA: Diagnosis present

## 2022-05-10 DIAGNOSIS — Z9841 Cataract extraction status, right eye: Secondary | ICD-10-CM

## 2022-05-10 DIAGNOSIS — Z7982 Long term (current) use of aspirin: Secondary | ICD-10-CM

## 2022-05-10 DIAGNOSIS — K5903 Drug induced constipation: Secondary | ICD-10-CM

## 2022-05-10 DIAGNOSIS — I5020 Unspecified systolic (congestive) heart failure: Secondary | ICD-10-CM

## 2022-05-10 LAB — URINALYSIS, ROUTINE W REFLEX MICROSCOPIC
Bacteria, UA: NONE SEEN
Bilirubin Urine: NEGATIVE
Glucose, UA: NEGATIVE mg/dL
Ketones, ur: NEGATIVE mg/dL
Nitrite: NEGATIVE
Protein, ur: NEGATIVE mg/dL
Specific Gravity, Urine: 1.005 (ref 1.005–1.030)
WBC, UA: >50 WBC/hpf (ref 0–5)
pH: 6 (ref 5.0–8.0)

## 2022-05-10 LAB — COMPREHENSIVE METABOLIC PANEL
ALT: 15 U/L (ref 0–44)
AST: 38 U/L (ref 15–41)
Albumin: 2.9 g/dL — ABNORMAL LOW (ref 3.5–5.0)
Alkaline Phosphatase: 66 U/L (ref 38–126)
Anion gap: 13 (ref 5–15)
BUN: 22 mg/dL (ref 8–23)
CO2: 30 mmol/L (ref 22–32)
Calcium: 8.5 mg/dL — ABNORMAL LOW (ref 8.9–10.3)
Chloride: 96 mmol/L — ABNORMAL LOW (ref 98–111)
Creatinine, Ser: 1.02 mg/dL (ref 0.61–1.24)
GFR, Estimated: >60 mL/min (ref >60)
Glucose, Bld: 99 mg/dL (ref 70–99)
Potassium: 2.7 mmol/L — CL (ref 3.5–5.1)
Sodium: 139 mmol/L (ref 135–145)
Total Bilirubin: 0.6 mg/dL (ref 0.3–1.2)
Total Protein: 6.8 g/dL (ref 6.5–8.1)

## 2022-05-10 LAB — CBC WITH DIFFERENTIAL/PLATELET
Abs Immature Granulocytes: 0.07 10*3/uL (ref 0.00–0.07)
Basophils Absolute: 0 10*3/uL (ref 0.0–0.1)
Basophils Relative: 0 %
Eosinophils Absolute: 0 10*3/uL (ref 0.0–0.5)
Eosinophils Relative: 0 %
HCT: 27.7 % — ABNORMAL LOW (ref 39.0–52.0)
Hemoglobin: 8.8 g/dL — ABNORMAL LOW (ref 13.0–17.0)
Immature Granulocytes: 1 %
Lymphocytes Relative: 10 %
Lymphs Abs: 1 10*3/uL (ref 0.7–4.0)
MCH: 29.8 pg (ref 26.0–34.0)
MCHC: 31.8 g/dL (ref 30.0–36.0)
MCV: 93.9 fL (ref 80.0–100.0)
Monocytes Absolute: 1.3 10*3/uL — ABNORMAL HIGH (ref 0.1–1.0)
Monocytes Relative: 13 %
Neutro Abs: 8.1 10*3/uL — ABNORMAL HIGH (ref 1.7–7.7)
Neutrophils Relative %: 76 %
Platelets: 295 10*3/uL (ref 150–400)
RBC: 2.95 MIL/uL — ABNORMAL LOW (ref 4.22–5.81)
RDW: 14.9 % (ref 11.5–15.5)
WBC: 10.5 10*3/uL (ref 4.0–10.5)
nRBC: 0 % (ref 0.0–0.2)

## 2022-05-10 LAB — BRAIN NATRIURETIC PEPTIDE: B Natriuretic Peptide: 997.2 pg/mL — ABNORMAL HIGH (ref 0.0–100.0)

## 2022-05-10 LAB — MAGNESIUM: Magnesium: 1.7 mg/dL (ref 1.7–2.4)

## 2022-05-10 LAB — LACTIC ACID, PLASMA: Lactic Acid, Venous: 1.3 mmol/L (ref 0.5–1.9)

## 2022-05-10 LAB — TROPONIN I (HIGH SENSITIVITY)
Troponin I (High Sensitivity): 164 ng/L (ref <18)
Troponin I (High Sensitivity): 149 ng/L (ref <18)

## 2022-05-10 MED ORDER — PREDNISONE 10 MG PO TABS
5.0000 mg | ORAL_TABLET | Freq: Every day | ORAL | Status: DC
  Administered 2022-05-11 – 2022-05-15 (×5): 5 mg via ORAL
  Filled 2022-05-10 (×5): qty 1

## 2022-05-10 MED ORDER — DILTIAZEM HCL ER COATED BEADS 300 MG PO CP24
300.0000 mg | ORAL_CAPSULE | Freq: Every day | ORAL | Status: DC
  Administered 2022-05-11 – 2022-05-20 (×10): 300 mg via ORAL
  Filled 2022-05-10 (×11): qty 1

## 2022-05-10 MED ORDER — APIXABAN 2.5 MG PO TABS
2.5000 mg | ORAL_TABLET | Freq: Two times a day (BID) | ORAL | Status: DC
  Administered 2022-05-10 – 2022-05-20 (×21): 2.5 mg via ORAL
  Filled 2022-05-10 (×23): qty 1

## 2022-05-10 MED ORDER — SODIUM CHLORIDE 0.9 % IV SOLN
2.0000 g | INTRAVENOUS | Status: DC
  Administered 2022-05-11: 2 g via INTRAVENOUS
  Filled 2022-05-10: qty 2

## 2022-05-10 MED ORDER — POTASSIUM CHLORIDE 10 MEQ/100ML IV SOLN
10.0000 meq | INTRAVENOUS | Status: AC
  Administered 2022-05-10: 10 meq via INTRAVENOUS
  Filled 2022-05-10: qty 100

## 2022-05-10 MED ORDER — UMECLIDINIUM BROMIDE 62.5 MCG/ACT IN AEPB
1.0000 | INHALATION_SPRAY | Freq: Every day | RESPIRATORY_TRACT | Status: DC
  Administered 2022-05-11 – 2022-05-21 (×11): 1 via RESPIRATORY_TRACT
  Filled 2022-05-10 (×2): qty 7

## 2022-05-10 MED ORDER — SENNOSIDES-DOCUSATE SODIUM 8.6-50 MG PO TABS
2.0000 | ORAL_TABLET | Freq: Two times a day (BID) | ORAL | Status: DC
  Administered 2022-05-10 – 2022-05-20 (×21): 2 via ORAL
  Filled 2022-05-10 (×22): qty 2

## 2022-05-10 MED ORDER — POTASSIUM CHLORIDE 10 MEQ/100ML IV SOLN
10.0000 meq | Freq: Once | INTRAVENOUS | Status: AC
  Administered 2022-05-10: 10 meq via INTRAVENOUS
  Filled 2022-05-10: qty 100

## 2022-05-10 MED ORDER — BISACODYL 10 MG RE SUPP
10.0000 mg | Freq: Once | RECTAL | Status: AC
  Administered 2022-05-10: 10 mg via RECTAL
  Filled 2022-05-10: qty 1

## 2022-05-10 MED ORDER — ACETAMINOPHEN 325 MG PO TABS
650.0000 mg | ORAL_TABLET | Freq: Four times a day (QID) | ORAL | Status: DC | PRN
  Administered 2022-05-13: 650 mg via ORAL
  Filled 2022-05-10: qty 2

## 2022-05-10 MED ORDER — SODIUM CHLORIDE 0.9 % IV BOLUS
500.0000 mL | Freq: Once | INTRAVENOUS | Status: AC
  Administered 2022-05-10: 500 mL via INTRAVENOUS

## 2022-05-10 MED ORDER — POTASSIUM CHLORIDE CRYS ER 20 MEQ PO TBCR
40.0000 meq | EXTENDED_RELEASE_TABLET | Freq: Once | ORAL | Status: AC
  Administered 2022-05-10: 40 meq via ORAL
  Filled 2022-05-10: qty 2

## 2022-05-10 MED ORDER — ASPIRIN 81 MG PO TBEC
81.0000 mg | DELAYED_RELEASE_TABLET | Freq: Every day | ORAL | Status: DC
  Administered 2022-05-11 – 2022-05-20 (×10): 81 mg via ORAL
  Filled 2022-05-10 (×11): qty 1

## 2022-05-10 MED ORDER — SODIUM CHLORIDE 0.9 % IV SOLN
500.0000 mg | INTRAVENOUS | Status: DC
  Administered 2022-05-10: 500 mg via INTRAVENOUS
  Filled 2022-05-10: qty 500

## 2022-05-10 MED ORDER — MORPHINE SULFATE (PF) 4 MG/ML IV SOLN
4.0000 mg | Freq: Once | INTRAVENOUS | Status: AC
  Administered 2022-05-10: 4 mg via INTRAVENOUS
  Filled 2022-05-10: qty 1

## 2022-05-10 MED ORDER — ATORVASTATIN CALCIUM 20 MG PO TABS
40.0000 mg | ORAL_TABLET | Freq: Every day | ORAL | Status: DC
  Administered 2022-05-10 – 2022-05-20 (×11): 40 mg via ORAL
  Filled 2022-05-10 (×11): qty 2

## 2022-05-10 MED ORDER — METOPROLOL TARTRATE 50 MG PO TABS
50.0000 mg | ORAL_TABLET | Freq: Two times a day (BID) | ORAL | Status: DC
  Administered 2022-05-10 – 2022-05-20 (×21): 50 mg via ORAL
  Filled 2022-05-10 (×22): qty 1

## 2022-05-10 MED ORDER — HYDROCODONE-ACETAMINOPHEN 5-325 MG PO TABS
1.0000 | ORAL_TABLET | ORAL | Status: DC | PRN
  Administered 2022-05-11 – 2022-05-17 (×4): 1 via ORAL
  Filled 2022-05-10 (×7): qty 1

## 2022-05-10 MED ORDER — ACETAMINOPHEN 325 MG PO TABS
650.0000 mg | ORAL_TABLET | Freq: Once | ORAL | Status: AC
  Administered 2022-05-10: 650 mg via ORAL
  Filled 2022-05-10: qty 2

## 2022-05-10 MED ORDER — MAGNESIUM SULFATE 2 GM/50ML IV SOLN
2.0000 g | Freq: Once | INTRAVENOUS | Status: AC
  Administered 2022-05-10: 2 g via INTRAVENOUS
  Filled 2022-05-10: qty 50

## 2022-05-10 MED ORDER — CIPROFLOXACIN HCL 500 MG PO TABS
500.0000 mg | ORAL_TABLET | Freq: Two times a day (BID) | ORAL | Status: DC
  Administered 2022-05-11: 500 mg via ORAL
  Filled 2022-05-10: qty 1

## 2022-05-10 MED ORDER — BISACODYL 5 MG PO TBEC
5.0000 mg | DELAYED_RELEASE_TABLET | Freq: Once | ORAL | Status: AC
  Administered 2022-05-10: 5 mg via ORAL
  Filled 2022-05-10: qty 1

## 2022-05-10 MED ORDER — ONDANSETRON HCL 4 MG/2ML IJ SOLN
4.0000 mg | Freq: Once | INTRAMUSCULAR | Status: AC
  Administered 2022-05-10: 4 mg via INTRAVENOUS
  Filled 2022-05-10: qty 2

## 2022-05-10 MED ORDER — POLYETHYLENE GLYCOL 3350 17 G PO PACK
17.0000 g | PACK | Freq: Every day | ORAL | Status: DC
  Administered 2022-05-11 – 2022-05-20 (×9): 17 g via ORAL
  Filled 2022-05-10 (×11): qty 1

## 2022-05-10 MED ORDER — TORSEMIDE 20 MG PO TABS: 20.0000 mg | ORAL_TABLET | Freq: Every day | ORAL | Status: DC

## 2022-05-10 MED ORDER — VITAMIN D (ERGOCALCIFEROL) 1.25 MG (50000 UNIT) PO CAPS
50000.0000 [IU] | ORAL_CAPSULE | ORAL | Status: DC
  Administered 2022-05-12 – 2022-05-19 (×2): 50000 [IU] via ORAL
  Filled 2022-05-10 (×2): qty 1

## 2022-05-10 MED ORDER — FLUTICASONE FUROATE-VILANTEROL 100-25 MCG/ACT IN AEPB
1.0000 | INHALATION_SPRAY | Freq: Every day | RESPIRATORY_TRACT | Status: DC
  Administered 2022-05-11 – 2022-05-21 (×11): 1 via RESPIRATORY_TRACT
  Filled 2022-05-10: qty 28

## 2022-05-10 MED ORDER — ALBUTEROL SULFATE (2.5 MG/3ML) 0.083% IN NEBU
2.5000 mg | INHALATION_SOLUTION | Freq: Four times a day (QID) | RESPIRATORY_TRACT | Status: DC
  Administered 2022-05-10 – 2022-05-13 (×10): 2.5 mg via RESPIRATORY_TRACT
  Filled 2022-05-10 (×10): qty 3

## 2022-05-10 MED ORDER — SODIUM CHLORIDE 0.9 % IV SOLN
1.0000 g | Freq: Once | INTRAVENOUS | Status: AC
  Administered 2022-05-10: 1 g via INTRAVENOUS
  Filled 2022-05-10: qty 10

## 2022-05-10 MED ORDER — IOHEXOL 350 MG/ML SOLN
75.0000 mL | Freq: Once | INTRAVENOUS | Status: AC | PRN
  Administered 2022-05-10: 75 mL via INTRAVENOUS

## 2022-05-10 NOTE — ED Notes (Addendum)
Floor RN is coming for the patient. Dinner tray at bedside.

## 2022-05-10 NOTE — ED Notes (Signed)
Patient c/o pain at IV site with potassium administration. Greig Right PA-C informed and ordered NS to run with potassium IV.

## 2022-05-10 NOTE — ED Triage Notes (Signed)
Pt BIBA from [facility]. Pt reports constipation for the past 8 days. Pt had Left toe amputation last week. Pt on hydrocodone for pain. Pt reports taking stool softeners but no laxatives.

## 2022-05-10 NOTE — ED Provider Notes (Addendum)
Physical Exam  BP 137/76   Pulse 98   Temp 98.1 F (36.7 C) (Oral)   Resp 16   Ht 5\' 9"  (1.753 m)   Wt 64.4 kg   SpO2 97%   BMI 20.97 kg/m   Physical Exam  Procedures  .Critical Care  Performed by: Faythe Ghee, PA-C Authorized by: Faythe Ghee, PA-C   Critical care provider statement:    Critical care time (minutes):  30   Critical care time was exclusive of:  Separately billable procedures and treating other patients   Critical care was necessary to treat or prevent imminent or life-threatening deterioration of the following conditions:  Cardiac failure and metabolic crisis   Critical care was time spent personally by me on the following activities:  Development of treatment plan with patient or surrogate, discussions with consultants, evaluation of patient's response to treatment, examination of patient, ordering and review of laboratory studies, ordering and review of radiographic studies, ordering and performing treatments and interventions, pulse oximetry, re-evaluation of patient's condition, review of old charts, interpretation of cardiac output measurements and obtaining history from patient or surrogate   Care discussed with: admitting provider     ED Course / MDM    Medical Decision Making Amount and/or Complexity of Data Reviewed Labs: ordered. Radiology: ordered.  Risk OTC drugs. Prescription drug management. Decision regarding hospitalization.   Accepting care from Kem [place], FNP Family called and is concerned he might be septic even though the only complaint we received from the patient and EMS was constipation.  However, due to family concerns and patient is on second course of antibiotics with do septic work up.  Patients vitals appear to be normal at this time, will not initiate fluids as he is not tachycardic.    Labs and cxr ordered as patient does have a cough  Male condom ordered as patient did double his Lasix today.  He is urinating  constantly cannot bear weight due to the recent surgery on his foot.  Patient is on 4 L O2 at home, remains on 4 L O2 here in the ED.  O2 saturations 95%  Labs are concerning for hypokalemia critical level of 2.7, concerning for CHF with a BNP of 997,  Other labs are reassuring, troponin is pending  Patient placed on cardiac monitor, hypokalemia and CHF combined like to monitor patient's stability  CT angio of the chest for PE and CT abdomen pelvis IV contrast were independently reviewed and interpreted by me.  I did review the radiologist report.  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  Due to the patient's recent surgical admission to the hospital less than a week ago combined with the pneumonia we will consider this to be a hospital-acquired pneumonia, patient is currently on Cipro and Augmentin.  Feel that he has failed outpatient antibiotics.  Also due to the weakness loss combined with the hypokalemia feel patient needs to be admitted.  Patient and his daughter are in agreement with admission.  Consult to hospitalist.  Will go ahead and start cefepime IV for the hospital-acquired pneumonia  I did not do sepsis as the lactic acid is normal, white count is normal, do not consider him to be septic as he is not tachycardic.  Spoke with hospitalist.  Will be admitting the patient.  He is in stable condition at time of admission.  Faythe Ghee, PA-C 05/10/22 1743 Troponin elevated at 164, I did notify the hospitalist of the critical level   Bartholomew Boards 05/10/22 1755    Pilar Jarvis, MD 05/10/22 Lynelle Smoke    Pilar Jarvis, MD 05/10/22 Nicholos Johns    Pilar Jarvis, MD 05/10/22 [phone]

## 2022-05-10 NOTE — ED Notes (Signed)
Greig Right PA-C aware of potassium of 2.7

## 2022-05-10 NOTE — ED Provider Notes (Signed)
[facility] Provider Note    Event Date/Time   First MD Initiated Contact with Patient 05/10/22 1257     (approximate)   History   Constipation   HPI  SOCRATES CAHOON is a 89 y.o. male with history of recent left great toe amputation now taking Norco and as listed in EMR presents to the emergency department for evaluation of constipation.  No bowel movement for the past 8 days despite taking stool softeners.  He also states he has not had much of an appetite and so has not eaten very well.  He denies abdominal pain, nausea, or vomiting.Marland Kitchen      Physical Exam   Triage Vital Signs: ED Triage Vitals  Enc Vitals Group     BP 05/10/22 1250 (!) 138/100     Pulse Rate 05/10/22 1250 98     Resp 05/10/22 1250 16     Temp 05/10/22 1250 98.1 F (36.7 C)     Temp Source 05/10/22 1250 Oral     SpO2 05/10/22 1250 97 %     Weight 05/10/22 1251 142 lb (64.4 kg)     Height 05/10/22 1251  (1.753 m)     Head Circumference --      Peak Flow --      Pain Score 05/10/22 1251 5     Pain Loc --      Pain Edu? --      Excl. in GC? --     Most recent vital signs: Vitals:   05/10/22 1250  BP: (!) 138/100  Pulse: 98  Resp: 16  Temp: 98.1 F (36.7 C)  SpO2: 97%    General: Awake, no distress.  CV:  Good peripheral perfusion.  Resp:  Normal effort.  Abd:  No distention.  Soft, nontender.  Bowel sounds are active x 4. Other:     ED Results / Procedures / Treatments   Labs (all labs ordered are listed, but only abnormal results are displayed) Labs Reviewed - No data to display   EKG  Not indicated   RADIOLOGY  Image and radiology report reviewed and interpreted by me. Radiology report consistent with the same.  Image of the abdomen shows moderate stool throughout the colon without any evidence of bowel obstruction  PROCEDURES:  Critical Care performed: No  Procedures   MEDICATIONS ORDERED IN ED:  Medications  acetaminophen  (TYLENOL) tablet 650 mg (has no administration in time range)  bisacodyl (DULCOLAX) suppository 10 mg (10 mg Rectal Given 05/10/22 1454)  bisacodyl (DULCOLAX) EC tablet 5 mg (5 mg Oral Given 05/10/22 1454)     IMPRESSION / MDM / ASSESSMENT AND PLAN / ED COURSE   I have reviewed the triage note.  Differential diagnosis includes, but is not limited to, bowel obstruction, constipation, ileus, volvulus  [name] presentation is most consistent with acute complicated illness / injury requiring diagnostic workup.  89 year old male presenting to the emergency department for treatment and evaluation after not having had a bowel movement over the past several days.  See HPI for further details.  X-ray shows moderate stool throughout the colon without any evidence of small bowel obstruction or free air.  Exam is reassuring.  Abdomen is soft.  Bowel sounds are active x 4.  Plan will be to give him a Dulcolax suppository and then 1 Dulcolax tablet by mouth.    FINAL CLINICAL IMPRESSION(S) / ED DIAGNOSES   Final diagnoses:  Drug-induced constipation  Rx / DC Orders   ED Discharge Orders     None        Note:  This document was prepared using Dragon voice recognition software and may include unintentional dictation errors.   Chinita Pester, FNP 05/10/22 1519    Sharyn Creamer, MD 05/11/22 [phone]

## 2022-05-10 NOTE — H&P (Addendum)
History and Physical:    [name]   WUJ:[id number] DOB: 01/22/1929 DOA: 05/10/2022  Referring MD/provider: Greig Right, PA PCP: Gracelyn Nurse, MD   Patient coming from: Home  Chief Complaint: Constipation and shortness of breath  History of Present Illness:   [name] is a 89 y.o. male with multiple medical problems including but not limited to recent left great toe amputation for osteomyelitis left great toe, chronic diastolic CHF, PVD, CAD, right carotid artery stenosis, aortic stenosis, right bundle branch block, history of COVID-19 infection, history of aspiration pneumonia, COPD, chronic hypoxic respiratory failure on 4 L oxygen, atrial fibrillation, anemia of chronic disease history was obtained from the patient and [name] (his daughter) at the bedside.  EMR chart was also reviewed.  He presented to the hospital because of increasing shortness of breath, easy fatigability, poor oral intake and constipation.  He has not been feeling well since he had left great toe amputation on 05/03/2022.  Because of easy fatigability and exertional shortness of breath he has mostly been sedentary.  He has not moved his bowels for about 8 days.  He has had a lot of nausea/dry heaving and vomiting mucus.  No abdominal pain, chest pain, palpitations, dizziness, loss of consciousness, fever or chills.  ED Course:  The patient was given IV potassium in the ED because of low potassium.  ROS:   ROS other systems reviewed were negative.  Past Medical History:   Past Medical History:  Diagnosis Date   (HFpEF) heart failure with preserved ejection fraction    a.) TTE 01/06/2019: EF 55-60%, mild LVH, BAE, mild-mod MR, triv TR, PASP 50.3, G1DD; b.) TTE 10/10/2019: EF 55-60%, mod reduced RVSF, mild LAE, sev RAE, MAC, mod-sec TR. AoV sclerosis, G1DD; c.) TTE 03/01/2022: EF 60-65%, mild LVH, reduced RVSF, mod RVE, PASP 77.7, mod LAE, sev RAE, mild-mod MR, mod TR, mild AR   Aortic  atherosclerosis    Aortic stenosis 03/01/2022   a.) TTE 03/01/2022: moderate AS (MPG 25.5); VTI = 0.86 cm   Aspiration pneumonia    Atrial fibrillation with RVR    a.) CHA2DS2VASc = 5 (age x2, CHF, HTN, vascular disease history);  b.) rate/rhythm maintained on oral diltiazem + metoprolol tartrate; chronically anticoagulated with apixaban   Basal cell carcinoma 09/13/2021   Left cheek. EDC   Cardiac murmur    a.) grade I/VI medium pitched mid systolic blowing at lower LSB   Cardiomegaly    Carotid stenosis, right    a.) MRA head/neck 02/28/2022: severe (>90%) RICA; b.) s/p PTA and stent placement 03/18/2022 --> 9 mm proximal, 7 mm distal, 4 cm long exact stent   Central retinal artery occlusion, left    Chronic kidney disease, stage 3a    Complication of anesthesia    a.) post-operative delirium/hallucinations   COPD (chronic obstructive pulmonary disease)    Coronary artery disease    Dependence on supplemental oxygen    a.) 4L/[place]   History of 2019 novel coronavirus disease (COVID-19) 01/04/2019   HLD (hyperlipidemia)    Hypertension    Long term current use of anticoagulant    a.) apixaban   Long term current use of systemic steroids    a.) prednisone   Osteomyelitis of great toe of left foot    PAD (peripheral artery disease)    a.) s/p RIGHT femoral endarterectomy on 08/09/2020   Pneumonia due to COVID-19 virus    PVD (peripheral vascular disease)    RBBB (right  bundle branch block)    Sepsis    Skin cancer    nose and ear - tx with Mohs   Thoracic aortic ectasia    a.) measured 3.6 x 3.3 cm on 10/08/2019    Past Surgical History:   Past Surgical History:  Procedure Laterality Date   AMPUTATION TOE Left 05/03/2022   Procedure: AMPUTATION TOE;  Surgeon: Linus Galas, DPM;  Location: [facility] ORS;  Service: Podiatry;  Laterality: Left;   CAROTID PTA/STENT INTERVENTION Right 03/18/2022   Procedure: CAROTID PTA/STENT INTERVENTION;  Surgeon: Annice Needy, MD;  Location: [facility]  INVASIVE CV LAB;  Service: Cardiovascular;  Laterality: Right;   CATARACT EXTRACTION, BILATERAL Bilateral 2005   COLONOSCOPY  2007   ENDARTERECTOMY FEMORAL Right 08/09/2020   Procedure: RIGHT COMMON AND PROFUNDA FEMORAL ENDARTERECTOMY;  Surgeon: Annice Needy, MD;  Location: [facility] ORS;  Service: Vascular;  Laterality: Right;   LOWER EXTREMITY ANGIOGRAPHY Right 07/05/2020   Procedure: LOWER EXTREMITY ANGIOGRAPHY;  Surgeon: Annice Needy, MD;  Location: [facility] INVASIVE CV LAB;  Service: Cardiovascular;  Laterality: Right;   LOWER EXTREMITY ANGIOGRAPHY Left 04/18/2022   Procedure: Lower Extremity Angiography;  Surgeon: Annice Needy, MD;  Location: [facility] INVASIVE CV LAB;  Service: Cardiovascular;  Laterality: Left;   Renal Bypass Surgery Bilateral 2000    Social History:   Social History   Socioeconomic History   Marital status: Widowed    Spouse name: Not on file   Number of children: 1   Years of education: Not on file   Highest education level: Not on file  Occupational History   Not on file  Tobacco Use   Smoking status: Former    Packs/day: 1.00    Years: 30.00    Additional pack years: 0.00    Total pack years: 30.00    Types: Cigarettes    Quit date: 26    Years since quitting: 27.2   Smokeless tobacco: Never  Vaping Use   Vaping Use: Never used  Substance and Sexual Activity   Alcohol use: Not Currently   Drug use: Never   Sexual activity: Not Currently  Other Topics Concern   Not on file  Social History Narrative   Lives at home with 1 daughter.   Social Determinants of Health   Financial Resource Strain: Not on file  Food Insecurity: No Food Insecurity (03/27/2022)   Hunger Vital Sign    Worried About Running Out of Food in the Last Year: Never true    Ran Out of Food in the Last Year: Never true  Transportation Needs: No Transportation Needs (03/27/2022)   PRAPARE - Administrator, Civil Service (Medical): No    Lack of Transportation (Non-Medical):  No  Physical Activity: Not on file  Stress: Not on file  Social Connections: Not on file  Intimate Partner Violence: Not At Risk (03/27/2022)   Humiliation, Afraid, Rape, and Kick questionnaire    Fear of Current or Ex-Partner: No    Emotionally Abused: No    Physically Abused: No    Sexually Abused: No    Allergies   Bee venom  Family history:   History reviewed. No pertinent family history.  Current Medications:   Prior to Admission medications   Medication Sig Start Date End Date Taking? Authorizing Provider  acetaminophen (TYLENOL) 500 MG tablet Take 500 mg by mouth every 6 (six) hours as needed for fever or pain.    [provider]  albuterol (PROVENTIL) (2.5 MG/3ML)  0.083% nebulizer solution Take 2.5 mg by nebulization 4 (four) times daily.    [provider]  alendronate (FOSAMAX) 70 MG tablet Take 70 mg by mouth once a week. Sunday morning with full glass of water and sit up do not lay down 06/07/20   [provider]  apixaban (ELIQUIS) 2.5 MG TABS tablet Take 2.5 mg by mouth 2 (two) times daily.    [provider]  ascorbic acid (VITAMIN C) 250 MG tablet Take 1 tablet (250 mg total) by mouth 2 (two) times daily. Patient taking differently: Take 250 mg by mouth daily. 08/11/20   Stegmayer, Ranae Plumber, PA-C  aspirin EC 81 MG EC tablet Take 1 tablet (81 mg total) by mouth daily at 6 (six) AM. Swallow whole. 08/11/20   Stegmayer, Cala Bradford A, PA-C  atorvastatin (LIPITOR) 40 MG tablet Take 1 tablet (40 mg total) by mouth daily. Patient taking differently: Take 40 mg by mouth at bedtime. 03/01/22 05/30/22  Gillis Santa, MD  Bacillus Coagulans-Inulin (BENEFIBER PREBIOTIC+PROBIOTIC) CHEW Chew 2 tablets by mouth daily.    [provider]  Calcium Carb-Cholecalciferol (CALCIUM 600/VITAMIN D PO) Take 1 tablet by mouth daily.    [provider]  CARTIA XT 300 MG 24 hr capsule Take 300 mg by mouth daily. 09/16/19   [provider]   Fluticasone-Umeclidin-Vilant (TRELEGY ELLIPTA) 100-62.5-25 MCG/INH AEPB Inhale 1 puff into the lungs daily.    [provider]  HYDROcodone-acetaminophen (NORCO/VICODIN) 5-325 MG tablet Take 1 tablet by mouth every 4 (four) hours as needed for moderate pain. 05/03/22 05/03/23  Linus Galas, DPM  magnesium oxide (MAG-OX) 400 (240 Mg) MG tablet Take 1 tablet (400 mg total) by mouth daily. 03/30/22   Alford Highland, MD  metolazone (ZAROXOLYN) 2.5 MG tablet Take 2.5 mg by mouth daily as needed (per heart failure instructions).    [provider]  metoprolol tartrate (LOPRESSOR) 50 MG tablet Take 50 mg by mouth 2 (two) times daily.    [provider]  Multiple Vitamin (MULTIVITAMIN WITH MINERALS) TABS tablet Take 1 tablet by mouth daily. 08/12/20   Stegmayer, Cala Bradford A, PA-C  OXYGEN Inhale 4 L into the lungs continuous.    [provider]  potassium chloride (KLOR-CON) 10 MEQ tablet Take 2 tablets (20 mEq total) by mouth daily. Patient taking differently: Take 10 mEq by mouth daily. Monday and Friday takes 20 meq 01/14/20   Clarisa Kindred A, FNP  povidone-iodine (BETADINE) 10 % external solution Apply 1 Application topically daily. Left great toe    [provider]  predniSONE (DELTASONE) 5 MG tablet Take 5 mg by mouth daily with breakfast.    [provider]  senna-docusate (SENOKOT-S) 8.6-50 MG tablet Take 2 tablets by mouth 2 (two) times daily.    [provider]  torsemide (DEMADEX) 20 MG tablet Take 40mg  on M, W, F and 20mg  on Tu, Thur,Sat & Sunday Patient taking differently: Take 20 mg by mouth daily. Take 40mg  on Monday and  Friday 01/11/22   Delma Freeze, FNP  traMADol (ULTRAM) 50 MG tablet Take 50 mg by mouth every 6 (six) hours as needed. 04/08/22   [provider]  Vitamin D, Ergocalciferol, (DRISDOL) 1.25 MG (50000 UNIT) CAPS capsule Take 1 capsule (50,000 Units total) by mouth every 7 (seven) days. Patient taking  differently: Take 50,000 Units by mouth every 7 (seven) days. Sunday 10/15/19   Kathrynn Running, MD    Physical Exam:   Vitals:   05/10/22  1430 05/10/22 1730 05/10/22 1800 05/10/22 1818  BP: 137/76 (!) 141/76 134/74   Pulse:  (!) 108 (!) 123 (!) 106  Resp:      Temp:    98.6 F (37 C)  TempSrc:    Oral  SpO2:  94% 95% 96%  Weight:      Height:         Physical Exam: Blood pressure 134/74, pulse (!) 106, temperature 98.6 F (37 C), temperature source Oral, resp. rate 16, height 5\' 9"  (1.753 m), weight 64.4 kg, SpO2 96 %. Gen: No acute distress. Head: Normocephalic, atraumatic. Eyes: Pupils equal, round and reactive to light. Extraocular movements intact.  Sclerae nonicteric.  Mouth: Moist mucous membranes Neck: Supple, no thyromegaly, no lymphadenopathy, no jugular venous distention. Chest: Air entry adequate bilaterally, rhonchi more prominent on the left lung base.  No rales heard CV: Heart sounds are regular with an S1, S2. No murmurs, rubs or gallops.  Abdomen: Soft, nontender, nondistended with normal active bowel sounds. No palpable masses. Extremities: Bilateral leg edema mild left tenderness.  Skin: Warm and dry.  Bruises on both upper and lower extremities Neuro: Alert and oriented times 3; grossly nonfocal.  Psych: Insight is good and judgment is appropriate. Mood and affect normal.   Data Review:    Labs: Basic Metabolic Panel: Recent Labs  Lab 05/10/22 1529  NA 139  K 2.7*  CL 96*  CO2 30  GLUCOSE 99  BUN 22  CREATININE 1.02  CALCIUM 8.5*  MG 1.7   Liver Function Tests: Recent Labs  Lab 05/10/22 1529  AST 38  ALT 15  ALKPHOS 66  BILITOT 0.6  PROT 6.8  ALBUMIN 2.9*   No results for input(s): "LIPASE", "AMYLASE" in the last 168 hours. No results for input(s): "AMMONIA" in the last 168 hours. CBC: Recent Labs  Lab 05/10/22 1529  WBC 10.5  NEUTROABS 8.1*  HGB 8.8*  HCT 27.7*  MCV 93.9  PLT 295   Cardiac Enzymes: No results for  input(s): "CKTOTAL", "CKMB", "CKMBINDEX", "TROPONINI" in the last 168 hours.  BNP (last 3 results) No results for input(s): "PROBNP" in the last 8760 hours. CBG: No results for input(s): "GLUCAP" in the last 168 hours.  Urinalysis    Component Value Date/Time   COLORURINE STRAW (A) 05/10/2022 1530   APPEARANCEUR HAZY (A) 05/10/2022 1530   LABSPEC 1.005 05/10/2022 1530   PHURINE 6.0 05/10/2022 1530   GLUCOSEU NEGATIVE 05/10/2022 1530   HGBUR SMALL (A) 05/10/2022 1530   BILIRUBINUR NEGATIVE 05/10/2022 1530   KETONESUR NEGATIVE 05/10/2022 1530   PROTEINUR NEGATIVE 05/10/2022 1530   NITRITE NEGATIVE 05/10/2022 1530   LEUKOCYTESUR LARGE (A) 05/10/2022 1530      Radiographic Studies: CT Angio Chest PE W and/or Wo Contrast  Result Date: 05/10/2022 CLINICAL DATA:  Pleural effusion on chest x-ray EXAM: CT ANGIOGRAPHY CHEST WITH CONTRAST TECHNIQUE: Multidetector CT imaging of the chest was performed using the standard protocol during bolus administration of intravenous contrast. Multiplanar CT image reconstructions and MIPs were obtained to evaluate the vascular anatomy. RADIATION DOSE REDUCTION: This exam was performed according to the departmental dose-optimization program which includes automated exposure control, adjustment of the mA and/or kV according to patient size and/or use of iterative reconstruction technique. CONTRAST:  75mL OMNIPAQUE IOHEXOL 350 MG/ML SOLN COMPARISON:  Chest x-ray 05/10/2022, CT chest 10/08/2019 FINDINGS: Cardiovascular: Satisfactory opacification of the pulmonary arteries to the segmental level. No evidence of pulmonary embolism. Moderate severe aortic atherosclerosis. No aneurysm. Coronary vascular  calcifications. Cardiomegaly. No pericardial effusion. Mediastinum/Nodes: Midline trachea. No thyroid mass. No suspicious lymph nodes. Esophagus within normal limits. Lungs/Pleura: Emphysema. Apical pleural and parenchymal scarring with calcifications, unchanged. Small  bilateral pleural effusions. No pneumothorax. Mild airspace disease at the left lung base. Upper Abdomen: No acute finding Musculoskeletal: Chronic superior endplate fractures at T3 and T4. Chronic compression fractures at T5 and T7. Chronic L1 and L2 compression fractures. Old sternal fracture. Review of the MIP images confirms the above findings. IMPRESSION: 1. Negative for acute pulmonary embolus. 2. Emphysema. Mild airspace disease at the left lung base which could be due to atelectasis pneumonia or scarring. 3. Cardiomegaly with small bilateral pleural effusions 4. Multiple chronic compression fractures of the thoracic and lumbar spine. 5. Aortic atherosclerosis. Aortic Atherosclerosis (ICD10-I70.0) and Emphysema (ICD10-J43.9). Electronically Signed   By: Jasmine Pang M.D.   On: 05/10/2022 17:17   CT ABDOMEN PELVIS W CONTRAST  Result Date: 05/10/2022 CLINICAL DATA:  Abdominal pain. EXAM: CT ABDOMEN AND PELVIS WITH CONTRAST TECHNIQUE: Multidetector CT imaging of the abdomen and pelvis was performed using the standard protocol following bolus administration of intravenous contrast. RADIATION DOSE REDUCTION: This exam was performed according to the departmental dose-optimization program which includes automated exposure control, adjustment of the mA and/or kV according to patient size and/or use of iterative reconstruction technique. CONTRAST:  51mL OMNIPAQUE IOHEXOL 350 MG/ML SOLN COMPARISON:  None Available. FINDINGS: Lower chest: Advanced changes of emphysema noted in the lung bases. There is spiculated masslike consolidative opacity in the posterior left lower lobe with small left and tiny right pleural effusions. Hepatobiliary: No suspicious focal abnormality within the liver parenchyma. Mild intrahepatic biliary duct dilatation evident. Gallbladder is nondistended. Common bile duct in the head of the pancreas measures 8 mm diameter, upper normal for patient age. Pancreas: No focal mass lesion. No  dilatation of the main duct. No intraparenchymal cyst. No peripancreatic edema. Spleen: No splenomegaly. No focal mass lesion. Adrenals/Urinary Tract: No adrenal nodule or mass. 3 mm nonobstructing stone identified interpolar right kidney with a 3 mm nonobstructing stone in the lower pole right kidney. No left-sided nephrolithiasis. Small bilateral low-density renal lesions are likely cysts. No followup imaging is recommended. No evidence for hydroureter. The urinary bladder appears normal for the degree of distention. Stomach/Bowel: Stomach is unremarkable. No gastric wall thickening. No evidence of outlet obstruction. Duodenum is normally positioned as is the ligament of Treitz. No small bowel wall thickening. No small bowel dilatation. The terminal ileum is normal. The appendix is normal. No gross colonic mass. No colonic wall thickening. No substantial diverticular disease. Vascular/Lymphatic: Status post aorto bi femoral bypass graft placement. Potential aneurysm/pseudoaneurysm in the right groin at the anastomosis. There is no gastrohepatic or hepatoduodenal ligament lymphadenopathy. No retroperitoneal or mesenteric lymphadenopathy. No pelvic sidewall lymphadenopathy. Reproductive: The prostate gland and seminal vesicles are unremarkable. Other: No intraperitoneal free fluid. Musculoskeletal: Bones are diffusely demineralized. No worrisome lytic or sclerotic osseous abnormality. Compression fractures are seen at all levels from L1 through L5. IMPRESSION: 1. No acute findings in the abdomen or pelvis. 2. Irregular masslike consolidative opacity in the posterior left lower lobe is likely related to pneumonia although close follow-up recommended as neoplasm not excluded. Small left and tiny right pleural effusions. 3. Mild intrahepatic biliary duct dilatation. Common bile duct in the head of the pancreas measures 8 mm diameter, upper normal for patient age. Correlation with liver function test may prove helpful.  4. Nonobstructing right renal stones. 5. Status post aorto bi femoral  bypass graft placement. Potential aneurysm/pseudoaneurysm in the right groin at the anastomosis. 6. Compression fracture involving all lumbar levels from L1-L5, stable since MRI 10/09/2019. Electronically Signed   By: Kennith Center M.D.   On: 05/10/2022 17:10   DG Chest Portable 1 View  Result Date: 05/10/2022 CLINICAL DATA:  Cough.  Some shortness of breath EXAM: PORTABLE CHEST 1 VIEW COMPARISON:  03/27/2022 and older studies.  CT, 10/08/2019. FINDINGS: Cardiac silhouette is normal in size. No mediastinal or hilar masses. Bilateral interstitial thickening. Areas lung scarring most evident at the apices and bases. Blunting the lateral left costophrenic sulcus is consistent with a small effusion. No convincing pneumonia. No pneumothorax. Skeletal structures are grossly intact. IMPRESSION: 1. Predominantly chronic findings in the lungs including interstitial thickening and scarring. A component interstitial edema should be considered in the proper clinical setting. Small left effusion. No convincing pneumonia. Electronically Signed   By: Amie Portland M.D.   On: 05/10/2022 15:48   DG Abdomen 1 View  Result Date: 05/10/2022 CLINICAL DATA:  Constipation. EXAM: ABDOMEN - 1 VIEW COMPARISON:  None Available. FINDINGS: Moderate fecal material throughout the colon. No evidence of small-bowel obstruction or gross signs of free intraperitoneal air. No abnormal calcifications. Visualized bony structures are grossly unremarkable. IMPRESSION: Moderate fecal material throughout the colon. Electronically Signed   By: Irish Lack M.D.   On: 05/10/2022 13:30    EKG: Independently reviewed by me showed atrial fibrillation with RVR, right bundle branch block.    Assessment/Plan:   Principal Problem:   Community acquired pneumonia Active Problems:   Hypokalemia   Chronic diastolic CHF (congestive heart failure)   COPD (chronic obstructive  pulmonary disease)   Chronic respiratory failure with hypoxia   Atrial fibrillation with RVR   Coronary artery disease   Constipation   PVD (peripheral vascular disease)   Elevated troponin    Body mass index is 20.97 kg/m.   Left lower lobe pneumonia: Admit to MedSurg.  He is receiving IV cefepime in the ED.  Treat with empiric IV antibiotics (ceftriaxone and azithromycin)   Hypokalemia: Replete potassium and monitor levels.  Magnesium is low normal.  Give IV magnesium sulfate for repletion because of A-fib with RVR.   Elevated troponin: No chest pain.  This is likely due to demand ischemia from infection.  Repeat troponins.   Constipation: No acute abnormality or bowel obstruction noted on CT abdomen.  Treat with laxatives.   Chronic diastolic CHF: Continue torsemide.   COPD with chronic hypoxic respiratory failure: Continue 4 L/min oxygen which is at baseline.   Atrial fibrillation with RVR: Continue home metoprolol and Cardizem.  Continue Eliquis.  Replete electrolytes.   Recent left great toe osteomyelitis s/p left great toe amputation on 05/03/2022: Wound culture showed Pseudomonas aeruginosa infection.  He is on ciprofloxacin (to be completed on 05/16/2022).   Other comorbidities include paroxysmal atrial fibrillation, CAD, PVD, recent left great toe amputation for left great toe osteomyelitis    Other information:   DVT prophylaxis: apixaban (ELIQUIS) tablet 2.5 mg Start: 05/10/22 2200Continue home dose of Eliquis apixaban (ELIQUIS) tablet 2.5 mg  Code Status: DNR. Family Communication: Plan discussed with [name], daughter, at the bedside Disposition Plan: Plan to discharge home in 1-2 days Consults called: None Admission status: Observation      Paislynn Hegstrom [facility] Pager: Please check [email or web address]   How to contact the [facility] Attending or Consulting provider 7A - 7P or covering provider during after hours 7P -7A, for this patient?  Check  the care team in [facility] and look for a) attending/consulting [facility] provider listed and b) the [facility] team listed Log into [email or web address] and use [place] [facility] [place]'s universal password to access. If you do not have the password, please contact the hospital operator. Locate the [facility] provider you are looking for under [facility] and page to a number that you can be directly reached. If you still have difficulty reaching the provider, please page the [facility] (Director on Call) for the Hospitalists listed on amion for assistance.  05/10/2022, 6:34 PM

## 2022-05-10 NOTE — Discharge Instructions (Signed)
Please continue the stool softeners you  have at home.

## 2022-05-10 NOTE — ED Notes (Signed)
Beth RN from the floor is here with hospital bed to transport patient to the floor. Patient has his cell phone and clothing with him upon admission. Patient denies having his wallet.

## 2022-05-10 NOTE — ED Notes (Signed)
Greig Right PA-C aware of troponin of 164.

## 2022-05-10 NOTE — ED Notes (Signed)
Report given to Mary RN.

## 2022-05-11 DIAGNOSIS — I5033 Acute on chronic diastolic (congestive) heart failure: Secondary | ICD-10-CM | POA: Diagnosis not present

## 2022-05-11 DIAGNOSIS — E876 Hypokalemia: Secondary | ICD-10-CM | POA: Diagnosis not present

## 2022-05-11 DIAGNOSIS — J9611 Chronic respiratory failure with hypoxia: Secondary | ICD-10-CM

## 2022-05-11 DIAGNOSIS — I739 Peripheral vascular disease, unspecified: Secondary | ICD-10-CM | POA: Diagnosis present

## 2022-05-11 DIAGNOSIS — Z9981 Dependence on supplemental oxygen: Secondary | ICD-10-CM | POA: Diagnosis not present

## 2022-05-11 DIAGNOSIS — I5032 Chronic diastolic (congestive) heart failure: Secondary | ICD-10-CM | POA: Diagnosis not present

## 2022-05-11 DIAGNOSIS — I2510 Atherosclerotic heart disease of native coronary artery without angina pectoris: Secondary | ICD-10-CM | POA: Diagnosis present

## 2022-05-11 DIAGNOSIS — Z7901 Long term (current) use of anticoagulants: Secondary | ICD-10-CM | POA: Diagnosis not present

## 2022-05-11 DIAGNOSIS — Y838 Other surgical procedures as the cause of abnormal reaction of the patient, or of later complication, without mention of misadventure at the time of the procedure: Secondary | ICD-10-CM | POA: Diagnosis present

## 2022-05-11 DIAGNOSIS — Z515 Encounter for palliative care: Secondary | ICD-10-CM | POA: Diagnosis not present

## 2022-05-11 DIAGNOSIS — Z87891 Personal history of nicotine dependence: Secondary | ICD-10-CM | POA: Diagnosis not present

## 2022-05-11 DIAGNOSIS — J189 Pneumonia, unspecified organism: Secondary | ICD-10-CM | POA: Diagnosis not present

## 2022-05-11 DIAGNOSIS — D638 Anemia in other chronic diseases classified elsewhere: Secondary | ICD-10-CM | POA: Diagnosis present

## 2022-05-11 DIAGNOSIS — Z7189 Other specified counseling: Secondary | ICD-10-CM | POA: Diagnosis not present

## 2022-05-11 DIAGNOSIS — Y929 Unspecified place or not applicable: Secondary | ICD-10-CM | POA: Diagnosis not present

## 2022-05-11 DIAGNOSIS — N179 Acute kidney failure, unspecified: Secondary | ICD-10-CM | POA: Diagnosis not present

## 2022-05-11 DIAGNOSIS — Z66 Do not resuscitate: Secondary | ICD-10-CM | POA: Diagnosis present

## 2022-05-11 DIAGNOSIS — J449 Chronic obstructive pulmonary disease, unspecified: Secondary | ICD-10-CM | POA: Diagnosis not present

## 2022-05-11 DIAGNOSIS — E785 Hyperlipidemia, unspecified: Secondary | ICD-10-CM | POA: Diagnosis present

## 2022-05-11 DIAGNOSIS — N1831 Chronic kidney disease, stage 3a: Secondary | ICD-10-CM | POA: Diagnosis present

## 2022-05-11 DIAGNOSIS — Z89412 Acquired absence of left great toe: Secondary | ICD-10-CM | POA: Diagnosis not present

## 2022-05-11 DIAGNOSIS — B370 Candidal stomatitis: Secondary | ICD-10-CM | POA: Diagnosis present

## 2022-05-11 DIAGNOSIS — I4891 Unspecified atrial fibrillation: Secondary | ICD-10-CM | POA: Diagnosis not present

## 2022-05-11 DIAGNOSIS — J440 Chronic obstructive pulmonary disease with acute lower respiratory infection: Secondary | ICD-10-CM | POA: Diagnosis present

## 2022-05-11 DIAGNOSIS — I2489 Other forms of acute ischemic heart disease: Secondary | ICD-10-CM | POA: Diagnosis present

## 2022-05-11 DIAGNOSIS — Z85828 Personal history of other malignant neoplasm of skin: Secondary | ICD-10-CM | POA: Diagnosis not present

## 2022-05-11 DIAGNOSIS — I130 Hypertensive heart and chronic kidney disease with heart failure and stage 1 through stage 4 chronic kidney disease, or unspecified chronic kidney disease: Secondary | ICD-10-CM | POA: Diagnosis present

## 2022-05-11 DIAGNOSIS — I480 Paroxysmal atrial fibrillation: Secondary | ICD-10-CM | POA: Diagnosis present

## 2022-05-11 DIAGNOSIS — Z8616 Personal history of COVID-19: Secondary | ICD-10-CM | POA: Diagnosis not present

## 2022-05-11 DIAGNOSIS — T8149XA Infection following a procedure, other surgical site, initial encounter: Secondary | ICD-10-CM | POA: Diagnosis present

## 2022-05-11 DIAGNOSIS — J9621 Acute and chronic respiratory failure with hypoxia: Secondary | ICD-10-CM | POA: Diagnosis not present

## 2022-05-11 LAB — STREP PNEUMONIAE URINARY ANTIGEN: Strep Pneumo Urinary Antigen: NEGATIVE

## 2022-05-11 LAB — BASIC METABOLIC PANEL
Anion gap: 11 (ref 5–15)
BUN: 23 mg/dL (ref 8–23)
CO2: 29 mmol/L (ref 22–32)
Calcium: 8.3 mg/dL — ABNORMAL LOW (ref 8.9–10.3)
Chloride: 101 mmol/L (ref 98–111)
Creatinine, Ser: 1.04 mg/dL (ref 0.61–1.24)
GFR, Estimated: >60 mL/min (ref >60)
Glucose, Bld: 85 mg/dL (ref 70–99)
Potassium: 3.3 mmol/L — ABNORMAL LOW (ref 3.5–5.1)
Sodium: 141 mmol/L (ref 135–145)

## 2022-05-11 LAB — CULTURE, BLOOD (ROUTINE X 2): Culture: NO GROWTH

## 2022-05-11 LAB — HIV ANTIBODY (ROUTINE TESTING W REFLEX): HIV Screen 4th Generation wRfx: NONREACTIVE

## 2022-05-11 LAB — MAGNESIUM: Magnesium: 2.2 mg/dL (ref 1.7–2.4)

## 2022-05-11 MED ORDER — LEVOFLOXACIN 750 MG PO TABS
750.0000 mg | ORAL_TABLET | ORAL | Status: DC
  Administered 2022-05-11 – 2022-05-12 (×2): 750 mg via ORAL
  Filled 2022-05-11 (×3): qty 1

## 2022-05-11 MED ORDER — POTASSIUM CHLORIDE CRYS ER 20 MEQ PO TBCR
40.0000 meq | EXTENDED_RELEASE_TABLET | Freq: Once | ORAL | Status: AC
  Administered 2022-05-11: 40 meq via ORAL
  Filled 2022-05-11: qty 2

## 2022-05-11 MED ORDER — SORBITOL 70 % SOLN
960.0000 mL | TOPICAL_OIL | Freq: Once | ORAL | Status: AC
  Administered 2022-05-11: 960 mL via RECTAL
  Filled 2022-05-11: qty 240

## 2022-05-11 MED ORDER — METRONIDAZOLE 500 MG PO TABS
500.0000 mg | ORAL_TABLET | Freq: Two times a day (BID) | ORAL | Status: AC
  Administered 2022-05-11 – 2022-05-17 (×13): 500 mg via ORAL
  Filled 2022-05-11 (×15): qty 1

## 2022-05-11 MED ORDER — NYSTATIN 100000 UNIT/ML MT SUSP
5.0000 mL | Freq: Four times a day (QID) | OROMUCOSAL | Status: DC
  Administered 2022-05-11 – 2022-05-20 (×35): 500000 [IU] via OROMUCOSAL
  Filled 2022-05-11 (×38): qty 5

## 2022-05-11 MED ORDER — TORSEMIDE 20 MG PO TABS: 40.0000 mg | ORAL_TABLET | ORAL | Status: DC

## 2022-05-11 MED ORDER — TORSEMIDE 20 MG PO TABS
20.0000 mg | ORAL_TABLET | ORAL | Status: DC
  Administered 2022-05-11 – 2022-05-12 (×2): 20 mg via ORAL
  Filled 2022-05-11 (×2): qty 1

## 2022-05-11 MED ORDER — NYSTATIN 100000 UNIT/ML MT SUSP: 5.0000 mL | Freq: Four times a day (QID) | OROMUCOSAL | Status: DC

## 2022-05-11 NOTE — Progress Notes (Addendum)
Progress Note    [name]  ZOX:[id number] DOB: 07/31/1928  DOA: 05/10/2022 PCP: Gracelyn Nurse, MD      Brief Narrative:    Medical records reviewed and are as summarized below:  [name] is a 89 y.o. male multiple medical problems including but not limited to recent left great toe amputation for osteomyelitis left great toe, chronic diastolic CHF, PVD, CAD, right carotid artery stenosis, aortic stenosis, right bundle branch block, history of COVID-19 infection, history of aspiration pneumonia, COPD, chronic hypoxic respiratory failure on 4 L oxygen, atrial fibrillation, anemia of chronic disease history was obtained from the patient and [name] (his daughter) at the bedside.  EMR chart was also reviewed.  He presented to the hospital because of increasing shortness of breath, easy fatigability, poor oral intake and constipation.  He has not been feeling well since he had left great toe amputation on 05/03/2022.  Because of easy fatigability and exertional shortness of breath he has mostly been sedentary.  He has not moved his bowels for about 8 days.  He has had a lot of nausea/dry heaving and vomiting mucus.        Assessment/Plan:   Principal Problem:   Community acquired pneumonia Active Problems:   Hypokalemia   Chronic diastolic CHF (congestive heart failure)   COPD (chronic obstructive pulmonary disease)   Chronic respiratory failure with hypoxia   Atrial fibrillation with RVR   Coronary artery disease   Constipation   PVD (peripheral vascular disease)   Elevated troponin    Body mass index is 20.97 kg/m.    Left lower lobe pneumonia: Continue empiric antibiotics (IV ceftriaxone and azithromycin-converted to Levaquin and Flagyl).  Follow-up blood cultures.    Acute on chronic hypoxic respiratory failure: Oxygen therapy was escalated to 9 L/min via high flow nasal cannula.  Taper down oxygen as able.  He uses 4 L/min oxygen at baseline.     Hypokalemia: Improving.  Continue potassium repletion.     Elevated troponins: No chest pain.  This is likely due to demand ischemia from infection.       Constipation: No bowel movement despite using Dulcolax suppository yesterday.  SMOG enema has been ordered. No acute abnormality or bowel obstruction noted on CT abdomen.       Chronic diastolic CHF: Continue torsemide.     COPD with chronic hypoxic respiratory failure: Continue bronchodilators.    Atrial fibrillation with RVR: Continue home metoprolol and Cardizem.  Continue Eliquis.  Replete electrolytes.     Recent left great toe osteomyelitis s/p left great toe amputation on 05/03/2022: Wound culture showed Pseudomonas aeruginosa infection.  He was on ciprofloxacin and Augmentin at home (to be completed on 05/16/2022).     Other comorbidities include paroxysmal atrial fibrillation, CAD, PVD, recent left great toe amputation for left great toe osteomyelitis, anemia of chronic disease     Diet Order             Diet Heart Room service appropriate? Yes; Fluid consistency: Thin  Diet effective now                            Consultants: None  Procedures: None    Medications:    albuterol  2.5 mg Nebulization QID   apixaban  2.5 mg Oral BID   aspirin EC  81 mg Oral Q0600   atorvastatin  40 mg Oral QHS   diltiazem  300 mg Oral Daily   fluticasone furoate-vilanterol  1 puff Inhalation Daily   And   umeclidinium bromide  1 puff Inhalation Daily   levofloxacin  750 mg Oral Q48H   metoprolol tartrate  50 mg Oral BID   metroNIDAZOLE  500 mg Oral Q12H   polyethylene glycol  17 g Oral Daily   predniSONE  5 mg Oral Q breakfast   senna-docusate  2 tablet Oral BID   sorbitol, milk of mag, mineral oil, glycerin (SMOG) enema  960 mL Rectal Once   torsemide  20 mg Oral Once per day on Sun Tue Wed Thu Sat   [START ON 05/13/2022] torsemide  40 mg Oral Once per day on Mon Fri   [START ON 05/12/2022] Vitamin D  (Ergocalciferol)  50,000 Units Oral Q7 days   Continuous Infusions:   Anti-infectives (From admission, onward)    Start     Dose/Rate Route Frequency Ordered Stop   05/11/22 2200  levofloxacin (LEVAQUIN) tablet 750 mg        750 mg Oral Every 48 hours 05/11/22 1034     05/11/22 1130  metroNIDAZOLE (FLAGYL) tablet 500 mg        500 mg Oral Every 12 hours 05/11/22 1034     05/11/22 0800  ciprofloxacin (CIPRO) tablet 500 mg  Status:  Discontinued        500 mg Oral 2 times daily 05/10/22 1828 05/11/22 1034   05/11/22 0600  cefTRIAXone (ROCEPHIN) 2 g in sodium chloride 0.9 % 100 mL IVPB  Status:  Discontinued        2 g 200 mL/hr over 30 Minutes Intravenous Every 24 hours 05/10/22 1820 05/11/22 1034   05/10/22 1830  azithromycin (ZITHROMAX) 500 mg in sodium chloride 0.9 % 250 mL IVPB  Status:  Discontinued        500 mg 250 mL/hr over 60 Minutes Intravenous Every 24 hours 05/10/22 1820 05/11/22 1034   05/10/22 1730  ceFEPIme (MAXIPIME) 1 g in sodium chloride 0.9 % 100 mL IVPB        1 g 200 mL/hr over 30 Minutes Intravenous  Once 05/10/22 1721 05/10/22 1945              Family Communication/Anticipated D/C date and plan/Code Status   DVT prophylaxis: apixaban (ELIQUIS) tablet 2.5 mg Start: 05/10/22 2200 apixaban (ELIQUIS) tablet 2.5 mg     Code Status: DNR  Family Communication: None Disposition Plan: Plan to discharge home in 1 to 2 days   Status is: Inpatient Remains inpatient appropriate because: Antibiotics for pneumonia        Subjective:   Interval events noted.  He has developed his bowels despite taking laxatives yesterday.  Breathing is a little better today.  He has a cough.  No chest pain, vomiting or abdominal pain.  Objective:    Vitals:   05/11/22 0413 05/11/22 0726 05/11/22 0752 05/11/22 1155  BP: (!) 143/74 (!) 140/76  130/66  Pulse: 95 (!) 109  91  Resp: 20 20  20   Temp: 99.4 F (37.4 C) 98.3 F (36.8 C)  97.9 F (36.6 C)  TempSrc:  Oral Oral    SpO2: 94% 94% 93% 99%  Weight:      Height:       No data found.   Intake/Output Summary (Last 24 hours) at 05/11/2022 1207 Last data filed at 05/11/2022 6433 Gross per 24 hour  Intake 1093.7 ml  Output 325 ml  Net 768.7 ml  Filed Weights   05/10/22 1251  Weight: 64.4 kg    Exam:  GEN: NAD SKIN: Warm and dry EYES: No pallor or icterus ENT: MMM CV: RRR PULM: Bilateral rhonchi, no respiratory ABD: soft, ND, NT, +BS CNS: AAO x 3, non focal EXT: No edema or tenderness        Data Reviewed:   I have personally reviewed following labs and imaging studies:  Labs: Labs show the following:   Basic Metabolic Panel: Recent Labs  Lab 05/10/22 1529 05/11/22 0547  NA 139 141  K 2.7* 3.3*  CL 96* 101  CO2 30 29  GLUCOSE 99 85  BUN 22 23  CREATININE 1.02 1.04  CALCIUM 8.5* 8.3*  MG 1.7 2.2   GFR Estimated Creatinine Clearance: 42.1 mL/min (by C-G formula based on SCr of 1.04 mg/dL). Liver Function Tests: Recent Labs  Lab 05/10/22 1529  AST 38  ALT 15  ALKPHOS 66  BILITOT 0.6  PROT 6.8  ALBUMIN 2.9*   No results for input(s): "LIPASE", "AMYLASE" in the last 168 hours. No results for input(s): "AMMONIA" in the last 168 hours. Coagulation profile No results for input(s): "INR", "PROTIME" in the last 168 hours.  CBC: Recent Labs  Lab 05/10/22 1529  WBC 10.5  NEUTROABS 8.1*  HGB 8.8*  HCT 27.7*  MCV 93.9  PLT 295   Cardiac Enzymes: No results for input(s): "CKTOTAL", "CKMB", "CKMBINDEX", "TROPONINI" in the last 168 hours. BNP (last 3 results) No results for input(s): "PROBNP" in the last 8760 hours. CBG: No results for input(s): "GLUCAP" in the last 168 hours. D-Dimer: No results for input(s): "DDIMER" in the last 72 hours. Hgb A1c: No results for input(s): "HGBA1C" in the last 72 hours. Lipid Profile: No results for input(s): "CHOL", "HDL", "LDLCALC", "TRIG", "CHOLHDL", "LDLDIRECT" in the last 72 hours. Thyroid function  studies: No results for input(s): "TSH", "T4TOTAL", "T3FREE", "THYROIDAB" in the last 72 hours.  Invalid input(s): "FREET3" Anemia work up: No results for input(s): "VITAMINB12", "FOLATE", "FERRITIN", "TIBC", "IRON", "RETICCTPCT" in the last 72 hours. Sepsis Labs: Recent Labs  Lab 05/10/22 1529  WBC 10.5  LATICACIDVEN 1.3    Microbiology Recent Results (from the past 240 hour(s))  Aerobic/Anaerobic Culture w Gram Stain (surgical/deep wound)     Status: None   Collection Time: 05/03/22 11:33 AM   Specimen: PATH Bone resection; Tissue  Result Value Ref Range Status   Specimen Description   Final    BONE Performed at [facility], [address] [place], [place] [address]    Special Requests   Final    LT GREAT TOE BONE Performed at [facility], [address] [place], [place] [address]    Gram Stain   Final    ABUNDANT WBC PRESENT, PREDOMINANTLY PMN FEW GRAM NEGATIVE RODS Performed at [facility], [address], [place], [place] [address]    Culture   Final    ABUNDANT PSEUDOMONAS AERUGINOSA Two isolates with different morphologies were identified as the same organism.The most resistant organism was reported. RARE PROPIONIBACTERIUM SPECIES    Report Status 05/09/2022 FINAL  Final   Organism ID, Bacteria PSEUDOMONAS AERUGINOSA  Final      Susceptibility   Pseudomonas aeruginosa - MIC*    CEFTAZIDIME 2 SENSITIVE Sensitive     CIPROFLOXACIN <=0.25 SENSITIVE Sensitive     GENTAMICIN <=1 SENSITIVE Sensitive     IMIPENEM 2 SENSITIVE Sensitive     PIP/TAZO >=128 RESISTANT Resistant  CEFEPIME 2 SENSITIVE Sensitive     * ABUNDANT PSEUDOMONAS AERUGINOSA  Culture, blood (Routine X 2) w Reflex to ID Panel     Status: None (Preliminary result)   Collection Time: 05/10/22  8:45 PM   Specimen: BLOOD  Result Value Ref Range Status   Specimen Description BLOOD BLOOD LEFT HAND  Final   Special Requests   Final    BOTTLES DRAWN AEROBIC AND ANAEROBIC  Blood Culture results may not be optimal due to an inadequate volume of blood received in culture bottles   Culture   Final    NO GROWTH < 12 HOURS Performed at [facility], [address] [place], [place] [address]    Report Status PENDING  Incomplete  Culture, blood (Routine X 2) w Reflex to ID Panel     Status: None (Preliminary result)   Collection Time: 05/10/22  8:45 PM   Specimen: BLOOD  Result Value Ref Range Status   Specimen Description BLOOD RIGHT ANTECUBITAL  Final   Special Requests   Final    BOTTLES DRAWN AEROBIC AND ANAEROBIC Blood Culture adequate volume   Culture   Final    NO GROWTH < 12 HOURS Performed at [facility], [address] [place], [place] [address]    Report Status PENDING  Incomplete    Procedures and diagnostic studies:  CT Angio Chest PE W and/or Wo Contrast  Result Date: 05/10/2022 CLINICAL DATA:  Pleural effusion on chest x-ray EXAM: CT ANGIOGRAPHY CHEST WITH CONTRAST TECHNIQUE: Multidetector CT imaging of the chest was performed using the standard protocol during bolus administration of intravenous contrast. Multiplanar CT image reconstructions and MIPs were obtained to evaluate the vascular anatomy. RADIATION DOSE REDUCTION: This exam was performed according to the departmental dose-optimization program which includes automated exposure control, adjustment of the mA and/or kV according to patient size and/or use of iterative reconstruction technique. CONTRAST:  75mL OMNIPAQUE IOHEXOL 350 MG/ML SOLN COMPARISON:  Chest x-ray 05/10/2022, CT chest 10/08/2019 FINDINGS: Cardiovascular: Satisfactory opacification of the pulmonary arteries to the segmental level. No evidence of pulmonary embolism. Moderate severe aortic atherosclerosis. No aneurysm. Coronary vascular calcifications. Cardiomegaly. No pericardial effusion. Mediastinum/Nodes: Midline trachea. No thyroid mass. No suspicious lymph nodes. Esophagus within normal limits.  Lungs/Pleura: Emphysema. Apical pleural and parenchymal scarring with calcifications, unchanged. Small bilateral pleural effusions. No pneumothorax. Mild airspace disease at the left lung base. Upper Abdomen: No acute finding Musculoskeletal: Chronic superior endplate fractures at T3 and T4. Chronic compression fractures at T5 and T7. Chronic L1 and L2 compression fractures. Old sternal fracture. Review of the MIP images confirms the above findings. IMPRESSION: 1. Negative for acute pulmonary embolus. 2. Emphysema. Mild airspace disease at the left lung base which could be due to atelectasis pneumonia or scarring. 3. Cardiomegaly with small bilateral pleural effusions 4. Multiple chronic compression fractures of the thoracic and lumbar spine. 5. Aortic atherosclerosis. Aortic Atherosclerosis (ICD10-I70.0) and Emphysema (ICD10-J43.9). Electronically Signed   By: Jasmine Pang M.D.   On: 05/10/2022 17:17   CT ABDOMEN PELVIS W CONTRAST  Result Date: 05/10/2022 CLINICAL DATA:  Abdominal pain. EXAM: CT ABDOMEN AND PELVIS WITH CONTRAST TECHNIQUE: Multidetector CT imaging of the abdomen and pelvis was performed using the standard protocol following bolus administration of intravenous contrast. RADIATION DOSE REDUCTION: This exam was performed according to the departmental dose-optimization program which includes automated exposure control, adjustment of the mA and/or kV according to patient size and/or use of iterative reconstruction technique. CONTRAST:  75mL OMNIPAQUE IOHEXOL  350 MG/ML SOLN COMPARISON:  None Available. FINDINGS: Lower chest: Advanced changes of emphysema noted in the lung bases. There is spiculated masslike consolidative opacity in the posterior left lower lobe with small left and tiny right pleural effusions. Hepatobiliary: No suspicious focal abnormality within the liver parenchyma. Mild intrahepatic biliary duct dilatation evident. Gallbladder is nondistended. Common bile duct in the head of the  pancreas measures 8 mm diameter, upper normal for patient age. Pancreas: No focal mass lesion. No dilatation of the main duct. No intraparenchymal cyst. No peripancreatic edema. Spleen: No splenomegaly. No focal mass lesion. Adrenals/Urinary Tract: No adrenal nodule or mass. 3 mm nonobstructing stone identified interpolar right kidney with a 3 mm nonobstructing stone in the lower pole right kidney. No left-sided nephrolithiasis. Small bilateral low-density renal lesions are likely cysts. No followup imaging is recommended. No evidence for hydroureter. The urinary bladder appears normal for the degree of distention. Stomach/Bowel: Stomach is unremarkable. No gastric wall thickening. No evidence of outlet obstruction. Duodenum is normally positioned as is the ligament of Treitz. No small bowel wall thickening. No small bowel dilatation. The terminal ileum is normal. The appendix is normal. No gross colonic mass. No colonic wall thickening. No substantial diverticular disease. Vascular/Lymphatic: Status post aorto bi femoral bypass graft placement. Potential aneurysm/pseudoaneurysm in the right groin at the anastomosis. There is no gastrohepatic or hepatoduodenal ligament lymphadenopathy. No retroperitoneal or mesenteric lymphadenopathy. No pelvic sidewall lymphadenopathy. Reproductive: The prostate gland and seminal vesicles are unremarkable. Other: No intraperitoneal free fluid. Musculoskeletal: Bones are diffusely demineralized. No worrisome lytic or sclerotic osseous abnormality. Compression fractures are seen at all levels from L1 through L5. IMPRESSION: 1. No acute findings in the abdomen or pelvis. 2. Irregular masslike consolidative opacity in the posterior left lower lobe is likely related to pneumonia although close follow-up recommended as neoplasm not excluded. Small left and tiny right pleural effusions. 3. Mild intrahepatic biliary duct dilatation. Common bile duct in the head of the pancreas measures 8  mm diameter, upper normal for patient age. Correlation with liver function test may prove helpful. 4. Nonobstructing right renal stones. 5. Status post aorto bi femoral bypass graft placement. Potential aneurysm/pseudoaneurysm in the right groin at the anastomosis. 6. Compression fracture involving all lumbar levels from L1-L5, stable since MRI 10/09/2019. Electronically Signed   By: Kennith Center M.D.   On: 05/10/2022 17:10   DG Chest Portable 1 View  Result Date: 05/10/2022 CLINICAL DATA:  Cough.  Some shortness of breath EXAM: PORTABLE CHEST 1 VIEW COMPARISON:  03/27/2022 and older studies.  CT, 10/08/2019. FINDINGS: Cardiac silhouette is normal in size. No mediastinal or hilar masses. Bilateral interstitial thickening. Areas lung scarring most evident at the apices and bases. Blunting the lateral left costophrenic sulcus is consistent with a small effusion. No convincing pneumonia. No pneumothorax. Skeletal structures are grossly intact. IMPRESSION: 1. Predominantly chronic findings in the lungs including interstitial thickening and scarring. A component interstitial edema should be considered in the proper clinical setting. Small left effusion. No convincing pneumonia. Electronically Signed   By: Amie Portland M.D.   On: 05/10/2022 15:48   DG Abdomen 1 View  Result Date: 05/10/2022 CLINICAL DATA:  Constipation. EXAM: ABDOMEN - 1 VIEW COMPARISON:  None Available. FINDINGS: Moderate fecal material throughout the colon. No evidence of small-bowel obstruction or gross signs of free intraperitoneal air. No abnormal calcifications. Visualized bony structures are grossly unremarkable. IMPRESSION: Moderate fecal material throughout the colon. Electronically Signed   By: Rudene Anda.D.  On: 05/10/2022 13:30               LOS: 0 days   Thressa Shiffer  [facility]   Pager on [email or web address]. If 7PM-7AM, please contact night-coverage at [email or web address]     05/11/2022, 12:07 PM

## 2022-05-12 ENCOUNTER — Inpatient Hospital Stay: Payer: Medicare Other

## 2022-05-12 DIAGNOSIS — I5033 Acute on chronic diastolic (congestive) heart failure: Secondary | ICD-10-CM | POA: Diagnosis not present

## 2022-05-12 DIAGNOSIS — I4891 Unspecified atrial fibrillation: Secondary | ICD-10-CM | POA: Diagnosis not present

## 2022-05-12 DIAGNOSIS — J189 Pneumonia, unspecified organism: Secondary | ICD-10-CM | POA: Diagnosis not present

## 2022-05-12 LAB — BLOOD GAS, VENOUS
Acid-Base Excess: 11.5 mmol/L — ABNORMAL HIGH (ref 0.0–2.0)
Bicarbonate: 36.6 mmol/L — ABNORMAL HIGH (ref 20.0–28.0)
O2 Content: 6 L/min
O2 Saturation: 78.6 %
Patient temperature: 37
pCO2, Ven: 48 mmHg (ref 44–60)
pH, Ven: 7.49 — ABNORMAL HIGH (ref 7.25–7.43)
pO2, Ven: 52 mmHg — ABNORMAL HIGH (ref 32–45)

## 2022-05-12 LAB — CULTURE, BLOOD (ROUTINE X 2)

## 2022-05-12 LAB — GLUCOSE, CAPILLARY: Glucose-Capillary: 142 mg/dL — ABNORMAL HIGH (ref 70–99)

## 2022-05-12 MED ORDER — LEVALBUTEROL HCL 0.63 MG/3ML IN NEBU
0.6300 mg | INHALATION_SOLUTION | Freq: Four times a day (QID) | RESPIRATORY_TRACT | Status: DC | PRN
  Administered 2022-05-12 – 2022-05-14 (×2): 0.63 mg via RESPIRATORY_TRACT
  Filled 2022-05-12 (×2): qty 3

## 2022-05-12 MED ORDER — POTASSIUM CHLORIDE CRYS ER 20 MEQ PO TBCR
40.0000 meq | EXTENDED_RELEASE_TABLET | Freq: Once | ORAL | Status: AC
  Administered 2022-05-12: 40 meq via ORAL
  Filled 2022-05-12: qty 2

## 2022-05-12 MED ORDER — ADULT MULTIVITAMIN W/MINERALS CH
1.0000 | ORAL_TABLET | Freq: Every day | ORAL | Status: DC
  Administered 2022-05-12 – 2022-05-20 (×9): 1 via ORAL
  Filled 2022-05-12 (×10): qty 1

## 2022-05-12 MED ORDER — ENSURE ENLIVE PO LIQD
237.0000 mL | Freq: Two times a day (BID) | ORAL | Status: DC
  Administered 2022-05-12 – 2022-05-21 (×12): 237 mL via ORAL

## 2022-05-12 MED ORDER — FUROSEMIDE 10 MG/ML IJ SOLN
40.0000 mg | Freq: Once | INTRAMUSCULAR | Status: AC
  Administered 2022-05-12: 40 mg via INTRAVENOUS
  Filled 2022-05-12: qty 4

## 2022-05-12 MED ORDER — HYDROXYZINE HCL 10 MG PO TABS
10.0000 mg | ORAL_TABLET | Freq: Three times a day (TID) | ORAL | Status: DC | PRN
  Administered 2022-05-17 – 2022-05-21 (×4): 10 mg via ORAL
  Filled 2022-05-12 (×5): qty 1

## 2022-05-12 NOTE — Evaluation (Signed)
Physical Therapy Evaluation Patient Details Name: [name] MRN: [id number] DOB: [birth year] Today's Date: 05/12/2022  History of Present Illness  Pt is a 89 y/o M admitted on 05/10/22 after presenting with c/o SOB, easy fatigability, poor oral intake & constipation. Pt is being treated for LLL PNA & acute on chronic respiratory failure. PMH: L great toe amputation 2/2 osteomyelitis, chronic diastolic CHF, PVD, CAD, R carotid artery stenosis, aortic stenosis, R BBB, aspiration PNA, COPD, chronic hypoxic respiratory failure on 4L O2, a-fib, anemia  Clinical Impression  Pt seen for PT evaluation with pt agreeable to tx. Pt reports prior to admission he was ambulatory with [facility] but has required use of RW lately 2/2 ongoing weakness. On this date, pt is able to complete bed mobility with supervision, STS with min assist, & step pivot with RW & CGA. Pt incontinent of BM throughout session & requires total assist for peri hygiene. Pt notes 8/10 fatigue after transfer to recliner. Pt would benefit from ongoing PT services to address strengthening, endurance, balance & gait with LRAD.  Pt on 5L/min via nasal cannula, attempted to obtain SpO2 but difficulty 2/2 cold fingers. Pt with increased RR after transfer to recliner but able to slow breathing down with seated rest.       Recommendations for follow up therapy are one component of a multi-disciplinary discharge planning process, led by the attending physician.  Recommendations may be updated based on patient status, additional functional criteria and insurance authorization.  Follow Up Recommendations Can patient physically be transported by private vehicle: Yes     Assistance Recommended at Discharge Intermittent Supervision/Assistance  Patient can return home with the following  A little help with walking and/or transfers;A little help with bathing/dressing/bathroom;Assistance with cooking/housework;Assist for transportation;Help with stairs or  ramp for entrance    Equipment Recommendations None recommended by PT  Recommendations for Other Services       Functional Status Assessment Patient has had a recent decline in their functional status and demonstrates the ability to make significant improvements in function in a reasonable and predictable amount of time.     Precautions / Restrictions Precautions Precautions: Fall Restrictions Weight Bearing Restrictions: Yes LLE Weight Bearing:  (heel weight bearing in post op shoe (per podiatry))      Mobility  Bed Mobility Overal bed mobility: Needs Assistance Bed Mobility: Supine to Sit     Supine to sit: Supervision, HOB elevated          Transfers Overall transfer level: Needs assistance Equipment used: Rolling walker (2 wheels) Transfers: Sit to/from Stand Sit to Stand: Min assist           General transfer comment: STS from EOB & recliner with min assist, cuing for hand placement during STS with RW. Step pivot bed>recliner with RW & CGA.    Ambulation/Gait                  Stairs            Wheelchair Mobility    Modified Rankin (Stroke Patients Only)       Balance Overall balance assessment: Needs assistance Sitting-balance support: Feet supported, Bilateral upper extremity supported Sitting balance-Leahy Scale: Fair     Standing balance support: Bilateral upper extremity supported, During functional activity, Reliant on assistive device for balance Standing balance-Leahy Scale: Fair  Pertinent Vitals/Pain Pain Assessment Pain Assessment: No/denies pain    Home Living Family/patient expects to be discharged to:: Private residence Living Arrangements: Children Available Help at Discharge: Family;Available 24 hours/day Type of Home: House Home Access: Ramped entrance       Home Layout: One level Home Equipment: Agricultural consultant (2 wheels);BSC/3in1;Cane - single point Additional  Comments: Lives with dtr with 24/7 supervision available    Prior Function Prior Level of Function : Independent/Modified Independent             Mobility Comments: Pt ambulatory with [facility]PC but recently started using RW 2/2 ongoing weakness.       Hand Dominance        Extremity/Trunk Assessment   Upper Extremity Assessment Upper Extremity Assessment: Generalized weakness (bruising on BUE)    Lower Extremity Assessment Lower Extremity Assessment: Generalized weakness    Cervical / Trunk Assessment Cervical / Trunk Assessment:  (\)  Communication   Communication: No difficulties  Cognition Arousal/Alertness: Awake/alert Behavior During Therapy: WFL for tasks assessed/performed Overall Cognitive Status: Within Functional Limits for tasks assessed                                 General Comments: Incontinent of BM & pt unaware, pt reports this is new.        General Comments      Exercises     Assessment/Plan    PT Assessment Patient needs continued PT services  PT Problem List Decreased strength;Cardiopulmonary status limiting activity;Decreased range of motion;Decreased activity tolerance;Decreased balance;Decreased mobility;Decreased skin integrity;Decreased safety awareness;Decreased knowledge of use of DME       PT Treatment Interventions DME instruction;Therapeutic exercise;Gait training;Balance training;Stair training;Functional mobility training;Therapeutic activities;Patient/family education;Neuromuscular re-education;Modalities    PT Goals (Current goals can be found in the Care Plan section)  Acute Rehab PT Goals Patient Stated Goal: get better PT Goal Formulation: With patient Time For Goal Achievement: 05/26/22 Potential to Achieve Goals: Fair    Frequency Min 3X/week     Co-evaluation               AM-PAC PT "6 Clicks" Mobility  Outcome Measure Help needed turning from your back to your side while in a flat bed without  using bedrails?: None Help needed moving from lying on your back to sitting on the side of a flat bed without using bedrails?: A Little Help needed moving to and from a bed to a chair (including a wheelchair)?: A Little Help needed standing up from a chair using your arms (e.g., wheelchair or bedside chair)?: A Little Help needed to walk in hospital room?: A Little Help needed climbing 3-5 steps with a railing? : A Lot 6 Click Score: 18    End of Session Equipment Utilized During Treatment: Oxygen Activity Tolerance: Patient tolerated treatment well Patient left:  (in handoff to OT, pt on BSC)   PT Visit Diagnosis: Muscle weakness (generalized) (M62.81);Unsteadiness on feet (R26.81);Difficulty in walking, not elsewhere classified (R26.2)    Time: 6213-0865 PT Time Calculation (min) (ACUTE ONLY): 31 min   Charges:   PT Evaluation $PT Eval Low Complexity: 1 Low PT Treatments $Therapeutic Activity: 8-22 mins        Aleda Grana, PT, DPT 05/12/22, 1:00 PM   Sandi Mariscal 05/12/2022, 12:59 PM

## 2022-05-12 NOTE — Progress Notes (Signed)
   05/12/22 0131  Assess: MEWS Score  Temp 98.7 F (37.1 C)  BP 112/76  Pulse Rate (!) 117  Resp 18  SpO2 100 %  O2 Device HFNC  O2 Flow Rate (L/min) 6 L/min  Assess: MEWS Score  MEWS Temp 0  MEWS Systolic 0  MEWS Pulse 2  MEWS RR 0  MEWS LOC 0  MEWS Score 2  MEWS Score Color Yellow  Assess: if the MEWS score is Yellow or Red  Were vital signs taken at a resting state? Yes  Focused Assessment Change from prior assessment (see assessment flowsheet)  Does the patient meet 2 or more of the SIRS criteria? No  MEWS guidelines implemented  No, vital signs rechecked  Notify: Charge Nurse/RN  Name of Charge Nurse/RN Notified [place], RN  Provider Notification  Provider Name/Title Manuela Schwartz, NP  Date Provider Notified 05/12/22  Time Provider Notified 0144  Method of Notification  (secure chat)  Notification Reason Change in status  Provider response Other (Comment);See new orders (secure chat)  Date of Provider Response 05/12/22  Assess: SIRS CRITERIA  SIRS Temperature  0  SIRS Pulse 1  SIRS Respirations  0  SIRS WBC 0  SIRS Score Sum  1   Got a breathing tx ordered that doesn't elevate HR - Xopenex and venous blood gas ordered.  Pt improved after breathing tx.  Glucose was WNL despite very poor intake.  Put in a dietary consult.  Daughter informed and she came to the floor.

## 2022-05-12 NOTE — Progress Notes (Signed)
Initial Nutrition Assessment RD working remotely.   DOCUMENTATION CODES:   Not applicable  INTERVENTION:  - ordered Ensure Plus High Protein BID, each supplement provides 350 kcal and 20 grams of protein.  - ordered 1 tablet multivitamin with minerals/day.  - liberalized diet from Heart Healthy, thin liquids to Regular, thin liquids.  - complete NFPE when feasible.    NUTRITION DIAGNOSIS:   Increased nutrient needs related to acute illness, nausea, vomiting as evidenced by estimated needs.  GOAL:   Patient will meet greater than or equal to 90% of their needs  MONITOR:   PO intake, Supplement acceptance, Labs, Weight trends  REASON FOR ASSESSMENT:   Malnutrition Screening Tool, Consult Poor PO  ASSESSMENT:   89 y.o. male with medical history of recent L great toe amputation d/t osteomyelitis, chronic diastolic CHF, PVD, CAD, HTN, HLD, PVD, R carotid artery stenosis, aortic stenosis, R bundle branch block, hx of COVID-19 infection, hx of aspiration PNA, COPD, chronic hypoxic respiratory failure on 4 L O2, afib, anemia of chronic disease, skin cancer (basal cell carcinoma), and stage 3 CKD. He presented to the ED due to increased shortness of breath, fatigue, poor oral intake, constipation (x8 days), and nausea with dry heaving and vomiting mucus. He has been feeling worse since L great toe amputation on 05/03/22.  Patient has not been assessed by a [place] RD since 08/10/20. Per flow sheet documentation, patient ate 60% of lunch on 4/13. He was experiencing constipation x8 days PTA. Yesterday patient had two type 6 BMs and earlier today had a large type 7.   Weight on 4/12 was 142 lb and weight has been stable since 11/15/21 when he weighed 145 lb. No information documented in the edema section of flow sheet.  Per notes: - LLL PNA - acute on chronic hypoxic respiratory failure - hypokalemia -- improving - elevated troponins -- thought to be demand ischemia from  infection - constipation -- SMOG enema done, no abnormality or obstruction on CT abdomen - afib with RVR   Labs reviewed; CBG: 142 mg/dl, K: 3.3 mmol/l, Ca: 8.3 mg/dl.  Medications reviewed; 5 ml mycostatin QID, 17 g miralax/day, 40 mEq Klor-Con x1 dose 4/14, 5 mg deltasone/day, 2 tablets senokot BID, 81017 units ergocalciferol every 7 days starting 4/14.    NUTRITION - FOCUSED PHYSICAL EXAM:  RD working remotely.  Diet Order:   Diet Order             Diet regular Room service appropriate? Yes; Fluid consistency: Thin  Diet effective now                   EDUCATION NEEDS:   No education needs have been identified at this time  Skin:  Skin Assessment: Reviewed RN Assessment  Last BM:  4/13 (type 6 x2, one medium and one large) and 4/14 (type 7 x1, large)  Height:   Ht Readings from Last 1 Encounters:  05/10/22 5\' 9"  (1.753 m)    Weight:   Wt Readings from Last 1 Encounters:  05/10/22 64.4 kg    BMI:  Body mass index is 20.97 kg/m.  Estimated Nutritional Needs:  Kcal:  1625-1850 kcal Protein:  75-90 grams Fluid:  >/= 1.8 L/day     Trenton Gammon, MS, RD, LDN, CNSC Clinical Dietitian PRN/Relief staff On-call/weekend pager # available in [facility]

## 2022-05-12 NOTE — Progress Notes (Addendum)
Progress Note    [name]  ZOX:[id number] DOB: 1928-12-17  DOA: 05/10/2022 PCP: Gracelyn Nurse, MD      Brief Narrative:    Medical records reviewed and are as summarized below:  [name] is a 89 y.o. male multiple medical problems including but not limited to recent left great toe amputation for osteomyelitis left great toe, chronic diastolic CHF, PVD, CAD, right carotid artery stenosis, aortic stenosis, right bundle branch block, history of COVID-19 infection, history of aspiration pneumonia, COPD, chronic hypoxic respiratory failure on 4 L oxygen, atrial fibrillation, anemia of chronic disease history was obtained from the patient and [name] (his daughter) at the bedside.  EMR chart was also reviewed.  He presented to the hospital because of increasing shortness of breath, easy fatigability, poor oral intake and constipation.  He has not been feeling well since he had left great toe amputation on 05/03/2022.  Because of easy fatigability and exertional shortness of breath he has mostly been sedentary.  He has not moved his bowels for about 8 days.  He has had a lot of nausea/dry heaving and vomiting mucus.        Assessment/Plan:   Principal Problem:   Community acquired pneumonia Active Problems:   Hypokalemia   Chronic diastolic CHF (congestive heart failure)   COPD (chronic obstructive pulmonary disease)   Chronic respiratory failure with hypoxia   Atrial fibrillation with RVR   Coronary artery disease   Constipation   PVD (peripheral vascular disease)   Elevated troponin    Body mass index is 20.97 kg/m.    Left lower lobe pneumonia: No growth on blood cultures thus far.  Continue Levaquin and Flagyl.  Strep pneumo urine antigen was negative.  Legionella urine antigen is pending.      Acute on chronic hypoxic respiratory failure: Oxygen therapy has been weaned down from 9 L/min to 5 L/min oxygen.  He uses 4 L/min oxygen at baseline.       Hypokalemia: Improving.  Continue potassium repletion.     Elevated troponins: No chest pain.  This is likely due to demand ischemia from infection.       Constipation: Improved.  He has moved his bowels. No acute abnormality or bowel obstruction noted on CT abdomen.       Acute on chronic diastolic CHF: Chest ray done on 05/12/2022 showed increased bilateral pulmonary edema with right-sided pleural effusions.  Treat with IV Lasix.     COPD with chronic hypoxic respiratory failure: Continue low-dose prednisone and bronchodilators.    Atrial fibrillation with RVR: Continue home metoprolol and Cardizem.  Continue Eliquis.  Replete electrolytes.     Recent left great toe osteomyelitis s/p left great toe amputation on 05/03/2022: Wound culture showed Pseudomonas aeruginosa infection.  He was on ciprofloxacin and Augmentin at home (to be completed on 05/16/2022).   Recent oral thrush prior to admission: Nystatin suspension has been reordered.     Other comorbidities include paroxysmal atrial fibrillation, CAD, PVD, recent left great toe amputation for left great toe osteomyelitis, anemia of chronic disease    Advanced age and frailty.  Prognosis is guarded.  Palliative care team has been consulted  Plan discussed with his daughter at the bedside.  Diet Order             Diet regular Room service appropriate? Yes; Fluid consistency: Thin  Diet effective now  Consultants: None  Procedures: None    Medications:    albuterol  2.5 mg Nebulization QID   apixaban  2.5 mg Oral BID   aspirin EC  81 mg Oral Q0600   atorvastatin  40 mg Oral QHS   diltiazem  300 mg Oral Daily   feeding supplement  237 mL Oral BID BM   fluticasone furoate-vilanterol  1 puff Inhalation Daily   And   umeclidinium bromide  1 puff Inhalation Daily   levofloxacin  750 mg Oral Q48H   metoprolol tartrate  50 mg Oral BID   metroNIDAZOLE  500 mg Oral Q12H    multivitamin with minerals  1 tablet Oral Daily   nystatin  5 mL Mouth/Throat QID   polyethylene glycol  17 g Oral Daily   predniSONE  5 mg Oral Q breakfast   senna-docusate  2 tablet Oral BID   torsemide  20 mg Oral Once per day on Sun Tue Wed Thu Sat   [START ON 05/13/2022] torsemide  40 mg Oral Once per day on Mon Fri   Vitamin D (Ergocalciferol)  50,000 Units Oral Q7 days   Continuous Infusions:   Anti-infectives (From admission, onward)    Start     Dose/Rate Route Frequency Ordered Stop   05/11/22 2200  levofloxacin (LEVAQUIN) tablet 750 mg        750 mg Oral Every 48 hours 05/11/22 1034     05/11/22 1130  metroNIDAZOLE (FLAGYL) tablet 500 mg        500 mg Oral Every 12 hours 05/11/22 1034     05/11/22 0800  ciprofloxacin (CIPRO) tablet 500 mg  Status:  Discontinued        500 mg Oral 2 times daily 05/10/22 1828 05/11/22 1034   05/11/22 0600  cefTRIAXone (ROCEPHIN) 2 g in sodium chloride 0.9 % 100 mL IVPB  Status:  Discontinued        2 g 200 mL/hr over 30 Minutes Intravenous Every 24 hours 05/10/22 1820 05/11/22 1034   05/10/22 1830  azithromycin (ZITHROMAX) 500 mg in sodium chloride 0.9 % 250 mL IVPB  Status:  Discontinued        500 mg 250 mL/hr over 60 Minutes Intravenous Every 24 hours 05/10/22 1820 05/11/22 1034   05/10/22 1730  ceFEPIme (MAXIPIME) 1 g in sodium chloride 0.9 % 100 mL IVPB        1 g 200 mL/hr over 30 Minutes Intravenous  Once 05/10/22 1721 05/10/22 1945              Family Communication/Anticipated D/C date and plan/Code Status   DVT prophylaxis: apixaban (ELIQUIS) tablet 2.5 mg Start: 05/10/22 2200 apixaban (ELIQUIS) tablet 2.5 mg     Code Status: DNR  Family Communication: Plan discharged with his daughter at the bedside. Disposition Plan: Plan to discharge home in 1 to 2 days   Status is: Inpatient Remains inpatient appropriate because: Antibiotics for pneumonia        Subjective:   Interval events noted.  He complains of  cough and shortness of breath.  He moved his bowels yesterday after enema.  His daughter was at the bedside.  Objective:    Vitals:   05/12/22 0702 05/12/22 0724 05/12/22 1127 05/12/22 1145  BP:  122/73  (!) 102/47  Pulse:  98  100  Resp:  (!) 22  18  Temp:  98.6 F (37 C)    TempSrc:      SpO2: 93% 96% 95% 92%  Weight:      Height:       No data found.   Intake/Output Summary (Last 24 hours) at 05/12/2022 1424 Last data filed at 05/12/2022 1417 Gross per 24 hour  Intake 240 ml  Output --  Net 240 ml   Filed Weights   05/10/22 1251  Weight: 64.4 kg    Exam:   GEN: NAD SKIN: No rash EYES: EOMI ENT: MMM, no oral thrush CV: Irregular rate and rhythm, tachycardic PULM: Decreased air entry bilaterally, bilateral rhonchi.  No rales heard ABD: soft, ND, NT, +BS CNS: AAO x 3, non focal EXT: No edema or tenderness      Data Reviewed:   I have personally reviewed following labs and imaging studies:  Labs: Labs show the following:   Basic Metabolic Panel: Recent Labs  Lab 05/10/22 1529 05/11/22 0547  NA 139 141  K 2.7* 3.3*  CL 96* 101  CO2 30 29  GLUCOSE 99 85  BUN 22 23  CREATININE 1.02 1.04  CALCIUM 8.5* 8.3*  MG 1.7 2.2   GFR Estimated Creatinine Clearance: 42.1 mL/min (by C-G formula based on SCr of 1.04 mg/dL). Liver Function Tests: Recent Labs  Lab 05/10/22 1529  AST 38  ALT 15  ALKPHOS 66  BILITOT 0.6  PROT 6.8  ALBUMIN 2.9*   No results for input(s): "LIPASE", "AMYLASE" in the last 168 hours. No results for input(s): "AMMONIA" in the last 168 hours. Coagulation profile No results for input(s): "INR", "PROTIME" in the last 168 hours.  CBC: Recent Labs  Lab 05/10/22 1529  WBC 10.5  NEUTROABS 8.1*  HGB 8.8*  HCT 27.7*  MCV 93.9  PLT 295   Cardiac Enzymes: No results for input(s): "CKTOTAL", "CKMB", "CKMBINDEX", "TROPONINI" in the last 168 hours. BNP (last 3 results) No results for input(s): "PROBNP" in the last 8760  hours. CBG: Recent Labs  Lab 05/12/22 0144  GLUCAP 142*   D-Dimer: No results for input(s): "DDIMER" in the last 72 hours. Hgb A1c: No results for input(s): "HGBA1C" in the last 72 hours. Lipid Profile: No results for input(s): "CHOL", "HDL", "LDLCALC", "TRIG", "CHOLHDL", "LDLDIRECT" in the last 72 hours. Thyroid function studies: No results for input(s): "TSH", "T4TOTAL", "T3FREE", "THYROIDAB" in the last 72 hours.  Invalid input(s): "FREET3" Anemia work up: No results for input(s): "VITAMINB12", "FOLATE", "FERRITIN", "TIBC", "IRON", "RETICCTPCT" in the last 72 hours. Sepsis Labs: Recent Labs  Lab 05/10/22 1529  WBC 10.5  LATICACIDVEN 1.3    Microbiology Recent Results (from the past 240 hour(s))  Aerobic/Anaerobic Culture w Gram Stain (surgical/deep wound)     Status: None   Collection Time: 05/03/22 11:33 AM   Specimen: PATH Bone resection; Tissue  Result Value Ref Range Status   Specimen Description   Final    BONE Performed at [facility], [address] [place], [place] [address]    Special Requests   Final    LT GREAT TOE BONE Performed at [facility], [address] [place], [place] [address]    Gram Stain   Final    ABUNDANT WBC PRESENT, PREDOMINANTLY PMN FEW GRAM NEGATIVE RODS Performed at [facility], [address], [place], [place] [address]    Culture   Final    ABUNDANT PSEUDOMONAS AERUGINOSA Two isolates with different morphologies were identified as the same organism.The most resistant organism was reported. RARE PROPIONIBACTERIUM SPECIES    Report Status 05/09/2022 FINAL  Final   Organism ID, Bacteria PSEUDOMONAS  AERUGINOSA  Final      Susceptibility   Pseudomonas aeruginosa - MIC*    CEFTAZIDIME 2 SENSITIVE Sensitive     CIPROFLOXACIN <=0.25 SENSITIVE Sensitive     GENTAMICIN <=1 SENSITIVE Sensitive     IMIPENEM 2 SENSITIVE Sensitive     PIP/TAZO >=128 RESISTANT Resistant     CEFEPIME 2 SENSITIVE Sensitive      * ABUNDANT PSEUDOMONAS AERUGINOSA  Culture, blood (Routine X 2) w Reflex to ID Panel     Status: None (Preliminary result)   Collection Time: 05/10/22  8:45 PM   Specimen: BLOOD  Result Value Ref Range Status   Specimen Description BLOOD BLOOD LEFT HAND  Final   Special Requests   Final    BOTTLES DRAWN AEROBIC AND ANAEROBIC Blood Culture results may not be optimal due to an inadequate volume of blood received in culture bottles   Culture   Final    NO GROWTH 2 DAYS Performed at [facility], [address], [place], [place] [address]    Report Status PENDING  Incomplete  Culture, blood (Routine X 2) w Reflex to ID Panel     Status: None (Preliminary result)   Collection Time: 05/10/22  8:45 PM   Specimen: BLOOD  Result Value Ref Range Status   Specimen Description BLOOD RIGHT ANTECUBITAL  Final   Special Requests   Final    BOTTLES DRAWN AEROBIC AND ANAEROBIC Blood Culture adequate volume   Culture   Final    NO GROWTH 2 DAYS Performed at [facility], [address], [place], [place] [address]    Report Status PENDING  Incomplete    Procedures and diagnostic studies:  DG Chest Port 1 View  Result Date: 05/12/2022 CLINICAL DATA:  Acute respiratory failure with hypoxia. EXAM: PORTABLE CHEST 1 VIEW COMPARISON:  May 10, 2022. FINDINGS: Stable cardiomediastinal silhouette. Mildly increased diffuse interstitial densities are noted concerning for pulmonary edema. Bilateral pleural effusions are noted. Bony thorax is unremarkable. IMPRESSION: Increased bilateral pulmonary edema with associated pleural effusions. Electronically Signed   By: Lupita Raider M.D.   On: 05/12/2022 10:00   CT Angio Chest PE W and/or Wo Contrast  Result Date: 05/10/2022 CLINICAL DATA:  Pleural effusion on chest x-ray EXAM: CT ANGIOGRAPHY CHEST WITH CONTRAST TECHNIQUE: Multidetector CT imaging of the chest was performed using the standard protocol during bolus administration of  intravenous contrast. Multiplanar CT image reconstructions and MIPs were obtained to evaluate the vascular anatomy. RADIATION DOSE REDUCTION: This exam was performed according to the departmental dose-optimization program which includes automated exposure control, adjustment of the mA and/or kV according to patient size and/or use of iterative reconstruction technique. CONTRAST:  75mL OMNIPAQUE IOHEXOL 350 MG/ML SOLN COMPARISON:  Chest x-ray 05/10/2022, CT chest 10/08/2019 FINDINGS: Cardiovascular: Satisfactory opacification of the pulmonary arteries to the segmental level. No evidence of pulmonary embolism. Moderate severe aortic atherosclerosis. No aneurysm. Coronary vascular calcifications. Cardiomegaly. No pericardial effusion. Mediastinum/Nodes: Midline trachea. No thyroid mass. No suspicious lymph nodes. Esophagus within normal limits. Lungs/Pleura: Emphysema. Apical pleural and parenchymal scarring with calcifications, unchanged. Small bilateral pleural effusions. No pneumothorax. Mild airspace disease at the left lung base. Upper Abdomen: No acute finding Musculoskeletal: Chronic superior endplate fractures at T3 and T4. Chronic compression fractures at T5 and T7. Chronic L1 and L2 compression fractures. Old sternal fracture. Review of the MIP images confirms the above findings. IMPRESSION: 1. Negative for acute pulmonary embolus. 2. Emphysema. Mild airspace disease at the left lung base  which could be due to atelectasis pneumonia or scarring. 3. Cardiomegaly with small bilateral pleural effusions 4. Multiple chronic compression fractures of the thoracic and lumbar spine. 5. Aortic atherosclerosis. Aortic Atherosclerosis (ICD10-I70.0) and Emphysema (ICD10-J43.9). Electronically Signed   By: Jasmine Pang M.D.   On: 05/10/2022 17:17   CT ABDOMEN PELVIS W CONTRAST  Result Date: 05/10/2022 CLINICAL DATA:  Abdominal pain. EXAM: CT ABDOMEN AND PELVIS WITH CONTRAST TECHNIQUE: Multidetector CT imaging of the  abdomen and pelvis was performed using the standard protocol following bolus administration of intravenous contrast. RADIATION DOSE REDUCTION: This exam was performed according to the departmental dose-optimization program which includes automated exposure control, adjustment of the mA and/or kV according to patient size and/or use of iterative reconstruction technique. CONTRAST:  75mL OMNIPAQUE IOHEXOL 350 MG/ML SOLN COMPARISON:  None Available. FINDINGS: Lower chest: Advanced changes of emphysema noted in the lung bases. There is spiculated masslike consolidative opacity in the posterior left lower lobe with small left and tiny right pleural effusions. Hepatobiliary: No suspicious focal abnormality within the liver parenchyma. Mild intrahepatic biliary duct dilatation evident. Gallbladder is nondistended. Common bile duct in the head of the pancreas measures 8 mm diameter, upper normal for patient age. Pancreas: No focal mass lesion. No dilatation of the main duct. No intraparenchymal cyst. No peripancreatic edema. Spleen: No splenomegaly. No focal mass lesion. Adrenals/Urinary Tract: No adrenal nodule or mass. 3 mm nonobstructing stone identified interpolar right kidney with a 3 mm nonobstructing stone in the lower pole right kidney. No left-sided nephrolithiasis. Small bilateral low-density renal lesions are likely cysts. No followup imaging is recommended. No evidence for hydroureter. The urinary bladder appears normal for the degree of distention. Stomach/Bowel: Stomach is unremarkable. No gastric wall thickening. No evidence of outlet obstruction. Duodenum is normally positioned as is the ligament of Treitz. No small bowel wall thickening. No small bowel dilatation. The terminal ileum is normal. The appendix is normal. No gross colonic mass. No colonic wall thickening. No substantial diverticular disease. Vascular/Lymphatic: Status post aorto bi femoral bypass graft placement. Potential  aneurysm/pseudoaneurysm in the right groin at the anastomosis. There is no gastrohepatic or hepatoduodenal ligament lymphadenopathy. No retroperitoneal or mesenteric lymphadenopathy. No pelvic sidewall lymphadenopathy. Reproductive: The prostate gland and seminal vesicles are unremarkable. Other: No intraperitoneal free fluid. Musculoskeletal: Bones are diffusely demineralized. No worrisome lytic or sclerotic osseous abnormality. Compression fractures are seen at all levels from L1 through L5. IMPRESSION: 1. No acute findings in the abdomen or pelvis. 2. Irregular masslike consolidative opacity in the posterior left lower lobe is likely related to pneumonia although close follow-up recommended as neoplasm not excluded. Small left and tiny right pleural effusions. 3. Mild intrahepatic biliary duct dilatation. Common bile duct in the head of the pancreas measures 8 mm diameter, upper normal for patient age. Correlation with liver function test may prove helpful. 4. Nonobstructing right renal stones. 5. Status post aorto bi femoral bypass graft placement. Potential aneurysm/pseudoaneurysm in the right groin at the anastomosis. 6. Compression fracture involving all lumbar levels from L1-L5, stable since MRI 10/09/2019. Electronically Signed   By: Kennith Center M.D.   On: 05/10/2022 17:10   DG Chest Portable 1 View  Result Date: 05/10/2022 CLINICAL DATA:  Cough.  Some shortness of breath EXAM: PORTABLE CHEST 1 VIEW COMPARISON:  03/27/2022 and older studies.  CT, 10/08/2019. FINDINGS: Cardiac silhouette is normal in size. No mediastinal or hilar masses. Bilateral interstitial thickening. Areas lung scarring most evident at the apices and bases. Blunting the  lateral left costophrenic sulcus is consistent with a small effusion. No convincing pneumonia. No pneumothorax. Skeletal structures are grossly intact. IMPRESSION: 1. Predominantly chronic findings in the lungs including interstitial thickening and scarring. A  component interstitial edema should be considered in the proper clinical setting. Small left effusion. No convincing pneumonia. Electronically Signed   By: Amie Portland M.D.   On: 05/10/2022 15:48               LOS: 1 day   Edy Mcbane  [facility]   Pager on [email or web address]. If 7PM-7AM, please contact night-coverage at [email or web address]     05/12/2022, 2:24 PM

## 2022-05-12 NOTE — Evaluation (Signed)
Occupational Therapy Evaluation Patient Details Name: [name] MRN: [id number] DOB: 12-Jul-1928 Today's Date: 05/12/2022   History of Present Illness Pt is a 89 y/o M admitted on 05/10/22 after presenting with c/o SOB, easy fatigability, poor oral intake & constipation. Pt is being treated for LLL PNA & acute on chronic respiratory failure. PMH: L great toe amputation 2/2 osteomyelitis, chronic diastolic CHF, PVD, CAD, R carotid artery stenosis, aortic stenosis, R BBB, aspiration PNA, COPD, chronic hypoxic respiratory failure on 4L O2, a-fib, anemia   Clinical Impression   Patient agreeable to OT evaluation. Pt presenting with decreased independence in self care, balance, functional mobility/transfers, and endurance. PTA pt lived with daughter who provides assistance with ADLs/IADLs as needed. Pt received in recliner this date and was actively having a loose BM. He required Min A to stand from recliner and then completed step pivot transfer to [facility] with CGA using a RW. Pt required Min A to stand from [facility] and Max A for posterior hygiene 2/2 fatigue. Pt requesting more time to void on [facility] and was left with all needs in reach. NT notified. Pt on 5L O2 via [place] t/o session. Pt will benefit from skilled acute OT services to address deficits noted below. OT recommends ongoing therapy upon discharge to maximize safety and independence with ADLs, decrease fall risk, decrease caregiver burden, and promote return to PLOF.     Recommendations for follow up therapy are one component of a multi-disciplinary discharge planning process, led by the attending physician.  Recommendations may be updated based on patient status, additional functional criteria and insurance authorization.   Assistance Recommended at Discharge Intermittent Supervision/Assistance  Patient can return home with the following A little help with walking and/or transfers;A little help with bathing/dressing/bathroom;Assistance with  cooking/housework;Assist for transportation;Help with stairs or ramp for entrance    Functional Status Assessment  Patient has had a recent decline in their functional status and demonstrates the ability to make significant improvements in function in a reasonable and predictable amount of time.  Equipment Recommendations  Other (comment) (defer to next venue of care)    Recommendations for Other Services       Precautions / Restrictions Precautions Precautions: Fall Restrictions Weight Bearing Restrictions: Yes LLE Weight Bearing:  (heel weight bearing in post op shoe (per podiatry))      Mobility Bed Mobility       General bed mobility comments: NT, pt received sitting in recliner with PT present    Transfers Overall transfer level: Needs assistance Equipment used: Rolling walker (2 wheels) Transfers: Sit to/from Stand, Bed to chair/wheelchair/BSC Sit to Stand: Min assist     Step pivot transfers: Min guard     General transfer comment: STS from recliner and BSC with Min A, step pivot transfer from recliner<>BSC with CGA + RW, VC for hand placement      Balance Overall balance assessment: Needs assistance Sitting-balance support: Feet supported, Bilateral upper extremity supported Sitting balance-Leahy Scale: Fair     Standing balance support: Bilateral upper extremity supported, During functional activity, Reliant on assistive device for balance Standing balance-Leahy Scale: Fair         ADL either performed or assessed with clinical judgement   ADL Overall ADL's : Needs assistance/impaired         Toilet Transfer: Minimal assistance;Rolling walker (2 wheels);Min guard;BSC/3in1   Toileting- Clothing Manipulation and Hygiene: Maximal assistance;Sit to/from stand Toileting - Clothing Manipulation Details (indicate cue type and reason): for posterior hygiene 2/2  fatigue       General ADL Comments: Pt having multiple episodes of loose BMs.      Vision Patient Visual Report: No change from baseline       Perception     Praxis      Pertinent Vitals/Pain Pain Assessment Pain Assessment: No/denies pain     Hand Dominance Right   Extremity/Trunk Assessment Upper Extremity Assessment Upper Extremity Assessment: Generalized weakness   Lower Extremity Assessment Lower Extremity Assessment: Generalized weakness   Cervical / Trunk Assessment Cervical / Trunk Assessment:  (\)   Communication Communication Communication: No difficulties   Cognition Arousal/Alertness: Awake/alert Behavior During Therapy: WFL for tasks assessed/performed Overall Cognitive Status: Within Functional Limits for tasks assessed       General Comments: Incontinent of BM & pt unaware, pt reports this is new.     General Comments  Pt on 5L O2 via [place] t/o session, unable to get accurate SpO2 reading during activity.    Exercises Other Exercises Other Exercises: OT provided education re: role of OT, OT POC, post acute recs, sitting up for all meals, EOB/OOB mobility with assistance, home/fall safety, energy conservation techniques (PLB, rest breaks)   Shoulder Instructions      Home Living Family/patient expects to be discharged to:: Private residence Living Arrangements: Children (daughter) Available Help at Discharge: Family;Available 24 hours/day Type of Home: House Home Access: Ramped entrance     Home Layout: One level     Bathroom Shower/Tub: Producer, television/film/video: Handicapped height     Home Equipment: Agricultural consultant (2 wheels);BSC/3in1;Cane - single point   Additional Comments: Lives with dtr with 24/7 supervision available      Prior Functioning/Environment Prior Level of Function : Needs assist             Mobility Comments: Pt ambulatory with SPC but recently started using RW 2/2 ongoing weakness. ADLs Comments: Normally IND with ADLs but requiring assist from daughter for LB dressing since L  toe amputation (05/03/22). Daughter assists with all IADLs. Pt not driving. Sponge bathes at baseline, did have aide coming 1x/wk to assist with full shower but pt reports they have to hire someone else.        OT Problem List: Decreased strength;Decreased range of motion;Decreased activity tolerance;Impaired balance (sitting and/or standing);Cardiopulmonary status limiting activity;Decreased safety awareness;Decreased knowledge of use of DME or AE      OT Treatment/Interventions: Self-care/ADL training;Therapeutic exercise;Neuromuscular education;Energy conservation;DME and/or AE instruction;Manual therapy;Modalities;Balance training;Patient/family education;Visual/perceptual remediation/compensation;Cognitive remediation/compensation;Therapeutic activities;Splinting    OT Goals(Current goals can be found in the care plan section) Acute Rehab OT Goals Patient Stated Goal: get better OT Goal Formulation: With patient Time For Goal Achievement: 05/26/22 Potential to Achieve Goals: Fair   OT Frequency: Min 2X/week    Co-evaluation              AM-PAC OT "6 Clicks" Daily Activity     Outcome Measure Help from another person eating meals?: None Help from another person taking care of personal grooming?: A Little Help from another person toileting, which includes using toliet, bedpan, or urinal?: A Lot Help from another person bathing (including washing, rinsing, drying)?: A Little Help from another person to put on and taking off regular upper body clothing?: A Little Help from another person to put on and taking off regular lower body clothing?: A Little 6 Click Score: 18   End of Session Equipment Utilized During Treatment: Rolling walker (2 wheels);Oxygen Nurse Communication: Mobility  status;Other (comment) (loose BMs, pt on [facility], male purewick discarded 2/2 being soiled with stool)  Activity Tolerance: Patient tolerated treatment well;Patient limited by fatigue Patient left: Other  (comment) (on BSC)  OT Visit Diagnosis: Muscle weakness (generalized) (M62.81);Unsteadiness on feet (R26.81)                Time: 0177-9390 OT Time Calculation (min): 22 min Charges:  OT General Charges $OT Visit: 1 Visit OT Evaluation $OT Eval Low Complexity: 1 Low  [facility] MS, OTR/L ascom [phone]  05/12/22, 2:37 PM

## 2022-05-13 ENCOUNTER — Inpatient Hospital Stay: Payer: Medicare Other

## 2022-05-13 DIAGNOSIS — I5032 Chronic diastolic (congestive) heart failure: Secondary | ICD-10-CM

## 2022-05-13 DIAGNOSIS — J189 Pneumonia, unspecified organism: Secondary | ICD-10-CM | POA: Diagnosis not present

## 2022-05-13 DIAGNOSIS — I739 Peripheral vascular disease, unspecified: Secondary | ICD-10-CM | POA: Diagnosis not present

## 2022-05-13 DIAGNOSIS — J449 Chronic obstructive pulmonary disease, unspecified: Secondary | ICD-10-CM | POA: Diagnosis not present

## 2022-05-13 LAB — BASIC METABOLIC PANEL
Anion gap: 9 (ref 5–15)
BUN: 39 mg/dL — ABNORMAL HIGH (ref 8–23)
CO2: 32 mmol/L (ref 22–32)
Calcium: 8.3 mg/dL — ABNORMAL LOW (ref 8.9–10.3)
Chloride: 106 mmol/L (ref 98–111)
Creatinine, Ser: 1.31 mg/dL — ABNORMAL HIGH (ref 0.61–1.24)
GFR, Estimated: 51 mL/min — ABNORMAL LOW (ref >60)
Glucose, Bld: 108 mg/dL — ABNORMAL HIGH (ref 70–99)
Potassium: 3.4 mmol/L — ABNORMAL LOW (ref 3.5–5.1)
Sodium: 147 mmol/L — ABNORMAL HIGH (ref 135–145)

## 2022-05-13 LAB — CBC WITH DIFFERENTIAL/PLATELET
Abs Immature Granulocytes: 0.06 10*3/uL (ref 0.00–0.07)
Basophils Absolute: 0 10*3/uL (ref 0.0–0.1)
Basophils Relative: 0 %
Eosinophils Absolute: 0 10*3/uL (ref 0.0–0.5)
Eosinophils Relative: 0 %
HCT: 27.7 % — ABNORMAL LOW (ref 39.0–52.0)
Hemoglobin: 8.6 g/dL — ABNORMAL LOW (ref 13.0–17.0)
Immature Granulocytes: 0 %
Lymphocytes Relative: 13 %
Lymphs Abs: 1.9 10*3/uL (ref 0.7–4.0)
MCH: 29.5 pg (ref 26.0–34.0)
MCHC: 31 g/dL (ref 30.0–36.0)
MCV: 94.9 fL (ref 80.0–100.0)
Monocytes Absolute: 1.2 10*3/uL — ABNORMAL HIGH (ref 0.1–1.0)
Monocytes Relative: 8 %
Neutro Abs: 11.5 10*3/uL — ABNORMAL HIGH (ref 1.7–7.7)
Neutrophils Relative %: 79 %
Platelets: 309 10*3/uL (ref 150–400)
RBC: 2.92 MIL/uL — ABNORMAL LOW (ref 4.22–5.81)
RDW: 15.3 % (ref 11.5–15.5)
WBC: 14.7 10*3/uL — ABNORMAL HIGH (ref 4.0–10.5)
nRBC: 0 % (ref 0.0–0.2)

## 2022-05-13 LAB — MAGNESIUM: Magnesium: 2 mg/dL (ref 1.7–2.4)

## 2022-05-13 LAB — LEGIONELLA PNEUMOPHILA SEROGP 1 UR AG: L. pneumophila Serogp 1 Ur Ag: NEGATIVE

## 2022-05-13 LAB — CULTURE, BLOOD (ROUTINE X 2): Culture: NO GROWTH

## 2022-05-13 MED ORDER — POTASSIUM CHLORIDE CRYS ER 20 MEQ PO TBCR
40.0000 meq | EXTENDED_RELEASE_TABLET | Freq: Once | ORAL | Status: AC
  Administered 2022-05-13: 40 meq via ORAL
  Filled 2022-05-13: qty 2

## 2022-05-13 MED ORDER — ALBUTEROL SULFATE (2.5 MG/3ML) 0.083% IN NEBU
2.5000 mg | INHALATION_SOLUTION | Freq: Three times a day (TID) | RESPIRATORY_TRACT | Status: DC
  Administered 2022-05-13 – 2022-05-18 (×14): 2.5 mg via RESPIRATORY_TRACT
  Filled 2022-05-13 (×14): qty 3

## 2022-05-13 NOTE — Progress Notes (Addendum)
Progress Note    [name]  GMW:[id number] DOB: 19-Jun-1928  DOA: 05/10/2022 PCP: Gracelyn Nurse, MD      Brief Narrative:    Medical records reviewed and are as summarized below:  [name] is a 89 y.o. male multiple medical problems including but not limited to recent left great toe amputation for osteomyelitis left great toe, chronic diastolic CHF, PVD, CAD, right carotid artery stenosis, aortic stenosis, right bundle branch block, history of COVID-19 infection, history of aspiration pneumonia, COPD, chronic hypoxic respiratory failure on 4 L oxygen, atrial fibrillation, anemia of chronic disease history was obtained from the patient and [name] (his daughter) at the bedside.  EMR chart was also reviewed.  He presented to the hospital because of increasing shortness of breath, easy fatigability, poor oral intake and constipation.  He has not been feeling well since he had left great toe amputation on 05/03/2022.  Because of easy fatigability and exertional shortness of breath he has mostly been sedentary.  He has not moved his bowels for about 8 days.  He has had a lot of nausea/dry heaving and vomiting mucus.        Assessment/Plan:   Principal Problem:   Community acquired pneumonia Active Problems:   Hypokalemia   Chronic diastolic CHF (congestive heart failure)   COPD (chronic obstructive pulmonary disease)   Chronic respiratory failure with hypoxia   Atrial fibrillation with RVR   Coronary artery disease   Constipation   Acute on chronic diastolic CHF (congestive heart failure)   PVD (peripheral vascular disease)   Elevated troponin    Body mass index is 20.97 kg/m.    Left lower lobe pneumonia: No growth on blood cultures thus far.  Continue Levaquin and Flagyl.  Strep pneumo urine antigen was negative.  Legionella urine antigen is pending.      Acute on chronic hypoxic respiratory failure: He is on 5 L/min oxygen via [place].    He uses 4 L/min  oxygen at baseline.      Hypokalemia: Replete potassium and monitor levels   Mild AKI: Hold torsemide     Elevated troponins: No chest pain.  This is likely due to demand ischemia from infection.       Constipation: Improved.  He has moved his bowels. No acute abnormality or bowel obstruction noted on CT abdomen.       Acute on chronic diastolic CHF: Chest ray done on 05/12/2022 showed increased bilateral pulmonary edema with right-sided pleural effusions.  Torsemide has been held because of bump in creatinine.    COPD with chronic hypoxic respiratory failure: Continue low-dose prednisone and bronchodilators.    Atrial fibrillation with RVR: Continue home metoprolol and Cardizem.  Continue Eliquis.  Replete electrolytes.     PVD, recent left great toe osteomyelitis s/p left great toe amputation on 05/03/2022: Wound culture showed Pseudomonas aeruginosa infection.  He was on ciprofloxacin and Augmentin at home (to be completed on 05/16/2022). Consulted Dr. Alberteen Spindle, podiatrist.  He said the recent left hallux amputation is not healing and foot is starting to appear gangrenous.  He had discussed this with Dr. Wyn Quaker, vascular surgeon, who said the only option would be to do a high level amputation.  Unfortunately, patient is at very high risk for complications for surgery. Prognosis is poor without surgical intervention.   Recent oral thrush prior to admission: Nystatin suspension has been reordered.     Other comorbidities include paroxysmal atrial fibrillation, CAD, PVD, recent  left great toe amputation for left great toe osteomyelitis, anemia of chronic disease    Advanced age and frailty.  Palliative care team was consulted yesterday.   Plan of care was discussed with [name], daughter, over the phone.  Diagnoses, prognosis and goals of care were discussed.  I have encouraged her to speak with Dr. Alberteen Spindle, podiatrist, and Dr. Wyn Quaker, vascular surgeon, for more information.  She asked me about  hospice.  I told her that patient may be appropriate for hospice.  She says she is familiar with hospice because her mother was on hospice.  She wants to speak to the hospice team.  [facility] has been consulted for evaluation by the hospice team     Diet Order             Diet regular Room service appropriate? Yes; Fluid consistency: Thin  Diet effective now                            Consultants: Podiatrist  Procedures: None    Medications:    albuterol  2.5 mg Nebulization TID   apixaban  2.5 mg Oral BID   aspirin EC  81 mg Oral Q0600   atorvastatin  40 mg Oral QHS   diltiazem  300 mg Oral Daily   feeding supplement  237 mL Oral BID BM   fluticasone furoate-vilanterol  1 puff Inhalation Daily   And   umeclidinium bromide  1 puff Inhalation Daily   levofloxacin  750 mg Oral Q48H   metoprolol tartrate  50 mg Oral BID   metroNIDAZOLE  500 mg Oral Q12H   multivitamin with minerals  1 tablet Oral Daily   nystatin  5 mL Mouth/Throat QID   polyethylene glycol  17 g Oral Daily   predniSONE  5 mg Oral Q breakfast   senna-docusate  2 tablet Oral BID   Vitamin D (Ergocalciferol)  50,000 Units Oral Q7 days   Continuous Infusions:   Anti-infectives (From admission, onward)    Start     Dose/Rate Route Frequency Ordered Stop   05/11/22 2200  levofloxacin (LEVAQUIN) tablet 750 mg        750 mg Oral Every 48 hours 05/11/22 1034     05/11/22 1130  metroNIDAZOLE (FLAGYL) tablet 500 mg        500 mg Oral Every 12 hours 05/11/22 1034     05/11/22 0800  ciprofloxacin (CIPRO) tablet 500 mg  Status:  Discontinued        500 mg Oral 2 times daily 05/10/22 1828 05/11/22 1034   05/11/22 0600  cefTRIAXone (ROCEPHIN) 2 g in sodium chloride 0.9 % 100 mL IVPB  Status:  Discontinued        2 g 200 mL/hr over 30 Minutes Intravenous Every 24 hours 05/10/22 1820 05/11/22 1034   05/10/22 1830  azithromycin (ZITHROMAX) 500 mg in sodium chloride 0.9 % 250 mL IVPB  Status:  Discontinued         500 mg 250 mL/hr over 60 Minutes Intravenous Every 24 hours 05/10/22 1820 05/11/22 1034   05/10/22 1730  ceFEPIme (MAXIPIME) 1 g in sodium chloride 0.9 % 100 mL IVPB        1 g 200 mL/hr over 30 Minutes Intravenous  Once 05/10/22 1721 05/10/22 1945              Family Communication/Anticipated D/C date and plan/Code Status   DVT prophylaxis: apixaban (ELIQUIS) tablet  2.5 mg Start: 05/10/22 2200 apixaban (ELIQUIS) tablet 2.5 mg     Code Status: DNR  Family Communication: Plan discharged with his daughter at the bedside. Disposition Plan: Plan to discharge home in 1 to 2 days   Status is: Inpatient Remains inpatient appropriate because: Antibiotics for pneumonia        Subjective:   Interval events noted.  He is sitting up in the chair.  Breathing is better today.  No other complaints.  Objective:    Vitals:   05/13/22 0931 05/13/22 1000 05/13/22 1123 05/13/22 1214  BP: (!) 64/40 (!) 49/38 104/61 106/62  Pulse: (!) 102 86 79 93  Resp:    20  Temp: 99.5 F (37.5 C)  98.6 F (37 C) 98.1 F (36.7 C)  TempSrc: Oral     SpO2: 97%   94%  Weight:      Height:       No data found.   Intake/Output Summary (Last 24 hours) at 05/13/2022 1351 Last data filed at 05/13/2022 1130 Gross per 24 hour  Intake 240 ml  Output 325 ml  Net -85 ml   Filed Weights   05/10/22 1251  Weight: 64.4 kg    Exam:    GEN: NAD SKIN: Warm and dry EYES: No pallor or icterus ENT: MMM CV: Irregular rate and rhythm PULM: Decreased air entry bilaterally, no wheezing or rales heard ABD: soft, ND, NT, +BS CNS: AAO x 3, non focal EXT: left foot gangrene      Data Reviewed:   I have personally reviewed following labs and imaging studies:  Labs: Labs show the following:   Basic Metabolic Panel: Recent Labs  Lab 05/10/22 1529 05/11/22 0547 05/13/22 0335  NA 139 141 147*  K 2.7* 3.3* 3.4*  CL 96* 101 106  CO2 30 29 32  GLUCOSE 99 85 108*  BUN 22 23 39*   CREATININE 1.02 1.04 1.31*  CALCIUM 8.5* 8.3* 8.3*  MG 1.7 2.2 2.0   GFR Estimated Creatinine Clearance: 33.5 mL/min (A) (by C-G formula based on SCr of 1.31 mg/dL (H)). Liver Function Tests: Recent Labs  Lab 05/10/22 1529  AST 38  ALT 15  ALKPHOS 66  BILITOT 0.6  PROT 6.8  ALBUMIN 2.9*   No results for input(s): "LIPASE", "AMYLASE" in the last 168 hours. No results for input(s): "AMMONIA" in the last 168 hours. Coagulation profile No results for input(s): "INR", "PROTIME" in the last 168 hours.  CBC: Recent Labs  Lab 05/10/22 1529 05/13/22 0335  WBC 10.5 14.7*  NEUTROABS 8.1* 11.5*  HGB 8.8* 8.6*  HCT 27.7* 27.7*  MCV 93.9 94.9  PLT 295 309   Cardiac Enzymes: No results for input(s): "CKTOTAL", "CKMB", "CKMBINDEX", "TROPONINI" in the last 168 hours. BNP (last 3 results) No results for input(s): "PROBNP" in the last 8760 hours. CBG: Recent Labs  Lab 05/12/22 0144  GLUCAP 142*   D-Dimer: No results for input(s): "DDIMER" in the last 72 hours. Hgb A1c: No results for input(s): "HGBA1C" in the last 72 hours. Lipid Profile: No results for input(s): "CHOL", "HDL", "LDLCALC", "TRIG", "CHOLHDL", "LDLDIRECT" in the last 72 hours. Thyroid function studies: No results for input(s): "TSH", "T4TOTAL", "T3FREE", "THYROIDAB" in the last 72 hours.  Invalid input(s): "FREET3" Anemia work up: No results for input(s): "VITAMINB12", "FOLATE", "FERRITIN", "TIBC", "IRON", "RETICCTPCT" in the last 72 hours. Sepsis Labs: Recent Labs  Lab 05/10/22 1529 05/13/22 0335  WBC 10.5 14.7*  LATICACIDVEN 1.3  --  Microbiology Recent Results (from the past 240 hour(s))  Culture, blood (Routine X 2) w Reflex to ID Panel     Status: None (Preliminary result)   Collection Time: 05/10/22  8:45 PM   Specimen: BLOOD  Result Value Ref Range Status   Specimen Description BLOOD BLOOD LEFT HAND  Final   Special Requests   Final    BOTTLES DRAWN AEROBIC AND ANAEROBIC Blood Culture  results may not be optimal due to an inadequate volume of blood received in culture bottles   Culture   Final    NO GROWTH 3 DAYS Performed at [facility], [address] [place], [place] [address]    Report Status PENDING  Incomplete  Culture, blood (Routine X 2) w Reflex to ID Panel     Status: None (Preliminary result)   Collection Time: 05/10/22  8:45 PM   Specimen: BLOOD  Result Value Ref Range Status   Specimen Description BLOOD RIGHT ANTECUBITAL  Final   Special Requests   Final    BOTTLES DRAWN AEROBIC AND ANAEROBIC Blood Culture adequate volume   Culture   Final    NO GROWTH 3 DAYS Performed at [facility], [address], [place], [place] [address]    Report Status PENDING  Incomplete    Procedures and diagnostic studies:  DG Chest Port 1 View  Result Date: 05/12/2022 CLINICAL DATA:  Acute respiratory failure with hypoxia. EXAM: PORTABLE CHEST 1 VIEW COMPARISON:  May 10, 2022. FINDINGS: Stable cardiomediastinal silhouette. Mildly increased diffuse interstitial densities are noted concerning for pulmonary edema. Bilateral pleural effusions are noted. Bony thorax is unremarkable. IMPRESSION: Increased bilateral pulmonary edema with associated pleural effusions. Electronically Signed   By: Lupita Raider M.D.   On: 05/12/2022 10:00               LOS: 2 days   [employer]  [facility]   Pager on [email or web address]. If 7PM-7AM, please contact night-coverage at [email or web address]     05/13/2022, 1:51 PM

## 2022-05-13 NOTE — Progress Notes (Signed)
MEWS Progress Note  Patient Details Name: [name] MRN: [id number] DOB: [birth year] Today's Date: 05/13/2022   MEWS Flowsheet Documentation:  Assess: MEWS Score Temp: 99.5 F (37.5 C) BP: (!) 49/38 (RN Thedford Bunton notifed and made aware. -C.Draughn, ROCCPNS) MAP (mmHg): 72 Pulse Rate: 86 ECG Heart Rate: (!) 117 Resp: 18 Level of Consciousness: Alert SpO2: 97 % O2 Device: Nasal Cannula Patient Activity (if Appropriate): In chair O2 Flow Rate (L/min): 5 L/min Assess: MEWS Score MEWS Temp: 0 MEWS Systolic: 3 MEWS Pulse: 0 MEWS RR: 0 MEWS LOC: 0 MEWS Score: 3 MEWS Score Color: Yellow Assess: SIRS CRITERIA SIRS Temperature : 0 SIRS Respirations : 0 SIRS Pulse: 0 SIRS WBC: 1 SIRS Score Sum : 1 Assess: if the MEWS score is Yellow or Red Were vital signs taken at a resting state?: Yes Focused Assessment: No change from prior assessment Does the patient meet 2 or more of the SIRS criteria?: No MEWS guidelines implemented : Yes, yellow Treat MEWS Interventions: Considered administering scheduled or prn medications/treatments as ordered Take Vital Signs Increase Vital Sign Frequency : Yellow: Q2hr x1, continue Q4hrs until patient remains green for 12hrs Escalate MEWS: Escalate: Yellow: Discuss with charge nurse and consider notifying provider and/or RRT Notify: Charge Nurse/RN Name of Charge Nurse/RN Notified: Verl Bangs, RN Provider Notification Provider Name/Title: Myriam Forehand Date Provider Notified: 05/12/22 Time Provider Notified: [phone] Method of Notification: Page (secute chat) Notification Reason: Other (Comment) (would benefit from palliative consult) Provider response: Other (Comment), See new orders (secure chat) Date of Provider Response: 05/12/22    Yellow Mew score guidelines implemented.   [name] 05/13/2022, 11:08 AM

## 2022-05-13 NOTE — TOC Progression Note (Signed)
Transition of Care [facility]) - Progression Note    Patient Details  Name: [name] MRN: [id number] Date of Birth: 1928-05-06  [facility] CM/SW Contact  Allena Katz, LCSW Phone Number: 05/13/2022, 3:06 PM  Clinical Narrative:   CSW spoke with daughter who reports she would like a palliative hospice consult with [facility]. Daughter does not want her dad to go to SNF and wants to see about taking him home with hospice. Message relayed to hospice team.          Expected Discharge Plan and Services                                               Social Determinants of Health (SDOH) Interventions SDOH Screenings   Food Insecurity: No Food Insecurity (05/10/2022)  Housing: Low Risk  (05/10/2022)  Transportation Needs: No Transportation Needs (05/10/2022)  Utilities: Not At Risk (05/10/2022)  Tobacco Use: Medium Risk (05/10/2022)    Readmission Risk Interventions    10/10/2019    4:22 PM  Readmission Risk Prevention Plan  Transportation Screening Complete  PCP or Specialist Appt within 3-5 Days Complete  HRI or Home Care Consult Complete  Social Work Consult for Recovery Care Planning/Counseling Complete  Palliative Care Screening Complete  Medication Review [employer]) Complete

## 2022-05-13 NOTE — Progress Notes (Signed)
Physical Therapy Treatment Patient Details Name: [name] MRN: [id number] DOB: 1928/06/16 Today's Date: 05/13/2022   History of Present Illness Pt is a 89 y/o M admitted on 05/10/22 after presenting with c/o SOB, easy fatigability, poor oral intake & constipation. Pt is being treated for LLL PNA & acute on chronic respiratory failure. PMH: L great toe amputation 2/2 osteomyelitis, chronic diastolic CHF, PVD, CAD, R carotid artery stenosis, aortic stenosis, R BBB, aspiration PNA, COPD, chronic hypoxic respiratory failure on 4L O2, a-fib, anemia    PT Comments    Pt received up in chair, concerns voiced by pt regarding decision needed on next step since Left foot is not healing. Pt recommended for SNF placement at d/c, however daughter may decide to take pt home. He has a w/c, ramp, and 2 RW at home. Will continue to see per POC acutely. Pt does not endorse L foot pain during session. Currently he does require RW for safe transfers and to assist in maintaining heal only WB through L foot with post-op shoe on. Energy level is low and SOB increases on 5L O2 with minimal activity.   Recommendations for follow up therapy are one component of a multi-disciplinary discharge planning process, led by the attending physician.  Recommendations may be updated based on patient status, additional functional criteria and insurance authorization.  Follow Up Recommendations  Can patient physically be transported by private vehicle: Yes    Assistance Recommended at Discharge Intermittent Supervision/Assistance  Patient can return home with the following A little help with walking and/or transfers;A little help with bathing/dressing/bathroom;Assistance with cooking/housework;Assist for transportation;Help with stairs or ramp for entrance   Equipment Recommendations  Other (comment) (Pt has a w/c, ramp, two RW at home)    Recommendations for Other Services       Precautions / Restrictions  Precautions Precautions: Fall Restrictions Weight Bearing Restrictions: Yes LLE Weight Bearing:  (heel weight bearing in post-op shoe per podiatry)     Mobility  Bed Mobility               General bed mobility comments: NT, pt received sitting in recliner with PT present    Transfers Overall transfer level: Needs assistance Equipment used: Rolling walker (2 wheels) Transfers: Sit to/from Stand, Bed to chair/wheelchair/BSC Sit to Stand: Min assist   Step pivot transfers: Min guard       General transfer comment:  (Transfer recliner to [facility] without RW with ModA, CGA with RW)    Ambulation/Gait               General Gait Details:  (Unable to tolerate)   Stairs Stairs:  (Pt has a w/c and ramp at home)           Wheelchair Mobility    Modified Rankin (Stroke Patients Only)       Balance Overall balance assessment: Needs assistance Sitting-balance support: Feet supported, Bilateral upper extremity supported Sitting balance-Leahy Scale: Fair     Standing balance support: Bilateral upper extremity supported, During functional activity, Reliant on assistive device for balance Standing balance-Leahy Scale: Fair                              Cognition Arousal/Alertness: Awake/alert Behavior During Therapy: WFL for tasks assessed/performed Overall Cognitive Status: Within Functional Limits for tasks assessed  Exercises      General Comments General comments (skin integrity, edema, etc.): Pt on 5L O2 via [place], unable to attain accurate reading with activity. Increased SOB noted upon minimal exertion      Pertinent Vitals/Pain Pain Assessment Pain Assessment: No/denies pain    Home Living                          Prior Function            PT Goals (current goals can now be found in the care plan section) Acute Rehab PT Goals Patient Stated Goal: get better     Frequency    Min 3X/week      PT Plan      Co-evaluation              AM-PAC PT "6 Clicks" Mobility   Outcome Measure  Help needed turning from your back to your side while in a flat bed without using bedrails?: None Help needed moving from lying on your back to sitting on the side of a flat bed without using bedrails?: A Little Help needed moving to and from a bed to a chair (including a wheelchair)?: A Little Help needed standing up from a chair using your arms (e.g., wheelchair or bedside chair)?: A Little Help needed to walk in hospital room?: A Little Help needed climbing 3-5 steps with a railing? : A Lot 6 Click Score: 18    End of Session Equipment Utilized During Treatment: Oxygen Activity Tolerance: Patient tolerated treatment well Patient left: in chair;with call bell/phone within reach;with chair alarm set;with nursing/sitter in room Nurse Communication: Mobility status PT Visit Diagnosis: Muscle weakness (generalized) (M62.81);Unsteadiness on feet (R26.81);Difficulty in walking, not elsewhere classified (R26.2)     Time: 2025-4270 PT Time Calculation (min) (ACUTE ONLY): 24 min  Charges:  $Therapeutic Exercise: 8-22 mins $Therapeutic Activity: 8-22 mins                    Zadie Cleverly, PTA  Jannet Askew 05/13/2022, 1:45 PM

## 2022-05-13 NOTE — Progress Notes (Signed)
Subjective/Chief Complaint: Patient seen for follow-up of recent amputation of his left great toe.   Objective: Vital signs in last 24 hours: Temp:  [98 F (36.7 C)-99.5 F (37.5 C)] 98.6 F (37 C) (04/15 1123) Pulse Rate:  [79-104] 79 (04/15 1123) Resp:  [16-18] 18 (04/15 0519) BP: (49-135)/(38-64) 104/61 (04/15 1123) SpO2:  [92 %-97 %] 97 % (04/15 0931) Last BM Date : 05/13/22  Intake/Output from previous day: 04/14 0701 - 04/15 0700 In: 240 [P.O.:240] Out: -  Intake/Output this shift: Total I/O In: -  Out: 325 [Urine:325]  Bandage on left foot is dry and intact.  Upon removal the entire remainder of his left great toe is black and has turned gangrenous.  Also starting to show some cyanotic discoloration into the other digits with some early apparent gangrenous changes on the foot.     Lab Results:  Recent Labs    05/10/22 1529 05/13/22 0335  WBC 10.5 14.7*  HGB 8.8* 8.6*  HCT 27.7* 27.7*  PLT 295 309   BMET Recent Labs    05/11/22 0547 05/13/22 0335  NA 141 147*  K 3.3* 3.4*  CL 101 106  CO2 29 32  GLUCOSE 85 108*  BUN 23 39*  CREATININE 1.04 1.31*  CALCIUM 8.3* 8.3*   PT/INR No results for input(s): "LABPROT", "INR" in the last 72 hours. ABG Recent Labs    05/12/22 0214  HCO3 36.6*    Studies/Results: DG Chest Port 1 View  Result Date: 05/12/2022 CLINICAL DATA:  Acute respiratory failure with hypoxia. EXAM: PORTABLE CHEST 1 VIEW COMPARISON:  May 10, 2022. FINDINGS: Stable cardiomediastinal silhouette. Mildly increased diffuse interstitial densities are noted concerning for pulmonary edema. Bilateral pleural effusions are noted. Bony thorax is unremarkable. IMPRESSION: Increased bilateral pulmonary edema with associated pleural effusions. Electronically Signed   By: Lupita Raider M.D.   On: 05/12/2022 10:00    Anti-infectives: Anti-infectives (From admission, onward)    Start     Dose/Rate Route Frequency Ordered Stop   05/11/22  2200  levofloxacin (LEVAQUIN) tablet 750 mg        750 mg Oral Every 48 hours 05/11/22 1034     05/11/22 1130  metroNIDAZOLE (FLAGYL) tablet 500 mg        500 mg Oral Every 12 hours 05/11/22 1034     05/11/22 0800  ciprofloxacin (CIPRO) tablet 500 mg  Status:  Discontinued        500 mg Oral 2 times daily 05/10/22 1828 05/11/22 1034   05/11/22 0600  cefTRIAXone (ROCEPHIN) 2 g in sodium chloride 0.9 % 100 mL IVPB  Status:  Discontinued        2 g 200 mL/hr over 30 Minutes Intravenous Every 24 hours 05/10/22 1820 05/11/22 1034   05/10/22 1830  azithromycin (ZITHROMAX) 500 mg in sodium chloride 0.9 % 250 mL IVPB  Status:  Discontinued        500 mg 250 mL/hr over 60 Minutes Intravenous Every 24 hours 05/10/22 1820 05/11/22 1034   05/10/22 1730  ceFEPIme (MAXIPIME) 1 g in sodium chloride 0.9 % 100 mL IVPB        1 g 200 mL/hr over 30 Minutes Intravenous  Once 05/10/22 1721 05/10/22 1945       Assessment/Plan: s/p * No surgery found * Assessment: 1.  Status post amputation left hallux, now gangrenous. . 2.  Severe peripheral vascular disease  Plan: Betadine and dressing reapplied to the left foot.  Discussed with the  patient that his amputation is not healing and that the foot is starting to appear gangrenous.  Spoke with Dr. Wyn Quaker who states that his only option that he could offer would be a high amputation but does not know if he could make it through the procedure and recovery.  I think the decision at this point that must be made is between palliative care or high amputation.  LOS: 2 days    Ricci Barker 05/13/2022

## 2022-05-14 ENCOUNTER — Telehealth (INDEPENDENT_AMBULATORY_CARE_PROVIDER_SITE_OTHER): Payer: Self-pay

## 2022-05-14 DIAGNOSIS — I96 Gangrene, not elsewhere classified: Secondary | ICD-10-CM | POA: Diagnosis present

## 2022-05-14 DIAGNOSIS — N179 Acute kidney failure, unspecified: Secondary | ICD-10-CM | POA: Diagnosis not present

## 2022-05-14 DIAGNOSIS — J189 Pneumonia, unspecified organism: Secondary | ICD-10-CM | POA: Diagnosis not present

## 2022-05-14 DIAGNOSIS — I739 Peripheral vascular disease, unspecified: Secondary | ICD-10-CM | POA: Diagnosis not present

## 2022-05-14 DIAGNOSIS — Z7189 Other specified counseling: Secondary | ICD-10-CM

## 2022-05-14 DIAGNOSIS — I5032 Chronic diastolic (congestive) heart failure: Secondary | ICD-10-CM | POA: Diagnosis not present

## 2022-05-14 LAB — BASIC METABOLIC PANEL WITH GFR
Anion gap: 9 (ref 5–15)
BUN: 43 mg/dL — ABNORMAL HIGH (ref 8–23)
CO2: 33 mmol/L — ABNORMAL HIGH (ref 22–32)
Calcium: 8.4 mg/dL — ABNORMAL LOW (ref 8.9–10.3)
Chloride: 108 mmol/L (ref 98–111)
Creatinine, Ser: 1.56 mg/dL — ABNORMAL HIGH (ref 0.61–1.24)
GFR, Estimated: 42 mL/min — ABNORMAL LOW
Glucose, Bld: 110 mg/dL — ABNORMAL HIGH (ref 70–99)
Potassium: 4 mmol/L (ref 3.5–5.1)
Sodium: 150 mmol/L — ABNORMAL HIGH (ref 135–145)

## 2022-05-14 LAB — CBC WITH DIFFERENTIAL/PLATELET
Abs Immature Granulocytes: 0.11 10*3/uL — ABNORMAL HIGH (ref 0.00–0.07)
Basophils Absolute: 0 10*3/uL (ref 0.0–0.1)
Basophils Relative: 0 %
Eosinophils Absolute: 0 10*3/uL (ref 0.0–0.5)
Eosinophils Relative: 0 %
HCT: 29.7 % — ABNORMAL LOW (ref 39.0–52.0)
Hemoglobin: 9.2 g/dL — ABNORMAL LOW (ref 13.0–17.0)
Immature Granulocytes: 1 %
Lymphocytes Relative: 12 %
Lymphs Abs: 1.9 10*3/uL (ref 0.7–4.0)
MCH: 29.4 pg (ref 26.0–34.0)
MCHC: 31 g/dL (ref 30.0–36.0)
MCV: 94.9 fL (ref 80.0–100.0)
Monocytes Absolute: 0.9 10*3/uL (ref 0.1–1.0)
Monocytes Relative: 6 %
Neutro Abs: 12.4 10*3/uL — ABNORMAL HIGH (ref 1.7–7.7)
Neutrophils Relative %: 81 %
Platelets: 372 10*3/uL (ref 150–400)
RBC: 3.13 MIL/uL — ABNORMAL LOW (ref 4.22–5.81)
RDW: 15.7 % — ABNORMAL HIGH (ref 11.5–15.5)
WBC: 15.2 10*3/uL — ABNORMAL HIGH (ref 4.0–10.5)
nRBC: 0 % (ref 0.0–0.2)

## 2022-05-14 LAB — MAGNESIUM: Magnesium: 2.2 mg/dL (ref 1.7–2.4)

## 2022-05-14 MED ORDER — SODIUM CHLORIDE 0.45 % IV SOLN: INTRAVENOUS | Status: AC

## 2022-05-14 MED ORDER — LEVOFLOXACIN 500 MG PO TABS
500.0000 mg | ORAL_TABLET | ORAL | Status: DC
  Administered 2022-05-14: 500 mg via ORAL
  Filled 2022-05-14: qty 1

## 2022-05-14 NOTE — Telephone Encounter (Signed)
[name] called wanting to know what options are available for her father. She said that you and Dr. Clide Cliff talked and she would like to know what you two came up with.   Please advise.

## 2022-05-14 NOTE — Progress Notes (Addendum)
Progress Note    DALLON DACOSTA  ZOX:[id number] DOB: Nov 23, 1928  DOA: 05/10/2022 PCP: Gracelyn Nurse, MD      Brief Narrative:    Medical records reviewed and are as summarized below:  [name] is a 89 y.o. male multiple medical problems including but not limited to recent left great toe amputation for osteomyelitis left great toe, chronic diastolic CHF, PVD, CAD, right carotid artery stenosis, aortic stenosis, right bundle branch block, history of COVID-19 infection, history of aspiration pneumonia, COPD, chronic hypoxic respiratory failure on 4 L oxygen, atrial fibrillation, anemia of chronic disease history was obtained from the patient and [name] (his daughter) at the bedside.  EMR chart was also reviewed.  He presented to the hospital because of increasing shortness of breath, easy fatigability, poor oral intake and constipation.  He has not been feeling well since he had left great toe amputation on 05/03/2022.  Because of easy fatigability and exertional shortness of breath he has mostly been sedentary.  He has not moved his bowels for about 8 days.  He has had a lot of nausea/dry heaving and vomiting mucus.        Assessment/Plan:   Principal Problem:   Community acquired pneumonia Active Problems:   Hypokalemia   Chronic diastolic CHF (congestive heart failure)   COPD (chronic obstructive pulmonary disease)   Chronic respiratory failure with hypoxia   Atrial fibrillation with RVR   Coronary artery disease   Constipation   Acute on chronic diastolic CHF (congestive heart failure)   PVD (peripheral vascular disease)   Elevated troponin   Gangrene of left foot   AKI (acute kidney injury)    Body mass index is 20.97 kg/m.    Left lower lobe pneumonia: No growth on blood cultures thus far.  Continue Levaquin and Flagyl.  Strep pneumo and Legionella urine antigens were negative.        Acute on chronic hypoxic respiratory failure: He is on 6 L/min  oxygen via .    He uses 4 L/min oxygen at baseline.      Hypokalemia: Improved   Worsening AKI: Creatinine went up to 1.56.  Continue to hold torsemide.  Start IV fluids for hydration.     Elevated troponins: No chest pain.  This is likely due to demand ischemia from infection.       Constipation: Improved.  He has moved his bowels. No acute abnormality or bowel obstruction noted on CT abdomen.       Acute on chronic diastolic CHF: Chest ray done on 05/12/2022 showed increased bilateral pulmonary edema with right-sided pleural effusions.  Torsemide has been held    COPD with chronic hypoxic respiratory failure: Continue low-dose prednisone and bronchodilators.    Atrial fibrillation with RVR: Continue metoprolol, Cardizem and Eliquis     PVD, recent left great toe osteomyelitis s/p left great toe amputation on 05/03/2022: Wound culture showed Pseudomonas aeruginosa infection.  He was on ciprofloxacin and Augmentin at home (to be completed on 05/16/2022). Consulted Dr. Alberteen Spindle, podiatrist.  He said the recent left hallux amputation is not healing and foot is starting to appear gangrenous.  He had discussed this with Dr. Wyn Quaker, vascular surgeon, who said the only option would be to do a high level amputation.  Unfortunately, patient is at very high risk for complications for surgery. Prognosis is poor without surgical intervention.   Recent oral thrush prior to admission: Nystatin suspension has been reordered.  Other comorbidities include paroxysmal atrial fibrillation, CAD, PVD, recent left great toe amputation for left great toe osteomyelitis, anemia of chronic disease    Advanced age and frailty.     Plan of care was discussed with patient and [name], daughter, at the bedside.  She said she has spoken Crystal, NP, with palliative care and Dr. Alberteen Spindle, podiatrist.  She is waiting to speak to Dr. Wyn Quaker regarding possible amputation.      Diet Order             Diet regular  Room service appropriate? Yes; Fluid consistency: Thin  Diet effective now                            Consultants: Podiatrist Vascular surgeon  Procedures: None    Medications:    albuterol  2.5 mg Nebulization TID   apixaban  2.5 mg Oral BID   aspirin EC  81 mg Oral Q0600   atorvastatin  40 mg Oral QHS   diltiazem  300 mg Oral Daily   feeding supplement  237 mL Oral BID BM   fluticasone furoate-vilanterol  1 puff Inhalation Daily   And   umeclidinium bromide  1 puff Inhalation Daily   levofloxacin  500 mg Oral Q48H   metoprolol tartrate  50 mg Oral BID   metroNIDAZOLE  500 mg Oral Q12H   multivitamin with minerals  1 tablet Oral Daily   nystatin  5 mL Mouth/Throat QID   polyethylene glycol  17 g Oral Daily   predniSONE  5 mg Oral Q breakfast   senna-docusate  2 tablet Oral BID   Vitamin D (Ergocalciferol)  50,000 Units Oral Q7 days   Continuous Infusions:  sodium chloride 75 mL/hr at 05/14/22 1538     Anti-infectives (From admission, onward)    Start     Dose/Rate Route Frequency Ordered Stop   05/14/22 2000  levofloxacin (LEVAQUIN) tablet 500 mg        500 mg Oral Every 48 hours 05/14/22 0831     05/11/22 2200  levofloxacin (LEVAQUIN) tablet 750 mg  Status:  Discontinued        750 mg Oral Every 48 hours 05/11/22 1034 05/14/22 0831   05/11/22 1130  metroNIDAZOLE (FLAGYL) tablet 500 mg        500 mg Oral Every 12 hours 05/11/22 1034     05/11/22 0800  ciprofloxacin (CIPRO) tablet 500 mg  Status:  Discontinued        500 mg Oral 2 times daily 05/10/22 1828 05/11/22 1034   05/11/22 0600  cefTRIAXone (ROCEPHIN) 2 g in sodium chloride 0.9 % 100 mL IVPB  Status:  Discontinued        2 g 200 mL/hr over 30 Minutes Intravenous Every 24 hours 05/10/22 1820 05/11/22 1034   05/10/22 1830  azithromycin (ZITHROMAX) 500 mg in sodium chloride 0.9 % 250 mL IVPB  Status:  Discontinued        500 mg 250 mL/hr over 60 Minutes Intravenous Every 24 hours 05/10/22 1820  05/11/22 1034   05/10/22 1730  ceFEPIme (MAXIPIME) 1 g in sodium chloride 0.9 % 100 mL IVPB        1 g 200 mL/hr over 30 Minutes Intravenous  Once 05/10/22 1721 05/10/22 1945              Family Communication/Anticipated D/C date and plan/Code Status   DVT prophylaxis: apixaban (ELIQUIS) tablet 2.5 mg  Start: 05/10/22 2200 apixaban (ELIQUIS) tablet 2.5 mg     Code Status: DNR  Family Communication: Plan discharged with his daughter at the bedside. Disposition Plan: To be determined   Status is: Inpatient Remains inpatient appropriate because: Antibiotics for pneumonia        Subjective:   Interval events noted.  No pain in the foot, no shortness of breath or chest pain. He's still not eating or drinking well.  He does not want any Ensure or nutritional supplement because it tastes chalky    Objective:    Vitals:   05/14/22 0434 05/14/22 0719 05/14/22 1010 05/14/22 1642  BP:   114/78 (!) 109/59  Pulse:   77 (!) 124  Resp: (!) 24  20   Temp:   98.7 F (37.1 C) 98.3 F (36.8 C)  TempSrc:   Oral   SpO2:  91% 92% 93%  Weight:      Height:       No data found.   Intake/Output Summary (Last 24 hours) at 05/14/2022 1650 Last data filed at 05/14/2022 1538 Gross per 24 hour  Intake 385.3 ml  Output 500 ml  Net -114.7 ml   Filed Weights   05/10/22 1251  Weight: 64.4 kg    Exam:    GEN: NAD SKIN: No rash EYES: No pallor or icterus ENT: MMM CV: RRR PULM: Adequate air entry bilaterally ABD: soft, ND, NT, +BS CNS: AAO x 3, non focal EXT: Left foot gangrene     Data Reviewed:   I have personally reviewed following labs and imaging studies:  Labs: Labs show the following:   Basic Metabolic Panel: Recent Labs  Lab 05/10/22 1529 05/11/22 0547 05/13/22 0335 05/14/22 0502  NA 139 141 147* 150*  K 2.7* 3.3* 3.4* 4.0  CL 96* 101 106 108  CO2 30 29 32 33*  GLUCOSE 99 85 108* 110*  BUN 22 23 39* 43*  CREATININE 1.02 1.04 1.31* 1.56*   CALCIUM 8.5* 8.3* 8.3* 8.4*  MG 1.7 2.2 2.0 2.2   GFR Estimated Creatinine Clearance: 28.1 mL/min (A) (by C-G formula based on SCr of 1.56 mg/dL (H)). Liver Function Tests: Recent Labs  Lab 05/10/22 1529  AST 38  ALT 15  ALKPHOS 66  BILITOT 0.6  PROT 6.8  ALBUMIN 2.9*   No results for input(s): "LIPASE", "AMYLASE" in the last 168 hours. No results for input(s): "AMMONIA" in the last 168 hours. Coagulation profile No results for input(s): "INR", "PROTIME" in the last 168 hours.  CBC: Recent Labs  Lab 05/10/22 1529 05/13/22 0335 05/14/22 0502  WBC 10.5 14.7* 15.2*  NEUTROABS 8.1* 11.5* 12.4*  HGB 8.8* 8.6* 9.2*  HCT 27.7* 27.7* 29.7*  MCV 93.9 94.9 94.9  PLT 295 309 372   Cardiac Enzymes: No results for input(s): "CKTOTAL", "CKMB", "CKMBINDEX", "TROPONINI" in the last 168 hours. BNP (last 3 results) No results for input(s): "PROBNP" in the last 8760 hours. CBG: Recent Labs  Lab 05/12/22 0144  GLUCAP 142*   D-Dimer: No results for input(s): "DDIMER" in the last 72 hours. Hgb A1c: No results for input(s): "HGBA1C" in the last 72 hours. Lipid Profile: No results for input(s): "CHOL", "HDL", "LDLCALC", "TRIG", "CHOLHDL", "LDLDIRECT" in the last 72 hours. Thyroid function studies: No results for input(s): "TSH", "T4TOTAL", "T3FREE", "THYROIDAB" in the last 72 hours.  Invalid input(s): "FREET3" Anemia work up: No results for input(s): "VITAMINB12", "FOLATE", "FERRITIN", "TIBC", "IRON", "RETICCTPCT" in the last 72 hours. Sepsis Labs: Recent Labs  Lab 05/10/22 1529 05/13/22 0335 05/14/22 0502  WBC 10.5 14.7* 15.2*  LATICACIDVEN 1.3  --   --     Microbiology Recent Results (from the past 240 hour(s))  Culture, blood (Routine X 2) w Reflex to ID Panel     Status: None (Preliminary result)   Collection Time: 05/10/22  8:45 PM   Specimen: BLOOD  Result Value Ref Range Status   Specimen Description BLOOD BLOOD LEFT HAND  Final   Special Requests   Final     BOTTLES DRAWN AEROBIC AND ANAEROBIC Blood Culture results may not be optimal due to an inadequate volume of blood received in culture bottles   Culture   Final    NO GROWTH 4 DAYS Performed at [facility], [address], [place], [place] [address]    Report Status PENDING  Incomplete  Culture, blood (Routine X 2) w Reflex to ID Panel     Status: None (Preliminary result)   Collection Time: 05/10/22  8:45 PM   Specimen: BLOOD  Result Value Ref Range Status   Specimen Description BLOOD RIGHT ANTECUBITAL  Final   Special Requests   Final    BOTTLES DRAWN AEROBIC AND ANAEROBIC Blood Culture adequate volume   Culture   Final    NO GROWTH 4 DAYS Performed at [facility], [address] [place], [place] [address]    Report Status PENDING  Incomplete    Procedures and diagnostic studies:  DG Abd 2 Views  Result Date: 05/13/2022 CLINICAL DATA:  Abdominal distension EXAM: ABDOMEN - 2 VIEW COMPARISON:  05/10/2022 FINDINGS: Nonobstructive bowel gas pattern with mildly prominent loops of both large and small bowel. There is no evidence of free air. Bibasilar consolidations and effusions. Advanced atherosclerotic vascular calcifications. Osseous demineralization with multiple known compression fractures. IMPRESSION: 1. Nonobstructive bowel gas pattern. 2. Bibasilar consolidations and effusions. Electronically Signed   By: Duanne Guess D.O.   On: 05/13/2022 18:51               LOS: 3 days   Athene Schuhmacher  [facility]   Pager on [email or web address]. If 7PM-7AM, please contact night-coverage at [email or web address]     05/14/2022, 4:50 PM

## 2022-05-14 NOTE — Progress Notes (Signed)
PHARMACY NOTE:  ANTIMICROBIAL RENAL DOSAGE ADJUSTMENT  Current antimicrobial regimen includes a mismatch between antimicrobial dosage and estimated renal function.  As per policy approved by the Pharmacy & Therapeutics and Medical Executive Committees, the antimicrobial dosage will be adjusted accordingly.  Current antimicrobial dosage:  Levofloxacin  PO every 48 hours  Indication: CAP  Renal Function:  Estimated Creatinine Clearance: 28.1 mL/min (A) (by C-G formula based on SCr of 1.56 mg/dL (H)).  Antimicrobial dosage has been changed to:  Levofloxacin  every 48 hours  Additional comments:  Thank you for allowing pharmacy to be a part of this patient's care.  Gardner Candle, PharmD, BCPS Clinical Pharmacist 05/14/2022 8:33 AM

## 2022-05-14 NOTE — Progress Notes (Signed)
OT Cancellation Note  Patient Details Name: KENZIE FLAKES MRN: [id number] DOB: 02/18/1928   Cancelled Treatment:    Reason Eval/Treat Not Completed: Fatigue/lethargy limiting ability to participate. Pt received in bed with daughter present. Just completed breathing treatment. Pt endorsed fatigue and "just not having a good day." Daughter reports pt has not been eating very much. Pt requested OT come back tomorrow. Will re-attempt at later date/time.  Gerrie Nordmann 05/14/2022, 5:12 PM

## 2022-05-14 NOTE — Consult Note (Addendum)
Consultation Note Date: 05/14/2022   Patient Name: [name]  DOB: 06/13/1928  MRN: [id number]  Age / Sex: 89 y.o., male  PCP: Gracelyn Nurse, MD Referring Physician: Lurene Shadow, MD  Reason for Consultation: Establishing goals of care  HPI/Patient Profile: [name] is a 89 y.o. male multiple medical problems including but not limited to recent left great toe amputation for osteomyelitis left great toe, chronic diastolic CHF, PVD, CAD, right carotid artery stenosis, aortic stenosis, right bundle branch block, history of COVID-19 infection, history of aspiration pneumonia, COPD, chronic hypoxic respiratory failure on 4 L oxygen, atrial fibrillation, anemia of chronic disease history was obtained from the patient and [name] (his daughter) at the bedside.  EMR chart was also reviewed.  He presented to the hospital because of increasing shortness of breath, easy fatigability, poor oral intake and constipation.  He has not been feeling well since he had left great toe amputation on 05/03/2022.  Because of easy fatigability and exertional shortness of breath he has mostly been sedentary.  He has not moved his bowels for about 8 days.  He has had a lot of nausea/dry heaving and vomiting mucus.     Clinical Assessment and Goals of Care: Notes and labs reviewed.  Epic chat received regarding daughter's concerns during hospitalization.  Spoke with vascular surgery regarding care moving forward, and option of surgery.  In to see patient.  [name] daughter is at bedside.  Daughter discusses concerns with previous hospitalizations, and current hospitalizations at length and great detail.  She discusses that she is already spoken with service response regarding these issues.  Patient and daughter state he lives at home alone at baseline.  He has been widowed for 4 years.  Daughter is patient's only child, and  patient states they are very close.  Daughter discusses that after each hospitalization over the past few years she can see a decline.  She discusses that prior to January he was able to stand at the sink and wash dishes, and was able to quickly make himself something to eat, and would carry his cane or use it for walking while also pushing an oxygen tank.  She states due to shortness of breath he is not able to do these things.  She states as of recent, he spends his time in the chair except to get up to go to the bathroom.  She discusses the paramedics that comes to the home to evaluate him, and that they work closely with the heart failure team and diuresis needs.   We discussed his diagnosis, prognosis, GOC, EOL wishes disposition and options.  Created space and opportunity for patient  to explore thoughts and feelings regarding current medical information.   A detailed discussion was had today regarding advanced directives.  Concepts specific to code status, artifical feeding and hydration, IV antibiotics and rehospitalization were discussed.  The difference between an aggressive medical intervention path and a comfort care path was discussed.  Values and goals of care important to patient  and family were attempted to be elicited.  Discussed limitations of medical interventions to prolong quality of life in some situations and discussed the concept of human mortality.  We discussed different scenarios, and what they would look like.  Patient states he is tired and when the Lord calls him home, he is ready to go.  He then states if it would add days or weeks to his life, he would be okay with having the amputation.  Daughter then states he is willing to do the surgery for her because he does not want to leave her.  She discusses with him that she would like for him to make the choices that he wants to make, and she understands if he would choose hospice care.  Daughter  discusses with her father  that PMT has no agenda, and only wants to do what he wants to do, and that PMT is not trying to force a particular decision.  Discussed they are talking further and that I would follow-up tomorrow.  SUMMARY OF RECOMMENDATIONS   PMT to follow  Prognosis:  Poor overall       Primary Diagnoses: Present on Admission:  Community acquired pneumonia  Atrial fibrillation with RVR  Chronic diastolic CHF (congestive heart failure)  Chronic respiratory failure with hypoxia  Constipation  COPD (chronic obstructive pulmonary disease)  Coronary artery disease  Hypokalemia  PVD (peripheral vascular disease)  Elevated troponin  Acute on chronic diastolic CHF (congestive heart failure)   I have reviewed the medical record, interviewed the patient and family, and examined the patient. The following aspects are pertinent.  Past Medical History:  Diagnosis Date   (HFpEF) heart failure with preserved ejection fraction    a.) TTE 01/06/2019: EF 55-60%, mild LVH, BAE, mild-mod MR, triv TR, PASP 50.3, G1DD; b.) TTE 10/10/2019: EF 55-60%, mod reduced RVSF, mild LAE, sev RAE, MAC, mod-sec TR. AoV sclerosis, G1DD; c.) TTE 03/01/2022: EF 60-65%, mild LVH, reduced RVSF, mod RVE, PASP 77.7, mod LAE, sev RAE, mild-mod MR, mod TR, mild AR   Aortic atherosclerosis    Aortic stenosis 03/01/2022   a.) TTE 03/01/2022: moderate AS (MPG 25.5); VTI = 0.86 cm   Aspiration pneumonia    Atrial fibrillation with RVR    a.) CHA2DS2VASc = 5 (age x2, CHF, HTN, vascular disease history);  b.) rate/rhythm maintained on oral diltiazem + metoprolol tartrate; chronically anticoagulated with apixaban   Basal cell carcinoma 09/13/2021   Left cheek. EDC   Cardiac murmur    a.) grade I/VI medium pitched mid systolic blowing at lower LSB   Cardiomegaly    Carotid stenosis, right    a.) MRA head/neck 02/28/2022: severe (>90%) RICA; b.) s/p PTA and stent placement 03/18/2022 --> 9 mm proximal, 7 mm distal, 4 cm long exact stent    Central retinal artery occlusion, left    Chronic kidney disease, stage 3a    Complication of anesthesia    a.) post-operative delirium/hallucinations   COPD (chronic obstructive pulmonary disease)    Coronary artery disease    Dependence on supplemental oxygen    a.) 4L/[facility]   History of 2019 novel coronavirus disease (COVID-19) 01/04/2019   HLD (hyperlipidemia)    Hypertension    Long term current use of anticoagulant    a.) apixaban   Long term current use of systemic steroids    a.) prednisone   Osteomyelitis of great toe of left foot    PAD (peripheral artery disease)    a.) s/p RIGHT  femoral endarterectomy on 08/09/2020   Pneumonia due to COVID-19 virus    PVD (peripheral vascular disease)    RBBB (right bundle branch block)    Sepsis    Skin cancer    nose and ear - tx with Mohs   Thoracic aortic ectasia    a.) measured 3.6 x 3.3 cm on 10/08/2019   Social History   Socioeconomic History   Marital status: Widowed    Spouse name: Not on file   Number of children: 1   Years of education: Not on file   Highest education level: Not on file  Occupational History   Not on file  Tobacco Use   Smoking status: Former    Packs/day: 1.00    Years: 30.00    Additional pack years: 0.00    Total pack years: 30.00    Types: Cigarettes    Quit date: 68    Years since quitting: 27.3   Smokeless tobacco: Never  Vaping Use   Vaping Use: Never used  Substance and Sexual Activity   Alcohol use: Not Currently   Drug use: Never   Sexual activity: Not Currently  Other Topics Concern   Not on file  Social History Narrative   Lives at home with 1 daughter.   Social Determinants of Health   Financial Resource Strain: Not on file  Food Insecurity: No Food Insecurity (05/10/2022)   Hunger Vital Sign    Worried About Running Out of Food in the Last Year: Never true    Ran Out of Food in the Last Year: Never true  Transportation Needs: No Transportation Needs (05/10/2022)    PRAPARE - Administrator, Civil Service (Medical): No    Lack of Transportation (Non-Medical): No  Physical Activity: Not on file  Stress: Not on file  Social Connections: Not on file   History reviewed. No pertinent family history. Scheduled Meds:  albuterol  2.5 mg Nebulization TID   apixaban  2.5 mg Oral BID   aspirin EC  81 mg Oral Q0600   atorvastatin  40 mg Oral QHS   diltiazem  300 mg Oral Daily   feeding supplement  237 mL Oral BID BM   fluticasone furoate-vilanterol  1 puff Inhalation Daily   And   umeclidinium bromide  1 puff Inhalation Daily   levofloxacin  500 mg Oral Q48H   metoprolol tartrate  50 mg Oral BID   metroNIDAZOLE  500 mg Oral Q12H   multivitamin with minerals  1 tablet Oral Daily   nystatin  5 mL Mouth/Throat QID   polyethylene glycol  17 g Oral Daily   predniSONE  5 mg Oral Q breakfast   senna-docusate  2 tablet Oral BID   Vitamin D (Ergocalciferol)  50,000 Units Oral Q7 days   Continuous Infusions:  sodium chloride 75 mL/hr at 05/14/22 1538   PRN Meds:.acetaminophen, HYDROcodone-acetaminophen, hydrOXYzine, levalbuterol Medications Prior to Admission:  Prior to Admission medications   Medication Sig Start Date End Date Taking? Authorizing Provider  acetaminophen (TYLENOL) 500 MG tablet Take 500 mg by mouth every 6 (six) hours as needed for fever or pain.   Yes [provider]  albuterol (PROVENTIL) (2.5 MG/3ML) 0.083% nebulizer solution Take 2.5 mg by nebulization 4 (four) times daily.   Yes [provider]  alendronate (FOSAMAX) 70 MG tablet Take 70 mg by mouth once a week. Sunday morning with full glass of water and sit up do not lay down 06/07/20  Yes  [provider]  amoxicillin-clavulanate (AUGMENTIN) 875-125 MG tablet Take 1 tablet by mouth 2 (two) times daily. 05/01/22 05/29/22 Yes [provider]  apixaban (ELIQUIS) 2.5 MG TABS tablet Take 2.5 mg by mouth 2 (two) times daily.   Yes [provider]  ascorbic acid (VITAMIN C) 250 MG tablet Take 1 tablet (250 mg total) by mouth 2 (two) times daily. Patient taking differently: Take 250 mg by mouth daily. 08/11/20  Yes Stegmayer, Ranae Plumber, PA-C  aspirin EC 81 MG EC tablet Take 1 tablet (81 mg total) by mouth daily at 6 (six) AM. Swallow whole. 08/11/20  Yes Stegmayer, Ranae Plumber, PA-C  atorvastatin (LIPITOR) 40 MG tablet Take 1 tablet (40 mg total) by mouth daily. Patient taking differently: Take 40 mg by mouth at bedtime. 03/01/22 05/30/22 Yes Gillis Santa, MD  Bacillus Coagulans-Inulin (BENEFIBER PREBIOTIC+PROBIOTIC) CHEW Chew 2 tablets by mouth daily.   Yes [provider]  Calcium Carb-Cholecalciferol (CALCIUM 600/VITAMIN D PO) Take 1 tablet by mouth daily.   Yes [provider]  CARTIA XT 300 MG 24 hr capsule Take 300 mg by mouth daily. 09/16/19  Yes [provider]  ciprofloxacin (CIPRO) 500 MG tablet Take 500 mg by mouth 2 (two) times daily. 05/06/22 05/16/22 Yes [provider]  Fluticasone-Umeclidin-Vilant (TRELEGY ELLIPTA) 100-62.5-25 MCG/INH AEPB Inhale 1 puff into the lungs daily.   Yes [provider]  HYDROcodone-acetaminophen (NORCO/VICODIN) 5-325 MG tablet Take 1 tablet by mouth every 4 (four) hours as needed for moderate pain. 05/03/22 05/03/23 Yes Linus Galas, DPM  metolazone (ZAROXOLYN) 2.5 MG tablet Take 2.5 mg by mouth daily as needed (per heart failure instructions).   Yes [provider]  metoprolol tartrate (LOPRESSOR) 50 MG tablet Take 50 mg by mouth 2 (two) times daily.   Yes [provider]  Multiple Vitamin (MULTIVITAMIN WITH MINERALS) TABS tablet Take 1 tablet by mouth daily. 08/12/20  Yes Stegmayer, Ranae Plumber, PA-C  nystatin (MYCOSTATIN) 100000 UNIT/ML suspension Use as directed 5 mLs in the mouth or throat 4 (four) times daily. 05/09/22  Yes [provider]  potassium chloride (KLOR-CON) 10 MEQ tablet Take 2 tablets (20 mEq total) by mouth daily. Patient  taking differently: Take 10 mEq by mouth daily. Monday and Friday takes 20 meq 01/14/20  Yes Hackney, Tina A, FNP  predniSONE (DELTASONE) 5 MG tablet Take 5 mg by mouth daily with breakfast.   Yes [provider]  senna-docusate (SENOKOT-S) 8.6-50 MG tablet Take 2 tablets by mouth 2 (two) times daily.   Yes [provider]  torsemide (DEMADEX) 20 MG tablet Take  on M, W, F and  on Tu, Thur,Sat & Sunday Patient taking differently: Take 20 mg by mouth daily. Take  on Monday and  Friday 01/11/22  Yes Hackney, Jarold Song, FNP  traMADol (ULTRAM) 50 MG tablet Take 50 mg by mouth every 6 (six) hours as needed for moderate pain. 04/08/22  Yes [provider]  Vitamin D, Ergocalciferol, (DRISDOL) 1.25 MG (50000 UNIT) CAPS capsule Take 1 capsule (50,000 Units total) by mouth every 7 (seven) days. Patient taking differently: Take 50,000 Units by mouth every 7 (seven) days. Sunday 10/15/19  Yes Wouk, Wilfred Curtis, MD  magnesium oxide (MAG-OX) 400 (240 Mg) MG tablet Take 1 tablet (400 mg total) by mouth daily. Patient not taking: Reported on 05/10/2022 03/30/22   Alford Highland, MD  OXYGEN Inhale 4 L into the lungs continuous.    [provider]  povidone-iodine (BETADINE) 10 %  external solution Apply 1 Application topically daily. Left great toe Patient not taking: Reported on 05/10/2022    [provider]   Allergies  Allergen Reactions   Bee Venom Anaphylaxis   Review of Systems  Respiratory:  Positive for shortness of breath.     Physical Exam Pulmonary:     Comments: Work of breathing noted.  Congested cough. Neurological:     Mental Status: He is alert.     Vital Signs: BP 114/78 (BP Location: Right Arm)   Pulse 77   Temp 98.7 F (37.1 C) (Oral)   Resp 20   Ht 5\' 9"  (1.753 m)   Wt 64.4 kg   SpO2 92%   BMI 20.97 kg/m  Pain Scale: 0-10   Pain Score: 0-No pain   SpO2: SpO2: 92 % O2 Device:SpO2: 92 % O2 Flow Rate: .O2 Flow Rate  (L/min): 6 L/min  IO: Intake/output summary:  Intake/Output Summary (Last 24 hours) at 05/14/2022 1614 Last data filed at 05/14/2022 1538 Gross per 24 hour  Intake 385.3 ml  Output 500 ml  Net -114.7 ml    LBM: Last BM Date : 05/14/22 Baseline Weight: Weight: 64.4 kg Most recent weight: Weight: 64.4 kg       Signed by: Morton Stall, NP   Please contact Palliative Medicine Team phone at [phone] for questions and concerns.  For individual provider: See Loretha Stapler

## 2022-05-15 DIAGNOSIS — J189 Pneumonia, unspecified organism: Secondary | ICD-10-CM | POA: Diagnosis not present

## 2022-05-15 DIAGNOSIS — Z7189 Other specified counseling: Secondary | ICD-10-CM | POA: Diagnosis not present

## 2022-05-15 LAB — CBC WITH DIFFERENTIAL/PLATELET
Abs Immature Granulocytes: 0.12 10*3/uL — ABNORMAL HIGH (ref 0.00–0.07)
Basophils Absolute: 0 10*3/uL (ref 0.0–0.1)
Basophils Relative: 0 %
Eosinophils Absolute: 0.1 10*3/uL (ref 0.0–0.5)
Eosinophils Relative: 1 %
HCT: 29 % — ABNORMAL LOW (ref 39.0–52.0)
Hemoglobin: 8.9 g/dL — ABNORMAL LOW (ref 13.0–17.0)
Immature Granulocytes: 1 %
Lymphocytes Relative: 12 %
Lymphs Abs: 1.5 10*3/uL (ref 0.7–4.0)
MCH: 29.3 pg (ref 26.0–34.0)
MCHC: 30.7 g/dL (ref 30.0–36.0)
MCV: 95.4 fL (ref 80.0–100.0)
Monocytes Absolute: 1 10*3/uL (ref 0.1–1.0)
Monocytes Relative: 8 %
Neutro Abs: 10.5 10*3/uL — ABNORMAL HIGH (ref 1.7–7.7)
Neutrophils Relative %: 78 %
Platelets: 362 10*3/uL (ref 150–400)
RBC: 3.04 MIL/uL — ABNORMAL LOW (ref 4.22–5.81)
RDW: 15.8 % — ABNORMAL HIGH (ref 11.5–15.5)
WBC: 13.3 10*3/uL — ABNORMAL HIGH (ref 4.0–10.5)
nRBC: 0 % (ref 0.0–0.2)

## 2022-05-15 LAB — BASIC METABOLIC PANEL
Anion gap: 7 (ref 5–15)
BUN: 45 mg/dL — ABNORMAL HIGH (ref 8–23)
CO2: 30 mmol/L (ref 22–32)
Calcium: 8.5 mg/dL — ABNORMAL LOW (ref 8.9–10.3)
Chloride: 110 mmol/L (ref 98–111)
Creatinine, Ser: 1.35 mg/dL — ABNORMAL HIGH (ref 0.61–1.24)
GFR, Estimated: 50 mL/min — ABNORMAL LOW (ref >60)
Glucose, Bld: 88 mg/dL (ref 70–99)
Potassium: 3.7 mmol/L (ref 3.5–5.1)
Sodium: 147 mmol/L — ABNORMAL HIGH (ref 135–145)

## 2022-05-15 LAB — MAGNESIUM: Magnesium: 2.3 mg/dL (ref 1.7–2.4)

## 2022-05-15 LAB — CULTURE, BLOOD (ROUTINE X 2): Special Requests: ADEQUATE

## 2022-05-15 MED ORDER — LEVOFLOXACIN 750 MG PO TABS
750.0000 mg | ORAL_TABLET | ORAL | Status: AC
  Administered 2022-05-16: 750 mg via ORAL
  Filled 2022-05-15: qty 1

## 2022-05-15 MED ORDER — BISACODYL 10 MG RE SUPP: 10.0000 mg | Freq: Every day | RECTAL | Status: DC | PRN

## 2022-05-15 MED ORDER — HYDROCORTISONE SOD SUC (PF) 100 MG IJ SOLR
50.0000 mg | Freq: Three times a day (TID) | INTRAMUSCULAR | Status: DC
  Administered 2022-05-15 – 2022-05-16 (×3): 50 mg via INTRAVENOUS
  Filled 2022-05-15 (×4): qty 1

## 2022-05-15 MED ORDER — GUAIFENESIN ER 600 MG PO TB12
1200.0000 mg | ORAL_TABLET | Freq: Two times a day (BID) | ORAL | Status: DC
  Administered 2022-05-15 – 2022-05-20 (×12): 1200 mg via ORAL
  Filled 2022-05-15 (×14): qty 2

## 2022-05-15 NOTE — Progress Notes (Signed)
PT Cancellation Note  Patient Details Name: [name] MRN: [id number] DOB: 1928/12/09   Cancelled Treatment:    Reason Eval/Treat Not Completed: Other (comment). Pt's chart reviewed. Per EMR pt to d/c home with hospice care. Appears pt will have needed DME at time of discharge. Please re-consult if PT needs are identified. PT to sign off.    Delphia Grates. Fairly IV, PT, DPT Physical Therapist- [place]  [facility]  05/15/2022, 3:53 PM

## 2022-05-15 NOTE — Progress Notes (Signed)
Occupational Therapy Treatment Patient Details Name: [name] MRN: [id number] DOB: 10-24-1928 Today's Date: 05/15/2022   History of present illness Pt is a 89 y/o M admitted on 05/10/22 after presenting with c/o SOB, easy fatigability, poor oral intake & constipation. Pt is being treated for LLL PNA & acute on chronic respiratory failure. PMH: L great toe amputation 2/2 osteomyelitis, chronic diastolic CHF, PVD, CAD, R carotid artery stenosis, aortic stenosis, R BBB, aspiration PNA, COPD, chronic hypoxic respiratory failure on 4L O2, a-fib, anemia   OT comments  Mr. Purdy was seen for OT treatment on this date. Upon arrival to room pt  semi-fowlers in bed, denies pain, and agreeable to OT tx session however adamantly declines OOB/EOB activity 2/2 increased fatigue from "being up in that chair for an hour this morning". Pt educated on benefits for functional activity during hospital stay and strategies to maximize safety and independence during ADL management. Extended time taken to review pursed lipped breathing and activity pacing as energy conservation strategies. Pt able to return demo safe use of incentive spirometer. Attempted UB therapeutic exercise however, pt endorses fatigue and SOB. Able to perform bilat ceiling punches x10. Educated on additional low level exercises as described below. Pt continues to benefit from skilled OT services to maximize return to PLOF and minimize risk of future falls, injury, caregiver burden, and readmission. Will continue to follow POC. Continue to anticipate the need for follow up OT services upon hospital DC.     Recommendations for follow up therapy are one component of a multi-disciplinary discharge planning process, led by the attending physician.  Recommendations may be updated based on patient status, additional functional criteria and insurance authorization.    Assistance Recommended at Discharge Intermittent Supervision/Assistance  Patient  can return home with the following  A little help with walking and/or transfers;A little help with bathing/dressing/bathroom;Assistance with cooking/housework;Assist for transportation;Help with stairs or ramp for entrance   Equipment Recommendations   (Defer)    Recommendations for Other Services      Precautions / Restrictions Precautions Precautions: Fall Restrictions Weight Bearing Restrictions: Yes LLE Weight Bearing:  (heel weight bearing in post-op shoe per podiatry)       Mobility Bed Mobility Overal bed mobility: Needs Assistance             General bed mobility comments: Deferred. Pt adamantly declines functional mobility despite education.    Transfers                         Balance                                           ADL either performed or assessed with clinical judgement   ADL                                         General ADL Comments: Pt able to perform UB ADL management at bed level with SET UP assist. He adamantly declines OOB/EOB activity despite education 2/2 fatigue this date. Education provided on energy conservation strategies, incentive spirometry use, and UB therapeutic exercise during session.    Extremity/Trunk Assessment Upper Extremity Assessment Upper Extremity Assessment: Generalized weakness   Lower Extremity Assessment Lower Extremity Assessment: Generalized weakness  Vision Patient Visual Report: No change from baseline     Perception     Praxis      Cognition Arousal/Alertness: Awake/alert Behavior During Therapy: WFL for tasks assessed/performed Overall Cognitive Status: Within Functional Limits for tasks assessed                                          Exercises Other Exercises Other Exercises: Pt educated on UE therapeutic exercises including celing punches, bicep curls, and wrist flexion/extension to perform while resting. Pt generally  fatigued/SOB with exercises, also educated on energy conservation strategies with emphasis on pursed lipped breathing and activity pacing during session. Continues to benefit from reinforcement of education.    Shoulder Instructions       General Comments      Pertinent Vitals/ Pain       Pain Assessment Pain Assessment: No/denies pain  Home Living                                          Prior Functioning/Environment              Frequency  Min 2X/week        Progress Toward Goals  OT Goals(current goals can now be found in the care plan section)  Progress towards OT goals: Progressing toward goals  Acute Rehab OT Goals Patient Stated Goal: to get better OT Goal Formulation: With patient Time For Goal Achievement: 05/26/22 Potential to Achieve Goals: Fair  Plan Discharge plan remains appropriate;Frequency remains appropriate    Co-evaluation                 AM-PAC OT "6 Clicks" Daily Activity     Outcome Measure   Help from another person eating meals?: None Help from another person taking care of personal grooming?: A Little Help from another person toileting, which includes using toliet, bedpan, or urinal?: A Lot Help from another person bathing (including washing, rinsing, drying)?: A Little Help from another person to put on and taking off regular upper body clothing?: A Little Help from another person to put on and taking off regular lower body clothing?: A Little 6 Click Score: 18    End of Session Equipment Utilized During Treatment: Rolling walker (2 wheels);Oxygen  OT Visit Diagnosis: Muscle weakness (generalized) (M62.81);Unsteadiness on feet (R26.81)   Activity Tolerance Patient limited by fatigue   Patient Left in bed;with call bell/phone within reach;with bed alarm set   Nurse Communication Mobility status        Time: 1610-9604 OT Time Calculation (min): 15 min  Charges: OT General Charges $OT Visit: 1  Visit OT Treatments $Self Care/Home Management : 8-22 mins  Rockney Ghee, M.S., OTR/L 05/15/22, 12:36 PM

## 2022-05-15 NOTE — TOC Progression Note (Signed)
Transition of Care [facility]) - Progression Note    Patient Details  Name: [name] MRN: [id number] Date of Birth: 1928-10-01  Transition of Care [facility]) CM/SW Contact  Allena Katz, LCSW Phone Number: 05/15/2022, 4:17 PM  Clinical Narrative:   CSW spoke with patient about getting assistance at home for home hospice. Pt states she has a friend who can come in and assist but is just unsure how often this can happen. CSW spoke with patient about getting PCS services. She reports she would like the information for the agencies but does not want them to reach out to her until she gets a plan for who is going to assist at home.          Expected Discharge Plan and Services                                               Social Determinants of Health (SDOH) Interventions SDOH Screenings   Food Insecurity: No Food Insecurity (05/10/2022)  Housing: Low Risk  (05/10/2022)  Transportation Needs: No Transportation Needs (05/10/2022)  Utilities: Not At Risk (05/10/2022)  Tobacco Use: Medium Risk (05/10/2022)    Readmission Risk Interventions    10/10/2019    4:22 PM  Readmission Risk Prevention Plan  Transportation Screening Complete  PCP or Specialist Appt within 3-5 Days Complete  HRI or Home Care Consult Complete  Social Work Consult for Recovery Care Planning/Counseling Complete  Palliative Care Screening Complete  Medication Review Oceanographer) Complete

## 2022-05-15 NOTE — Telephone Encounter (Signed)
Called made

## 2022-05-15 NOTE — Progress Notes (Signed)
PHARMACY NOTE:  ANTIMICROBIAL RENAL DOSAGE ADJUSTMENT  Current antimicrobial regimen includes a mismatch between antimicrobial dosage and estimated renal function.  As per policy approved by the Pharmacy & Therapeutics and Medical Executive Committees, the antimicrobial dosage will be adjusted accordingly.  Current antimicrobial dosage:  Levofloxacin 500 mg PO every 48 hours  Indication: CAP, osteomyelitis  Renal Function: Renal function improved   Estimated Creatinine Clearance: 32.5 mL/min (A) (by C-G formula based on SCr of 1.35 mg/dL (H)).  Antimicrobial dosage has been changed to:  Levofloxacin 750 mg PO every 48 hours  Additional comments:  Thank you for allowing pharmacy to be a part of this patient's care.   Elliot Gurney, PharmD, BCPS Clinical Pharmacist  05/15/2022 10:58 AM

## 2022-05-15 NOTE — Progress Notes (Signed)
Daily Progress Note   Patient Name: [name]       Date: 05/15/2022 DOB: 1928/12/30  Age: 89 y.o. MRN#: [id number] Attending Physician: Tresa Moore, MD Primary Care Physician: Gracelyn Nurse, MD Admit Date: 05/10/2022  Reason for Consultation/Follow-up: Establishing goals of care  Subjective: Notes and labs reviewed.  In to see patient.  Daughter called during my presence, and stated that she wanted me to speak with him and then call her later to discuss their conversation yesterday as compared to my conversation.  Patient states he is unsure of what to do.  He understands the risk and benefits of amputation and comfort focused care.  He states he will need to speak with his daughter further.  Spoke with daughter.  She states that per their current conversation, patient would like to come home with hospice at this time.  Questions answered as able.  She states she would like a hospice agency that has an option for hospice facility placement.  Length of Stay: 4  Current Medications: Scheduled Meds:   albuterol  2.5 mg Nebulization TID   apixaban  2.5 mg Oral BID   aspirin EC  81 mg Oral Q0600   atorvastatin  40 mg Oral QHS   diltiazem  300 mg Oral Daily   feeding supplement  237 mL Oral BID BM   fluticasone furoate-vilanterol  1 puff Inhalation Daily   And   umeclidinium bromide  1 puff Inhalation Daily   guaiFENesin  1,200 mg Oral BID   hydrocortisone sod succinate (SOLU-CORTEF) inj  50 mg Intravenous Q8H   [START ON 05/16/2022] levofloxacin  750 mg Oral Q48H   metoprolol tartrate  50 mg Oral BID   metroNIDAZOLE  500 mg Oral Q12H   multivitamin with minerals  1 tablet Oral Daily   nystatin  5 mL Mouth/Throat QID   polyethylene glycol  17 g Oral Daily    senna-docusate  2 tablet Oral BID   Vitamin D (Ergocalciferol)  50,000 Units Oral Q7 days    Continuous Infusions:   PRN Meds: acetaminophen, bisacodyl, HYDROcodone-acetaminophen, hydrOXYzine, levalbuterol  Physical Exam Pulmonary:     Comments: Some work of breathing noted Neurological:     Mental Status: He is alert.  Vital Signs: BP (!) 115/41 (BP Location: Right Wrist)   Pulse 72   Temp 98.4 F (36.9 C)   Resp 18   Ht 5\' 9"  (1.753 m)   Wt 64.4 kg   SpO2 93%   BMI 20.97 kg/m  SpO2: SpO2: 93 % O2 Device: O2 Device: Nasal Cannula O2 Flow Rate: O2 Flow Rate (L/min): 5 L/min  Intake/output summary:  Intake/Output Summary (Last 24 hours) at 05/15/2022 1349 Last data filed at 05/14/2022 1538 Gross per 24 hour  Intake 385.3 ml  Output --  Net 385.3 ml   LBM: Last BM Date : 05/15/22 Baseline Weight: Weight: 64.4 kg Most recent weight: Weight: 64.4 kg    Patient Active Problem List   Diagnosis Date Noted   Gangrene of left foot 05/14/2022   AKI (acute kidney injury) 05/14/2022   Community acquired pneumonia 05/10/2022   Elevated troponin 05/10/2022   Chronic kidney disease, stage 3a 03/29/2022   Myocardial injury 03/28/2022   Hypokalemia 03/28/2022   Symptomatic anemia 03/28/2022   Acute blood loss anemia 03/27/2022   S/P right carotid endarterectomy 03/18/22 03/27/2022   H/O left central retinal artery occlusion 03/01/22 03/27/2022   Arm wound, right, subsequent encounter 03/27/2022   Frailty 03/27/2022   Abnormal urinalysis 03/27/2022   Chronic respiratory failure with hypoxia 03/27/2022   Carotid stenosis, right 03/18/2022   Carotid stenosis 03/08/2022   PVD (peripheral vascular disease) 03/08/2022   Acute loss of vision 02/28/2022   Venous ulcer of ankle, right 09/04/2020   Protein-calorie malnutrition, severe 08/10/2020   Atherosclerosis of artery of extremity with ulceration 08/09/2020   Atherosclerosis of native arteries of the extremities  with ulceration 06/30/2020   Acute on chronic respiratory failure with hypoxia 10/08/2019   Chronic diastolic CHF (congestive heart failure) 10/08/2019   COPD (chronic obstructive pulmonary disease)    Aspiration pneumonia    Sepsis    Closed fracture of sternum    Compression fracture of thoracic vertebra    Nonthrombocytopenic purpura 02/08/2019   Acute on chronic diastolic CHF (congestive heart failure) 01/27/2019   Acute respiratory failure with hypoxia 01/24/2019   Pneumonia due to COVID-19 virus 01/24/2019   Atrial fibrillation, chronic 01/24/2019   Atrial fibrillation with RVR 01/04/2019   Constipation 01/04/2019   HLD (hyperlipidemia) 01/04/2019   Hypertension    Coronary artery disease    COVID-19     Palliative Care Assessment & Plan   Recommendations/Plan: Home with hospice  Code Status:    Code Status Orders  (From admission, onward)           Start     Ordered   05/10/22 1813  Do not attempt resuscitation (DNR)  Continuous       Question Answer Comment  If patient has no pulse and is not breathing Do Not Attempt Resuscitation   If patient has a pulse and/or is breathing: Medical Treatment Goals LIMITED ADDITIONAL INTERVENTIONS: Use medication/IV fluids and cardiac monitoring as indicated; Do not use intubation or mechanical ventilation (DNI), also provide comfort medications.  Transfer to Progressive/Stepdown as indicated, avoid Intensive Care.   Consent: Discussion documented in EHR or advanced directives reviewed      05/10/22 1815           Code Status History     Date Active Date Inactive Code Status Order ID Comments User Context   05/03/2022 1239 05/03/2022 1847 Full Code [id number]  Linus Galas, [facility] Inpatient   03/28/2022 0202 03/29/2022 2036 DNR [id number]  Para March,  Odetta Pink, MD Inpatient   03/27/2022 2125 03/28/2022 0202 DNR [id number]  Andris Baumann, MD ED   03/18/2022 1435 03/19/2022 2340 DNR [id number]  Annice Needy, MD Inpatient   03/18/2022 1251  03/18/2022 1435 Full Code [id number]  Annice Needy, MD Inpatient   02/28/2022 1704 03/01/2022 2318 DNR [id number]  Verdene Lennert, MD ED   08/09/2020 1151 08/11/2020 2324 Full Code [id number]  Annice Needy, MD Inpatient   10/08/2019 1757 10/15/2019 1558 DNR [id number]  Lorretta Harp, MD Inpatient   10/08/2019 1530 10/08/2019 1756 Full Code [id number]  Lorretta Harp, MD ED   01/24/2019 2203 01/31/2019 2147 DNR [id number]  Andris Baumann, MD ED   01/24/2019 2145 01/24/2019 2202 Full Code [id number]  Andris Baumann, MD ED   01/04/2019 1926 01/08/2019 1606 Full Code [id number]  Lorretta Harp, MD ED      Advance Directive Documentation    Flowsheet Row Most Recent Value  Type of Advance Directive Out of facility DNR (pink MOST or yellow form)  Pre-existing out of facility DNR order (yellow form or pink MOST form) Yellow form placed in chart (order not valid for inpatient use)  "MOST" Form in Place? --       Prognosis:  < 6 months    Care plan was discussed with attending via epic chat.  Order [facility] to [facility].  Thank you for allowing the Palliative Medicine Team to assist in the care of this patient.   Morton Stall, NP  Please contact Palliative Medicine Team phone at [phone] for questions and concerns.

## 2022-05-15 NOTE — Progress Notes (Signed)
\ PROGRESS NOTE    [name]  WJX:[id number] DOB: [birth year] DOA: 05/10/2022 PCP: Gracelyn Nurse, MD    Brief Narrative:  89 y.o. male multiple medical problems including but not limited to recent left great toe amputation for osteomyelitis left great toe, chronic diastolic CHF, PVD, CAD, right carotid artery stenosis, aortic stenosis, right bundle branch block, history of COVID-19 infection, history of aspiration pneumonia, COPD, chronic hypoxic respiratory failure on 4 L oxygen, atrial fibrillation, anemia of chronic disease history was obtained from the patient and [name] (his daughter) at the bedside.  EMR chart was also reviewed.  He presented to the hospital because of increasing shortness of breath, easy fatigability, poor oral intake and constipation.  He has not been feeling well since he had left great toe amputation on 05/03/2022.  Because of easy fatigability and exertional shortness of breath he has mostly been sedentary.  He has not moved his bowels for about 8 days.  He has had a lot of nausea/dry heaving and vomiting mucus.    Assessment & Plan:   Principal Problem:   Community acquired pneumonia Active Problems:   Hypokalemia   Chronic diastolic CHF (congestive heart failure)   COPD (chronic obstructive pulmonary disease)   Chronic respiratory failure with hypoxia   Atrial fibrillation with RVR   Coronary artery disease   Constipation   Acute on chronic diastolic CHF (congestive heart failure)   PVD (peripheral vascular disease)   Elevated troponin   Gangrene of left foot   AKI (acute kidney injury)  Left lower lobe pneumonia: No growth on blood cultures thus far.  Continue Levaquin and Flagyl.  Strep pneumo and Legionella urine antigens were negative.    Added IV Solu-Cortef 50 mg every 8 hours on 4/17     Acute on chronic hypoxic respiratory failure: He is on 6 L/min oxygen via [place].    He uses 4 L/min oxygen at baseline.  Steroids as above.  Wean as tolerated.   As needed bronchodilators     Hypokalemia: Improved     Worsening AKI: Creatinine went up to 1.56.  Now improving.  Holding torsemide.  No IV fluids at this time     Elevated troponins: No chest pain.  This is likely due to demand ischemia from infection.       Constipation: Improved.  He has moved his bowels. No acute abnormality or bowel obstruction noted on CT abdomen.   Continue bowel regimen     Acute on chronic diastolic CHF: Chest ray done on 05/12/2022 showed increased bilateral pulmonary edema with right-sided pleural effusions.  Torsemide has been held.  No IV fluids.     COPD with chronic hypoxic respiratory failure: Provides hydrocortisone 50 mg every 8 hours.  Hold home prednisone.  Continue bronchodilator regimen.     Atrial fibrillation with RVR: Continue metoprolol, Cardizem and Eliquis     PVD, recent left great toe osteomyelitis s/p left great toe amputation on 05/03/2022: Wound culture showed Pseudomonas aeruginosa infection.  He was on ciprofloxacin and Augmentin at home (to be completed on 05/16/2022). Consulted Dr. Alberteen Spindle, podiatrist.  He said the recent left hallux amputation is not healing and foot is starting to appear gangrenous.  He had discussed this with Dr. Wyn Quaker, vascular surgeon, who said the only option would be to do a high level amputation.  Unfortunately, patient is at very high risk for complications for surgery.  Poor surgical candidate at this time.  Prognosis is poor without intervention.  Unfortunately options very limited at this time.  Recommend de-escalation of care, symptom management, hospice referral.  Palliative care to follow-up.     Recent oral thrush prior to admission: Nystatin suspension has been reordered.   DVT prophylaxis: Eliquis Code Status: DNR Family Communication: Disposition Plan: Status is: Inpatient Remains inpatient appropriate because: Gangrene of foot.  Community-acquired pneumonia   Level of care:  Med-Surg  Consultants:  Palliative care  Procedures:  None  Antimicrobials: Levaquin Flagyl   Subjective: Seen and examined.  Sitting up in chair.  Appears winded and fatigued.  Conversational dyspnea noted.  Objective: Vitals:   05/15/22 0008 05/15/22 0438 05/15/22 0717 05/15/22 0737  BP: 114/66 119/71  (!) 115/41  Pulse: 85 95  72  Resp: 20 18  18   Temp: 98.2 F (36.8 C) 98.4 F (36.9 C)  98.4 F (36.9 C)  TempSrc: Oral Oral    SpO2: 96% (!) 87% 93% 93%  Weight:      Height:        Intake/Output Summary (Last 24 hours) at 05/15/2022 1312 Last data filed at 05/14/2022 1538 Gross per 24 hour  Intake 385.3 ml  Output --  Net 385.3 ml   Filed Weights   05/10/22 1251  Weight: 64.4 kg    Examination:  General exam: Appears fatigued.  Appears chronically ill thin Respiratory system: Tachypneic.  Coarse breath sounds bilaterally.  6 L Cardiovascular system: S1-S2, RRR, no murmurs, no pedal edema Gastrointestinal system: Soft, NT/ND, normal bowel sounds Central nervous system: Alert and oriented. No focal neurological deficits. Extremities: Symmetrically decreased bilaterally Skin: No rashes, lesions or ulcers Psychiatry: Judgement and insight appear normal. Mood & affect appropriate.     Data Reviewed: I have personally reviewed following labs and imaging studies  CBC: Recent Labs  Lab 05/10/22 1529 05/13/22 0335 05/14/22 0502 05/15/22 0614  WBC 10.5 14.7* 15.2* 13.3*  NEUTROABS 8.1* 11.5* 12.4* 10.5*  HGB 8.8* 8.6* 9.2* 8.9*  HCT 27.7* 27.7* 29.7* 29.0*  MCV 93.9 94.9 94.9 95.4  PLT 295 309 372 362   Basic Metabolic Panel: Recent Labs  Lab 05/10/22 1529 05/11/22 0547 05/13/22 0335 05/14/22 0502 05/15/22 0614  NA 139 141 147* 150* 147*  K 2.7* 3.3* 3.4* 4.0 3.7  CL 96* 101 106 108 110  CO2 30 29 32 33* 30  GLUCOSE 99 85 108* 110* 88  BUN 22 23 39* 43* 45*  CREATININE 1.02 1.04 1.31* 1.56* 1.35*  CALCIUM 8.5* 8.3* 8.3* 8.4* 8.5*  MG 1.7  2.2 2.0 2.2 2.3   GFR: Estimated Creatinine Clearance: 32.5 mL/min (A) (by C-G formula based on SCr of 1.35 mg/dL (H)). Liver Function Tests: Recent Labs  Lab 05/10/22 1529  AST 38  ALT 15  ALKPHOS 66  BILITOT 0.6  PROT 6.8  ALBUMIN 2.9*   No results for input(s): "LIPASE", "AMYLASE" in the last 168 hours. No results for input(s): "AMMONIA" in the last 168 hours. Coagulation Profile: No results for input(s): "INR", "PROTIME" in the last 168 hours. Cardiac Enzymes: No results for input(s): "CKTOTAL", "CKMB", "CKMBINDEX", "TROPONINI" in the last 168 hours. BNP (last 3 results) No results for input(s): "PROBNP" in the last 8760 hours. HbA1C: No results for input(s): "HGBA1C" in the last 72 hours. CBG: Recent Labs  Lab 05/12/22 0144  GLUCAP 142*   Lipid Profile: No results for input(s): "CHOL", "HDL", "LDLCALC", "TRIG", "CHOLHDL", "LDLDIRECT" in the last 72 hours. Thyroid Function Tests: No results for input(s): "TSH", "T4TOTAL", "FREET4", "T3FREE", "THYROIDAB" in  the last 72 hours. Anemia Panel: No results for input(s): "VITAMINB12", "FOLATE", "FERRITIN", "TIBC", "IRON", "RETICCTPCT" in the last 72 hours. Sepsis Labs: Recent Labs  Lab 05/10/22 1529  LATICACIDVEN 1.3    Recent Results (from the past 240 hour(s))  Culture, blood (Routine X 2) w Reflex to ID Panel     Status: None   Collection Time: 05/10/22  8:45 PM   Specimen: BLOOD  Result Value Ref Range Status   Specimen Description BLOOD BLOOD LEFT HAND  Final   Special Requests   Final    BOTTLES DRAWN AEROBIC AND ANAEROBIC Blood Culture results may not be optimal due to an inadequate volume of blood received in culture bottles   Culture   Final    NO GROWTH 5 DAYS Performed at [facility], [address], [place], [place] [address]    Report Status 05/15/2022 FINAL  Final  Culture, blood (Routine X 2) w Reflex to ID Panel     Status: None   Collection Time: 05/10/22  8:45 PM   Specimen: BLOOD   Result Value Ref Range Status   Specimen Description BLOOD RIGHT ANTECUBITAL  Final   Special Requests   Final    BOTTLES DRAWN AEROBIC AND ANAEROBIC Blood Culture adequate volume   Culture   Final    NO GROWTH 5 DAYS Performed at [facility], [address], [place], [place] [address]    Report Status 05/15/2022 FINAL  Final         Radiology Studies: DG Abd 2 Views  Result Date: 05/13/2022 CLINICAL DATA:  Abdominal distension EXAM: ABDOMEN - 2 VIEW COMPARISON:  05/10/2022 FINDINGS: Nonobstructive bowel gas pattern with mildly prominent loops of both large and small bowel. There is no evidence of free air. Bibasilar consolidations and effusions. Advanced atherosclerotic vascular calcifications. Osseous demineralization with multiple known compression fractures. IMPRESSION: 1. Nonobstructive bowel gas pattern. 2. Bibasilar consolidations and effusions. Electronically Signed   By: Duanne Guess D.O.   On: 05/13/2022 18:51        Scheduled Meds:  albuterol  2.5 mg Nebulization TID   apixaban  2.5 mg Oral BID   aspirin EC  81 mg Oral Q0600   atorvastatin  40 mg Oral QHS   diltiazem  300 mg Oral Daily   feeding supplement  237 mL Oral BID BM   fluticasone furoate-vilanterol  1 puff Inhalation Daily   And   umeclidinium bromide  1 puff Inhalation Daily   guaiFENesin  1,200 mg Oral BID   hydrocortisone sod succinate (SOLU-CORTEF) inj  50 mg Intravenous Q8H   [START ON 05/16/2022] levofloxacin  750 mg Oral Q48H   metoprolol tartrate  50 mg Oral BID   metroNIDAZOLE  500 mg Oral Q12H   multivitamin with minerals  1 tablet Oral Daily   nystatin  5 mL Mouth/Throat QID   polyethylene glycol  17 g Oral Daily   senna-docusate  2 tablet Oral BID   Vitamin D (Ergocalciferol)  50,000 Units Oral Q7 days   Continuous Infusions:   LOS: 4 days     Tresa Moore, MD [facility]   If 7PM-7AM, please contact night-coverage  05/15/2022, 1:12 PM

## 2022-05-15 NOTE — Progress Notes (Addendum)
[facility] [address] [employer], contracting [facility] Liaison Note  Received request from Transitions of Care Manager, Allena Katz, for hospice services at home after discharge. Chart and patient information under review by [facility] physician.   Spoke with daughter, Gearald Stonebraker to initiate education related to hospice philosophy, services, and team approach to care.  Daughter verbalized understanding of information given. Per discussion, the plan is for patient to discharge home via EMS once cleared to DC.   Daughter stated that she needs some time to hire a private duty caregiver, and to do some rearranging of the home for the hospital bed. She also would like some personal care training from hospital staff before the patient is discharged. Daughter stated that she would like Dr. Georgeann Oppenheim to call her. Request relayed to attending MD and [facility] IDT.     DME needs discussed. Patient has the following equipment in the home:  [facility], Wheeled Walker and 02   Patient requests the following equipment for delivery:  Hospital Bed and 02 since 02 concentrator in the home only goes to 5L.    Address verified and is correct in the chart. Daughter is the family member to contact to arrange time of equipment delivery.    Please send signed and completed DNR home with patient/family. Please provide prescriptions at discharge as needed to ensure ongoing symptom management.    [facility] information and contact numbers given to family & above information shared with TOC.   Please call with any questions/concerns.    Thank you for the opportunity to participate in this patient's care.  [facility] Liaison

## 2022-05-15 NOTE — Progress Notes (Signed)
Pt up to [facility] this morning significant dyspnea and tachycardia. Unable to remove purewick at this time.

## 2022-05-15 NOTE — Progress Notes (Addendum)
Nutrition Brief Note  Chart reviewed. Palliative care following. Per notes today, pt would like to forego amputation and transition to comfort-focused care. Plan to discharge home with hospice vs residential hospice per daughter's request.  No further nutrition interventions planned at this time.  Please re-consult as needed.   Levada Schilling, RD, LDN, CDCES Registered Dietitian II Certified Diabetes Care and Education Specialist Please refer to [facility] : [facility] for RD and/or RD on-call/weekend/after hours pager

## 2022-05-16 ENCOUNTER — Ambulatory Visit (INDEPENDENT_AMBULATORY_CARE_PROVIDER_SITE_OTHER): Payer: Medicare Other | Admitting: Nurse Practitioner

## 2022-05-16 DIAGNOSIS — Z7189 Other specified counseling: Secondary | ICD-10-CM | POA: Diagnosis not present

## 2022-05-16 DIAGNOSIS — J189 Pneumonia, unspecified organism: Secondary | ICD-10-CM | POA: Diagnosis not present

## 2022-05-16 MED ORDER — ORAL CARE MOUTH RINSE: 15.0000 mL | OROMUCOSAL | Status: DC | PRN

## 2022-05-16 MED ORDER — METHYLPREDNISOLONE SODIUM SUCC 40 MG IJ SOLR
40.0000 mg | Freq: Two times a day (BID) | INTRAMUSCULAR | Status: DC
  Administered 2022-05-16 – 2022-05-21 (×11): 40 mg via INTRAVENOUS
  Filled 2022-05-16 (×11): qty 1

## 2022-05-16 NOTE — Progress Notes (Signed)
[facility] Liaison had a conversation with the daughter via phone a few minutes ago. She reported that she has family coming in this weekend to help get the house set up for the patient to return, so she is thinking he will return home sometime early next week.  She is refusing any DME delivery until the house is set up.  She stated that she informed the DME company that she will call them when she is ready for delivery.  TOC/Darrian notified of this information.   [facility] Liaison [phone]

## 2022-05-16 NOTE — Progress Notes (Signed)
\ PROGRESS NOTE    [name]  NWG:[id number] DOB: Dec 21, 1928 DOA: 05/10/2022 PCP: Gracelyn Nurse, MD    Brief Narrative:  89 y.o. male multiple medical problems including but not limited to recent left great toe amputation for osteomyelitis left great toe, chronic diastolic CHF, PVD, CAD, right carotid artery stenosis, aortic stenosis, right bundle branch block, history of COVID-19 infection, history of aspiration pneumonia, COPD, chronic hypoxic respiratory failure on 4 L oxygen, atrial fibrillation, anemia of chronic disease history was obtained from the patient and [name] (his daughter) at the bedside.  EMR chart was also reviewed.  He presented to the hospital because of increasing shortness of breath, easy fatigability, poor oral intake and constipation.  He has not been feeling well since he had left great toe amputation on 05/03/2022.  Because of easy fatigability and exertional shortness of breath he has mostly been sedentary.  He has not moved his bowels for about 8 days.  He has had a lot of nausea/dry heaving and vomiting mucus.    Assessment & Plan:   Principal Problem:   Community acquired pneumonia Active Problems:   Hypokalemia   Chronic diastolic CHF (congestive heart failure)   COPD (chronic obstructive pulmonary disease)   Chronic respiratory failure with hypoxia   Atrial fibrillation with RVR   Coronary artery disease   Constipation   Acute on chronic diastolic CHF (congestive heart failure)   PVD (peripheral vascular disease)   Elevated troponin   Gangrene of left foot   AKI (acute kidney injury)  Left lower lobe pneumonia: No growth on blood cultures thus far.  Continue Levaquin and Flagyl.  Strep pneumo and Legionella urine antigens were negative.    Discontinue Solu-Cortef.  Add Solu-Medrol 40 mg IV every 12 hours     Acute on chronic hypoxic respiratory failure: Improved.  Back to baseline 4 L.  Steroids as above.  As needed bronchodilators.       Hypokalemia: Improved     Worsening AKI: Creatinine went up to 1.56.  Now improving.  Holding torsemide.  No IV fluids at this time     Elevated troponins: No chest pain.  This is likely due to demand ischemia from infection.       Constipation: Improved.  He has moved his bowels. No acute abnormality or bowel obstruction noted on CT abdomen.   Continue bowel regimen     Acute on chronic diastolic CHF: Chest ray done on 05/12/2022 showed increased bilateral pulmonary edema with right-sided pleural effusions.  Torsemide has been held.  No IV fluids.     COPD with chronic hypoxic respiratory failure: Provides hydrocortisone 50 mg every 8 hours.  Hold home prednisone.  Continue bronchodilator regimen.     Atrial fibrillation with RVR: Continue metoprolol, Cardizem and Eliquis     PVD, recent left great toe osteomyelitis s/p left great toe amputation on 05/03/2022: Wound culture showed Pseudomonas aeruginosa infection.  He was on ciprofloxacin and Augmentin at home (to be completed on 05/16/2022). Consulted Dr. Alberteen Spindle, podiatrist.  He said the recent left hallux amputation is not healing and foot is starting to appear gangrenous.  He had discussed this with Dr. Wyn Quaker, vascular surgeon, who said the only option would be to do a high level amputation.  Unfortunately, patient is at very high risk for complications for surgery.  Poor surgical candidate at this time.  Prognosis is poor without intervention.  Unfortunately options very limited at this time.  Recommend de-escalation of care,  symptom management, hospice referral.  Palliative care to follow-up. Current plan of care is to discharge home with hospice services after daughter has time to arrange all DME delivery.     Recent oral thrush prior to admission: Nystatin suspension has been reordered.   DVT prophylaxis: Eliquis Code Status: DNR Family Communication: Daughter [name] [phone] via phone 4/17, 4/18 Disposition Plan: Status is:  Inpatient Remains inpatient appropriate because: Gangrene of foot.  Community-acquired pneumonia   Level of care: Med-Surg  Consultants:  Palliative care  Procedures:  None  Antimicrobials: Levaquin Flagyl   Subjective: Seen and examined.  Sitting up in chair.  Appears winded and fatigued.  Conversational dyspnea noted.  Objective: Vitals:   05/16/22 0008 05/16/22 0416 05/16/22 0735 05/16/22 0804  BP: 133/78 120/81 115/70   Pulse: 88 97    Resp: 20 20 (!) 24   Temp: 97.8 F (36.6 C) 97.7 F (36.5 C) (!) 97.5 F (36.4 C)   TempSrc:      SpO2: 94% 96% 95% 94%  Weight:      Height:        Intake/Output Summary (Last 24 hours) at 05/16/2022 1259 Last data filed at 05/16/2022 1053 Gross per 24 hour  Intake 1120 ml  Output 850 ml  Net 270 ml   Filed Weights   05/10/22 1251  Weight: 64.4 kg    Examination:  General exam: Appears fatigued.  Appears chronically ill Respiratory system: Conversational dyspnea.  Coarse breath sounds.  4 L Cardiovascular system: S1-S2, RRR, no murmurs, no pedal edema Gastrointestinal system: Soft, NT/ND, normal bowel sounds Central nervous system: Alert and oriented. No focal neurological deficits. Extremities: Symmetrically decreased bilaterally Skin: No rashes, lesions or ulcers Psychiatry: Judgement and insight appear normal. Mood & affect appropriate.     Data Reviewed: I have personally reviewed following labs and imaging studies  CBC: Recent Labs  Lab 05/10/22 1529 05/13/22 0335 05/14/22 0502 05/15/22 0614  WBC 10.5 14.7* 15.2* 13.3*  NEUTROABS 8.1* 11.5* 12.4* 10.5*  HGB 8.8* 8.6* 9.2* 8.9*  HCT 27.7* 27.7* 29.7* 29.0*  MCV 93.9 94.9 94.9 95.4  PLT 295 309 372 362   Basic Metabolic Panel: Recent Labs  Lab 05/10/22 1529 05/11/22 0547 05/13/22 0335 05/14/22 0502 05/15/22 0614  NA 139 141 147* 150* 147*  K 2.7* 3.3* 3.4* 4.0 3.7  CL 96* 101 106 108 110  CO2 30 29 32 33* 30  GLUCOSE 99 85 108* 110* 88  BUN  22 23 39* 43* 45*  CREATININE 1.02 1.04 1.31* 1.56* 1.35*  CALCIUM 8.5* 8.3* 8.3* 8.4* 8.5*  MG 1.7 2.2 2.0 2.2 2.3   GFR: Estimated Creatinine Clearance: 32.5 mL/min (A) (by C-G formula based on SCr of 1.35 mg/dL (H)). Liver Function Tests: Recent Labs  Lab 05/10/22 1529  AST 38  ALT 15  ALKPHOS 66  BILITOT 0.6  PROT 6.8  ALBUMIN 2.9*   No results for input(s): "LIPASE", "AMYLASE" in the last 168 hours. No results for input(s): "AMMONIA" in the last 168 hours. Coagulation Profile: No results for input(s): "INR", "PROTIME" in the last 168 hours. Cardiac Enzymes: No results for input(s): "CKTOTAL", "CKMB", "CKMBINDEX", "TROPONINI" in the last 168 hours. BNP (last 3 results) No results for input(s): "PROBNP" in the last 8760 hours. HbA1C: No results for input(s): "HGBA1C" in the last 72 hours. CBG: Recent Labs  Lab 05/12/22 0144  GLUCAP 142*   Lipid Profile: No results for input(s): "CHOL", "HDL", "LDLCALC", "TRIG", "CHOLHDL", "LDLDIRECT" in the last  72 hours. Thyroid Function Tests: No results for input(s): "TSH", "T4TOTAL", "FREET4", "T3FREE", "THYROIDAB" in the last 72 hours. Anemia Panel: No results for input(s): "VITAMINB12", "FOLATE", "FERRITIN", "TIBC", "IRON", "RETICCTPCT" in the last 72 hours. Sepsis Labs: Recent Labs  Lab 05/10/22 1529  LATICACIDVEN 1.3    Recent Results (from the past 240 hour(s))  Culture, blood (Routine X 2) w Reflex to ID Panel     Status: None   Collection Time: 05/10/22  8:45 PM   Specimen: BLOOD  Result Value Ref Range Status   Specimen Description BLOOD BLOOD LEFT HAND  Final   Special Requests   Final    BOTTLES DRAWN AEROBIC AND ANAEROBIC Blood Culture results may not be optimal due to an inadequate volume of blood received in culture bottles   Culture   Final    NO GROWTH 5 DAYS Performed at [facility], [address], [place], [place] [address]    Report Status 05/15/2022 FINAL  Final  Culture, blood (Routine  X 2) w Reflex to ID Panel     Status: None   Collection Time: 05/10/22  8:45 PM   Specimen: BLOOD  Result Value Ref Range Status   Specimen Description BLOOD RIGHT ANTECUBITAL  Final   Special Requests   Final    BOTTLES DRAWN AEROBIC AND ANAEROBIC Blood Culture adequate volume   Culture   Final    NO GROWTH 5 DAYS Performed at [facility], [address], [place], [place] [address]    Report Status 05/15/2022 FINAL  Final         Radiology Studies: No results found.      Scheduled Meds:  albuterol  2.5 mg Nebulization TID   apixaban  2.5 mg Oral BID   aspirin EC  81 mg Oral Q0600   atorvastatin  40 mg Oral QHS   diltiazem  300 mg Oral Daily   feeding supplement  237 mL Oral BID BM   fluticasone furoate-vilanterol  1 puff Inhalation Daily   And   umeclidinium bromide  1 puff Inhalation Daily   guaiFENesin  1,200 mg Oral BID   levofloxacin  750 mg Oral Q48H   methylPREDNISolone (SOLU-MEDROL) injection  40 mg Intravenous Q12H   metoprolol tartrate  50 mg Oral BID   metroNIDAZOLE  500 mg Oral Q12H   multivitamin with minerals  1 tablet Oral Daily   nystatin  5 mL Mouth/Throat QID   polyethylene glycol  17 g Oral Daily   senna-docusate  2 tablet Oral BID   Vitamin D (Ergocalciferol)  50,000 Units Oral Q7 days   Continuous Infusions:   LOS: 5 days     Christopher Moore, MD [facility]   If 7PM-7AM, please contact night-coverage  05/16/2022, 12:59 PM

## 2022-05-16 NOTE — Progress Notes (Signed)
Daily Progress Note   Patient Name: [name]       Date: 05/16/2022 DOB: 12/07/1928  Age: 89 y.o. MRN#: [id number] Attending Physician: Tresa Moore, MD Primary Care Physician: Gracelyn Nurse, MD Admit Date: 05/10/2022  Reason for Consultation/Follow-up: Establishing goals of care  Subjective: Notes reviewed.  Plans in place to discharge patient home with hospice this week.  In to see patient.  He is currently standing at bedside with staff members, moving from the hospital bed to bedside chair. He denies complaint. Plans in place for D/C with hospice.   Length of Stay: 5  Current Medications: Scheduled Meds:   albuterol  2.5 mg Nebulization TID   apixaban  2.5 mg Oral BID   aspirin EC  81 mg Oral Q0600   atorvastatin  40 mg Oral QHS   diltiazem  300 mg Oral Daily   feeding supplement  237 mL Oral BID BM   fluticasone furoate-vilanterol  1 puff Inhalation Daily   And   umeclidinium bromide  1 puff Inhalation Daily   guaiFENesin  1,200 mg Oral BID   levofloxacin  750 mg Oral Q48H   methylPREDNISolone (SOLU-MEDROL) injection  40 mg Intravenous Q12H   metoprolol tartrate  50 mg Oral BID   metroNIDAZOLE  500 mg Oral Q12H   multivitamin with minerals  1 tablet Oral Daily   nystatin  5 mL Mouth/Throat QID   polyethylene glycol  17 g Oral Daily   senna-docusate  2 tablet Oral BID   Vitamin D (Ergocalciferol)  50,000 Units Oral Q7 days    Continuous Infusions:   PRN Meds: acetaminophen, bisacodyl, HYDROcodone-acetaminophen, hydrOXYzine, levalbuterol, mouth rinse  Physical Exam Pulmonary:     Effort: Pulmonary effort is normal.  Neurological:     Mental Status: He is alert.             Vital Signs: BP 115/70 (BP Location: Left Arm)   Pulse 97   Temp (!)  97.5 F (36.4 C)   Resp (!) 24   Ht  (1.753 m)   Wt 64.4 kg   SpO2 94%   BMI 20.97 kg/m  SpO2: SpO2: 94 % O2 Device: O2 Device: Nasal Cannula O2 Flow Rate: O2 Flow Rate (L/min): 4 L/min  Intake/output summary:  Intake/Output Summary (Last 24 hours)  at 05/16/2022 1300 Last data filed at 05/16/2022 1053 Gross per 24 hour  Intake 1120 ml  Output 850 ml  Net 270 ml   LBM: Last BM Date : 05/12/22 Baseline Weight: Weight: 64.4 kg Most recent weight: Weight: 64.4 kg        Patient Active Problem List   Diagnosis Date Noted   Gangrene of left foot 05/14/2022   AKI (acute kidney injury) 05/14/2022   Community acquired pneumonia 05/10/2022   Elevated troponin 05/10/2022   Chronic kidney disease, stage 3a 03/29/2022   Myocardial injury 03/28/2022   Hypokalemia 03/28/2022   Symptomatic anemia 03/28/2022   Acute blood loss anemia 03/27/2022   S/P right carotid endarterectomy 03/18/22 03/27/2022   H/O left central retinal artery occlusion 03/01/22 03/27/2022   Arm wound, right, subsequent encounter 03/27/2022   Frailty 03/27/2022   Abnormal urinalysis 03/27/2022   Chronic respiratory failure with hypoxia 03/27/2022   Carotid stenosis, right 03/18/2022   Carotid stenosis 03/08/2022   PVD (peripheral vascular disease) 03/08/2022   Acute loss of vision 02/28/2022   Venous ulcer of ankle, right 09/04/2020   Protein-calorie malnutrition, severe 08/10/2020   Atherosclerosis of artery of extremity with ulceration 08/09/2020   Atherosclerosis of native arteries of the extremities with ulceration 06/30/2020   Acute on chronic respiratory failure with hypoxia 10/08/2019   Chronic diastolic CHF (congestive heart failure) 10/08/2019   COPD (chronic obstructive pulmonary disease)    Aspiration pneumonia    Sepsis    Closed fracture of sternum    Compression fracture of thoracic vertebra    Nonthrombocytopenic purpura 02/08/2019   Acute on chronic diastolic CHF (congestive heart  failure) 01/27/2019   Acute respiratory failure with hypoxia 01/24/2019   Pneumonia due to COVID-19 virus 01/24/2019   Atrial fibrillation, chronic 01/24/2019   Atrial fibrillation with RVR 01/04/2019   Constipation 01/04/2019   HLD (hyperlipidemia) 01/04/2019   Hypertension    Coronary artery disease    COVID-19     Palliative Care Assessment & Plan   Recommendations/Plan:  Plans in place for home with hospice   Code Status:    Code Status Orders  (From admission, onward)           Start     Ordered   05/10/22 1813  Do not attempt resuscitation (DNR)  Continuous       Question Answer Comment  If patient has no pulse and is not breathing Do Not Attempt Resuscitation   If patient has a pulse and/or is breathing: Medical Treatment Goals LIMITED ADDITIONAL INTERVENTIONS: Use medication/IV fluids and cardiac monitoring as indicated; Do not use intubation or mechanical ventilation (DNI), also provide comfort medications.  Transfer to Progressive/Stepdown as indicated, avoid Intensive Care.   Consent: Discussion documented in EHR or advanced directives reviewed      05/10/22 1815           Code Status History     Date Active Date Inactive Code Status Order ID Comments User Context   05/03/2022 1239 05/03/2022 1847 Full Code [id number]  Linus Galas, [facility] Inpatient   03/28/2022 0202 03/29/2022 2036 DNR [id number]  Andris Baumann, MD Inpatient   03/27/2022 2125 03/28/2022 0202 DNR [id number]  Andris Baumann, MD ED   03/18/2022 1435 03/19/2022 2340 DNR [id number]  Annice Needy, MD Inpatient   03/18/2022 1251 03/18/2022 1435 Full Code [id number]  Annice Needy, MD Inpatient   02/28/2022 1704 03/01/2022 2318 DNR [id number]  Verdene Lennert, MD ED   08/09/2020  1151 08/11/2020 2324 Full Code [id number]  Annice Needy, MD Inpatient   10/08/2019 1757 10/15/2019 1558 DNR [id number]  Lorretta Harp, MD Inpatient   10/08/2019 1530 10/08/2019 1756 Full Code [id number]  Lorretta Harp, MD ED   01/24/2019 2203 01/31/2019 2147  DNR [id number]  Andris Baumann, MD ED   01/24/2019 2145 01/24/2019 2202 Full Code [id number]  Andris Baumann, MD ED   01/04/2019 1926 01/08/2019 1606 Full Code [id number]  Lorretta Harp, MD ED      Advance Directive Documentation    Flowsheet Row Most Recent Value  Type of Advance Directive Out of facility DNR (pink MOST or yellow form)  Pre-existing out of facility DNR order (yellow form or pink MOST form) Yellow form placed in chart (order not valid for inpatient use)  "MOST" Form in Place? --       Prognosis:  < 6 months   Care plan was discussed with attending  Thank you for allowing the Palliative Medicine Team to assist in the care of this patient.    Morton Stall, NP  Please contact Palliative Medicine Team phone at [phone] for questions and concerns.

## 2022-05-16 NOTE — Care Management Important Message (Signed)
Important Message  Patient Details  Name: [name] MRN: [id number] Date of Birth: [birth year]   Medicare Important Message Given:  Yes     Olegario Messier A Geroldine Esquivias 05/16/2022, 11:27 AM

## 2022-05-16 NOTE — Plan of Care (Signed)

## 2022-05-17 ENCOUNTER — Ambulatory Visit (INDEPENDENT_AMBULATORY_CARE_PROVIDER_SITE_OTHER): Payer: Medicare Other | Admitting: Nurse Practitioner

## 2022-05-17 DIAGNOSIS — J189 Pneumonia, unspecified organism: Secondary | ICD-10-CM | POA: Diagnosis not present

## 2022-05-17 DIAGNOSIS — Z7189 Other specified counseling: Secondary | ICD-10-CM | POA: Diagnosis not present

## 2022-05-17 LAB — CBC WITH DIFFERENTIAL/PLATELET
Abs Immature Granulocytes: 0.1 10*3/uL — ABNORMAL HIGH (ref 0.00–0.07)
Basophils Absolute: 0 10*3/uL (ref 0.0–0.1)
Basophils Relative: 0 %
Eosinophils Absolute: 0 10*3/uL (ref 0.0–0.5)
Eosinophils Relative: 0 %
HCT: 29.2 % — ABNORMAL LOW (ref 39.0–52.0)
Hemoglobin: 9 g/dL — ABNORMAL LOW (ref 13.0–17.0)
Immature Granulocytes: 1 %
Lymphocytes Relative: 8 %
Lymphs Abs: 1 10*3/uL (ref 0.7–4.0)
MCH: 29.4 pg (ref 26.0–34.0)
MCHC: 30.8 g/dL (ref 30.0–36.0)
MCV: 95.4 fL (ref 80.0–100.0)
Monocytes Absolute: 0.6 10*3/uL (ref 0.1–1.0)
Monocytes Relative: 5 %
Neutro Abs: 10.2 10*3/uL — ABNORMAL HIGH (ref 1.7–7.7)
Neutrophils Relative %: 86 %
Platelets: 396 10*3/uL (ref 150–400)
RBC: 3.06 MIL/uL — ABNORMAL LOW (ref 4.22–5.81)
RDW: 15.8 % — ABNORMAL HIGH (ref 11.5–15.5)
WBC: 12 10*3/uL — ABNORMAL HIGH (ref 4.0–10.5)
nRBC: 0 % (ref 0.0–0.2)

## 2022-05-17 LAB — BASIC METABOLIC PANEL
Anion gap: 7 (ref 5–15)
BUN: 52 mg/dL — ABNORMAL HIGH (ref 8–23)
CO2: 32 mmol/L (ref 22–32)
Calcium: 8.8 mg/dL — ABNORMAL LOW (ref 8.9–10.3)
Chloride: 112 mmol/L — ABNORMAL HIGH (ref 98–111)
Creatinine, Ser: 1.03 mg/dL (ref 0.61–1.24)
GFR, Estimated: >60 mL/min (ref >60)
Glucose, Bld: 161 mg/dL — ABNORMAL HIGH (ref 70–99)
Potassium: 3.6 mmol/L (ref 3.5–5.1)
Sodium: 151 mmol/L — ABNORMAL HIGH (ref 135–145)

## 2022-05-17 MED ORDER — MORPHINE SULFATE (PF) 2 MG/ML IV SOLN: 2.0000 mg | INTRAVENOUS | Status: DC | PRN

## 2022-05-17 NOTE — Progress Notes (Signed)
Hospital Hospice Liaison continues to follow patient at this time.  Will connect with the daughter and IDT once patient is closer to discharging home.  Please do not hesitate to reach out with any questions or concerns.    Thank you for the opportunity to participate in this patient's care.  [facility]iaison [phone]

## 2022-05-17 NOTE — Progress Notes (Signed)
Daily Progress Note   Patient Name: [name]       Date: 05/17/2022 DOB: 1928/12/10  Age: 89 y.o. MRN#: [id number] Attending Physician: Tresa Moore, MD Primary Care Physician: Gracelyn Nurse, MD Admit Date: 05/10/2022  Reason for Consultation/Follow-up: Establishing goals of care  Subjective: Notes reviewed.  Plans are in place for patient to D/C home with hospice, and [facility] is following for this.  Currently, patient denies any complaint and states he feels good.  He voices that he is ready to go home when able.  Agree with Norco 1 tab q 4 hours PRN  for moderate pain  Agree with Morphine 2mg  for severe pain or dyspnea.   PMT will continue to shadow for needs.    Length of Stay: 6  Current Medications: Scheduled Meds:   albuterol  2.5 mg Nebulization TID   apixaban  2.5 mg Oral BID   aspirin EC  81 mg Oral Q0600   atorvastatin  40 mg Oral QHS   diltiazem  300 mg Oral Daily   feeding supplement  237 mL Oral BID BM   fluticasone furoate-vilanterol  1 puff Inhalation Daily   And   umeclidinium bromide  1 puff Inhalation Daily   guaiFENesin  1,200 mg Oral BID   methylPREDNISolone (SOLU-MEDROL) injection  40 mg Intravenous Q12H   metoprolol tartrate  50 mg Oral BID   metroNIDAZOLE  500 mg Oral Q12H   multivitamin with minerals  1 tablet Oral Daily   nystatin  5 mL Mouth/Throat QID   polyethylene glycol  17 g Oral Daily   senna-docusate  2 tablet Oral BID   Vitamin D (Ergocalciferol)  50,000 Units Oral Q7 days    Continuous Infusions:   PRN Meds: acetaminophen, bisacodyl, HYDROcodone-acetaminophen, hydrOXYzine, levalbuterol, morphine injection, mouth rinse  Physical Exam          Vital Signs: BP 113/72 (BP Location: Left Arm)   Pulse 88   Temp 98.5  F (36.9 C) (Oral)   Resp 18   Ht 5\' 9"  (1.753 m)   Wt 64.4 kg   SpO2 96%   BMI 20.97 kg/m  SpO2: SpO2: 96 % O2 Device: O2 Device: Nasal Cannula O2 Flow Rate: O2 Flow Rate (L/min): 4 L/min  Intake/output summary:  Intake/Output Summary (Last 24 hours) at  05/17/2022 1319 Last data filed at 05/17/2022 0449 Gross per 24 hour  Intake --  Output 1050 ml  Net -1050 ml   LBM: Last BM Date : 05/12/22 Baseline Weight: Weight: 64.4 kg Most recent weight: Weight: 64.4 kg       Palliative Assessment/Data:      Patient Active Problem List   Diagnosis Date Noted   Gangrene of left foot 05/14/2022   AKI (acute kidney injury) 05/14/2022   Community acquired pneumonia 05/10/2022   Elevated troponin 05/10/2022   Chronic kidney disease, stage 3a 03/29/2022   Myocardial injury 03/28/2022   Hypokalemia 03/28/2022   Symptomatic anemia 03/28/2022   Acute blood loss anemia 03/27/2022   S/P right carotid endarterectomy 03/18/22 03/27/2022   H/O left central retinal artery occlusion 03/01/22 03/27/2022   Arm wound, right, subsequent encounter 03/27/2022   Frailty 03/27/2022   Abnormal urinalysis 03/27/2022   Chronic respiratory failure with hypoxia 03/27/2022   Carotid stenosis, right 03/18/2022   Carotid stenosis 03/08/2022   PVD (peripheral vascular disease) 03/08/2022   Acute loss of vision 02/28/2022   Venous ulcer of ankle, right 09/04/2020   Protein-calorie malnutrition, severe 08/10/2020   Atherosclerosis of artery of extremity with ulceration 08/09/2020   Atherosclerosis of native arteries of the extremities with ulceration 06/30/2020   Acute on chronic respiratory failure with hypoxia 10/08/2019   Chronic diastolic CHF (congestive heart failure) 10/08/2019   COPD (chronic obstructive pulmonary disease)    Aspiration pneumonia    Sepsis    Closed fracture of sternum    Compression fracture of thoracic vertebra    Nonthrombocytopenic purpura 02/08/2019   Acute on chronic  diastolic CHF (congestive heart failure) 01/27/2019   Acute respiratory failure with hypoxia 01/24/2019   Pneumonia due to COVID-19 virus 01/24/2019   Atrial fibrillation, chronic 01/24/2019   Atrial fibrillation with RVR 01/04/2019   Constipation 01/04/2019   HLD (hyperlipidemia) 01/04/2019   Hypertension    Coronary artery disease    COVID-19     Palliative Care Assessment & Plan   Recommendations/Plan: Agree with Norco 1 tab q 4 hours PRN  for moderate pain  Agree with Morphine  PRN for severe pain or dyspnea.    PMT will continue to shadow for needs.    Code Status:    Code Status Orders  (From admission, onward)           Start     Ordered   05/10/22 1813  Do not attempt resuscitation (DNR)  Continuous       Question Answer Comment  If patient has no pulse and is not breathing Do Not Attempt Resuscitation   If patient has a pulse and/or is breathing: Medical Treatment Goals LIMITED ADDITIONAL INTERVENTIONS: Use medication/IV fluids and cardiac monitoring as indicated; Do not use intubation or mechanical ventilation (DNI), also provide comfort medications.  Transfer to Progressive/Stepdown as indicated, avoid Intensive Care.   Consent: Discussion documented in EHR or advanced directives reviewed      05/10/22 1815           Code Status History     Date Active Date Inactive Code Status Order ID Comments User Context   05/03/2022 1239 05/03/2022 1847 Full Code [id number]  Linus Galas, [facility] Inpatient   03/28/2022 0202 03/29/2022 2036 DNR [id number]  Andris Baumann, MD Inpatient   03/27/2022 2125 03/28/2022 0202 DNR [id number]  Andris Baumann, MD ED   03/18/2022 1435 03/19/2022 2340 DNR [id number]  Annice Needy, MD Inpatient  03/18/2022 1251 03/18/2022 1435 Full Code [id number]  Annice Needy, MD Inpatient   02/28/2022 1704 03/01/2022 2318 DNR [id number]  Verdene Lennert, MD ED   08/09/2020 1151 08/11/2020 2324 Full Code [id number]  Annice Needy, MD Inpatient   10/08/2019 1757  10/15/2019 1558 DNR [id number]  Lorretta Harp, MD Inpatient   10/08/2019 1530 10/08/2019 1756 Full Code [id number]  Lorretta Harp, MD ED   01/24/2019 2203 01/31/2019 2147 DNR [id number]  Andris Baumann, MD ED   01/24/2019 2145 01/24/2019 2202 Full Code [id number]  Andris Baumann, MD ED   01/04/2019 1926 01/08/2019 1606 Full Code [id number]  Lorretta Harp, MD ED      Advance Directive Documentation    Flowsheet Row Most Recent Value  Type of Advance Directive Out of facility DNR (pink MOST or yellow form)  Pre-existing out of facility DNR order (yellow form or pink MOST form) Yellow form placed in chart (order not valid for inpatient use)  "MOST" Form in Place? --       Prognosis: Poor overall   Thank you for allowing the Palliative Medicine Team to assist in the care of this patient.    Morton Stall, NP  Please contact Palliative Medicine Team phone at [phone] for questions and concerns.

## 2022-05-17 NOTE — Progress Notes (Signed)
\ PROGRESS NOTE    [name]  WRU:[id number] DOB: [birth year] DOA: 05/10/2022 PCP: Gracelyn Nurse, MD    Brief Narrative:  89 y.o. male multiple medical problems including but not limited to recent left great toe amputation for osteomyelitis left great toe, chronic diastolic CHF, PVD, CAD, right carotid artery stenosis, aortic stenosis, right bundle branch block, history of COVID-19 infection, history of aspiration pneumonia, COPD, chronic hypoxic respiratory failure on 4 L oxygen, atrial fibrillation, anemia of chronic disease history was obtained from the patient and [name] (his daughter) at the bedside.  EMR chart was also reviewed.  He presented to the hospital because of increasing shortness of breath, easy fatigability, poor oral intake and constipation.  He has not been feeling well since he had left great toe amputation on 05/03/2022.  Because of easy fatigability and exertional shortness of breath he has mostly been sedentary.  He has not moved his bowels for about 8 days.  He has had a lot of nausea/dry heaving and vomiting mucus.    Assessment & Plan:   Principal Problem:   Community acquired pneumonia Active Problems:   Hypokalemia   Chronic diastolic CHF (congestive heart failure)   COPD (chronic obstructive pulmonary disease)   Chronic respiratory failure with hypoxia   Atrial fibrillation with RVR   Coronary artery disease   Constipation   Acute on chronic diastolic CHF (congestive heart failure)   PVD (peripheral vascular disease)   Elevated troponin   Gangrene of left foot   AKI (acute kidney injury)  Left lower lobe pneumonia: No growth on blood cultures thus far.  Continue Levaquin and Flagyl.  7-day stop date in place.  Strep pneumo and Legionella urine antigens were negative.   Continue Solu-Medrol 40 mg every 12 hours IV     Acute on chronic hypoxic respiratory failure: Improved.  Back to baseline 4 L.  Steroids as above.  As needed bronchodilators.       Hypokalemia: Improved     Worsening AKI: Creatinine went up to 1.56.  Now improving.  Holding torsemide.  No IV fluids at this time     Elevated troponins: No chest pain.  This is likely due to demand ischemia from infection.       Constipation: Improved.  He has moved his bowels. No acute abnormality or bowel obstruction noted on CT abdomen.   Continue bowel regimen scheduled and as needed     Acute on chronic diastolic CHF: Chest ray done on 05/12/2022 showed increased bilateral pulmonary edema with right-sided pleural effusions.  Torsemide has been held.  No IV fluids.     COPD with chronic hypoxic respiratory failure: On IV Solu-Medrol..  Hold home prednisone.  Continue bronchodilator regimen.     Atrial fibrillation with RVR: Continue metoprolol, Cardizem and Eliquis     PVD, recent left great toe osteomyelitis s/p left great toe amputation on 05/03/2022: Wound culture showed Pseudomonas aeruginosa infection.  He was on ciprofloxacin and Augmentin at home (to be completed on 05/16/2022). Consulted Dr. Alberteen Spindle, podiatrist.  He said the recent left hallux amputation is not healing and foot is starting to appear gangrenous.  He had discussed this with Dr. Wyn Quaker, vascular surgeon, who said the only option would be to do a high level amputation.  Unfortunately, patient is at very high risk for complications for surgery.  Poor surgical candidate at this time.  Prognosis is poor without intervention.  Unfortunately options very limited at this time.  Recommend de-escalation  of care, symptom management, hospice referral.  Palliative care to follow-up. Current plan of care is to discharge home with hospice services after daughter has time to arrange all DME delivery.     Recent oral thrush prior to admission: Nystatin suspension has been reordered.   DVT prophylaxis: Eliquis Code Status: DNR Family Communication: Daughter [name] [phone] via phone 4/17, 4/18 Disposition Plan: Status is:  Inpatient Remains inpatient appropriate because: Gangrene of foot.  Community-acquired pneumonia   Level of care: Med-Surg  Consultants:  Palliative care  Procedures:  None  Antimicrobials: Levaquin Flagyl   Subjective: Seen and examined.  Lying in bed.  Mentating clearly.  Appears winded.  Appears chronically and acutely ill  Objective: Vitals:   05/16/22 0008 05/16/22 0416 05/16/22 0735 05/16/22 0804  BP: 133/78 120/81 115/70   Pulse: 88 97    Resp: 20 20 (!) 24   Temp: 97.8 F (36.6 C) 97.7 F (36.5 C) (!) 97.5 F (36.4 C)   TempSrc:      SpO2: 94% 96% 95% 94%  Weight:      Height:        Intake/Output Summary (Last 24 hours) at 05/16/2022 1259 Last data filed at 05/16/2022 1053 Gross per 24 hour  Intake 1120 ml  Output 850 ml  Net 270 ml   Filed Weights   05/10/22 1251  Weight: 64.4 kg    Examination:  General exam: Appears fatigued.  Ill-appearing Respiratory system: No dyspnea noted.  Coarse breath sounds.  4 L Cardiovascular system: S1-S2, RRR, no murmurs, no pedal edema Gastrointestinal system: Soft, NT/ND, normal bowel sounds Central nervous system: Alert and oriented. No focal neurological deficits. Extremities: Symmetrically decreased bilaterally Skin: Right foot status post hallux amputation.  Significant discoloration of foot. Psychiatry: Judgement and insight appear normal. Mood & affect appropriate.     Data Reviewed: I have personally reviewed following labs and imaging studies  CBC: Recent Labs  Lab 05/10/22 1529 05/13/22 0335 05/14/22 0502 05/15/22 0614  WBC 10.5 14.7* 15.2* 13.3*  NEUTROABS 8.1* 11.5* 12.4* 10.5*  HGB 8.8* 8.6* 9.2* 8.9*  HCT 27.7* 27.7* 29.7* 29.0*  MCV 93.9 94.9 94.9 95.4  PLT 295 309 372 362   Basic Metabolic Panel: Recent Labs  Lab 05/10/22 1529 05/11/22 0547 05/13/22 0335 05/14/22 0502 05/15/22 0614  NA 139 141 147* 150* 147*  K 2.7* 3.3* 3.4* 4.0 3.7  CL 96* 101 106 108 110  CO2 30 29 32  33* 30  GLUCOSE 99 85 108* 110* 88  BUN 22 23 39* 43* 45*  CREATININE 1.02 1.04 1.31* 1.56* 1.35*  CALCIUM 8.5* 8.3* 8.3* 8.4* 8.5*  MG 1.7 2.2 2.0 2.2 2.3   GFR: Estimated Creatinine Clearance: 32.5 mL/min (A) (by C-G formula based on SCr of 1.35 mg/dL (H)). Liver Function Tests: Recent Labs  Lab 05/10/22 1529  AST 38  ALT 15  ALKPHOS 66  BILITOT 0.6  PROT 6.8  ALBUMIN 2.9*   No results for input(s): "LIPASE", "AMYLASE" in the last 168 hours. No results for input(s): "AMMONIA" in the last 168 hours. Coagulation Profile: No results for input(s): "INR", "PROTIME" in the last 168 hours. Cardiac Enzymes: No results for input(s): "CKTOTAL", "CKMB", "CKMBINDEX", "TROPONINI" in the last 168 hours. BNP (last 3 results) No results for input(s): "PROBNP" in the last 8760 hours. HbA1C: No results for input(s): "HGBA1C" in the last 72 hours. CBG: Recent Labs  Lab 05/12/22 0144  GLUCAP 142*   Lipid Profile: No results for input(s): "  CHOL", "HDL", "LDLCALC", "TRIG", "CHOLHDL", "LDLDIRECT" in the last 72 hours. Thyroid Function Tests: No results for input(s): "TSH", "T4TOTAL", "FREET4", "T3FREE", "THYROIDAB" in the last 72 hours. Anemia Panel: No results for input(s): "VITAMINB12", "FOLATE", "FERRITIN", "TIBC", "IRON", "RETICCTPCT" in the last 72 hours. Sepsis Labs: Recent Labs  Lab 05/10/22 1529  LATICACIDVEN 1.3    Recent Results (from the past 240 hour(s))  Culture, blood (Routine X 2) w Reflex to ID Panel     Status: None   Collection Time: 05/10/22  8:45 PM   Specimen: BLOOD  Result Value Ref Range Status   Specimen Description BLOOD BLOOD LEFT HAND  Final   Special Requests   Final    BOTTLES DRAWN AEROBIC AND ANAEROBIC Blood Culture results may not be optimal due to an inadequate volume of blood received in culture bottles   Culture   Final    NO GROWTH 5 DAYS Performed at [facility], [address], [place], [place] [address]    Report Status  05/15/2022 FINAL  Final  Culture, blood (Routine X 2) w Reflex to ID Panel     Status: None   Collection Time: 05/10/22  8:45 PM   Specimen: BLOOD  Result Value Ref Range Status   Specimen Description BLOOD RIGHT ANTECUBITAL  Final   Special Requests   Final    BOTTLES DRAWN AEROBIC AND ANAEROBIC Blood Culture adequate volume   Culture   Final    NO GROWTH 5 DAYS Performed at [facility], [address], [place], [place] [address]    Report Status 05/15/2022 FINAL  Final         Radiology Studies: No results found.      Scheduled Meds:  albuterol  2.5 mg Nebulization TID   apixaban  2.5 mg Oral BID   aspirin EC  81 mg Oral Q0600   atorvastatin  40 mg Oral QHS   diltiazem  300 mg Oral Daily   feeding supplement  237 mL Oral BID BM   fluticasone furoate-vilanterol  1 puff Inhalation Daily   And   umeclidinium bromide  1 puff Inhalation Daily   guaiFENesin  1,200 mg Oral BID   levofloxacin  750 mg Oral Q48H   methylPREDNISolone (SOLU-MEDROL) injection  40 mg Intravenous Q12H   metoprolol tartrate  50 mg Oral BID   metroNIDAZOLE  500 mg Oral Q12H   multivitamin with minerals  1 tablet Oral Daily   nystatin  5 mL Mouth/Throat QID   polyethylene glycol  17 g Oral Daily   senna-docusate  2 tablet Oral BID   Vitamin D (Ergocalciferol)  50,000 Units Oral Q7 days   Continuous Infusions:   LOS: 5 days     Christopher Moore, MD [facility]   If 7PM-7AM, please contact night-coverage  05/16/2022, 12:59 PM

## 2022-05-18 DIAGNOSIS — J189 Pneumonia, unspecified organism: Secondary | ICD-10-CM | POA: Diagnosis not present

## 2022-05-18 MED ORDER — ALBUTEROL SULFATE (2.5 MG/3ML) 0.083% IN NEBU: 2.5000 mg | INHALATION_SOLUTION | RESPIRATORY_TRACT | Status: DC | PRN

## 2022-05-18 MED ORDER — IPRATROPIUM-ALBUTEROL 0.5-2.5 (3) MG/3ML IN SOLN
3.0000 mL | Freq: Three times a day (TID) | RESPIRATORY_TRACT | Status: DC
  Administered 2022-05-18 – 2022-05-21 (×9): 3 mL via RESPIRATORY_TRACT
  Filled 2022-05-18 (×9): qty 3

## 2022-05-18 MED ORDER — TORSEMIDE 20 MG PO TABS
20.0000 mg | ORAL_TABLET | Freq: Every day | ORAL | Status: DC
  Administered 2022-05-18 – 2022-05-21 (×4): 20 mg via ORAL
  Filled 2022-05-18 (×4): qty 1

## 2022-05-18 NOTE — Progress Notes (Signed)
\ PROGRESS NOTE    [name]  WUJ:[id number] DOB: 1928/08/12 DOA: 05/10/2022 PCP: Gracelyn Nurse, MD    Brief Narrative:  89 y.o. male multiple medical problems including but not limited to recent left great toe amputation for osteomyelitis left great toe, chronic diastolic CHF, PVD, CAD, right carotid artery stenosis, aortic stenosis, right bundle branch block, history of COVID-19 infection, history of aspiration pneumonia, COPD, chronic hypoxic respiratory failure on 4 L oxygen, atrial fibrillation, anemia of chronic disease history was obtained from the patient and [name] (his daughter) at the bedside.  EMR chart was also reviewed.  He presented to the hospital because of increasing shortness of breath, easy fatigability, poor oral intake and constipation.  He has not been feeling well since he had left great toe amputation on 05/03/2022.  Because of easy fatigability and exertional shortness of breath he has mostly been sedentary.  He has not moved his bowels for about 8 days.  He has had a lot of nausea/dry heaving and vomiting mucus.    Assessment & Plan:   Principal Problem:   Community acquired pneumonia Active Problems:   Hypokalemia   Chronic diastolic CHF (congestive heart failure)   COPD (chronic obstructive pulmonary disease)   Chronic respiratory failure with hypoxia   Atrial fibrillation with RVR   Coronary artery disease   Constipation   Acute on chronic diastolic CHF (congestive heart failure)   PVD (peripheral vascular disease)   Elevated troponin   Gangrene of left foot   AKI (acute kidney injury)  Left lower lobe pneumonia: No growth on blood cultures thus far.  Continue Levaquin and Flagyl.  7-day stop date in place.  Strep pneumo and Legionella urine antigens were negative.   Continue Solu-Medrol 40 mg every 12 hours IV until discharge.     Acute on chronic hypoxic respiratory failure: Improved.  Back to baseline 4 L.  Steroids as above.  As needed  bronchodilators.      Hypokalemia: Improved     Worsening AKI: Creatinine went up to 1.56.  Now improving.  Holding torsemide.  No IV fluids at this time     Elevated troponins: No chest pain.  This is likely due to demand ischemia from infection.       Constipation: Improved.  He has moved his bowels. No acute abnormality or bowel obstruction noted on CT abdomen.   Continue bowel regimen scheduled and as needed     Acute on chronic diastolic CHF: Chest ray done on 05/12/2022 showed increased bilateral pulmonary edema with right-sided pleural effusions.  Torsemide has been held.  No IV fluids.     COPD with chronic hypoxic respiratory failure: On IV Solu-Medrol..  Hold home prednisone.  Continue bronchodilator regimen.     Atrial fibrillation with RVR: Continue metoprolol, Cardizem and Eliquis     PVD, recent left great toe osteomyelitis s/p left great toe amputation on 05/03/2022: Wound culture showed Pseudomonas aeruginosa infection.  He was on ciprofloxacin and Augmentin at home (to be completed on 05/16/2022). Consulted Dr. Alberteen Spindle, podiatrist.  He said the recent left hallux amputation is not healing and foot is starting to appear gangrenous.  He had discussed this with Dr. Wyn Quaker, vascular surgeon, who said the only option would be to do a high level amputation.  Unfortunately, patient is at very high risk for complications for surgery.  Poor surgical candidate at this time.  Prognosis is poor without intervention.  Unfortunately options very limited at this time.  Recommend de-escalation of care, symptom management, hospice referral.  Palliative care to follow-up. Current plan of care is to discharge home with hospice services after daughter has time to arrange all DME delivery.     Recent oral thrush prior to admission: Nystatin suspension has been reordered.   DVT prophylaxis: Eliquis Code Status: DNR Family Communication: Daughter [name] [phone] via phone 4/17, 4/18,  4/20 Disposition Plan: Status is: Inpatient Remains inpatient appropriate because: Gangrene of foot.  Community-acquired pneumonia   Level of care: Med-Surg  Consultants:  Palliative care  Procedures:  None  Antimicrobials: Levaquin Flagyl   Subjective: Seen and examined.  Lying in bed.  Mentating clearly.  Appears winded.  Appears chronically and acutely ill  Objective: Vitals:   05/17/22 2026 05/18/22 0425 05/18/22 0717 05/18/22 0744  BP: 130/87 (!) 148/90  138/73  Pulse: 73 99 84 93  Resp: Temp: 98 F (36.7 C) 98 F (36.7 C)  98.3 F (36.8 C)  TempSrc: Oral Oral  Oral  SpO2: 93% 100% 97% 100%  Weight:      Height:        Intake/Output Summary (Last 24 hours) at 05/18/2022 1047 Last data filed at 05/18/2022 0425 Gross per 24 hour  Intake --  Output 1000 ml  Net -1000 ml   Filed Weights   05/10/22 1251  Weight: 64.4 kg    Examination:  General exam: Appears fatigued.  Ill-appearing Respiratory system: Appears dyspneic.  Coarse breath sounds.  4 L Cardiovascular system: S1-S2, RRR, no murmurs, no pedal edema Gastrointestinal system: Soft, NT/ND, normal bowel sounds Central nervous system: Alert and oriented. No focal neurological deficits. Extremities: Symmetrically decreased bilaterally Skin: Right foot status post hallux amputation.  Significant discoloration of foot. Psychiatry: Judgement and insight appear normal. Mood & affect appropriate.     Data Reviewed: I have personally reviewed following labs and imaging studies  CBC: Recent Labs  Lab 05/13/22 0335 05/14/22 0502 05/15/22 0614 05/17/22 0421  WBC 14.7* 15.2* 13.3* 12.0*  NEUTROABS 11.5* 12.4* 10.5* 10.2*  HGB 8.6* 9.2* 8.9* 9.0*  HCT 27.7* 29.7* 29.0* 29.2*  MCV 94.9 94.9 95.4 95.4  PLT 309 372 362 396   Basic Metabolic Panel: Recent Labs  Lab 05/13/22 0335 05/14/22 0502 05/15/22 0614 05/17/22 0421  NA 147* 150* 147* 151*  K 3.4* 4.0 3.7 3.6  CL 106 108 110  112*  CO2 32 33* 30 32  GLUCOSE 108* 110* 88 161*  BUN 39* 43* 45* 52*  CREATININE 1.31* 1.56* 1.35* 1.03  CALCIUM 8.3* 8.4* 8.5* 8.8*  MG 2.0 2.2 2.3  --    GFR: Estimated Creatinine Clearance: 42.6 mL/min (by C-G formula based on SCr of 1.03 mg/dL). Liver Function Tests: No results for input(s): "AST", "ALT", "ALKPHOS", "BILITOT", "PROT", "ALBUMIN" in the last 168 hours.  No results for input(s): "LIPASE", "AMYLASE" in the last 168 hours. No results for input(s): "AMMONIA" in the last 168 hours. Coagulation Profile: No results for input(s): "INR", "PROTIME" in the last 168 hours. Cardiac Enzymes: No results for input(s): "CKTOTAL", "CKMB", "CKMBINDEX", "TROPONINI" in the last 168 hours. BNP (last 3 results) No results for input(s): "PROBNP" in the last 8760 hours. HbA1C: No results for input(s): "HGBA1C" in the last 72 hours. CBG: Recent Labs  Lab 05/12/22 0144  GLUCAP 142*   Lipid Profile: No results for input(s): "CHOL", "HDL", "LDLCALC", "TRIG", "CHOLHDL", "LDLDIRECT" in the last 72 hours. Thyroid Function Tests: No results for input(s): "TSH", "T4TOTAL", "FREET4", "  T3FREE", "THYROIDAB" in the last 72 hours. Anemia Panel: No results for input(s): "VITAMINB12", "FOLATE", "FERRITIN", "TIBC", "IRON", "RETICCTPCT" in the last 72 hours. Sepsis Labs: No results for input(s): "PROCALCITON", "LATICACIDVEN" in the last 168 hours.   Recent Results (from the past 240 hour(s))  Culture, blood (Routine X 2) w Reflex to ID Panel     Status: None   Collection Time: 05/10/22  8:45 PM   Specimen: BLOOD  Result Value Ref Range Status   Specimen Description BLOOD BLOOD LEFT HAND  Final   Special Requests   Final    BOTTLES DRAWN AEROBIC AND ANAEROBIC Blood Culture results may not be optimal due to an inadequate volume of blood received in culture bottles   Culture   Final    NO GROWTH 5 DAYS Performed at [facility], [address] [place], [place] [address]    Report  Status 05/15/2022 FINAL  Final  Culture, blood (Routine X 2) w Reflex to ID Panel     Status: None   Collection Time: 05/10/22  8:45 PM   Specimen: BLOOD  Result Value Ref Range Status   Specimen Description BLOOD RIGHT ANTECUBITAL  Final   Special Requests   Final    BOTTLES DRAWN AEROBIC AND ANAEROBIC Blood Culture adequate volume   Culture   Final    NO GROWTH 5 DAYS Performed at [facility], [address], [place], [place] [address]    Report Status 05/15/2022 FINAL  Final         Radiology Studies: No results found.      Scheduled Meds:  apixaban  2.5 mg Oral BID   aspirin EC  81 mg Oral Q0600   atorvastatin  40 mg Oral QHS   diltiazem  300 mg Oral Daily   feeding supplement  237 mL Oral BID BM   fluticasone furoate-vilanterol  1 puff Inhalation Daily   And   umeclidinium bromide  1 puff Inhalation Daily   guaiFENesin  1,200 mg Oral BID   ipratropium-albuterol  3 mL Nebulization TID   methylPREDNISolone (SOLU-MEDROL) injection  40 mg Intravenous Q12H   metoprolol tartrate  50 mg Oral BID   multivitamin with minerals  1 tablet Oral Daily   nystatin  5 mL Mouth/Throat QID   polyethylene glycol  17 g Oral Daily   senna-docusate  2 tablet Oral BID   Vitamin D (Ergocalciferol)  50,000 Units Oral Q7 days   Continuous Infusions:   LOS: 7 days     Christopher Moore, MD [facility]   If 7PM-7AM, please contact night-coverage  05/18/2022, 10:47 AM

## 2022-05-18 NOTE — Progress Notes (Signed)
Mobility Specialist - Progress Note   05/18/22 1335  Mobility  Activity Ambulated with assistance in room  Level of Assistance Minimal assist, patient does 75% or more  Assistive Device Front wheel walker  Distance Ambulated (ft) 3 ft  Activity Response Tolerated well  Mobility Referral Yes  $Mobility charge 1 Mobility   Pt sitting in recliner on 4L upon arrival. Pt STS with MinA +2 and ambulates from recliner to bed CGA. Pt left in bed with needs in reach and NT and daughter present.   Terrilyn Saver  Mobility Specialist  05/18/22 1:38 PM

## 2022-05-18 NOTE — Progress Notes (Signed)
Mobility Specialist - Progress Note   05/18/22 1030  Mobility  Activity Ambulated with assistance in room;Stood at bedside;Transferred from bed to chair;Dangled on edge of bed  Level of Assistance Contact guard assist, steadying assist  Assistive Device Front wheel walker  Distance Ambulated (ft) 3 ft  Activity Response Tolerated well  Mobility Referral Yes  $Mobility charge 1 Mobility   Pt semi-supine in bed on 4L upon arrival. Pt completes bed mobility with HHA and extra time. Pt STS and takes a few steps from bed to recliner CGA with no LOB noted. Pt desats to SPO2 of 81 after transfer. RN notified, Pt SPO2 91 before leaving room. Pt left in recliner with needs in reach and NT in room.   [name]  Mobility Specialist  05/18/22 10:32 AM

## 2022-05-19 DIAGNOSIS — J189 Pneumonia, unspecified organism: Secondary | ICD-10-CM | POA: Diagnosis not present

## 2022-05-19 NOTE — Progress Notes (Signed)
\ PROGRESS NOTE    [name]  ZOX:[id number] DOB: 04/26/1928 DOA: 05/10/2022 PCP: Gracelyn Nurse, MD    Brief Narrative:  89 y.o. male multiple medical problems including but not limited to recent left great toe amputation for osteomyelitis left great toe, chronic diastolic CHF, PVD, CAD, right carotid artery stenosis, aortic stenosis, right bundle branch block, history of COVID-19 infection, history of aspiration pneumonia, COPD, chronic hypoxic respiratory failure on 4 L oxygen, atrial fibrillation, anemia of chronic disease history was obtained from the patient and [name] (his daughter) at the bedside.  EMR chart was also reviewed.  He presented to the hospital because of increasing shortness of breath, easy fatigability, poor oral intake and constipation.  He has not been feeling well since he had left great toe amputation on 05/03/2022.  Because of easy fatigability and exertional shortness of breath he has mostly been sedentary.  He has not moved his bowels for about 8 days.  He has had a lot of nausea/dry heaving and vomiting mucus.    Assessment & Plan:   Principal Problem:   Community acquired pneumonia Active Problems:   Hypokalemia   Chronic diastolic CHF (congestive heart failure)   COPD (chronic obstructive pulmonary disease)   Chronic respiratory failure with hypoxia   Atrial fibrillation with RVR   Coronary artery disease   Constipation   Acute on chronic diastolic CHF (congestive heart failure)   PVD (peripheral vascular disease)   Elevated troponin   Gangrene of left foot   AKI (acute kidney injury)  Left lower lobe pneumonia: No growth on blood cultures thus far.  Continue Levaquin and Flagyl.  7-day stop date in place.  Strep pneumo and Legionella urine antigens were negative.   Continue Solu-Medrol 40 mg every 12 hours IV until discharge.     Acute on chronic hypoxic respiratory failure: Improved.  Back to baseline 4 L.  Steroids as above.  As needed  bronchodilators.      Hypokalemia: Improved     Worsening AKI: Creatinine went up to 1.56.  Now improving.  Restarted home torsemide 4/20     Elevated troponins: No chest pain.  This is likely due to demand ischemia from infection.       Constipation: Improved.  He has moved his bowels. No acute abnormality or bowel obstruction noted on CT abdomen.   Continue bowel regimen scheduled and as needed     Acute on chronic diastolic CHF: Chest ray done on 05/12/2022 showed increased bilateral pulmonary edema with right-sided pleural effusions.  Torsemide restarted as above     COPD with chronic hypoxic respiratory failure: On IV Solu-Medrol..  Hold home prednisone.  Continue bronchodilator regimen.     Atrial fibrillation with RVR: Continue metoprolol, Cardizem and Eliquis     PVD, recent left great toe osteomyelitis s/p left great toe amputation on 05/03/2022: Wound culture showed Pseudomonas aeruginosa infection.  He was on ciprofloxacin and Augmentin at home (to be completed on 05/16/2022). Consulted Dr. Alberteen Spindle, podiatrist.  He said the recent left hallux amputation is not healing and foot is starting to appear gangrenous.  He had discussed this with Dr. Wyn Quaker, vascular surgeon, who said the only option would be to do a high level amputation.  Unfortunately, patient is at very high risk for complications for surgery.  Poor surgical candidate at this time.  Prognosis is poor without intervention.  Unfortunately options very limited at this time.  Recommend de-escalation of care, symptom management, hospice referral.  Palliative care to follow-up. Current plan of care is to discharge home with hospice services after daughter has time to arrange all DME delivery..  Hope to discharge by Tuesday 4/23     Recent oral thrush prior to admission: Nystatin suspension has been reordered.   DVT prophylaxis: Eliquis Code Status: DNR Family Communication: Daughter [name] [phone] via phone 4/17, 4/18,  4/20 Disposition Plan: Status is: Inpatient Remains inpatient appropriate because: Gangrene of foot.  Community-acquired pneumonia   Level of care: Med-Surg  Consultants:  Palliative care  Procedures:  None  Antimicrobials: Levaquin Flagyl   Subjective: Seen and examined.  Sitting up in bed.  Mentating clearly.  Appears short of breath Objective: Vitals:   05/18/22 2127 05/19/22 0457 05/19/22 0736 05/19/22 0900  BP:  (!) 140/84  (!) 118/55  Pulse:  92 93 88  Resp:  Temp:  97.9 F (36.6 C)  (!) 97.3 F (36.3 C)  TempSrc:    Oral  SpO2: 95% 90% 97%   Weight:      Height:        Intake/Output Summary (Last 24 hours) at 05/19/2022 1021 Last data filed at 05/19/2022 0100 Gross per 24 hour  Intake --  Output 1200 ml  Net -1200 ml   Filed Weights   05/10/22 1251  Weight: 64.4 kg    Examination:  General exam: Appears fatigued.  Appears dyspneic Respiratory system: Appears dyspneic.  Coarse breath sounds.  4 L Cardiovascular system: S1-S2, RRR, no murmurs, no pedal edema Gastrointestinal system: Thin, soft, NT/ND, normal bowel sounds Central nervous system: Alert and oriented. No focal neurological deficits. Extremities: Symmetrically decreased bilaterally Skin: Right foot status post hallux amputation.  Significant discoloration of foot. Psychiatry: Judgement and insight appear normal. Mood & affect appropriate.     Data Reviewed: I have personally reviewed following labs and imaging studies  CBC: Recent Labs  Lab 05/13/22 0335 05/14/22 0502 05/15/22 0614 05/17/22 0421  WBC 14.7* 15.2* 13.3* 12.0*  NEUTROABS 11.5* 12.4* 10.5* 10.2*  HGB 8.6* 9.2* 8.9* 9.0*  HCT 27.7* 29.7* 29.0* 29.2*  MCV 94.9 94.9 95.4 95.4  PLT 309 372 362 396   Basic Metabolic Panel: Recent Labs  Lab 05/13/22 0335 05/14/22 0502 05/15/22 0614 05/17/22 0421  NA 147* 150* 147* 151*  K 3.4* 4.0 3.7 3.6  CL 106 108 110 112*  CO2 32 33* 30 32  GLUCOSE 108* 110* 88  161*  BUN 39* 43* 45* 52*  CREATININE 1.31* 1.56* 1.35* 1.03  CALCIUM 8.3* 8.4* 8.5* 8.8*  MG 2.0 2.2 2.3  --    GFR: Estimated Creatinine Clearance: 42.6 mL/min (by C-G formula based on SCr of 1.03 mg/dL). Liver Function Tests: No results for input(s): "AST", "ALT", "ALKPHOS", "BILITOT", "PROT", "ALBUMIN" in the last 168 hours.  No results for input(s): "LIPASE", "AMYLASE" in the last 168 hours. No results for input(s): "AMMONIA" in the last 168 hours. Coagulation Profile: No results for input(s): "INR", "PROTIME" in the last 168 hours. Cardiac Enzymes: No results for input(s): "CKTOTAL", "CKMB", "CKMBINDEX", "TROPONINI" in the last 168 hours. BNP (last 3 results) No results for input(s): "PROBNP" in the last 8760 hours. HbA1C: No results for input(s): "HGBA1C" in the last 72 hours. CBG: No results for input(s): "GLUCAP" in the last 168 hours.  Lipid Profile: No results for input(s): "CHOL", "HDL", "LDLCALC", "TRIG", "CHOLHDL", "LDLDIRECT" in the last 72 hours. Thyroid Function Tests: No results for input(s): "TSH", "T4TOTAL", "FREET4", "T3FREE", "THYROIDAB" in the last  72 hours. Anemia Panel: No results for input(s): "VITAMINB12", "FOLATE", "FERRITIN", "TIBC", "IRON", "RETICCTPCT" in the last 72 hours. Sepsis Labs: No results for input(s): "PROCALCITON", "LATICACIDVEN" in the last 168 hours.   Recent Results (from the past 240 hour(s))  Culture, blood (Routine X 2) w Reflex to ID Panel     Status: None   Collection Time: 05/10/22  8:45 PM   Specimen: BLOOD  Result Value Ref Range Status   Specimen Description BLOOD BLOOD LEFT HAND  Final   Special Requests   Final    BOTTLES DRAWN AEROBIC AND ANAEROBIC Blood Culture results may not be optimal due to an inadequate volume of blood received in culture bottles   Culture   Final    NO GROWTH 5 DAYS Performed at [facility], [address] [place], [place] [address]    Report Status 05/15/2022 FINAL  Final   Culture, blood (Routine X 2) w Reflex to ID Panel     Status: None   Collection Time: 05/10/22  8:45 PM   Specimen: BLOOD  Result Value Ref Range Status   Specimen Description BLOOD RIGHT ANTECUBITAL  Final   Special Requests   Final    BOTTLES DRAWN AEROBIC AND ANAEROBIC Blood Culture adequate volume   Culture   Final    NO GROWTH 5 DAYS Performed at [facility], [address] [place], [place] [address]    Report Status 05/15/2022 FINAL  Final         Radiology Studies: No results found.      Scheduled Meds:  apixaban  2.5 mg Oral BID   aspirin EC  81 mg Oral Q0600   atorvastatin  40 mg Oral QHS   diltiazem  300 mg Oral Daily   feeding supplement  237 mL Oral BID BM   fluticasone furoate-vilanterol  1 puff Inhalation Daily   And   umeclidinium bromide  1 puff Inhalation Daily   guaiFENesin  1,200 mg Oral BID   ipratropium-albuterol  3 mL Nebulization TID   methylPREDNISolone (SOLU-MEDROL) injection  40 mg Intravenous Q12H   metoprolol tartrate  50 mg Oral BID   multivitamin with minerals  1 tablet Oral Daily   nystatin  5 mL Mouth/Throat QID   polyethylene glycol  17 g Oral Daily   senna-docusate  2 tablet Oral BID   torsemide  20 mg Oral Daily   Vitamin D (Ergocalciferol)  50,000 Units Oral Q7 days   Continuous Infusions:   LOS: 8 days     Tresa Moore, MD [facility]   If 7PM-7AM, please contact night-coverage  05/19/2022, 10:21 AM

## 2022-05-19 NOTE — Progress Notes (Signed)
Civil engineer, contracting Nurse Liaison Note  Hospital Liaison Team  continues to follow patient at this time.   Will connect with the daughter and IDT once patient is closer to discharging home.   Please do not hesitate to reach out with any hospice related questions or concerns.     Thank you for the opportunity to participate in this patient's care.  Norris Cross, RN Nurse Liaison [phone]

## 2022-05-20 DIAGNOSIS — J189 Pneumonia, unspecified organism: Secondary | ICD-10-CM | POA: Diagnosis not present

## 2022-05-20 MED ORDER — AMOXICILLIN-POT CLAVULANATE 875-125 MG PO TABS
1.0000 | ORAL_TABLET | Freq: Two times a day (BID) | ORAL | Status: DC
  Administered 2022-05-20 – 2022-05-21 (×3): 1 via ORAL
  Filled 2022-05-20 (×3): qty 1

## 2022-05-20 NOTE — Care Management Important Message (Signed)
Important Message  Patient Details  Name: MYCHEAL VELDHUIZEN MRN: [id number] Date of Birth: [birth year]   Medicare Important Message Given:  Other (see comment)  Patient is going to discharge home with hospice. Out of respect for the patient and family no Important Message from [facility] given.   Olegario Messier A Shraddha Lebron 05/20/2022, 2:10 PM

## 2022-05-20 NOTE — Progress Notes (Signed)
Pt dyspneic with at rest and with exertion, unable to remove purewick at this time.

## 2022-05-20 NOTE — Progress Notes (Signed)
OT Cancellation Note  Patient Details Name: [name] MRN: [id number] DOB: Sep 08, 1928   Cancelled Treatment:    Reason Eval/Treat Not Completed: Other (comment). Pt's chart reviewed. Per EMR pt to discharge home with hospice care. Appears pt will have needed DME at time of discharge. Please re-consult if OT needs are identified. OT to sign off.  Dorene Grebe  [facility] 05/20/2022, 11:18 AM

## 2022-05-20 NOTE — Progress Notes (Signed)
Mobility Specialist - Progress Note   05/20/22 1005  Mobility  Activity  (bed level exercise)  Level of Assistance Independent  Assistive Device None  Range of Motion/Exercises Active;Right leg;Left leg  Activity Response Tolerated well  $Mobility charge 1 Mobility   Pt supine upon entry, utilizing [place]. Pt denied OOB amb this date/time, however acceptable to bed level exercises. Pt completed B LE leg raises, ankle pumps and circles-- tolerated well. Pt left supine with alarm set and needs within reach.   Zetta Bills Mobility Specialist 05/20/22 10:08 AM

## 2022-05-20 NOTE — Discharge Summary (Signed)
Physician Discharge Summary  [name] ZOX:[id number] DOB: May 20, 1928 DOA: 05/10/2022  PCP: Gracelyn Nurse, MD  Admit date: 05/10/2022 Discharge date: 05/21/2022  Admitted From: Home Disposition:  Home with hospice  Recommendations for Outpatient Follow-up:  Per hospice providers   Home Health:NA  Equipment/Devices:Oxygen 4L via [place]   Discharge Condition:Hospice  CODE STATUS:DNR  Diet recommendation: Reg  Brief/Interim Summary: 89 y.o. male multiple medical problems including but not limited to recent left great toe amputation for osteomyelitis left great toe, chronic diastolic CHF, PVD, CAD, right carotid artery stenosis, aortic stenosis, right bundle branch block, history of COVID-19 infection, history of aspiration pneumonia, COPD, chronic hypoxic respiratory failure on 4 L oxygen, atrial fibrillation, anemia of chronic disease history was obtained from the patient and [name] (his daughter) at the bedside. EMR chart was also reviewed. He presented to the hospital because of increasing shortness of breath, easy fatigability, poor oral intake and constipation. He has not been feeling well since he had left great toe amputation on 05/03/2022. Because of easy fatigability and exertional shortness of breath he has mostly been sedentary. He has not moved his bowels for about 8 days. He has had a lot of nausea/dry heaving and vomiting mucus.     Discharge Diagnoses:  Principal Problem:   Community acquired pneumonia Active Problems:   Hypokalemia   Chronic diastolic CHF (congestive heart failure)   COPD (chronic obstructive pulmonary disease)   Chronic respiratory failure with hypoxia   Atrial fibrillation with RVR   Coronary artery disease   Constipation   Acute on chronic diastolic CHF (congestive heart failure)   PVD (peripheral vascular disease)   Elevated troponin   Gangrene of left foot   AKI (acute kidney injury)  PVD, recent left great toe osteomyelitis s/p left great  toe amputation on 05/03/2022: Wound culture showed Pseudomonas aeruginosa infection.  He was on ciprofloxacin and Augmentin at home (to be completed on 05/16/2022). Consulted Dr. Alberteen Spindle, podiatrist.  He said the recent left hallux amputation is not healing and foot is starting to appear gangrenous.  He had discussed this with Dr. Wyn Quaker, vascular surgeon, who said the only option would be to do a high level amputation.  Unfortunately, patient is at very high risk for complications for surgery.  Poor surgical candidate at this time.  Prognosis is poor without intervention.  Unfortunately options very limited at this time.  Recommend de-escalation of care, symptom management, hospice referral.  Palliative care to follow-up. Current plan of care is to discharge home with hospice services after daughter has time to arrange all DME delivery..  Hope to discharge by Tuesday 4/23.  Will prescribe 2 weeks of p.o. antibiotics at time of discharge  Left lower lobe pneumonia: No growth on blood cultures thus far.  Continue Levaquin and Flagyl.  7-day stop date in place.  Strep pneumo and Legionella urine antigens were negative.   Continue Solu-Medrol 40 mg every 12 hours IV until discharge.  Convert to prednisone 20 mg daily.  2-week course prescribed.     Acute on chronic hypoxic respiratory failure: Improved.  Back to baseline 4 L.  Steroids as above.  As needed bronchodilators.      Hypokalemia: Improved     Worsening AKI: Creatinine went up to 1.56.  Now improving.  Restarted home torsemide 4/20     Elevated troponins: No chest pain.  This is likely due to demand ischemia from infection.       Constipation: Improved.  He has  moved his bowels. No acute abnormality or bowel obstruction noted on CT abdomen.   Continue bowel regimen scheduled and as needed     Acute on chronic diastolic CHF: Chest ray done on 05/12/2022 showed increased bilateral pulmonary edema with right-sided pleural effusions.  Torsemide  restarted as above     COPD with chronic hypoxic respiratory failure: Was on IV steroids.  Prescribed 2 weeks of PO prednisone on dc     Atrial fibrillation with RVR: I recommend discontinuation of these medications on discharge.  I have discussed my recommendations with the patients daughter.  She is in agreement.  I have not stopped them on his discharge med rec.  Defer to hospice physician          Recent oral thrush prior to admission: Nystatin suspension has been reordered.  Discharge Instructions  Discharge Instructions     Diet - low sodium heart healthy   Complete by: As directed    Increase activity slowly   Complete by: As directed       Allergies as of 05/21/2022       Reactions   Bee Venom Anaphylaxis        Medication List     STOP taking these medications    atorvastatin 40 MG tablet Commonly known as: LIPITOR   Benefiber Prebiotic+Probiotic Chew   ciprofloxacin 500 MG tablet Commonly known as: CIPRO   magnesium oxide 400 (240 Mg) MG tablet Commonly known as: MAG-OX   metolazone 2.5 MG tablet Commonly known as: ZAROXOLYN   povidone-iodine 10 % external solution Commonly known as: BETADINE   traMADol 50 MG tablet Commonly known as: ULTRAM       TAKE these medications    acetaminophen 500 MG tablet Commonly known as: TYLENOL Take 500 mg by mouth every 6 (six) hours as needed for fever or pain.   albuterol (2.5 MG/3ML) 0.083% nebulizer solution Commonly known as: PROVENTIL Take 2.5 mg by nebulization 4 (four) times daily.   alendronate 70 MG tablet Commonly known as: FOSAMAX Take 70 mg by mouth once a week. Sunday morning with full glass of water and sit up do not lay down   amoxicillin-clavulanate 875-125 MG tablet Commonly known as: AUGMENTIN Take 1 tablet by mouth 2 (two) times daily for 14 days.   apixaban 2.5 MG Tabs tablet Commonly known as: ELIQUIS Take 2.5 mg by mouth 2 (two) times daily.   ascorbic acid 250 MG  tablet Commonly known as: VITAMIN C Take 1 tablet (250 mg total) by mouth 2 (two) times daily. What changed: when to take this   aspirin EC 81 MG tablet Take 1 tablet (81 mg total) by mouth daily at 6 (six) AM. Swallow whole.   CALCIUM 600/VITAMIN D PO Take 1 tablet by mouth daily.   Cartia XT 300 MG 24 hr capsule Generic drug: diltiazem Take 300 mg by mouth daily.   HYDROcodone-acetaminophen 5-325 MG tablet Commonly known as: NORCO/VICODIN Take 1 tablet by mouth every 4 (four) hours as needed for moderate pain.   metoprolol tartrate 50 MG tablet Commonly known as: LOPRESSOR Take 50 mg by mouth 2 (two) times daily.   multivitamin with minerals Tabs tablet Take 1 tablet by mouth daily.   nystatin 100000 UNIT/ML suspension Commonly known as: MYCOSTATIN Use as directed 5 mLs in the mouth or throat 4 (four) times daily.   OXYGEN Inhale 4 L into the lungs continuous.   potassium chloride 10 MEQ tablet Commonly known as: Jerene Dilling  Take 2 tablets (20 mEq total) by mouth daily. What changed:  how much to take additional instructions   predniSONE 20 MG tablet Commonly known as: DELTASONE Take 1 tablet (20 mg total) by mouth daily for 14 days. What changed:  medication strength how much to take when to take this   senna-docusate 8.6-50 MG tablet Commonly known as: Senokot-S Take 2 tablets by mouth 2 (two) times daily.   torsemide 20 MG tablet Commonly known as: DEMADEX Take  on M, W, F and  on Tu, Thur,Sat & Sunday What changed:  how much to take how to take this when to take this additional instructions   Trelegy Ellipta 100-62.5-25 MCG/ACT Aepb Generic drug: Fluticasone-Umeclidin-Vilant Inhale 1 puff into the lungs daily.   Vitamin D (Ergocalciferol) 1.25 MG (50000 UNIT) Caps capsule Commonly known as: DRISDOL Take 1 capsule (50,000 Units total) by mouth every 7 (seven) days. What changed: additional instructions        Follow-up Information      Gracelyn Nurse, MD.   Specialty: Internal Medicine Why: As needed Contact information: [address] [place] [address] [phone]                Allergies  Allergen Reactions   Bee Venom Anaphylaxis    Consultations: Podiatry   Procedures/Studies: DG Abd 2 Views  Result Date: 05/13/2022 CLINICAL DATA:  Abdominal distension EXAM: ABDOMEN - 2 VIEW COMPARISON:  05/10/2022 FINDINGS: Nonobstructive bowel gas pattern with mildly prominent loops of both large and small bowel. There is no evidence of free air. Bibasilar consolidations and effusions. Advanced atherosclerotic vascular calcifications. Osseous demineralization with multiple known compression fractures. IMPRESSION: 1. Nonobstructive bowel gas pattern. 2. Bibasilar consolidations and effusions. Electronically Signed   By: Duanne Guess D.O.   On: 05/13/2022 18:51   DG Chest Port 1 View  Result Date: 05/12/2022 CLINICAL DATA:  Acute respiratory failure with hypoxia. EXAM: PORTABLE CHEST 1 VIEW COMPARISON:  May 10, 2022. FINDINGS: Stable cardiomediastinal silhouette. Mildly increased diffuse interstitial densities are noted concerning for pulmonary edema. Bilateral pleural effusions are noted. Bony thorax is unremarkable. IMPRESSION: Increased bilateral pulmonary edema with associated pleural effusions. Electronically Signed   By: Lupita Raider M.D.   On: 05/12/2022 10:00   CT Angio Chest PE W and/or Wo Contrast  Result Date: 05/10/2022 CLINICAL DATA:  Pleural effusion on chest x-ray EXAM: CT ANGIOGRAPHY CHEST WITH CONTRAST TECHNIQUE: Multidetector CT imaging of the chest was performed using the standard protocol during bolus administration of intravenous contrast. Multiplanar CT image reconstructions and MIPs were obtained to evaluate the vascular anatomy. RADIATION DOSE REDUCTION: This exam was performed according to the departmental dose-optimization program which includes automated exposure  control, adjustment of the mA and/or kV according to patient size and/or use of iterative reconstruction technique. CONTRAST:  75mL OMNIPAQUE IOHEXOL 350 MG/ML SOLN COMPARISON:  Chest x-ray 05/10/2022, CT chest 10/08/2019 FINDINGS: Cardiovascular: Satisfactory opacification of the pulmonary arteries to the segmental level. No evidence of pulmonary embolism. Moderate severe aortic atherosclerosis. No aneurysm. Coronary vascular calcifications. Cardiomegaly. No pericardial effusion. Mediastinum/Nodes: Midline trachea. No thyroid mass. No suspicious lymph nodes. Esophagus within normal limits. Lungs/Pleura: Emphysema. Apical pleural and parenchymal scarring with calcifications, unchanged. Small bilateral pleural effusions. No pneumothorax. Mild airspace disease at the left lung base. Upper Abdomen: No acute finding Musculoskeletal: Chronic superior endplate fractures at T3 and T4. Chronic compression fractures at T5 and T7. Chronic L1 and L2 compression fractures. Old sternal fracture. Review of the  MIP images confirms the above findings. IMPRESSION: 1. Negative for acute pulmonary embolus. 2. Emphysema. Mild airspace disease at the left lung base which could be due to atelectasis pneumonia or scarring. 3. Cardiomegaly with small bilateral pleural effusions 4. Multiple chronic compression fractures of the thoracic and lumbar spine. 5. Aortic atherosclerosis. Aortic Atherosclerosis (ICD10-I70.0) and Emphysema (ICD10-J43.9). Electronically Signed   By: Jasmine Pang M.D.   On: 05/10/2022 17:17   CT ABDOMEN PELVIS W CONTRAST  Result Date: 05/10/2022 CLINICAL DATA:  Abdominal pain. EXAM: CT ABDOMEN AND PELVIS WITH CONTRAST TECHNIQUE: Multidetector CT imaging of the abdomen and pelvis was performed using the standard protocol following bolus administration of intravenous contrast. RADIATION DOSE REDUCTION: This exam was performed according to the departmental dose-optimization program which includes automated exposure  control, adjustment of the mA and/or kV according to patient size and/or use of iterative reconstruction technique. CONTRAST:  75mL OMNIPAQUE IOHEXOL 350 MG/ML SOLN COMPARISON:  None Available. FINDINGS: Lower chest: Advanced changes of emphysema noted in the lung bases. There is spiculated masslike consolidative opacity in the posterior left lower lobe with small left and tiny right pleural effusions. Hepatobiliary: No suspicious focal abnormality within the liver parenchyma. Mild intrahepatic biliary duct dilatation evident. Gallbladder is nondistended. Common bile duct in the head of the pancreas measures 8 mm diameter, upper normal for patient age. Pancreas: No focal mass lesion. No dilatation of the main duct. No intraparenchymal cyst. No peripancreatic edema. Spleen: No splenomegaly. No focal mass lesion. Adrenals/Urinary Tract: No adrenal nodule or mass. 3 mm nonobstructing stone identified interpolar right kidney with a 3 mm nonobstructing stone in the lower pole right kidney. No left-sided nephrolithiasis. Small bilateral low-density renal lesions are likely cysts. No followup imaging is recommended. No evidence for hydroureter. The urinary bladder appears normal for the degree of distention. Stomach/Bowel: Stomach is unremarkable. No gastric wall thickening. No evidence of outlet obstruction. Duodenum is normally positioned as is the ligament of Treitz. No small bowel wall thickening. No small bowel dilatation. The terminal ileum is normal. The appendix is normal. No gross colonic mass. No colonic wall thickening. No substantial diverticular disease. Vascular/Lymphatic: Status post aorto bi femoral bypass graft placement. Potential aneurysm/pseudoaneurysm in the right groin at the anastomosis. There is no gastrohepatic or hepatoduodenal ligament lymphadenopathy. No retroperitoneal or mesenteric lymphadenopathy. No pelvic sidewall lymphadenopathy. Reproductive: The prostate gland and seminal vesicles are  unremarkable. Other: No intraperitoneal free fluid. Musculoskeletal: Bones are diffusely demineralized. No worrisome lytic or sclerotic osseous abnormality. Compression fractures are seen at all levels from L1 through L5. IMPRESSION: 1. No acute findings in the abdomen or pelvis. 2. Irregular masslike consolidative opacity in the posterior left lower lobe is likely related to pneumonia although close follow-up recommended as neoplasm not excluded. Small left and tiny right pleural effusions. 3. Mild intrahepatic biliary duct dilatation. Common bile duct in the head of the pancreas measures 8 mm diameter, upper normal for patient age. Correlation with liver function test may prove helpful. 4. Nonobstructing right renal stones. 5. Status post aorto bi femoral bypass graft placement. Potential aneurysm/pseudoaneurysm in the right groin at the anastomosis. 6. Compression fracture involving all lumbar levels from L1-L5, stable since MRI 10/09/2019. Electronically Signed   By: Kennith Center M.D.   On: 05/10/2022 17:10   DG Chest Portable 1 View  Result Date: 05/10/2022 CLINICAL DATA:  Cough.  Some shortness of breath EXAM: PORTABLE CHEST 1 VIEW COMPARISON:  03/27/2022 and older studies.  CT, 10/08/2019. FINDINGS: Cardiac silhouette is  normal in size. No mediastinal or hilar masses. Bilateral interstitial thickening. Areas lung scarring most evident at the apices and bases. Blunting the lateral left costophrenic sulcus is consistent with a small effusion. No convincing pneumonia. No pneumothorax. Skeletal structures are grossly intact. IMPRESSION: 1. Predominantly chronic findings in the lungs including interstitial thickening and scarring. A component interstitial edema should be considered in the proper clinical setting. Small left effusion. No convincing pneumonia. Electronically Signed   By: Amie Portland M.D.   On: 05/10/2022 15:48   DG Abdomen 1 View  Result Date: 05/10/2022 CLINICAL DATA:  Constipation.  EXAM: ABDOMEN - 1 VIEW COMPARISON:  None Available. FINDINGS: Moderate fecal material throughout the colon. No evidence of small-bowel obstruction or gross signs of free intraperitoneal air. No abnormal calcifications. Visualized bony structures are grossly unremarkable. IMPRESSION: Moderate fecal material throughout the colon. Electronically Signed   By: Irish Lack M.D.   On: 05/10/2022 13:30      Subjective: Seen and examined at the time of discharge.  Remains dyspneic but otherwise stable.  Appropriate for discharge home with hospice services.  Discharge Exam: Vitals:   05/21/22 0808 05/21/22 0834  BP: 109/60   Pulse: (!) 54   Resp: 16   Temp: 97.6 F (36.4 C)   SpO2: (!) 84% 95%   Vitals:   05/21/22 0448 05/21/22 0700 05/21/22 0808 05/21/22 0834  BP: 130/84  109/60   Pulse: 89  (!) 54   Resp: 18  16   Temp: 97.6 F (36.4 C)  97.6 F (36.4 C)   TempSrc:   Oral   SpO2: 100% 100% (!) 84% 95%  Weight:      Height:        General: Pt is alert, awake, not in acute distress Cardiovascular: RRR, S1/S2 +, no rubs, no gallops Respiratory: CTA bilaterally, no wheezing, no rhonchi Abdominal: Soft, NT, ND, bowel sounds + Extremities: no edema, no cyanosis    The results of significant diagnostics from this hospitalization (including imaging, microbiology, ancillary and laboratory) are listed below for reference.     Microbiology: No results found for this or any previous visit (from the past 240 hour(s)).    Labs: BNP (last 3 results) Recent Labs    03/27/22 1340 05/10/22 1529  BNP 956.9* 997.2*   Basic Metabolic Panel: Recent Labs  Lab 05/15/22 0614 05/17/22 0421  NA 147* 151*  K 3.7 3.6  CL 110 112*  CO2 30 32  GLUCOSE 88 161*  BUN 45* 52*  CREATININE 1.35* 1.03  CALCIUM 8.5* 8.8*  MG 2.3  --    Liver Function Tests: No results for input(s): "AST", "ALT", "ALKPHOS", "BILITOT", "PROT", "ALBUMIN" in the last 168 hours. No results for input(s):  "LIPASE", "AMYLASE" in the last 168 hours. No results for input(s): "AMMONIA" in the last 168 hours. CBC: Recent Labs  Lab 05/15/22 0614 05/17/22 0421  WBC 13.3* 12.0*  NEUTROABS 10.5* 10.2*  HGB 8.9* 9.0*  HCT 29.0* 29.2*  MCV 95.4 95.4  PLT 362 396   Cardiac Enzymes: No results for input(s): "CKTOTAL", "CKMB", "CKMBINDEX", "TROPONINI" in the last 168 hours. BNP: Invalid input(s): "POCBNP" CBG: No results for input(s): "GLUCAP" in the last 168 hours. D-Dimer No results for input(s): "DDIMER" in the last 72 hours. Hgb A1c No results for input(s): "HGBA1C" in the last 72 hours. Lipid Profile No results for input(s): "CHOL", "HDL", "LDLCALC", "TRIG", "CHOLHDL", "LDLDIRECT" in the last 72 hours. Thyroid function studies No results for input(s): "TSH", "  T4TOTAL", "T3FREE", "THYROIDAB" in the last 72 hours.  Invalid input(s): "FREET3" Anemia work up No results for input(s): "VITAMINB12", "FOLATE", "FERRITIN", "TIBC", "IRON", "RETICCTPCT" in the last 72 hours. Urinalysis    Component Value Date/Time   COLORURINE STRAW (A) 05/10/2022 1530   APPEARANCEUR HAZY (A) 05/10/2022 1530   LABSPEC 1.005 05/10/2022 1530   PHURINE 6.0 05/10/2022 1530   GLUCOSEU NEGATIVE 05/10/2022 1530   HGBUR SMALL (A) 05/10/2022 1530   BILIRUBINUR NEGATIVE 05/10/2022 1530   KETONESUR NEGATIVE 05/10/2022 1530   PROTEINUR NEGATIVE 05/10/2022 1530   NITRITE NEGATIVE 05/10/2022 1530   LEUKOCYTESUR LARGE (A) 05/10/2022 1530   Sepsis Labs Recent Labs  Lab 05/15/22 0614 05/17/22 0421  WBC 13.3* 12.0*   Microbiology No results found for this or any previous visit (from the past 240 hour(s)).    Time coordinating discharge: Over 30 minutes  SIGNED:   Tresa Moore, MD  [facility] 05/21/2022, 8:50 AM Pager   If 7PM-7AM, please contact night-coverage

## 2022-05-20 NOTE — Progress Notes (Signed)
\ PROGRESS NOTE    [name]  [id number] PELLECCHIA6-Jun-1930 DOA: 05/10/2022 PCP: Gracelyn Nurse, MD    Brief Narrative:  89 y.o. male multiple medical problems including but not limited to recent left great toe amputation for osteomyelitis left great toe, chronic diastolic CHF, PVD, CAD, right carotid artery stenosis, aortic stenosis, right bundle branch block, history of COVID-19 infection, history of aspiration pneumonia, COPD, chronic hypoxic respiratory failure on 4 L oxygen, atrial fibrillation, anemia of chronic disease history was obtained from the patient and Christopher Endo (his daughter) at the bedside.  EMR chart was also reviewed.  He presented to the hospital because of increasing shortness of breath, easy fatigability, poor oral intake and constipation.  He has not been feeling well since he had left great toe amputation on 05/03/2022.  Because of easy fatigability and exertional shortness of breath he has mostly been sedentary.  He has not moved his bowels for about 8 days.  He has had a lot of nausea/dry heaving and vomiting mucus.    Assessment & Plan:   Principal Problem:   Community acquired pneumonia Active Problems:   Hypokalemia   Chronic diastolic CHF (congestive heart failure)   COPD (chronic obstructive pulmonary disease)   Chronic respiratory failure with hypoxia   Atrial fibrillation with RVR   Coronary artery disease   Constipation   Acute on chronic diastolic CHF (congestive heart failure)   PVD (peripheral vascular disease)   Elevated troponin   Gangrene of left foot   AKI (acute kidney injury)  Left lower lobe pneumonia: No growth on blood cultures thus far.  Continue Levaquin and Flagyl.  7-day stop date in place.  Strep pneumo and Legionella urine antigens were negative.   Continue Solu-Medrol 40 mg every 12 hours IV until discharge.  Convert to prednisone 20 mg daily.  2-week course prescribed.     Acute on chronic hypoxic respiratory failure: Improved.   Back to baseline 4 L.  Steroids as above.  As needed bronchodilators.      Hypokalemia: Improved     Worsening AKI: Creatinine went up to 1.56.  Now improving.  Restarted home torsemide 4/20     Elevated troponins: No chest pain.  This is likely due to demand ischemia from infection.       Constipation: Improved.  He has moved his bowels. No acute abnormality or bowel obstruction noted on CT abdomen.   Continue bowel regimen scheduled and as needed     Acute on chronic diastolic CHF: Chest ray done on 05/12/2022 showed increased bilateral pulmonary edema with right-sided pleural effusions.  Torsemide restarted as above     COPD with chronic hypoxic respiratory failure: On IV Solu-Medrol..  Hold home prednisone.  Continue bronchodilator regimen.     Atrial fibrillation with RVR: Continue metoprolol, Cardizem and Eliquis     PVD, recent left great toe osteomyelitis s/p left great toe amputation on 05/03/2022: Wound culture showed Pseudomonas aeruginosa infection.  He was on ciprofloxacin and Augmentin at home (to be completed on 05/16/2022). Consulted Dr. Alberteen Spindle, podiatrist.  He said the recent left hallux amputation is not healing and foot is starting to appear gangrenous.  He had discussed this with Dr. Wyn Quaker, vascular surgeon, who said the only option would be to do a high level amputation.  Unfortunately, patient is at very high risk for complications for surgery.  Poor surgical candidate at this time.  Prognosis is poor without intervention.  Unfortunately options very limited at this  time.  Recommend de-escalation of care, symptom management, hospice referral.  Palliative care to follow-up. Current plan of care is to discharge home with hospice services after daughter has time to arrange all DME delivery..  Hope to discharge by Tuesday 4/23.  Will prescribe 2 weeks of p.o. antibiotics at time of discharge     Recent oral thrush prior to admission: Nystatin suspension has been  reordered.   DVT prophylaxis: Eliquis Code Status: DNR Family Communication: Daughter Christopher Endo [phone] via phone 4/17, 4/18, 4/20 Disposition Plan: Status is: Inpatient Remains inpatient appropriate because: Gangrene of foot.  Community-acquired pneumonia   Level of care: Med-Surg  Consultants:  Palliative care  Procedures:  None  Antimicrobials: Levaquin Flagyl   Subjective: Seen and examined.  Sitting up in bed.  Mentating clearly.  Appears short of breath Objective: Vitals:   05/19/22 0900 05/19/22 1537 05/19/22 2246 05/20/22 0837  BP: (!) 118/55 120/62 139/78 117/64  Pulse: 88 83 78 99  Resp: Temp: (!) 97.3 F (36.3 C) (!) 97.1 F (36.2 C) 98.7 F (37.1 C) 97.6 F (36.4 C)  TempSrc: Oral     SpO2:  92% 100% 91%  Weight:      Height:        Intake/Output Summary (Last 24 hours) at 05/20/2022 1124 Last data filed at 05/19/2022 1720 Gross per 24 hour  Intake --  Output 1600 ml  Net -1600 ml   Filed Weights   05/10/22 1251  Weight: 64.4 kg    Examination:  General exam: Appears fatigued.  Appears dyspneic.  Appears frail Respiratory system: Appears dyspneic.  Coarse breath sounds.  Conversational dyspnea.  4 L Cardiovascular system: S1-S2, RRR, no murmurs, no pedal edema Gastrointestinal system: Thin, soft, NT/ND, normal bowel sounds Central nervous system: Alert and oriented. No focal neurological deficits. Extremities: Symmetrically decreased bilaterally Skin: Right foot status post hallux amputation.  Significant discoloration of foot. Psychiatry: Judgement and insight appear normal. Mood & affect appropriate.     Data Reviewed: I have personally reviewed following labs and imaging studies  CBC: Recent Labs  Lab 05/14/22 0502 05/15/22 0614 05/17/22 0421  WBC 15.2* 13.3* 12.0*  NEUTROABS 12.4* 10.5* 10.2*  HGB 9.2* 8.9* 9.0*  HCT 29.7* 29.0* 29.2*  MCV 94.9 95.4 95.4  PLT 372 362 396   Basic Metabolic Panel: Recent Labs   Lab 05/14/22 0502 05/15/22 0614 05/17/22 0421  NA 150* 147* 151*  K 4.0 3.7 3.6  CL 108 110 112*  CO2 33* 30 32  GLUCOSE 110* 88 161*  BUN 43* 45* 52*  CREATININE 1.56* 1.35* 1.03  CALCIUM 8.4* 8.5* 8.8*  MG 2.2 2.3  --    GFR: Estimated Creatinine Clearance: 42.6 mL/min (by C-G formula based on SCr of 1.03 mg/dL). Liver Function Tests: No results for input(s): "AST", "ALT", "ALKPHOS", "BILITOT", "PROT", "ALBUMIN" in the last 168 hours.  No results for input(s): "LIPASE", "AMYLASE" in the last 168 hours. No results for input(s): "AMMONIA" in the last 168 hours. Coagulation Profile: No results for input(s): "INR", "PROTIME" in the last 168 hours. Cardiac Enzymes: No results for input(s): "CKTOTAL", "CKMB", "CKMBINDEX", "TROPONINI" in the last 168 hours. BNP (last 3 results) No results for input(s): "PROBNP" in the last 8760 hours. HbA1C: No results for input(s): "HGBA1C" in the last 72 hours. CBG: No results for input(s): "GLUCAP" in the last 168 hours.  Lipid Profile: No results for input(s): "CHOL", "HDL", "LDLCALC", "TRIG", "CHOLHDL", "LDLDIRECT" in the last 72  hours. Thyroid Function Tests: No results for input(s): "TSH", "T4TOTAL", "FREET4", "T3FREE", "THYROIDAB" in the last 72 hours. Anemia Panel: No results for input(s): "VITAMINB12", "FOLATE", "FERRITIN", "TIBC", "IRON", "RETICCTPCT" in the last 72 hours. Sepsis Labs: No results for input(s): "PROCALCITON", "LATICACIDVEN" in the last 168 hours.   Recent Results (from the past 240 hour(s))  Culture, blood (Routine X 2) w Reflex to ID Panel     Status: None   Collection Time: 05/10/22  8:45 PM   Specimen: BLOOD  Result Value Ref Range Status   Specimen Description BLOOD BLOOD LEFT HAND  Final   Special Requests   Final    BOTTLES DRAWN AEROBIC AND ANAEROBIC Blood Culture results may not be optimal due to an inadequate volume of blood received in culture bottles   Culture   Final    NO GROWTH 5 DAYS Performed  at [facility], [address] [place], [place] [address]    Report Status 05/15/2022 FINAL  Final  Culture, blood (Routine X 2) w Reflex to ID Panel     Status: None   Collection Time: 05/10/22  8:45 PM   Specimen: BLOOD  Result Value Ref Range Status   Specimen Description BLOOD RIGHT ANTECUBITAL  Final   Special Requests   Final    BOTTLES DRAWN AEROBIC AND ANAEROBIC Blood Culture adequate volume   Culture   Final    NO GROWTH 5 DAYS Performed at [facility], [address], [place], [place] [address]    Report Status 05/15/2022 FINAL  Final         Radiology Studies: No results found.      Scheduled Meds:  apixaban  2.5 mg Oral BID   aspirin EC  81 mg Oral Q0600   atorvastatin  40 mg Oral QHS   diltiazem  300 mg Oral Daily   feeding supplement  237 mL Oral BID BM   fluticasone furoate-vilanterol  1 puff Inhalation Daily   And   umeclidinium bromide  1 puff Inhalation Daily   guaiFENesin  1,200 mg Oral BID   ipratropium-albuterol  3 mL Nebulization TID   methylPREDNISolone (SOLU-MEDROL) injection  40 mg Intravenous Q12H   metoprolol tartrate  50 mg Oral BID   multivitamin with minerals  1 tablet Oral Daily   nystatin  5 mL Mouth/Throat QID   polyethylene glycol  17 g Oral Daily   senna-docusate  2 tablet Oral BID   torsemide  20 mg Oral Daily   Vitamin D (Ergocalciferol)  50,000 Units Oral Q7 days   Continuous Infusions:   LOS: 9 days     Christopher Moore, MD [facility]   If 7PM-7AM, please contact night-coverage  05/20/2022, 11:24 AM

## 2022-05-20 NOTE — Progress Notes (Signed)
Insurance claims handler Liaison Note  Follow up on new referral for hospice services at home upon discharge from [facility]. Spoke with [name] via phone this morning to confirm DME delivery for today.  She is prepared for patient to discharge tomorrow via EMS.  Hospice admission visit set for 2pm tomorrow afternoon.  Patient will need to be home by then in order for same day admission.  Notified team at [facility] and we will plan for discharge with EMS to be scheduled for 1000 pick up tomorrow morning.  Daughter did ask if patient's RN call her when patient leaves the hospital so she ensures she is home when he arrives.  She would also like discharge instructions given to her via phone.  Team at [facility] notified.  Continued collaboration with patient/family and [facility] team ongoing through final disposition.  Thank you for allowing participation in this patient's care.  Norris Cross, RN [phone]

## 2022-05-21 MED ORDER — HYDROCODONE-ACETAMINOPHEN 5-325 MG PO TABS: 1.0000 | ORAL_TABLET | ORAL | 0 refills | Status: DC | PRN

## 2022-05-21 MED ORDER — PREDNISONE 20 MG PO TABS: 20.0000 mg | ORAL_TABLET | Freq: Every day | ORAL | 0 refills | Status: AC

## 2022-05-21 MED ORDER — AMOXICILLIN-POT CLAVULANATE 875-125 MG PO TABS: 1.0000 | ORAL_TABLET | Freq: Two times a day (BID) | ORAL | 0 refills | Status: AC

## 2022-05-21 NOTE — Progress Notes (Signed)
EMS has been called to transport at 10am.  Bedside RN will call the daughter when EMS arrives.  AV visit scheduled for 2pm today.    Thank you for the opportunity to participate in this patient's care.    Redge Gainer, [facility] Liaison [phone]

## 2022-05-21 NOTE — TOC Transition Note (Signed)
Transition of Care [facility]) - CM/SW Discharge Note   Patient Details  Name: [name] MRN: [id number] Date of Birth: [birth year]  [facility] CM/SW Contact:  Allena Katz, LCSW Phone Number: 05/21/2022, 8:59 AM   Clinical Narrative:  Pt discharging home with [facility] home hospice. Transport scheduled for 10 via [facility]. Daughter notified by RN. Signed DNR on chart.  CSW signing off.           Patient Goals and CMS Choice      Discharge Placement                         Discharge Plan and Services Additional resources added to the After Visit Summary for                                       Social Determinants of Health (SDOH) Interventions SDOH Screenings   Food Insecurity: No Food Insecurity (05/10/2022)  Housing: Low Risk  (05/10/2022)  Transportation Needs: No Transportation Needs (05/10/2022)  Utilities: Not At Risk (05/10/2022)  Tobacco Use: Medium Risk (05/10/2022)     Readmission Risk Interventions    10/10/2019    4:22 PM  Readmission Risk Prevention Plan  Transportation Screening Complete  PCP or Specialist Appt within 3-5 Days Complete  HRI or Home Care Consult Complete  Social Work Consult for Recovery Care Planning/Counseling Complete  Palliative Care Screening Complete  Medication Review [employer]) Complete

## 2022-05-29 DEATH — deceased

## 2022-06-10 ENCOUNTER — Encounter: Payer: Medicare Other | Admitting: Family

## 2022-07-25 ENCOUNTER — Encounter: Payer: Medicare Other | Admitting: Dermatology

## 2022-11-08 IMAGING — DX DG CHEST 1V PORT
1 series · 1 of 1 positions shown · non-contrast
Comparison: Radiograph 10/12/2019, CT 10/08/2019

CLINICAL DATA: COPD

EXAM:
PORTABLE CHEST 1 VIEW

[chest ap]
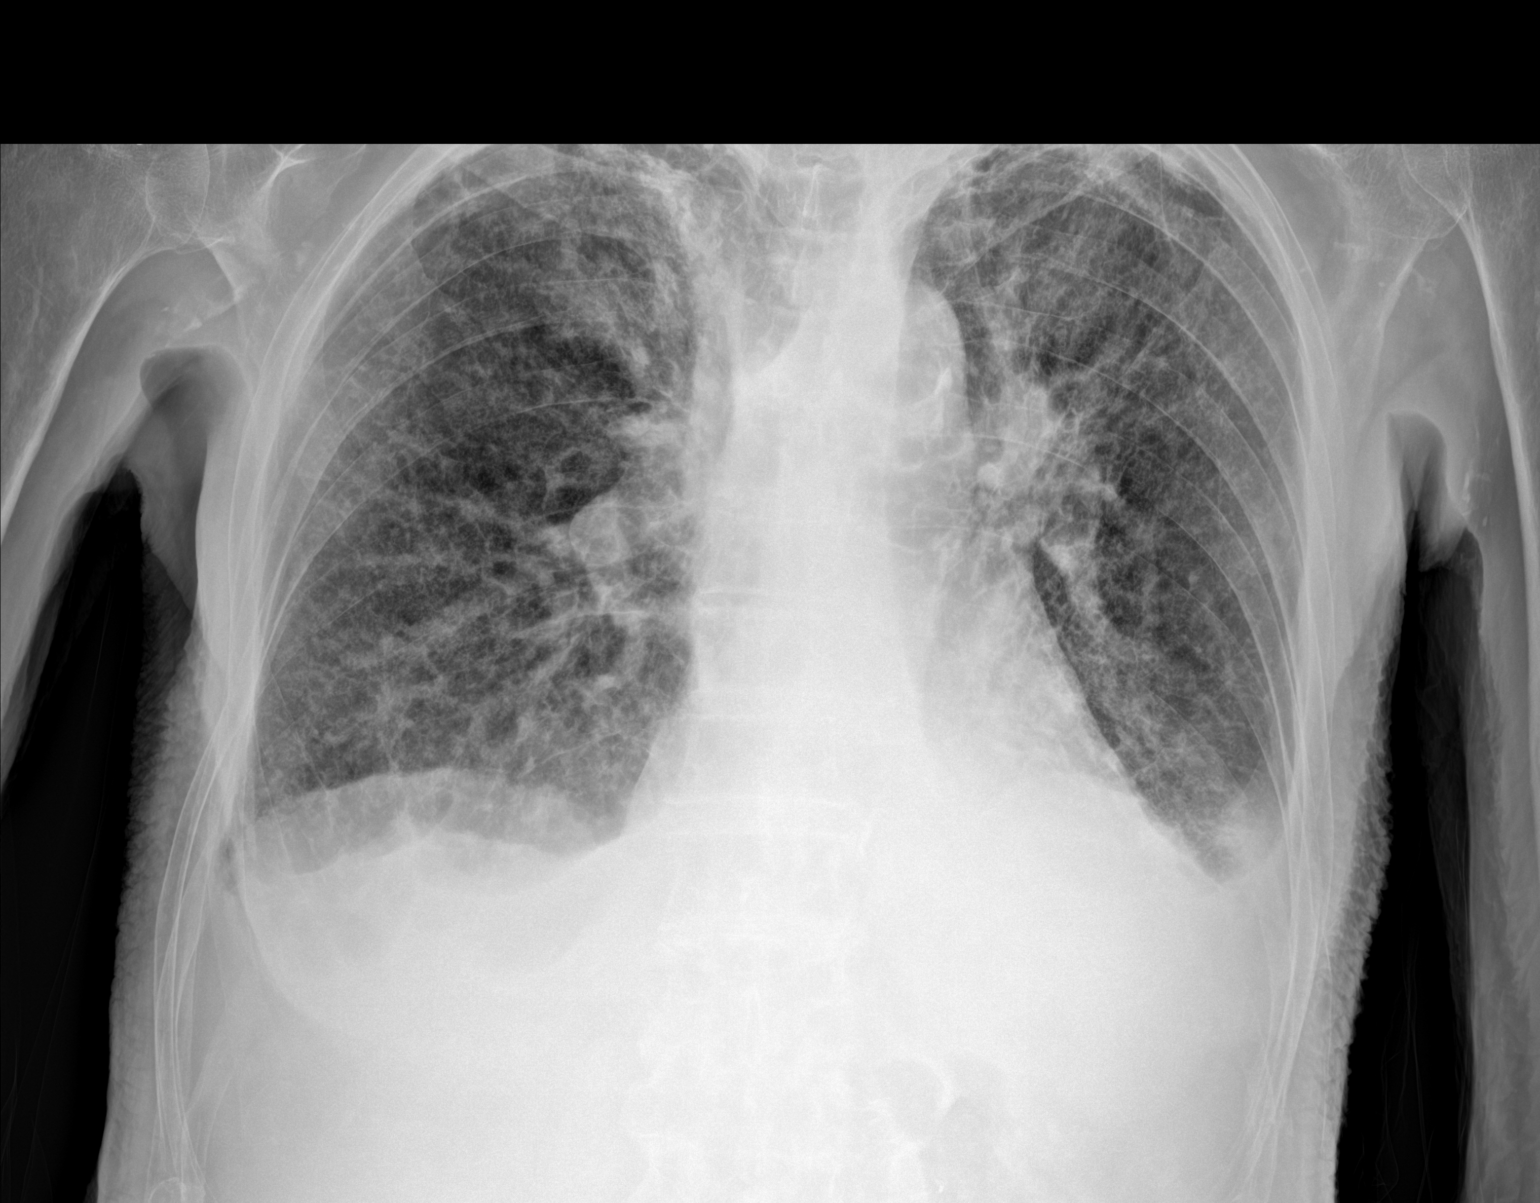

[1 of 1 positions shown; findings below may reference images not displayed]

FINDINGS: Normal cardiac silhouette. There is bilateral small effusions.
Potential nodular density at the LEFT lung base measuring 16 mm.

There is extensive interstitial lung disease similar prior. Biapical
calcified pleuroparenchymal scarring again noted.
IMPRESSION: 1. Chronic interstitial lung disease.
2. Increase in bilateral pleural effusions.
3. Chronic apical pleuroparenchymal thickening with calcifications.
4. Potential new nodule at the LEFT lung base. Recommend non
emergent CT of the thorax for further evaluation.
